# Patient Record
Sex: Female | Born: 1940 | Race: White | Hispanic: No | Marital: Married | Smoking: Never smoker
Health system: Southern US, Community
[De-identification: ages and names within clinical notes are randomized; demographics above are authoritative.]

## PROBLEM LIST (undated history)

## (undated) ENCOUNTER — Ambulatory Visit: Discharge status: Absent without leave | Payer: PPO

## (undated) DIAGNOSIS — E7800 Pure hypercholesterolemia, unspecified: Secondary | ICD-10-CM

## (undated) DIAGNOSIS — I739 Peripheral vascular disease, unspecified: Secondary | ICD-10-CM

## (undated) DIAGNOSIS — I779 Disorder of arteries and arterioles, unspecified: Secondary | ICD-10-CM

## (undated) DIAGNOSIS — E041 Nontoxic single thyroid nodule: Secondary | ICD-10-CM

## (undated) DIAGNOSIS — K219 Gastro-esophageal reflux disease without esophagitis: Secondary | ICD-10-CM

## (undated) DIAGNOSIS — I2510 Atherosclerotic heart disease of native coronary artery without angina pectoris: Secondary | ICD-10-CM

## (undated) DIAGNOSIS — C439 Malignant melanoma of skin, unspecified: Secondary | ICD-10-CM

## (undated) DIAGNOSIS — N3010 Interstitial cystitis (chronic) without hematuria: Secondary | ICD-10-CM

## (undated) DIAGNOSIS — Z87442 Personal history of urinary calculi: Secondary | ICD-10-CM

## (undated) DIAGNOSIS — R9431 Abnormal electrocardiogram [ECG] [EKG]: Secondary | ICD-10-CM

## (undated) DIAGNOSIS — R9430 Abnormal result of cardiovascular function study, unspecified: Secondary | ICD-10-CM

## (undated) DIAGNOSIS — K5730 Diverticulosis of large intestine without perforation or abscess without bleeding: Secondary | ICD-10-CM

## (undated) DIAGNOSIS — R51 Headache: Secondary | ICD-10-CM

## (undated) DIAGNOSIS — R011 Cardiac murmur, unspecified: Secondary | ICD-10-CM

## (undated) DIAGNOSIS — M1990 Unspecified osteoarthritis, unspecified site: Secondary | ICD-10-CM

## (undated) DIAGNOSIS — G4733 Obstructive sleep apnea (adult) (pediatric): Secondary | ICD-10-CM

## (undated) DIAGNOSIS — I059 Rheumatic mitral valve disease, unspecified: Secondary | ICD-10-CM

## (undated) DIAGNOSIS — N879 Dysplasia of cervix uteri, unspecified: Secondary | ICD-10-CM

## (undated) DIAGNOSIS — I350 Nonrheumatic aortic (valve) stenosis: Secondary | ICD-10-CM

## (undated) DIAGNOSIS — L900 Lichen sclerosus et atrophicus: Secondary | ICD-10-CM

## (undated) DIAGNOSIS — G43909 Migraine, unspecified, not intractable, without status migrainosus: Secondary | ICD-10-CM

## (undated) DIAGNOSIS — J411 Mucopurulent chronic bronchitis: Secondary | ICD-10-CM

## (undated) DIAGNOSIS — N200 Calculus of kidney: Secondary | ICD-10-CM

## (undated) DIAGNOSIS — J189 Pneumonia, unspecified organism: Secondary | ICD-10-CM

## (undated) DIAGNOSIS — R079 Chest pain, unspecified: Secondary | ICD-10-CM

## (undated) DIAGNOSIS — I872 Venous insufficiency (chronic) (peripheral): Secondary | ICD-10-CM

## (undated) DIAGNOSIS — I10 Essential (primary) hypertension: Secondary | ICD-10-CM

## (undated) DIAGNOSIS — I4891 Unspecified atrial fibrillation: Secondary | ICD-10-CM

## (undated) HISTORY — DX: Pure hypercholesterolemia, unspecified: E78.00

## (undated) HISTORY — PX: CARDIAC CATHETERIZATION: SHX172

## (undated) HISTORY — PX: NASAL SEPTUM SURGERY: SHX37

## (undated) HISTORY — DX: Chest pain, unspecified: R07.9

## (undated) HISTORY — DX: Dysplasia of cervix uteri, unspecified: N87.9

## (undated) HISTORY — DX: Rheumatic mitral valve disease, unspecified: I05.9

## (undated) HISTORY — DX: Mucopurulent chronic bronchitis: J41.1

## (undated) HISTORY — DX: Cardiac murmur, unspecified: R01.1

## (undated) HISTORY — DX: Essential (primary) hypertension: I10

## (undated) HISTORY — DX: Interstitial cystitis (chronic) without hematuria: N30.10

## (undated) HISTORY — DX: Disorder of arteries and arterioles, unspecified: I77.9

## (undated) HISTORY — DX: Calculus of kidney: N20.0

## (undated) HISTORY — DX: Migraine, unspecified, not intractable, without status migrainosus: G43.909

## (undated) HISTORY — DX: Nonrheumatic aortic (valve) stenosis: I35.0

## (undated) HISTORY — DX: Abnormal electrocardiogram (ECG) (EKG): R94.31

## (undated) HISTORY — DX: Malignant melanoma of skin, unspecified: C43.9

## (undated) HISTORY — DX: Venous insufficiency (chronic) (peripheral): I87.2

## (undated) HISTORY — PX: BREAST CYST ASPIRATION: SHX578

## (undated) HISTORY — DX: Unspecified osteoarthritis, unspecified site: M19.90

## (undated) HISTORY — DX: Lichen sclerosus et atrophicus: L90.0

## (undated) HISTORY — DX: Diverticulosis of large intestine without perforation or abscess without bleeding: K57.30

## (undated) HISTORY — DX: Unspecified atrial fibrillation: I48.91

## (undated) HISTORY — DX: Nontoxic single thyroid nodule: E04.1

## (undated) HISTORY — DX: Obstructive sleep apnea (adult) (pediatric): G47.33

## (undated) HISTORY — DX: Abnormal result of cardiovascular function study, unspecified: R94.30

## (undated) HISTORY — PX: CATARACT EXTRACTION, BILATERAL: SHX1313

## (undated) HISTORY — DX: Gastro-esophageal reflux disease without esophagitis: K21.9

## (undated) HISTORY — DX: Peripheral vascular disease, unspecified: I73.9

## (undated) HISTORY — DX: Headache: R51

---

## 1969-11-04 DIAGNOSIS — N879 Dysplasia of cervix uteri, unspecified: Secondary | ICD-10-CM

## 1969-11-04 HISTORY — PX: GYNECOLOGIC CRYOSURGERY: SHX857

## 1969-11-04 HISTORY — DX: Dysplasia of cervix uteri, unspecified: N87.9

## 1999-04-10 ENCOUNTER — Other Ambulatory Visit: Admission: RE | Admit: 1999-04-10 | Discharge: 1999-04-10 | Payer: Self-pay | Admitting: Gynecology

## 2000-07-08 ENCOUNTER — Other Ambulatory Visit: Admission: RE | Admit: 2000-07-08 | Discharge: 2000-07-08 | Payer: Self-pay | Admitting: Gynecology

## 2001-07-27 ENCOUNTER — Other Ambulatory Visit: Admission: RE | Admit: 2001-07-27 | Discharge: 2001-07-27 | Payer: Self-pay | Admitting: Gynecology

## 2002-06-03 ENCOUNTER — Encounter: Payer: Self-pay | Admitting: Pulmonary Disease

## 2002-06-03 ENCOUNTER — Ambulatory Visit (HOSPITAL_COMMUNITY): Admission: RE | Admit: 2002-06-03 | Discharge: 2002-06-03 | Payer: Self-pay | Admitting: Pulmonary Disease

## 2002-11-04 HISTORY — PX: OTHER SURGICAL HISTORY: SHX169

## 2002-11-08 ENCOUNTER — Other Ambulatory Visit: Admission: RE | Admit: 2002-11-08 | Discharge: 2002-11-08 | Payer: Self-pay | Admitting: Gynecology

## 2002-12-09 ENCOUNTER — Encounter: Payer: Self-pay | Admitting: Orthopedic Surgery

## 2002-12-16 ENCOUNTER — Inpatient Hospital Stay (HOSPITAL_COMMUNITY): Admission: RE | Admit: 2002-12-16 | Discharge: 2002-12-20 | Payer: Self-pay | Admitting: Orthopedic Surgery

## 2002-12-16 ENCOUNTER — Encounter: Payer: Self-pay | Admitting: Orthopedic Surgery

## 2004-03-05 ENCOUNTER — Other Ambulatory Visit: Admission: RE | Admit: 2004-03-05 | Discharge: 2004-03-05 | Payer: Self-pay | Admitting: Gynecology

## 2004-07-08 ENCOUNTER — Encounter: Payer: Self-pay | Admitting: Cardiology

## 2004-10-02 ENCOUNTER — Ambulatory Visit: Payer: Self-pay | Admitting: Pulmonary Disease

## 2004-10-05 ENCOUNTER — Ambulatory Visit: Payer: Self-pay | Admitting: Pulmonary Disease

## 2004-10-10 ENCOUNTER — Ambulatory Visit: Payer: Self-pay | Admitting: Pulmonary Disease

## 2004-10-19 ENCOUNTER — Ambulatory Visit: Payer: Self-pay | Admitting: Pulmonary Disease

## 2004-11-04 DIAGNOSIS — C439 Malignant melanoma of skin, unspecified: Secondary | ICD-10-CM

## 2004-11-04 HISTORY — DX: Malignant melanoma of skin, unspecified: C43.9

## 2004-11-04 HISTORY — PX: OTHER SURGICAL HISTORY: SHX169

## 2004-11-06 ENCOUNTER — Ambulatory Visit: Payer: Self-pay | Admitting: Adult Health

## 2004-11-14 ENCOUNTER — Ambulatory Visit: Payer: Self-pay | Admitting: Pulmonary Disease

## 2005-01-07 ENCOUNTER — Ambulatory Visit: Payer: Self-pay | Admitting: Pulmonary Disease

## 2005-03-12 ENCOUNTER — Other Ambulatory Visit: Admission: RE | Admit: 2005-03-12 | Discharge: 2005-03-12 | Payer: Self-pay | Admitting: Gynecology

## 2005-05-27 ENCOUNTER — Ambulatory Visit: Payer: Self-pay | Admitting: Pulmonary Disease

## 2005-08-28 ENCOUNTER — Ambulatory Visit: Payer: Self-pay | Admitting: Pulmonary Disease

## 2005-11-11 ENCOUNTER — Ambulatory Visit: Payer: Self-pay | Admitting: Pulmonary Disease

## 2005-12-27 ENCOUNTER — Ambulatory Visit: Payer: Self-pay | Admitting: Pulmonary Disease

## 2006-01-03 ENCOUNTER — Ambulatory Visit: Payer: Self-pay | Admitting: Pulmonary Disease

## 2006-02-02 HISTORY — PX: OTHER SURGICAL HISTORY: SHX169

## 2006-02-21 ENCOUNTER — Ambulatory Visit (HOSPITAL_BASED_OUTPATIENT_CLINIC_OR_DEPARTMENT_OTHER): Admission: RE | Admit: 2006-02-21 | Discharge: 2006-02-21 | Payer: Self-pay | Admitting: Urology

## 2006-04-14 ENCOUNTER — Ambulatory Visit: Payer: Self-pay | Admitting: Gastroenterology

## 2006-04-29 ENCOUNTER — Ambulatory Visit: Payer: Self-pay | Admitting: Gastroenterology

## 2006-04-29 ENCOUNTER — Encounter (INDEPENDENT_AMBULATORY_CARE_PROVIDER_SITE_OTHER): Payer: Self-pay | Admitting: Specialist

## 2006-06-24 ENCOUNTER — Other Ambulatory Visit: Admission: RE | Admit: 2006-06-24 | Discharge: 2006-06-24 | Payer: Self-pay | Admitting: Gynecology

## 2006-06-25 ENCOUNTER — Ambulatory Visit: Payer: Self-pay | Admitting: Pulmonary Disease

## 2006-07-28 ENCOUNTER — Ambulatory Visit: Payer: Self-pay | Admitting: Gastroenterology

## 2006-08-19 ENCOUNTER — Ambulatory Visit: Payer: Self-pay | Admitting: Pulmonary Disease

## 2006-12-04 ENCOUNTER — Ambulatory Visit: Payer: Self-pay | Admitting: Pulmonary Disease

## 2007-01-05 ENCOUNTER — Ambulatory Visit: Payer: Self-pay | Admitting: Pulmonary Disease

## 2007-01-20 ENCOUNTER — Encounter: Admission: RE | Admit: 2007-01-20 | Discharge: 2007-01-20 | Payer: Self-pay | Admitting: Orthopedic Surgery

## 2007-01-29 ENCOUNTER — Ambulatory Visit: Payer: Self-pay | Admitting: Pulmonary Disease

## 2007-01-29 LAB — CONVERTED CEMR LAB
AST: 20 units/L (ref 0–37)
Albumin: 3.7 g/dL (ref 3.5–5.2)
BUN: 18 mg/dL (ref 6–23)
Basophils Absolute: 0.1 10*3/uL (ref 0.0–0.1)
Chloride: 104 meq/L (ref 96–112)
Cholesterol: 268 mg/dL (ref 0–200)
Direct LDL: 167.5 mg/dL
Eosinophils Absolute: 0.2 10*3/uL (ref 0.0–0.6)
GFR calc non Af Amer: 89 mL/min
HCT: 40.8 % (ref 36.0–46.0)
HDL: 95.5 mg/dL (ref >39.0)
MCHC: 34.3 g/dL (ref 30.0–36.0)
MCV: 87.3 fL (ref 78.0–100.0)
Monocytes Relative: 6.9 % (ref 3.0–11.0)
Neutrophils Relative %: 63.5 % (ref 43.0–77.0)
Platelets: 298 10*3/uL (ref 150–400)
Potassium: 4.6 meq/L (ref 3.5–5.1)
RBC: 4.67 M/uL (ref 3.87–5.11)
Sodium: 141 meq/L (ref 135–145)
TSH: 2.07 microintl units/mL (ref 0.35–5.50)
Triglycerides: 35 mg/dL (ref 0–149)

## 2007-03-16 ENCOUNTER — Ambulatory Visit: Payer: Self-pay | Admitting: Gastroenterology

## 2007-03-18 ENCOUNTER — Ambulatory Visit (HOSPITAL_COMMUNITY): Admission: RE | Admit: 2007-03-18 | Discharge: 2007-03-18 | Payer: Self-pay | Admitting: Gastroenterology

## 2007-04-07 ENCOUNTER — Ambulatory Visit: Payer: Self-pay | Admitting: Gastroenterology

## 2007-07-07 LAB — CONVERTED CEMR LAB
Bilirubin, Direct: 0.1 mg/dL (ref 0.0–0.3)
CO2: 29 meq/L (ref 19–32)
GFR calc Af Amer: 107 mL/min
Glucose, Bld: 109 mg/dL — ABNORMAL HIGH (ref 70–99)
HDL: 73.3 mg/dL (ref >39.0)
Potassium: 4.5 meq/L (ref 3.5–5.1)
Total Protein: 6.7 g/dL (ref 6.0–8.3)
Triglycerides: 37 mg/dL (ref 0–149)

## 2007-07-20 ENCOUNTER — Ambulatory Visit: Payer: Self-pay | Admitting: Pulmonary Disease

## 2007-07-20 LAB — CONVERTED CEMR LAB
ALT: 30 units/L (ref 0–35)
Alkaline Phosphatase: 68 units/L (ref 39–117)
CO2: 29 meq/L (ref 19–32)
Cholesterol: 212 mg/dL (ref 0–200)
GFR calc Af Amer: 107 mL/min
GFR calc non Af Amer: 89 mL/min
HDL: 73.3 mg/dL (ref >39.0)
Potassium: 4.5 meq/L (ref 3.5–5.1)
Total Protein: 6.7 g/dL (ref 6.0–8.3)

## 2007-07-23 ENCOUNTER — Ambulatory Visit: Payer: Self-pay | Admitting: Pulmonary Disease

## 2007-07-29 ENCOUNTER — Encounter: Payer: Self-pay | Admitting: Pulmonary Disease

## 2007-07-29 ENCOUNTER — Ambulatory Visit: Payer: Self-pay | Admitting: Internal Medicine

## 2007-09-09 ENCOUNTER — Ambulatory Visit: Payer: Self-pay | Admitting: Pulmonary Disease

## 2007-09-10 ENCOUNTER — Other Ambulatory Visit: Admission: RE | Admit: 2007-09-10 | Discharge: 2007-09-10 | Payer: Self-pay | Admitting: Gynecology

## 2008-02-09 DIAGNOSIS — E7800 Pure hypercholesterolemia, unspecified: Secondary | ICD-10-CM | POA: Insufficient documentation

## 2008-02-09 DIAGNOSIS — K219 Gastro-esophageal reflux disease without esophagitis: Secondary | ICD-10-CM | POA: Insufficient documentation

## 2008-02-09 DIAGNOSIS — M810 Age-related osteoporosis without current pathological fracture: Secondary | ICD-10-CM | POA: Insufficient documentation

## 2008-02-09 DIAGNOSIS — I10 Essential (primary) hypertension: Secondary | ICD-10-CM | POA: Insufficient documentation

## 2008-02-10 ENCOUNTER — Ambulatory Visit: Payer: Self-pay | Admitting: Pulmonary Disease

## 2008-02-10 DIAGNOSIS — J309 Allergic rhinitis, unspecified: Secondary | ICD-10-CM | POA: Insufficient documentation

## 2008-02-10 DIAGNOSIS — C439 Malignant melanoma of skin, unspecified: Secondary | ICD-10-CM | POA: Insufficient documentation

## 2008-02-10 DIAGNOSIS — N3010 Interstitial cystitis (chronic) without hematuria: Secondary | ICD-10-CM | POA: Insufficient documentation

## 2008-02-23 ENCOUNTER — Ambulatory Visit: Payer: Self-pay

## 2008-04-27 ENCOUNTER — Ambulatory Visit: Payer: Self-pay | Admitting: Pulmonary Disease

## 2008-04-27 ENCOUNTER — Ambulatory Visit: Payer: Self-pay | Admitting: Internal Medicine

## 2008-04-27 DIAGNOSIS — J45909 Unspecified asthma, uncomplicated: Secondary | ICD-10-CM | POA: Insufficient documentation

## 2008-04-28 LAB — CONVERTED CEMR LAB
BUN: 12 mg/dL (ref 6–23)
Basophils Relative: 0.9 % (ref 0.0–1.0)
CO2: 30 meq/L (ref 19–32)
Calcium: 9.6 mg/dL (ref 8.4–10.5)
Chloride: 102 meq/L (ref 96–112)
Creatinine, Ser: 0.7 mg/dL (ref 0.4–1.2)
Eosinophils Absolute: 0.1 10*3/uL (ref 0.0–0.7)
Eosinophils Relative: 1.9 % (ref 0.0–5.0)
Hemoglobin: 14 g/dL (ref 12.0–15.0)
Lymphocytes Relative: 34.8 % (ref 12.0–46.0)
MCV: 88.1 fL (ref 78.0–100.0)
Neutro Abs: 3.9 10*3/uL (ref 1.4–7.7)
Neutrophils Relative %: 54.6 % (ref 43.0–77.0)
RBC: 4.53 M/uL (ref 3.87–5.11)
WBC: 7 10*3/uL (ref 4.5–10.5)

## 2008-05-11 ENCOUNTER — Telehealth (INDEPENDENT_AMBULATORY_CARE_PROVIDER_SITE_OTHER): Payer: Self-pay | Admitting: *Deleted

## 2008-06-10 ENCOUNTER — Ambulatory Visit: Payer: Self-pay | Admitting: Cardiology

## 2008-06-17 ENCOUNTER — Ambulatory Visit: Payer: Self-pay

## 2008-06-17 ENCOUNTER — Encounter: Payer: Self-pay | Admitting: Cardiology

## 2008-07-05 ENCOUNTER — Ambulatory Visit: Payer: Self-pay | Admitting: Pulmonary Disease

## 2008-07-06 ENCOUNTER — Ambulatory Visit: Payer: Self-pay | Admitting: Cardiology

## 2008-07-08 ENCOUNTER — Ambulatory Visit: Payer: Self-pay | Admitting: Internal Medicine

## 2008-07-21 ENCOUNTER — Ambulatory Visit: Payer: Self-pay | Admitting: Pulmonary Disease

## 2008-09-08 ENCOUNTER — Telehealth: Payer: Self-pay | Admitting: Pulmonary Disease

## 2008-10-03 ENCOUNTER — Ambulatory Visit: Payer: Self-pay | Admitting: Pulmonary Disease

## 2008-10-06 ENCOUNTER — Ambulatory Visit: Payer: Self-pay | Admitting: Pulmonary Disease

## 2008-10-09 LAB — CONVERTED CEMR LAB
ALT: 20 units/L (ref 0–35)
AST: 19 units/L (ref 0–37)
Albumin: 3.6 g/dL (ref 3.5–5.2)
Alkaline Phosphatase: 55 units/L (ref 39–117)
BUN: 14 mg/dL (ref 6–23)
CO2: 31 meq/L (ref 19–32)
Chloride: 107 meq/L (ref 96–112)
Eosinophils Absolute: 0.1 10*3/uL (ref 0.0–0.7)
Eosinophils Relative: 2.2 % (ref 0.0–5.0)
GFR calc non Af Amer: 88 mL/min
Glucose, Bld: 100 mg/dL — ABNORMAL HIGH (ref 70–99)
HDL: 78.2 mg/dL (ref >39.0)
LDL Cholesterol: 93 mg/dL (ref 0–99)
Lymphocytes Relative: 29.6 % (ref 12.0–46.0)
Monocytes Relative: 7.4 % (ref 3.0–12.0)
Neutrophils Relative %: 59.4 % (ref 43.0–77.0)
Platelets: 230 10*3/uL (ref 150–400)
Potassium: 3.7 meq/L (ref 3.5–5.1)
Total CHOL/HDL Ratio: 2.3
VLDL: 8 mg/dL (ref 0–40)
WBC: 5.2 10*3/uL (ref 4.5–10.5)

## 2008-10-13 ENCOUNTER — Other Ambulatory Visit: Admission: RE | Admit: 2008-10-13 | Discharge: 2008-10-13 | Payer: Self-pay | Admitting: Gynecology

## 2008-10-13 ENCOUNTER — Ambulatory Visit: Payer: Self-pay | Admitting: Gynecology

## 2008-10-13 ENCOUNTER — Encounter: Payer: Self-pay | Admitting: Gynecology

## 2008-12-21 ENCOUNTER — Telehealth (INDEPENDENT_AMBULATORY_CARE_PROVIDER_SITE_OTHER): Payer: Self-pay | Admitting: *Deleted

## 2008-12-27 ENCOUNTER — Ambulatory Visit: Payer: Self-pay | Admitting: Internal Medicine

## 2008-12-27 ENCOUNTER — Inpatient Hospital Stay (HOSPITAL_COMMUNITY): Admission: AD | Admit: 2008-12-27 | Discharge: 2008-12-31 | Payer: Self-pay | Admitting: Pulmonary Disease

## 2008-12-27 ENCOUNTER — Ambulatory Visit: Payer: Self-pay | Admitting: Pulmonary Disease

## 2009-01-05 ENCOUNTER — Ambulatory Visit: Payer: Self-pay | Admitting: Pulmonary Disease

## 2009-01-11 ENCOUNTER — Encounter (INDEPENDENT_AMBULATORY_CARE_PROVIDER_SITE_OTHER): Payer: Self-pay | Admitting: *Deleted

## 2009-01-24 ENCOUNTER — Ambulatory Visit: Payer: Self-pay | Admitting: Pulmonary Disease

## 2009-01-31 ENCOUNTER — Encounter: Payer: Self-pay | Admitting: Pulmonary Disease

## 2009-02-14 ENCOUNTER — Ambulatory Visit: Payer: Self-pay | Admitting: Pulmonary Disease

## 2009-02-21 ENCOUNTER — Encounter: Payer: Self-pay | Admitting: Pulmonary Disease

## 2009-04-21 ENCOUNTER — Ambulatory Visit: Payer: Self-pay | Admitting: Cardiology

## 2009-04-28 ENCOUNTER — Encounter: Payer: Self-pay | Admitting: Pulmonary Disease

## 2009-06-02 ENCOUNTER — Ambulatory Visit: Payer: Self-pay | Admitting: Pulmonary Disease

## 2009-06-02 DIAGNOSIS — I872 Venous insufficiency (chronic) (peripheral): Secondary | ICD-10-CM | POA: Insufficient documentation

## 2009-06-21 ENCOUNTER — Ambulatory Visit: Payer: Self-pay | Admitting: Internal Medicine

## 2009-06-23 ENCOUNTER — Telehealth: Payer: Self-pay | Admitting: Pulmonary Disease

## 2009-06-23 ENCOUNTER — Ambulatory Visit: Payer: Self-pay | Admitting: Gastroenterology

## 2009-06-23 ENCOUNTER — Ambulatory Visit: Payer: Self-pay | Admitting: Internal Medicine

## 2009-06-29 ENCOUNTER — Telehealth: Payer: Self-pay | Admitting: Gastroenterology

## 2009-07-07 ENCOUNTER — Ambulatory Visit: Payer: Self-pay | Admitting: Gastroenterology

## 2009-07-29 ENCOUNTER — Encounter: Payer: Self-pay | Admitting: Cardiology

## 2009-08-02 ENCOUNTER — Ambulatory Visit: Payer: Self-pay | Admitting: Cardiovascular Disease

## 2009-08-02 ENCOUNTER — Encounter: Payer: Self-pay | Admitting: Cardiology

## 2009-08-02 ENCOUNTER — Ambulatory Visit: Payer: Self-pay

## 2009-08-07 ENCOUNTER — Encounter (INDEPENDENT_AMBULATORY_CARE_PROVIDER_SITE_OTHER): Payer: Self-pay | Admitting: *Deleted

## 2009-08-28 ENCOUNTER — Encounter: Admission: RE | Admit: 2009-08-28 | Discharge: 2009-08-28 | Payer: Self-pay | Admitting: Orthopedic Surgery

## 2009-09-14 ENCOUNTER — Ambulatory Visit: Payer: Self-pay | Admitting: Cardiology

## 2009-11-01 ENCOUNTER — Telehealth (INDEPENDENT_AMBULATORY_CARE_PROVIDER_SITE_OTHER): Payer: Self-pay | Admitting: *Deleted

## 2009-11-10 ENCOUNTER — Telehealth: Payer: Self-pay | Admitting: Pulmonary Disease

## 2009-11-10 ENCOUNTER — Ambulatory Visit: Payer: Self-pay | Admitting: Internal Medicine

## 2009-11-21 ENCOUNTER — Ambulatory Visit: Payer: Self-pay | Admitting: Gynecology

## 2009-11-21 ENCOUNTER — Other Ambulatory Visit: Admission: RE | Admit: 2009-11-21 | Discharge: 2009-11-21 | Payer: Self-pay | Admitting: Gynecology

## 2009-12-12 ENCOUNTER — Ambulatory Visit: Payer: Self-pay | Admitting: Pulmonary Disease

## 2009-12-29 ENCOUNTER — Ambulatory Visit: Payer: Self-pay | Admitting: Pulmonary Disease

## 2009-12-31 LAB — CONVERTED CEMR LAB
ALT: 21 units/L (ref 0–35)
BUN: 15 mg/dL (ref 6–23)
Basophils Relative: 1 % (ref 0.0–3.0)
Bilirubin, Direct: 0.1 mg/dL (ref 0.0–0.3)
Eosinophils Absolute: 0.1 10*3/uL (ref 0.0–0.7)
GFR calc non Af Amer: 105.01 mL/min (ref >60)
Glucose, Bld: 93 mg/dL (ref 70–99)
Hemoglobin: 13 g/dL (ref 12.0–15.0)
LDL Cholesterol: 83 mg/dL (ref 0–99)
MCHC: 33.3 g/dL (ref 30.0–36.0)
MCV: 91 fL (ref 78.0–100.0)
Monocytes Absolute: 0.5 10*3/uL (ref 0.1–1.0)
Neutro Abs: 2.7 10*3/uL (ref 1.4–7.7)
Potassium: 4.4 meq/L (ref 3.5–5.1)
RBC: 4.28 M/uL (ref 3.87–5.11)
Total Bilirubin: 0.5 mg/dL (ref 0.3–1.2)
VLDL: 8.4 mg/dL (ref 0.0–40.0)

## 2010-01-31 ENCOUNTER — Ambulatory Visit: Payer: Self-pay | Admitting: Pulmonary Disease

## 2010-02-13 ENCOUNTER — Encounter: Payer: Self-pay | Admitting: Pulmonary Disease

## 2010-03-08 ENCOUNTER — Encounter: Payer: Self-pay | Admitting: Adult Health

## 2010-03-08 ENCOUNTER — Ambulatory Visit: Payer: Self-pay | Admitting: Internal Medicine

## 2010-03-22 ENCOUNTER — Ambulatory Visit: Payer: Self-pay | Admitting: Pulmonary Disease

## 2010-04-06 ENCOUNTER — Ambulatory Visit: Payer: Self-pay | Admitting: Internal Medicine

## 2010-05-18 ENCOUNTER — Ambulatory Visit: Payer: Self-pay | Admitting: Internal Medicine

## 2010-05-18 ENCOUNTER — Ambulatory Visit: Payer: Self-pay | Admitting: Pulmonary Disease

## 2010-06-05 ENCOUNTER — Ambulatory Visit: Payer: Self-pay | Admitting: Cardiology

## 2010-06-11 ENCOUNTER — Ambulatory Visit: Payer: Self-pay | Admitting: Pulmonary Disease

## 2010-07-18 ENCOUNTER — Ambulatory Visit: Payer: Self-pay | Admitting: Pulmonary Disease

## 2010-07-30 LAB — CONVERTED CEMR LAB
ALT: 19 units/L (ref 0–35)
AST: 18 units/L (ref 0–37)
Bilirubin, Direct: 0.1 mg/dL (ref 0.0–0.3)
Chloride: 107 meq/L (ref 96–112)
Creatinine, Ser: 0.7 mg/dL (ref 0.4–1.2)
Potassium: 4.2 meq/L (ref 3.5–5.1)
Total Bilirubin: 0.5 mg/dL (ref 0.3–1.2)
Total CHOL/HDL Ratio: 3
VLDL: 8.4 mg/dL (ref 0.0–40.0)

## 2010-08-01 ENCOUNTER — Ambulatory Visit: Payer: Self-pay | Admitting: Pulmonary Disease

## 2010-09-10 ENCOUNTER — Ambulatory Visit: Payer: Self-pay | Admitting: Pulmonary Disease

## 2010-11-13 ENCOUNTER — Ambulatory Visit
Admission: RE | Admit: 2010-11-13 | Discharge: 2010-11-13 | Payer: Self-pay | Source: Home / Self Care | Attending: Pulmonary Disease | Admitting: Pulmonary Disease

## 2010-11-13 ENCOUNTER — Encounter: Payer: Self-pay | Admitting: Pulmonary Disease

## 2010-11-20 ENCOUNTER — Encounter: Payer: Self-pay | Admitting: Cardiology

## 2010-11-20 ENCOUNTER — Ambulatory Visit
Admission: RE | Admit: 2010-11-20 | Discharge: 2010-11-20 | Payer: Self-pay | Source: Home / Self Care | Attending: Cardiology | Admitting: Cardiology

## 2010-11-22 ENCOUNTER — Ambulatory Visit
Admission: RE | Admit: 2010-11-22 | Discharge: 2010-11-22 | Payer: Self-pay | Source: Home / Self Care | Attending: Gynecology | Admitting: Gynecology

## 2010-11-22 ENCOUNTER — Other Ambulatory Visit
Admission: RE | Admit: 2010-11-22 | Discharge: 2010-11-22 | Payer: Self-pay | Source: Home / Self Care | Admitting: Gynecology

## 2010-11-22 ENCOUNTER — Other Ambulatory Visit: Payer: Self-pay | Admitting: Gynecology

## 2010-12-04 ENCOUNTER — Encounter: Payer: Self-pay | Admitting: Cardiovascular Disease

## 2010-12-04 ENCOUNTER — Ambulatory Visit: Admission: RE | Admit: 2010-12-04 | Discharge: 2010-12-04 | Payer: Self-pay | Source: Home / Self Care

## 2010-12-04 NOTE — Assessment & Plan Note (Signed)
Summary: Acute NP office visit - asthma   Primary Provider/Referring Provider:  Kriste Basque  CC:  wheezing, increased SOB, fatigue/weakness, and prod cough with clear mucus .  History of Present Illness: 70 y/o WF with known history  of hyperlipidemia, GERD, DJD, HTN and Asthma. Has chronic dyspnea. Baseline activity - walks on treadmill 3x / week .        June 23, 2009 O/V c/o wheezing and shortness of breath last pm.  Reports she saw Rikki Spearing, NP on 8/18 for acute asthma exacerbation/cough.  Rx for prednisone taper and tessalon pearls for cough.  Reports feeling better during the day 8/19 but symptoms flared again last pm requiring an albuterol neb at 2100 and 0300.  She now reports symptom improvement--decreased wheezing and cough.  See ROS.   November 10, 2009 --Presents for an acute office visit. Complains of asthma flare with increased SOB, wheezing, prod cough that cleared since finishing zpak this past Sunday, sinus congestion with clear drainage, ears/eyes itching x2weeks. Wants to change brand name meds d/t cost.    January 31, 2010--Presents for an acute office visit. Complains of headache.   pt states she was shopping at a local store and a display item fell from a shelf onto her head 01-15-10, has been having HA's, tingling sensation and occasionally staggers when walking. When she bent down to look at piece of furniture, piece of decoration fell from above her on the back of head. She filed a report at the store. Since then she has soreness along where it hit her. She did not lose consciousness, no visual speech changes. HA started within 15 min after leaving store. Has had headache since then that comes and goes. Feeels lightheaded when she moves very slightly lasts for few seconds and resolbve. Their was no knot, break in skin .    Mar 08, 2010 Presents for an acute office visit. Complains increaed SOB, ,worse x 10 days,w/  wheezing,dry cough, drainage, tickle in throat, She is  leaving to go oot for sick relative. Worried this is going to get worse.   Mar 22, 2010--Returns for follow up. Last visit w/ asthma flare, tx w/ steroids  and rhinitis tx. She returns today feels better, not quite back to baseline. Has occasional wheeze. and dry cough. Does have some nocturnal reflux. Last visit we looked at IGE/rash which was neg for allergy profile but IGE elevated 130. We discussed Xolair. She has had 2 flare requiring steroids over last year. We decided for now to to stay on current regimen , and conisder xolair evaluation if she has additional flare ups. Denies chest pain, dyspnea, orthopnea, hemoptysis, fever, n/v/d, edema, headache.   April 06, 2010-Presents for persistent symptoms Complains of wheezing, increased SOB, fatigue/weakness, prod cough with clear mucus. She was seen couple of weeks ago w/ slow to resolve asthma flare. She did improve however over last couple of days cough is coming back w/ wheezing. Cough is mainly dry w/ no discolred mucus. SHe is worried b/c she is going to [place] in couple of weeks and does not want to be sick. Denies chest pain, orthopnea, hemoptysis, fever, n/v/d, edema, headache. recent xray review was w/ no sign of infection. Allergy profile did show elevated IGE. When she return may need to consider Xolair.   Preventive Screening-Counseling & Management  Alcohol-Tobacco     Smoking Status: never  Medications Prior to Update: 1)  Allergy Shot .... Weekly 2)  Allegra 180 Mg  Tabs (Fexofenadine Hcl) .... Take 1 Tab By Mouth Daily As Needed For Allergies.Marland KitchenMarland Kitchen 3)  Symbicort 160-4.5 Mcg/act Aero (Budesonide-Formoterol Fumarate) .... Inhale 2 Sprays Two Times A Day.Marland Kitchen 4)  Adult Aspirin Low Strength 81 Mg  Tbdp (Aspirin) .... Take 1 Tablet By Mouth Once A Day 5)  Norvasc 10 Mg  Tabs (Amlodipine Besylate) .... Take 1 Tablet By Mouth Once A Day 6)  Losartan Potassium 100 Mg Tabs (Losartan Potassium) .Marland Kitchen.. 1 By Mouth Once Daily 7)  Minipress 5 Mg  Caps  (Prazosin Hcl) .... Take One Tablet By Mouth Two Times A Day 8)  Lasix 40 Mg  Tabs (Furosemide) .... Take 1 Tablet By Mouth Once A Day 9)  Simvastatin 20 Mg Tabs (Simvastatin) .Marland Kitchen.. 1 By Mouth At Bedtime 10)  Co Q-10 100 Mg Caps (Coenzyme Q10) .... Take 1 Cap Daily... 11)  Prevacid 30 Mg  Cpdr (Lansoprazole) .... Take 1 Capsule By Mouth Twice Times A Day 12)  Elmiron 100 Mg  Caps (Pentosan Polysulfate Sodium) .... Take One Tablet By Mouth Three Times A Day 13)  Ddavp 0.2 Mg  Tabs (Desmopressin Acetate) .... Take 3 Tablets By Mouth At Bedtime 14)  Evista 60 Mg  Tabs (Raloxifene Hcl) .... Take 1 Tablet By Mouth Once A Day 15)  Calcium 1500 Mg Tabs (Calcium Carbonate) .... Take One Tablet By Mouth Once Daily. 16)  Multivitamins   Tabs (Multiple Vitamin) .... Take One Tablet By Mouth Once Daily. 17)  Vitamin C 500 Mg  Tabs (Ascorbic Acid) .... Take One Tablet By Mouth Twice Daily. 18)  Cvs Vit D 5000 High-Potency 5000 Unit Caps (Cholecalciferol) .... Take 1 Cap By Mouth Once Daily... 19)  Mag-Oxide 400 Mg Tabs (Magnesium Oxide) .... Take 1 Tab By Mouth Once Daily... 20)  Astepro 0.15 % Soln (Azelastine Hcl) .... 2 Puffs Two Times A Day 21)  Albuterol Sulfate (2.5 Mg/24ml) 0.083%  Nebu (Albuterol Sulfate) .... As Needed 22)  Proair Hfa 108 (90 Base) Mcg/act  Aers (Albuterol Sulfate) .... Inhale 2 Puffs Q 6h As Needed For Wheezing...  Current Medications (verified): 1)  Allergy Shot .... Weekly 2)  Allegra 180 Mg  Tabs (Fexofenadine Hcl) .... Take 1 Tab By Mouth Daily As Needed For Allergies.Marland KitchenMarland Kitchen 3)  Albuterol Sulfate (2.5 Mg/16ml) 0.083%  Nebu (Albuterol Sulfate) .... As Needed 4)  Symbicort 160-4.5 Mcg/act Aero (Budesonide-Formoterol Fumarate) .... Inhale 2 Sprays Two Times A Day.Marland Kitchen 5)  Proair Hfa 108 (90 Base) Mcg/act  Aers (Albuterol Sulfate) .... Inhale 2 Puffs Q 6h As Needed For Wheezing... 6)  Adult Aspirin Low Strength 81 Mg  Tbdp (Aspirin) .... Take 1 Tablet By Mouth Once A Day 7)  Norvasc 10 Mg   Tabs (Amlodipine Besylate) .... Take 1 Tablet By Mouth Once A Day 8)  Losartan Potassium 100 Mg Tabs (Losartan Potassium) .Marland Kitchen.. 1 By Mouth Once Daily 9)  Minipress 5 Mg  Caps (Prazosin Hcl) .... Take One Tablet By Mouth Two Times A Day 10)  Lasix 40 Mg  Tabs (Furosemide) .... Take 1 Tablet By Mouth Once A Day 11)  Simvastatin 20 Mg Tabs (Simvastatin) .Marland Kitchen.. 1 By Mouth At Bedtime 12)  Co Q-10 100 Mg Caps (Coenzyme Q10) .... Take 1 Cap Daily... 13)  Prevacid 30 Mg  Cpdr (Lansoprazole) .... Take 1 Capsule By Mouth Twice Times A Day 14)  Elmiron 100 Mg  Caps (Pentosan Polysulfate Sodium) .... Take One Tablet By Mouth Three Times A Day 15)  Ddavp 0.2 Mg  Tabs (  Desmopressin Acetate) .... Take 3 Tablets By Mouth At Bedtime 16)  Evista 60 Mg  Tabs (Raloxifene Hcl) .... Take 1 Tablet By Mouth Once A Day 17)  Calcium 1500 Mg Tabs (Calcium Carbonate) .... Take One Tablet By Mouth Once Daily. 18)  Multivitamins   Tabs (Multiple Vitamin) .... Take One Tablet By Mouth Once Daily. 19)  Vitamin C 500 Mg  Tabs (Ascorbic Acid) .... Take One Tablet By Mouth Twice Daily. 20)  Cvs Vit D 5000 High-Potency 5000 Unit Caps (Cholecalciferol) .... Take 1 Cap By Mouth Once Daily... 21)  Mag-Oxide 400 Mg Tabs (Magnesium Oxide) .... Take 1 Tab By Mouth Once Daily... 22)  Astepro 0.15 % Soln (Azelastine Hcl) .... 2 Puffs Two Times A Day  Allergies (verified): 1)  ! Phenobarbital 2)  ! Keflex 3)  ! Penicillin  Past History:  Past Medical History: Last updated: 12/12/2009  ALLERGIC RHINITIS (ICD-477.9) ASTHMA (ICD-493.90) BRONCHITIS, RECURRENT (ICD-491.9) HYPERTENSION (ICD-401.9) CHEST PAIN-UNSPECIFIED (ICD-786.50) ? 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) VENOUS INSUFFICIENCY (ICD-459.81) HYPERCHOLESTEROLEMIA (ICD-272.0) GERD (ICD-530.81) DIVERTICULOSIS OF COLON (ICD-562.10) INTERSTITIAL CYSTITIS (ICD-595.1) RENAL CALCULUS, HX OF (ICD-V13.01) DEGENERATIVE JOINT DISEASE (ICD-715.90) OSTEOPOROSIS  (ICD-733.00) HEADACHE (ICD-784.0) Hx of MALIGNANT MELANOMA (ICD-172.9)  Past Surgical History: Last updated: 12/12/2009 S/P left THR 2/04 by DrGioffre S/P melanoma removed from medial left knee area by [facility] in 2006 S/P cystoscopy & basket stone removal from right ureter 4/07 by DrPeterson  Family History: Last updated: 08-13-2009 Father died age 30 from pancreatic cancer Mother died age 59 w/ a heart attack 4 Siblings> 2 Brothers- one died age 77 w/ bile duct cancer 2 Sisters- one died age 87 w/ a heart attack 1Brother Atrial fibrillation  Social History: Last updated: 06/02/2009 Married 2 Children Never smoked Social alcohol Retired  Risk Factors: Smoking Status: never (04/06/2010)  Review of Systems      See HPI  Vital Signs:  Patient profile:   70 year old female Height:      62 inches Weight:      198.31 pounds BMI:     36.40 O2 Sat:      95 % on Room air Temp:     97.9 degrees F oral Pulse rate:   80 / minute BP sitting:   114 / 72  (left arm) Cuff size:   regular  Vitals Entered By: Boone Master CNA/MA (April 06, 2010 3:18 PM)  O2 Flow:  Room air CC: wheezing, increased SOB, fatigue/weakness, prod cough with clear mucus  Is Patient Diabetic? No Comments Medications reviewed with patient Daytime contact number verified with patient. Boone Master CNA/MA  April 06, 2010 3:18 PM    Physical Exam  Additional Exam:  WD, Overweight, 70 y/o WF in NAD GENERAL:  Alert & oriented; pleasant & cooperative HEENT:  [place]/AT, EOM-wnl, PERRLA, EACs-clear,  NOSE-clear, THROAT-no erythema or exudate NECK:  Supple w/ fairROM; no JVD; normal carotid impulses w/o bruits; no lymphadenopathy. CHEST:  Coarse BS w/ faint exp wheeze on forced exp  HEART:  Regular Rhythm; without murmurs/ rubs/ or gallop ABDOMEN:  Obese, soft & nontender; normal bowel sounds EXT: without deformities or edema     Impression & Recommendations:  Problem # 1:  BRONCHITIS, RECURRENT  (ICD-491.9)  Recurrent asthmatic flare. Does not appear to have active infection at this time.  REc:  Prednisone taper over next week.  Continue on Astepro 2 puffs two times a day  Continue on saline nasal rinses as needed   Mucinex DM two times a  day as needed cough Hydromet as needed  I have given you a prescription to have on hold for zpack and steroid taper while you are on vacation if you start to have discolored mucus, cough and/or wheezing.  follow up Dr. Kriste Basque as scheduled.  Please contact office for sooner follow up if symptoms do not improve or worsen   Orders: Est. Patient Level IV (16109)  Medications Added to Medication List This Visit: 1)  Zithromax Z-pak 250 Mg Tabs (Azithromycin) .... Take as directed. 2)  Prednisone 10 Mg Tabs (Prednisone) .... 4 tabs for 2 days, then 3 tabs for 2 days, 2 tabs for 2 days, then 1 tab for 2 days, then stop 3)  Prednisone 10 Mg Tabs (Prednisone) .... 4 tabs for 3 days, then 3 tabs for 3 days, 2 tabs for 3 days, then 1 tab for 3 days, then stop  Complete Medication List: 1)  Allergy Shot  .... Weekly 2)  Allegra 180 Mg Tabs (Fexofenadine hcl) .... Take 1 tab by mouth daily as needed for allergies.Marland KitchenMarland Kitchen 3)  Symbicort 160-4.5 Mcg/act Aero (Budesonide-formoterol fumarate) .... Inhale 2 sprays two times a day.Marland Kitchen 4)  Adult Aspirin Low Strength 81 Mg Tbdp (Aspirin) .... Take 1 tablet by mouth once a day 5)  Norvasc 10 Mg Tabs (Amlodipine besylate) .... Take 1 tablet by mouth once a day 6)  Losartan Potassium 100 Mg Tabs (Losartan potassium) .Marland Kitchen.. 1 by mouth once daily 7)  Minipress 5 Mg Caps (Prazosin hcl) .... Take one tablet by mouth two times a day 8)  Lasix 40 Mg Tabs (Furosemide) .... Take 1 tablet by mouth once a day 9)  Simvastatin 20 Mg Tabs (Simvastatin) .Marland Kitchen.. 1 by mouth at bedtime 10)  Co Q-10 100 Mg Caps (Coenzyme q10) .... Take 1 cap daily... 11)  Prevacid 30 Mg Cpdr (Lansoprazole) .... Take 1 capsule by mouth twice times a day 12)   Elmiron 100 Mg Caps (Pentosan polysulfate sodium) .... Take one tablet by mouth three times a day 13)  Ddavp 0.2 Mg Tabs (Desmopressin acetate) .... Take 3 tablets by mouth at bedtime 14)  Evista 60 Mg Tabs (Raloxifene hcl) .... Take 1 tablet by mouth once a day 15)  Calcium 1500 Mg Tabs (Calcium carbonate) .... Take one tablet by mouth once daily. 16)  Multivitamins Tabs (Multiple vitamin) .... Take one tablet by mouth once daily. 17)  Vitamin C 500 Mg Tabs (Ascorbic acid) .... Take one tablet by mouth twice daily. 18)  Cvs Vit D 5000 High-potency 5000 Unit Caps (Cholecalciferol) .... Take 1 cap by mouth once daily... 19)  Mag-oxide 400 Mg Tabs (Magnesium oxide) .... Take 1 tab by mouth once daily... 20)  Astepro 0.15 % Soln (Azelastine hcl) .... 2 puffs two times a day 21)  Albuterol Sulfate (2.5 Mg/59ml) 0.083% Nebu (Albuterol sulfate) .... As needed 22)  Proair Hfa 108 (90 Base) Mcg/act Aers (Albuterol sulfate) .... Inhale 2 puffs q 6h as needed for wheezing... 23)  Zithromax Z-pak 250 Mg Tabs (Azithromycin) .... Take as directed. 24)  Prednisone 10 Mg Tabs (Prednisone) .... 4 tabs for 2 days, then 3 tabs for 2 days, 2 tabs for 2 days, then 1 tab for 2 days, then stop 25)  Prednisone 10 Mg Tabs (Prednisone) .... 4 tabs for 3 days, then 3 tabs for 3 days, 2 tabs for 3 days, then 1 tab for 3 days, then stop  Patient Instructions: 1)  Prednisone taper over next week.  2)  Continue on Astepro 2 puffs two times a day  3)  Continue on saline nasal rinses as needed  4)   Mucinex DM two times a day as needed cough 5)  Hydromet as needed  6)  I have given you a prescription to have on hold for zpack and steroid taper while you are on vacation if you start to have discolored mucus, cough and/or wheezing.  7)  follow up Dr. Kriste Basque as scheduled.  8)  Please contact office for sooner follow up if symptoms do not improve or worsen  9)    Prescriptions: PREDNISONE 10 MG TABS (PREDNISONE) 4 tabs for 3  days, then 3 tabs for 3 days, 2 tabs for 3 days, then 1 tab for 3 days, then stop  #30 x 0   Entered and Authorized by:   Rubye Oaks NP   Signed by:   Autum Benfer NP on 04/06/2010   Method used:   Electronically to        [facility]  [address] [id number] (retail)       [address]       [place], [place]  [address]       Ph: [phone] or [phone]       Fax: [phone]   RxID:   [id number] ZITHROMAX Z-PAK 250 MG TABS (AZITHROMYCIN) take as directed.  #1 x 0   Entered and Authorized by:   Rubye Oaks NP   Signed by:   Zhanae Proffit NP on 04/06/2010   Method used:   Print then Give to Patient   RxID:   [id number] PREDNISONE 10 MG TABS (PREDNISONE) 4 tabs for 2 days, then 3 tabs for 2 days, 2 tabs for 2 days, then 1 tab for 2 days, then stop  #20 x 0   Entered and Authorized by:   Rubye Oaks NP   Signed by:   Flavius Repsher NP on 04/06/2010   Method used:   Print then Give to Patient   RxID:   [id number] ZITHROMAX Z-PAK 250 MG TABS (AZITHROMYCIN) take as directed.  #1 x 0   Entered and Authorized by:   Rubye Oaks NP   Signed by:   Vaneza Pickart NP on 04/06/2010   Method used:   [facility]ically to        [facility]  [address] [id number] ([facility]ail[facility]       [address]       [place], [place]  [address]       Ph: [phone] or [phone]       Fax: [phone]   RxID:   [id number]

## 2010-12-04 NOTE — Assessment & Plan Note (Signed)
Summary: f/u 3 months///kp   Primary Care Provider:  Kriste Basque  CC:  3 month ROV & review....  History of Present Illness: 70 y/o WF here for a follow up visit... she has hx refractory asthmatic bronchitis requiring hosp & IV Solumedrol & max bronchodilator Rx etc... in the outpt setting she gets frequent courses of Prednisone & we have endeavored to maximize her inhaled regimen so as to keep her out of the hosp & off the oral steroids as much as poss... she also sees DrESL for allergy shots, and she has seen DrKozlow who felt that she had a major component of reflux causing her refractory airways dis...   ~  [facility] w/ refractory AB requiring IV Solumed/ Avelox, inhaled nebs, etc... disch on Pred, Nebs, Symbicort, Singulair, Mucinex, etc... slowly improved w/ max antireflux regimen added...    ~  [id number]:  she's had a busy 9mo- several minor exac treated by NPs & improved... had f/u colonoscopy 9/10 by DrStark- +divertics, otherw neg, he plans f/u 45yrs...  had right sided atyp CP & went to [facility]- then f/u [facility] 9/10 w/ norm EKG, & 2DEcho showed norm LV wall motion & thickness, EF= 60-65%, gr I DD noted, sl dil LA, right side OK... no change in meds, consider stress test, f/u 72yr...  she requests new nebulizer machine...   ~  June 11, 2010:  she continues w/ freq exac- "required Pred 4 times since 1/11"...using NEB w/ Brovana & Budes Bid, + Symbicort Bid as before (see below)... 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 PFT's today are essent WNL & she is reassured... incr anxiety due to husb's TKR & she's caring for him at home...   ~  September 10, 2010:  our plan was to maximize her inhaled regimen w/ Symbicort + Nebs using Budesonide & Brovana in an attempt to keep her off of Pred esp since her spirometry was essent normal & symptoms out of proportion... she hadn't required Pred since last OV w/ this regimen, but  recently was seen by DrESL to renew her allergy shots- and he stopped her nebs & put her on Pred again... I indicated that she had too many chefs in the kitchen & it was OK w/ me for DrLeBauer to take over her asthma management & she will check w/ me Q7mo for medical follow up... she is planning 50th anniv trip to [place] & requests ZPak for Prn use... OK Flu shot today.     Current Problems:   ALLERGIC RHINITIS (ICD-477.9) & ASTHMA (ICD-493.90) - she takes ALLEGRA 180mg /d, SYMBICORT 80- 2spBid, PROAIR & NEBS w/ BROVANA 15Bid & BUDESONIDE 0.25Bid (these were stopped by DrESL 10/11)... despite this she has freq exac requiring antibiotics and Pred... she still gets weekly shots from DrESL allergy clinic...  ~  CXR 6/09 was clear...   ~  CXR 2/10 hosp showed mild peribronch thickening, NAD...  ~  PFT 3/10 showed FVC=2.36 (90%), FEV1=1.78 (85%), %1sec=75, mid-flows= 57%pred.  ~  4/10: she saw DrKozlow for second opinion & Rx of her AB & GERD...  ~  CXR 2/11 showed clear lungs, NAD... f/u 7/11= same (clear, NAD).Marland Kitchen.  ~  RAST testing 5/11 showed IgE= 130, & neg rast tests x cockroach +low titer.  ~  CXR 7/11 showed mild cardiomeg, clear lungs, NAD.  ~  Sinus CT 8/11 showed no signif inflamm dis, mild membrane thickening, min ethmoid  changes on right.  ~  PFT 8/11 showed FVC=2.04 (71%), FEV1=1.58 (73%), %1sec=78, mid-flows= 70%pred.  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  ~  CTChest 2008 w/ 3mm nodule in RUL- unchanged over 63mo interval, small HH, noncalcif pleural plaques bilat... she is reassured w/ the recent CXR & serial films not showing nodule.  ~  no recent resp infections but she likes to keep ZPak handy just in case.  HYPERTENSION (ICD-401.9) & CHEST PAIN-UNSPECIFIED (ICD-786.50) - controlled on 4 meds:  NORVASC 10mg /d, LOSARTAN 100mg /d, MINIPRESS 5mg Bid, & LASIX 40mg /d... BP today = 130/84, and BP's at home are even better- 120's/ 60's per pt... denies HA, visual  changes, CP, palipit, dizziness, syncope, edema, etc...  ~  2DEcho 8/09 showed norm LV wall motion & EF= 60-65%, mild incr AoV thickness, mild LA dil & atrial septal aneurysm noted.  ~  EKG 9/10 by DrKatz- NSR, WNL...  ~  2DEcho 9/10 showed norm LV wall motion & thickness, EF= 60-65%, gr I DD noted, sl dil LA, right side OK...   ? 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 she had an abn lifeline screen 3/08 w/ left Carotid in the mild/mod range...  ~  CDopplers 4/09 showed mild smooth heterogen plaque in bulbs, 0-39% ICA stenoses...  VENOUS INSUFFICIENCY (ICD-459.81) - she knows to avoid sodium, elevate legs, wear support hose, continue the Lasix...  HYPERCHOLESTEROLEMIA (ICD-272.0) - on SIMVASTATIN 20mg /d + CoQ10 daily... prev intol to statins w/ severe leg cramps but the addition of the CoQ10 has made a world of difference to her tolerability...  ~  FLP 3/08 on diet alone showed TChol 268, TG 35, HDL 96, LDL 166...  ~  FLP 9/08 on Crestor 5mg /d showed TChol 212, TG 37, HDL 73, LDL 127... continue Rx.  ~  FLP 12/09 showed TChol 179, TG 38, HDL 78, LDL 93... continue same Rx.  ~  FLP 2/11 on Cres5 showed TChol 161, TG 42, HDL 70, LDL 83... pt switched to Simva20 per request.  GERD (ICD-530.81) - on PREVACID 30mg Bid + max antireflux regimen Qhs.  ~  last EGD 6/08 by DrSam showed 4cmHH, gastitis, and stricture- dilated... prev HPylori neg in 2007.  DIVERTICULOSIS OF COLON (ICD-562.10)  ~  colonoscopy 3/05 by DrSam showed divertics only... f/u planned 21yrs (+fam hx in uncle).  ~  colonoscopy 9/10 by DrStark showed divertics only... he plans f/u 23yrs.  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 she has had DMSO in bladder + taking- ELMIRON 100mg Tid, & DDAVP 0.2mg -3tabsHS (off prev Vesicare)... last DMSO treatment  ~5/11 per pt.  RENAL CALCULUS, HX OF (ICD-V13.01) - required cysto & basket stone removal from right ureter in 2007...  DEGENERATIVE JOINT  DISEASE (ICD-715.90) - she is s/p left THR by DrGioffre in 2004...  OSTEOPOROSIS (ICD-733.00) - on EVISTA 60mg /d... + calcium & MVI...  ~  labs 12/09 showed Vit D level = 34... advised to start Vit D OTC 1-2,000 u daily...  HEADACHE (ICD-784.0) - prev eval at the Headache clinic- 1994 by DrSpillman, and 2003 by DrFreeman...  Hx of MALIGNANT MELANOMA (ICD-172.9) - removed by [facility] in 2006 from medial left knee area...  Health Maintenance - she gets the seasonal Flu vaccine yearly... given f/u PNEUMOVAX at age 41 in 2009...   Preventive Screening-Counseling & Management  Alcohol-Tobacco     Smoking Status: never  Allergies: 1)  ! Phenobarbital 2)  ! Keflex 3)  ! Penicillin  Comments:  Nurse/Medical Assistant: The  patient's medications and allergies were reviewed with the patient and were updated in the Medication and Allergy Lists.  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 in 2006 S/P cystoscopy & basket stone removal from right ureter 4/07 by DrPeterson  Family History: Reviewed history from 06/11/2010 and no changes required. Father died age 48 from pancreatic cancer Mother died age 60 w/ a heart attack 4 Siblings> 2 Brothers- one died age 66 w/ bile duct cancer 2 Sisters- one died age 26 w/ a heart attack 1Brother Atrial fibrillation  Social  History: Reviewed history from 06/11/2010 and no changes required. Married 2 Children Never smoked Social alcohol Retired  Review of Systems      See HPI       The patient complains of dyspnea on exertion.  The patient denies anorexia, fever, weight loss, weight gain, vision loss, decreased hearing, hoarseness, chest pain, syncope, peripheral edema, prolonged cough, headaches, hemoptysis, abdominal pain, melena, hematochezia, severe indigestion/heartburn, hematuria, incontinence, muscle weakness, suspicious skin lesions, transient blindness, difficulty walking, depression, unusual weight change, abnormal bleeding, enlarged lymph nodes, and angioedema.    Vital Signs:  Patient profile:   70 year old female Height:      62 inches Weight:      196 pounds O2 Sat:      98 % on Room air Temp:     97.7 degrees F oral Pulse rate:   70 / minute BP sitting:   130 / 84  (left arm) Cuff size:   large  Vitals Entered By: Randell Loop CMA (September 10, 2010 2:39 PM)  O2 Sat at Rest %:  98 O2 Flow:  Room air CC: 3 month ROV & review... Is Patient Diabetic? No Pain Assessment Patient in pain? yes      Onset of pain  mid right lobe pain in her back that comes and goes---had some cp this am Comments meds updated today with pt   Physical Exam  Additional Exam:  WD, Overweight, 70 y/o WF in NAD... GENERAL:  Alert & oriented; pleasant & cooperative... HEENT:  [place]/AT, EOM-wnl, PERRLA, EACs-clear, TMs- some wax, NOSE-clear, THROAT- resolved, clear now. NECK:  Supple w/ fairROM; no JVD; normal carotid impulses w/o bruits; no thyromegaly or nodules palpated; no lymphadenopathy. CHEST:  essentially clear now- without wheezing, rales, or signs of consolidation... min forced end-exp wheeze. HEART:  Regular Rhythm; without murmurs/ rubs/ or gallops heard... ABDOMEN:  Obese, soft & nontender; normal bowel sounds; no organomegaly or masses palpated... EXT: without deformities, mild arthritic changes;  no varicose veins/ +venous insuffic/ tr edema. NEURO:  CN's intact;  no focal neuro deficits... DERM:  No lesions noted; no rash etc...    Impression & Recommendations:  Problem # 1:  ASTHMA (ICD-493.90) She informed me of the recent changes in Rx from DrESL- stopped nebs, placed back on Pred taper, etc... her chest is clear & I have asked her to f/u w/ him for asthma management & see me for routine ROV in 107mo w/ fasting labs etc... Her updated medication list for this problem includes:    [place]  15 Mcg/71ml Nebu (Arformoterol tartrate) .Marland Kitchen... 1 vial via hhn two times a day    Budesonide 0.25 Mg/65ml Susp (Budesonide) .Marland Kitchen... 1 vial via hhn two times a day    Symbicort 80-4.5 Mcg/act Aero (Budesonide-formoterol fumarate) .Marland Kitchen... 2 puffs two times a day    Proair Hfa 108 (90 Base) Mcg/act Aers (Albuterol sulfate) ..... Inhale 2 puffs q 6h as needed for wheezing...    Albuterol Sulfate (2.5 Mg/6ml) 0.083% Nebu (Albuterol sulfate) ..... Use one vial in neb three times a day as needed  Problem # 2:  CHEST PAIN, ATYPICAL, HX OF (ICD-V15.89) She has seen DrKatz for Cards & will f/u w/ him as needed...  Problem # 3:  HYPERTENSION (ICD-401.9) BP controlled>  continue same meds. Her updated medication list for this problem includes:    Norvasc 10 Mg Tabs (Amlodipine besylate) .Marland Kitchen... Take 1 tablet by mouth once a day    Losartan Potassium 100 Mg Tabs (Losartan potassium) .Marland Kitchen... 1 by mouth once daily    Minipress 5 Mg Caps (Prazosin hcl) .Marland Kitchen... Take one tablet by mouth two times a day    Lasix 40 Mg Tabs (Furosemide) .Marland Kitchen... Take 1 tablet by mouth once a day  Problem # 4:  HYPERCHOLESTEROLEMIA (ICD-272.0) Continue Simva20>  we will recheck FLP w/ 1st visit in 2012. Her updated medication list for this problem includes:    Simvastatin 20 Mg Tabs (Simvastatin) .Marland Kitchen... 1 by mouth at bedtime  Problem # 5:  GERD (ICD-530.81) She will maintain max antireflux regimen so as to minimize it's effect on her  RADS. Her updated medication list for this problem includes:    Prevacid 30 Mg Cpdr (Lansoprazole) .Marland Kitchen... Take 1 capsule by mouth twice times a day    Pepcid Ac Maximum Strength 20 Mg Tabs (Famotidine) .Marland Kitchen... Take 1 tablet by mouth once a day as needed    Mag-oxide 400 Mg Tabs (Magnesium oxide) .Marland Kitchen... Take 1 tab by mouth once daily...  Problem # 6:  OSTEOPOROSIS (ICD-733.00) She will need a f/u BMD study in light of all the Pred she takes... Her updated medication list for this problem includes:    Evista 60 Mg Tabs (Raloxifene hcl) .Marland Kitchen... Take 1 tablet by mouth once a day  Problem # 7:  OTHER MEDICAL PROBLEMS AS NOTED>>> OK Flu vaccine...  Complete Medication List: 1)  Allergy Shot  .... Weekly 2)  Allegra 180 Mg Tabs (Fexofenadine hcl) .... Take 1 tab by mouth daily as needed for allergies.Marland KitchenMarland Kitchen 3)  Astepro 0.15 % Soln (Azelastine hcl) .... 2 puffs two times a day 4)  Brovana 15 Mcg/11ml Nebu (Arformoterol tartrate) .Marland Kitchen.. 1 vial via hhn two times a day 5)  Budesonide 0.25 Mg/54ml Susp (Budesonide) .Marland Kitchen.. 1 vial via hhn two times a day 6)  Symbicort 80-4.5 Mcg/act Aero (Budesonide-formoterol fumarate) .... 2 puffs two times a day 7)  Proair Hfa 108 (90 Base) Mcg/act Aers (Albuterol sulfate) .... Inhale 2 puffs q 6h as needed for wheezing... 8)  Adult Aspirin Low Strength 81 Mg Tbdp (Aspirin) .... Take 1 tablet by mouth once a day 9)  Norvasc 10 Mg Tabs (Amlodipine besylate) .... Take 1 tablet by mouth once a day 10)  Losartan Potassium 100 Mg Tabs (Losartan potassium) .Marland Kitchen.. 1 by mouth once daily 11)  Minipress 5 Mg Caps (Prazosin hcl) .... Take one tablet by mouth two times a day 12)  Lasix 40 Mg Tabs (Furosemide) .... Take 1 tablet by mouth once a day 13)  Simvastatin 20  Mg Tabs (Simvastatin) .Marland Kitchen.. 1 by mouth at bedtime 14)  Co Q-10 100 Mg Caps (Coenzyme q10) .... Take 1 cap daily... 15)  Prevacid 30 Mg Cpdr (Lansoprazole) .... Take 1 capsule by mouth twice times a day 16)  Pepcid Ac Maximum  Strength 20 Mg Tabs (Famotidine) .... Take 1 tablet by mouth once a day as needed 17)  Elmiron 100 Mg Caps (Pentosan polysulfate sodium) .... Take one tablet by mouth three times a day 18)  Ddavp 0.2 Mg Tabs (Desmopressin acetate) .... Take 3 tablets by mouth at bedtime 19)  Evista 60 Mg Tabs (Raloxifene hcl) .... Take 1 tablet by mouth once a day 20)  Calcium 1500 Mg Tabs (Calcium carbonate) .... Take one tablet by mouth once daily. 21)  Mag-oxide 400 Mg Tabs (Magnesium oxide) .... Take 1 tab by mouth once daily... 22)  Multivitamins Tabs (Multiple vitamin) .... Take one tablet by mouth once daily. 23)  Vitamin C 500 Mg Tabs (Ascorbic acid) .... Take one tablet by mouth twice daily. 24)  Cvs Vit D 5000 High-potency 5000 Unit Caps (Cholecalciferol) .... Take 1 cap by mouth once daily... 25)  Hydromet 5-1.5 Mg/70ml Syrp (Hydrocodone-homatropine) .... Take one teaspoon by mouth every 6 hours as needed for cough 26)  Albuterol Sulfate (2.5 Mg/74ml) 0.083% Nebu (Albuterol sulfate) .... Use one vial in neb three times a day as needed 27)  Zithromax Z-pak 250 Mg Tabs (Azithromycin) .... Take as directed...  Patient Instructions: 1)  Today we updated your med list- see below.... 2)  We wrote a new perscription for the ZPak to use as needed for infection.Marland KitchenMarland Kitchen 3)  Continue on your present asthma program as outlined for you by DrLeBauer, and continue to follow up w/ him... 4)  Call if I can be of assistance in any way, & have a great time in [place]!!! 5)  Please schedule a follow-up appointment in 6 months. Prescriptions: PREVACID 30 MG  CPDR (LANSOPRAZOLE) Take 1 capsule by mouth twice times a day  #180 x 3   Entered by:   Randell Loop CMA   Authorized by:   Michele Mcalpine MD   Signed by:   Randell Loop CMA on 09/10/2010   Method used:   Faxed to ...       [facility]* (retail)             ,          Ph: [phone]       Fax: [phone]   RxID:   [id number] ZITHROMAX Z-PAK 250 MG TABS  (AZITHROMYCIN) take as directed...  #1 pack x 3   Entered and Authorized by:   Michele Mcalpine MD   Signed by:   Michele Mcalpine MD on 09/10/2010   Method used:   Print then Give to Patient   RxID:   [id number]

## 2010-12-04 NOTE — Assessment & Plan Note (Signed)
Summary: Acute NP office visit - asthma flare   Primary Provider/Referring Provider:  Kriste Basque  CC:  asthma flare with increased SOB, wheezing, prod cough that cleared since finishing zpak this past Sunday, sinus congestion with clear drainage, and ears/eyes itching x2weeks - denies f/c/s.  History of Present Illness: 70 y/o WF with known history  of hyperlipidemia, GERD, DJD, HTN and Asthma. Has chronic dyspnea. Baseline activity - walks on treadmill 3x / week .       December 27, 2008--Pt presents to office for an acute visit today.  Pt began having SOB, sore throat, wheezing, productive cough with yellow colored sputum and nasal congestion last Wednesday.  Dr. Kriste Basque called in  a Zithromax pak which pt took for one day.  Pt went to Urgent Care on Thursday with no improvement.  Urgent care prescribed Avelox, Prednisone taper, Astepro Nasal Spray, Mucinex two times a day and Tussionex two times a day.  Pt states she has had no fever since Saturday, denies any chills or aches.  No N/V/D.  Denies blood in sputum.  Pt went back to Urgent Care on Sunday afternoon where they administered Depo injection and CXR was done.  Pt is on her fifth day of Avelox today, last day of Pred taper and states that she feels worse today than last week.  Continuing to have wheezing dyspneic, and cough. Denies chest pain,  orthopnea, hemoptysis, , n/v/d, edema, headache, calf pain,. Have some sinus congestion/pressure/drainage.   June 23, 2009 O/V c/o wheezing and shortness of breath last pm.  Reports she saw Rikki Spearing, NP on 8/18 for acute asthma exacerbation/cough.  Rx for prednisone taper and tessalon pearls for cough.  Reports feeling better during the day 8/19 but symptoms flared again last pm requiring an albuterol neb at 2100 and 0300.  She now reports symptom improvement--decreased wheezing and cough.  See ROS.   November 10, 2009 --Presents for an acute office visit. Complains of asthma flare with increased  SOB, wheezing, prod cough that cleared since finishing zpak this past Sunday, sinus congestion with clear drainage, ears/eyes itching x2weeks. Wants to change brand name meds d/t cost. Denies chest pain,  orthopnea, hemoptysis, fever, n/v/d, edema, headache.   Medications Prior to Update: 1)  Allegra 180 Mg  Tabs (Fexofenadine Hcl) .... Take 1 Tab By Mouth Daily As Needed For Allergies.Marland KitchenMarland Kitchen 2)  Albuterol Sulfate (2.5 Mg/90ml) 0.083%  Nebu (Albuterol Sulfate) .... As Needed 3)  Symbicort 160-4.5 Mcg/act Aero (Budesonide-Formoterol Fumarate) .... Inhale 2 Sprays Two Times A Day.Marland Kitchen 4)  Qvar 80 Mcg/act Aers (Beclomethasone Dipropionate) .... Take As Directed... 5)  Proair Hfa 108 (90 Base) Mcg/act  Aers (Albuterol Sulfate) .... Inhale 2 Puffs Q 6h As Needed For Wheezing... 6)  Adult Aspirin Low Strength 81 Mg  Tbdp (Aspirin) .... Take 1 Tablet By Mouth Once A Day 7)  Norvasc 10 Mg  Tabs (Amlodipine Besylate) .... Take 1 Tablet By Mouth Once A Day 8)  Diovan 320 Mg  Tabs (Valsartan) .... Take 1 Tablet By Mouth Once A Day 9)  Minipress 5 Mg  Caps (Prazosin Hcl) .... Take One Tablet By Mouth Two Times A Day 10)  Lasix 40 Mg  Tabs (Furosemide) .... Take 1 Tablet By Mouth Once A Day 11)  Crestor 5 Mg  Tabs (Rosuvastatin Calcium) .... Take 1 Tablet By Mouth Once A Day 12)  Co Q-10 100 Mg Caps (Coenzyme Q10) .... Take 1 Cap Daily... 13)  Prevacid 30 Mg  Cpdr (  Lansoprazole) .... Take 1 Capsule By Mouth Twice Times A Day 14)  Elmiron 100 Mg  Caps (Pentosan Polysulfate Sodium) .... Take One Tablet By Mouth Three Times A Day 15)  Ddavp 0.2 Mg  Tabs (Desmopressin Acetate) .... Take 3 Tablets By Mouth At Bedtime 16)  Evista 60 Mg  Tabs (Raloxifene Hcl) .... Take 1 Tablet By Mouth Once A Day 17)  Ranitidine Hcl 300 Mg Caps (Ranitidine Hcl) .... As Needed 18)  Vitamin C 500 Mg  Tabs (Ascorbic Acid) .... Take One Tablet By Mouth Twice Daily. 19)  Vitamin D 5000 Unit  Tabs (Cholecalciferol) .... Take One Tablet By Mouth  Once Daily. 20)  Calcium 1500 Mg Tabs (Calcium Carbonate) .... Take One Tablet By Mouth Once Daily. 21)  Magnesium  Tabs (Magnesium) .... Take One Tablet By Mouth Once Daily. 22)  Multivitamins   Tabs (Multiple Vitamin) .... Take One Tablet By Mouth Once Daily. 23)  Allergy Shot .... Weekly 24)  Zithromax Z-Pak 250 Mg Tabs (Azithromycin) .... Take As Directed  Current Medications (verified): 1)  Allegra 180 Mg  Tabs (Fexofenadine Hcl) .... Take 1 Tab By Mouth Daily As Needed For Allergies.Marland KitchenMarland Kitchen 2)  Albuterol Sulfate (2.5 Mg/82ml) 0.083%  Nebu (Albuterol Sulfate) .... As Needed 3)  Symbicort 160-4.5 Mcg/act Aero (Budesonide-Formoterol Fumarate) .... Inhale 2 Sprays Two Times A Day.Marland Kitchen 4)  Qvar 80 Mcg/act Aers (Beclomethasone Dipropionate) .... Take As Directed... 5)  Proair Hfa 108 (90 Base) Mcg/act  Aers (Albuterol Sulfate) .... Inhale 2 Puffs Q 6h As Needed For Wheezing... 6)  Adult Aspirin Low Strength 81 Mg  Tbdp (Aspirin) .... Take 1 Tablet By Mouth Once A Day 7)  Norvasc 10 Mg  Tabs (Amlodipine Besylate) .... Take 1 Tablet By Mouth Once A Day 8)  Diovan 320 Mg  Tabs (Valsartan) .... Take 1 Tablet By Mouth Once A Day 9)  Minipress 5 Mg  Caps (Prazosin Hcl) .... Take One Tablet By Mouth Two Times A Day 10)  Lasix 40 Mg  Tabs (Furosemide) .... Take 1 Tablet By Mouth Once A Day 11)  Crestor 5 Mg  Tabs (Rosuvastatin Calcium) .... Take 1 Tablet By Mouth Once A Day 12)  Co Q-10 100 Mg Caps (Coenzyme Q10) .... Take 1 Cap Daily... 13)  Prevacid 30 Mg  Cpdr (Lansoprazole) .... Take 1 Capsule By Mouth Twice Times A Day 14)  Elmiron 100 Mg  Caps (Pentosan Polysulfate Sodium) .... Take One Tablet By Mouth Three Times A Day 15)  Ddavp 0.2 Mg  Tabs (Desmopressin Acetate) .... Take 3 Tablets By Mouth At Bedtime 16)  Evista 60 Mg  Tabs (Raloxifene Hcl) .... Take 1 Tablet By Mouth Once A Day 17)  Ranitidine Hcl 300 Mg Caps (Ranitidine Hcl) .... As Needed 18)  Vitamin C 500 Mg  Tabs (Ascorbic Acid) .... Take  One Tablet By Mouth Twice Daily. 19)  Vitamin D 5000 Unit  Tabs (Cholecalciferol) .... Take One Tablet By Mouth Once Daily. 20)  Calcium 1500 Mg Tabs (Calcium Carbonate) .... Take One Tablet By Mouth Once Daily. 21)  Magnesium  Tabs (Magnesium) .... Take One Tablet By Mouth Once Daily. 22)  Multivitamins   Tabs (Multiple Vitamin) .... Take One Tablet By Mouth Once Daily. 23)  Allergy Shot .... Weekly  Allergies (verified): 1)  ! Phenobarbital 2)  ! Keflex 3)  ! Penicillin  Past History:  Past Medical History: Last updated: 09/14/2009 ALLERGIC RHINITIS (ICD-477.9) & ASTHMA (ICD-493.90) - she takes ALLEGRA 180mg /d, SYMBICORT 80-  2spBid, PROAIR & ALBUTEROL 2.5mg  for nebulizer 2-3 times daily... DrKozlow has stopped the Pred, Qvar, Singulair, Ranitadine...  ~  CXR 6/09 was clear...   ~  CXR 2/10 hosp showed mild peribronch thickening, NAD...  ~  PFT 3/10 showed FVC=2.36 (90%), FEV1=1.78 (85%), %1sec=75, mid-flows=57%pred...  ~  4/10: she saw DrKozlow for second opinion & Rx of her AB...  BRONCHITIS, RECURRENT (ICD-491.9) - hx freq infectious exas...  ~  CTChest 2008 w/ 3mm nodule in RUL- unchanged over 3mo interval, small HH, noncalcif pleural plaques bilat... she is reassured w/ the recent CXR...  HYPERTENSION (ICD-401.9) - controlled on 4 meds:  NORVASC 10mg /d, DIOVAN 320mg /d, MINIPRESS 5mg Bid, & LASIX 40mg /d...    ~  2DEcho 8/09 showed norm LV wall motion & EF= 60-65%, mild incr AoV thickness, mild LA dil & atrial septal aneurysm noted. EF  60%   echo   ... 2009 Abnormal EKG...... no wall motion abnormality by echo... Mitral valve prolapse... question in the past.... no prolapse by ech0 Carotid artery disease... Doppler April, 2009 0-39% bilateral Shortness of breath  ? 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) - she had an abn lif[facility] screen 3/08 w/ left Carotid in the mild/mod range...  ~  CDopplers 4/09 showed mild smooth heterogen plaque in bulbs, 0-39% ICA stenoses...  VENOUS  INSUFFICIENCY (ICD-459.81) - she knows to avoid sodium, elevate legs, wear support hose, continue the Lasix...  HYPERCHOLESTEROLEMIA (ICD-272.0) - on CRESTOR 5mg /d + CoQ10 daily... prev intol to statins w/ severe leg cramps but the addition of the CoQ10 has made a world of difference to her tolerability...  ~  FLP 3/08 on diet alone showed TChol 268, TG 35, HDL 96, LDL 166...  ~  FLP 9/08 on Crestor 5mg /d showed TChol 212, TG 37, HDL 73, LDL 127... continue Rx.  ~  FLP 12/09 showed TChol 179, TG 38, HDL 78, LDL 93... 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 by [facility] in 2006 S/P cystoscopy & basket stone removal from right ureter 4/07 by DrPeterson  Family History: Last updated: 19-Aug-2009 Father died age 75 from pancreatic cancer Mother died age 40 w/ a heart attack 4 Siblings> 2 Brothers- one died age 18 w/ bile duct cancer 2 Sisters- one died age 58 w/ a heart attack 1Brother Atrial fibrillation  Social History: Last updated: 06/02/2009 Married 2 Children Never smoked Social alcohol Retired  Risk Factors: Smoking Status: never (01/05/2009)  Review of Systems      See HPI  Vital Signs:  Patient profile:   70 year old female Height:      62 inches Weight:      197.50 pounds BMI:     36.25 O2 Sat:      94 % on Room air Temp:     97.4 degrees F Pulse rate:   82 / minute BP sitting:   128 / 80  (right arm) Cuff size:   large  Vitals Entered By: Boone Master CNA (November 10, 2009 11:53 AM)  O2 Flow:  Room air CC: asthma flare with increased SOB, wheezing, prod cough that cleared since finishing zpak this past Sunday, sinus congestion with clear drainage, ears/eyes itching x2weeks - denies f/c/s Is Patient Diabetic? No Comments Medications reviewed with patient Daytime contact number verified with patient. Boone Master CNA  November 10, 2009 11:53 AM    Physical  Exam  Additional Exam:  WD, Overweight, 70  y/o WF in NAD GENERAL:  Alert & oriented; pleasant & cooperative HEENT:  [place]/AT, EOM-wnl, PERRLA, EACs-clear,  NOSE-clear, THROAT-no erythema or exudate NECK:  Supple w/ fairROM; no JVD; normal carotid impulses w/o bruits; no lymphadenopathy. CHEST:  Coarse BS w/ few wheezing.    HEART:  Regular Rhythm; without murmurs/ rubs/ or gallop ABDOMEN:  Obese, soft & nontender; normal bowel sounds EXT: without deformities or edema NEURO:  CN's grossly intact DERM:  No rashes or lesions noted.    Impression & Recommendations:  Problem # 1:  BRONCHITIS, RECURRENT (ICD-491.9)  Exacerbation REC:  Prednsione taper over next week.  Mucienx DM two times a day as needed cough /congsestion Hydromet 1-2 tsp every 4-6 hr as needed cough Please contact office for sooner follow up if symptoms do not improve or worsen  follow up Dr. Kriste Basque in 4 weeks   Orders: Est. Patient Level IV (16109)  Problem # 2:  HYPERTENSION (ICD-401.9)  Change Diovan to Cozaar 100mg  once daily per pt request.  Her updated medication list for this problem includes:    Norvasc 10 Mg Tabs (Amlodipine besylate) .Marland Kitchen... Take 1 tablet by mouth once a day    Losartan Potassium 100 Mg Tabs (Losartan potassium) .Marland Kitchen... 1 by mouth once daily    Minipress 5 Mg Caps (Prazosin hcl) .Marland Kitchen... Take one tablet by mouth two times a day    Lasix 40 Mg Tabs (Furosemide) .Marland Kitchen... Take 1 tablet by mouth once a day  BP today: 128/80 Prior BP: 136/82 (09/14/2009)  Labs Reviewed: K+: 3.7 (10/06/2008) Creat: : 0.7 (10/06/2008)   Chol: 179 (10/06/2008)   HDL: 78.2 (10/06/2008)   LDL: 93 (10/06/2008)   TG: 38 (10/06/2008)  Orders: Est. Patient Level IV (60454)  Problem # 3:  HYPERCHOLESTEROLEMIA (ICD-272.0)  Change Crestor to Simvastatin 20mg  at bedtime per pt request diet discussed.     Orders: Est. Patient Level IV (09811)  Medications Added to Medication List This Visit: 1)  Losartan Potassium 100  Mg Tabs (Losartan potassium) .Marland Kitchen.. 1 by mouth once daily 2)  Simvastatin 20 Mg Tabs (Simvastatin) .Marland Kitchen.. 1 by mouth at bedtime 3)  Prednisone 10 Mg Tabs (Prednisone) .... 4 tabs for 2 days, then 3 tabs for 2 days, 2 tabs for 2 days, then 1 tab for 2 days, then stop 4)  Hydromet 5-1.5 Mg/38ml Syrp (Hydrocodone-homatropine) .Marland Kitchen.. 1-2 tsp every 4-6 hr as needed cough  Complete Medication List: 1)  Allegra 180 Mg Tabs (Fexofenadine hcl) .... Take 1 tab by mouth daily as needed for allergies.Marland KitchenMarland Kitchen 2)  Albuterol Sulfate (2.5 Mg/18ml) 0.083% Nebu (Albuterol sulfate) .... As needed 3)  Symbicort 160-4.5 Mcg/act Aero (Budesonide-formoterol fumarate) .... Inhale 2 sprays two times a day.Marland Kitchen 4)  Qvar 80 Mcg/act Aers (Beclomethasone dipropionate) .... Take as directed... 5)  Proair Hfa 108 (90 Base) Mcg/act Aers (Albuterol sulfate) .... Inhale 2 puffs q 6h as needed for wheezing... 6)  Adult Aspirin Low Strength 81 Mg Tbdp (Aspirin) .... Take 1 tablet by mouth once a day 7)  Norvasc 10 Mg Tabs (Amlodipine besylate) .... Take 1 tablet by mouth once a day 8)  Losartan Potassium 100 Mg Tabs (Losartan potassium) .Marland Kitchen.. 1 by mouth once daily 9)  Minipress 5 Mg Caps (Prazosin hcl) .... Take one tablet by mouth two times a day 10)  Lasix 40 Mg Tabs (Furosemide) .... Take 1 tablet by mouth once a day 11)  Simvastatin 20 Mg Tabs (Simvastatin) .Marland Kitchen.. 1 by mouth at bedtime 12)  Co Q-10  100 Mg Caps (Coenzyme q10) .... Take 1 cap daily... 13)  Prevacid 30 Mg Cpdr (Lansoprazole) .... Take 1 capsule by mouth twice times a day 14)  Elmiron 100 Mg Caps (Pentosan polysulfate sodium) .... Take one tablet by mouth three times a day 15)  Ddavp 0.2 Mg Tabs (Desmopressin acetate) .... Take 3 tablets by mouth at bedtime 16)  Evista 60 Mg Tabs (Raloxifene hcl) .... Take 1 tablet by mouth once a day 17)  Ranitidine Hcl 300 Mg Caps (Ranitidine hcl) .... As needed 18)  Vitamin C 500 Mg Tabs (Ascorbic acid) .... Take one tablet by mouth twice  daily. 19)  Vitamin D 5000 Unit Tabs (cholecalciferol)  .... Take one tablet by mouth once daily. 20)  Calcium 1500 Mg Tabs (Calcium carbonate) .... Take one tablet by mouth once daily. 21)  Magnesium Tabs (magnesium)  .... Take one tablet by mouth once daily. 22)  Multivitamins Tabs (Multiple vitamin) .... Take one tablet by mouth once daily. 23)  Allergy Shot  .... Weekly 24)  Prednisone 10 Mg Tabs (Prednisone) .... 4 tabs for 2 days, then 3 tabs for 2 days, 2 tabs for 2 days, then 1 tab for 2 days, then stop 25)  Hydromet 5-1.5 Mg/70ml Syrp (Hydrocodone-homatropine) .Marland Kitchen.. 1-2 tsp every 4-6 hr as needed cough  Patient Instructions: 1)  Change Crestor to Simvastatin 20mg  at bedtime  2)  Change Diovan to Cozaar 100mg  once daily  3)  Prednsione taper over next week.  4)  Mucienx DM two times a day as needed cough /congsestion 5)  Hydromet 1-2 tsp every 4-6 hr as needed cough 6)  Please contact office for sooner follow up if symptoms do not improve or worsen  7)  follow up Dr. Kriste Basque in 4 weeks  Prescriptions: PROAIR HFA 108 (90 BASE) MCG/ACT  AERS (ALBUTEROL SULFATE) inhale 2 puffs Q 6H as needed for wheezing...  #3 x 4   Entered by:   Boone Master CNA   Authorized by:   Rubye Oaks NP   Signed by:   Boone Master CNA on 11/10/2009   Method used:   Faxed to ...       [facility]* (mail-order)             ,          Ph: [phone]       Fax: [phone]   RxID:   [id number] HYDROMET 5-1.5 MG/5ML SYRP (HYDROCODONE-HOMATROPINE) 1-2 tsp every 4-6 hr as needed cough  #8 oz x 0   Entered and Authorized by:   Rubye Oaks NP   Signed by:   Tammy Parrett NP on 11/10/2009   Method used:   Print then Give to Patient   RxID:   [id number] PREDNISONE 10 MG TABS (PREDNISONE) 4 tabs for 2 days, then 3 tabs for 2 days, 2 tabs for 2 days, then 1 tab for 2 days, then stop  #20 x 0   Entered and Authorized by:   Rubye Oaks NP   Signed by:   Tammy Parrett NP on 11/10/2009   Method  used:   Electronically to        [facility]  [id number] (retail)       [address]       [place], [place]  [address]       Ph: [phone] or [phone]       Fax: [phone]   RxID:  [id number] LOSARTAN POTASSIUM 100 MG TABS (LOSARTAN POTASSIUM) 1 by mouth once daily  #90 x 3   Entered and Authorized by:   Rubye Oaks NP   Signed by:   Tammy Parrett NP on 11/10/2009   Method used:   Faxed to ...       [facility]* (mail-order)             ,          Ph: [phone]       Fax: [phone]   RxID:   [id number] SIMVASTATIN 20 MG TABS (SIMVASTATIN) 1 by mouth at bedtime  #90 x 3   Entered and Authorized by:   Rubye Oaks NP   Signed by:   Tammy Parrett NP on 11/10/2009   Method used:   Faxed to ...       [facility]* (mail-order)             ,          Ph: [phone]       Fax: [phone]   RxID:   [id number]    Immunization History:  Influenza Immunization History:    Influenza:  historical (08/04/2009)

## 2010-12-04 NOTE — Assessment & Plan Note (Signed)
Summary: Acute NP office visit - head injury   Primary Provider/Referring Provider:  Kriste Basque  CC:  pt states she was shopping at a local store and a display item fell from a shelf onto her head 01-15-10, has been having HA's, and tingling sensation and occasionally staggers when walking.Brianna Mccarty  History of Present Illness: 70 y/o WF with known history  of hyperlipidemia, GERD, DJD, HTN and Asthma. Has chronic dyspnea. Baseline activity - walks on treadmill 3x / week .        June 23, 2009 O/V c/o wheezing and shortness of breath last pm.  Reports she saw Rikki Spearing, NP on 8/18 for acute asthma exacerbation/cough.  Rx for prednisone taper and tessalon pearls for cough.  Reports feeling better during the day 8/19 but symptoms flared again last pm requiring an albuterol neb at 2100 and 0300.  She now reports symptom improvement--decreased wheezing and cough.  See ROS.   November 10, 2009 --Presents for an acute office visit. Complains of asthma flare with increased SOB, wheezing, prod cough that cleared since finishing zpak this past Sunday, sinus congestion with clear drainage, ears/eyes itching x2weeks. Wants to change brand name meds d/t cost.    January 31, 2010--Presents for an acute office visit. Complains of headache.   pt states she was shopping at a local store and a display item fell from a shelf onto her head 01-15-10, has been having HA's, tingling sensation and occasionally staggers when walking. When she bent down to look at piece of furniture, piece of decoration fell from above her on the back of head. She filed a report at the store. Since then she has soreness along where it hit her. She did not lose consciousness, no visual speech changes. HA started within 15 min after leaving store. Has had headache since then that comes and goes. Feeels lightheaded when she moves very slightly lasts for few seconds and resolbve. Their was no knot, break in skin . Denies chest pain, dyspnea, orthopnea,  hemoptysis, fever, n/v/d, edema, headache,visual/speech changes.   Medications Prior to Update: 1)  Allergy Shot .... Weekly 2)  Allegra 180 Mg  Tabs (Fexofenadine Hcl) .... Take 1 Tab By Mouth Daily As Needed For Allergies.Brianna KitchenMarland Mccarty 3)  Albuterol Sulfate (2.5 Mg/52ml) 0.083%  Nebu (Albuterol Sulfate) .... As Needed 4)  Symbicort 160-4.5 Mcg/act Aero (Budesonide-Formoterol Fumarate) .... Inhale 2 Sprays Two Times A Day.Brianna Mccarty 5)  Proair Hfa 108 (90 Base) Mcg/act  Aers (Albuterol Sulfate) .... Inhale 2 Puffs Q 6h As Needed For Wheezing... 6)  Adult Aspirin Low Strength 81 Mg  Tbdp (Aspirin) .... Take 1 Tablet By Mouth Once A Day 7)  Norvasc 10 Mg  Tabs (Amlodipine Besylate) .... Take 1 Tablet By Mouth Once A Day 8)  Losartan Potassium 100 Mg Tabs (Losartan Potassium) .Brianna Mccarty.. 1 By Mouth Once Daily 9)  Minipress 5 Mg  Caps (Prazosin Hcl) .... Take One Tablet By Mouth Two Times A Day 10)  Lasix 40 Mg  Tabs (Furosemide) .... Take 1 Tablet By Mouth Once A Day 11)  Simvastatin 20 Mg Tabs (Simvastatin) .Brianna Mccarty.. 1 By Mouth At Bedtime 12)  Co Q-10 100 Mg Caps (Coenzyme Q10) .... Take 1 Cap Daily... 13)  Prevacid 30 Mg  Cpdr (Lansoprazole) .... Take 1 Capsule By Mouth Twice Times A Day 14)  Elmiron 100 Mg  Caps (Pentosan Polysulfate Sodium) .... Take One Tablet By Mouth Three Times A Day 15)  Ddavp 0.2 Mg  Tabs (Desmopressin Acetate) .... Take  3 Tablets By Mouth At Bedtime 16)  Evista 60 Mg  Tabs (Raloxifene Hcl) .... Take 1 Tablet By Mouth Once A Day 17)  Calcium 1500 Mg Tabs (Calcium Carbonate) .... Take One Tablet By Mouth Once Daily. 18)  Multivitamins   Tabs (Multiple Vitamin) .... Take One Tablet By Mouth Once Daily. 19)  Vitamin C 500 Mg  Tabs (Ascorbic Acid) .... Take One Tablet By Mouth Twice Daily. 20)  Cvs Vit D 5000 High-Potency 5000 Unit Caps (Cholecalciferol) .... Take 1 Cap By Mouth Once Daily... 21)  Mag-Oxide 400 Mg Tabs (Magnesium Oxide) .... Take 1 Tab By Mouth Once Daily...  Current Medications  (verified): 1)  Allergy Shot .... Weekly 2)  Allegra 180 Mg  Tabs (Fexofenadine Hcl) .... Take 1 Tab By Mouth Daily As Needed For Allergies.Brianna KitchenMarland Mccarty 3)  Albuterol Sulfate (2.5 Mg/34ml) 0.083%  Nebu (Albuterol Sulfate) .... As Needed 4)  Symbicort 160-4.5 Mcg/act Aero (Budesonide-Formoterol Fumarate) .... Inhale 2 Sprays Two Times A Day.Brianna Mccarty 5)  Proair Hfa 108 (90 Base) Mcg/act  Aers (Albuterol Sulfate) .... Inhale 2 Puffs Q 6h As Needed For Wheezing... 6)  Adult Aspirin Low Strength 81 Mg  Tbdp (Aspirin) .... Take 1 Tablet By Mouth Once A Day 7)  Norvasc 10 Mg  Tabs (Amlodipine Besylate) .... Take 1 Tablet By Mouth Once A Day 8)  Losartan Potassium 100 Mg Tabs (Losartan Potassium) .Brianna Mccarty.. 1 By Mouth Once Daily 9)  Minipress 5 Mg  Caps (Prazosin Hcl) .... Take One Tablet By Mouth Two Times A Day 10)  Lasix 40 Mg  Tabs (Furosemide) .... Take 1 Tablet By Mouth Once A Day 11)  Simvastatin 20 Mg Tabs (Simvastatin) .Brianna Mccarty.. 1 By Mouth At Bedtime 12)  Co Q-10 100 Mg Caps (Coenzyme Q10) .... Take 1 Cap Daily... 13)  Prevacid 30 Mg  Cpdr (Lansoprazole) .... Take 1 Capsule By Mouth Twice Times A Day 14)  Elmiron 100 Mg  Caps (Pentosan Polysulfate Sodium) .... Take One Tablet By Mouth Three Times A Day 15)  Ddavp 0.2 Mg  Tabs (Desmopressin Acetate) .... Take 3 Tablets By Mouth At Bedtime 16)  Evista 60 Mg  Tabs (Raloxifene Hcl) .... Take 1 Tablet By Mouth Once A Day 17)  Calcium 1500 Mg Tabs (Calcium Carbonate) .... Take One Tablet By Mouth Once Daily. 18)  Multivitamins   Tabs (Multiple Vitamin) .... Take One Tablet By Mouth Once Daily. 19)  Vitamin C 500 Mg  Tabs (Ascorbic Acid) .... Take One Tablet By Mouth Twice Daily. 20)  Cvs Vit D 5000 High-Potency 5000 Unit Caps (Cholecalciferol) .... Take 1 Cap By Mouth Once Daily... 21)  Mag-Oxide 400 Mg Tabs (Magnesium Oxide) .... Take 1 Tab By Mouth Once Daily...  Allergies (verified): 1)  ! Phenobarbital 2)  ! Keflex 3)  ! Penicillin  Past History:  Past Medical  History: Last updated: 12/12/2009  ALLERGIC RHINITIS (ICD-477.9) ASTHMA (ICD-493.90) BRONCHITIS, RECURRENT (ICD-491.9) HYPERTENSION (ICD-401.9) CHEST PAIN-UNSPECIFIED (ICD-786.50) ? 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) VENOUS INSUFFICIENCY (ICD-459.81) HYPERCHOLESTEROLEMIA (ICD-272.0) GERD (ICD-530.81) DIVERTICULOSIS OF COLON (ICD-562.10) INTERSTITIAL CYSTITIS (ICD-595.1) RENAL CALCULUS, HX OF (ICD-V13.01) DEGENERATIVE JOINT DISEASE (ICD-715.90) OSTEOPOROSIS (ICD-733.00) HEADACHE (ICD-784.0) Hx of MALIGNANT MELANOMA (ICD-172.9)  Past Surgical History: Last updated: 12/12/2009 S/P left THR 2/04 by DrGioffre S/P melanoma removed from medial left knee area by [facility] in 2006 S/P cystoscopy & basket stone removal from right ureter 4/07 by DrPeterson  Family History: Last updated: 08-08-2009 Father died age 78 from pancreatic cancer Mother died age 22 w/ a heart  attack 4 Siblings> 2 Brothers- one died age 25 w/ bile duct cancer 2 Sisters- one died age 41 w/ a heart attack 1Brother Atrial fibrillation  Social History: Last updated: 06/02/2009 Married 2 Children Never smoked Social alcohol Retired  Risk Factors: Smoking Status: never (01/05/2009)  Review of Systems      See HPI  Vital Signs:  Patient profile:   70 year old female Height:      62 inches Weight:      192.25 pounds BMI:     35.29 O2 Sat:      96 % on Room air Temp:     97.6 degrees F oral Pulse rate:   76 / minute BP sitting:   104 / 82  (left arm) Cuff size:   regular  Vitals Entered By: Boone Master CNA (January 31, 2010 11:32 AM)  O2 Flow:  Room air CC: pt states she was shopping at a local store and a display item fell from a shelf onto her head 01-15-10, has been having HA's, tingling sensation and occasionally staggers when walking. Is Patient Diabetic? No Comments Medications reviewed with patient Daytime contact number verified with patient. Boone Master CNA  January 31, 2010 11:32 AM      Physical Exam  Additional Exam:  WD, Overweight, 70 y/o WF in NAD GENERAL:  Alert & oriented; pleasant & cooperative HEENT:  [place]/AT, EOM-wnl, PERRLA, EACs-clear,  NOSE-clear, THROAT-no erythema or exudate NECK:  Supple w/ fairROM; no JVD; normal carotid impulses w/o bruits; no lymphadenopathy. CHEST:  CTA w/ no wheeizng .    HEART:  Regular Rhythm; without murmurs/ rubs/ or gallop ABDOMEN:  Obese, soft & nontender; normal bowel sounds EXT: without deformities or edema NEURO:  CN's grossly intact, no focal deficits noted. MAEWx4, nml grips, nml gait.  DERM:  No rashes or lesions noted.    Impression & Recommendations:  Problem # 1:  HEADACHE (ICD-784.0)  s/p contusion on 01/15/10, exam is unrevealing. Would use tylenol as needed pain.  increase fluids. if symptoms persist will need further evaluation.  Please contact office for sooner follow up if symptoms do not improve or worsen  Her updated medication list for this problem includes:    Adult Aspirin Low Strength 81 Mg Tbdp (Aspirin) .Brianna Mccarty... Take 1 tablet by mouth once a day  Orders: Est. Patient Level III (78295)  Complete Medication List: 1)  Allergy Shot  .... Weekly 2)  Allegra 180 Mg Tabs (Fexofenadine hcl) .... Take 1 tab by mouth daily as needed for allergies.Brianna KitchenMarland Mccarty 3)  Albuterol Sulfate (2.5 Mg/11ml) 0.083% Nebu (Albuterol sulfate) .... As needed 4)  Symbicort 160-4.5 Mcg/act Aero (Budesonide-formoterol fumarate) .... Inhale 2 sprays two times a day.Brianna Mccarty 5)  Proair Hfa 108 (90 Base) Mcg/act Aers (Albuterol sulfate) .... Inhale 2 puffs q 6h as needed for wheezing... 6)  Adult Aspirin Low Strength 81 Mg Tbdp (Aspirin) .... Take 1 tablet by mouth once a day 7)  Norvasc 10 Mg Tabs (Amlodipine besylate) .... Take 1 tablet by mouth once a day 8)  Losartan Potassium 100 Mg Tabs (Losartan potassium) .Brianna Mccarty.. 1 by mouth once daily 9)  Minipress 5 Mg Caps (Prazosin hcl) .... Take one tablet by mouth two times a day 10)  Lasix 40 Mg Tabs  (Furosemide) .... Take 1 tablet by mouth once a day 11)  Simvastatin 20 Mg Tabs (Simvastatin) .Brianna Mccarty.. 1 by mouth at bedtime 12)  Co Q-10 100 Mg Caps (Coenzyme q10) .... Take 1 cap daily... 13)  Prevacid 30 Mg Cpdr (Lansoprazole) .... Take 1 capsule by mouth twice times a day 14)  Elmiron 100 Mg Caps (Pentosan polysulfate sodium) .... Take one tablet by mouth three times a day 15)  Ddavp 0.2 Mg Tabs (Desmopressin acetate) .... Take 3 tablets by mouth at bedtime 16)  Evista 60 Mg Tabs (Raloxifene hcl) .... Take 1 tablet by mouth once a day 17)  Calcium 1500 Mg Tabs (Calcium carbonate) .... Take one tablet by mouth once daily. 18)  Multivitamins Tabs (Multiple vitamin) .... Take one tablet by mouth once daily. 19)  Vitamin C 500 Mg Tabs (Ascorbic acid) .... Take one tablet by mouth twice daily. 20)  Cvs Vit D 5000 High-potency 5000 Unit Caps (Cholecalciferol) .... Take 1 cap by mouth once daily... 21)  Mag-oxide 400 Mg Tabs (Magnesium oxide) .... Take 1 tab by mouth once daily...  Patient Instructions: 1)  Increase fluids.  2)  May take tylenol or motrin as needed headache.  3)  Saline nasal rinses as needed nasal congestion . 4)  Please contact office for sooner follow up if symptoms do not improve or worsen

## 2010-12-04 NOTE — Progress Notes (Signed)
Summary: needs f/u w/ Sareena Odeh  Phone Note Call from Patient   Caller: Patient Call For: Gwendolin Briel Summary of Call: pt just left our office (saw tp). needs f/u in 4 wks w/ sn. call pt at [phone] (pt would like a pm appt).  Initial call taken by: Tivis Ringer,  November 10, 2009 12:25 PM  Follow-up for Phone Call        we can add pt on for 2-8 at 3pm.  thanks Randell Loop CMA  November 10, 2009 3:03 PM  pt scheduled. Carron Curie CMA  November 10, 2009 3:29 PM

## 2010-12-04 NOTE — Assessment & Plan Note (Signed)
Summary: rov 6 months///kp   Primary Care Provider:  Kriste Basque  CC:  6 month ROV & review of mult medical issues....  History of Present Illness: 70 y/o WF here for a follow up visit... she has mult med problems as noted below...    ~  [facility] 2/23-27/10 w/ refractory AB requiring IV Solumedrol/ Avelox, inhaled nebs, etc... slow to respond, on max meds + allergy & anti-reflux regimen... DC'd on Pred20Bid, NebsQid, Symbicort160, Mucinex, Singulair, etc (see below)...   ~  Jan 05, 2009:  she reports improved but slow to respond... she's hoarse and has aphthous ulcer in mouth and post pharynx (viral lesion)... also notes weak--- we discussed Rx w/ Acyclovir/ MMW, weaning Pred to 20mg /d, & short term follow up til resolved...  ~  Jan 24, 2009:  she's sl better but still not feeling well and c/o wheezing all the time w/ mild cough, no sputum, no f/c/s, +DOE, weak, etc... chest sounds clear x for forced end-exp wheezing, and PFT is basically normal x small airways dis... she wonders about second opinion--- we discussed weaning Pred to 10mg /d and consult from DrKozlow...  ~  Apr10:  she saw DrKozlow & he felt reflux related w/ additional Rx- added QVAR80 to the Symbicort, added Nasonex, added Ranitadine, & weaned the Pred to 5mg  Qod... she states good days & bad- she will f/u w/ DrKozlow...  ~  June 02, 2009:  Dr Lucie Leather has stopped the Pred, Qvar, Singulair, & Ranitadine; but she is starting to have problems again... saw DrKatz in June- BP good, weight down several lbs... she requests refills and ZPak for Prn use.   ~  December 12, 2009:  she's had a busy 16mo- several minor exac treated by NPs & improved... had f/u colonoscopy 9/10 by DrStark- +divertics, otherw neg, he plans f/u 1yrs...  had right sided atyp CP & went to [facility]- then f/u [facility] 9/10 w/ norm EKG, & 2DEcho showed norm LV wall motion & thickness, EF= 60-65%, gr I DD noted, sl dil LA, right side OK... no change in meds, consider stress test, f/u 36yr...   she requests new nebulizer machine...   ~  June 11, 2010:  she continues w/ freq exac- "required Pred 4 times since 1/11"...using NEB w/ Brovana & Budes Bid, + Symbicort Bid as before (see below)... 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 PFT's today are essent WNL & she is reassured... incr anxiety due to husb's TKR & she's casring for him at home...    Current Problems:   ALLERGIC RHINITIS (ICD-477.9) & ASTHMA (ICD-493.90) - she takes ALLEGRA 180mg /d, SYMBICORT 80- 2spBid, PROAIR & NEBS w/ BROVANA 15Bid & BUDESONIDE 0.25Bid... despite this she has freq exac requiring antibiotics and Pred... she still gets weekly shots from DrESL allergy clinic...  ~  CXR 6/09 was clear...   ~  CXR 2/10 hosp showed mild peribronch thickening, NAD...  ~  PFT 3/10 showed FVC=2.36 (90%), FEV1=1.78 (85%), %1sec=75, mid-flows=57%pred...  ~  4/10: she saw DrKozlow for second opinion & Rx of her AB & GERD...  ~  CXR 2/11 showed clear lungs, NAD... f/u 7/11= same (clear, NAD).Marland Kitchen.  ~  RAST testing 5/11 showed IgE= 130, & neg rast tests x cockroach +low titer.  ~  Sinus CT 8/11 showed no signif inflamm dis, mild membrane thickening, min ethmoid changes on right.  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 she is reassured w/ the recent CXR & serial films not showing nodule.  ~  no recent resp infections...  HYPERTENSION (ICD-401.9) & CHEST PAIN-UNSPECIFIED (ICD-786.50) - controlled on 4 meds:  NORVASC 10mg /d, LOSARTAN 100mg /d, MINIPRESS 5mg Bid, & LASIX 40mg /d... BP today = 128/78, and BP's at home are even better- 120's/ 60's per pt... denies HA, visual changes, CP, palipit, dizziness, syncope, edema, etc...  ~  2DEcho 8/09 showed norm LV wall motion & EF= 60-65%, mild incr AoV thickness, mild LA dil &  atrial septal aneurysm noted.  ~  EKG 9/10 by DrKatz- NSR, WNL...  ~  2DEcho 9/10 showed norm LV wall motion & thickness, EF= 60-65%, gr I DD noted, sl dil LA, right side OK...   ? 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 she had an abn lif[facility] screen 3/08 w/ left Carotid in the mild/mod range...  ~  CDopplers 4/09 showed mild smooth heterogen plaque in bulbs, 0-39% ICA stenoses...  VENOUS INSUFFICIENCY (ICD-459.81) - she knows to avoid sodium, elevate legs, wear support hose, continue the Lasix...  HYPERCHOLESTEROLEMIA (ICD-272.0) - on SIMVASTATIN 20mg /d + CoQ10 daily... prev intol to statins w/ severe leg cramps but the addition of the CoQ10 has made a world of difference to her tolerability...  ~  FLP 3/08 on diet alone showed TChol 268, TG 35, HDL 96, LDL 166...  ~  FLP 9/08 on Crestor 5mg /d showed TChol 212, TG 37, HDL 73, LDL 127... continue Rx.  ~  FLP 12/09 showed TChol 179, TG 38, HDL 78, LDL 93... continue same Rx.  ~  FLP 2/11 on Cres5 showed TChol 161, TG 42, HDL 70, LDL 83... pt switched to Simva20 per request.  GERD (ICD-530.81) - on PREVACID 30mg Bid...   ~  last EGD 6/08 by DrSam showed 4cmHH, gastitis, and stricture- dilated... prev HPylori neg in 2007.  DIVERTICULOSIS OF COLON (ICD-562.10)  ~  colonoscopy 3/05 by DrSam showed divertics only... f/u planned 53yrs (+fam hx in uncle).  ~  colonoscopy 9/10 by DrStark showed divertics only... he plans f/u 1yrs.  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 she has had DMSO in bladder + taking- ELMIRON 100mg Tid, & DDAVP 0.2mg -3tabsHS (off prev Vesicare)... last DMSO treatment  ~5/11 per pt.  RENAL CALCULUS, HX OF (ICD-V13.01) - required cysto & basket stone removal from right ureter in 2007...  DEGENERATIVE JOINT DISEASE (ICD-715.90) - she is s/p left THR by DrGioffre in 2004...  OSTEOPOROSIS (ICD-733.00) - on EVISTA 60mg /d... + calcium & MVI...  ~  labs 12/09 showed Vit D level = 34...  advised to start Vit D OTC 1-2,000 u daily...  HEADACHE (ICD-784.0) - prev eval at the [facility]- 1994 by DrSpillman, and 2003 by DrFreeman...  Hx of MALIGNANT MELANOMA (ICD-172.9) - removed by [facility] in 2006 from medial left knee area...  Health Maintenance - given f/u PNEUMOVAX at age 23 in 2009...    Preventive Screening-Counseling & Management  Alcohol-Tobacco     Smoking Status: never  Allergies: 1)  ! Phenobarbital 2)  ! Keflex 3)  ! Penicillin  Comments:  Nurse/Medical Assistant: The patient's medications and allergies were reviewed with the patient and were updated in the Medication and Allergy Lists.  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 Father died age 45 from pancreatic cancer Mother died age 79 w/ a heart attack 4 Siblings> 2 Brothers- one died age 21 w/ bile duct cancer 2 Sisters- one died age 53 w/ a heart attack 1Brother Atrial fibrillation  Social History: Reviewed history from 06/02/2009 and no changes required. Married 2 Children Never smoked Social alcohol Retired  Review of Systems      See HPI       The patient complains of dyspnea on exertion.  The patient denies  anorexia, fever, weight loss, weight gain, vision loss, decreased hearing, hoarseness, chest pain, syncope, peripheral edema, prolonged cough, headaches, hemoptysis, abdominal pain, melena, hematochezia, severe indigestion/heartburn, hematuria, incontinence, muscle weakness, suspicious skin lesions, transient blindness, difficulty walking, depression, unusual weight change, abnormal bleeding, enlarged lymph nodes, and angioedema.    Vital Signs:  Patient profile:   69 year old female Height:      62 inches Weight:      193.13 pounds BMI:     35.45 O2 Sat:      93 % on Room air Temp:     97.1 degrees F oral Pulse rate:   75 / minute BP sitting:   128 / 78  (left arm) Cuff size:   regular  Vitals Entered By: Randell Loop CMA (June 11, 2010 2:35 PM)  O2 Sat at Rest %:  93 O2 Flow:  Room air CC: 6 month ROV & review of mult medical issues... Is Patient Diabetic? No Pain Assessment Patient in pain? no      Comments no changes in meds today   Physical Exam  Additional Exam:  WD, Overweight, 70 y/o WF in NAD... GENERAL:  Alert & oriented; pleasant & cooperative... HEENT:  [place]/AT, EOM-wnl, PERRLA, EACs-clear, TMs- some wax, NOSE-clear, THROAT- resolved, clear now. NECK:  Supple w/ fairROM; no JVD; normal carotid impulses w/o bruits; no thyromegaly or nodules palpated; no lymphadenopathy. CHEST:  essentially clear now- without wheezing, rales, or signs of consolidation... mild forced end-exp wheeze. HEART:  Regular Rhythm; without murmurs/ rubs/ or gallops heard... ABDOMEN:  Obese, soft & nontender; normal bowel sounds; no organomegaly or masses palpated... EXT: without deformities, mild arthritic changes; no varicose veins/ +venous insuffic/ tr edema. NEURO:  CN's intact;  no focal neuro deficits... DERM:  No lesions noted; no rash etc...    MISC. Report  Procedure date:  06/11/2010  Findings:      DATA REVIEWED:   ~  Several EMR notes from nurse practitioner...  ~  Labs  from 5/11- RAST testing results reviewed w/ pt...  ~  CXR 7/11...  ~  CT Sinus 8/11...  ~  PFT's done today....   Pulmonary Function Test Date: 06/11/2010 3:20 PM Height (in.): 63 Gender: Female  Pre-Spirometry FVC    Value: 2.04 L/min   Pred: 2.87 L/min     % Pred: 71 % FEV1    Value: 1.58 L     Pred: 2.17 L     % Pred: 73 % FEV1/FVC  Value: 78 %     Pred: 76 %     %  Pred: 102 %  Comments: Compatible w/ mild restriction, good airflow... SN  Impression & Recommendations:  Problem # 1:  ASTHMA (ICD-493.90) She has AR treated by DrESL w/ shots weekly... Asthma is improved but she has sym[ptoms oput of proportion... continue current regimen & avoid systemic steroids as much as poss... The following medications were removed from the medication list:    Albuterol Sulfate (2.5 Mg/73ml) 0.083% Nebu (Albuterol sulfate) .Marland Kitchen... As needed Her updated medication list for this problem includes:    Brovana 15 Mcg/34ml Nebu (Arformoterol tartrate) .Marland Kitchen... 1 vial via hhn two times a day    Budesonide 0.25 Mg/22ml Susp (Budesonide) .Marland Kitchen... 1 vial via hhn two times a day    Symbicort 80-4.5 Mcg/act Aero (Budesonide-formoterol fumarate) .Marland Kitchen... 2 puffs two times a day    Proair Hfa 108 (90 Base) Mcg/act Aers (Albuterol sulfate) ..... Inhale 2 puffs q 6h as needed for wheezing...  Orders: Spirometry w/Graph (94010)  Problem # 2:  HYPERTENSION (ICD-401.9) Controlled>  same meds. Her updated medication list for this problem includes:    Norvasc 10 Mg Tabs (Amlodipine besylate) .Marland Kitchen... Take 1 tablet by mouth once a day    Losartan Potassium 100 Mg Tabs (Losartan potassium) .Marland Kitchen... 1 by mouth once daily    Minipress 5 Mg Caps (Prazosin hcl) .Marland Kitchen... Take one tablet by mouth two times a day    Lasix 40 Mg Tabs (Furosemide) .Marland Kitchen... Take 1 tablet by mouth once a day  Problem # 3:  CHEST PAIN-UNSPECIFIED (ICD-786.50) Followed yearly by drKatz for Cards...  Problem # 4:  HYPERCHOLESTEROLEMIA (ICD-272.0) Needs FLP on  the Simva20...  Her updated medication list for this problem includes:    Simvastatin 20 Mg Tabs (Simvastatin) .Marland Kitchen... 1 by mouth at bedtime  Problem # 5:  GERD (ICD-530.81) GI controlled and reflux may play a roll in the airway dis... continue rx. Her updated medication list for this problem includes:    Prevacid 30 Mg Cpdr (Lansoprazole) .Marland Kitchen... Take 1 capsule by mouth twice times a day    Pepcid Ac Maximum Strength 20 Mg Tabs (Famotidine) .Marland Kitchen... Take 1 tablet by mouth once a day as needed    Mag-oxide 400 Mg Tabs (Magnesium oxide) .Marland Kitchen... Take 1 tab by mouth once daily...  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 we don't have any recent notes from him...  Problem # 7:  DEGENERATIVE JOINT DISEASE (ICD-715.90) Followed by DrGioffre... doing better she says... Her updated medication list for this problem includes:    Adult Aspirin Low Strength 81 Mg Tbdp (Aspirin) .Marland Kitchen... Take 1 tablet by mouth once a day  Problem # 8:  OTHER MEDICAL PROBLEMS AS NOTED>>>  Complete Medication List: 1)  Allergy Shot  .... Weekly 2)  Allegra 180 Mg Tabs (Fexofenadine hcl) .... Take 1 tab by mouth daily as needed for allergies.Marland KitchenMarland Kitchen 3)  Astepro 0.15 % Soln (Azelastine hcl) .... 2 puffs two times a day 4)  Brovana 15 Mcg/30ml Nebu (Arformoterol tartrate) .Marland Kitchen.. 1 vial via hhn two times a day 5)  Budesonide 0.25 Mg/65ml Susp (Budesonide) .Marland Kitchen.. 1 vial via hhn two times a day 6)  Symbicort 80-4.5 Mcg/act Aero (Budesonide-formoterol fumarate) .... 2 puffs two times a day 7)  Proair Hfa 108 (90 Base) Mcg/act Aers (Albuterol sulfate) .... Inhale 2 puffs q 6h as needed for wheezing... 8)  Adult Aspirin Low Strength 81 Mg Tbdp (Aspirin) .... Take 1 tablet by mouth once a day 9)  Norvasc 10 Mg Tabs (Amlodipine besylate) .... Take 1 tablet by mouth once a day 10)  Losartan Potassium 100 Mg Tabs (Losartan potassium) .Marland Kitchen.. 1 by mouth once daily 11)  Minipress 5 Mg Caps  (Prazosin hcl) .... Take one tablet by mouth two times a day 12)  Lasix 40 Mg Tabs (Furosemide) .... Take 1 tablet by mouth once a day 13)  Simvastatin 20 Mg Tabs (Simvastatin) .Marland Kitchen.. 1 by mouth at bedtime 14)  Co Q-10 100 Mg Caps (Coenzyme q10) .... Take 1 cap daily... 15)  Prevacid 30 Mg Cpdr (Lansoprazole) .... Take 1 capsule by mouth twice times a day 16)  Pepcid Ac Maximum Strength 20 Mg Tabs (Famotidine) .... Take 1 tablet by mouth once a day as needed 17)  Elmiron 100 Mg Caps (Pentosan polysulfate sodium) .... Take one tablet by mouth three times a day 18)  Ddavp 0.2 Mg Tabs (Desmopressin acetate) .... Take 3 tablets by mouth at bedtime 19)  Evista 60 Mg Tabs (Raloxifene hcl) .... Take 1 tablet by mouth once a day 20)  Calcium 1500 Mg Tabs (Calcium carbonate) .... Take one tablet by mouth once daily. 21)  Mag-oxide 400 Mg Tabs (Magnesium oxide) .... Take 1 tab by mouth once daily... 22)  Multivitamins Tabs (Multiple vitamin) .... Take one tablet by mouth once daily. 23)  Vitamin C 500 Mg Tabs (Ascorbic acid) .... Take one tablet by mouth twice daily. 24)  Cvs Vit D 5000 High-potency 5000 Unit Caps (Cholecalciferol) .... Take 1 cap by mouth once daily...  CHF Assessment/Plan:      The patient's current weight is 193.13 pounds.  Her previous weight was 192.19 pounds.    Patient Instructions: 1)  Today we updated your med list- see below.... 2)  We decided to maximize your Asthma regimen & try to keep you off Prednisone by adding back the Symbicort inhaler- 2 sprays two times a day...  3)  Please return to our lab one morning this week for your FASTING blood work... then please call the "phone tree" in a few days for your lab results.Marland KitchenMarland Kitchen 4)  Continue to take your meds regularly to PREVENT exacerbations.Marland KitchenMarland Kitchen 5)  Please schedule a follow-up appointment in 3-4 months. 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

## 2010-12-04 NOTE — Assessment & Plan Note (Signed)
Summary: rov//jrc   Primary Care Provider:  Kriste Basque  CC:  6 month ROV & review of mult medical problems....  History of Present Illness: 70 y/o WF here for a follow up visit... she has mult med problems as noted below...    ~  [facility] 2/23-27/10 w/ refractory AB requiring IV Solumedrol/ Avelox, inhaled nebs, etc... slow to respond, on max meds + allergy & anti-reflux regimen... DC'd on Pred20Bid, NebsQid, Symbicort160, Mucinex, Singulair, etc (see below)...   ~  Jan 05, 2009:  she reports improved but slow to respond... she's hoarse and has aphthous ulcer in mouth and post pharynx (viral lesion)... also notes weak--- we discussed Rx w/ Acyclovir/ MMW, weaning Pred to 20mg /d, & short term follow up til resolved...  ~  Jan 24, 2009:  she's sl better but still not feeling well and c/o wheezing all the time w/ mild cough, no sputum, no f/c/s, +DOE, weak, etc... chest sounds clear x for forced end-exp wheezing, and PFT is basically normal x small airways dis... she wonders about second opinion--- we discussed weaning Pred to 10mg /d and consult from DrKozlow...  ~  Apr10:  she saw DrKozlow & he felt reflux related w/ additional Rx- added QVAR80 to the Symbicort, added Nasonex, added Ranitadine, & weaned the Pred to 5mg  Qod... she states good days & bad- she will f/u w/ DrKozlow...   ~  June 02, 2009:  Dr Lucie Leather has stopped the Pred, Qvar, Singulair, & Ranitadine; but she is starting to have problems again... saw DrKatz in June- BP good, weight down several lbs... she requests refills and ZPak for Prn use.   ~  December 12, 2009:  she's had a busy 73mo- several minor exac treated by NPs & improved... had f/u colonoscopy 9/10 by DrStark- +divertics, otherw neg, he plans f/u 43yrs...  had right sided atyp CP & went to [facility]- then f/u [facility] 9/10 w/ norm EKG, & 2DEcho showed norm LV wall motion & thickness, EF= 60-65%, gr I DD noted, sl dil LA, right side OK... no change in meds, consider stress test, f/u 95yr...  she  requests new nebulizer machine...    Current Problems:   ALLERGIC RHINITIS (ICD-477.9) & ASTHMA (ICD-493.90) - she takes ALLEGRA 180mg /d, SYMBICORT 80- 2spBid, PROAIR & ALBUTEROL 2.5mg  for nebulizer 2-3 times daily... despite this she has freq exac requiring antibiotics and Pred...  ~  CXR 6/09 was clear...   ~  CXR 2/10 hosp showed mild peribronch thickening, NAD...  ~  PFT 3/10 showed FVC=2.36 (90%), FEV1=1.78 (85%), %1sec=75, mid-flows=57%pred...  ~  4/10: she saw DrKozlow for second opinion & Rx of her AB & GERD...  ~  CXR 2/11 showed clear lungs, NAD...  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  ~  CTChest 2008 w/ 3mm nodule in RUL- unchanged over 73mo interval, small HH, noncalcif pleural plaques bilat... she is reassured w/ the recent CXR & serial films not showing nodule.  HYPERTENSION (ICD-401.9) & CHEST PAIN-UNSPECIFIED (ICD-786.50) - controlled on 4 meds:  NORVASC 10mg /d, DIOVAN 320mg /d, MINIPRESS 5mg Bid, & LASIX 40mg /d... BP today = 128/74, and BP's at home are even better- 120's/ 60's per pt... denies HA, visual changes, CP, palipit, dizziness, syncope, edema, etc...  ~  2DEcho 8/09 showed norm LV wall motion & EF= 60-65%, mild incr AoV thickness, mild LA dil & atrial septal aneurysm noted.  ~  EKG 9/10 by DrKatz- NSR, WNL...  ~  2DEcho 9/10 showed norm LV wall motion & thickness,  EF= 60-65%, gr I DD noted, sl dil LA, right side OK...   ? 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 she had an abn lifeline screen 3/08 w/ left Carotid in the mild/mod range...  ~  CDopplers 4/09 showed mild smooth heterogen plaque in bulbs, 0-39% ICA stenoses...  VENOUS INSUFFICIENCY (ICD-459.81) - she knows to avoid sodium, elevate legs, wear support hose, continue the Lasix...  HYPERCHOLESTEROLEMIA (ICD-272.0) - on CRESTOR 5mg /d + CoQ10 daily... prev intol to statins w/ severe leg cramps but the addition of the CoQ10 has made a world of difference to her  tolerability... asking to change to SIMVASTATIN 20mg /d for $$.  ~  FLP 3/08 on diet alone showed TChol 268, TG 35, HDL 96, LDL 166...  ~  FLP 9/08 on Crestor 5mg /d showed TChol 212, TG 37, HDL 73, LDL 127... continue Rx.  ~  FLP 12/09 showed TChol 179, TG 38, HDL 78, LDL 93... continue same Rx.  ~  FLP 2/11 on Cres5 showed =           > pt switched to Simva20 per request.  GERD (ICD-530.81) - on PREVACID 30mg Bid...   ~  last EGD 6/08 by DrSam showed 4cmHH, gastitis, and stricture- dilated... prev HPylori neg in 2007.  DIVERTICULOSIS OF COLON (ICD-562.10)  ~  colonoscopy 3/05 by DrSam showed divertics only... f/u planned 9yrs (+fam hx in uncle).  ~  colonoscopy 9/10 by DrStark showed divertics only... he plans f/u 37yrs.  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 she has had DMSO in bladder + taking- ELMIRON 100mg Tid, VESICARE 5mg /d, & DDAVP 0.2mg -3tabsHS...  RENAL CALCULUS, HX OF (ICD-V13.01) - required cysto & basket stone removal from right ureter in 2007...  DEGENERATIVE JOINT DISEASE (ICD-715.90) - she is s/p left THR by DrGioffre in 2004...  OSTEOPOROSIS (ICD-733.00) - on EVISTA 60mg /d... + calcium & MVI...  ~  labs 12/09 showed Vit D level = 34... advised to start Vit D OTC 1-2,000 u daily...  HEADACHE (ICD-784.0) - prev eval at the [facility]- 1994 by DrSpillman, and 2003 by DrFreeman...  Hx of MALIGNANT MELANOMA (ICD-172.9) - removed by [facility] in 2006 from medial left knee area...  Health Maintenance - given f/u PNEUMOVAX at age 30 in 2009...    Allergies: 1)  ! Phenobarbital 2)  ! Keflex 3)  ! Penicillin  Comments:  Nurse/Medical Assistant: The patient's medications and allergies were reviewed with the patient and were updated in the Medication and Allergy Lists.  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 Father died age 75 from pancreatic cancer Mother died age 8 w/ a heart attack 4 Siblings> 2 Brothers- one died age 56 w/ bile duct cancer 2 Sisters- one died age 8 w/ a heart attack 1Brother Atrial fibrillation  Social History: Reviewed history from 06/02/2009 and no changes required. Married 2 Children Never smoked Social alcohol Retired  Review of Systems  See HPI       The patient complains of dyspnea on exertion.  The patient denies anorexia, fever, weight loss, weight gain, vision loss, decreased hearing, hoarseness, chest pain, syncope, peripheral edema, prolonged cough, headaches, hemoptysis, abdominal pain, melena, hematochezia, severe indigestion/heartburn, hematuria, incontinence, muscle weakness, suspicious skin lesions, transient blindness, difficulty walking, depression, unusual weight change, abnormal bleeding, enlarged lymph nodes, and angioedema.    Vital Signs:  Patient profile:   70 year old female Height:      62 inches Weight:      195.13 pounds BMI:     35.82 O2 Sat:      95 % on Room air Temp:     97.1 degrees F oral Pulse rate:   81 / minute BP sitting:   128 / 74  (right arm) Cuff size:   regular  Vitals Entered By: Randell Loop CMA (December 12, 2009 3:00 PM)  O2 Sat at Rest %:  95 O2 Flow:  Room  air CC: 6 month ROV & review of mult medical problems... Is Patient Diabetic? No Pain Assessment Patient in pain? no      Comments meds updated today   Physical Exam  Additional Exam:  WD, Overweight, 70 y/o WF in NAD... GENERAL:  Alert & oriented; pleasant & cooperative... HEENT:  [place]/AT, EOM-wnl, PERRLA, EACs-clear, TMs- some wax, NOSE-clear, THROAT- resolved, clear now. NECK:  Supple w/ fairROM; no JVD; normal carotid impulses w/o bruits; no thyromegaly or nodules palpated; no lymphadenopathy. CHEST:  essentially clear now- without wheezing, rales, or signs of consolidation... mild forced end-exp wheeze. HEART:  Regular Rhythm; without murmurs/ rubs/ or gallops heard... ABDOMEN:  Obese, soft & nontender; normal bowel sounds; no organomegaly or masses palpated... EXT: without deformities, mild arthritic changes; no varicose veins/ +venous insuffic/ tr edema. NEURO:  CN's intact;  no focal neuro deficits... DERM:  No lesions noted; no rash etc...     CXR  Procedure date:  12/12/2009  Findings:      CHEST - 2 VIEW Comparison: 12/27/2008   Findings: The cardiac silhouette, mediastinal and hilar contours are stable.  The lungs are clear.  The bony thorax is intact.   IMPRESSION: No acute cardiopulmonary findings.  No change since prior chest x- ray 12/27/2008.   Read By:  Cyndie Chime,  M.D.       MISC. Report  Procedure date:  12/12/2009  Findings:      Pt to return to our lab next week for FASTING blood work... We will review the data and notify her of the results...  SN   Impression & Recommendations:  Problem # 1:  ASTHMA (ICD-493.90) She will continue the current meds regularly... we discussed antireflux regimen as well... OK new portable nebulizer machine... Her updated medication list for this problem includes:    Albuterol Sulfate (2.5 Mg/63ml) 0.083% Nebu (Albuterol sulfate) .Marland Kitchen... As needed    Symbicort 160-4.5 Mcg/act Aero (Budesonide-formoterol  fumarate) ..... Inhale 2 sprays two times a day.Rennis Golden Hfa 108 (90 Base) Mcg/act Aers (Albuterol sulfate) ..... Inhale 2 puffs q 6h as needed for wheezing...  Orders: T-2 View CXR (71020TC) >> clear & WNL.Marland KitchenMarland Kitchen  Problem # 2:  HYPERTENSION (ICD-401.9) Controlled-  same meds. Her updated medication list for this problem includes:    Norvasc 10 Mg Tabs (Amlodipine besylate) .Marland Kitchen... Take 1 tablet by mouth once a day    Losartan Potassium 100 Mg Tabs (Losartan potassium) .Marland Kitchen... 1 by mouth once daily  Minipress 5 Mg Caps (Prazosin hcl) .Marland Kitchen... Take one tablet by mouth two times a day    Lasix 40 Mg Tabs (Furosemide) .Marland Kitchen... Take 1 tablet by mouth once a day  Problem # 3:  CHEST PAIN-UNSPECIFIED (ICD-786.50) Followed by Delton See- his notes are reviewed...  Problem # 4:  ? 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) Continue the ASA 81mg /d...  Problem # 5:  HYPERCHOLESTEROLEMIA (ICD-272.0) She will ret for FLP next week (currently using up her supply of Crestor5 before changing to Simva20)... Her updated medication list for this problem includes:    Simvastatin 20 Mg Tabs (Simvastatin) .Marland Kitchen... 1 by mouth at bedtime  Problem # 6:  GERD (ICD-530.81) She will continue on the Prevacid30 Bid... +antireflux regimen etc... Her updated medication list for this problem includes:    Prevacid 30 Mg Cpdr (Lansoprazole) .Marland Kitchen... Take 1 capsule by mouth twice times a day    Mag-oxide 400 Mg Tabs (Magnesium oxide) .Marland Kitchen... Take 1 tab by mouth once daily...  Problem # 7:  DIVERTICULOSIS OF COLON (ICD-562.10) S/p colonoscopy 9/10 w/ divertics only...  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  Problem # 9:  DEGENERATIVE JOINT DISEASE (ICD-715.90) S/p left THR by DrGioffre in 2004... Her updated medication list for this problem includes:    Adult Aspirin Low Strength 81 Mg Tbdp (Aspirin) .Marland Kitchen... Take 1 tablet by mouth once a day  Problem # 10:  OSTEOPOROSIS  (ICD-733.00) Last BMD was-  Her updated medication list for this problem includes:    Evista 60 Mg Tabs (Raloxifene hcl) .Marland Kitchen... Take 1 tablet by mouth once a day  Problem # 11:  OTHER MEDICAL PROBLEMS AS NOTED>>>  Complete Medication List: 1)  Allergy Shot  .... Weekly 2)  Allegra 180 Mg Tabs (Fexofenadine hcl) .... Take 1 tab by mouth daily as needed for allergies.Marland KitchenMarland Kitchen 3)  Albuterol Sulfate (2.5 Mg/18ml) 0.083% Nebu (Albuterol sulfate) .... As needed 4)  Symbicort 160-4.5 Mcg/act Aero (Budesonide-formoterol fumarate) .... Inhale 2 sprays two times a day.Marland Kitchen 5)  Proair Hfa 108 (90 Base) Mcg/act Aers (Albuterol sulfate) .... Inhale 2 puffs q 6h as needed for wheezing... 6)  Adult Aspirin Low Strength 81 Mg Tbdp (Aspirin) .... Take 1 tablet by mouth once a day 7)  Norvasc 10 Mg Tabs (Amlodipine besylate) .... Take 1 tablet by mouth once a day 8)  Losartan Potassium 100 Mg Tabs (Losartan potassium) .Marland Kitchen.. 1 by mouth once daily 9)  Minipress 5 Mg Caps (Prazosin hcl) .... Take one tablet by mouth two times a day 10)  Lasix 40 Mg Tabs (Furosemide) .... Take 1 tablet by mouth once a day 11)  Simvastatin 20 Mg Tabs (Simvastatin) .Marland Kitchen.. 1 by mouth at bedtime 12)  Co Q-10 100 Mg Caps (Coenzyme q10) .... Take 1 cap daily... 13)  Prevacid 30 Mg Cpdr (Lansoprazole) .... Take 1 capsule by mouth twice times a day 14)  Elmiron 100 Mg Caps (Pentosan polysulfate sodium) .... Take one tablet by mouth three times a day 15)  Ddavp 0.2 Mg Tabs (Desmopressin acetate) .... Take 3 tablets by mouth at bedtime 16)  Evista 60 Mg Tabs (Raloxifene hcl) .... Take 1 tablet by mouth once a day 17)  Calcium 1500 Mg Tabs (Calcium carbonate) .... Take one tablet by mouth once daily. 18)  Multivitamins Tabs (Multiple vitamin) .... Take one tablet by mouth once daily. 19)  Vitamin C 500 Mg Tabs (Ascorbic acid) .... Take one tablet by mouth twice daily. 20)  Cvs Vit D 5000 High-potency 5000 Unit Caps (Cholecalciferol) .... Take 1 cap by  mouth once daily... 21)  Mag-oxide 400 Mg Tabs (Magnesium oxide) .... Take 1 tab by mouth once daily...  Patient Instructions: 1)  Today we updated your med list- see below.... 2)  Continue your current meds the same for now... 3)  Today we did your follow up CXR.Marland KitchenMarland Kitchen 4)  Please return to our lab one morning next week for your FASTING blood work... then call the "phone tree" in a few days for your lab results.Marland KitchenMarland Kitchen 5)  Stay as active as poss, and work on weight reduction.Marland KitchenMarland Kitchen 6)  Call for any problems.Marland KitchenMarland Kitchen 7)  Please schedule a follow-up appointment in 6 months.

## 2010-12-04 NOTE — Assessment & Plan Note (Signed)
Summary: flu shot//lmr   Nurse Visit   Allergies: 1)  ! Phenobarbital 2)  ! Keflex 3)  ! Penicillin  Orders Added: 1)  Admin 1st Vaccine [90471] 2)  Flu Vaccine 32yrs + [16109] Flu Vaccine Consent Questions     Do you have a history of severe allergic reactions to this vaccine? no    Any prior history of allergic reactions to egg and/or gelatin? no    Do you have a sensitivity to the preservative Thimersol? no    Do you have a past history of Guillan-Barre Syndrome? no    Do you currently have an acute febrile illness? no    Have you ever had a severe reaction to latex? no    Vaccine information given and explained to patient? yes    Are you currently pregnant? no    Lot Number:[id number]   Exp Date:05/04/2011   Site Given  Left Deltoid IM  Tammy Scott  August 01, 2010 3:53 PM

## 2010-12-04 NOTE — Assessment & Plan Note (Signed)
Summary: ASTHMA/ MBW   Primary Provider/Referring Provider:  Kriste Basque  CC:  0 days  wheezing, dry cough, and no fcs.  History of Present Illness: 70 y/o WF with known history  of hyperlipidemia, GERD, DJD, HTN and Asthma. Has chronic dyspnea. Baseline activity - walks on treadmill 3x / week .        June 23, 2009 O/V c/o wheezing and shortness of breath last pm.  Reports she saw Rikki Spearing, NP on 8/18 for acute asthma exacerbation/cough.  Rx for prednisone taper and tessalon pearls for cough.  Reports feeling better during the day 8/19 but symptoms flared again last pm requiring an albuterol neb at 2100 and 0300.  She now reports symptom improvement--decreased wheezing and cough.  See ROS.   November 10, 2009 --Presents for an acute office visit. Complains of asthma flare with increased SOB, wheezing, prod cough that cleared since finishing zpak this past Sunday, sinus congestion with clear drainage, ears/eyes itching x2weeks. Wants to change brand name meds d/t cost.    January 31, 2010--Presents for an acute office visit. Complains of headache.   pt states she was shopping at a local store and a display item fell from a shelf onto her head 01-15-10, has been having HA's, tingling sensation and occasionally staggers when walking. When she bent down to look at piece of furniture, piece of decoration fell from above her on the back of head. She filed a report at the store. Since then she has soreness along where it hit her. She did not lose consciousness, no visual speech changes. HA started within 15 min after leaving store. Has had headache since then that comes and goes. Feeels lightheaded when she moves very slightly lasts for few seconds and resolbve. Their was no knot, break in skin .    Mar 08, 2010 Presents for an acute office visit. Complains increaed SOB, ,worse x 10 days,w/  wheezing,dry cough, drainage, tickle in throat, She is leaving to go oot for sick relative. Worried this is going  to get worse. Denies chest pain,  orthopnea, hemoptysis, fever, n/v/d, edema, headache.   Current Medications (verified): 1)  Allergy Shot .... Weekly 2)  Allegra 180 Mg  Tabs (Fexofenadine Hcl) .... Take 1 Tab By Mouth Daily As Needed For Allergies.Marland KitchenMarland Kitchen 3)  Albuterol Sulfate (2.5 Mg/68ml) 0.083%  Nebu (Albuterol Sulfate) .... As Needed 4)  Symbicort 160-4.5 Mcg/act Aero (Budesonide-Formoterol Fumarate) .... Inhale 2 Sprays Two Times A Day.Marland Kitchen 5)  Proair Hfa 108 (90 Base) Mcg/act  Aers (Albuterol Sulfate) .... Inhale 2 Puffs Q 6h As Needed For Wheezing... 6)  Adult Aspirin Low Strength 81 Mg  Tbdp (Aspirin) .... Take 1 Tablet By Mouth Once A Day 7)  Norvasc 10 Mg  Tabs (Amlodipine Besylate) .... Take 1 Tablet By Mouth Once A Day 8)  Losartan Potassium 100 Mg Tabs (Losartan Potassium) .Marland Kitchen.. 1 By Mouth Once Daily 9)  Minipress 5 Mg  Caps (Prazosin Hcl) .... Take One Tablet By Mouth Two Times A Day 10)  Lasix 40 Mg  Tabs (Furosemide) .... Take 1 Tablet By Mouth Once A Day 11)  Simvastatin 20 Mg Tabs (Simvastatin) .Marland Kitchen.. 1 By Mouth At Bedtime 12)  Co Q-10 100 Mg Caps (Coenzyme Q10) .... Take 1 Cap Daily... 13)  Prevacid 30 Mg  Cpdr (Lansoprazole) .... Take 1 Capsule By Mouth Twice Times A Day 14)  Elmiron 100 Mg  Caps (Pentosan Polysulfate Sodium) .... Take One Tablet By Mouth Three Times A  Day 15)  Ddavp 0.2 Mg  Tabs (Desmopressin Acetate) .... Take 3 Tablets By Mouth At Bedtime 16)  Evista 60 Mg  Tabs (Raloxifene Hcl) .... Take 1 Tablet By Mouth Once A Day 17)  Calcium 1500 Mg Tabs (Calcium Carbonate) .... Take One Tablet By Mouth Once Daily. 18)  Multivitamins   Tabs (Multiple Vitamin) .... Take One Tablet By Mouth Once Daily. 19)  Vitamin C 500 Mg  Tabs (Ascorbic Acid) .... Take One Tablet By Mouth Twice Daily. 20)  Cvs Vit D 5000 High-Potency 5000 Unit Caps (Cholecalciferol) .... Take 1 Cap By Mouth Once Daily... 21)  Mag-Oxide 400 Mg Tabs (Magnesium Oxide) .... Take 1 Tab By Mouth Once  Daily...  Allergies (verified): 1)  ! Phenobarbital 2)  ! Keflex 3)  ! Penicillin  Past History:  Past Medical History: Last updated: 12/12/2009  ALLERGIC RHINITIS (ICD-477.9) ASTHMA (ICD-493.90) BRONCHITIS, RECURRENT (ICD-491.9) HYPERTENSION (ICD-401.9) CHEST PAIN-UNSPECIFIED (ICD-786.50) ? 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) VENOUS INSUFFICIENCY (ICD-459.81) HYPERCHOLESTEROLEMIA (ICD-272.0) GERD (ICD-530.81) DIVERTICULOSIS OF COLON (ICD-562.10) INTERSTITIAL CYSTITIS (ICD-595.1) RENAL CALCULUS, HX OF (ICD-V13.01) DEGENERATIVE JOINT DISEASE (ICD-715.90) OSTEOPOROSIS (ICD-733.00) HEADACHE (ICD-784.0) Hx of MALIGNANT MELANOMA (ICD-172.9)  Past Surgical History: Last updated: 12/12/2009 S/P left THR 2/04 by DrGioffre S/P melanoma removed from medial left knee area by [facility] in 2006 S/P cystoscopy & basket stone removal from right ureter 4/07 by DrPeterson  Family History: Last updated: August 30, 2009 Father died age 14 from pancreatic cancer Mother died age 62 w/ a heart attack 4 Siblings> 2 Brothers- one died age 20 w/ bile duct cancer 2 Sisters- one died age 36 w/ a heart attack 1Brother Atrial fibrillation  Social History: Last updated: 06/02/2009 Married 2 Children Never smoked Social alcohol Retired  Risk Factors: Smoking Status: never (01/05/2009)  Review of Systems      See HPI  Vital Signs:  Patient profile:   70 year old female Height:      62 inches Weight:      192 pounds O2 Sat:      95 % on Room air Temp:     96.9 degrees F oral Pulse rate:   72 / minute BP sitting:   142 / 84  (left arm) Cuff size:   regular  Vitals Entered By: Elray Buba RN (Mar 08, 2010 11:57 AM)  O2 Flow:  Room air CC: 0 days  wheezing,dry cough, no fcs Is Patient Diabetic? No Comments Medications reviewed with patient  Elray Buba RN  Mar 08, 2010 11:58 AM    Physical Exam  Additional Exam:  WD, Overweight, 70 y/o WF in NAD GENERAL:  Alert & oriented;  pleasant & cooperative HEENT:  [place]/AT, EOM-wnl, PERRLA, EACs-clear,  NOSE-clear, THROAT-no erythema or exudate NECK:  Supple w/ fairROM; no JVD; normal carotid impulses w/o bruits; no lymphadenopathy. CHEST:  Coarse BS w/ exp wheezing  HEART:  Regular Rhythm; without murmurs/ rubs/ or gallop ABDOMEN:  Obese, soft & nontender; normal bowel sounds EXT: without deformities or edema     Impression & Recommendations:  Problem # 1:  ASTHMA (ICD-493.90)  Flare , recurrent . Will check Rast/IGE REC :  Astepro 2 puffs at bedtime until sample is gone.  Continue on saline nasal rinses as needed  Prednisone taper over next weeks.  Mucinex DM two times a day as needed cough Hydromet as needed  Please contact office for sooner follow up if symptoms do not improve or worsen  follow up in 2-3 weeks will discuss allergy test at  follow up   Orders: T-"RAST" (Allergy Full Profile) IGE ([id number]) Est. Patient Level IV (29562) Prescription Created Electronically [id number])  Medications Added to Medication List This Visit: 1)  Prednisone 10 Mg Tabs (Prednisone) .... 4 tabs for 2 days, then 3 tabs for 2 days, 2 tabs for 2 days, then 1 tab for 2 days, then stop  Complete Medication List: 1)  Allergy Shot  .... Weekly 2)  Allegra 180 Mg Tabs (Fexofenadine hcl) .... Take 1 tab by mouth daily as needed for allergies.Marland KitchenMarland Kitchen 3)  Albuterol Sulfate (2.5 Mg/7ml) 0.083% Nebu (Albuterol sulfate) .... As needed 4)  Symbicort 160-4.5 Mcg/act Aero (Budesonide-formoterol fumarate) .... Inhale 2 sprays two times a day.Marland Kitchen 5)  Proair Hfa 108 (90 Base) Mcg/act Aers (Albuterol sulfate) .... Inhale 2 puffs q 6h as needed for wheezing... 6)  Adult Aspirin Low Strength 81 Mg Tbdp (Aspirin) .... Take 1 tablet by mouth once a day 7)  Norvasc 10 Mg Tabs (Amlodipine besylate) .... Take 1 tablet by mouth once a day 8)  Losartan Potassium 100 Mg Tabs (Losartan potassium) .Marland Kitchen.. 1 by mouth once daily 9)  Minipress 5 Mg Caps (Prazosin  hcl) .... Take one tablet by mouth two times a day 10)  Lasix 40 Mg Tabs (Furosemide) .... Take 1 tablet by mouth once a day 11)  Simvastatin 20 Mg Tabs (Simvastatin) .Marland Kitchen.. 1 by mouth at bedtime 12)  Co Q-10 100 Mg Caps (Coenzyme q10) .... Take 1 cap daily... 13)  Prevacid 30 Mg Cpdr (Lansoprazole) .... Take 1 capsule by mouth twice times a day 14)  Elmiron 100 Mg Caps (Pentosan polysulfate sodium) .... Take one tablet by mouth three times a day 15)  Ddavp 0.2 Mg Tabs (Desmopressin acetate) .... Take 3 tablets by mouth at bedtime 16)  Evista 60 Mg Tabs (Raloxifene hcl) .... Take 1 tablet by mouth once a day 17)  Calcium 1500 Mg Tabs (Calcium carbonate) .... Take one tablet by mouth once daily. 18)  Multivitamins Tabs (Multiple vitamin) .... Take one tablet by mouth once daily. 19)  Vitamin C 500 Mg Tabs (Ascorbic acid) .... Take one tablet by mouth twice daily. 20)  Cvs Vit D 5000 High-potency 5000 Unit Caps (Cholecalciferol) .... Take 1 cap by mouth once daily... 21)  Mag-oxide 400 Mg Tabs (Magnesium oxide) .... Take 1 tab by mouth once daily... 22)  Prednisone 10 Mg Tabs (Prednisone) .... 4 tabs for 2 days, then 3 tabs for 2 days, 2 tabs for 2 days, then 1 tab for 2 days, then stop  Patient Instructions: 1)  Astepro 2 puffs at bedtime until sample is gone.  2)  Continue on saline nasal rinses as needed  3)  Prednisone taper over next weeks.  4)  Mucinex DM two times a day as needed cough 5)  Hydromet as needed  6)  Please contact office for sooner follow up if symptoms do not improve or worsen  7)  follow up in 2-3 weeks will discuss allergy test at follow up  Prescriptions: PREDNISONE 10 MG TABS (PREDNISONE) 4 tabs for 2 days, then 3 tabs for 2 days, 2 tabs for 2 days, then 1 tab for 2 days, then stop  #20 x 0   Entered and Authorized by:   Rubye Oaks NP   Signed by:   Kierre Hintz NP on 03/08/2010   Method used:   Electronically to        [facility]  [address] [phone]* (retail)  [address]       [place], [place]  [address]       Ph: [phone] or [phone]       Fax: [phone]   RxID:   [id number]

## 2010-12-04 NOTE — Assessment & Plan Note (Signed)
Summary: Acute NP office visit - asthma   Primary Provider/Referring Provider:  Kriste Basque  CC:  increased SOB, wheezing, prod cough with clear mucus, and tightness in chest - states is no better since last OV.  finished pred taper and zpak.  History of Present Illness: 70 y/o WF with known history  of hyperlipidemia, GERD, DJD, HTN and Asthma. Has chronic dyspnea. Baseline activity - walks on treadmill 3x / week .        June 23, 2009 O/V c/o wheezing and shortness of breath last pm.  Reports she saw Rikki Spearing, NP on 8/18 for acute asthma exacerbation/cough.  Rx for prednisone taper and tessalon pearls for cough.  Reports feeling better during the day 8/19 but symptoms flared again last pm requiring an albuterol neb at 2100 and 0300.  She now reports symptom improvement--decreased wheezing and cough.  See ROS.   November 10, 2009 --Presents for an acute office visit. Complains of asthma flare with increased SOB, wheezing, prod cough that cleared since finishing zpak this past Sunday, sinus congestion with clear drainage, ears/eyes itching x2weeks. Wants to change brand name meds d/t cost.    January 31, 2010--Presents for an acute office visit. Complains of headache.   pt states she was shopping at a local store and a display item fell from a shelf onto her head 01-15-10, has been having HA's, tingling sensation and occasionally staggers when walking. When she bent down to look at piece of furniture, piece of decoration fell from above her on the back of head. She filed a report at the store. Since then she has soreness along where it hit her. She did not lose consciousness, no visual speech changes. HA started within 15 min after leaving store. Has had headache since then that comes and goes. Feeels lightheaded when she moves very slightly lasts for few seconds and resolbve. Their was no knot, break in skin .    Mar 08, 2010 Presents for an acute office visit. Complains increaed SOB, ,worse x 10  days,w/  wheezing,dry cough, drainage, tickle in throat, She is leaving to go oot for sick relative. Worried this is going to get worse.   Mar 22, 2010--Returns for follow up. Last visit w/ asthma flare, tx w/ steroids  and rhinitis tx. She returns today feels better, not quite back to baseline. Has occasional wheeze. and dry cough. Does have some nocturnal reflux. Last visit we looked at IGE/rash which was neg for allergy profile but IGE elevated 130. We discussed Xolair. She has had 2 flare requiring steroids over last year. We decided for now to to stay on current regimen , and conisder xolair evaluation if she has additional flare ups. Denies chest pain, dyspnea, orthopnea, hemoptysis, fever, n/v/d, edema, headache.   April 06, 2010-Presents for persistent symptoms Complains of wheezing, increased SOB, fatigue/weakness, prod cough with clear mucus. She was seen couple of weeks ago w/ slow to resolve asthma flare. She did improve however over last couple of days cough is coming back w/ wheezing. Cough is mainly dry w/ no discolred mucus. SHe is worried b/c she is going to [place] in couple of weeks and does not want to be sick. Denies chest pain, orthopnea, hemoptysis, fever, n/v/d, edema, headache. recent xray review was w/ no sign of infection. Allergy profile did show elevated IGE. When she return may need to consider Xolair.   05/18/10--Presents for recurrent symptoms. Complains of increased SOB, wheezing, prod cough with clear mucus,  tightness in chest - states is no better since last OV.  finished pred taper and zpak. Brother in Social worker passed away, trip to [place] got canceled. cough is keeping her up night. Wheezing is getting worse.Denies chest pain, , orthopnea, hemoptysis, fever, n/v/d, edema, headache.   Medications Prior to Update: 1)  Allergy Shot .... Weekly 2)  Allegra 180 Mg  Tabs (Fexofenadine Hcl) .... Take 1 Tab By Mouth Daily As Needed For Allergies.Marland KitchenMarland Kitchen 3)  Symbicort 160-4.5 Mcg/act Aero  (Budesonide-Formoterol Fumarate) .... Inhale 2 Sprays Two Times A Day.Marland Kitchen 4)  Adult Aspirin Low Strength 81 Mg  Tbdp (Aspirin) .... Take 1 Tablet By Mouth Once A Day 5)  Norvasc 10 Mg  Tabs (Amlodipine Besylate) .... Take 1 Tablet By Mouth Once A Day 6)  Losartan Potassium 100 Mg Tabs (Losartan Potassium) .Marland Kitchen.. 1 By Mouth Once Daily 7)  Minipress 5 Mg  Caps (Prazosin Hcl) .... Take One Tablet By Mouth Two Times A Day 8)  Lasix 40 Mg  Tabs (Furosemide) .... Take 1 Tablet By Mouth Once A Day 9)  Simvastatin 20 Mg Tabs (Simvastatin) .Marland Kitchen.. 1 By Mouth At Bedtime 10)  Co Q-10 100 Mg Caps (Coenzyme Q10) .... Take 1 Cap Daily... 11)  Prevacid 30 Mg  Cpdr (Lansoprazole) .... Take 1 Capsule By Mouth Twice Times A Day 12)  Elmiron 100 Mg  Caps (Pentosan Polysulfate Sodium) .... Take One Tablet By Mouth Three Times A Day 13)  Ddavp 0.2 Mg  Tabs (Desmopressin Acetate) .... Take 3 Tablets By Mouth At Bedtime 14)  Evista 60 Mg  Tabs (Raloxifene Hcl) .... Take 1 Tablet By Mouth Once A Day 15)  Calcium 1500 Mg Tabs (Calcium Carbonate) .... Take One Tablet By Mouth Once Daily. 16)  Multivitamins   Tabs (Multiple Vitamin) .... Take One Tablet By Mouth Once Daily. 17)  Vitamin C 500 Mg  Tabs (Ascorbic Acid) .... Take One Tablet By Mouth Twice Daily. 18)  Cvs Vit D 5000 High-Potency 5000 Unit Caps (Cholecalciferol) .... Take 1 Cap By Mouth Once Daily... 19)  Mag-Oxide 400 Mg Tabs (Magnesium Oxide) .... Take 1 Tab By Mouth Once Daily... 20)  Astepro 0.15 % Soln (Azelastine Hcl) .... 2 Puffs Two Times A Day 21)  Albuterol Sulfate (2.5 Mg/80ml) 0.083%  Nebu (Albuterol Sulfate) .... As Needed 22)  Proair Hfa 108 (90 Base) Mcg/act  Aers (Albuterol Sulfate) .... Inhale 2 Puffs Q 6h As Needed For Wheezing... 23)  Zithromax Z-Pak 250 Mg Tabs (Azithromycin) .... Take As Directed. 24)  Prednisone 10 Mg Tabs (Prednisone) .... 4 Tabs For 2 Days, Then 3 Tabs For 2 Days, 2 Tabs For 2 Days, Then 1 Tab For 2 Days, Then Stop 25)   Prednisone 10 Mg Tabs (Prednisone) .... 4 Tabs For 3 Days, Then 3 Tabs For 3 Days, 2 Tabs For 3 Days, Then 1 Tab For 3 Days, Then Stop  Current Medications (verified): 1)  Allergy Shot .... Weekly 2)  Allegra 180 Mg  Tabs (Fexofenadine Hcl) .... Take 1 Tab By Mouth Daily As Needed For Allergies.Marland KitchenMarland Kitchen 3)  Symbicort 160-4.5 Mcg/act Aero (Budesonide-Formoterol Fumarate) .... Inhale 2 Sprays Two Times A Day.Marland Kitchen 4)  Adult Aspirin Low Strength 81 Mg  Tbdp (Aspirin) .... Take 1 Tablet By Mouth Once A Day 5)  Norvasc 10 Mg  Tabs (Amlodipine Besylate) .... Take 1 Tablet By Mouth Once A Day 6)  Losartan Potassium 100 Mg Tabs (Losartan Potassium) .Marland Kitchen.. 1 By Mouth Once Daily 7)  Minipress 5  Mg  Caps (Prazosin Hcl) .... Take One Tablet By Mouth Two Times A Day 8)  Lasix 40 Mg  Tabs (Furosemide) .... Take 1 Tablet By Mouth Once A Day 9)  Simvastatin 20 Mg Tabs (Simvastatin) .Marland Kitchen.. 1 By Mouth At Bedtime 10)  Co Q-10 100 Mg Caps (Coenzyme Q10) .... Take 1 Cap Daily... 11)  Prevacid 30 Mg  Cpdr (Lansoprazole) .... Take 1 Capsule By Mouth Twice Times A Day 12)  Elmiron 100 Mg  Caps (Pentosan Polysulfate Sodium) .... Take One Tablet By Mouth Three Times A Day 13)  Ddavp 0.2 Mg  Tabs (Desmopressin Acetate) .... Take 3 Tablets By Mouth At Bedtime 14)  Evista 60 Mg  Tabs (Raloxifene Hcl) .... Take 1 Tablet By Mouth Once A Day 15)  Calcium 1500 Mg Tabs (Calcium Carbonate) .... Take One Tablet By Mouth Once Daily. 16)  Multivitamins   Tabs (Multiple Vitamin) .... Take One Tablet By Mouth Once Daily. 17)  Vitamin C 500 Mg  Tabs (Ascorbic Acid) .... Take One Tablet By Mouth Twice Daily. 18)  Cvs Vit D 5000 High-Potency 5000 Unit Caps (Cholecalciferol) .... Take 1 Cap By Mouth Once Daily... 19)  Mag-Oxide 400 Mg Tabs (Magnesium Oxide) .... Take 1 Tab By Mouth Once Daily... 20)  Astepro 0.15 % Soln (Azelastine Hcl) .... 2 Puffs Two Times A Day 21)  Albuterol Sulfate (2.5 Mg/59ml) 0.083%  Nebu (Albuterol Sulfate) .... As  Needed 22)  Proair Hfa 108 (90 Base) Mcg/act  Aers (Albuterol Sulfate) .... Inhale 2 Puffs Q 6h As Needed For Wheezing... 23)  Pepcid Ac Maximum Strength 20 Mg Tabs (Famotidine) .... Take 1 Tablet By Mouth Once A Day As Needed  Allergies (verified): 1)  ! Phenobarbital 2)  ! Keflex 3)  ! Penicillin  Past History:  Past Medical History: Last updated: 12/12/2009  ALLERGIC RHINITIS (ICD-477.9) ASTHMA (ICD-493.90) BRONCHITIS, RECURRENT (ICD-491.9) HYPERTENSION (ICD-401.9) CHEST PAIN-UNSPECIFIED (ICD-786.50) ? 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) VENOUS INSUFFICIENCY (ICD-459.81) HYPERCHOLESTEROLEMIA (ICD-272.0) GERD (ICD-530.81) DIVERTICULOSIS OF COLON (ICD-562.10) INTERSTITIAL CYSTITIS (ICD-595.1) RENAL CALCULUS, HX OF (ICD-V13.01) DEGENERATIVE JOINT DISEASE (ICD-715.90) OSTEOPOROSIS (ICD-733.00) HEADACHE (ICD-784.0) Hx of MALIGNANT MELANOMA (ICD-172.9)  Past Surgical History: Last updated: 12/12/2009 S/P left THR 2/04 by DrGioffre S/P melanoma removed from medial left knee area by [facility] in 2006 S/P cystoscopy & basket stone removal from right ureter 4/07 by DrPeterson  Family History: Last updated: Aug 13, 2009 Father died age 83 from pancreatic cancer Mother died age 29 w/ a heart attack 4 Siblings> 2 Brothers- one died age 78 w/ bile duct cancer 2 Sisters- one died age 42 w/ a heart attack 1Brother Atrial fibrillation  Social History: Last updated: 06/02/2009 Married 2 Children Never smoked Social alcohol Retired  Risk Factors: Smoking Status: never (04/06/2010)  Review of Systems      See HPI  Vital Signs:  Patient profile:   70 year old female Height:      62 inches Weight:      192.19 pounds BMI:     35.28 O2 Sat:      98 % on Room air Temp:     97.6 degrees F oral Pulse rate:   78 / minute BP sitting:   124 / 76  (left arm) Cuff size:   regular  Vitals Entered By: Boone Master CNA/MA (May 18, 2010 10:43 AM)  O2 Flow:  Room air CC:  increased SOB, wheezing, prod cough with clear mucus, tightness in chest - states is no better since last OV.  finished pred  taper and zpak Is Patient Diabetic? No Comments Medications reviewed with patient Daytime contact number verified with patient. Boone Master CNA/MA  May 18, 2010 10:45 AM    Physical Exam  Additional Exam:  WD, Overweight, 70 y/o WF in NAD GENERAL:  Alert & oriented; pleasant & cooperative HEENT:  [place]/AT, EOM-wnl, PERRLA, EACs-clear,  NOSE-clear, THROAT-no erythema or exudate NECK:  Supple w/ fairROM; no JVD; normal carotid impulses w/o bruits; no lymphadenopathy. CHEST:  Coarse BS w/ faint exp wheeze on forced exp  HEART:  Regular Rhythm; without murmurs/ rubs/ or gallop ABDOMEN:  Obese, soft & nontender; normal bowel sounds EXT: without deformities or edema     Impression & Recommendations:  Problem # 1:  ASTHMA (ICD-493.90)  Recurrent exacerbation  xray w/ no acute findings.  will check CT cinuse to r/o undelrying sinus dz.  rEC:  Stop Symbicort begin Brovana and Budesonide in neb two times a day  Brush , rinse and gargle after use.  Take Allegra in am ,  Begin Chlor tabs-chlorphenarime 4mg  at bedtime  Prednisone taper over next week.  We are checking CT sinus.  Mucinex DM two times a day  Hydromet 1-2 tsp every 4-6 hr as needed cough.  follow up Dr. Kriste Basque in 4 weeks  Please contact office for sooner follow up if symptoms do not improve or worsen   Orders: Radiology Referral (Radiology) T-2 View CXR (71020TC) Est. Patient Level IV (16109)  Medications Added to Medication List This Visit: 1)  Budesonide 0.25 Mg/64ml Susp (Budesonide) .Marland Kitchen.. 1 vial via hhn two times a day 2)  Pepcid Ac Maximum Strength 20 Mg Tabs (Famotidine) .... Take 1 tablet by mouth once a day as needed 3)  Brovana 15 Mcg/48ml Nebu (Arformoterol tartrate) .Marland Kitchen.. 1 vial via hhn two times a day  Complete Medication List: 1)  Allergy Shot  .... Weekly 2)  Allegra 180 Mg Tabs  (Fexofenadine hcl) .... Take 1 tab by mouth daily as needed for allergies.Marland KitchenMarland Kitchen 3)  Budesonide 0.25 Mg/37ml Susp (Budesonide) .Marland Kitchen.. 1 vial via hhn two times a day 4)  Adult Aspirin Low Strength 81 Mg Tbdp (Aspirin) .... Take 1 tablet by mouth once a day 5)  Norvasc 10 Mg Tabs (Amlodipine besylate) .... Take 1 tablet by mouth once a day 6)  Losartan Potassium 100 Mg Tabs (Losartan potassium) .Marland Kitchen.. 1 by mouth once daily 7)  Minipress 5 Mg Caps (Prazosin hcl) .... Take one tablet by mouth two times a day 8)  Lasix 40 Mg Tabs (Furosemide) .... Take 1 tablet by mouth once a day 9)  Simvastatin 20 Mg Tabs (Simvastatin) .Marland Kitchen.. 1 by mouth at bedtime 10)  Co Q-10 100 Mg Caps (Coenzyme q10) .... Take 1 cap daily... 11)  Prevacid 30 Mg Cpdr (Lansoprazole) .... Take 1 capsule by mouth twice times a day 12)  Elmiron 100 Mg Caps (Pentosan polysulfate sodium) .... Take one tablet by mouth three times a day 13)  Ddavp 0.2 Mg Tabs (Desmopressin acetate) .... Take 3 tablets by mouth at bedtime 14)  Evista 60 Mg Tabs (Raloxifene hcl) .... Take 1 tablet by mouth once a day 15)  Calcium 1500 Mg Tabs (Calcium carbonate) .... Take one tablet by mouth once daily. 16)  Multivitamins Tabs (Multiple vitamin) .... Take one tablet by mouth once daily. 17)  Vitamin C 500 Mg Tabs (Ascorbic acid) .... Take one tablet by mouth twice daily. 18)  Cvs Vit D 5000 High-potency 5000 Unit Caps (Cholecalciferol) .... Take 1 cap  by mouth once daily... 19)  Mag-oxide 400 Mg Tabs (Magnesium oxide) .... Take 1 tab by mouth once daily... 20)  Astepro 0.15 % Soln (Azelastine hcl) .... 2 puffs two times a day 21)  Albuterol Sulfate (2.5 Mg/42ml) 0.083% Nebu (Albuterol sulfate) .... As needed 22)  Proair Hfa 108 (90 Base) Mcg/act Aers (Albuterol sulfate) .... Inhale 2 puffs q 6h as needed for wheezing... 23)  Pepcid Ac Maximum Strength 20 Mg Tabs (Famotidine) .... Take 1 tablet by mouth once a day as needed 24)  Brovana 15 Mcg/8ml Nebu (Arformoterol  tartrate) .Marland Kitchen.. 1 vial via hhn two times a day  Patient Instructions: 1)  Stop Symbicort begin Brovana and Budesonide in neb two times a day  2)  Brush , rinse and gargle after use.  3)  Take Allegra in am ,  4)  Begin Chlor tabs-chlorphenarime 4mg  at bedtime  5)  Prednisone taper over next week.  6)  We are checking CT sinus.  7)  Mucinex DM two times a day  8)  Hydromet 1-2 tsp every 4-6 hr as needed cough.  9)  follow up Dr. Kriste Basque in 4 weeks  10)  Please contact office for sooner follow up if symptoms do not improve or worsen  Prescriptions: BROVANA 15 MCG/2ML NEBU (ARFORMOTEROL TARTRATE) 1 vial via [facility] two times a day  #180 x 3   Entered and Authorized by:   Rubye Oaks NP   Signed by:   Tex Conroy NP on 05/18/2010   Method used:   Faxed to ...       [facility]* (retail)             ,          Ph: [phone]       Fax: [phone]   RxID:   [id number] BUDESONIDE 0.25 MG/2ML SUSP (BUDESONIDE) 1 vial via [facility] two times a day  #180 x 3   Entered and Authorized by:   Rubye Oaks NP   Signed by:   Carmesha Morocco NP on 05/18/2010   Method used:   Faxed to ...       [facility]* (retail)             ,          Ph: [phone]       Fax: [phone]   RxID:   [id number]

## 2010-12-04 NOTE — Letter (Signed)
Summary: CMN for Nebulizer/[facility]  CMN for Nebulizer/[facility]   Imported By: Sherian Rein 02/20/2010 13:21:41  _____________________________________________________________________  External Attachment:    Type:   Image     Comment:   External Document

## 2010-12-04 NOTE — Assessment & Plan Note (Signed)
Summary: NP follow up - asthma   Primary Provider/Referring Provider:  Kriste Basque  CC:  follow up .  History of Present Illness: 70 y/o WF with known history  of hyperlipidemia, GERD, DJD, HTN and Asthma. Has chronic dyspnea. Baseline activity - walks on treadmill 3x / week .        June 23, 2009 O/V c/o wheezing and shortness of breath last pm.  Reports she saw Rikki Spearing, NP on 8/18 for acute asthma exacerbation/cough.  Rx for prednisone taper and tessalon pearls for cough.  Reports feeling better during the day 8/19 but symptoms flared again last pm requiring an albuterol neb at 2100 and 0300.  She now reports symptom improvement--decreased wheezing and cough.  See ROS.   November 10, 2009 --Presents for an acute office visit. Complains of asthma flare with increased SOB, wheezing, prod cough that cleared since finishing zpak this past Sunday, sinus congestion with clear drainage, ears/eyes itching x2weeks. Wants to change brand name meds d/t cost.    January 31, 2010--Presents for an acute office visit. Complains of headache.   pt states she was shopping at a local store and a display item fell from a shelf onto her head 01-15-10, has been having HA's, tingling sensation and occasionally staggers when walking. When she bent down to look at piece of furniture, piece of decoration fell from above her on the back of head. She filed a report at the store. Since then she has soreness along where it hit her. She did not lose consciousness, no visual speech changes. HA started within 15 min after leaving store. Has had headache since then that comes and goes. Feeels lightheaded when she moves very slightly lasts for few seconds and resolbve. Their was no knot, break in skin .    Mar 08, 2010 Presents for an acute office visit. Complains increaed SOB, ,worse x 10 days,w/  wheezing,dry cough, drainage, tickle in throat, She is leaving to go oot for sick relative. Worried this is going to get worse.  Mar 22, 2010--Returns for follow up. Last visit w/ asthma flare, tx w/ steroids  and rhinitis tx. She returns today feels better, not quite back to baseline. Has occasional wheeze. and dry cough. Does have some nocturnal reflux. Last visit we looked at IGE/rash which was neg for allergy profile but IGE elevated 130. We discussed Xolair. She has had 2 flare requiring steroids over last year. We decided for now to to stay on current regimen , and conisder xolair evaluation if she has additional flare ups. Denies chest pain, dyspnea, orthopnea, hemoptysis, fever, n/v/d, edema, headache.   Medications Prior to Update: 1)  Allergy Shot .... Weekly 2)  Allegra 180 Mg  Tabs (Fexofenadine Hcl) .... Take 1 Tab By Mouth Daily As Needed For Allergies.Marland KitchenMarland Kitchen 3)  Albuterol Sulfate (2.5 Mg/23ml) 0.083%  Nebu (Albuterol Sulfate) .... As Needed 4)  Symbicort 160-4.5 Mcg/act Aero (Budesonide-Formoterol Fumarate) .... Inhale 2 Sprays Two Times A Day.Marland Kitchen 5)  Proair Hfa 108 (90 Base) Mcg/act  Aers (Albuterol Sulfate) .... Inhale 2 Puffs Q 6h As Needed For Wheezing... 6)  Adult Aspirin Low Strength 81 Mg  Tbdp (Aspirin) .... Take 1 Tablet By Mouth Once A Day 7)  Norvasc 10 Mg  Tabs (Amlodipine Besylate) .... Take 1 Tablet By Mouth Once A Day 8)  Losartan Potassium 100 Mg Tabs (Losartan Potassium) .Marland Kitchen.. 1 By Mouth Once Daily 9)  Minipress 5 Mg  Caps (Prazosin Hcl) .... Take One  Tablet By Mouth Two Times A Day 10)  Lasix 40 Mg  Tabs (Furosemide) .... Take 1 Tablet By Mouth Once A Day 11)  Simvastatin 20 Mg Tabs (Simvastatin) .Marland Kitchen.. 1 By Mouth At Bedtime 12)  Co Q-10 100 Mg Caps (Coenzyme Q10) .... Take 1 Cap Daily... 13)  Prevacid 30 Mg  Cpdr (Lansoprazole) .... Take 1 Capsule By Mouth Twice Times A Day 14)  Elmiron 100 Mg  Caps (Pentosan Polysulfate Sodium) .... Take One Tablet By Mouth Three Times A Day 15)  Ddavp 0.2 Mg  Tabs (Desmopressin Acetate) .... Take 3 Tablets By Mouth At Bedtime 16)  Evista 60 Mg  Tabs (Raloxifene Hcl)  .... Take 1 Tablet By Mouth Once A Day 17)  Calcium 1500 Mg Tabs (Calcium Carbonate) .... Take One Tablet By Mouth Once Daily. 18)  Multivitamins   Tabs (Multiple Vitamin) .... Take One Tablet By Mouth Once Daily. 19)  Vitamin C 500 Mg  Tabs (Ascorbic Acid) .... Take One Tablet By Mouth Twice Daily. 20)  Cvs Vit D 5000 High-Potency 5000 Unit Caps (Cholecalciferol) .... Take 1 Cap By Mouth Once Daily... 21)  Mag-Oxide 400 Mg Tabs (Magnesium Oxide) .... Take 1 Tab By Mouth Once Daily... 22)  Prednisone 10 Mg Tabs (Prednisone) .... 4 Tabs For 2 Days, Then 3 Tabs For 2 Days, 2 Tabs For 2 Days, Then 1 Tab For 2 Days, Then Stop  Current Medications (verified): 1)  Allergy Shot .... Weekly 2)  Allegra 180 Mg  Tabs (Fexofenadine Hcl) .... Take 1 Tab By Mouth Daily As Needed For Allergies.Marland KitchenMarland Kitchen 3)  Albuterol Sulfate (2.5 Mg/12ml) 0.083%  Nebu (Albuterol Sulfate) .... As Needed 4)  Symbicort 160-4.5 Mcg/act Aero (Budesonide-Formoterol Fumarate) .... Inhale 2 Sprays Two Times A Day.Marland Kitchen 5)  Proair Hfa 108 (90 Base) Mcg/act  Aers (Albuterol Sulfate) .... Inhale 2 Puffs Q 6h As Needed For Wheezing... 6)  Adult Aspirin Low Strength 81 Mg  Tbdp (Aspirin) .... Take 1 Tablet By Mouth Once A Day 7)  Norvasc 10 Mg  Tabs (Amlodipine Besylate) .... Take 1 Tablet By Mouth Once A Day 8)  Losartan Potassium 100 Mg Tabs (Losartan Potassium) .Marland Kitchen.. 1 By Mouth Once Daily 9)  Minipress 5 Mg  Caps (Prazosin Hcl) .... Take One Tablet By Mouth Two Times A Day 10)  Lasix 40 Mg  Tabs (Furosemide) .... Take 1 Tablet By Mouth Once A Day 11)  Simvastatin 20 Mg Tabs (Simvastatin) .Marland Kitchen.. 1 By Mouth At Bedtime 12)  Co Q-10 100 Mg Caps (Coenzyme Q10) .... Take 1 Cap Daily... 13)  Prevacid 30 Mg  Cpdr (Lansoprazole) .... Take 1 Capsule By Mouth Twice Times A Day 14)  Elmiron 100 Mg  Caps (Pentosan Polysulfate Sodium) .... Take One Tablet By Mouth Three Times A Day 15)  Ddavp 0.2 Mg  Tabs (Desmopressin Acetate) .... Take 3 Tablets By Mouth At  Bedtime 16)  Evista 60 Mg  Tabs (Raloxifene Hcl) .... Take 1 Tablet By Mouth Once A Day 17)  Calcium 1500 Mg Tabs (Calcium Carbonate) .... Take One Tablet By Mouth Once Daily. 18)  Multivitamins   Tabs (Multiple Vitamin) .... Take One Tablet By Mouth Once Daily. 19)  Vitamin C 500 Mg  Tabs (Ascorbic Acid) .... Take One Tablet By Mouth Twice Daily. 20)  Cvs Vit D 5000 High-Potency 5000 Unit Caps (Cholecalciferol) .... Take 1 Cap By Mouth Once Daily... 21)  Mag-Oxide 400 Mg Tabs (Magnesium Oxide) .... Take 1 Tab By Mouth Once Daily.Marland KitchenMarland Kitchen  Allergies (verified): 1)  ! Phenobarbital 2)  ! Keflex 3)  ! Penicillin  Past History:  Past Medical History: Last updated: 12/12/2009  ALLERGIC RHINITIS (ICD-477.9) ASTHMA (ICD-493.90) BRONCHITIS, RECURRENT (ICD-491.9) HYPERTENSION (ICD-401.9) CHEST PAIN-UNSPECIFIED (ICD-786.50) ? 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) VENOUS INSUFFICIENCY (ICD-459.81) HYPERCHOLESTEROLEMIA (ICD-272.0) GERD (ICD-530.81) DIVERTICULOSIS OF COLON (ICD-562.10) INTERSTITIAL CYSTITIS (ICD-595.1) RENAL CALCULUS, HX OF (ICD-V13.01) DEGENERATIVE JOINT DISEASE (ICD-715.90) OSTEOPOROSIS (ICD-733.00) HEADACHE (ICD-784.0) Hx of MALIGNANT MELANOMA (ICD-172.9)  Past Surgical History: Last updated: 12/12/2009 S/P left THR 2/04 by DrGioffre S/P melanoma removed from medial left knee area by [facility] in 2006 S/P cystoscopy & basket stone removal from right ureter 4/07 by DrPeterson  Family History: Last updated: Aug 27, 2009 Father died age 47 from pancreatic cancer Mother died age 12 w/ a heart attack 4 Siblings> 2 Brothers- one died age 1 w/ bile duct cancer 2 Sisters- one died age 46 w/ a heart attack 1Brother Atrial fibrillation  Social History: Last updated: 06/02/2009 Married 2 Children Never smoked Social alcohol Retired  Risk Factors: Smoking Status: never (01/05/2009)  Review of Systems      See HPI  Vital Signs:  Patient profile:   70 year old  female Height:      62 inches Weight:      191.31 pounds BMI:     35.12 O2 Sat:      95 % on Room air Temp:     97.1 degrees F oral Pulse rate:   73 / minute BP sitting:   130 / 86  (left arm) Cuff size:   regular  Vitals Entered By: Boone Master CNA/MA (Mar 22, 2010 11:59 AM)  O2 Flow:  Room air CC: follow up  Is Patient Diabetic? No Comments Medications reviewed with patient Daytime contact number verified with patient. Boone Master CNA/MA  Mar 22, 2010 11:59 AM    Physical Exam  Additional Exam:  WD, Overweight, 70 y/o WF in NAD GENERAL:  Alert & oriented; pleasant & cooperative HEENT:  /AT, EOM-wnl, PERRLA, EACs-clear,  NOSE-clear, THROAT-no erythema or exudate NECK:  Supple w/ fairROM; no JVD; normal carotid impulses w/o bruits; no lymphadenopathy. CHEST:  Coarse BS w/ faint exp wheeze on forced exp  HEART:  Regular Rhythm; without murmurs/ rubs/ or gallop ABDOMEN:  Obese, soft & nontender; normal bowel sounds EXT: without deformities or edema     Impression & Recommendations:  Problem # 1:  ASTHMA (ICD-493.90)  Recent flare now improving.  consider repeat spirometry on return ov  if continues w/ flare may consider xolair evaluation-she does have elevated IGE at 130.   Orders: Est. Patient Level III (29528) Prescription Created Electronically [phone])  Medications Added to Medication List This Visit: 1)  Astepro 0.15 % Soln (Azelastine hcl) .... 2 puffs two times a day  Complete Medication List: 1)  Allergy Shot  .... Weekly 2)  Allegra 180 Mg Tabs (Fexofenadine hcl) .... Take 1 tab by mouth daily as needed for allergies.Marland KitchenMarland Kitchen 3)  Albuterol Sulfate (2.5 Mg/70ml) 0.083% Nebu (Albuterol sulfate) .... As needed 4)  Symbicort 160-4.5 Mcg/act Aero (Budesonide-formoterol fumarate) .... Inhale 2 sprays two times a day.Marland Kitchen 5)  Proair Hfa 108 (90 Base) Mcg/act Aers (Albuterol sulfate) .... Inhale 2 puffs q 6h as needed for wheezing... 6)  Adult Aspirin Low Strength 81  Mg Tbdp (Aspirin) .... Take 1 tablet by mouth once a day 7)  Norvasc 10 Mg Tabs (Amlodipine besylate) .... Take 1 tablet by mouth once a day 8)  Losartan Potassium 100 Mg Tabs (Losartan  potassium) .Marland Kitchen.. 1 by mouth once daily 9)  Minipress 5 Mg Caps (Prazosin hcl) .... Take one tablet by mouth two times a day 10)  Lasix 40 Mg Tabs (Furosemide) .... Take 1 tablet by mouth once a day 11)  Simvastatin 20 Mg Tabs (Simvastatin) .Marland Kitchen.. 1 by mouth at bedtime 12)  Co Q-10 100 Mg Caps (Coenzyme q10) .... Take 1 cap daily... 13)  Prevacid 30 Mg Cpdr (Lansoprazole) .... Take 1 capsule by mouth twice times a day 14)  Elmiron 100 Mg Caps (Pentosan polysulfate sodium) .... Take one tablet by mouth three times a day 15)  Ddavp 0.2 Mg Tabs (Desmopressin acetate) .... Take 3 tablets by mouth at bedtime 16)  Evista 60 Mg Tabs (Raloxifene hcl) .... Take 1 tablet by mouth once a day 17)  Calcium 1500 Mg Tabs (Calcium carbonate) .... Take one tablet by mouth once daily. 18)  Multivitamins Tabs (Multiple vitamin) .... Take one tablet by mouth once daily. 19)  Vitamin C 500 Mg Tabs (Ascorbic acid) .... Take one tablet by mouth twice daily. 20)  Cvs Vit D 5000 High-potency 5000 Unit Caps (Cholecalciferol) .... Take 1 cap by mouth once daily... 21)  Mag-oxide 400 Mg Tabs (Magnesium oxide) .... Take 1 tab by mouth once daily... 22)  Astepro 0.15 % Soln (Azelastine hcl) .... 2 puffs two times a day  Patient Instructions: 1)  Continue on Astepro 2 puffs two times a day  2)  Continue on saline nasal rinses as needed  3)  Add Pepcid 20mg  at bedtime.   4)  Mucinex DM two times a day as needed cough 5)  Hydromet as needed  6)  follow up Dr. Kriste Basque as scheduled.  7)    Prescriptions: ASTEPRO 0.15 % SOLN (AZELASTINE HCL) 2 puffs two times a day  #1 x 5   Entered and Authorized by:   Rubye Oaks NP   Signed by:   Chantae Soo NP on 03/22/2010   Method used:   Electronically to        [facility]  [address] [id number] (retail)        [address]       [place], [place]  [address]       Ph: [phone] or [phone]       Fax: [phone]   RxID:   [id number]

## 2010-12-06 NOTE — Assessment & Plan Note (Signed)
Summary: Z6X      Allergies Added:   Visit Type:  Follow-up Primary Provider:  Kriste Basque  CC:  hypertension  carotid disease.  History of Present Illness: The patient is seen for followup of hypertension and carotid artery disease.  She's also had some chest pain and shortness of breath in the past.  There has been no proven coronary disease.  The patient is doing well.  She's not having any significant chest pain or shortness of breath.  Current Medications (verified): 1)  Allergy Shot .... Weekly 2)  Allegra 180 Mg  Tabs (Fexofenadine Hcl) .... Take 1 Tab By Mouth Daily As Needed For Allergies.Marland KitchenMarland Kitchen 3)  Astepro 0.15 % Soln (Azelastine Hcl) .... 2 Puffs Two Times A Day 4)  Symbicort 80-4.5 Mcg/act Aero (Budesonide-Formoterol Fumarate) .... 2 Puffs Two Times A Day 5)  Proair Hfa 108 (90 Base) Mcg/act  Aers (Albuterol Sulfate) .... Inhale 2 Puffs Q 6h As Needed For Wheezing... 6)  Adult Aspirin Low Strength 81 Mg  Tbdp (Aspirin) .... Take 1 Tablet By Mouth Once A Day 7)  Norvasc 10 Mg  Tabs (Amlodipine Besylate) .... Take 1 Tablet By Mouth Once A Day 8)  Losartan Potassium 100 Mg Tabs (Losartan Potassium) .Marland Kitchen.. 1 By Mouth Once Daily 9)  Minipress 5 Mg  Caps (Prazosin Hcl) .... Take One Tablet By Mouth Two Times A Day 10)  Lasix 40 Mg  Tabs (Furosemide) .... Take 1 Tablet By Mouth Once A Day 11)  Simvastatin 20 Mg Tabs (Simvastatin) .Marland Kitchen.. 1 By Mouth At Bedtime 12)  Co Q-10 100 Mg Caps (Coenzyme Q10) .... Take 1 Cap Daily... 13)  Prevacid 30 Mg  Cpdr (Lansoprazole) .... Take 1 Capsule By Mouth Twice Times A Day 14)  Pepcid Ac Maximum Strength 20 Mg Tabs (Famotidine) .... Take 1 Tablet By Mouth Once A Day As Needed 15)  Elmiron 100 Mg  Caps (Pentosan Polysulfate Sodium) .... Take One Tablet By Mouth Three Times A Day 16)  Ddavp 0.2 Mg  Tabs (Desmopressin Acetate) .... Take 3 Tablets By Mouth At Bedtime 17)  Evista 60 Mg  Tabs (Raloxifene Hcl) .... Take 1 Tablet By Mouth Once A Day 18)  Calcium  1500 Mg Tabs (Calcium Carbonate) .... Take One Tablet By Mouth Once Daily. 19)  Mag-Oxide 400 Mg Tabs (Magnesium Oxide) .... Take 1 Tab By Mouth Once Daily... 20)  Multivitamins   Tabs (Multiple Vitamin) .... Take One Tablet By Mouth Once Daily. 21)  Vitamin C 500 Mg  Tabs (Ascorbic Acid) .... Take One Tablet By Mouth Twice Daily. 22)  Cvs Vit D 5000 High-Potency 5000 Unit Caps (Cholecalciferol) .... Take 1 Cap By Mouth Once Daily... 23)  Albuterol Sulfate (2.5 Mg/9ml) 0.083% Nebu (Albuterol Sulfate) .... Use One Vial in [place] Three Times A Day As Needed  Allergies (verified): 1)  ! Phenobarbital 2)  ! Keflex 3)  ! Penicillin  Past History:  Past Medical History: ALLERGIC RHINITIS (ICD-477.9) ASTHMA (ICD-493.90) BRONCHITIS, RECURRENT (ICD-491.9) HYPERTENSION (ICD-401.9) CHEST PAIN-UNSPECIFIED (ICD-786.50) ? 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) VENOUS INSUFFICIENCY (ICD-459.81) HYPERCHOLESTEROLEMIA (ICD-272.0) GERD (ICD-530.81) DIVERTICULOSIS OF COLON (ICD-562.10) INTERSTITIAL CYSTITIS (ICD-595.1) RENAL CALCULUS, HX OF (ICD-V13.01) DEGENERATIVE JOINT DISEASE (ICD-715.90) OSTEOPOROSIS (ICD-733.00) HEADACHE (ICD-784.0) Hx of MALIGNANT MELANOMA (ICD-172 Abnormal EKG   normal LV function in the past EF 60%.... echo 2009 Carotid artery disease... Doppler April, 2009.... 0-39% bilateral Mitral valve   question prolapse in the past.... no prolapse by echo 2009  Review of Systems  Patient denies fever, chills, headache, sweats, rash, change in vision, change in hearing, chest pain, cough, nausea vomiting, urinary symptoms.  She does have mild nighttime cramps in her right leg.  All other systems are reviewed and are negative.  Vital Signs:  Patient profile:   70 year old female Height:      62 inches Weight:      199 pounds BMI:     36.53 Pulse rate:   66 / minute BP sitting:   134 / 82  (left arm) Cuff size:   large  Vitals Entered By: Hardin Negus, RMA (November 20, 2010 11:48 AM)  Physical Exam  General:  Patient is stable but overweight. Head:  head is atraumatic. Eyes:  no xanthelasma. Neck:  no jugular venous distention. Chest Wall:  no chest wall tenderness. Lungs:  lungs are clear.  Respiratory effort is nonlabored Heart:  cardiac exam reveals S1-S2.  No clicks or significant murmurs. Abdomen:  abdomen is soft. Msk:  no musculoskeletal deformities. Extremities:  no peripheral edema. Skin:  no skin rashes. Psych:  patient is oriented to person time and place.  Affect is normal.   Impression & Recommendations:  Problem # 1:  * ABNORMAL EKG EKG is done today and reviewed by me.  I compared it to her old tracing.  There is no change.  There is normal sinus rhythm.  There is decreased anterior R wave progression.  No further workup.  Problem # 2:  CAROTID ARTERY DISEASE (ICD-433.10)  Her updated medication list for this problem includes:    Adult Aspirin Low Strength 81 Mg Tbdp (Aspirin) .Marland Kitchen... Take 1 tablet by mouth once a day The patient had mild carotid disease by Doppler in 2009.  She needs a followup study now.  This will be scheduled.  Orders: Carotid Duplex (Carotid Duplex)  Problem # 3:  SHORTNESS OF BREATH (ICD-786.05)  Her updated medication list for this problem includes:    Adult Aspirin Low Strength 81 Mg Tbdp (Aspirin) .Marland Kitchen... Take 1 tablet by mouth once a day    Norvasc 10 Mg Tabs (Amlodipine besylate) .Marland Kitchen... Take 1 tablet by mouth once a day    Losartan Potassium 100 Mg Tabs (Losartan potassium) .Marland Kitchen... 1 by mouth once daily    Lasix 40 Mg Tabs (Furosemide) .Marland Kitchen... Take 1 tablet by mouth once a day She is not having any significant shortness of breath.  No further workup.  Problem # 4:  CHEST PAIN, ATYPICAL, HX OF (ICD-V15.89)  Orders: EKG w/ Interpretation (93000) The patient is not having any significant chest pain.  No further workup is needed.  Problem # 5:  HYPERTENSION (ICD-401.9)  Her updated medication list for  this problem includes:    Adult Aspirin Low Strength 81 Mg Tbdp (Aspirin) .Marland Kitchen... Take 1 tablet by mouth once a day    Norvasc 10 Mg Tabs (Amlodipine besylate) .Marland Kitchen... Take 1 tablet by mouth once a day    Losartan Potassium 100 Mg Tabs (Losartan potassium) .Marland Kitchen... 1 by mouth once daily    Minipress 5 Mg Caps (Prazosin hcl) .Marland Kitchen... Take one tablet by mouth two times a day    Lasix 40 Mg Tabs (Furosemide) .Marland Kitchen... Take 1 tablet by mouth once a day Blood pressure is under good control.  No change in therapy.  Problem # 6:  HYPERCHOLESTEROLEMIA (ICD-272.0)  Her updated medication list for this problem includes:    Simvastatin 20 Mg Tabs (Simvastatin) .Marland Kitchen... 1 by mouth at bedtime Lipids are  being treated.  She may switch to Lipitor now that it is generic.  Patient Instructions: 1)  Your physician has requested that you have a carotid duplex. This test is an ultrasound of the carotid arteries in your neck. It looks at blood flow through these arteries that supply the brain with blood. Allow one hour for this exam. There are no restrictions or special instructions. 2)  Your physician wants you to follow-up in:  1 year.  You will receive a reminder letter in the mail two months in advance. If you don't receive a letter, please call our office to schedule the follow-up appointment.

## 2010-12-06 NOTE — Miscellaneous (Signed)
Summary: BONE DENSITY  Clinical Lists Changes  Orders: Added new Test order of T-Bone Densitometry (77080) - Signed Added new Test order of T-Lumbar Vertebral Assessment (77082) - Signed 

## 2010-12-12 ENCOUNTER — Encounter: Payer: Self-pay | Admitting: Pulmonary Disease

## 2010-12-20 NOTE — Miscellaneous (Signed)
Summary: alendronate faxed to the pharmacy  Medications Added ALENDRONATE SODIUM 70 MG TABS (ALENDRONATE SODIUM) take one tablet by mouth once weekly--first thing in the am on an empty stomach--wait 1 hour to eat       Clinical Lists Changes  Medications: Added new medication of ALENDRONATE SODIUM 70 MG TABS (ALENDRONATE SODIUM) take one tablet by mouth once weekly--first thing in the am on an empty stomach--wait 1 hour to eat - Signed Rx of ALENDRONATE SODIUM 70 MG TABS (ALENDRONATE SODIUM) take one tablet by mouth once weekly--first thing in the am on an empty stomach--wait 1 hour to eat;  #12 x 3;  Signed;  Entered by: Randell Loop CMA;  Authorized by: Michele Mcalpine MD;  Method used: Faxed to [facility] , ,   , Ph: [phone], Fax: [phone]    Prescriptions: ALENDRONATE SODIUM 70 MG TABS (ALENDRONATE SODIUM) take one tablet by mouth once weekly--first thing in the am on an empty stomach--wait 1 hour to eat  #12 x 3   Entered by:   Randell Loop CMA   Authorized by:   Michele Mcalpine MD   Signed by:   Randell Loop CMA on 12/12/2010   Method used:   Faxed to ...       [facility]* (retail)             ,          Ph: [phone]       Fax: [phone]   RxID:   [id number]

## 2011-01-07 ENCOUNTER — Telehealth (INDEPENDENT_AMBULATORY_CARE_PROVIDER_SITE_OTHER): Payer: Self-pay | Admitting: *Deleted

## 2011-01-15 NOTE — Progress Notes (Signed)
Summary: med side effects/ concerns  Phone Note Call from Patient Call back at Home Phone [phone]   Caller: Patient Call For: nadel Summary of Call: concerns re: fosamex. pt has had some difficulty swallowing (has had her esophagus streched twice). she says she read about side effects re: this med.  Initial call taken by: Tivis Ringer, CNA,  January 07, 2011 10:57 AM  Follow-up for Phone Call        Pt states she has not started taking Fosamax yet because of the insert stating "Do not take if you have problems with movement with muscles in esophagus". Please advised. Zackery Barefoot CMA  January 07, 2011 4:23 PM   Per SN--Take on an empty stomach, stay upright, do not lie back down. If pt is not willing to continue with this she can try Reclast which is an IV infusion done once a year and pt has to have an appointment with TP to start. LMOMTCB x 1. Zackery Barefoot CMA  January 07, 2011 5:56 PM   Additional Follow-up for Phone Call Additional follow up Details #1::        Pt informed of Dr Jodelle Green recommendations.  Pt to try this and let us know if this helps. Abigail Miyamoto RN  January 08, 2011 10:42 AM

## 2011-02-19 LAB — DIFFERENTIAL
Basophils Absolute: 0 10*3/uL (ref 0.0–0.1)
Basophils Relative: 0 % (ref 0–1)
Lymphocytes Relative: 26 % (ref 12–46)
Monocytes Absolute: 0.7 10*3/uL (ref 0.1–1.0)
Neutro Abs: 5.5 10*3/uL (ref 1.7–7.7)
Neutrophils Relative %: 66 % (ref 43–77)

## 2011-02-19 LAB — CBC
HCT: 40 % (ref 36.0–46.0)
Hemoglobin: 14 g/dL (ref 12.0–15.0)
MCV: 88.6 fL (ref 78.0–100.0)
Platelets: 237 10*3/uL (ref 150–400)
WBC: 8.4 10*3/uL (ref 4.0–10.5)

## 2011-02-19 LAB — COMPREHENSIVE METABOLIC PANEL
Albumin: 3.3 g/dL — ABNORMAL LOW (ref 3.5–5.2)
BUN: 16 mg/dL (ref 6–23)
Chloride: 105 mEq/L (ref 96–112)
Creatinine, Ser: 0.77 mg/dL (ref 0.4–1.2)
GFR calc non Af Amer: >60 mL/min (ref >60)
Glucose, Bld: 91 mg/dL (ref 70–99)
Total Bilirubin: 0.5 mg/dL (ref 0.3–1.2)

## 2011-02-19 LAB — CULTURE, RESPIRATORY W GRAM STAIN: Culture: NORMAL

## 2011-02-19 LAB — URINE CULTURE
Colony Count: 8000
Special Requests: NEGATIVE

## 2011-02-19 LAB — URINALYSIS, ROUTINE W REFLEX MICROSCOPIC
Bilirubin Urine: NEGATIVE
Glucose, UA: NEGATIVE mg/dL
Hgb urine dipstick: NEGATIVE
Ketones, ur: NEGATIVE mg/dL
Protein, ur: NEGATIVE mg/dL
pH: 6.5 (ref 5.0–8.0)

## 2011-02-19 LAB — EXPECTORATED SPUTUM ASSESSMENT W GRAM STAIN, RFLX TO RESP C

## 2011-02-19 LAB — BRAIN NATRIURETIC PEPTIDE: Pro B Natriuretic peptide (BNP): 39 pg/mL (ref 0.0–100.0)

## 2011-02-21 ENCOUNTER — Other Ambulatory Visit: Payer: Self-pay | Admitting: Adult Health

## 2011-02-22 NOTE — Telephone Encounter (Signed)
Pt last seen in office by SN 11.7.11, told to follow up in 6 months > upcoming appt w/ SN 5.7.12.

## 2011-03-11 ENCOUNTER — Ambulatory Visit (INDEPENDENT_AMBULATORY_CARE_PROVIDER_SITE_OTHER): Payer: Medicare Other | Admitting: Pulmonary Disease

## 2011-03-11 ENCOUNTER — Encounter: Payer: Self-pay | Admitting: Pulmonary Disease

## 2011-03-11 DIAGNOSIS — N3010 Interstitial cystitis (chronic) without hematuria: Secondary | ICD-10-CM

## 2011-03-11 DIAGNOSIS — E7800 Pure hypercholesterolemia, unspecified: Secondary | ICD-10-CM

## 2011-03-11 DIAGNOSIS — I6529 Occlusion and stenosis of unspecified carotid artery: Secondary | ICD-10-CM

## 2011-03-11 DIAGNOSIS — M1990 Unspecified osteoarthritis, unspecified site: Secondary | ICD-10-CM

## 2011-03-11 DIAGNOSIS — I10 Essential (primary) hypertension: Secondary | ICD-10-CM

## 2011-03-11 DIAGNOSIS — I872 Venous insufficiency (chronic) (peripheral): Secondary | ICD-10-CM

## 2011-03-11 DIAGNOSIS — J45909 Unspecified asthma, uncomplicated: Secondary | ICD-10-CM

## 2011-03-11 DIAGNOSIS — M810 Age-related osteoporosis without current pathological fracture: Secondary | ICD-10-CM

## 2011-03-11 DIAGNOSIS — K219 Gastro-esophageal reflux disease without esophagitis: Secondary | ICD-10-CM

## 2011-03-11 DIAGNOSIS — J309 Allergic rhinitis, unspecified: Secondary | ICD-10-CM

## 2011-03-11 NOTE — Patient Instructions (Signed)
Today we updated your med list on our EPIC system...    Continue your current meds the same, and your asthma regimen per DrLeBauer...  We reviewed your Labs from [phone] & we will plan recheck fasting blood work on return...  Let's plan a follow up visit in 6 months, sooner should the need arise.Marland KitchenMarland Kitchen

## 2011-03-11 NOTE — Progress Notes (Signed)
Subjective:    Patient ID: Brianna Mccarty, female    DOB: 1941/04/26, 70 y.o.   MRN: [id number]  HPI 70 y/o WF here for a follow up visit... she has hx refractory asthmatic bronchitis requiring hosp & IV Solumedrol & max bronchodilator Rx etc... in the outpt setting she gets frequent courses of Prednisone & we have endeavored to maximize her inhaled regimen so as to keep her out of the hosp & off the oral steroids as much as poss... she also sees DrESL for allergy shots, and she has seen DrKozlow who felt that she had a major component of reflux causing her refractory airways dis...  ~  June 11, 2010:  she continues w/ freq exac- "required Pred 4 times since 1/11"...using NEB w/ Brovana & Budes Bid, + Symbicort Bid as before (see below- compliance is suspect)... 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 PFT's today are essent WNL & she is reassured... incr anxiety due to husb's TKR & she's caring for him at home...  ~  September 10, 2010:  Our plan was to maximize her inhaled regimen w/ Symbicort + Nebs using Budesonide & Brovana in an attempt to keep her off of Pred esp since her spirometry was essent normal & symptoms out of proportion... she hadn't required Pred since last OV w/ this regimen, but recently was seen by DrESL to renew her allergy shots and he stopped her nebs & put her on Pred again... I indicated that she had too many chefs in the kitchen & it was OK w/ me for DrLeBauer to take over her asthma management & she will check w/ me Q53mo for medical follow up... she is planning 50th anniv trip to [place] & requests ZPak for Prn use (also got prn Pred from DrLeB)... OK Flu shot today.  ~  Mar 11, 2011:  286mo ROV & states she'd been doing well on her regimen of Symbicort 1spBid, & prn Albuterol Neb; but recently had an exac & DrLeBauer started Pred & she is tapering the 10mg  tabs now...  She notes BP controlled, no  chest pain etc & denies cerebral ischemic symptoms; we reviewed last labs from 9/11; see prob list below>        Problem List:  ALLERGIC RHINITIS (ICD-477.9) ASTHMA (ICD-493.90) - she takes ALLEGRA 180mg /d, SYMBICORT 80- 1spBid, PROAIR & NEBS w/ Albuterol per DrESL; in addition she still gets weekly shots from [facility] allergy clinic, & he treats her w/ freq courses of Pred for exacerbations... ~  CXR 6/09 was clear...  ~  CXR 2/10 hosp showed mild peribronch thickening, NAD... ~  PFT 3/10 showed FVC=2.36 (90%), FEV1=1.78 (85%), %1sec=75, mid-flows= 57%pred. ~  4/10: she saw DrKozlow for second opinion & Rx of her AB & GERD... ~  CXR 2/11 showed clear lungs, NAD... f/u 7/11= same (clear, NAD).Marland Kitchen. ~  RAST testing 5/11 showed IgE= 130, & neg rast tests x cockroach +low titer. ~  CXR 7/11 showed mild cardiomeg, clear lungs, NAD. ~  Sinus CT 8/11 showed no signif inflamm dis, mild membrane thickening, min ethmoid changes on right. ~  PFT 8/11 showed FVC=2.04 (71%), FEV1=1.58 (73%), %1sec=78, mid-flows= 70%pred.  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 ~  CTChest 2008 w/ 3mm nodule in RUL- unchanged over 286mo interval, small HH, noncalcif pleural plaques bilat... she is reassured w/ the recent CXR & serial films not showing nodule. ~  no recent resp infections but she likes to keep ZPak handy just in case.  HYPERTENSION (ICD-401.9) &  CHEST PAIN-UNSPECIFIED (ICD-786.50) - controlled on 4 meds:  NORVASC 10mg /d, LOSARTAN 100mg /d, MINIPRESS 5mg Bid, & LASIX 40mg /d... ~  2DEcho 8/09 showed norm LV wall motion & EF= 60-65%, mild incr AoV thickness, mild LA dil & atrial septal aneurysm noted. ~  EKG 9/10 by DrKatz- NSR, WNL... ~  2DEcho 9/10 showed norm LV wall motion & thickness, EF= 60-65%, gr I DD noted, sl dil LA, right side OK...  ~  9/11:  BP= 130/84, and BP's at home are even better- 120's/ 60's per pt... denies HA, visual changes, CP, palipit, dizziness, syncope, edema,  etc... ~  1/12:  She had f/u appt DrKatz- stable, no changes made... ~  5/12:  BP= 122/74 and stable on meds...  ? 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 she had an abn lifeline screen 3/08 w/ left Carotid in the mild/mod range... ~  CDopplers 4/09 showed mild smooth heterogen plaque in bulbs, 0-39% ICA stenoses... ~  f/u CDoppler 1/12 by Delton See showed mild heterogen plaque in bulbs, 0-39% bilat ICAstenoses... 1cm thyr cyst noted- not palpated.  VENOUS INSUFFICIENCY (ICD-459.81) - she knows to avoid sodium, elevate legs, wear support hose, continue the Lasix...  HYPERCHOLESTEROLEMIA (ICD-272.0) - on SIMVASTATIN 20mg /d + CoQ10 daily... prev intol to statins w/ severe leg cramps but the addition of the CoQ10 has made a world of difference to her tolerability... ~  FLP 3/08 on diet alone showed TChol 268, TG 35, HDL 96, LDL 166... ~  FLP 9/08 on Crestor 5mg /d showed TChol 212, TG 37, HDL 73, LDL 127... continue Rx. ~  FLP 12/09 showed TChol 179, TG 38, HDL 78, LDL 93... continue same Rx. ~  FLP 2/11 on Cres5 showed TChol 161, TG 42, HDL 70, LDL 83... pt switched to Simva20 per request. ~  FLP 9/11 on Simva20 showed TChol 209, TG 42, HDL 72, LDL 127... Same med, better diet, get wt down.  GERD (ICD-530.81) - on PREVACID 30mg Bid + max antireflux regimen Qhs. ~  last EGD 6/08 by DrSam showed 4cmHH, gastitis, and stricture- dilated... prev HPylori neg in 2007.  DIVERTICULOSIS OF COLON (ICD-562.10) ~  colonoscopy 3/05 by DrSam showed divertics only... f/u planned 12yrs (+fam hx in uncle). ~  colonoscopy 9/10 by DrStark showed divertics only... he plans f/u 38yrs.  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 she has had DMSO in bladder + taking> ELMIRON 100mg Tid, & DDAVP 0.2mg -3tabsHS (off prev Vesicare)... last DMSO treatment ~5/11 per pt.  RENAL CALCULUS, HX OF (ICD-V13.01) - required cysto & basket stone removal from right ureter in 2007...  DEGENERATIVE  JOINT DISEASE (ICD-715.90) - she is s/p left THR by DrGioffre in 2004...  OSTEOPOROSIS (ICD-733.00) - on EVISTA 60mg /d... + calcium & MVI... ALENDRONATE 70mg /wk started 1/12. ~  labs 12/09 showed Vit D level = 34... advised to start Vit D OTC 1-2,000 u daily... ~  BMD 1/12 showed TScores -0.1 in Spine, and -2.3 in [facility]; we recommended starting FOSAMAX 70mg /wk along w/ her Calcium, MVI, VitD (esp in light of her freq Pred use).  HEADACHE (ICD-784.0) - prev eval at the Headache clinic- 1994 by DrSpillman, and 2003 by DrFreeman...  Hx of MALIGNANT MELANOMA (ICD-172.9) - removed by [facility] in 2006 from medial left knee area...  Health Maintenance - she gets the seasonal Flu vaccine yearly... given f/u PNEUMOVAX at age 41 in 2009...   Past  Surgical History  Procedure Date  . Left total hip replacement 2004    Dr. Darrelyn Hillock  . Melanoma removed from medial rleft knee area 2006    Dr. Margo Aye  . Cystoscopy and basket stone removal right ureter 02/2006    Dr. Vonita Moss    Outpatient Encounter Prescriptions as of 03/11/2011  Medication Sig Dispense Refill  . albuterol (PROVENTIL) (2.5 MG/3ML) 0.083% nebulizer solution Take 2.5 mg by nebulization every 6 (six) hours as needed.        Marland Kitchen alendronate (FOSAMAX) 70 MG tablet Take 70 mg by mouth every 7 (seven) days. Take with a full glass of water on an empty stomach.       Marland Kitchen amLODipine (NORVASC) 10 MG tablet Take 10 mg by mouth daily.        . Ascorbic Acid (VITAMIN C) 500 MG tablet Take 500 mg by mouth daily.        Marland Kitchen aspirin 81 MG tablet Take 81 mg by mouth daily.        . budesonide-formoterol (SYMBICORT) 80-4.5 MCG/ACT inhaler Inhale 1 puff into the lungs 2 (two) times daily as directed by Dr. Corinda Gubler      . Calcium Carbonate (CALCIUM 600) 1500 MG TABS Take 1 tablet by mouth daily.        . Cholecalciferol (VITAMIN D3) 5000 UNITS CAPS Take 1 capsule by mouth daily.        . Coenzyme Q10 (COQ10) 100 MG CAPS Take 1 capsule by mouth daily.        Marland Kitchen  desmopressin (DDAVP) 0.2 MG tablet Take 3 tablets by mouth daily at bedtime       . fexofenadine (ALLEGRA) 180 MG tablet Take 180 mg by mouth daily.        . furosemide (LASIX) 40 MG tablet Take 40 mg by mouth daily.        . lansoprazole (PREVACID) 30 MG capsule Take 30 mg by mouth 2 (two) times daily.        Marland Kitchen losartan (COZAAR) 100 MG tablet Take 100 mg by mouth daily.        . magnesium oxide (MAG-OX) 400 MG tablet Take 400 mg by mouth daily.        . Multiple Vitamin (MULTIVITAMIN) capsule Take 1 capsule by mouth daily.        . pentosan polysulfate (ELMIRON) 100 MG capsule Take 100 mg by mouth 3 (three) times daily before meals.        . prazosin (MINIPRESS) 5 MG capsule Take 5 mg by mouth 2 (two) times daily.        . predniSONE (DELTASONE) 10 MG tablet Take as directed by Dr. Corinda Gubler      . PROAIR HFA 108 (90 BASE) MCG/ACT inhaler INHALE 2 PUFFS EVERY 6 HOURS AS NEEDED FOR WHEEZING  3 Inhaler  3  . raloxifene (EVISTA) 60 MG tablet Take 60 mg by mouth daily.        . simvastatin (ZOCOR) 20 MG tablet Take 20 mg by mouth at bedtime.        Marland Kitchen DISCONTD: azelastine (ASTELIN) 137 MCG/SPRAY nasal spray 2 sprays by Nasal route 2 (two) times daily. Use in each nostril as directed       . DISCONTD: famotidine (PEPCID) 20 MG tablet Take 20 mg by mouth daily.          Allergies  Allergen Reactions  . Cephalexin     REACTION: high fever  . Penicillins  REACTION: severe swelling and hives  . Phenobarbital     REACTION: hives    Review of Systems         See HPI - all other systems neg except as noted... The patient complains of dyspnea on exertion.  The patient denies anorexia, fever, weight loss, weight gain, vision loss, decreased hearing, hoarseness, chest pain, syncope, peripheral edema, prolonged cough, headaches, hemoptysis, abdominal pain, melena, hematochezia, severe indigestion/heartburn, hematuria, incontinence, muscle weakness, suspicious skin lesions, transient blindness,  difficulty walking, depression, unusual weight change, abnormal bleeding, enlarged lymph nodes, and angioedema.     Objective:   Physical Exam      WD, Overweight, 70 y/o WF in NAD... Vital Signs:  Reviewed... GENERAL:  Alert & oriented; pleasant & cooperative... HEENT:  [place]/AT, EOM-wnl, PERRLA, EACs-clear, TMs- some wax, NOSE-clear, THROAT- resolved, clear now. NECK:  Supple w/ fairROM; no JVD; normal carotid impulses w/o bruits; no thyromegaly or nodules palpated; no lymphadenopathy. CHEST:  essentially clear now- without wheezing, rales, or signs of consolidation... min forced end-exp wheeze. HEART:  Regular Rhythm; without murmurs/ rubs/ or gallops heard... ABDOMEN:  Obese, soft & nontender; normal bowel sounds; no organomegaly or masses palpated... EXT: without deformities, mild arthritic changes; no varicose veins/ +venous insuffic/ tr edema. NEURO:  CN's intact;  no focal neuro deficits... DERM:  No lesions noted; no rash etc...   Assessment & Plan:   ASTHMA>  On regimen as directed by drESL, she indicates doing well & only recently had to start Pred for an exac...  HBP>  Controlled on meds, continue same, get wt down...  ?of Cerebrovasc dis> on ASA w/o cerebral ischemic symptoms & CDoppler 1/12 w/o acute change or progressive plaque etc...  CHOL> on Simva20, has added RYR she says, needs better diet & wt reduction...  GI>  Hx GERD stable on PPI Bid...  IC>  Followed by DrPeterson & stable by her report...  DJD/ OSteopenia>  Now on Fosamax along w/ her Calcium, MVI, Vit D...  Other medical problems as noted.Marland KitchenMarland Kitchen

## 2011-03-19 NOTE — Assessment & Plan Note (Signed)
[facility]                         GASTROENTEROLOGY OFFICE NOTE   NAME:[name], Brianna Mccarty                      MRN:          [id number]  DATE:03/16/2007                            DOB:          1940/12/28    A very nice patient who comes in asking about a prescription for  Prevacid.  She said she had been having some increasing dysphagia.  Food  slows down and after she eats it, sharp pains when bending over.  I went  over in detail, [name] previous procedure and she remembers much of  this and the ulcer that she had in her esophagus.  We did not dilate her  at the time because she was quite significantly inflamed and ulcerated  here, so I thought we would wait and she was doing better when I saw her  back, so this never occurred.  She is now having increase in symptoms.  I explained in detail how she gets this narrowing and I think she has  better insight into her situation now.  I told her I would like to put  her on some Prevacid b.i.d., Carafate suspension three or four times  daily, get a barium swallow with a tablet and then do an endoscopy with  dilatation.  This will be set up fracture several weeks from now after  the above procedures are obtained.     Ulyess Mort, MD  Electronically Signed    SML/MedQ  DD: 03/16/2007  DT: 03/17/2007  Job #: [id number]

## 2011-03-19 NOTE — Assessment & Plan Note (Signed)
[facility]                            CARDIOLOGY OFFICE NOTE   NAME:Tarazon, JAKYIA GACCIONE                      MRN:          [id number]  DATE:06/10/2008                            DOB:          May 16, 1941    Ms. Wetherell is here for cardiac evaluation.  She is the wife of another  patient of mine.  She is stable at this time.  The patient has  significant asthma.  She has no known coronary artery disease.  There is  a question of mitral valve prolapse in the past.  Over many months, she  has felt that she has increased shortness of breath.  She thinks it may  well be related to her asthma.  However, there is concern that it may be  related to cardiac disease and she is referred for further evaluation.  Her shortness of breath is primarily with exercise.  She does have  significant hypertension.  She does not have history of diabetes.  There  has not been a prior cardiac event.  She had been cathed at a very young  age and told that there was no obstructive coronary artery disease.   PAST MEDICAL HISTORY:   ALLERGIES:  PHENOBARBITAL, PENICILLIN, and KEFLEX.   MEDICATIONS:  1. Minipress 5 b.i.d.  2. Lasix 40.  3. Elmiron 100 t.i.d.  4. Prevacid.  5. Singulair.  6. Evista.  7. Allegra.  8. DDAVP.  9. Norvasc 10.  10.Diovan 320.  11.Baby aspirin.  12.Symbicort.   OTHER MEDICAL PROBLEMS:  See the list below.   SOCIAL HISTORY:  The patient is married.  She has 2 children.  She does  not smoke.   FAMILY HISTORY:  The patient's mother had a heart attack at age 90.  There is no strong family history of coronary artery disease at a young  age.   REVIEW OF SYSTEMS:  The patient has some fatigue.  She has some reflux,  urinary problems, and arthritis.  She also has migraine headaches and a  history of melanoma that was treated.  Otherwise, her review of systems  is negative.   PHYSICAL EXAMINATION:  VITAL SIGNS:  Weight is 189 pounds.  Blood  pressure is 132/86 with a pulse of 81.  GENERAL:  The patient is oriented to person, time, and place.  Affect is  normal.  HEENT:  No xanthelasma.  She has normal extraocular motion.  NECK:  There are no carotid bruits.  There is no jugular venous  distention.  LUNGS:  Clear.  Respiratory effort is not labored.  CARDIAC:  Reveals an S1 with an S2.  There are no clicks or significant  murmurs.  ABDOMEN:  Soft.  EXTREMITIES:  She has no significant peripheral edema.  She has 2+  distal pulses.   EKG reveals sinus rhythm.  There is decreased anterior R-wave  progression.  She has few scattered PVCs.  She has overall mildly  reduced voltage.   PROBLEMS:  1. History of significant asthma by history.  2. Question of mitral valve prolapse in the past.  3. Abnormal EKG.  4. Significant hypertension.  5. Allergies to PENICILLIN, KEFLEX, and PHENOBARBITAL.  6. Gastroesophageal reflux disease.  7. Diverticulosis.  8. Shortness of breath.   At this point, it is not clear if she has any cardiac disease or not.  We will proceed with a 2-D echo.  I will then see her back for further  evaluation.  We may want to proceed with exercise testing, but I have  not arranged that yet.     Luis Abed, MD, [facility]  Electronically Signed    JDK/MedQ  DD: 06/10/2008  DT: 06/11/2008  Job #: [id number]   cc:   Kerry Kass, M.D. [facility] M. Kriste Basque, MD

## 2011-03-19 NOTE — Assessment & Plan Note (Signed)
[facility]                            CARDIOLOGY OFFICE NOTE   NAME:Brianna Mccarty, Brianna Mccarty                      MRN:          [id number]  DATE:07/06/2008                            DOB:          April 19, 1941    Brianna Mccarty is here for continued cardiology evaluation.  I saw her on  June 10, 2008.  She has significant asthma.  There had been question of  mitral valve prolapse in the past.  She was having increased shortness  of breath.  We decided to assess her further.  There was a mild  abnormality also on her EKG.  When I saw her, we arranged for a 2D echo  and followup.  She is here today.  She had a recent flare of her asthma  and has been treated.  She has a mild cough today.  Overall, she is  stable.  Her 2D echo revealed excellent LV function.  Ejection fraction  was 60-65%.  There was no mitral valve abnormality.  There was no  pulmonary hypertension.  There was no right ventricular dysfunction.   PAST MEDICAL HISTORY:   ALLERGIES:  PHENOBARB, PENICILLIN, and KEFLEX.   MEDICATIONS:  Symbicort, __________,  Veiscare prednisone taper, Z-Pak,  Minipress, Lasix, Prevacid, Singulair, Evista, Allegra, DDAVP at night,  Crestor, Norvasc, and Diovan.   OTHER MEDICAL PROBLEMS:  See the list below.   REVIEW OF SYSTEMS:  Her review of systems today is negative as listed in  the HPI.   PHYSICAL EXAMINATION:  Blood pressure is 125/72 with a pulse of 87.  The  patient is oriented to person, time and place.  Affect is normal.  HEENT  reveals no xanthelasma.  She has normal extraocular motion.  There are  no carotid bruits.  There is no jugular venous distention.  Lungs are  clear.  Respiratory effort is not labored.  Cardiac exam reveals an S1  and S2.  There are no clicks or significant murmurs.  The abdomen is  soft.  She has no peripheral edema.   PROBLEMS:  1. History of significant asthma.  2. Question of mitral valve prolapse, but no mitral valve  prolapse by      her current echocardiogram.  3. Abnormal electrocardiogram.  There is no wall motion abnormality by      echocardiogram.  4. Significant hypertension, treated.  5. Allergies to PENICILLIN, KEFLEX, and PHENOBARB.  6. Gastroesophageal reflux disease.  7. Diverticulosis.  8. Shortness of breath.  I believe that this is pulmonary.  She has no      echo abnormality.  We      will not do any other testing at this time.  I will follow her and      decide if further testing is needed in the future.  I will see her      back in 6 months for follow-up.     Luis Abed, MD, [facility]  Electronically Signed    JDK/MedQ  DD: 07/06/2008  DT: 07/07/2008  Job #: [id number]   cc:   Lonzo Cloud. Kriste Basque, MD  Dennard Nip  Blossom Hoops, M.D. [facility]

## 2011-03-19 NOTE — Discharge Summary (Signed)
NAMEAVIKA, CARBINE               ACCOUNT NO.:  [id number]   MEDICAL RECORD NO.:  [id number]          PATIENT TYPE:  INP   LOCATION:  5530                         FACILITY:  [facility]   PHYSICIAN:  Lonzo Cloud. Kriste Basque, MD     DATE OF BIRTH:  07/10/1941   DATE OF ADMISSION:  12/27/2008  DATE OF DISCHARGE:  12/31/2008                               DISCHARGE SUMMARY   FINAL DIAGNOSES:  1. Admitted December 27, 2008, with refractory asthmatic bronchitis.      The patient was treated with IV Solu-Medrol, IV Avelox, and inhaled      bronchodilators.  She gradually improved and discharged on a      maximum home regimen with office followup.  2. Hypertension, controlled on medications.  3. History of cerebrovascular disease with abnormal lifeline screen      and followup Dopplers at our vascular lab.  4. Hypercholesterolemia on Crestor and CoQ10.  5. Gastroesophageal reflux disease on Prevacid.  6. History of diverticulosis.  7. History of severe interstitial cystitis followed Dr. Vonita Moss on      free medications.  8. Remote history of kidney stones.  9. Degenerative arthritis.  10.Osteoporosis on Evista.  11.History of headaches with previous evaluation in the [facility].  12.History of melanoma without known recurrence.   BRIEF HISTORY AND PHYSICAL:  The patient is a 68-year white female known  to me with multiple problems as noted above.  She has a history of  recurrent asthmatic bronchitis and frequent upper respiratory tract  infections.  She had been seen last in November 2009.  She presented  with a 1-week history of upper respiratory infection/bronchitis with  cough, yellow sputum production, chest congestion, wheezing, and  progressive dyspnea.  She had been seen [facility] on 3  occasions over the week prior to admission and was not getting any  better with therapy.  She was felt to be refractory to outpatient  management and was referred for  hospitalization.   PAST MEDICAL HISTORY:  Please see above problem list.   PHYSICAL EXAMINATION:  GENERAL:  Physical admission at the time of  admission revealed a 70 year old white female in mild distress with  shortness of breath and wheezing.  VITAL SIGNS:  Blood pressure 140/80, pulse 74 per minute and regular  respirations 20 per minute slightly labored, O2 sat 96% on 2 L oxygen by  nasal cannula, temperature 98.2.  HEENT:  Unremarkable.  NECK:  No jugular venous distention, no carotid bruits, no thyromegaly  or lymphadenopathy.  CHEST:  Moderate expiratory wheezing and rhonchi bilaterally with  moderate congestion, but no signs of consolidation.  CARDIAC:  Regular rhythm, normal S1 and S2, grade 1/6 systolic ejection  murmur at the left sternal border without rubs or gallops heard.  ABDOMEN:  Soft and nontender without evidence of organomegaly or masses.  EXTREMITIES:  No cyanosis, clubbing, or edema.  NEUROLOGIC:  Intact without focal abnormalities detected.  SKIN:  Negative.   LABORATORY DATA:  EKG showed normal sinus rhythm and no acute  abnormalities.  Chest x-ray  revealed clear lungs with mild peribronchial  thickening, no acute abnormalities.  CBC showed a hemoglobin of 14,  hematocrit 40, white count 8400 with 66% segs.  Chemistry studies showed  sodium 139, potassium 3.9, chloride 105, CO2 of 27, BUN 16, creatinine  0.7, blood sugar 91, bilirubin 0.5, alk phos 57, SGOT 15, SGPT 16, total  protein 6.8, albumin 3.3, calcium 9.4.  BNP 39.  Urinalysis clear.  Sputum culture, no growth and normal oropharyngeal flora only.   HOSPITAL COURSE:  The patient was admitted with refractory asthmatic  bronchitis.  She was placed on IV Solu-Medrol and given IV Avelox  empirically.  She was given handheld nebulizer treatments with albuterol  4 times a day and extra as needed.  She was given mucolytics in the form  of Mucinex 1200 mg twice a day with plenty of fluids, both orally  and  intravenously.  She was continued on her home blood pressure meds and  the meds for interstitial cystitis.  She was placed on a carbohydrate-  modified diet and allowed to ambulate as desired.  She responded to the  intravenous therapy and we were able to slowly wean her IV Solu-Medrol.  This was discontinued in favor of oral prednisone.  Similarly, the IV  Avelox was changed to the oral form.  Her Symbicort was added back into  the regimen via AeroChamber.  She ambulated on the ward and was felt to  be made-to-be-ready for discharge on December 31, 2008.   MEDICATIONS AT DISCHARGE:  1. Prednisone 20 mg p.o. b.i.d. until return office visit.  2. Nebulizer with albuterol 2.5 mg 4 times a day regularly.  3. Symbicort 160/4.5 two sprays b.i.d. via AeroChamber.  4. Proventil HFA 2 puffs q.4 h. as needed when out and about.  5. Mucinex 600 mg tablets 2 tablets p.o. b.i.d. with plenty of fluids.  6. Singulair 10 mg p.o. daily.  7. Allegra 180 mg p.o. daily.  8. Enteric coated aspirin 81 mg p.o. daily.  9. Norvasc 10 mg p.o. daily.  10.Diovan 320 mg p.o. daily.  11.Minipress 5 mg p.o. b.i.d.  12.Lasix 40 mg p.o. q.a.m.  13.Crestor 5 mg p.o. daily.  14.CoQ10 at 100 mg p.o. daily.  15.Prevacid 30 mg p.o. t.i.d.  16.Vesicare 5 mg p.o. daily.  17.Elmiron 100 mg p.o. t.i.d.  18.DDAVP 0.2 mg take 3 tablets at bedtime.   She is also instructed to take Tylenol 2 tablets every 4 hours as  needed.   CONDITION ON DISCHARGE:  Improved.   DISPOSITION:  The patient will be discharged home where she will rest,  take her medications regularly, and follow up in the office on Thursday,  January 05, 2009, at 2 p.m.      Lonzo Cloud. Kriste Basque, MD  Electronically Signed     Lonzo Cloud. Kriste Basque, MD  Electronically Signed    SMN/MEDQ  D:  12/31/2008  T:  12/31/2008  Job:  [id number]

## 2011-03-22 NOTE — Op Note (Signed)
NAME:  Brianna Mccarty, Brianna Mccarty                         ACCOUNT NO.:  [id number]   MEDICAL RECORD NO.:  [id number]                   PATIENT TYPE:  INP   LOCATION:  0001                                 FACILITY:  [facility]   PHYSICIAN:  Georges Lynch. Gioffre, M.D.             DATE OF BIRTH:  01/23/1941   DATE OF PROCEDURE:  12/16/2002  DATE OF DISCHARGE:                                 OPERATIVE REPORT   PREOPERATIVE DIAGNOSIS:  Degenerative arthritis, left hip.   POSTOPERATIVE DIAGNOSIS:  Degenerative arthritis, left hip.   OPERATION PERFORMED:  Left total hip arthroplasty utilizing the [employer]  system.  I used a Trident cup size 52 mm.  The insert was a 10 degree  polyethylene liner.  The head was a +0 ceramic head.  I utilized three  cancellous bone screws for fixation purposes.  I used a size 9 Secure-Fit  plus stem.   SURGEON:  Georges Lynch. Gioffre, M.D.   ASSISTANT:  1. Philips Montez Morita, M.D.  2. Ebbie Ridge. Paitsel, P.A.   ANESTHESIA:  Spinal.   DESCRIPTION OF PROCEDURE:  Under spinal anesthesia, routine orthopedic  prepping and draping of the left hip was carried out.  Posterolateral  approach to the left hip was carried out.  Bleeders identified and  cauterized.  She had 1 g of IV Ancef preop.  Following this, the  posterolateral incision was carried out.  Bleeders identified and cauterized  and self-retaining retractors were inserted.  The incision was carried out  to the iliotibial band.  I dissected the gluteal muscles by blunt  dissection.  Bleeders were identified and cauterized.  At this time I  inserted the self-retaining retractors deeper in the wound and then great  care was taken to protect the sciatic nerve.  I then detached the external  rotators and the Zickel band and following this, I protected the gluteus  medius and then went down and identified the capsule and did a complete  capsulectomy dislocated the femoral head, amputated, amputated the femoral  head with an  oscillating saw and then inserted my guide pin and reamed and  rasped the femoral canal up to a size 9 Secure-Fit plus stem.  After reaming  and rasping the area out, we then utilized our distal tip.  We reamed up to  a 13.5 distal tip.  We then identified the acetabulum, completed the  capsulectomy, reamed the acetabulum up to a size 50 mm diameter.  We  curetted out a small cyst, packed some bone from the bone shavings into the  cyst.  Following this, we went through out trials and selected a size 52 mm  cup, a Trident type cup after the trials were carried out and we secured the  permanent cup with three cancellous screws.  Following this, we inserted out  permanent 10 degree polyethylene liner.  We then went through trials once  again with our 32 mm  head. We were going to use the +5 head but it was too  long so we went to the +0.  At this time we then inserted our permanent  Secure-Fit size 9 femoral component.  After this, we inserted our permanent  ceramic head +0, reduced the hip, had excellent function, excellent  stability.  We thoroughly irrigated out the area.  We reapproximated the  soft tissue  structures in the usual fashion.  The skin was closed with metal staples.  Sterile Neosporin dressing was applied and she was placed in the knee  immobilizer.                                                Brianna Mccarty, M.D.    RAG/MEDQ  D:  12/16/2002  T:  12/16/2002  Job:  [id number]

## 2011-03-22 NOTE — Assessment & Plan Note (Signed)
[facility]                             PULMONARY OFFICE NOTE   NAME:Brianna Mccarty, Brianna Mccarty                      MRN:          [id number]  DATE:12/04/2006                            DOB:          02/14/1941    DATE OF VISIT:  December 04, 2006.   HISTORY OF PRESENT ILLNESS:  The patient is a very pleasant 70 year old  white female patient of Dr. Jodelle Green who has a known history of  hypertension, hyperlipidemia and asthmatic bronchitis who presents today  related to persistently elevated blood pressures.  The patient has  noticed over the last month her blood pressures have been persistently  elevated, systolic blood pressure's averaging around 130 to 155.  The  patient is maintained currently on Minipress 5 mg twice daily, Norvasc  10 mg daily, Diovan 160 daily.  The patient denies any headaches, chest  pain, palpitations, orthopnea, PND or leg swelling.  The patient reports  she does not feel well when her blood pressure is elevated; she feels  quite weak and run down.   PAST MEDICAL HISTORY:  Is reviewed.   CURRENT MEDICATIONS:  Current medications are reviewed.   PHYSICAL EXAMINATION:  GENERAL APPEARANCE:  The patient is a pleasant  female in no acute distress.  VITAL SIGNS:  She is afebrile with stable vital signs.  Her oxygen  saturation is 99% on room air.  HEENT:  Unremarkable.  NECK:  Supple without adenopathy.  No jugular venous distention.  LUNG:  Sounds are clear to auscultation.  CARDIAC:  Regular rate.  ABDOMEN:  Soft, benign.  EXTREMITIES:  Warm without any edema.   IMPRESSION AND PLANS:  1. Hypertension, currently not optimally controlled.  The patient is      to increase Diovan up to 320 mg daily.  Advised on a low sodium      diet.  Patient will return back here in four weeks or sooner if      needed with Dr. Kriste Basque for followup.  2. Patient has also been advised to bring her formulary.  Patient is      requesting that we try to  provide generics for her medications if      that is possible.      Rubye Oaks, NP  Electronically Signed      Lonzo Cloud. Kriste Basque, MD  Electronically Signed   TP/MedQ  DD: 12/04/2006  DT: 12/04/2006  Job #: [id number]

## 2011-03-22 NOTE — H&P (Signed)
NAME:  Brianna Mccarty, Brianna Mccarty                         ACCOUNT NO.:  [id number]   MEDICAL RECORD NO.:  [id number]                   PATIENT TYPE:  INP   LOCATION:  NA                                   FACILITY:  [facility]   PHYSICIAN:  Georges Lynch. Gioffre, M.D.             DATE OF BIRTH:  January 27, 1941   DATE OF ADMISSION:  DATE OF DISCHARGE:                                HISTORY & PHYSICAL   HISTORY OF PRESENT ILLNESS:  The patient has had left hip pain for  approximately three years.  Initially her pain responded very well to Vioxx,  but as of late the pain has increased and is no longer relieved with anti-  inflammatory medication.  The patient anticipated that she would need a left  hip replacement and has elected to proceed with the surgery at this time.   ALLERGIES:  PENICILLIN causes swelling and hives.  PHENOBARBITAL causes  hives.   CURRENT MEDICATIONS:  1. Prazosin 2 mg twice daily.  2. Clarinex 5 mg once daily.  3. Advair 100/0.50 twice daily.  4. Albuterol inhaler as needed.  5. Norvasc 5 mg daily.  6. Prevacid 30 mg daily.  7. Imitrex p.r.n.  8. Evista 60 mg daily.  9. Lasix 40 mg daily.  10.      Vioxx 50 mg daily.  11.      Darvocet-N 100 as needed for pain.  12.      DDAVP 2 mg, 3 tablets daily.  13.      Singulair 10 mg daily.  14.      Vivactil 10 mg daily.  15.      Elmiron 100 mg three times daily.   PRIMARY CARE Tahisha Hakim:  Dr. Alroy Dust.   PAST MEDICAL HISTORY:  1. Asthma.  2. Gastroesophageal reflux disease.  3. Hiatal hernia.  4. Bronchitis.  5. Gastritis.  6. Hemorrhoids.  7. Hypertension.  8. Kidney stones.  9. Urinary incontinence.  10.      Osteoarthritis.   FAMILY HISTORY:  The patient's brother had pancreatic cancer.  Mother, heart  disease.  Mother, arthritis.  Mother, hypertension.  Mother, diabetes.   SOCIAL HISTORY:  The patient is married.  She does not smoke.  Drinks an  occasional glass of wine.  Has two children.  Her husband and sister  will be  primary caregivers after surgery.  She lives in a single-floor dwelling, one  stair coming in from the garage.   REVIEW OF SYSTEMS:  GENERAL:  Denies weight change, fever, chills, fatigue.  HEENT:  Denies headache, visual changes, tinnitus, hearing loss, sore  throat.  CARDIOVASCULAR:  Denies chest pain, palpitations, orthopnea,  hypertension.  PULMONARY:  Denies dyspnea, wheezing, cough, sputum  production, or hemoptysis.  GASTROINTESTINAL:  Denies dysphagia, nausea,  vomiting, hematemesis, or abdominal pain.  GENITOURINARY:  Denies dysuria,  frequency, urgency, nocturia, hematuria.  ENDOCRINE:  Denies polyuria,  polydipsia, appetite  change, heat or cold intolerance.  MUSCULOSKELETAL:  Pain in left hip, otherwise negative.  NEUROLOGIC:  Denies dizziness,  vertigo, syncope, seizure, or weakness.  SKIN:  Denies itching, rashes,  masses, or moles.   PHYSICAL EXAMINATION:  VITAL SIGNS:  Temperature 97.4, pulse 76,  respirations 18, blood pressure 148/98 right arm sitting.  GENERAL:  Well-developed, well-nourished 70 year old female in no acute  distress.  HEENT:  PERRL.  EOMs intact.  Pharynx clear.  TMs are clear.  NECK:  Supple.  Without masses.  No carotid bruits detected.  CHEST:  Clear to auscultation bilaterally.  No wheezing, rales, or rhonchi  noted.  HEART:  Regular rate and rhythm.  Without murmur, gallop, or rub.  ABDOMEN:  Positive bowel sounds.  Soft and nontender.  No organomegaly or  abnormal masses.  EXTREMITIES:  Exam of the left hip shows painful range of motion.  She is  about 1/2 inch shorter on the left lower extremity as compared to the right.  SKIN:  Warm and dry.   LABORATORY DATA:  X-ray of left hip shows complete collapse of the joint  space significant for aseptic necrosis.   IMPRESSION:  Aseptic necrosis left hip.   PLAN:  The patient will be admitted to [facility] to undergo a  left total hip arthroplasty __________ 12, 2004.      Ebbie Ridge. Paitsel, P.A.                     Ronald A. Darrelyn Hillock, M.D.    Tilden Dome  D:  12/11/2002  T:  12/12/2002  Job:  [id number]

## 2011-03-22 NOTE — Op Note (Signed)
Brianna Mccarty, Brianna Mccarty               ACCOUNT NO.:  [id number]   MEDICAL RECORD NO.:  [id number]          PATIENT TYPE:  AMB   LOCATION:  [facility]                         FACILITY:  [facility]   PHYSICIAN:  Maretta Bees. Vonita Moss, M.D.DATE OF BIRTH:  09-17-1941   DATE OF PROCEDURE:  02/21/2006  DATE OF DISCHARGE:                                 OPERATIVE REPORT   PREOPERATIVE DIAGNOSIS:  Distal right ureteral stone.   POSTOPERATIVE DIAGNOSIS:  Distal right ureteral stone.   PROCEDURE:  Cystoscopy, right ureteroscopy and right ureteroscopic stone  basketing.   SURGEON:  Maretta Bees. Vonita Moss, M.D.   ANESTHESIA:  General.   INDICATIONS:  This lady has had some severe pain, nausea and vomiting from a  3-mm stone in the distal right ureter.  She has been unable to pass this  despite forcing fluids and using Flomax.  She requested removal of the  stone.   DESCRIPTION OF PROCEDURE:  The patient was brought to the operating room and  placed in the lithotomy position and external genitalia were prepped and  draped in usual fashion.  She was cystoscoped and bladder was unremarkable.  I passed a guidewire up the distal right ureter and felt just a slight hang-  up where the stone obviously was located and the sensor wire went up easily.  I then inserted a 6-French rigid ureteroscope and visualized the stone about  2 cm from the UO.  I used a nitinol stone basket and removed the stone in 1  larger fragment and 2 smaller fragments.  I then reinserted the ureteroscope  and there were no residual stone fragments.  There was no injury to the  ureter requiring a double J catheter insertion, so the ureteroscope was  removed, then the bladder was drained with a cystoscope as she was sent to  the recovery room in good condition.  B&O suppository was inserted and she  was given 30 mg of Toradol IV for postop pain relief.      Maretta Bees. Vonita Moss, M.D.  Electronically Signed     LJP/MEDQ  D:  02/21/2006  T:   02/24/2006  Job:  [id number]

## 2011-03-22 NOTE — Assessment & Plan Note (Signed)
[facility]                           GASTROENTEROLOGY OFFICE NOTE   NAME:Brianna Mccarty                      MRN:          [id number]  DATE:07/28/2006                            DOB:          1941-08-16    PROBLEM:  1. Gastroesophageal reflux disease with ulcerative esophagitis.  2. Irritable bowel syndrome.   HISTORY:  Brianna Mccarty comes in today for followup.  She is a 70 year old white  female who had undergone upper endoscopy April 29, 2006, at that time with  complaints of heartburn, reflux symptoms, and dysphagia. She was found to  have a 4 cm hiatal hernia and secondary stricture as well as ulcerative  esophagitis.  She had some mild gastritis as well.  RUT was negative.  Biopsies from the esophagus showed ulcerated and hyperplastic squamous  mucosa, no dysplasia or carcinoma identified.  She has been on Carafate 1 g  4 times a day as well as Prevacid b.i.d. since that time and says that she  is feeling much better and has absolutely no symptoms at this point.  She  specifically denies any dysphagia or odynphagia.  She had been having a full  feeling in her esophagus which has completely resolved as well.   The patient's last colonoscopy was done in 2005 and showed universal  diverticulosis but no polyps.   CURRENT MEDICATIONS:  1. Minipress 5 mg b.i.d.  2. Lasix 40 daily.  3. Elmiron 100 mg 3 times a day  4. Prevacid 30 two p.o. daily.  5. Albuterol metered dose inhaler p.r.n.  6. Singulair 10 mg daily.  7. Advair 100/50 b.i.d.  8. Evista 60 daily.  9. Vivactil 10 mg daily.  10.Allegra daily.  11.DDAVP 2 mg 3 p.o. nightly.  12.Crestor 5 mg daily.  13.Norvasc 10 mg daily.  14.Diovan daily, do not have a dosage.  15.Baby aspirin 81 mg daily.   ALLERGIES AND INTOLERANCES:  1. PHENOBARBITAL.  2. PENICILLIN.  3. KEFLEX.   PHYSICAL EXAMINATION:  Discussion only today.  Weight is 179.4, blood  pressure 90/62, pulse 86.    IMPRESSION:  40. A 70 year old white female with recent ulcerative esophagitis,      symptomatically resolved.  2. Gastroesophageal reflux disease, chronic.  3. Diverticulosis.  4. Family history of colon cancer.   PLAN:  1. As patient is completely asymptomatic at this time, do not feel that      she needs a repeat endoscopy.  Should she develop recurrent dysphagia      symptoms, would plan dilation at that time.  2. Discontinue Carafate.  3. Continue Prevacid 30 mg b.i.d.  Will switch her to tablets at her      request instead of the solutabs as patient does not like the taste.      She was given a prescription today and samples.  4. Followup colonoscopy at 5-year intervals.  5. Followup office visit in 1 year or sooner p.r.n.  Mike Gip, PA-C                                Ulyess Mort, MD   AE/MedQ  DD:  07/28/2006  DT:  07/30/2006  Job #:  [id number]

## 2011-03-22 NOTE — Discharge Summary (Signed)
Brianna Mccarty, Brianna Mccarty                         ACCOUNT NO.:  [id number]   MEDICAL RECORD NO.:  [id number]                   PATIENT TYPE:  INP   LOCATION:  0473                                 FACILITY:  [facility]   PHYSICIAN:  Georges Lynch. Darrelyn Hillock, M.D.             DATE OF BIRTH:  12-May-1941   DATE OF ADMISSION:  12/16/2002  DATE OF DISCHARGE:  12/20/2002                                 DISCHARGE SUMMARY   ADMISSION DIAGNOSES:  1. Osteoarthritis left hip.  2. Aseptic necrosis left hip.  3. Asthma.  4. Gastroesophageal reflux disease.  5. Hiatal hernia.  6. Bronchitis.  7. Gastritis.  8. Hemorrhoids.  9. Hypertension.  10.      Kidney stones.  11.      Urinary incontinence.   DISCHARGE DIAGNOSES:  1. Aseptic necrosis of left hip, status post left total hip arthroplasty.  2. Asthma.  3. Gastroesophageal reflux disease.  4. Hiatal hernia.  5. Bronchitis.  6. Gastritis.  7. Hemorrhoids.  8. Hypertension.  9. Kidney stones.  10.      Urinary incontinence.   PROCEDURE:  The patient was taken to the operating room on December 16, 2002, to undergo a left total hip arthroplasty.  Surgeon:  Dr. Worthy Rancher.  Assistants:  Dr. Ronnell Guadalajara and Ebbie Ridge. Paitsel, P.A.C.  Surgery was  done under spinal anesthesia.   CONSULTATIONS:  None.   BRIEF HISTORY:  The patient is a 70 year old female who has had left hip  pain for approximately three years.  Initially her pain responded very well  to Vioxx but as of late the pain has increased, and she is no longer getting  relief with her anti-inflammatory pain medication.  The patient anticipated  she would need a left hip replacement and has elected to proceed with  surgery at this time.   LABORATORY DATA:  Preadmission CBC:  WBC 6.4, hemoglobin 12.9, hematocrit  37.3, platelet count 273.  Preadmission chemistry:  Normal except slightly  elevated glucose at 116.  Preadmission urinalysis is normal.  The patient's  blood type is B  positive.  Negative antibody screen.  Hemoglobin and  hematocrit were followed throughout hospitalization.  The patient had a  decrease in her hemoglobin to 9.4 before stabilizing prior to discharge.  The patient did not require blood transfusion.   Preadmission EKG:  Normal sinus rhythm, rate of 88.   Preadmission chest x-ray:  No active pulmonary disease.  Preadmission x-ray  of left hip:  Marked osteoarthritis left hip with underlying avascular  necrosis.  Postoperative x-ray of left hip revealed good position and  alignment of the total hip prosthesis.   HOSPITAL COURSE:  The patient was admitted to [facility] on  December 16, 2002, to undergo a left total hip replacement.  The patient  tolerated the procedure well and was allowed to return to the recovery room  and then to  the orthopedic floor to continue postoperative care.  The  patient was placed on PC analgesia for pain control following surgery.  The  PCA was kept until postoperative day #3 when she was weaned over to p.o.  analgesics.  Her hemoglobin and hematocrit were followed throughout her  hospitalization.  She had a decrease in her hemoglobin to 9.2 before  stabilizing at 9.4 prior to discharge.  The patient did not require blood  transfusions.  The patient progressed well and was able to walk two to three  times daily.  She was discharged home on postoperative day #4.   DISPOSITION:  The patient was discharged home on December 20, 2002.   DISCHARGE MEDICATIONS:  1. Percocet 10/650 1 or 2 every six hours as needed for pain.  2. Robaxin 500 mg every six hours as needed for muscle spasm.  3. Trinsicon 1 tablet twice daily for two weeks.  4. The patient can resume her home medications except Vioxx.  5. Coumadin 5 mg 1-1/2 tablets daily at 6 p.m.  6. Keflex 500 mg four times daily for 10 days.   DIET:  As tolerated.   ACTIVITY:  Hip precautions.  [facility] for home PT and care.  Touchdown  weightbearing left lower extremity with walker.   FOLLOW-UP:  The patient will follow up with Dr. Darrelyn Hillock two weeks from the  date of surgery.  The patient should call the office to schedule an  appointment.   CONDITION ON DISCHARGE:  Improved.     Ebbie Ridge. Paitsel, P.A.                     Ronald A. Darrelyn Hillock, M.D.    ZOX/WRUE  D:  01/06/2003  T:  01/06/2003  Job:  [id number]

## 2011-04-19 ENCOUNTER — Other Ambulatory Visit: Payer: Self-pay | Admitting: Pulmonary Disease

## 2011-05-20 ENCOUNTER — Other Ambulatory Visit: Payer: Self-pay | Admitting: Pulmonary Disease

## 2011-07-30 ENCOUNTER — Other Ambulatory Visit: Payer: Self-pay | Admitting: *Deleted

## 2011-07-30 MED ORDER — PRAZOSIN HCL 5 MG PO CAPS: 5.0000 mg | ORAL_CAPSULE | Freq: Two times a day (BID) | ORAL | Status: DC

## 2011-07-30 MED ORDER — SIMVASTATIN 20 MG PO TABS: 20.0000 mg | ORAL_TABLET | Freq: Every day | ORAL | Status: DC

## 2011-07-30 MED ORDER — RALOXIFENE HCL 60 MG PO TABS: 60.0000 mg | ORAL_TABLET | Freq: Every day | ORAL | Status: DC

## 2011-07-30 MED ORDER — FUROSEMIDE 40 MG PO TABS: 40.0000 mg | ORAL_TABLET | Freq: Every day | ORAL | Status: DC

## 2011-07-30 MED ORDER — AMLODIPINE BESYLATE 10 MG PO TABS: 10.0000 mg | ORAL_TABLET | Freq: Every day | ORAL | Status: DC

## 2011-08-27 ENCOUNTER — Telehealth: Payer: Self-pay | Admitting: Pulmonary Disease

## 2011-08-27 MED ORDER — ALBUTEROL SULFATE HFA 108 (90 BASE) MCG/ACT IN AERS: 2.0000 | INHALATION_SPRAY | Freq: Four times a day (QID) | RESPIRATORY_TRACT | Status: DC | PRN

## 2011-08-27 MED ORDER — ALENDRONATE SODIUM 70 MG PO TABS: 70.0000 mg | ORAL_TABLET | ORAL | Status: DC

## 2011-08-27 MED ORDER — LOSARTAN POTASSIUM 100 MG PO TABS: 100.0000 mg | ORAL_TABLET | Freq: Every day | ORAL | Status: DC

## 2011-08-27 NOTE — Telephone Encounter (Signed)
rx that pt has requested have been sent to the local pharmacy.  Called and spoke with pt and she is aware.

## 2011-09-18 ENCOUNTER — Ambulatory Visit (INDEPENDENT_AMBULATORY_CARE_PROVIDER_SITE_OTHER): Payer: Medicare Other | Admitting: Pulmonary Disease

## 2011-09-18 ENCOUNTER — Telehealth: Payer: Self-pay | Admitting: Pulmonary Disease

## 2011-09-18 ENCOUNTER — Encounter: Payer: Self-pay | Admitting: Pulmonary Disease

## 2011-09-18 DIAGNOSIS — K219 Gastro-esophageal reflux disease without esophagitis: Secondary | ICD-10-CM

## 2011-09-18 DIAGNOSIS — Z23 Encounter for immunization: Secondary | ICD-10-CM

## 2011-09-18 DIAGNOSIS — E7800 Pure hypercholesterolemia, unspecified: Secondary | ICD-10-CM

## 2011-09-18 DIAGNOSIS — F419 Anxiety disorder, unspecified: Secondary | ICD-10-CM

## 2011-09-18 DIAGNOSIS — J309 Allergic rhinitis, unspecified: Secondary | ICD-10-CM

## 2011-09-18 DIAGNOSIS — K5730 Diverticulosis of large intestine without perforation or abscess without bleeding: Secondary | ICD-10-CM

## 2011-09-18 DIAGNOSIS — I872 Venous insufficiency (chronic) (peripheral): Secondary | ICD-10-CM

## 2011-09-18 DIAGNOSIS — M810 Age-related osteoporosis without current pathological fracture: Secondary | ICD-10-CM

## 2011-09-18 DIAGNOSIS — M1990 Unspecified osteoarthritis, unspecified site: Secondary | ICD-10-CM

## 2011-09-18 DIAGNOSIS — I6529 Occlusion and stenosis of unspecified carotid artery: Secondary | ICD-10-CM

## 2011-09-18 DIAGNOSIS — N3010 Interstitial cystitis (chronic) without hematuria: Secondary | ICD-10-CM

## 2011-09-18 DIAGNOSIS — J45909 Unspecified asthma, uncomplicated: Secondary | ICD-10-CM

## 2011-09-18 DIAGNOSIS — I10 Essential (primary) hypertension: Secondary | ICD-10-CM

## 2011-09-18 DIAGNOSIS — C439 Malignant melanoma of skin, unspecified: Secondary | ICD-10-CM

## 2011-09-18 MED ORDER — ALENDRONATE SODIUM 70 MG PO TABS: 70.0000 mg | ORAL_TABLET | ORAL | Status: DC

## 2011-09-18 MED ORDER — RALOXIFENE HCL 60 MG PO TABS: 60.0000 mg | ORAL_TABLET | Freq: Every day | ORAL | Status: DC

## 2011-09-18 MED ORDER — LOSARTAN POTASSIUM 100 MG PO TABS: 100.0000 mg | ORAL_TABLET | Freq: Every day | ORAL | Status: DC

## 2011-09-18 MED ORDER — SIMVASTATIN 20 MG PO TABS: 20.0000 mg | ORAL_TABLET | Freq: Every day | ORAL | Status: DC

## 2011-09-18 MED ORDER — ALBUTEROL SULFATE HFA 108 (90 BASE) MCG/ACT IN AERS: 2.0000 | INHALATION_SPRAY | Freq: Four times a day (QID) | RESPIRATORY_TRACT | Status: DC | PRN

## 2011-09-18 MED ORDER — AMLODIPINE BESYLATE 10 MG PO TABS: 10.0000 mg | ORAL_TABLET | Freq: Every day | ORAL | Status: DC

## 2011-09-18 MED ORDER — PRAZOSIN HCL 5 MG PO CAPS: 5.0000 mg | ORAL_CAPSULE | Freq: Two times a day (BID) | ORAL | Status: DC

## 2011-09-18 MED ORDER — LANSOPRAZOLE 30 MG PO CPDR: 30.0000 mg | DELAYED_RELEASE_CAPSULE | Freq: Two times a day (BID) | ORAL | Status: DC

## 2011-09-18 MED ORDER — FUROSEMIDE 40 MG PO TABS: 40.0000 mg | ORAL_TABLET | Freq: Every day | ORAL | Status: DC

## 2011-09-18 NOTE — Telephone Encounter (Signed)
Called spoke with patient, who is requesting printed-off rx's for her simvastin and losartan that were not sent at today's visit.  Pt will be coming by tomorrow for labs and would like to pick these up then.  Rx's printed off for SN to sign and placed up front.  Pt is okay with this and verbalized her understanding.

## 2011-09-18 NOTE — Patient Instructions (Signed)
Today we updated your med list in our EPIC system...    Continue your current medications the same...  Congrats on doing so well recently & keep up the good work...  We gave you the 2012 flu vaccine today...  Please return to our lab one morning soon for your follow up fasting blood work...    Then please call the PHONE TREE in a few days for your results...    Dial [id number] & when prompted enter your patient number followed by the # symbol...    Your patient number is:  [id number]  Wishing you and yours a Happy Thanksgiving & a Altamese Cabal Christmas...  Let's plan a follow up visit in about 6 months.Marland KitchenMarland Kitchen

## 2011-09-19 ENCOUNTER — Other Ambulatory Visit (INDEPENDENT_AMBULATORY_CARE_PROVIDER_SITE_OTHER): Payer: Medicare Other

## 2011-09-19 DIAGNOSIS — E7800 Pure hypercholesterolemia, unspecified: Secondary | ICD-10-CM

## 2011-09-19 DIAGNOSIS — F419 Anxiety disorder, unspecified: Secondary | ICD-10-CM

## 2011-09-19 DIAGNOSIS — M810 Age-related osteoporosis without current pathological fracture: Secondary | ICD-10-CM

## 2011-09-19 DIAGNOSIS — F411 Generalized anxiety disorder: Secondary | ICD-10-CM

## 2011-09-19 DIAGNOSIS — I10 Essential (primary) hypertension: Secondary | ICD-10-CM

## 2011-09-19 DIAGNOSIS — K5730 Diverticulosis of large intestine without perforation or abscess without bleeding: Secondary | ICD-10-CM

## 2011-09-19 LAB — CBC WITH DIFFERENTIAL/PLATELET
Basophils Relative: 0.7 % (ref 0.0–3.0)
Eosinophils Relative: 3.6 % (ref 0.0–5.0)
HCT: 38.7 % (ref 36.0–46.0)
Lymphs Abs: 1.8 10*3/uL (ref 0.7–4.0)
MCHC: 34 g/dL (ref 30.0–36.0)
MCV: 88.5 fl (ref 78.0–100.0)
Monocytes Absolute: 0.5 10*3/uL (ref 0.1–1.0)
Neutro Abs: 3.1 10*3/uL (ref 1.4–7.7)
RBC: 4.38 Mil/uL (ref 3.87–5.11)
WBC: 5.6 10*3/uL (ref 4.5–10.5)

## 2011-09-20 LAB — HEPATIC FUNCTION PANEL
Bilirubin, Direct: 0.1 mg/dL (ref 0.0–0.3)
Total Bilirubin: 0.5 mg/dL (ref 0.3–1.2)

## 2011-09-20 LAB — BASIC METABOLIC PANEL
BUN: 20 mg/dL (ref 6–23)
Creatinine, Ser: 0.7 mg/dL (ref 0.4–1.2)
GFR: 88.93 mL/min (ref >60.00)
Potassium: 4.2 mEq/L (ref 3.5–5.1)

## 2011-09-20 LAB — LIPID PANEL
Cholesterol: 168 mg/dL (ref 0–200)
LDL Cholesterol: 94 mg/dL (ref 0–99)
Total CHOL/HDL Ratio: 2
Triglycerides: 35 mg/dL (ref 0.0–149.0)
VLDL: 7 mg/dL (ref 0.0–40.0)

## 2011-10-02 ENCOUNTER — Telehealth: Payer: Self-pay | Admitting: Pulmonary Disease

## 2011-10-02 NOTE — Telephone Encounter (Signed)
Pt calling for lab results.  SN please advise. thanks

## 2011-10-03 ENCOUNTER — Encounter: Payer: Self-pay | Admitting: Pulmonary Disease

## 2011-10-03 NOTE — Progress Notes (Signed)
Subjective:    Patient ID: Brianna Mccarty, female    DOB: 09/30/1941, 70 y.o.   MRN: [id number]  HPI 70 y/o WF here for a follow up visit... she has hx refractory asthmatic bronchitis requiring hosp & IV Solumedrol & max bronchodilator Rx etc... in the outpt setting she gets frequent courses of Prednisone & we have endeavored to maximize her inhaled regimen so as to keep her out of the hosp & off the oral steroids as much as poss... she also sees DrESL for allergy shots, and she has seen DrKozlow who felt that she had a major component of reflux causing her refractory airways dis...  ~  June 11, 2010:  she continues w/ freq exac- "required Pred 4 times since 1/11"...using NEB w/ Brovana & Budes Bid, + Symbicort Bid as before (see below- compliance is suspect)... 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 PFT's today are essent WNL & she is reassured... incr anxiety due to husb's TKR & she's caring for him at home...  ~  September 10, 2010:  Our plan was to maximize her inhaled regimen w/ Symbicort + Nebs using Budesonide & Brovana in an attempt to keep her off of Pred esp since her spirometry was essent normal & symptoms out of proportion... she hadn't required Pred since last OV w/ this regimen, but recently was seen by DrESL to renew her allergy shots and he stopped her nebs & put her on Pred again... I indicated that she had too many chefs in the kitchen & it was OK w/ me for DrLeBauer to take over her asthma management & she will check w/ me Q417mo for medical follow up... she is planning 50th anniv trip to [place] & requests ZPak for Prn use (also got prn Pred from DrLeB)... OK Flu shot today.  ~  Mar 11, 2011:  17mo ROV & states she'd been doing well on her regimen of Symbicort 1spBid, & prn Albuterol Neb; but recently had an exac & DrLeBauer started Pred & she is tapering the 10mg  tabs now...  She notes BP controlled, no  chest pain etc & denies cerebral ischemic symptoms; we reviewed last labs from 9/11; see prob list below>  ~  September 18, 2011:  17mo ROV & she reports "the best yr in recent memory" and she attributes some of the success to a new supplement "nopalea" that she states cuts down inflamm; her last Pred was 5/12 & doing satis on her other meds at present...  BP controlled on meds; denies CP, palpit, ch in SOB, edema, or cerebral ischemic symptoms; she remains on Simva20 + RYR and f/u FLP looks good; she remains on Prev30Bid & feels she still needs the Bid dosing; IC controlled on meds per Urology; See prob list below>> she requests refills today & 2012 Flu vaccine...         Problem List:  ALLERGIC RHINITIS (ICD-477.9) ASTHMA (ICD-493.90) - she takes ALLEGRA 180mg /d, SYMBICORT 80- 1spBid, PROAIR & NEBS w/ Albuterol per DrESL; in addition she still gets weekly shots from [facility], & he treats her w/ freq courses of Pred for exacerbations... ~  CXR 6/09 was clear...  ~  CXR 2/10 hosp showed mild peribronch thickening, NAD... ~  PFT 3/10 showed FVC=2.36 (90%), FEV1=1.78 (85%), %1sec=75, mid-flows= 57%pred. ~  4/10: she saw DrKozlow for second opinion & Rx of her AB & GERD... ~  CXR  2/11 showed clear lungs, NAD... f/u 7/11= same (clear, NAD).Marland Kitchen. ~  RAST testing 5/11 showed IgE= 130, & neg rast tests x cockroach +low titer. ~  CXR 7/11 showed mild cardiomeg, clear lungs, NAD. ~  Sinus CT 8/11 showed no signif inflamm dis, mild membrane thickening, min ethmoid changes on right. ~  PFT 8/11 showed FVC=2.04 (71%), FEV1=1.58 (73%), %1sec=78, mid-flows= 70%pred. ~  11/12:  She reports no recent infectious exac & doing better on a new supplement "nopalea" which she says reduces inflamm.  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 ~  CTChest 2008 w/ 3mm nodule in RUL- unchanged over 72mo interval, small HH, noncalcif pleural plaques bilat... she is reassured w/ the recent CXR  & serial films not showing nodule. ~  no recent resp infections but she likes to keep ZPak handy just in case.  HYPERTENSION (ICD-401.9) &  CHEST PAIN-UNSPECIFIED (ICD-786.50) - controlled on 4 meds:  NORVASC 10mg /d, LOSARTAN 100mg /d, MINIPRESS 5mg Bid, & LASIX 40mg /d... ~  2DEcho 8/09 showed norm LV wall motion & EF= 60-65%, mild incr AoV thickness, mild LA dil & atrial septal aneurysm noted. ~  EKG 9/10 by DrKatz- NSR, WNL... ~  2DEcho 9/10 showed norm LV wall motion & thickness, EF= 60-65%, gr I DD noted, sl dil LA, right side OK...  ~  9/11:  BP= 130/84, and BP's at home are even better- 120's/ 60's per pt... denies HA, visual changes, CP, palipit, dizziness, syncope, edema, etc... ~  1/12:  She had f/u appt DrKatz- stable, no changes made... ~  5/12:  BP= 122/74 and stable on meds... ~  11/12:  BP= 126/74 & similar at home she says...  ? 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 she had an abn [facility] screen 3/08 w/ left Carotid in the mild/mod range... ~  CDopplers 4/09 showed mild smooth heterogen plaque in bulbs, 0-39% ICA stenoses... ~  f/u CDoppler 1/12 by Delton See showed mild heterogen plaque in bulbs, 0-39% bilat ICAstenoses... 1cm thyr cyst noted- not palpated.  VENOUS INSUFFICIENCY (ICD-459.81) - she knows to avoid sodium, elevate legs, wear support hose, continue the Lasix...  HYPERCHOLESTEROLEMIA (ICD-272.0) - on SIMVASTATIN 20mg /d + CoQ10 daily... prev intol to statins w/ severe leg cramps but the addition of the CoQ10 has made a world of difference to her tolerability... ~  FLP 3/08 on diet alone showed TChol 268, TG 35, HDL 96, LDL 166... ~  FLP 9/08 on Crestor 5mg /d showed TChol 212, TG 37, HDL 73, LDL 127... continue Rx. ~  FLP 12/09 showed TChol 179, TG 38, HDL 78, LDL 93... continue same Rx. ~  FLP 2/11 on Cres5 showed TChol 161, TG 42, HDL 70, LDL 83... pt switched to Simva20 per request. ~  FLP 9/11 on Simva20 showed TChol 209, TG 42, HDL 72, LDL 127...  Same med, better diet, get wt down. ~  FLP 11/12 on Simva20 showed TChol 168, TG 35, HDL 67, LDL 94  GERD (ICD-530.81) - on PREVACID 30mg Bid + max antireflux regimen Qhs. ~  last EGD 6/08 by DrSam showed 4cmHH, gastitis, and stricture- dilated... prev HPylori neg in 2007.  DIVERTICULOSIS OF COLON (ICD-562.10) ~  colonoscopy 3/05 by DrSam showed divertics only... f/u planned 39yrs (+fam hx in uncle). ~  colonoscopy 9/10 by DrStark showed divertics only... he plans f/u 7yrs.  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 she has had DMSO in bladder + taking> ELMIRON 100mg Tid, & DDAVP 0.2mg -3tabsHS (off prev Vesicare)... last DMSO  treatment ~5/11 per pt.  RENAL CALCULUS, HX OF (ICD-V13.01) - required cysto & basket stone removal from right ureter in 2007...  DEGENERATIVE JOINT DISEASE (ICD-715.90) - she is s/p left THR by DrGioffre in 2004...  OSTEOPOROSIS (ICD-733.00) - on EVISTA 60mg /d... + calcium, MVI, & VitD 5000u daily... ALENDRONATE 70mg /wk started 1/12. ~  labs 12/09 showed Vit D level = 34... advised to start Vit D OTC 1-2,000 u daily... ~  BMD 1/12 showed TScores -0.1 in Spine, and -2.3 in [facility]; we recommended starting FOSAMAX 70mg /wk along w/ her Calcium, MVI, VitD 5000u (esp in light of her freq Pred use).  HEADACHE (ICD-784.0) - prev eval at the Headache clinic- 1994 by DrSpillman, and 2003 by DrFreeman...  Hx of MALIGNANT MELANOMA (ICD-172.9) - removed by [facility] in 2006 from medial left knee area...  Health Maintenance - she gets the seasonal Flu vaccine yearly... given f/u PNEUMOVAX at age 36 in 2009...   Past Surgical History  Procedure Date  . Left total hip replacement 2004    Dr. Darrelyn Hillock  . Melanoma removed from medial rleft knee area 2006    Dr. Margo Aye  . Cystoscopy and basket stone removal right ureter 02/2006    Dr. Vonita Moss    Outpatient Encounter Prescriptions as of 09/18/2011  Medication Sig Dispense Refill  . albuterol (PROAIR HFA) 108  (90 BASE) MCG/ACT inhaler Inhale 2 puffs into the lungs every 6 (six) hours as needed for wheezing.  3 Inhaler  3  . albuterol (PROVENTIL) (2.5 MG/3ML) 0.083% nebulizer solution Take 2.5 mg by nebulization every 6 (six) hours as needed.        Marland Kitchen alendronate (FOSAMAX) 70 MG tablet Take 1 tablet (70 mg total) by mouth every 7 (seven) days. Take with a full glass of water on an empty stomach.  12 tablet  3  . amLODipine (NORVASC) 10 MG tablet Take 1 tablet (10 mg total) by mouth daily.  90 tablet  3  . Ascorbic Acid (VITAMIN C) 500 MG tablet Take 500 mg by mouth daily.        Marland Kitchen aspirin 81 MG tablet Take 81 mg by mouth daily.        . budesonide-formoterol (SYMBICORT) 80-4.5 MCG/ACT inhaler Inhale 1 puff into the lungs 2 (two) times daily. Per Dr. Corinda Gubler      . Calcium Carbonate (CALCIUM 600) 1500 MG TABS Take 1 tablet by mouth daily.        . Cholecalciferol (VITAMIN D3) 5000 UNITS CAPS Take 1 capsule by mouth daily.        . Coenzyme Q10 (COQ10) 100 MG CAPS Take 1 capsule by mouth daily.        Marland Kitchen desmopressin (DDAVP) 0.2 MG tablet Take 3 tablets by mouth daily at bedtime       . fexofenadine (ALLEGRA) 180 MG tablet Take 180 mg by mouth daily.        . furosemide (LASIX) 40 MG tablet Take 1 tablet (40 mg total) by mouth daily.  90 tablet  3  . lansoprazole (PREVACID) 30 MG capsule Take 1 capsule (30 mg total) by mouth 2 (two) times daily.  180 capsule  3  . magnesium oxide (MAG-OX) 400 MG tablet Take 400 mg by mouth daily.        . Multiple Vitamin (MULTIVITAMIN) capsule Take 1 capsule by mouth daily.        . pentosan polysulfate (ELMIRON) 100 MG capsule Take 100 mg by mouth 3 (three) times daily before  meals.        . prazosin (MINIPRESS) 5 MG capsule Take 1 capsule (5 mg total) by mouth 2 (two) times daily.  180 capsule  3  . raloxifene (EVISTA) 60 MG tablet Take 1 tablet (60 mg total) by mouth daily.  90 tablet  3    Allergies  Allergen Reactions  . Cephalexin     REACTION: high fever  .  Penicillins     REACTION: severe swelling and hives  . Phenobarbital     REACTION: hives    Review of Systems         See HPI - all other systems neg except as noted... The patient complains of dyspnea on exertion.  The patient denies anorexia, fever, weight loss, weight gain, vision loss, decreased hearing, hoarseness, chest pain, syncope, peripheral edema, prolonged cough, headaches, hemoptysis, abdominal pain, melena, hematochezia, severe indigestion/heartburn, hematuria, incontinence, muscle weakness, suspicious skin lesions, transient blindness, difficulty walking, depression, unusual weight change, abnormal bleeding, enlarged lymph nodes, and angioedema.     Objective:   Physical Exam      WD, Overweight, 70 y/o WF in NAD... Vital Signs:  Reviewed... GENERAL:  Alert & oriented; pleasant & cooperative... HEENT:  [place]/AT, EOM-wnl, PERRLA, EACs-clear, TMs- some wax, NOSE-clear, THROAT- resolved, clear now. NECK:  Supple w/ fairROM; no JVD; normal carotid impulses w/o bruits; no thyromegaly or nodules palpated; no lymphadenopathy. CHEST:  essentially clear now- without wheezing, rales, or signs of consolidation... min forced end-exp wheeze. HEART:  Regular Rhythm; without murmurs/ rubs/ or gallops heard... ABDOMEN:  Obese, soft & nontender; normal bowel sounds; no organomegaly or masses palpated... EXT: without deformities, mild arthritic changes; no varicose veins/ +venous insuffic/ tr edema. NEURO:  CN's intact;  no focal neuro deficits... DERM:  No lesions noted; no rash etc...   Assessment & Plan:   ASTHMA>  On regimen as directed by DrESL, she indicates doing well & hasn't needed Pred since 5/12 due to "Nopalea" supplement.Marland Kitchen  HBP>  Controlled on meds, continue same, get wt down...  ?of Cerebrovasc dis> on ASA w/o cerebral ischemic symptoms & CDoppler 1/12 w/o acute change or progressive plaque etc...  CHOL> on Simva20, has added RYR she says, & FLP looks good...  GI>  Hx  GERD stable on PPI Bid...  IC>  Followed by DrPeterson & stable by her report...  DJD/ OSteopenia>  Now on Fosamax along w/ her Calcium, MVI, Vit D...  Other medical problems as noted.Marland KitchenMarland Kitchen

## 2011-10-03 NOTE — Telephone Encounter (Signed)
Spoke with pt yesterday about her lab results per SN.  See result note on pts labs.

## 2011-10-21 ENCOUNTER — Other Ambulatory Visit: Payer: Self-pay | Admitting: Pulmonary Disease

## 2011-10-29 ENCOUNTER — Other Ambulatory Visit: Payer: Self-pay | Admitting: Pulmonary Disease

## 2011-11-05 HISTORY — PX: BREAST SURGERY: SHX581

## 2011-11-15 ENCOUNTER — Telehealth: Payer: Self-pay | Admitting: Pulmonary Disease

## 2011-11-15 MED ORDER — ALBUTEROL SULFATE HFA 108 (90 BASE) MCG/ACT IN AERS: 2.0000 | INHALATION_SPRAY | Freq: Four times a day (QID) | RESPIRATORY_TRACT | Status: DC | PRN

## 2011-11-15 MED ORDER — ALBUTEROL SULFATE (2.5 MG/3ML) 0.083% IN NEBU: 2.5000 mg | INHALATION_SOLUTION | Freq: Four times a day (QID) | RESPIRATORY_TRACT | Status: DC | PRN

## 2011-11-15 NOTE — Telephone Encounter (Signed)
Fax # for [facility]: [phone]. Brianna Mccarty

## 2011-11-15 NOTE — Telephone Encounter (Signed)
I spoke with pt and she states she needed her albuterol nebulizer and albuterol inhaler sent to [facility] (mail order). I advised pt will send rx's. Nothing further was needed

## 2011-11-20 ENCOUNTER — Encounter: Payer: Self-pay | Admitting: Gynecology

## 2011-11-21 ENCOUNTER — Other Ambulatory Visit: Payer: Self-pay | Admitting: *Deleted

## 2011-11-21 DIAGNOSIS — N63 Unspecified lump in unspecified breast: Secondary | ICD-10-CM

## 2011-11-22 ENCOUNTER — Other Ambulatory Visit: Payer: Self-pay | Admitting: *Deleted

## 2011-11-22 ENCOUNTER — Other Ambulatory Visit: Payer: Self-pay | Admitting: Gynecology

## 2011-11-22 DIAGNOSIS — N63 Unspecified lump in unspecified breast: Secondary | ICD-10-CM

## 2011-11-22 DIAGNOSIS — R928 Other abnormal and inconclusive findings on diagnostic imaging of breast: Secondary | ICD-10-CM

## 2011-11-25 ENCOUNTER — Other Ambulatory Visit: Payer: Self-pay

## 2011-11-25 HISTORY — PX: BREAST BIOPSY: SHX20

## 2011-11-26 ENCOUNTER — Other Ambulatory Visit: Payer: Self-pay | Admitting: Gynecology

## 2011-11-26 ENCOUNTER — Other Ambulatory Visit: Payer: Self-pay | Admitting: *Deleted

## 2011-11-26 ENCOUNTER — Encounter: Payer: Self-pay | Admitting: Gynecology

## 2011-11-26 DIAGNOSIS — N63 Unspecified lump in unspecified breast: Secondary | ICD-10-CM

## 2011-12-04 ENCOUNTER — Encounter: Payer: Self-pay | Admitting: *Deleted

## 2011-12-11 ENCOUNTER — Ambulatory Visit (INDEPENDENT_AMBULATORY_CARE_PROVIDER_SITE_OTHER): Payer: BC Managed Care – PPO | Admitting: Gynecology

## 2011-12-11 ENCOUNTER — Encounter: Payer: Self-pay | Admitting: Gynecology

## 2011-12-11 VITALS — BP 124/72 | Ht 62.0 in | Wt 194.0 lb

## 2011-12-11 DIAGNOSIS — N952 Postmenopausal atrophic vaginitis: Secondary | ICD-10-CM

## 2011-12-11 DIAGNOSIS — M810 Age-related osteoporosis without current pathological fracture: Secondary | ICD-10-CM

## 2011-12-11 DIAGNOSIS — L900 Lichen sclerosus et atrophicus: Secondary | ICD-10-CM

## 2011-12-11 DIAGNOSIS — L821 Other seborrheic keratosis: Secondary | ICD-10-CM

## 2011-12-11 DIAGNOSIS — L940 Localized scleroderma [morphea]: Secondary | ICD-10-CM

## 2011-12-11 NOTE — Progress Notes (Signed)
Brianna Mccarty 20-Jul-1941 [id number]        71 y.o.  for follow up. History of lichen sclerosus.  Past medical history,surgical history, medications, allergies, family history and social history were all reviewed and documented in the EPIC chart. ROS:  Was performed and pertinent positives and negatives are included in the history.  Exam: Kim chaperone present Filed Vitals:   12/11/11 1359  BP: 124/72   General appearance  Normal Skin grossly normal Head/Neck normal with no cervical or supraclavicular adenopathy thyroid normal Lungs  clear Cardiac RR, without RMG Abdominal  soft, nontender, without masses, organomegaly or hernia Breasts  examined lying and sitting without masses, retractions, discharge or axillary adenopathy. Pelvic  Ext/BUS/vagina  normal atrophic genital changes whitish skin changes symmetrical lateral to the posterior fourchette consistent with her diagnoses  Cervix  normal    Uterus  axial, normal size, shape and contour, midline and mobile nontender   Adnexa  Without masses or tenderness    Anus and perineum  normal   Rectovaginal  normal sphincter tone without palpated masses or tenderness.   Buttocks with classic seborrheic keratoses right upper buttocks.   Assessment/Plan:  71 y.o. female for annual exam.    1. History of lichen sclerosis. She uses Temovate cream intermittently doing well with this and will continue uses as needed. She will call me if she needs a refill for this. 2. Buttocks seborrheic keratoses. Patient has a small classic seborrheic keratoses on her right upper buttocks that she asked me to look at. Options for excision versus observation were discussed. She'll follow it for now if it does change or enlarge to let me know was excised. 3. Osteoporosis. She's been treated for osteoporosis by Dr. Kriste Basque on both Evista and Fosamax. She asked if there was something cheaper than Evista and I asked her to follow up with Dr. Kriste Basque and discuss this  with him as he follows her for this and I have no copies of her bone densities. 4. Mammography. Patient just had mammography January and will continue with annual mammography. SBE monthly reviewed. 5. Colonoscopy. Patient due for colonoscopy this coming-year-old make arrangements to do so. 6. Pap smear. No Pap was done today.  Patient has no history of abnormal Pap smears before with numerous normal reports in her chart the last one in January 2012. I reviewed current screening guidelines and the option to stop altogether if she is over age 70 versus doing a little less frequent interval was reviewed and will further discuss on an annual basis. 7. Health maintenance. No blood work was done this is all done through Dr. Jodelle Green office.  She will see me in a year, sooner as needed.    Dara Lords MD, 2:34 PM 12/11/2011

## 2011-12-11 NOTE — Patient Instructions (Signed)
Return in one year for annual gynecologic follow up. Call if you need more Temovate cream. If buttocks mole enlarges or changes follow up to have excised.

## 2012-01-21 ENCOUNTER — Ambulatory Visit: Payer: Medicare Other | Admitting: Cardiology

## 2012-03-02 ENCOUNTER — Encounter: Payer: Self-pay | Admitting: Cardiology

## 2012-03-02 DIAGNOSIS — I779 Disorder of arteries and arterioles, unspecified: Secondary | ICD-10-CM | POA: Insufficient documentation

## 2012-03-02 DIAGNOSIS — I739 Peripheral vascular disease, unspecified: Secondary | ICD-10-CM

## 2012-03-02 DIAGNOSIS — E041 Nontoxic single thyroid nodule: Secondary | ICD-10-CM | POA: Insufficient documentation

## 2012-03-04 ENCOUNTER — Encounter: Payer: Self-pay | Admitting: Cardiology

## 2012-03-04 ENCOUNTER — Ambulatory Visit (INDEPENDENT_AMBULATORY_CARE_PROVIDER_SITE_OTHER): Payer: BC Managed Care – PPO | Admitting: Cardiology

## 2012-03-04 VITALS — BP 124/72 | HR 76 | Ht 62.0 in | Wt 197.0 lb

## 2012-03-04 DIAGNOSIS — R079 Chest pain, unspecified: Secondary | ICD-10-CM

## 2012-03-04 DIAGNOSIS — I059 Rheumatic mitral valve disease, unspecified: Secondary | ICD-10-CM

## 2012-03-04 DIAGNOSIS — I779 Disorder of arteries and arterioles, unspecified: Secondary | ICD-10-CM

## 2012-03-04 DIAGNOSIS — I10 Essential (primary) hypertension: Secondary | ICD-10-CM

## 2012-03-04 DIAGNOSIS — R0602 Shortness of breath: Secondary | ICD-10-CM

## 2012-03-04 NOTE — Assessment & Plan Note (Signed)
Blood pressure is controlled. No change in therapy. 

## 2012-03-04 NOTE — Assessment & Plan Note (Signed)
She's not having any significant shortness of breath. No change in therapy. 

## 2012-03-04 NOTE — Assessment & Plan Note (Signed)
The patient does not have any significant mitral prolapse. No further workup needed. I'll see her back one year for cardiology followup.

## 2012-03-04 NOTE — Patient Instructions (Signed)
Your physician wants you to follow-up in: 1 year. You will receive a reminder letter in the mail two months in advance. If you don't receive a letter, please call our office to schedule the follow-up appointment.  

## 2012-03-04 NOTE — Progress Notes (Signed)
HPI Patient is seen in followup some chest pain and shortness of breath that she's had in the past. 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 I saw her last January, 2012. As part of today's evaluation I have reviewed all of her cardiac history and updated D. July chronic medical record. She's not having any chest pain or shortness of breath. She had a carotid Doppler in January, 2012. She had mild disease. 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 She is aware of this. Her primary team is aware of this also.   Allergies  Allergen Reactions  . Cephalexin     REACTION: high fever  . Penicillins     REACTION: severe swelling and hives  . Phenobarbital     REACTION: hives    Current Outpatient Prescriptions  Medication Sig Dispense Refill  . albuterol (PROAIR HFA) 108 (90 BASE) MCG/ACT inhaler Inhale 2 puffs into the lungs every 6 (six) hours as needed for wheezing.  3 Inhaler  3  . albuterol (PROVENTIL) (2.5 MG/3ML) 0.083% nebulizer solution Take 3 mLs (2.5 mg total) by nebulization every 6 (six) hours as needed.  360 mL  3  . alendronate (FOSAMAX) 70 MG tablet Take 1 tablet (70 mg total) by mouth every 7 (seven) days. Take with a full glass of water on an empty stomach.  12 tablet  3  . amLODipine (NORVASC) 10 MG tablet Take 1 tablet (10 mg total) by mouth daily.  90 tablet  3  . Ascorbic Acid (VITAMIN C) 500 MG tablet Take 500 mg by mouth daily.        Marland Kitchen aspirin 81 MG tablet Take 81 mg by mouth daily.        . budesonide-formoterol (SYMBICORT) 80-4.5 MCG/ACT inhaler Inhale 1 puff into the lungs daily. Per Dr. Corinda Gubler      . Calcium Carbonate (CALCIUM 600) 1500 MG TABS Take 1 tablet by mouth daily.        . Cholecalciferol (VITAMIN D3) 5000 UNITS CAPS Take 1 capsule by mouth daily.        . Coenzyme Q10 (COQ10) 100 MG CAPS Take 1 capsule by mouth daily.        Marland Kitchen desmopressin (DDAVP) 0.2 MG tablet Take 3 tablets by mouth daily at bedtime       . EVISTA 60 MG tablet TAKE 1 TABLET (60 MG TOTAL)  BY MOUTH DAILY.  30 tablet  5  . fexofenadine (ALLEGRA) 180 MG tablet Take 180 mg by mouth daily.        . furosemide (LASIX) 40 MG tablet Take 1 tablet (40 mg total) by mouth daily.  90 tablet  3  . HYDROcodone-acetaminophen (NORCO) 5-325 MG per tablet Take 1 tablet by mouth every 6 (six) hours as needed.      . lansoprazole (PREVACID) 30 MG capsule Take 1 capsule (30 mg total) by mouth 2 (two) times daily.  180 capsule  3  . losartan (COZAAR) 100 MG tablet Take 1 tablet (100 mg total) by mouth daily.  90 tablet  3  . magnesium oxide (MAG-OX) 400 MG tablet Take 400 mg by mouth daily.        . Meth-Hyo-M Bl-Na Phos-Ph Sal (URIBEL) 118 MG CAPS Take 1 capsule by mouth as needed.      . Multiple Vitamin (MULTIVITAMIN) capsule Take 1 capsule by mouth daily.        . pentosan polysulfate (ELMIRON) 100 MG capsule Take 100 mg by mouth 3 (  three) times daily before meals.        . prazosin (MINIPRESS) 5 MG capsule Take 1 capsule (5 mg total) by mouth 2 (two) times daily.  180 capsule  3  . Probiotic Product (PROBIOTIC PO) Take by mouth.      . simvastatin (ZOCOR) 20 MG tablet Take 1 tablet (20 mg total) by mouth at bedtime.  90 tablet  3    History   Social History  . Marital Status: Married    Spouse Name: Gerilyn Pilgrim    Number of Children: 2  . Years of Education: N/A   Occupational History  . retired    Social History Main Topics  . Smoking status: Never Smoker   . Smokeless tobacco: Never Used  . Alcohol Use: 1.5 oz/week    3 drink(s) per week     social use  . Drug Use: No  . Sexually Active: No   Other Topics Concern  . Not on file   Social History Narrative  . No narrative on file    Family History  Problem Relation Age of Onset  . Cancer Father     Pancreatic  . Diabetes Mother   . Heart disease Mother   . Cancer Brother     Bile duct  . Diabetes Brother   . Diabetes Brother   . Heart disease Brother   . Hypertension Brother   . Hypertension Sister     Past Medical  History  Diagnosis Date  . Allergic rhinitis   . Asthma   . Bronchitis, mucopurulent recurrent   . Hypertension   . Chest pain, unspecified   . Carotid artery disease     Doppler, April, 2009, 0-39% bilateral  . Venous insufficiency   . Hypercholesterolemia   . GERD (gastroesophageal reflux disease)   . Diverticulosis of colon   . Interstitial cystitis   . Renal calculus   . DJD (degenerative joint disease)   . Osteoporosis   . Headache   . Malignant melanoma   . Lichen sclerosus   . Abnormal EKG     Normal LV function in the past  . Ejection fraction     EF 60%, echo, 2009  . Mitral valve disease     Question mitral valve prolapse in the past, no prolapse by echo 2009  . Thyroid cyst     1 x 1.1 thyroid cyst noted on carotid Doppler, January, 2012    Past Surgical History  Procedure Date  . Left total hip replacement 2004    Dr. Darrelyn Hillock  . Melanoma removed from medial rleft knee area 2006    Dr. Margo Aye  . Cystoscopy and basket stone removal right ureter 02/2006    Dr. Vonita Moss  . Cardiac catheterization   . Breast surgery 2013    Breast Bx-Benign  . Nasal septum surgery     ROS  Patient denies fever, chills, headache, sweats, rash, change in hearing, chest pain, cough, nausea vomiting, urinary symptoms. She did have an unusual symptom of seeing some black spots on one occasion. She saw her ophthalmologist. There was no acute abnormality.She  not had any recurrence of this.  PHYSICAL EXAM  Patient is oriented to person time and place. Affect is normal. There is no jugulovenous distention. There no carotid bruits. Lungs are clear. Respiratory effort is nonlabored. The patient is overweight. Cardiac exam reveals S1 and S2. There no clicks or significant murmurs. The abdomen is soft. There is no peripheral edema. There no  musculoskeletal deformities. There are no skin rashes.  Filed Vitals:   03/04/12 1138  BP: 124/72  Pulse: 76  Height: 5\' 2"  (1.575 m)  Weight: 197  lb (89.359 kg)   EKG is done today and reviewed by me. There is normal sinus rhythm. There is no significant abnormality. There is no change from the past.  ASSESSMENT & PLAN

## 2012-03-04 NOTE — Assessment & Plan Note (Signed)
Patient does have mild carotid disease. This is being followed.

## 2012-03-04 NOTE — Assessment & Plan Note (Signed)
She has not had any recurrent chest pain. No further workup. 

## 2012-03-17 ENCOUNTER — Ambulatory Visit: Payer: Medicare Other | Admitting: Pulmonary Disease

## 2012-04-22 ENCOUNTER — Ambulatory Visit (INDEPENDENT_AMBULATORY_CARE_PROVIDER_SITE_OTHER): Payer: BC Managed Care – PPO | Admitting: Pulmonary Disease

## 2012-04-22 ENCOUNTER — Encounter: Payer: Self-pay | Admitting: Pulmonary Disease

## 2012-04-22 VITALS — BP 138/78 | HR 88 | Temp 97.0°F | Ht 62.0 in | Wt 195.8 lb

## 2012-04-22 DIAGNOSIS — I10 Essential (primary) hypertension: Secondary | ICD-10-CM

## 2012-04-22 DIAGNOSIS — N3010 Interstitial cystitis (chronic) without hematuria: Secondary | ICD-10-CM

## 2012-04-22 DIAGNOSIS — M810 Age-related osteoporosis without current pathological fracture: Secondary | ICD-10-CM

## 2012-04-22 DIAGNOSIS — J309 Allergic rhinitis, unspecified: Secondary | ICD-10-CM

## 2012-04-22 DIAGNOSIS — K5730 Diverticulosis of large intestine without perforation or abscess without bleeding: Secondary | ICD-10-CM

## 2012-04-22 DIAGNOSIS — J45909 Unspecified asthma, uncomplicated: Secondary | ICD-10-CM

## 2012-04-22 DIAGNOSIS — M1990 Unspecified osteoarthritis, unspecified site: Secondary | ICD-10-CM

## 2012-04-22 DIAGNOSIS — I779 Disorder of arteries and arterioles, unspecified: Secondary | ICD-10-CM

## 2012-04-22 DIAGNOSIS — K219 Gastro-esophageal reflux disease without esophagitis: Secondary | ICD-10-CM

## 2012-04-22 DIAGNOSIS — I872 Venous insufficiency (chronic) (peripheral): Secondary | ICD-10-CM

## 2012-04-22 DIAGNOSIS — E7800 Pure hypercholesterolemia, unspecified: Secondary | ICD-10-CM

## 2012-04-22 DIAGNOSIS — C439 Malignant melanoma of skin, unspecified: Secondary | ICD-10-CM

## 2012-04-22 MED ORDER — HYDROCODONE-HOMATROPINE 5-1.5 MG/5ML PO SYRP: 5.0000 mL | ORAL_SOLUTION | Freq: Four times a day (QID) | ORAL | Status: AC | PRN

## 2012-04-22 NOTE — Patient Instructions (Addendum)
Today we updated your med list in our EPIC system...    Continue your current medications the same...  We refilled your Hycodan cough syrup (for as needed use) and we removed the Evista (ok to stop)...  Keep up the good work w/ diet & exercise, work on weight reduction...  Call for any problems...  Let's plan a follow up visit in 6 months w/ FASTING blood work at that time.Brianna KitchenMarland Mccarty

## 2012-04-25 ENCOUNTER — Encounter: Payer: Self-pay | Admitting: Pulmonary Disease

## 2012-04-25 NOTE — Progress Notes (Signed)
Subjective:    Patient ID: Brianna Mccarty, female    DOB: 1941-07-06, 71 y.o.   MRN: [id number]  HPI 71 y/o WF here for a follow up visit... she has hx refractory asthmatic bronchitis requiring hosp & IV Solumedrol & max bronchodilator Rx etc... in the outpt setting she gets frequent courses of Prednisone & we have endeavored to maximize her inhaled regimen so as to keep her out of the hosp & off the oral steroids as much as poss... she also sees DrESL for allergy shots, and she has seen DrKozlow who felt that she had a major component of reflux causing her refractory airways dis...  ~  June 11, 2010:  she continues w/ freq exac- "required Pred 4 times since 1/11"...using NEB w/ Brovana & Budes Bid, + Symbicort Bid as before (see below- compliance is suspect)... 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 PFT's today are essent WNL & she is reassured... incr anxiety due to husb's TKR & she's caring for him at home...  ~  September 10, 2010:  Our plan was to maximize her inhaled regimen w/ Symbicort + Nebs using Budesonide & Brovana in an attempt to keep her off of Pred esp since her spirometry was essent normal & symptoms out of proportion... she hadn't required Pred since last OV w/ this regimen, but recently was seen by DrESL to renew her allergy shots and he stopped her nebs & put her on Pred again... I indicated that she had too many chefs in the kitchen & it was OK w/ me for DrLeBauer to take over her asthma management & she will check w/ me Q93mo for medical follow up... she is planning 50th anniv trip to [place] & requests ZPak for Prn use (also got prn Pred from DrLeB)... OK Flu shot today.  ~  Mar 11, 2011:  129mo ROV & states she'd been doing well on her regimen of Symbicort 1spBid, & prn Albuterol Neb; but recently had an exac & DrLeBauer started Pred & she is tapering the 10mg  tabs now...  She notes BP controlled, no  chest pain etc & denies cerebral ischemic symptoms; we reviewed last labs from 9/11; see prob list below>  ~  September 18, 2011:  129mo ROV & she reports "the best yr in recent memory" and she attributes some of the success to a new supplement "nopalea" that she states cuts down inflamm; her last Pred was 5/12 & doing satis on her other meds at present...  BP controlled on meds; denies CP, palpit, ch in SOB, edema, or cerebral ischemic symptoms; she remains on Simva20 + RYR and f/u FLP looks good; she remains on Prev30Bid & feels she still needs the Bid dosing; IC controlled on meds per Urology; See prob list below>> she requests refills today & 2012 Flu vaccine...  ~  April 22, 2012:  29mo ROV & Brianna Mccarty continues to do satis from the pulm standpoint w/o exacerbation;  She had left breast biopsy 1/13 that turned out benign- prob fibroadenoma;  She continues on Nopalea supplement w/ it's anti-inflamm properties;  She saw DrFontaine 2/13> hx lichen sclerosis- uses Temovate cream & doing well, he deferred Osteoporosis testing & follow up to [place] & she is noted to be on both Evista & Alendronate; she wants to stop the Evista due to expense & this is certainly ok w/ me;  She also saw [facility], [facility], [facility] for Cards  f/u 5/13> doing well & no changes made...     We reviewed prob list, meds, xrays and labs> see below>>         Problem List:  ALLERGIC RHINITIS (ICD-477.9) ASTHMA (ICD-493.90) - she takes ALLEGRA 180mg /d, SYMBICORT 80- 1spBid, PROAIR & NEBS w/ Albuterol per DrESL; in addition she still gets weekly shots from [facility], & he treats her w/ freq courses of Pred for exacerbations... ~  CXR 6/09 was clear...  ~  CXR 2/10 hosp showed mild peribronch thickening, NAD... ~  PFT 3/10 showed FVC=2.36 (90%), FEV1=1.78 (85%), %1sec=75, mid-flows= 57%pred. ~  4/10: she saw DrKozlow for second opinion & Rx of her AB & GERD... ~  CXR 2/11 showed clear lungs, NAD... f/u 7/11= same (clear, NAD).Marland Kitchen. ~  RAST testing  5/11 showed IgE= 130, & neg rast tests x cockroach +low titer. ~  CXR 7/11 showed mild cardiomeg, clear lungs, NAD. ~  Sinus CT 8/11 showed no signif inflamm dis, mild membrane thickening, min ethmoid changes on right. ~  PFT 8/11 showed FVC=2.04 (71%), FEV1=1.58 (73%), %1sec=78, mid-flows= 70%pred. ~  11/12:  She reports no recent infectious exac & doing better on a new supplement "nopalea" which she says reduces inflamm.  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 ~  CTChest 2008 w/ 3mm nodule in RUL- unchanged over 75mo interval, small HH, noncalcif pleural plaques bilat... she is reassured w/ the recent CXR & serial films not showing nodule. ~  no recent resp infections but she likes to keep ZPak handy just in case.  HYPERTENSION (ICD-401.9) &  CHEST PAIN-UNSPECIFIED (ICD-786.50) - controlled on 4 meds:  NORVASC 10mg /d, LOSARTAN 100mg /d, MINIPRESS 5mg Bid, & LASIX 40mg /d... ~  2DEcho 8/09 showed norm LV wall motion & EF= 60-65%, mild incr AoV thickness, mild LA dil & atrial septal aneurysm noted. ~  EKG 9/10 by DrKatz- NSR, WNL... ~  2DEcho 9/10 showed norm LV wall motion & thickness, EF= 60-65%, gr I DD noted, sl dil LA, right side OK...  ~  9/11:  BP= 130/84, and BP's at home are even better- 120's/ 60's per pt... denies HA, visual changes, CP, palipit, dizziness, syncope, edema, etc... ~  1/12:  She had f/u appt DrKatz- stable, no changes made... ~  5/12:  BP= 122/74 and stable on meds... ~  11/12:  BP= 126/74 & similar at home she says... ~  5/13:  She saw DrKatz for Cards f/u> EKG showed NSR, rate76, poor R progression, NAD; stable & doing well- no changes made... ~  6/13:  BP= 138/78 & she is feeling well; denies CP, palpit, SOB, edema, etc...  ? 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 she had an abn lifeline screen 3/08 w/ left Carotid in the mild/mod range... ~  CDopplers 4/09 showed mild smooth heterogen plaque in bulbs, 0-39% ICA stenoses... ~   f/u CDoppler 1/12 by Delton See showed mild heterogen plaque in bulbs, 0-39% bilat ICAstenoses... 1cm thyr cyst noted- not palpated.  VENOUS INSUFFICIENCY (ICD-459.81) - she knows to avoid sodium, elevate legs, wear support hose, continue the Lasix...  HYPERCHOLESTEROLEMIA (ICD-272.0) - on SIMVASTATIN 20mg /d + CoQ10 daily... prev intol to statins w/ severe leg cramps but the addition of the CoQ10 has made a world of difference to her tolerability... ~  FLP 3/08 on diet alone showed TChol 268, TG 35, HDL 96, LDL 166... ~  FLP 9/08 on Crestor 5mg /d showed TChol 212, TG 37, HDL 73, LDL 127... continue Rx. ~  FLP 12/09 showed TChol 179, TG 38, HDL 78, LDL 93... continue same Rx. ~  FLP 2/11 on Cres5 showed TChol 161, TG 42, HDL 70, LDL 83... pt switched to Simva20 per request. ~  FLP 9/11 on Simva20 showed TChol 209, TG 42, HDL 72, LDL 127... Same med, better diet, get wt down. ~  FLP 11/12 on Simva20 showed TChol 168, TG 35, HDL 67, LDL 94  GERD (ICD-530.81) - on PREVACID 30mg Bid + max antireflux regimen Qhs. ~  last EGD 6/08 by DrSam showed 4cmHH, gastitis, and stricture- dilated... prev HPylori neg in 2007.  DIVERTICULOSIS OF COLON (ICD-562.10) ~  colonoscopy 3/05 by DrSam showed divertics only... f/u planned 19yrs (+fam hx in uncle). ~  colonoscopy 9/10 by DrStark showed divertics only... he plans f/u 45yrs.  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 she has had DMSO in bladder + taking> ELMIRON 100mg Tid, & DDAVP 0.2mg -3tabsHS (off prev Vesicare)... last DMSO treatment ~5/11 per pt. ~  Note:  She is followed by DrFontaine for GYN...  RENAL CALCULUS, HX OF (ICD-V13.01) - required cysto & basket stone removal from right ureter in 2007...  DEGENERATIVE JOINT DISEASE (ICD-715.90) - she is s/p left THR by DrGioffre in 2004...  OSTEOPOROSIS (ICD-733.00) - on ALENDRONATE 70mg /wk started 1/12; + Calcium, MVI, VitD; prev on Evista & this was stopped 6/13... ~  labs 12/09 showed  Vit D level = 34... advised to start Vit D OTC 1-2,000 u daily... ~  BMD 1/12 showed TScores -0.1 in Spine, and -2.3 in [facility]; we recommended starting FOSAMAX 70mg /wk along w/ her Calcium, MVI, VitD 5000u (esp in light of her freq Pred use). ~  6/13:  Pt wanted to know if it was ok to stop her Evista since it is so expensive; she saw DrFontaine & he deferred to [place]- ok to stop Evista.  HEADACHE (ICD-784.0) - prev eval at the Headache clinic- 1994 by DrSpillman, and 2003 by DrFreeman...  Hx of MALIGNANT MELANOMA (ICD-172.9) - removed by [facility] in 2006 from medial left knee area...  Health Maintenance - she gets the seasonal Flu vaccine yearly... given f/u PNEUMOVAX at age 69 in 2009...   Past Surgical History  Procedure Date  . Left total hip replacement 2004    Dr. Darrelyn Hillock  . Melanoma removed from medial rleft knee area 2006    Dr. Margo Aye  . Cystoscopy and basket stone removal right ureter 02/2006    Dr. Vonita Moss  . Cardiac catheterization   . Breast surgery 2013    Breast Bx-Benign  . Nasal septum surgery     Outpatient Encounter Prescriptions as of 04/22/2012  Medication Sig Dispense Refill  . albuterol (PROAIR HFA) 108 (90 BASE) MCG/ACT inhaler Inhale 2 puffs into the lungs every 6 (six) hours as needed for wheezing.  3 Inhaler  3  . albuterol (PROVENTIL) (2.5 MG/3ML) 0.083% nebulizer solution Take 3 mLs (2.5 mg total) by nebulization every 6 (six) hours as needed.  360 mL  3  . alendronate (FOSAMAX) 70 MG tablet Take 1 tablet (70 mg total) by mouth every 7 (seven) days. Take with a full glass of water on an empty stomach.  12 tablet  3  . amLODipine (NORVASC) 10 MG tablet Take 1 tablet (10 mg total) by mouth daily.  90 tablet  3  . Ascorbic Acid (VITAMIN C) 500 MG tablet Take 500 mg by mouth daily.        Marland Kitchen aspirin 81 MG tablet Take 81 mg  by mouth daily.        . budesonide-formoterol (SYMBICORT) 80-4.5 MCG/ACT inhaler Inhale 1 puff into the lungs daily. Per Dr. Corinda Gubler      .  Calcium Carbonate (CALCIUM 600) 1500 MG TABS Take 1 tablet by mouth daily.        . Cholecalciferol (VITAMIN D3) 5000 UNITS CAPS Take 1 capsule by mouth daily.        . Coenzyme Q10 (COQ10) 100 MG CAPS Take 1 capsule by mouth daily.        Marland Kitchen desmopressin (DDAVP) 0.2 MG tablet Take 3 tablets by mouth daily at bedtime       . fexofenadine (ALLEGRA) 180 MG tablet Take 180 mg by mouth daily.        . furosemide (LASIX) 40 MG tablet Take 1 tablet (40 mg total) by mouth daily.  90 tablet  3  . HYDROcodone-acetaminophen (NORCO) 5-325 MG per tablet Take 1 tablet by mouth every 6 (six) hours as needed.      . lansoprazole (PREVACID) 30 MG capsule Take 1 capsule (30 mg total) by mouth 2 (two) times daily.  180 capsule  3  . losartan (COZAAR) 100 MG tablet Take 1 tablet (100 mg total) by mouth daily.  90 tablet  3  . magnesium oxide (MAG-OX) 400 MG tablet Take 400 mg by mouth daily.        . Meth-Hyo-M Bl-Na Phos-Ph Sal (URIBEL) 118 MG CAPS Take 1 capsule by mouth as needed.      . Multiple Vitamin (MULTIVITAMIN) capsule Take 1 capsule by mouth daily.        . pentosan polysulfate (ELMIRON) 100 MG capsule Take 100 mg by mouth 3 (three) times daily before meals.        . prazosin (MINIPRESS) 5 MG capsule Take 1 capsule (5 mg total) by mouth 2 (two) times daily.  180 capsule  3  . Probiotic Product (PROBIOTIC PO) 1 daily      . simvastatin (ZOCOR) 20 MG tablet Take 1 tablet (20 mg total) by mouth at bedtime.  90 tablet  3  . HYDROcodone-homatropine (HYCODAN) 5-1.5 MG/5ML syrup Take 5 mLs by mouth every 6 (six) hours as needed for cough.  240 mL  1  . DISCONTD: EVISTA 60 MG tablet TAKE 1 TABLET (60 MG TOTAL) BY MOUTH DAILY.  30 tablet  5    Allergies  Allergen Reactions  . Cephalexin     REACTION: high fever  . Penicillins     REACTION: severe swelling and hives  . Phenobarbital     REACTION: hives    Review of Systems         See HPI - all other systems neg except as noted... The patient  complains of dyspnea on exertion.  The patient denies anorexia, fever, weight loss, weight gain, vision loss, decreased hearing, hoarseness, chest pain, syncope, peripheral edema, prolonged cough, headaches, hemoptysis, abdominal pain, melena, hematochezia, severe indigestion/heartburn, hematuria, incontinence, muscle weakness, suspicious skin lesions, transient blindness, difficulty walking, depression, unusual weight change, abnormal bleeding, enlarged lymph nodes, and angioedema.     Objective:   Physical Exam      WD, Overweight, 71 y/o WF in NAD... Vital Signs:  Reviewed... GENERAL:  Alert & oriented; pleasant & cooperative... HEENT:  Benedict/AT, EOM-wnl, PERRLA, EACs-clear, TMs- some wax, NOSE-clear, THROAT- resolved, clear now. NECK:  Supple w/ fairROM; no JVD; normal carotid impulses w/o bruits; no thyromegaly or nodules palpated; no lymphadenopathy. CHEST:  essentially clear  now- without wheezing, rales, or signs of consolidation... min forced end-exp wheeze. HEART:  Regular Rhythm; without murmurs/ rubs/ or gallops heard... ABDOMEN:  Obese, soft & nontender; normal bowel sounds; no organomegaly or masses palpated... EXT: without deformities, mild arthritic changes; no varicose veins/ +venous insuffic/ tr edema. NEURO:  CN's intact;  no focal neuro deficits... DERM:  No lesions noted; no rash etc...  RADIOLOGY DATA:  Reviewed in the EPIC EMR & discussed w/ the patient...  LABORATORY DATA:  Reviewed in the EPIC EMR & discussed w/ the patient...   Assessment & Plan:   ASTHMA>  On regimen as directed by DrESL, she indicates doing well & hasn't needed Pred since 5/12 due to "Nopalea" supplement.Marland Kitchen  HBP>  Controlled on meds, continue same, get wt down...  ?of Cerebrovasc dis> on ASA w/o cerebral ischemic symptoms & CDoppler 1/12 w/o acute change or progressive plaque etc...  CHOL> on Simva20, has added RYR she says, & FLP looks good...  GI>  Hx GERD stable on PPI Bid...  IC>   Followed by DrPeterson & stable by her report...  DJD/ OSteopenia>  Now on Fosamax along w/ her Calcium, MVI, Vit D...  Other medical problems as noted...   Patient's Medications  New Prescriptions   HYDROCODONE-HOMATROPINE (HYCODAN) 5-1.5 MG/5ML SYRUP    Take 5 mLs by mouth every 6 (six) hours as needed for cough.  Previous Medications   ALBUTEROL (PROAIR HFA) 108 (90 BASE) MCG/ACT INHALER    Inhale 2 puffs into the lungs every 6 (six) hours as needed for wheezing.   ALBUTEROL (PROVENTIL) (2.5 MG/3ML) 0.083% NEBULIZER SOLUTION    Take 3 mLs (2.5 mg total) by nebulization every 6 (six) hours as needed.   ALENDRONATE (FOSAMAX) 70 MG TABLET    Take 1 tablet (70 mg total) by mouth every 7 (seven) days. Take with a full glass of water on an empty stomach.   AMLODIPINE (NORVASC) 10 MG TABLET    Take 1 tablet (10 mg total) by mouth daily.   ASCORBIC ACID (VITAMIN C) 500 MG TABLET    Take 500 mg by mouth daily.     ASPIRIN 81 MG TABLET    Take 81 mg by mouth daily.     BUDESONIDE-FORMOTEROL (SYMBICORT) 80-4.5 MCG/ACT INHALER    Inhale 1 puff into the lungs daily. Per Dr. Corinda Gubler   CALCIUM CARBONATE (CALCIUM 600) 1500 MG TABS    Take 1 tablet by mouth daily.     CHOLECALCIFEROL (VITAMIN D3) 5000 UNITS CAPS    Take 1 capsule by mouth daily.     COENZYME Q10 (COQ10) 100 MG CAPS    Take 1 capsule by mouth daily.     DESMOPRESSIN (DDAVP) 0.2 MG TABLET    Take 3 tablets by mouth daily at bedtime    FEXOFENADINE (ALLEGRA) 180 MG TABLET    Take 180 mg by mouth daily.     FUROSEMIDE (LASIX) 40 MG TABLET    Take 1 tablet (40 mg total) by mouth daily.   HYDROCODONE-ACETAMINOPHEN (NORCO) 5-325 MG PER TABLET    Take 1 tablet by mouth every 6 (six) hours as needed.   LANSOPRAZOLE (PREVACID) 30 MG CAPSULE    Take 1 capsule (30 mg total) by mouth 2 (two) times daily.   LOSARTAN (COZAAR) 100 MG TABLET    Take 1 tablet (100 mg total) by mouth daily.   MAGNESIUM OXIDE (MAG-OX) 400 MG TABLET    Take 400 mg by mouth  daily.  METH-HYO-M BL-NA PHOS-PH SAL (URIBEL) 118 MG CAPS    Take 1 capsule by mouth as needed.   MULTIPLE VITAMIN (MULTIVITAMIN) CAPSULE    Take 1 capsule by mouth daily.     PENTOSAN POLYSULFATE (ELMIRON) 100 MG CAPSULE    Take 100 mg by mouth 3 (three) times daily before meals.     PRAZOSIN (MINIPRESS) 5 MG CAPSULE    Take 1 capsule (5 mg total) by mouth 2 (two) times daily.   PROBIOTIC PRODUCT (PROBIOTIC PO)    1 daily   SIMVASTATIN (ZOCOR) 20 MG TABLET    Take 1 tablet (20 mg total) by mouth at bedtime.  Modified Medications   No medications on file  Discontinued Medications   EVISTA 60 MG TABLET    TAKE 1 TABLET (60 MG TOTAL) BY MOUTH DAILY.

## 2012-05-08 ENCOUNTER — Other Ambulatory Visit: Payer: Self-pay | Admitting: *Deleted

## 2012-05-08 DIAGNOSIS — D249 Benign neoplasm of unspecified breast: Secondary | ICD-10-CM

## 2012-06-09 ENCOUNTER — Other Ambulatory Visit: Payer: Self-pay | Admitting: Gynecology

## 2012-06-09 DIAGNOSIS — D249 Benign neoplasm of unspecified breast: Secondary | ICD-10-CM

## 2012-08-11 ENCOUNTER — Ambulatory Visit (INDEPENDENT_AMBULATORY_CARE_PROVIDER_SITE_OTHER): Payer: BC Managed Care – PPO

## 2012-08-11 DIAGNOSIS — Z23 Encounter for immunization: Secondary | ICD-10-CM

## 2012-09-09 ENCOUNTER — Other Ambulatory Visit: Payer: Self-pay | Admitting: *Deleted

## 2012-09-09 MED ORDER — LANSOPRAZOLE 30 MG PO CPDR: 30.0000 mg | DELAYED_RELEASE_CAPSULE | Freq: Two times a day (BID) | ORAL | Status: DC

## 2012-09-23 ENCOUNTER — Other Ambulatory Visit: Payer: Self-pay | Admitting: *Deleted

## 2012-09-23 MED ORDER — FUROSEMIDE 40 MG PO TABS: 40.0000 mg | ORAL_TABLET | Freq: Every day | ORAL | Status: DC

## 2012-10-20 ENCOUNTER — Encounter: Payer: Self-pay | Admitting: *Deleted

## 2012-10-21 ENCOUNTER — Encounter: Payer: Self-pay | Admitting: Pulmonary Disease

## 2012-10-21 ENCOUNTER — Ambulatory Visit (INDEPENDENT_AMBULATORY_CARE_PROVIDER_SITE_OTHER): Payer: BC Managed Care – PPO | Admitting: Pulmonary Disease

## 2012-10-21 VITALS — BP 144/80 | HR 70 | Temp 97.3°F | Ht 62.0 in | Wt 197.8 lb

## 2012-10-21 DIAGNOSIS — J42 Unspecified chronic bronchitis: Secondary | ICD-10-CM

## 2012-10-21 DIAGNOSIS — M1990 Unspecified osteoarthritis, unspecified site: Secondary | ICD-10-CM

## 2012-10-21 DIAGNOSIS — N3010 Interstitial cystitis (chronic) without hematuria: Secondary | ICD-10-CM

## 2012-10-21 DIAGNOSIS — F419 Anxiety disorder, unspecified: Secondary | ICD-10-CM

## 2012-10-21 DIAGNOSIS — C439 Malignant melanoma of skin, unspecified: Secondary | ICD-10-CM

## 2012-10-21 DIAGNOSIS — K5730 Diverticulosis of large intestine without perforation or abscess without bleeding: Secondary | ICD-10-CM

## 2012-10-21 DIAGNOSIS — I10 Essential (primary) hypertension: Secondary | ICD-10-CM

## 2012-10-21 DIAGNOSIS — J45909 Unspecified asthma, uncomplicated: Secondary | ICD-10-CM

## 2012-10-21 DIAGNOSIS — R51 Headache: Secondary | ICD-10-CM

## 2012-10-21 DIAGNOSIS — M810 Age-related osteoporosis without current pathological fracture: Secondary | ICD-10-CM

## 2012-10-21 DIAGNOSIS — K219 Gastro-esophageal reflux disease without esophagitis: Secondary | ICD-10-CM

## 2012-10-21 DIAGNOSIS — I872 Venous insufficiency (chronic) (peripheral): Secondary | ICD-10-CM

## 2012-10-21 DIAGNOSIS — E7800 Pure hypercholesterolemia, unspecified: Secondary | ICD-10-CM

## 2012-10-21 DIAGNOSIS — I779 Disorder of arteries and arterioles, unspecified: Secondary | ICD-10-CM

## 2012-10-21 DIAGNOSIS — I059 Rheumatic mitral valve disease, unspecified: Secondary | ICD-10-CM

## 2012-10-21 MED ORDER — AMLODIPINE BESYLATE 10 MG PO TABS: 10.0000 mg | ORAL_TABLET | Freq: Every day | ORAL | Status: DC

## 2012-10-21 MED ORDER — LOSARTAN POTASSIUM 100 MG PO TABS: 100.0000 mg | ORAL_TABLET | Freq: Every day | ORAL | Status: DC

## 2012-10-21 MED ORDER — ALBUTEROL SULFATE HFA 108 (90 BASE) MCG/ACT IN AERS: 2.0000 | INHALATION_SPRAY | Freq: Four times a day (QID) | RESPIRATORY_TRACT | Status: DC | PRN

## 2012-10-21 MED ORDER — PRAZOSIN HCL 5 MG PO CAPS: 5.0000 mg | ORAL_CAPSULE | Freq: Two times a day (BID) | ORAL | Status: DC

## 2012-10-21 MED ORDER — LANSOPRAZOLE 30 MG PO CPDR: 30.0000 mg | DELAYED_RELEASE_CAPSULE | Freq: Two times a day (BID) | ORAL | Status: DC

## 2012-10-21 MED ORDER — ALENDRONATE SODIUM 70 MG PO TABS: 70.0000 mg | ORAL_TABLET | ORAL | Status: DC

## 2012-10-21 NOTE — Progress Notes (Signed)
Subjective:    Patient ID: Brianna Mccarty, female    DOB: 1941-09-27, 71 y.o.   MRN: [id number]  HPI 71 y/o WF here for a follow up visit... she has hx refractory asthmatic bronchitis requiring hosp & IV Solumedrol & max bronchodilator Rx etc... in the outpt setting she gets frequent courses of Prednisone & we have endeavored to maximize her inhaled regimen so as to keep her out of the hosp & off the oral steroids as much as poss... she also sees DrESL/VanWinkle for allergy shots, and she has seen DrKozlow in the past who felt that she had a major component of reflux causing her refractory airways dis...  SEE PREV EPIC NOTES FOR EARLIER DATA >>  ~  September 10, 2010:  Our plan was to maximize her inhaled regimen w/ Symbicort + Nebs using Budesonide & Brovana in an attempt to keep her off of Pred esp since her spirometry was essent normal & symptoms out of proportion... she hadn't required Pred since last OV w/ this regimen, but recently was seen by DrESL to renew her allergy shots and he stopped her nebs & put her on Pred again... I indicated that she had too many chefs in the kitchen & it was OK w/ me for DrLeBauer to take over her asthma management & she will check w/ me Q4mo for medical follow up... she is planning 50th anniv trip to [place] & requests ZPak for Prn use (also got prn Pred from DrLeB)... OK Flu shot today.  ~  Mar 11, 2011:  51mo ROV & states she'd been doing well on her regimen of Symbicort 1spBid, & prn Albuterol Neb; but recently had an exac & DrLeBauer started Pred & she is tapering the 10mg  tabs now...  She notes BP controlled, no chest pain etc & denies cerebral ischemic symptoms; we reviewed last labs from 9/11; see prob list below>  ~  September 18, 2011:  51mo ROV & she reports "the best yr in recent memory" and she attributes some of the success to a new supplement "nopalea" that she states cuts down inflamm; her last Pred was 5/12 & doing satis on her other meds at present...  BP  controlled on meds; denies CP, palpit, ch in SOB, edema, or cerebral ischemic symptoms; she remains on Simva20 + RYR and f/u FLP looks good; she remains on Prev30Bid & feels she still needs the Bid dosing; IC controlled on meds per Urology; See prob list below>> she requests refills today & 2012 Flu vaccine...  ~  April 22, 2012:  33mo ROV & Brianna Mccarty continues to do satis from the pulm standpoint w/o exacerbation;  She had left breast biopsy 1/13 that turned out benign- prob fibroadenoma;  She continues on Nopalea supplement w/ it's anti-inflamm properties;  She saw DrFontaine 2/13> hx lichen sclerosis- uses Temovate cream & doing well, he deferred Osteoporosis testing & follow up to [place] & she is noted to be on both Evista & Alendronate; she wants to stop the Evista due to expense & this is ok w/ me;  She also saw DrKatz for Cards f/u 5/13> doing well & no changes made...     We reviewed prob list, meds, xrays and labs> see below>>  ~  October 21, 2012:  51mo ROV & Brianna Mccarty notes doing well w/o recent issues, no new complaints or concerns... We reviewed the following medical problems during today's office visit >> reminded to bring all meds to every office visit.Marland KitchenMarland Kitchen  AR, Asthma, Bronchitis> on NEBS w/ Albut, Symbicort80, Allegra180; she has last Asthma exac 8/13- treated by DrSharma w/ Depo/ Pred/ ZPak/ & incr Symbicort to 160; she improved & ret to baseline but didn't bring meds to this OV... HBP, HxCP> on ASA81, Amlod10, Losar100, Minipress5, Lasix40;  BP=144/80 & she denies CP, palpit, SOB, edema; followed by [facility]- seen 5/13 for f/u HBP, CP, SOB; doing satis & no changes made... Cerebrovasc dis> on ASA81; she had f/u CDopplers 1/12 showing stable mild heterogeneous plaque in bulbs & 0-39% ICA stenoses (incidental 1cm right thyroid cyst). VenInsuffic> on low sodium, elevation, support hose... Chol> on Simva20; FLP shows TChol 152, TG 34, HDL 64, LDL 81 GI- HH, GERD, Divertics> on Prev30bid & probiotic  daily; she denies abd pain, dysphagia, n/v, c/d, blood seen; known 4cmHH, last colon 9/10 by DrStark w/ divertics only, but there is a FamHx colon ca in uncle, therefore f/u 31yrs... GU- IC, KidStones> on DDAVP & Elmiron100Tid; she is s/p DMSO treatments; states voiding is fine, denies recent leakage, incont, etc; last stone was 2007... DJD, Osteopenia> on Norco5, Alendronate70/wk, calcium, MVI, VitD; she is s/p leftTHR by DrGioffre2004; last BMD 1/12 showed TScore -2.3 in [facility] HAs> she is s/p eval in HA clinics 1994 & 2003... HxMelanoma> removed from medial left knee in 2006 by [facility]...  We reviewed prob list, meds, xrays and labs> see below for updates >> she had the 2013 Flu vaccine 10/13; she has requested refill prescriptions today... LABS 12/13:  FLP- at goals on Simva20;  Chems- wnl;  CBC- wnl;  TSH=2.43;  VitD=41          Problem List:  ALLERGIC RHINITIS (ICD-477.9) ASTHMA (ICD-493.90) - she takes ALLEGRA 180mg /d, SYMBICORT 80- 1spBid, PROAIR & NEBS w/ Albuterol per DrESL; in addition she still gets weekly shots from [facility] clinic, & he treats her w/ freq courses of Pred for exacerbations... ~  CXR 6/09 was clear...  ~  CXR 2/10 hosp showed mild peribronch thickening, NAD... ~  PFT 3/10 showed FVC=2.36 (90%), FEV1=1.78 (85%), %1sec=75, mid-flows= 57%pred. ~  4/10: she saw DrKozlow for second opinion & Rx of her AB & GERD... ~  CXR 2/11 showed clear lungs, NAD... f/u 7/11= same (clear, NAD).Marland Kitchen. ~  RAST testing 5/11 showed IgE= 130, & neg rast tests x cockroach +low titer. ~  CXR 7/11 showed mild cardiomeg, clear lungs, NAD. ~  Sinus CT 8/11 showed no signif inflamm dis, mild membrane thickening, min ethmoid changes on right. ~  PFT 8/11 showed FVC=2.04 (71%), FEV1=1.58 (73%), %1sec=78, mid-flows= 70%pred. ~  11/12:  She reports no recent infectious exac & doing better on a new supplement "nopalea" which she says reduces inflamm. ~  12/13: n NEBS w/ Albut,  Symbicort80, Allegra180; she has last Asthma exac 8/13- treated by DrSharma w/ Depo/ Pred/ ZPak/ & incr Symbicort to 160; she improved & ret to baseline but didn't bring meds to this OV...  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 ~  CTChest 2008 w/ 3mm nodule in RUL- unchanged over 19mo interval, small HH, noncalcif pleural plaques bilat... she is reassured w/ the recent CXR & serial films not showing nodule. ~  no recent resp infections but she likes to keep ZPak handy just in case.  HYPERTENSION (ICD-401.9) &  CHEST PAIN-UNSPECIFIED (ICD-786.50) - controlled on 4 meds:  NORVASC 10mg /d, LOSARTAN 100mg /d, MINIPRESS 5mg Bid, & LASIX 40mg /d... ~  2DEcho 8/09 showed norm LV wall motion & EF= 60-65%, mild incr AoV thickness,  mild LA dil & atrial septal aneurysm noted. ~  EKG 9/10 by DrKatz- NSR, WNL... ~  2DEcho 9/10 showed norm LV wall motion & thickness, EF= 60-65%, gr I DD noted, sl dil LA, right side OK...  ~  9/11:  BP= 130/84, and BP's at home are even better- 120's/ 60's per pt... denies HA, visual changes, CP, palipit, dizziness, syncope, edema, etc... ~  1/12:  She had f/u appt DrKatz- stable, no changes made... ~  5/12:  BP= 122/74 and stable on meds... ~  11/12:  BP= 126/74 & similar at home she says... ~  5/13:  She saw DrKatz for Cards f/u> EKG showed NSR, rate76, poor R progression, NAD; stable & doing well- no changes made... ~  6/13:  BP= 138/78 & she is feeling well; denies CP, palpit, SOB, edema, etc... ~  12/13:  on ASA81, Amlod10, Losar100, Minipress5, Lasix40;  BP=144/80 & she denies CP, palpit, SOB, edema; followed by [facility]- seen 5/13 for f/u HBP, CP, SOB; doing satis & no changes made...  CEREBROVASCULAR DISEASE (ICD-437.9) - on ASA 81mg /d... she had an abn lifeline screen 3/08 w/ left Carotid in the mild/mod range... ~  CDopplers 4/09 showed mild smooth heterogen plaque in bulbs, 0-39% ICA stenoses... ~  f/u CDoppler 1/12 by Delton See showed mild heterogen  plaque in bulbs, 0-39% bilat ICAstenoses... 1cm thyr cyst noted- not palpated.  VENOUS INSUFFICIENCY (ICD-459.81) - she knows to avoid sodium, elevate legs, wear support hose, continue the Lasix...  HYPERCHOLESTEROLEMIA (ICD-272.0) - on SIMVASTATIN 20mg /d + CoQ10 daily... prev intol to statins w/ severe leg cramps but the addition of the CoQ10 has made a world of difference to her tolerability... ~  FLP 3/08 on diet alone showed TChol 268, TG 35, HDL 96, LDL 166... ~  FLP 9/08 on Crestor 5mg /d showed TChol 212, TG 37, HDL 73, LDL 127... continue Rx. ~  FLP 12/09 showed TChol 179, TG 38, HDL 78, LDL 93... continue same Rx. ~  FLP 2/11 on Cres5 showed TChol 161, TG 42, HDL 70, LDL 83... pt switched to Simva20 per request. ~  FLP 9/11 on Simva20 showed TChol 209, TG 42, HDL 72, LDL 127... Same med, better diet, get wt down. ~  FLP 11/12 on Simva20 showed TChol 168, TG 35, HDL 67, LDL 94 ~  FLP 12/13 on simva20 showed TChol 152, TG 34, HDL 64, LDL 81   GERD (ICD-530.81) - on PREVACID 30mg Bid + max antireflux regimen Qhs. ~  last EGD 6/08 by DrSam showed 4cmHH, gastitis, and stricture- dilated... prev HPylori neg in 2007.  DIVERTICULOSIS OF COLON (ICD-562.10) ~  colonoscopy 3/05 by DrSam showed divertics only... f/u planned 48yrs (+fam hx in uncle). ~  colonoscopy 9/10 by DrStark showed divertics only... he plans f/u 5yrs.  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 she has had DMSO in bladder + taking> ELMIRON 100mg Tid, & DDAVP 0.2mg -3tabsHS (off prev Vesicare)... last DMSO treatment ~5/11 per pt. ~  Note:  She is followed by DrFontaine for GYN...  RENAL CALCULUS, HX OF (ICD-V13.01) - required cysto & basket stone removal from right ureter in 2007...  DEGENERATIVE JOINT DISEASE (ICD-715.90) - she is s/p left THR by DrGioffre in 2004...  OSTEOPOROSIS (ICD-733.00) - on ALENDRONATE 70mg /wk started 1/12; + Calcium, MVI, VitD; prev on Evista & this was stopped 6/13... ~  labs  12/09 showed Vit D level = 34... advised to start Vit D OTC 1-2,000 u daily... ~  BMD 1/12 showed TScores -  0.1 in [facility], and -2.3 in [facility]; we recommended starting FOSAMAX 70mg /wk along w/ her Calcium, MVI, VitD 5000u (esp in light of her freq Pred use). ~  6/13:  Pt wanted to know if it was ok to stop her Evista since it is so expensive; she saw DrFontaine & he deferred to [place]- ok to stop Evista.  HEADACHE (ICD-784.0) - prev eval at the Headache clinic- 1994 by DrSpillman, and 2003 by DrFreeman...  Hx of MALIGNANT MELANOMA (ICD-172.9) - removed by [facility] in 2006 from medial left knee area...  Health Maintenance - she gets the seasonal Flu vaccine yearly... given f/u PNEUMOVAX at age 70 in 2009...   Past Surgical History  Procedure Date  . Left total hip replacement 2004    Dr. Darrelyn Hillock  . Melanoma removed from medial rleft knee area 2006    Dr. Margo Aye  . Cystoscopy and basket stone removal right ureter 02/2006    Dr. Vonita Moss  . Cardiac catheterization   . Breast surgery 2013    Breast Bx-Benign  . Nasal septum surgery     Outpatient Encounter Prescriptions as of 10/21/2012  Medication Sig Dispense Refill  . albuterol (PROAIR HFA) 108 (90 BASE) MCG/ACT inhaler Inhale 2 puffs into the lungs every 6 (six) hours as needed for wheezing.  3 Inhaler  3  . albuterol (PROVENTIL) (2.5 MG/3ML) 0.083% nebulizer solution Take 3 mLs (2.5 mg total) by nebulization every 6 (six) hours as needed.  360 mL  3  . alendronate (FOSAMAX) 70 MG tablet Take 1 tablet (70 mg total) by mouth every 7 (seven) days. Take with a full glass of water on an empty stomach.  12 tablet  3  . amLODipine (NORVASC) 10 MG tablet Take 1 tablet (10 mg total) by mouth daily.  90 tablet  3  . Ascorbic Acid (VITAMIN C) 500 MG tablet Take 500 mg by mouth daily.        Marland Kitchen aspirin 81 MG tablet Take 81 mg by mouth daily.        . budesonide-formoterol (SYMBICORT) 80-4.5 MCG/ACT inhaler Inhale 1 puff into the lungs daily. Per Dr.  Corinda Gubler      . Calcium Carbonate (CALCIUM 600) 1500 MG TABS Take 1 tablet by mouth daily.        . Cholecalciferol (VITAMIN D3) 5000 UNITS CAPS Take 1 capsule by mouth daily.        . Coenzyme Q10 (COQ10) 100 MG CAPS Take 1 capsule by mouth daily.        Marland Kitchen desmopressin (DDAVP) 0.2 MG tablet Take 3 tablets by mouth daily at bedtime       . fexofenadine (ALLEGRA) 180 MG tablet Take 180 mg by mouth daily.        . furosemide (LASIX) 40 MG tablet Take 1 tablet (40 mg total) by mouth daily.  90 tablet  1  . HYDROcodone-acetaminophen (NORCO) 5-325 MG per tablet Take 1 tablet by mouth every 6 (six) hours as needed.      . lansoprazole (PREVACID) 30 MG capsule Take 1 capsule (30 mg total) by mouth 2 (two) times daily.  180 capsule  1  . losartan (COZAAR) 100 MG tablet Take 1 tablet (100 mg total) by mouth daily.  90 tablet  3  . magnesium oxide (MAG-OX) 400 MG tablet Take 400 mg by mouth daily.        . Multiple Vitamin (MULTIVITAMIN) capsule Take 1 capsule by mouth daily.        Marland Kitchen  pentosan polysulfate (ELMIRON) 100 MG capsule Take 100 mg by mouth 3 (three) times daily before meals.        . prazosin (MINIPRESS) 5 MG capsule Take 1 capsule (5 mg total) by mouth 2 (two) times daily.  180 capsule  3  . Probiotic Product (PROBIOTIC PO) 1 daily      . simvastatin (ZOCOR) 20 MG tablet Take 1 tablet (20 mg total) by mouth at bedtime.  90 tablet  3  . [DISCONTINUED] Meth-Hyo-M Bl-Na Phos-Ph Sal (URIBEL) 118 MG CAPS Take 1 capsule by mouth as needed.        Allergies  Allergen Reactions  . Cephalexin     REACTION: high fever  . Penicillins     REACTION: severe swelling and hives  . Phenobarbital     REACTION: hives    Review of Systems         See HPI - all other systems neg except as noted... The patient complains of dyspnea on exertion.  The patient denies anorexia, fever, weight loss, weight gain, vision loss, decreased hearing, hoarseness, chest pain, syncope, peripheral edema, prolonged cough,  headaches, hemoptysis, abdominal pain, melena, hematochezia, severe indigestion/heartburn, hematuria, incontinence, muscle weakness, suspicious skin lesions, transient blindness, difficulty walking, depression, unusual weight change, abnormal bleeding, enlarged lymph nodes, and angioedema.     Objective:   Physical Exam      WD, Overweight, 71 y/o WF in NAD... Vital Signs:  Reviewed... GENERAL:  Alert & oriented; pleasant & cooperative... HEENT:  [place]/AT, EOM-wnl, PERRLA, EACs-clear, TMs- some wax, NOSE-clear, THROAT- resolved, clear now. NECK:  Supple w/ fairROM; no JVD; normal carotid impulses w/o bruits; no thyromegaly or nodules palpated; no lymphadenopathy. CHEST:  essentially clear now- without wheezing, rales, or signs of consolidation... min forced end-exp wheeze. HEART:  Regular Rhythm; without murmurs/ rubs/ or gallops heard... ABDOMEN:  Obese, soft & nontender; normal bowel sounds; no organomegaly or masses palpated... EXT: without deformities, mild arthritic changes; no varicose veins/ +venous insuffic/ tr edema. NEURO:  CN's intact;  no focal neuro deficits... DERM:  No lesions noted; no rash etc...  RADIOLOGY DATA:  Reviewed in the EPIC EMR & discussed w/ the patient...  LABORATORY DATA:  Reviewed in the EPIC EMR & discussed w/ the patient...   Assessment & Plan:    ASTHMA>  On regimen as above, she indicates doing well & only needed Pred once since 5/12 due to "Nopalea" supplement.Marland Kitchen  HBP>  Controlled on meds, continue same, get wt down...  Cerebrovasc dis> on ASA w/o cerebral ischemic symptoms & CDoppler 1/12 w/o acute change or progressive plaque etc...  CHOL> on Simva20, has added RYR she says, & FLP looks good...  GI>  Hx GERD stable on PPI Bid...  IC>  Followed by DrPeterson & stable by her report...  DJD/ OSteopenia>  Now on Fosamax along w/ her Calcium, MVI, Vit D...  Other medical problems as noted...   Patient's Medications  New Prescriptions   No  medications on file  Previous Medications   ALBUTEROL (PROVENTIL) (2.5 MG/3ML) 0.083% NEBULIZER SOLUTION    Take 3 mLs (2.5 mg total) by nebulization every 6 (six) hours as needed.   ASCORBIC ACID (VITAMIN C) 500 MG TABLET    Take 500 mg by mouth daily.     ASPIRIN 81 MG TABLET    Take 81 mg by mouth daily.     BUDESONIDE-FORMOTEROL (SYMBICORT) 80-4.5 MCG/ACT INHALER    Inhale 1 puff into the lungs daily. Per Dr.  Castlewood   CALCIUM CARBONATE (CALCIUM 600) 1500 MG TABS    Take 1 tablet by mouth daily.     CHOLECALCIFEROL (VITAMIN D3) 5000 UNITS CAPS    Take 1 capsule by mouth daily.     COENZYME Q10 (COQ10) 100 MG CAPS    Take 1 capsule by mouth daily.     DESMOPRESSIN (DDAVP) 0.2 MG TABLET    Take 3 tablets by mouth daily at bedtime    FEXOFENADINE (ALLEGRA) 180 MG TABLET    Take 180 mg by mouth daily.     FUROSEMIDE (LASIX) 40 MG TABLET    Take 1 tablet (40 mg total) by mouth daily.   HYDROCODONE-ACETAMINOPHEN (NORCO) 5-325 MG PER TABLET    Take 1 tablet by mouth every 6 (six) hours as needed.   MAGNESIUM OXIDE (MAG-OX) 400 MG TABLET    Take 400 mg by mouth daily.     MULTIPLE VITAMIN (MULTIVITAMIN) CAPSULE    Take 1 capsule by mouth daily.     PENTOSAN POLYSULFATE (ELMIRON) 100 MG CAPSULE    Take 100 mg by mouth 3 (three) times daily before meals.     PROBIOTIC PRODUCT (PROBIOTIC PO)    1 daily   SIMVASTATIN (ZOCOR) 20 MG TABLET    Take 1 tablet (20 mg total) by mouth at bedtime.  Modified Medications   Modified Medication Previous Medication   ALBUTEROL (PROAIR HFA) 108 (90 BASE) MCG/ACT INHALER albuterol (PROAIR HFA) 108 (90 BASE) MCG/ACT inhaler      Inhale 2 puffs into the lungs every 6 (six) hours as needed for wheezing.    Inhale 2 puffs into the lungs every 6 (six) hours as needed for wheezing.   ALENDRONATE (FOSAMAX) 70 MG TABLET alendronate (FOSAMAX) 70 MG tablet      Take 1 tablet (70 mg total) by mouth every 7 (seven) days. Take with a full glass of water on an empty stomach.     Take 1 tablet (70 mg total) by mouth every 7 (seven) days. Take with a full glass of water on an empty stomach.   AMLODIPINE (NORVASC) 10 MG TABLET amLODipine (NORVASC) 10 MG tablet      Take 1 tablet (10 mg total) by mouth daily.    Take 1 tablet (10 mg total) by mouth daily.   LANSOPRAZOLE (PREVACID) 30 MG CAPSULE lansoprazole (PREVACID) 30 MG capsule      Take 1 capsule (30 mg total) by mouth 2 (two) times daily.    Take 1 capsule (30 mg total) by mouth 2 (two) times daily.   LOSARTAN (COZAAR) 100 MG TABLET losartan (COZAAR) 100 MG tablet      Take 1 tablet (100 mg total) by mouth daily.    Take 1 tablet (100 mg total) by mouth daily.   PRAZOSIN (MINIPRESS) 5 MG CAPSULE prazosin (MINIPRESS) 5 MG capsule      Take 1 capsule (5 mg total) by mouth 2 (two) times daily.    Take 1 capsule (5 mg total) by mouth 2 (two) times daily.  Discontinued Medications   METH-HYO-M BL-NA PHOS-PH SAL (URIBEL) 118 MG CAPS    Take 1 capsule by mouth as needed.

## 2012-10-21 NOTE — Patient Instructions (Addendum)
Today we updated your med list in our EPIC system...    Continue your current medications the same...    We refilled your meds per request...  Please return to our lab one morning soon for your FASTING blood work...    We will contact you w/ the results when avail...  Call for any problerms or if we can be of service in any way...  Let's plan a follow up visit recheck in 6 months.Marland KitchenMarland Kitchen

## 2012-10-23 ENCOUNTER — Other Ambulatory Visit (INDEPENDENT_AMBULATORY_CARE_PROVIDER_SITE_OTHER): Payer: BC Managed Care – PPO

## 2012-10-23 DIAGNOSIS — F419 Anxiety disorder, unspecified: Secondary | ICD-10-CM

## 2012-10-23 DIAGNOSIS — F411 Generalized anxiety disorder: Secondary | ICD-10-CM

## 2012-10-23 DIAGNOSIS — E7800 Pure hypercholesterolemia, unspecified: Secondary | ICD-10-CM

## 2012-10-23 DIAGNOSIS — M810 Age-related osteoporosis without current pathological fracture: Secondary | ICD-10-CM

## 2012-10-23 DIAGNOSIS — I10 Essential (primary) hypertension: Secondary | ICD-10-CM

## 2012-10-23 DIAGNOSIS — K5730 Diverticulosis of large intestine without perforation or abscess without bleeding: Secondary | ICD-10-CM

## 2012-10-23 LAB — HEPATIC FUNCTION PANEL
ALT: 28 U/L (ref 0–35)
Alkaline Phosphatase: 51 U/L (ref 39–117)
Bilirubin, Direct: 0.1 mg/dL (ref 0.0–0.3)
Total Bilirubin: 0.5 mg/dL (ref 0.3–1.2)
Total Protein: 6.7 g/dL (ref 6.0–8.3)

## 2012-10-23 LAB — CBC WITH DIFFERENTIAL/PLATELET
Basophils Absolute: 0.1 10*3/uL (ref 0.0–0.1)
Eosinophils Absolute: 0.1 10*3/uL (ref 0.0–0.7)
Hemoglobin: 12.6 g/dL (ref 12.0–15.0)
Lymphocytes Relative: 32.3 % (ref 12.0–46.0)
MCHC: 33.7 g/dL (ref 30.0–36.0)
MCV: 88.3 fl (ref 78.0–100.0)
Monocytes Absolute: 0.5 10*3/uL (ref 0.1–1.0)
Neutro Abs: 3.2 10*3/uL (ref 1.4–7.7)
RDW: 13.5 % (ref 11.5–14.6)

## 2012-10-23 LAB — LIPID PANEL: Total CHOL/HDL Ratio: 2

## 2012-10-23 LAB — BASIC METABOLIC PANEL
CO2: 30 mEq/L (ref 19–32)
Calcium: 9.3 mg/dL (ref 8.4–10.5)
Chloride: 107 mEq/L (ref 96–112)
Sodium: 142 mEq/L (ref 135–145)

## 2012-11-23 ENCOUNTER — Other Ambulatory Visit: Payer: Self-pay | Admitting: *Deleted

## 2012-11-23 DIAGNOSIS — IMO0001 Reserved for inherently not codable concepts without codable children: Secondary | ICD-10-CM

## 2012-11-26 ENCOUNTER — Other Ambulatory Visit: Payer: Self-pay

## 2012-11-26 DIAGNOSIS — IMO0001 Reserved for inherently not codable concepts without codable children: Secondary | ICD-10-CM

## 2012-11-28 ENCOUNTER — Other Ambulatory Visit: Payer: Self-pay | Admitting: Pulmonary Disease

## 2013-02-04 ENCOUNTER — Encounter (INDEPENDENT_AMBULATORY_CARE_PROVIDER_SITE_OTHER): Payer: BC Managed Care – PPO

## 2013-02-04 DIAGNOSIS — I6529 Occlusion and stenosis of unspecified carotid artery: Secondary | ICD-10-CM

## 2013-02-08 NOTE — Progress Notes (Signed)
Quick Note:  0-39% bilateral disease F/U duplex in 2 years ______ 

## 2013-02-16 ENCOUNTER — Encounter: Payer: Self-pay | Admitting: Gynecology

## 2013-03-01 ENCOUNTER — Other Ambulatory Visit: Payer: Self-pay | Admitting: Pulmonary Disease

## 2013-03-01 MED ORDER — SIMVASTATIN 20 MG PO TABS: 20.0000 mg | ORAL_TABLET | Freq: Every day | ORAL | Status: DC

## 2013-03-09 ENCOUNTER — Encounter: Payer: Self-pay | Admitting: Gynecology

## 2013-03-09 ENCOUNTER — Ambulatory Visit (INDEPENDENT_AMBULATORY_CARE_PROVIDER_SITE_OTHER): Payer: BC Managed Care – PPO | Admitting: Gynecology

## 2013-03-09 VITALS — BP 124/80 | Ht 62.0 in | Wt 196.0 lb

## 2013-03-09 DIAGNOSIS — M810 Age-related osteoporosis without current pathological fracture: Secondary | ICD-10-CM

## 2013-03-09 DIAGNOSIS — L940 Localized scleroderma [morphea]: Secondary | ICD-10-CM

## 2013-03-09 DIAGNOSIS — L900 Lichen sclerosus et atrophicus: Secondary | ICD-10-CM

## 2013-03-09 DIAGNOSIS — N952 Postmenopausal atrophic vaginitis: Secondary | ICD-10-CM

## 2013-03-09 NOTE — Addendum Note (Signed)
Addended by: Dara Lords on: 03/09/2013 04:13 PM   Modules accepted: Level of Service

## 2013-03-09 NOTE — Patient Instructions (Signed)
Follow up in one year, sooner as needed. 

## 2013-03-09 NOTE — Progress Notes (Signed)
Brianna Mccarty 1941-03-29 [id number]        72 y.o.  G2P2002 for followup exam.  Several issues noted below.  Past medical history,surgical history, medications, allergies, family history and social history were all reviewed and documented in the EPIC chart. ROS:  Was performed and pertinent positives and negatives are included in the history.  Exam: Kim assistant Filed Vitals:   03/09/13 1405  BP: 124/80  Height: 5\' 2"  (1.575 m)  Weight: 196 lb (88.905 kg)   General appearance  Normal Skin grossly normal Head/Neck normal with no cervical or supraclavicular adenopathy thyroid normal Lungs  clear Cardiac RR, without RMG Abdominal  soft, nontender, without masses, organomegaly or hernia Breasts  examined lying and sitting without masses, retractions, discharge or axillary adenopathy. Pelvic  Ext/BUS/vagina  With atrophic genital changes. Mild symmetrical whitish change lateral to the posterior fourchette consistent with lichen sclerosus, no specific lesions.  Cervix  normal with atrophic changes  Uterus  axial, normal size, shape and contour, midline and mobile nontender   Adnexa  Without masses or tenderness    Anus and perineum  normal   Rectovaginal  normal sphincter tone without palpated masses or tenderness.    Assessment/Plan:  72 y.o. [name] female for followup exam.   1. Lichen sclerosus. Occasionally uses Temovate doing well and will continue as needed. 2. Postmenopausal/atrophic genital changes. Patient's asymptomatic without significant hot flushes night sweats or vaginal irritation. We'll continue to monitor. 3. Osteoporosis. Currently on Fosamax monitored by Dr. Kriste Basque. We'll continue to follow up with him in reference to this. 4. Pap smear 2012. No Pap smear done today. Had cryosurgery 1971 with normal Pap smears since then. Options to stop screening altogether she is over the age of 31 versus less frequent screening intervals reviewed. We both agree on stop screening  at this time. 5. Mammography 11/2012. Continued annual mammography. SBE monthly reviewed. 6. Colonoscopy 2010. Repeat at their recommended interval. 7. Health maintenance. No lab work done as it is all done through Dr. Jodelle Green office . Followup in one year, sooner as needed.   Dara Lords MD, 3:30 PM 03/09/2013

## 2013-03-15 ENCOUNTER — Telehealth: Payer: Self-pay | Admitting: Pulmonary Disease

## 2013-03-15 MED ORDER — ALBUTEROL SULFATE HFA 108 (90 BASE) MCG/ACT IN AERS: 2.0000 | INHALATION_SPRAY | Freq: Four times a day (QID) | RESPIRATORY_TRACT | Status: DC | PRN

## 2013-03-15 NOTE — Telephone Encounter (Signed)
Rx has been sent in to [facility] for 30 days and [facility] for a 90 day supply. Pt is aware.

## 2013-03-16 ENCOUNTER — Telehealth: Payer: Self-pay | Admitting: Pulmonary Disease

## 2013-03-16 MED ORDER — ALBUTEROL SULFATE (2.5 MG/3ML) 0.083% IN NEBU: 2.5000 mg | INHALATION_SOLUTION | Freq: Four times a day (QID) | RESPIRATORY_TRACT | Status: DC | PRN

## 2013-03-16 NOTE — Telephone Encounter (Signed)
Spoke with patient, Patient out of nebulizer Requesting 30 day sent to [facility] and 90 day sent to [facility] Rx have been sent and nothing further needed at this time

## 2013-03-19 ENCOUNTER — Encounter: Payer: Self-pay | Admitting: Gynecology

## 2013-03-31 ENCOUNTER — Ambulatory Visit: Payer: Self-pay | Admitting: Gynecology

## 2013-04-21 ENCOUNTER — Ambulatory Visit: Payer: BC Managed Care – PPO | Admitting: Pulmonary Disease

## 2013-05-03 ENCOUNTER — Ambulatory Visit: Payer: Self-pay | Admitting: Gynecology

## 2013-05-18 ENCOUNTER — Encounter: Payer: Self-pay | Admitting: Pulmonary Disease

## 2013-05-18 ENCOUNTER — Ambulatory Visit (INDEPENDENT_AMBULATORY_CARE_PROVIDER_SITE_OTHER): Payer: BC Managed Care – PPO | Admitting: Pulmonary Disease

## 2013-05-18 VITALS — BP 128/72 | HR 67 | Temp 97.6°F | Ht 62.0 in | Wt 198.6 lb

## 2013-05-18 DIAGNOSIS — M810 Age-related osteoporosis without current pathological fracture: Secondary | ICD-10-CM

## 2013-05-18 DIAGNOSIS — N3010 Interstitial cystitis (chronic) without hematuria: Secondary | ICD-10-CM

## 2013-05-18 DIAGNOSIS — C439 Malignant melanoma of skin, unspecified: Secondary | ICD-10-CM

## 2013-05-18 DIAGNOSIS — M1990 Unspecified osteoarthritis, unspecified site: Secondary | ICD-10-CM

## 2013-05-18 DIAGNOSIS — K5730 Diverticulosis of large intestine without perforation or abscess without bleeding: Secondary | ICD-10-CM

## 2013-05-18 DIAGNOSIS — I779 Disorder of arteries and arterioles, unspecified: Secondary | ICD-10-CM

## 2013-05-18 DIAGNOSIS — J45909 Unspecified asthma, uncomplicated: Secondary | ICD-10-CM

## 2013-05-18 DIAGNOSIS — I872 Venous insufficiency (chronic) (peripheral): Secondary | ICD-10-CM

## 2013-05-18 DIAGNOSIS — K219 Gastro-esophageal reflux disease without esophagitis: Secondary | ICD-10-CM

## 2013-05-18 DIAGNOSIS — I059 Rheumatic mitral valve disease, unspecified: Secondary | ICD-10-CM

## 2013-05-18 DIAGNOSIS — J309 Allergic rhinitis, unspecified: Secondary | ICD-10-CM

## 2013-05-18 DIAGNOSIS — I10 Essential (primary) hypertension: Secondary | ICD-10-CM

## 2013-05-18 MED ORDER — BUDESONIDE-FORMOTEROL FUMARATE 80-4.5 MCG/ACT IN AERO: 1.0000 | INHALATION_SPRAY | Freq: Every day | RESPIRATORY_TRACT | Status: DC

## 2013-05-18 MED ORDER — FUROSEMIDE 40 MG PO TABS: 40.0000 mg | ORAL_TABLET | Freq: Every day | ORAL | Status: DC

## 2013-05-18 NOTE — Patient Instructions (Addendum)
Today we updated your med list in our EPIC system...    Continue your current medications the same...  We refilled the meds you requested...  Call for any questions...  Let's plan a follow up visit in 6-77mo w/ FASTING blood work, sooner if needed for problems.Marland KitchenMarland Kitchen

## 2013-05-18 NOTE — Progress Notes (Signed)
Subjective:    Patient ID: Brianna Mccarty, female    DOB: Aug 13, 1941, 72 y.o.   MRN: [id number]  HPI 72 y/o WF here for a follow up visit... she has hx refractory asthmatic bronchitis requiring hosp & IV Solumedrol & max bronchodilator Rx etc... in the outpt setting she gets frequent courses of Prednisone & we have endeavored to maximize her inhaled regimen so as to keep her out of the hosp & off the oral steroids as much as poss... she also sees DrESL/VanWinkle for allergy shots, and she has seen DrKozlow in the past who felt that she had a major component of reflux causing her refractory airways dis...  SEE PREV EPIC NOTES FOR EARLIER DATA >>  ~  September 18, 2011:  88mo ROV & she reports "the best yr in recent memory" and she attributes some of the success to a new supplement "nopalea" that she states cuts down inflamm; her last Pred was 5/12 & doing satis on her other meds at present...  BP controlled on meds; denies CP, palpit, ch in SOB, edema, or cerebral ischemic symptoms; she remains on Simva20 + RYR and f/u FLP looks good; she remains on Prev30Bid & feels she still needs the Bid dosing; IC controlled on meds per Urology; See prob list below>> she requests refills today & 2012 Flu vaccine...  ~  April 22, 2012:  85mo ROV & [name] continues to do satis from the pulm standpoint w/o exacerbation;  She had left breast biopsy 1/13 that turned out benign- prob fibroadenoma;  She continues on Nopalea supplement w/ it's anti-inflamm properties;  She saw DrFontaine 2/13> hx lichen sclerosis- uses Temovate cream & doing well, he deferred Osteoporosis testing & follow up to [place] & she is noted to be on both Evista & Alendronate; she wants to stop the Evista due to expense & this is ok w/ me;  She also saw DrKatz for Cards f/u 5/13> doing well & no changes made...     We reviewed prob list, meds, xrays and labs> see below>>  ~  October 21, 2012:  88mo ROV & Brianna Mccarty notes doing well w/o recent issues, no new  complaints or concerns... We reviewed the following medical problems during today's office visit >> reminded to bring all meds to every office visit...    AR, Asthma, Bronchitis> on NEBS w/ Albut, Symbicort80, Allegra180; she has last Asthma exac 8/13- treated by DrSharma w/ Depo/ Pred/ ZPak/ & incr Symbicort to 160; she improved & ret to baseline but didn't bring meds to this OV...    HBP, HxCP> on ASA81, Amlod10, Losar100, Minipress5, Lasix40;  BP=144/80 & she denies CP, palpit, SOB, edema; followed by [facility]- seen 5/13 for f/u HBP, CP, SOB; doing satis & no changes made...    Cerebrovasc dis> on ASA81; she had f/u CDopplers 1/12 showing stable mild heterogeneous plaque in bulbs & 0-39% ICA stenoses (incidental 1cm right thyroid cyst).    VenInsuffic> on low sodium, elevation, support hose...    Chol> on Simva20; FLP shows TChol 152, TG 34, HDL 64, LDL 81    GI- HH, GERD, Divertics> on Prev30bid & probiotic daily; she denies abd pain, dysphagia, n/v, c/d, blood seen; known 4cmHH, last colon 9/10 by DrStark w/ divertics only, but there is a FamHx colon ca in uncle, therefore f/u 76yrs...    GU- IC, KidStones> on DDAVP & Elmiron100Tid; she is s/p DMSO treatments; states voiding is fine, denies recent leakage, incont, etc; last stone was 2007.Marland KitchenMarland Kitchen  DJD, Osteopenia> on Norco5, Alendronate70/wk, calcium, MVI, VitD; she is s/p leftTHR by DrGioffre2004; last BMD 1/12 showed TScore -2.3 in [facility]...    HAs> she is s/p eval in HA clinics 1994 & 2003...    HxMelanoma> removed from medial left knee in 2006 by [facility]... We reviewed prob list, meds, xrays and labs> see below for updates >> she had the 2013 Flu vaccine 10/13; she has requested refill prescriptions today... LABS 12/13:  FLP- at goals on Simva20;  Chems- wnl;  CBC- wnl;  TSH=2.43;  VitD=41   ~  May 18, 2013:  39mo ROV & Brianna Mccarty has had a good interval- mild allergy symptoms in the spring, doing well overall & they cut back her allergy shots to every  other wk; she still credits much of her improvement to Nopolea juice that she drinks daily for the last 79yrs- "it's good for inflammation & helps my asthma"...     Asthma is stable on Symbicort80- taking one puff Bid & ProventilHFA vs NEB prn...     BP looks good on Amlod10, Minipress5Bid, Cozaar100, Lasix40; BP= 128/72 & she denies CP, palpit, dizzy, SOB, edema...    Chol has been well regulated on Simva20, RYR, CoQ10 w/ FLP 12/13 within parameters...    GI is stable on Prev30Bid & probiotic daily, followed by DrStark...    GU remains stable on Elmiron & DDAVP; followed by DrPeterson & Fontaine... We reviewed prob list, meds, xrays and labs> see below for updates >>           Problem List:  ALLERGIC RHINITIS (ICD-477.9) ASTHMA (ICD-493.90) - she takes ALLEGRA 180mg /d, SYMBICORT 80- 1spBid, PROAIR & NEBS w/ Albuterol per DrESL; in addition she still gets weekly shots from [facility] allergy clinic, & he treats her w/ freq courses of Pred for exacerbations... ~  CXR 6/09 was clear...  ~  CXR 2/10 hosp showed mild peribronch thickening, NAD... ~  PFT 3/10 showed FVC=2.36 (90%), FEV1=1.78 (85%), %1sec=75, mid-flows= 57%pred. ~  4/10: she saw DrKozlow for second opinion & Rx of her AB & GERD... ~  CXR 2/11 showed clear lungs, NAD... f/u 7/11= same (clear, NAD).Marland Kitchen. ~  RAST testing 5/11 showed IgE= 130, & neg rast tests x cockroach +low titer. ~  CXR 7/11 showed mild cardiomeg, clear lungs, NAD. ~  Sinus CT 8/11 showed no signif inflamm dis, mild membrane thickening, min ethmoid changes on right. ~  PFT 8/11 showed FVC=2.04 (71%), FEV1=1.58 (73%), %1sec=78, mid-flows= 70%pred. ~  11/12:  She reports no recent infectious exac & doing better on a new supplement "nopalea" which she says reduces inflamm. ~  12/13: on NEBS w/ Albut, Symbicort80, Allegra180; she has last Asthma exac 8/13- treated by DrSharma w/ Depo/ Pred/ ZPak/ & incr Symbicort to 160; she improved & ret to baseline but didn't bring  meds to this OV... ~  7/14: Asthma is stable on Symbicort80- taking one puff Bid & ProventilHFA vs NEB prn  BRONCHITIS, RECURRENT (ICD-491.9) - hx freq infectious exac... ~  CTChest 2008 w/ 3mm nodule in RUL- unchanged over 85mo interval, small HH, noncalcif pleural plaques bilat... she is reassured w/ the recent CXR & serial films not showing nodule. ~  no recent resp infections but she likes to keep ZPak handy just in case.  HYPERTENSION (ICD-401.9) &  CHEST PAIN-UNSPECIFIED (ICD-786.50) - controlled on 4 meds:  NORVASC 10mg /d, LOSARTAN 100mg /d, MINIPRESS 5mg Bid, & LASIX 40mg /d... ~  2DEcho 8/09 showed norm LV wall motion & EF= 60-65%, mild incr  AoV thickness, mild LA dil & atrial septal aneurysm noted. ~  EKG 9/10 by DrKatz- NSR, WNL... ~  2DEcho 9/10 showed norm LV wall motion & thickness, EF= 60-65%, gr I DD noted, sl dil LA, right side OK...  ~  9/11:  BP= 130/84, and BP's at home are even better- 120's/ 60's per pt... denies HA, visual changes, CP, palipit, dizziness, syncope, edema, etc... ~  1/12:  She had f/u appt DrKatz- stable, no changes made... ~  5/12:  BP= 122/74 and stable on meds... ~  11/12:  BP= 126/74 & similar at home she says... ~  5/13:  She saw DrKatz for Cards f/u> EKG showed NSR, rate76, poor R progression, NAD; stable & doing well- no changes made... ~  6/13:  BP= 138/78 & she is feeling well; denies CP, palpit, SOB, edema, etc... ~  12/13:  on ASA81, Amlod10, Losar100, Minipress5, Lasix40;  BP=144/80 & she denies CP, palpit, SOB, edema; followed by [facility]- seen 5/13 for f/u HBP, CP, SOB; doing satis & no changes made... ~  7/14: BP looks good on Amlod10, Minipress5Bid, Cozaar100, Lasix40; BP= 128/72 & she denies CP, palpit, dizzy, SOB, edema.  CEREBROVASCULAR DISEASE (ICD-437.9) - on ASA 81mg /d... she had an abn lifeline screen 3/08 w/ left Carotid in the mild/mod range... ~  CDopplers 4/09 showed mild smooth heterogen plaque in bulbs, 0-39% ICA stenoses... ~  f/u  CDoppler 1/12 by Delton See showed mild heterogen plaque in bulbs, 0-39% bilat ICAstenoses... 1cm thyr cyst noted- not palpated.  VENOUS INSUFFICIENCY (ICD-459.81) - she knows to avoid sodium, elevate legs, wear support hose, continue the Lasix...  HYPERCHOLESTEROLEMIA (ICD-272.0) - on SIMVASTATIN 20mg /d + CoQ10 daily... prev intol to statins w/ severe leg cramps but the addition of the CoQ10 has made a world of difference to her tolerability... ~  FLP 3/08 on diet alone showed TChol 268, TG 35, HDL 96, LDL 166... ~  FLP 9/08 on Crestor 5mg /d showed TChol 212, TG 37, HDL 73, LDL 127... continue Rx. ~  FLP 12/09 showed TChol 179, TG 38, HDL 78, LDL 93... continue same Rx. ~  FLP 2/11 on Cres5 showed TChol 161, TG 42, HDL 70, LDL 83... pt switched to Simva20 per request. ~  FLP 9/11 on Simva20 showed TChol 209, TG 42, HDL 72, LDL 127... Same med, better diet, get wt down. ~  FLP 11/12 on Simva20 showed TChol 168, TG 35, HDL 67, LDL 94 ~  FLP 12/13 on simva20 showed TChol 152, TG 34, HDL 64, LDL 81   GERD (ICD-530.81) - on PREVACID 30mg Bid + max antireflux regimen Qhs. ~  last EGD 6/08 by DrSam showed 4cmHH, gastitis, and stricture- dilated... prev HPylori neg in 2007.  DIVERTICULOSIS OF COLON (ICD-562.10) ~  colonoscopy 3/05 by DrSam showed divertics only... f/u planned 25yrs (+fam hx in uncle). ~  colonoscopy 9/10 by DrStark showed divertics only... he plans f/u 36yrs.  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 she has had DMSO in bladder + taking> ELMIRON 100mg Tid, & DDAVP 0.2mg -3tabsHS (off prev Vesicare)... last DMSO treatment ~5/11 per pt. ~  Note:  She is followed by DrFontaine for GYN...  RENAL CALCULUS, HX OF (ICD-V13.01) - required cysto & basket stone removal from right ureter in 2007...  DEGENERATIVE JOINT DISEASE (ICD-715.90) - she is s/p left THR by DrGioffre in 2004...  OSTEOPOROSIS (ICD-733.00) - on ALENDRONATE 70mg /wk started 1/12; + Calcium, MVI, VitD; prev  on Evista & this was stopped 6/13... ~  labs 12/09 showed Vit D level = 34... advised to start Vit D OTC 1-2,000 u daily... ~  BMD 1/12 showed TScores -0.1 in [facility], and -2.3 in [facility]; we recommended starting FOSAMAX 70mg /wk along w/ her Calcium, MVI, VitD 5000u (esp in light of her freq Pred use). ~  6/13:  Pt wanted to know if it was ok to stop her Evista since it is so expensive; she saw DrFontaine & he deferred to [place]- ok to stop Evista.  HEADACHE (ICD-784.0) - prev eval at the Headache clinic- 1994 by DrSpillman, and 2003 by DrFreeman...  Hx of MALIGNANT MELANOMA (ICD-172.9) - [facility] from medial left knee area...  Health Maintenance - she gets the seasonal Flu vaccine yearly... given f/u PNEUMOVAX at age 69 in 2009...   Past Surgical History  Procedure Laterality Date  . Left total hip replacement  2004    Dr. Darrelyn Hillock  . Melanoma removed from medial rleft knee area  2006    Dr. Margo Aye  . Cystoscopy and basket stone removal right ureter  02/2006    Dr. Vonita Moss  . Cardiac catheterization    . Breast surgery  2013    Breast Bx-Benign  . Nasal septum surgery    . Colposcopy    . Gynecologic cryosurgery  1971    Outpatient Encounter Prescriptions as of 05/18/2013  Medication Sig Dispense Refill  . albuterol (PROAIR HFA) 108 (90 BASE) MCG/ACT inhaler Inhale 2 puffs into the lungs every 6 (six) hours as needed for wheezing.  3 Inhaler  3  . albuterol (PROVENTIL) (2.5 MG/3ML) 0.083% nebulizer solution Take 3 mLs (2.5 mg total) by nebulization every 6 (six) hours as needed.  360 mL  0  . alendronate (FOSAMAX) 70 MG tablet Take 1 tablet (70 mg total) by mouth every 7 (seven) days. Take with a full glass of water on an empty stomach.  12 tablet  3  . amLODipine (NORVASC) 10 MG tablet Take 1 tablet (10 mg total) by mouth daily.  90 tablet  3  . Ascorbic Acid (VITAMIN C) 500 MG tablet Take 500 mg by mouth daily.        Marland Kitchen aspirin 81 MG tablet Take 81 mg by mouth daily.         . budesonide-formoterol (SYMBICORT) 80-4.5 MCG/ACT inhaler Inhale 1 puff into the lungs daily. Per Dr. Corinda Gubler      . Calcium Carbonate (CALCIUM 600) 1500 MG TABS Take 1 tablet by mouth daily.        . Cholecalciferol (VITAMIN D3) 5000 UNITS CAPS Take 1 capsule by mouth daily.        . Coenzyme Q10 (COQ10) 100 MG CAPS Take 1 capsule by mouth daily.        Marland Kitchen desmopressin (DDAVP) 0.2 MG tablet Take 3 tablets by mouth daily at bedtime       . fexofenadine (ALLEGRA) 180 MG tablet Take 180 mg by mouth daily.        . furosemide (LASIX) 40 MG tablet Take 1 tablet (40 mg total) by mouth daily.  90 tablet  1  . lansoprazole (PREVACID) 30 MG capsule Take 1 capsule (30 mg total) by mouth 2 (two) times daily.  180 capsule  3  . losartan (COZAAR) 100 MG tablet Take 1 tablet (100 mg total) by mouth daily.  90 tablet  3  . magnesium oxide (MAG-OX) 400 MG tablet Take 400 mg by mouth daily.        Marland Kitchen  Multiple Vitamin (MULTIVITAMIN) capsule Take 1 capsule by mouth daily.        . pentosan polysulfate (ELMIRON) 100 MG capsule Take 100 mg by mouth 3 (three) times daily before meals.        . prazosin (MINIPRESS) 5 MG capsule TAKE 1 CAPSULE (5 MG TOTAL) BY MOUTH 2 (TWO) TIMES DAILY.  30 capsule  5  . PRESCRIPTION MEDICATION Allergy shots every other week      . Probiotic Product (PROBIOTIC PO) 1 daily      . simvastatin (ZOCOR) 20 MG tablet Take 1 tablet (20 mg total) by mouth at bedtime.  90 tablet  3  . [DISCONTINUED] HYDROcodone-acetaminophen (NORCO) 5-325 MG per tablet Take 1 tablet by mouth every 6 (six) hours as needed.       No facility-administered encounter medications on file as of 05/18/2013.    Allergies  Allergen Reactions  . Cephalexin     REACTION: high fever  . Penicillins     REACTION: severe swelling and hives  . Phenobarbital     REACTION: hives    Review of Systems         See HPI - all other systems neg except as noted... The patient complains of dyspnea on exertion.  The patient  denies anorexia, fever, weight loss, weight gain, vision loss, decreased hearing, hoarseness, chest pain, syncope, peripheral edema, prolonged cough, headaches, hemoptysis, abdominal pain, melena, hematochezia, severe indigestion/heartburn, hematuria, incontinence, muscle weakness, suspicious skin lesions, transient blindness, difficulty walking, depression, unusual weight change, abnormal bleeding, enlarged lymph nodes, and angioedema.     Objective:   Physical Exam      WD, Overweight, 72 y/o WF in NAD... Vital Signs:  Reviewed... GENERAL:  Alert & oriented; pleasant & cooperative... HEENT:  [place]/AT, EOM-wnl, PERRLA, EACs-clear, TMs- some wax, NOSE-clear, THROAT- resolved, clear now. NECK:  Supple w/ fairROM; no JVD; normal carotid impulses w/o bruits; no thyromegaly or nodules palpated; no lymphadenopathy. CHEST:  essentially clear now- without wheezing, rales, or signs of consolidation... min forced end-exp wheeze. HEART:  Regular Rhythm; without murmurs/ rubs/ or gallops heard... ABDOMEN:  Obese, soft & nontender; normal bowel sounds; no organomegaly or masses palpated... EXT: without deformities, mild arthritic changes; no varicose veins/ +venous insuffic/ tr edema. NEURO:  CN's intact;  no focal neuro deficits... DERM:  No lesions noted; no rash etc...  RADIOLOGY DATA:  Reviewed in the EPIC EMR & discussed w/ the patient...  LABORATORY DATA:  Reviewed in the EPIC EMR & discussed w/ the patient...   Assessment & Plan:    ASTHMA>  On regimen as above, she indicates doing well & only needed Pred once since 5/12 due to "Nopalea" supplement.Marland Kitchen  HBP>  Controlled on meds, continue same, get wt down...  Cerebrovasc dis> on ASA w/o cerebral ischemic symptoms & CDoppler 1/12 w/o acute change or progressive plaque etc...  CHOL> on Simva20, has added RYR she says, & FLP looks good...  GI>  Hx GERD stable on PPI Bid...  IC>  Followed by DrPeterson & stable by her report...  DJD/  OSteopenia>  Now on Fosamax along w/ her Calcium, MVI, Vit D...  Other medical problems as noted...   Patient's Medications  New Prescriptions   No medications on file  Previous Medications   ALBUTEROL (PROAIR HFA) 108 (90 BASE) MCG/ACT INHALER    Inhale 2 puffs into the lungs every 6 (six) hours as needed for wheezing.   ALBUTEROL (PROVENTIL) (2.5 MG/3ML) 0.083% NEBULIZER SOLUTION  Take 3 mLs (2.5 mg total) by nebulization every 6 (six) hours as needed.   ALENDRONATE (FOSAMAX) 70 MG TABLET    Take 1 tablet (70 mg total) by mouth every 7 (seven) days. Take with a full glass of water on an empty stomach.   AMLODIPINE (NORVASC) 10 MG TABLET    Take 1 tablet (10 mg total) by mouth daily.   ASCORBIC ACID (VITAMIN C) 500 MG TABLET    Take 500 mg by mouth daily.     ASPIRIN 81 MG TABLET    Take 81 mg by mouth daily.     CALCIUM CARBONATE (CALCIUM 600) 1500 MG TABS    Take 1 tablet by mouth daily.     CHOLECALCIFEROL (VITAMIN D3) 5000 UNITS CAPS    Take 1 capsule by mouth daily.     COENZYME Q10 (COQ10) 100 MG CAPS    Take 1 capsule by mouth daily.     DESMOPRESSIN (DDAVP) 0.2 MG TABLET    Take 3 tablets by mouth daily at bedtime    FEXOFENADINE (ALLEGRA) 180 MG TABLET    Take 180 mg by mouth daily.     LANSOPRAZOLE (PREVACID) 30 MG CAPSULE    Take 1 capsule (30 mg total) by mouth 2 (two) times daily.   LOSARTAN (COZAAR) 100 MG TABLET    Take 1 tablet (100 mg total) by mouth daily.   MAGNESIUM OXIDE (MAG-OX) 400 MG TABLET    Take 400 mg by mouth daily.     MULTIPLE VITAMIN (MULTIVITAMIN) CAPSULE    Take 1 capsule by mouth daily.     PENTOSAN POLYSULFATE (ELMIRON) 100 MG CAPSULE    Take 100 mg by mouth 3 (three) times daily before meals.     PRAZOSIN (MINIPRESS) 5 MG CAPSULE    TAKE 1 CAPSULE (5 MG TOTAL) BY MOUTH 2 (TWO) TIMES DAILY.   PRESCRIPTION MEDICATION    Allergy shots every other week   PROBIOTIC PRODUCT (PROBIOTIC PO)    1 daily   SIMVASTATIN (ZOCOR) 20 MG TABLET    Take 1 tablet (20  mg total) by mouth at bedtime.  Modified Medications   Modified Medication Previous Medication   BUDESONIDE-FORMOTEROL (SYMBICORT) 80-4.5 MCG/ACT INHALER budesonide-formoterol (SYMBICORT) 80-4.5 MCG/ACT inhaler      Inhale 1 puff into the lungs daily. Per Dr. Corinda Gubler    Inhale 1 puff into the lungs daily. Per Dr. Corinda Gubler   FUROSEMIDE (LASIX) 40 MG TABLET furosemide (LASIX) 40 MG tablet      Take 1 tablet (40 mg total) by mouth daily.    Take 1 tablet (40 mg total) by mouth daily.  Discontinued Medications   HYDROCODONE-ACETAMINOPHEN (NORCO) 5-325 MG PER TABLET    Take 1 tablet by mouth every 6 (six) hours as needed.

## 2013-07-14 ENCOUNTER — Telehealth: Payer: Self-pay | Admitting: Pulmonary Disease

## 2013-07-14 MED ORDER — PREDNISONE (PAK) 5 MG PO TABS: ORAL_TABLET | ORAL | Status: DC

## 2013-07-14 NOTE — Telephone Encounter (Signed)
Per SN--  Ok to call in pred dosepak #1  Take as directed with no refills.

## 2013-07-14 NOTE — Telephone Encounter (Signed)
Asthma flare with dry/tickle cough, wheezing, chest tightness, eyes itching x3 days.  Has been doing nebs, saline nasal and otc eye drops.  [place]14.  Pt is requesting prednisone rx.  Last ov 7.15.14 w/ SN [facility] [address] Allergies  Allergen Reactions  . Cephalexin     REACTION: high fever  . Penicillins     REACTION: severe swelling and hives  . Phenobarbital     REACTION: hives   Dr Kriste Basque please advise, thank you.

## 2013-07-14 NOTE — Telephone Encounter (Signed)
Rx was sent to [facility], In with the pt and notified that this was done

## 2013-07-27 ENCOUNTER — Telehealth: Payer: Self-pay | Admitting: Pulmonary Disease

## 2013-07-27 NOTE — Telephone Encounter (Signed)
Spoke with patient -- Patient would like to know if we are getting the "high dose" Flu vaccines I advised patient that we were said to be receiving them but at this time they are not available from our office Patient requested we call her if we do receive these and if we do not so she can get from somewhere else Will forward to Dr. Kirstie Mirza nurse

## 2013-07-27 NOTE — Telephone Encounter (Signed)
Spoke with patient made her aware Patient verbalized understanding and nothing further needed at this time.

## 2013-07-27 NOTE — Telephone Encounter (Signed)
These have been ordered but not sure when these will come in.  She can get the high dose from the pharmacy and they will inform us of when she gets this done so we can update her immunization list.

## 2013-08-09 ENCOUNTER — Telehealth: Payer: Self-pay | Admitting: Pulmonary Disease

## 2013-08-09 MED ORDER — PREDNISONE (PAK) 5 MG PO TABS: ORAL_TABLET | ORAL | Status: DC

## 2013-08-09 MED ORDER — AZITHROMYCIN 250 MG PO TABS: ORAL_TABLET | ORAL | Status: DC

## 2013-08-09 NOTE — Telephone Encounter (Signed)
Per SN---  zpak #1  Take as directed pred dosepak  5 mg  6 day pack to take as directed

## 2013-08-09 NOTE — Telephone Encounter (Signed)
I spoke with pt. She c/o cough w/ clear phlem, wheezing, chest tx, increase SOB w/ activity, runny nose, PND, watery/itchy eyes x Friday. She is requesting zpak and prednisone with refill bc pt is going to [facility] in case she needs it. Please advise SN thanks Last OV 05/18/13 Pending 12/25/13 [facility] [address] Allergies  Allergen Reactions  . Cephalexin     REACTION: high fever  . Penicillins     REACTION: severe swelling and hives  . Phenobarbital     REACTION: hives

## 2013-08-09 NOTE — Telephone Encounter (Signed)
I spoke with pt and is aware of recs. rx has been sent 

## 2013-10-05 ENCOUNTER — Encounter (INDEPENDENT_AMBULATORY_CARE_PROVIDER_SITE_OTHER): Payer: Self-pay

## 2013-10-05 ENCOUNTER — Encounter: Payer: Self-pay | Admitting: Adult Health

## 2013-10-05 ENCOUNTER — Ambulatory Visit (INDEPENDENT_AMBULATORY_CARE_PROVIDER_SITE_OTHER): Payer: BC Managed Care – PPO | Admitting: Adult Health

## 2013-10-05 VITALS — BP 120/78 | HR 84 | Temp 97.8°F | Ht 62.5 in | Wt 201.0 lb

## 2013-10-05 DIAGNOSIS — J45909 Unspecified asthma, uncomplicated: Secondary | ICD-10-CM

## 2013-10-05 MED ORDER — PREDNISONE 10 MG PO TABS: ORAL_TABLET | ORAL | Status: DC

## 2013-10-05 MED ORDER — AZITHROMYCIN 250 MG PO TABS: ORAL_TABLET | ORAL | Status: DC

## 2013-10-05 NOTE — Progress Notes (Signed)
Subjective:    Patient ID: Brianna Mccarty, female    DOB: [birth year], 72 y.o.   MRN: [id number]  HPI 72 y/o WF here for a follow up visit... she has hx refractory asthmatic bronchitis requiring hosp & IV Solumedrol & max bronchodilator Rx etc... in the outpt setting she gets frequent courses of Prednisone & we have endeavored to maximize her inhaled regimen so as to keep her out of the hosp & off the oral steroids as much as poss... she also sees DrESL/VanWinkle for allergy shots, and she has seen DrKozlow in the past who felt that she had a major component of reflux causing her refractory airways dis...  SEE PREV EPIC NOTES FOR EARLIER DATA >>  ~  September 18, 2011:  65mo ROV & she reports "the best yr in recent memory" and she attributes some of the success to a new supplement "nopalea" that she states cuts down inflamm; her last Pred was 5/12 & doing satis on her other meds at present...  BP controlled on meds; denies CP, palpit, ch in SOB, edema, or cerebral ischemic symptoms; she remains on Simva20 + RYR and f/u FLP looks good; she remains on Prev30Bid & feels she still needs the Bid dosing; IC controlled on meds per Urology; See prob list below>> she requests refills today & 2012 Flu vaccine...  ~  April 22, 2012:  21mo ROV & Brianna Mccarty continues to do satis from the pulm standpoint w/o exacerbation;  She had left breast biopsy 1/13 that turned out benign- prob fibroadenoma;  She continues on Nopalea supplement w/ it's anti-inflamm properties;  She saw DrFontaine 2/13> hx lichen sclerosis- uses Temovate cream & doing well, he deferred Osteoporosis testing & follow up to [place] & she is noted to be on both Evista & Alendronate; she wants to stop the Evista due to expense & this is ok w/ me;  She also saw DrKatz for Cards f/u 5/13> doing well & no changes made...     We reviewed prob list, meds, xrays and labs> see below>>  ~  October 21, 2012:  65mo ROV & Brianna Mccarty notes doing well w/o recent issues, no new  complaints or concerns... We reviewed the following medical problems during today's office visit >> reminded to bring all meds to every office visit...    AR, Asthma, Bronchitis> on NEBS w/ Albut, Symbicort80, Allegra180; she has last Asthma exac 8/13- treated by DrSharma w/ Depo/ Pred/ ZPak/ & incr Symbicort to 160; she improved & ret to baseline but didn't bring meds to this OV...    HBP, HxCP> on ASA81, Amlod10, Losar100, Minipress5, Lasix40;  BP=144/80 & she denies CP, palpit, SOB, edema; followed by [facility]- seen 5/13 for f/u HBP, CP, SOB; doing satis & no changes made...    Cerebrovasc dis> on ASA81; she had f/u CDopplers 1/12 showing stable mild heterogeneous plaque in bulbs & 0-39% ICA stenoses (incidental 1cm right thyroid cyst).    VenInsuffic> on low sodium, elevation, support hose...    Chol> on Simva20; FLP shows TChol 152, TG 34, HDL 64, LDL 81    GI- HH, GERD, Divertics> on Prev30bid & probiotic daily; she denies abd pain, dysphagia, n/v, c/d, blood seen; known 4cmHH, last colon 9/10 by DrStark w/ divertics only, but there is a FamHx colon ca in uncle, therefore f/u 26yrs...    GU- IC, KidStones> on DDAVP & Elmiron100Tid; she is s/p DMSO treatments; states voiding is fine, denies recent leakage, incont, etc; last stone was 2007.Marland KitchenMarland Kitchen  DJD, Osteopenia> on Norco5, Alendronate70/wk, calcium, MVI, VitD; she is s/p leftTHR by DrGioffre2004; last BMD 1/12 showed TScore -2.3 in [facility]...    HAs> she is s/p eval in HA clinics 1994 & 2003...    HxMelanoma> removed from medial left knee in 2006 by [facility]... We reviewed prob list, meds, xrays and labs> see below for updates >> she had the 2013 Flu vaccine 10/13; she has requested refill prescriptions today... LABS 12/13:  FLP- at goals on Simva20;  Chems- wnl;  CBC- wnl;  TSH=2.43;  VitD=41   ~  May 18, 2013:  33mo ROV & Brianna Mccarty has had a good interval- mild allergy symptoms in the spring, doing well overall & they cut back her allergy shots to every  other wk; she still credits much of her improvement to Nopolea juice that she drinks daily for the last 92yrs- "it's good for inflammation & helps my asthma"...     Asthma is stable on Symbicort80- taking one puff Bid & ProventilHFA vs NEB prn...     BP looks good on Amlod10, Minipress5Bid, Cozaar100, Lasix40; BP= 128/72 & she denies CP, palpit, dizzy, SOB, edema...    Chol has been well regulated on Simva20, RYR, CoQ10 w/ FLP 12/13 within parameters...    GI is stable on Prev30Bid & probiotic daily, followed by DrStark...    GU remains stable on Elmiron & DDAVP; followed by DrPeterson & Fontaine... We reviewed prob list, meds, xrays and labs> see below for updates >>    10/05/2013 Acute OV  Complains of increased SOB, chest tightness, occ wheezing, dry cough that occasionally produces small amounts of yellow mucus x5days - denies f/c/s, hemoptysis, nausea, vomiting, edema. Patient has been using Mucinex and Hydromet, with some relief. Cough is keeping her up at night. Patient denies any hemoptysis, orthopnea, PND, or leg swelling No recent travel or antibiotic use. Patient has significant postnasal drip and drainage. Despite Allegra daily. Patient is taking Symbicort 1 puff twice daily.           Problem List:  ALLERGIC RHINITIS (ICD-477.9) ASTHMA (ICD-493.90) - she takes ALLEGRA 180mg /d, SYMBICORT 80- 1spBid, PROAIR & NEBS w/ Albuterol per DrESL; in addition she still gets weekly shots from [facility] allergy clinic, & he treats her w/ freq courses of Pred for exacerbations... ~  CXR 6/09 was clear...  ~  CXR 2/10 hosp showed mild peribronch thickening, NAD... ~  PFT 3/10 showed FVC=2.36 (90%), FEV1=1.78 (85%), %1sec=75, mid-flows= 57%pred. ~  4/10: she saw DrKozlow for second opinion & Rx of her AB & GERD... ~  CXR 2/11 showed clear lungs, NAD... f/u 7/11= same (clear, NAD).Marland Kitchen. ~  RAST testing 5/11 showed IgE= 130, & neg rast tests x cockroach +low titer. ~  CXR 7/11 showed mild  cardiomeg, clear lungs, NAD. ~  Sinus CT 8/11 showed no signif inflamm dis, mild membrane thickening, min ethmoid changes on right. ~  PFT 8/11 showed FVC=2.04 (71%), FEV1=1.58 (73%), %1sec=78, mid-flows= 70%pred. ~  11/12:  She reports no recent infectious exac & doing better on a new supplement "nopalea" which she says reduces inflamm. ~  12/13: on NEBS w/ Albut, Symbicort80, Allegra180; she has last Asthma exac 8/13- treated by DrSharma w/ Depo/ Pred/ ZPak/ & incr Symbicort to 160; she improved & ret to baseline but didn't bring meds to this OV... ~  7/14: Asthma is stable on Symbicort80- taking one puff Bid & ProventilHFA vs NEB prn  BRONCHITIS, RECURRENT (ICD-491.9) - hx freq infectious exac.Marland KitchenMarland Kitchen ~  CTChest 2008 w/ 3mm nodule in RUL- unchanged over 76mo interval, small HH, noncalcif pleural plaques bilat... she is reassured w/ the recent CXR & serial films not showing nodule. ~  no recent resp infections but she likes to keep ZPak handy just in case.  HYPERTENSION (ICD-401.9) &  CHEST PAIN-UNSPECIFIED (ICD-786.50) - controlled on 4 meds:  NORVASC 10mg /d, LOSARTAN 100mg /d, MINIPRESS 5mg Bid, & LASIX 40mg /d... ~  2DEcho 8/09 showed norm LV wall motion & EF= 60-65%, mild incr AoV thickness, mild LA dil & atrial septal aneurysm noted. ~  EKG 9/10 by DrKatz- NSR, WNL... ~  2DEcho 9/10 showed norm LV wall motion & thickness, EF= 60-65%, gr I DD noted, sl dil LA, right side OK...  ~  9/11:  BP= 130/84, and BP's at home are even better- 120's/ 60's per pt... denies HA, visual changes, CP, palipit, dizziness, syncope, edema, etc... ~  1/12:  She had f/u appt DrKatz- stable, no changes made... ~  5/12:  BP= 122/74 and stable on meds... ~  11/12:  BP= 126/74 & similar at home she says... ~  5/13:  She saw DrKatz for Cards f/u> EKG showed NSR, rate76, poor R progression, NAD; stable & doing well- no changes made... ~  6/13:  BP= 138/78 & she is feeling well; denies CP, palpit, SOB, edema, etc... ~  12/13:   on ASA81, Amlod10, Losar100, Minipress5, Lasix40;  BP=144/80 & she denies CP, palpit, SOB, edema; followed by [facility]- seen 5/13 for f/u HBP, CP, SOB; doing satis & no changes made... ~  7/14: BP looks good on Amlod10, Minipress5Bid, Cozaar100, Lasix40; BP= 128/72 & she denies CP, palpit, dizzy, SOB, edema.  CEREBROVASCULAR DISEASE (ICD-437.9) - on ASA 81mg /d... she had an abn lifeline screen 3/08 w/ left Carotid in the mild/mod range... ~  CDopplers 4/09 showed mild smooth heterogen plaque in bulbs, 0-39% ICA stenoses... ~  f/u CDoppler 1/12 by Delton See showed mild heterogen plaque in bulbs, 0-39% bilat ICAstenoses... 1cm thyr cyst noted- not palpated.  VENOUS INSUFFICIENCY (ICD-459.81) - she knows to avoid sodium, elevate legs, wear support hose, continue the Lasix...  HYPERCHOLESTEROLEMIA (ICD-272.0) - on SIMVASTATIN 20mg /d + CoQ10 daily... prev intol to statins w/ severe leg cramps but the addition of the CoQ10 has made a world of difference to her tolerability... ~  FLP 3/08 on diet alone showed TChol 268, TG 35, HDL 96, LDL 166... ~  FLP 9/08 on Crestor 5mg /d showed TChol 212, TG 37, HDL 73, LDL 127... continue Rx. ~  FLP 12/09 showed TChol 179, TG 38, HDL 78, LDL 93... continue same Rx. ~  FLP 2/11 on Cres5 showed TChol 161, TG 42, HDL 70, LDL 83... pt switched to Simva20 per request. ~  FLP 9/11 on Simva20 showed TChol 209, TG 42, HDL 72, LDL 127... Same med, better diet, get wt down. ~  FLP 11/12 on Simva20 showed TChol 168, TG 35, HDL 67, LDL 94 ~  FLP 12/13 on simva20 showed TChol 152, TG 34, HDL 64, LDL 81   GERD (ICD-530.81) - on PREVACID 30mg Bid + max antireflux regimen Qhs. ~  last EGD 6/08 by DrSam showed 4cmHH, gastitis, and stricture- dilated... prev HPylori neg in 2007.  DIVERTICULOSIS OF COLON (ICD-562.10) ~  colonoscopy 3/05 by DrSam showed divertics only... f/u planned 22yrs (+fam hx in uncle). ~  colonoscopy 9/10 by DrStark showed divertics only... he plans f/u  50yrs.  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 she has had DMSO in bladder +  taking> ELMIRON 100mg Tid, & DDAVP 0.2mg -3tabsHS (off prev Vesicare)... last DMSO treatment ~5/11 per pt. ~  Note:  She is followed by DrFontaine for GYN...  RENAL CALCULUS, HX OF (ICD-V13.01) - required cysto & basket stone removal from right ureter in 2007...  DEGENERATIVE JOINT DISEASE (ICD-715.90) - she is s/p left THR by DrGioffre in 2004...  OSTEOPOROSIS (ICD-733.00) - on ALENDRONATE 70mg /wk started 1/12; + Calcium, MVI, VitD; prev on Evista & this was stopped 6/13... ~  labs 12/09 showed Vit D level = 34... advised to start Vit D OTC 1-2,000 u daily... ~  BMD 1/12 showed TScores -0.1 in [facility], and -2.3 in [facility]; we recommended starting FOSAMAX 70mg /wk along w/ her Calcium, MVI, VitD 5000u (esp in light of her freq Pred use). ~  6/13:  Pt wanted to know if it was ok to stop her Evista since it is so expensive; she saw DrFontaine & he deferred to [place]- ok to stop Evista.  HEADACHE (ICD-784.0) - prev eval at the Headache clinic- 1994 by DrSpillman, and 2003 by DrFreeman...  Hx of MALIGNANT MELANOMA (ICD-172.9) - removed by [facility] in 2006 from medial left knee area...  Health Maintenance - she gets the seasonal Flu vaccine yearly... given f/u PNEUMOVAX at age 31 in 2009...   Past Surgical History  Procedure Laterality Date  . Left total hip replacement  2004    Dr. Darrelyn Hillock  . Melanoma removed from medial rleft knee area  2006    Dr. Margo Aye  . Cystoscopy and basket stone removal right ureter  02/2006    Dr. Vonita Moss  . Cardiac catheterization    . Breast surgery  2013    Breast Bx-Benign  . Nasal septum surgery    . Colposcopy    . Gynecologic cryosurgery  1971    Outpatient Encounter Prescriptions as of 10/05/2013  Medication Sig  . albuterol (PROAIR HFA) 108 (90 BASE) MCG/ACT inhaler Inhale 2 puffs into the lungs every 6 (six) hours as needed for wheezing.  Marland Kitchen albuterol  (PROVENTIL) (2.5 MG/3ML) 0.083% nebulizer solution Take 3 mLs (2.5 mg total) by nebulization every 6 (six) hours as needed.  Marland Kitchen alendronate (FOSAMAX) 70 MG tablet Take 1 tablet (70 mg total) by mouth every 7 (seven) days. Take with a full glass of water on an empty stomach.  Marland Kitchen amLODipine (NORVASC) 10 MG tablet Take 1 tablet (10 mg total) by mouth daily.  . Ascorbic Acid (VITAMIN C) 500 MG tablet Take 500 mg by mouth daily.    Marland Kitchen aspirin 81 MG tablet Take 81 mg by mouth daily.    . budesonide-formoterol (SYMBICORT) 80-4.5 MCG/ACT inhaler Inhale 1 puff into the lungs daily. Per Dr. Corinda Gubler  . Calcium Carbonate (CALCIUM 600) 1500 MG TABS Take 1 tablet by mouth daily.    . Cholecalciferol (VITAMIN D3) 5000 UNITS CAPS Take 1 capsule by mouth daily.    . Coenzyme Q10 (COQ10) 100 MG CAPS Take 1 capsule by mouth daily.    Marland Kitchen desmopressin (DDAVP) 0.2 MG tablet Take 3 tablets by mouth daily at bedtime   . fexofenadine (ALLEGRA) 180 MG tablet Take 180 mg by mouth daily.    . furosemide (LASIX) 40 MG tablet Take 1 tablet (40 mg total) by mouth daily.  . lansoprazole (PREVACID) 30 MG capsule Take 1 capsule (30 mg total) by mouth 2 (two) times daily.  Marland Kitchen losartan (COZAAR) 100 MG tablet Take 1 tablet (100 mg total) by mouth daily.  . magnesium oxide (MAG-OX) 400 MG tablet  Take 400 mg by mouth daily.    . Multiple Vitamin (MULTIVITAMIN) capsule Take 1 capsule by mouth daily.    . pentosan polysulfate (ELMIRON) 100 MG capsule Take 100 mg by mouth 3 (three) times daily before meals.    . prazosin (MINIPRESS) 5 MG capsule TAKE 1 CAPSULE (5 MG TOTAL) BY MOUTH 2 (TWO) TIMES DAILY.  Marland Kitchen PRESCRIPTION MEDICATION Allergy shots every other week  . Probiotic Product (PROBIOTIC PO) 1 daily  . simvastatin (ZOCOR) 20 MG tablet Take 1 tablet (20 mg total) by mouth at bedtime.  . [DISCONTINUED] azithromycin (ZITHROMAX) 250 MG tablet Take as directed  . [DISCONTINUED] predniSONE (STERAPRED UNI-PAK) 5 MG TABS tablet Take as directed     Allergies  Allergen Reactions  . Cephalexin     REACTION: high fever  . Penicillins     REACTION: severe swelling and hives  . Phenobarbital     REACTION: hives    Review of Systems         See HPI - all other systems neg except as noted... The patient complains of dyspnea on exertion.  The patient denies anorexia, fever, weight loss, weight gain, vision loss, decreased hearing, hoarseness, chest pain, syncope, peripheral edema,headaches, hemoptysis, abdominal pain, melena, hematochezia, severe indigestion/heartburn, hematuria, incontinence, muscle weakness, suspicious skin lesions, transient blindness, difficulty walking, depression, unusual weight change, abnormal bleeding, enlarged lymph nodes, and angioedema.     Objective:   Physical Exam      WD, Overweight, 72 y/o WF in NAD... Vital Signs:  Reviewed... GENERAL:  Alert & oriented; pleasant & cooperative... HEENT:  [place]/AT, EOM-wnl, PERRLA, EACs-clear, TMs- some wax, NOSE-clear drainage  THROAT- resolved, clear now. NECK:  Supple w/ fairROM; no JVD; normal carotid impulses w/o bruits; no thyromegaly or nodules palpated; no lymphadenopathy. CHEST:  essentially clear - without wheezing, rales, or signs of consolidation... min forced end-exp wheeze. Dry cough.  HEART:  Regular Rhythm; without murmurs/ rubs/ or gallops heard... ABDOMEN:  Obese, soft & nontender; normal bowel sounds; no organomegaly or masses palpated... EXT: without deformities, mild arthritic changes; no varicose veins/ +venous insuffic/ tr edema. NEURO:  CN's intact;  no focal neuro deficits... DERM:  No lesions noted; no rash etc...    Assessment & Plan:

## 2013-10-05 NOTE — Assessment & Plan Note (Signed)
Exacerbation with AR flare   Plan  Zpack take as directed.  Mucinex DM Twice daily  As needed  Cough/congestion  Prednisone taper over next week  Fluids and rest  Allegra 180mg  daily in am  Add Chlortrimeton 4mg  2 at bedtime for drainage.  Increase Symbicort 80/4.42mcg 2 puffs Twice daily  , rinse after use.  Please contact office for sooner follow up if symptoms do not improve or worsen or seek emergency care  Follow up Dr. Kriste Basque  As planned

## 2013-10-05 NOTE — Patient Instructions (Addendum)
Zpack take as directed.  Mucinex DM Twice daily  As needed  Cough/congestion  Prednisone taper over next week  Fluids and rest  Allegra 180mg  daily in am  Add Chlortrimeton 4mg  2 at bedtime for drainage.  Increase Symbicort 80/4.81mcg 2 puffs Twice daily  , rinse after use.  Please contact office for sooner follow up if symptoms do not improve or worsen or seek emergency care  Follow up Dr. Kriste Basque  As planned

## 2013-10-12 ENCOUNTER — Other Ambulatory Visit: Payer: Self-pay | Admitting: Adult Health

## 2013-10-15 NOTE — Telephone Encounter (Signed)
Electronic refill request received for pred taper and zpak that were rx'd at the 12.9.14 ov w/ TP LMOM TCB x1 for pt to get symptoms/see if she is still sick  Per 12.9.14 ov w/ TP: Patient Instructions     Zpack take as directed.  Mucinex DM Twice daily As needed Cough/congestion  Prednisone taper over next week  Fluids and rest  Allegra 180mg  daily in am  Add Chlortrimeton 4mg  2 at bedtime for drainage.  Increase Symbicort 80/4.12mcg 2 puffs Twice daily , rinse after use.  Please contact office for sooner follow up if symptoms do not improve or worsen or seek emergency care  Follow up Dr. Kriste Basque As planned

## 2013-10-20 ENCOUNTER — Other Ambulatory Visit: Payer: Self-pay | Admitting: Pulmonary Disease

## 2013-10-20 MED ORDER — PRAZOSIN HCL 5 MG PO CAPS: ORAL_CAPSULE | ORAL | Status: DC

## 2013-10-20 MED ORDER — LANSOPRAZOLE 30 MG PO CPDR: 30.0000 mg | DELAYED_RELEASE_CAPSULE | Freq: Two times a day (BID) | ORAL | Status: DC

## 2013-10-20 MED ORDER — LOSARTAN POTASSIUM 100 MG PO TABS: 100.0000 mg | ORAL_TABLET | Freq: Every day | ORAL | Status: DC

## 2013-10-21 NOTE — Telephone Encounter (Signed)
Called spoke with patient who stated that she went to [place] and [place] on 1 week ago and had requested refills on the 12.9.14 zpak and pred taper to have on hold if needed.  She ended up doing well on the trip with no issues. Now reports using SABA this morning and will be going to out of town again on 12.23.14 to [place] x5 days Would like to know if can have rx for zpak and pred taper to have on hand while out of town [facility] [address] to [employer] please advise, thank you.

## 2013-10-21 NOTE — Telephone Encounter (Signed)
Pt is returning [name]'s call & can be reached at [phone].  Brianna Mccarty

## 2013-10-21 NOTE — Telephone Encounter (Signed)
LMOM TCB x2

## 2013-10-22 NOTE — Telephone Encounter (Signed)
Per TP: okay for zpak and pred taper to have on hold if needed while out of town.  Rx's sent to pharmacy Detailed message left on pt's voice mail informing her of the above Advised pt to please call with any questions/concerns Will sign off

## 2013-11-24 ENCOUNTER — Telehealth: Payer: Self-pay | Admitting: Pulmonary Disease

## 2013-11-24 MED ORDER — AMLODIPINE BESYLATE 10 MG PO TABS: 10.0000 mg | ORAL_TABLET | Freq: Every day | ORAL | Status: DC

## 2013-11-24 NOTE — Telephone Encounter (Signed)
Pt is requesting a 90 day supply for amlodipine be sent to [facility] [address]. Rx sent. Pt is aware. [place] Bing, CMA

## 2013-12-02 ENCOUNTER — Encounter: Payer: Self-pay | Admitting: Gynecology

## 2013-12-06 ENCOUNTER — Ambulatory Visit (INDEPENDENT_AMBULATORY_CARE_PROVIDER_SITE_OTHER)
Admission: RE | Admit: 2013-12-06 | Discharge: 2013-12-06 | Disposition: A | Discharge status: Home / Self Care | Payer: 59 | Source: Ambulatory Visit | Attending: Pulmonary Disease | Admitting: Pulmonary Disease

## 2013-12-06 ENCOUNTER — Ambulatory Visit (INDEPENDENT_AMBULATORY_CARE_PROVIDER_SITE_OTHER): Payer: 59 | Admitting: Pulmonary Disease

## 2013-12-06 ENCOUNTER — Encounter (INDEPENDENT_AMBULATORY_CARE_PROVIDER_SITE_OTHER): Payer: Self-pay

## 2013-12-06 ENCOUNTER — Encounter: Payer: Self-pay | Admitting: Pulmonary Disease

## 2013-12-06 VITALS — BP 138/72 | HR 63 | Temp 97.7°F | Ht 62.0 in | Wt 199.2 lb

## 2013-12-06 DIAGNOSIS — I059 Rheumatic mitral valve disease, unspecified: Secondary | ICD-10-CM

## 2013-12-06 DIAGNOSIS — M810 Age-related osteoporosis without current pathological fracture: Secondary | ICD-10-CM

## 2013-12-06 DIAGNOSIS — Z23 Encounter for immunization: Secondary | ICD-10-CM

## 2013-12-06 DIAGNOSIS — I872 Venous insufficiency (chronic) (peripheral): Secondary | ICD-10-CM

## 2013-12-06 DIAGNOSIS — F419 Anxiety disorder, unspecified: Secondary | ICD-10-CM

## 2013-12-06 DIAGNOSIS — E7800 Pure hypercholesterolemia, unspecified: Secondary | ICD-10-CM

## 2013-12-06 DIAGNOSIS — I779 Disorder of arteries and arterioles, unspecified: Secondary | ICD-10-CM

## 2013-12-06 DIAGNOSIS — J45909 Unspecified asthma, uncomplicated: Secondary | ICD-10-CM

## 2013-12-06 DIAGNOSIS — N3010 Interstitial cystitis (chronic) without hematuria: Secondary | ICD-10-CM

## 2013-12-06 DIAGNOSIS — K5730 Diverticulosis of large intestine without perforation or abscess without bleeding: Secondary | ICD-10-CM

## 2013-12-06 DIAGNOSIS — J309 Allergic rhinitis, unspecified: Secondary | ICD-10-CM

## 2013-12-06 DIAGNOSIS — M1990 Unspecified osteoarthritis, unspecified site: Secondary | ICD-10-CM

## 2013-12-06 DIAGNOSIS — I10 Essential (primary) hypertension: Secondary | ICD-10-CM

## 2013-12-06 DIAGNOSIS — I739 Peripheral vascular disease, unspecified: Secondary | ICD-10-CM

## 2013-12-06 DIAGNOSIS — K219 Gastro-esophageal reflux disease without esophagitis: Secondary | ICD-10-CM

## 2013-12-06 MED ORDER — ALBUTEROL SULFATE HFA 108 (90 BASE) MCG/ACT IN AERS: 2.0000 | INHALATION_SPRAY | Freq: Four times a day (QID) | RESPIRATORY_TRACT | Status: DC | PRN

## 2013-12-06 MED ORDER — SIMVASTATIN 20 MG PO TABS: 20.0000 mg | ORAL_TABLET | Freq: Every day | ORAL | Status: DC

## 2013-12-06 MED ORDER — ACYCLOVIR 400 MG PO TABS: 400.0000 mg | ORAL_TABLET | Freq: Two times a day (BID) | ORAL | Status: DC

## 2013-12-06 MED ORDER — LOSARTAN POTASSIUM 100 MG PO TABS: 100.0000 mg | ORAL_TABLET | Freq: Every day | ORAL | Status: DC

## 2013-12-06 MED ORDER — FUROSEMIDE 40 MG PO TABS: 40.0000 mg | ORAL_TABLET | Freq: Every day | ORAL | Status: DC

## 2013-12-06 MED ORDER — PANTOPRAZOLE SODIUM 40 MG PO TBEC: 40.0000 mg | DELAYED_RELEASE_TABLET | Freq: Two times a day (BID) | ORAL | Status: DC

## 2013-12-06 MED ORDER — PRAZOSIN HCL 5 MG PO CAPS: ORAL_CAPSULE | ORAL | Status: DC

## 2013-12-06 MED ORDER — BUDESONIDE-FORMOTEROL FUMARATE 160-4.5 MCG/ACT IN AERO: 2.0000 | INHALATION_SPRAY | Freq: Two times a day (BID) | RESPIRATORY_TRACT | Status: DC

## 2013-12-06 NOTE — Patient Instructions (Signed)
Today we updated your med list in our EPIC system...    Continue your current medications the same...    We refilled your meds per request...  Today we did a follow up Spirometry test & as a result we are increasing your SYMBICORT to the 160 inhaler (still 2 puffs twice daily)...   We also wrote a new prescription for ACYCLOVIR tabs 400mg - one tab twice daily for the recurrent fever blister virus...  We also changed your Prevacid to PROTONIX 40mg  take one tab twice daily (30 min before breakfast & dinner is best)...    Remember to elev the head of your bed on 6" blocks....    And don't eat or drink much after dinner in the eve...  We gave you the new PREVNAR-13 for maximal pneumonia protection,...  Today we did your follow up CXR & we will sched a repeat bone density test at your convenience... Please return to our lab one morning this week for your FASTING blood work...    We will contact you w/ the results when available...   Call for any questions...  Let's plan a follow up visit in 69mo, sooner if needed for problems.Marland KitchenMarland Kitchen

## 2013-12-06 NOTE — Progress Notes (Signed)
Subjective:    Patient ID: Brianna Mccarty, female    DOB: 08-Jan-1941, 73 y.o.   MRN: [id number]  HPI 73 y/o WF here for a follow up visit... she has hx refractory asthmatic bronchitis requiring hosp & IV Solumedrol & max bronchodilator Rx etc... in the outpt setting she gets frequent courses of Prednisone & we have endeavored to maximize her inhaled regimen so as to keep her out of the hosp & off the oral steroids as much as poss... she also sees DrESL/VanWinkle for allergy shots, and she has seen DrKozlow in the past who felt that she had a major component of reflux causing her refractory airways dis...  SEE PREV EPIC NOTES FOR EARLIER DATA >>  ~  April 22, 2012:  11moROV & FAlizcontinues to do satis from the pulm standpoint w/o exacerbation;  She had left breast biopsy 1/13 that turned out benign- prob fibroadenoma;  She continues on Nopalea supplement w/ it's anti-inflamm properties;  She saw DrFontaine 25/27>hx lichen sclerosis- uses Temovate cream & doing well, he deferred Osteoporosis testing & follow up to [place]& she is noted to be on both Evista & Alendronate; she wants to stop the Evista due to expense & this is ok w/ me;  She also saw DrKatz for Cards f/u 5/13> doing well & no changes made...     We reviewed prob list, meds, xrays and labs> see below>>  ~  October 21, 2012:  632moOV & Brianna Mccarty notes doing well w/o recent issues, no new complaints or concerns... We reviewed the following medical problems during today's office visit >> reminded to bring all meds to every office visit...    AR, Asthma, Bronchitis> on NEBS w/ Albut, Symbicort80, Allegra180; she has last Asthma exac 8/13- treated by DrSharma w/ Depo/ Pred/ ZPak/ & incr Symbicort to 160; she improved & ret to baseline but didn't bring meds to this OV...    HBP, HxCP> on ASA81, Amlod10, Losar100, Minipress5, Lasix40;  BP=144/80 & she denies CP, palpit, SOB, edema; [facility] HBP, CP, SOB; doing satis & no  changes made...    Cerebrovasc dis> on ASA81; she had f/u CDopplers 1/12 showing stable mild heterogeneous plaque in bulbs & 0-39% ICA stenoses (incidental 1cm right thyroid cyst).    VenInsuffic> on low sodium, elevation, support hose...    Chol> on Simva20; FLP shows TChol 152, TG 34, HDL 64, LDL 81    GI- HH, GERD, Divertics> on Prev30bid & probiotic daily; she denies abd pain, dysphagia, n/v, c/d, blood seen; known 4cHill[place]last colon 9/10 by DrStark w/ divertics only, but there is a FamHx colon ca in uncle, therefore f/u 5y52yr.    GU- IC, KidStones> on DDAVP & Elmiron100Tid; she is s/p DMSO treatments; states voiding is fine, denies recent leakage, incont, etc; last stone was 2007...    DJD, Osteopenia> on Norco5, Alendronate70/wk, calcium, MVI, VitD; she is s/p leftTHR by DrGioffre2004; last BMD 1/12 [facility]    HAs> she is s/p eval in HA clinics 1994 & 2003...    HxMelanoma> [facility] We reviewed prob list, meds, xrays and labs> see below for updates >> she had the 2013 Flu vaccine 10/13; she has requested refill prescriptions today... LABS 12/13:  FLP- at goals on Simva20;  Chems- wnl;  CBC- wnl;  TSH=2.43;  VitD=41   ~  May 18, 2013:  36mo68mo &  [name] has had a good interval- mild allergy symptoms in the spring, doing well overall & they cut back her allergy shots to every other wk; she still credits much of her improvement to Nopolea juice that she drinks daily for the last 84yr- "it's good for inflammation & helps my asthma"...     Asthma is stable on Symbicort80- taking one puff Bid & ProventilHFA vs NEB prn...     BP looks good on Amlod10, Minipress5Bid, Cozaar100, Lasix40; BP= 128/72 & she denies CP, palpit, dizzy, SOB, edema...    Chol has been well regulated on Simva20, RYR, CoQ10 w/ FLP 12/13 within parameters...    GI is stable on Prev30Bid & probiotic daily, followed by DrStark...    GU remains stable on Elmiron &  DDAVP; followed by DrPeterson & Fontaine... We reviewed prob list, meds, xrays and labs> see below for updates >>   ~  December 06, 2013:  6-779moOV & FrQuetzalas a number of questions today> notes freq fever blisters & wants rx (Acyclovir prn); wants Prevnar-13 (OK); wants CXR & BMD (ordered); has mole on her back & we will refer to Derm for excision; wonders about a CRP test (we discussed this); wants to change her PPI to Protonix (OK)... We reviewed the following medical problems during today's office visit >>     AR, Asthma, Bronchitis> on NEBS w/ Albut, Symbicort80, Allegra180; she has last Asthma exac 12/14- treated by TP w/ Depo/ Pred/ ZPak/ & incr Symbicort etc; she improved & ret to baseline but didn't bring meds to this OV...    HBP, HxCP> on ASA81, Amlod10, Losar100, Minipress5, Lasix40;  BP=138/72 & she denies CP, palpit, SOB, edema; [facility], CP, SOB; doing satis & no changes made...    Cerebrovasc dis> on ASA81; she had f/u CDopplers 4/14 showing stable mild heterogeneous plaque in bulbs & 0-39% ICA stenoses (incidental 1cm right thyroid cyst).    VenInsuffic> on low sodium, elevation, support hose...    Chol> on Simva20; FLP 2/15 shows TChol 164, TG 43, HDL 62, LDL 94    GI- HH, GERD, Divertics> on Prev30bid & probiotic daily; she denies abd pain, dysphagia, n/v, c/d, blood seen; known 4cFallstonlast colon 9/10 by DrStark w/ divertics only, but there is a FamHx colon ca in uncle, therefore f/u 5y66yr.    GU- IC, KidStones> on DDAVP & Elmiron100Tid; she is s/p DMSO treatments; states voiding is fine, denies recent leakage, incont, etc; last stone was 2007...    DJD, Osteopenia> on Norco5, Alendronate70/wk, calcium, MVI, VitD; she is s/p leftTHR by DrGioffre2004; last BMD 1/12 showed TScore -2.3 in [facility]    HAs> she is s/p eval in HA clinics 1994 & 2003...    HxMelanoma> [facility] We reviewed prob list, meds, xrays and labs> see  below for updates >>  CXR 2/15 showed norm heart size, chronic changes w/ incr markings and blunt right angle, NAD PFTs 2/15 showed FVC=2.38 (91%), FEV1=1.56 (79%), %1sec=65%, mid-flows=38% predicted... c/w GOLD Stage1 COPD BMD 2/15 showed TScores +0.3 in Spine and -1.8 in femNeck (improved on the Alendronate)... LABS 2/15:  FLP at goals on Simva20;  Chems- wnl;  CBC- wnl;  TSH= 2.55;  VitD= 48          Problem List:  ALLERGIC RHINITIS (ICD-477.9) ASTHMA (ICD-493.90) - she takes ALLEGRA 180m50m SYMBICORT 80- 1spBid, PROAIR & NEBS w/ Albuterol per DrESL; in addition she still gets weekly shots  from [facility], & he treats her w/ freq courses of Pred for exacerbations... ~  CXR 6/09 was clear...  ~  CXR 2/10 hosp showed mild peribronch thickening, NAD... ~  PFT 3/10 showed FVC=2.36 (90%), FEV1=1.78 (85%), %1sec=75, mid-flows= 57%pred. ~  4/10: she saw DrKozlow for second opinion & Rx of her AB & GERD... ~  CXR 2/11 showed clear lungs, NAD... f/u 7/11= same (clear, NAD).Marland Kitchen. ~  RAST testing 5/11 showed IgE= 130, & neg rast tests x cockroach +low titer. ~  CXR 7/11 showed mild cardiomeg, clear lungs, NAD. ~  Sinus CT 8/11 showed no signif inflamm dis, mild membrane thickening, min ethmoid changes on right. ~  PFT 8/11 showed FVC=2.04 (71%), FEV1=1.58 (73%), %1sec=78, mid-flows= 70%pred. ~  11/12:  She reports no recent infectious exac & doing better on a new supplement "nopalea" which she says reduces inflamm. ~  12/13: on NEBS w/ Albut, Symbicort80, Allegra180; she has last Asthma exac 8/13- treated by DrSharma w/ Depo/ Pred/ ZPak/ & incr Symbicort to 160; she improved & ret to baseline but didn't bring meds to this OV... ~  7/14: Asthma is stable on Symbicort80- taking one puff Bid & ProventilHFA vs NEB prn  BRONCHITIS, RECURRENT (ICD-491.9) - hx freq infectious exac... ~  CTChest 2008 w/ 58m nodule in RUL- unchanged over 674monterval, small HH, noncalcif pleural plaques  bilat... she is reassured w/ the recent CXR & serial films not showing nodule. ~  no recent resp infections but she likes to keep ZPak handy just in case.  HYPERTENSION (ICD-401.9) &  CHEST PAIN-UNSPECIFIED (ICD-786.50) - controlled on 4 meds:  NORVASC 1089m, LOSARTAN 100m10m MINIPRESS 5mgB64m & LASIX 40mg/71m ~  2DEcho 8/09 showed norm LV wall motion & EF= 60-65%, mild incr AoV thickness, mild LA dil & atrial septal aneurysm noted. ~  EKG 9/10 by DrKatz- NSR, WNL... ~  2DEcho 9/10 showed norm LV wall motion & thickness, EF= 60-65%, gr I DD noted, sl dil LA, right side OK...  ~  9/11:  BP= 130/84, and BP's at home are even better- 120's/ 60's per pt... denies HA, visual changes, CP, palipit, dizziness, syncope, edema, etc... ~  1/12:  She had f/u appt DrKatz- stable, no changes made... ~  5/12:  BP= 122/74 and stable on meds... ~  11/12:  BP= 126/74 & similar at home she says... ~  5/13:  She saw DrKatz for Cards f/u> EKG showed NSR, rate76, poor R progression, NAD; stable & doing well- no changes made... ~  6/13:  BP= 138/78 & she is feeling well; denies CP, palpit, SOB, edema, etc... ~  12/13:  on ASA81, Amlod10, Losar100, Minipress5, Lasix40;  BP=144/80 & she denies CP, palpit, SOB, edema; [facility] f/u HBP, CP, SOB; doing satis & no changes made... ~  7/14: BP looks good on Amlod10, Minipress5Bid, Cozaar100, Lasix40; BP= 128/72 & she denies CP, palpit, dizzy, SOB, edema.  CEREBROVASCULAR DISEASE (ICD-437.9) - on ASA 81mg/d49mshe had an abn lifeline screen 3/08 w/ left Carotid in the mild/mod range... ~  CDopplers 4/09 showed mild smooth heterogen plaque in bulbs, 0-39% ICA stenoses... ~  f/u CDoppler 1/12 by DrKatz Benay Spice mild heterogen plaque in bulbs, 0-39% bilat ICAstenoses... 1cm thyr cyst noted- not palpated.  VENOUS INSUFFICIENCY (ICD-459.81) - she knows to avoid sodium, elevate legs, wear support hose, continue the Lasix...  HYPERCHOLESTEROLEMIA  (ICD-272.0) - on SIMVASTATIN 20mg/d 54mQ10 daily... prev intol to statins w/ severe leg  cramps but the addition of the CoQ10 has made a world of difference to her tolerability... ~  [place] 3/08 on diet alone showed TChol 268, TG 35, HDL 96, LDL 166... ~  [place] 9/08 on Crestor 16m/d showed TChol 212, TG 37, HDL 73, LDL 127... continue Rx. ~  [place] showed TChol 179, TG 38, HDL 78, LDL 93... continue same Rx. ~  FLP 2/11 on Cres5 showed TChol 161, TG 42, HDL 70, LDL 83... pt switched to Simva20 per request. ~  FLucas9/11 on Simva20 showed TChol 209, TG 42, HDL 72, LDL 127... Same med, better diet, get wt down. ~  [place]11/12 on Simva20 showed TChol 168, TG 35, HDL 67, LDL 94 ~  FLP 12/13 on simva20 showed TChol 152, TG 34, HDL 64, LDL 81   GERD (ICD-530.81) - on PREVACID 393mid + max antireflux regimen Qhs. ~  last EGD 6/08 by DrSam showed 4cBruingastitis, and stricture- dilated... prev HPylori neg in 2007.  DIVERTICULOSIS OF COLON (ICD-562.10) ~  colonoscopy 3/05 by DrSam showed divertics only... f/u planned 5y62yr+fam hx in uncle). ~  colonoscopy 9/10 by DrStark showed divertics only... he plans f/u 28yr18yrINTERSTITIAL CYSTITIS (ICD-595.1) - eval and rx by DrPeterson... she has had DMSO in bladder + taking> ELMIRON 100mg27m & DDAVP 0.2mg-332msHS (off prev Vesicare)... last DMSO treatment ~5/11 per pt. ~  Note:  She is followed by DrFontaine for GYN...  RENAL CALCULUS, HX OF (ICD-V13.01) - required cysto & basket stone removal from right ureter in 2007...  DEGENERATIVE JOINT DISEASE (ICD-715.90) - she is s/p left THR by DrGioffre in 2004...  OSTEOPOROSIS (ICD-733.00) - on ALENDRONATE 70mg/w17marted 1/12; + Calcium, MVI, VitD; prev on Evista & this was stopped 6/13... ~  labs 12/09 showed Vit D level = 34... advised to start Vit D OTC 1-2,000 u daily... ~  BMD 1/12 showed TScores -0.1 in Spine, and -2.3 in [facility] FOSAMAX 70mg/wk75mng w/ her Calcium, MVI, VitD 5000u  (esp in light of her freq Pred use). ~  6/13:  Pt wanted to know if it was ok to stop her Evista since it is so expensive; she saw DrFontaine & he deferred to us- ok tKoreastop Evista. ~  BMD 2/15 showed TScores +0.3 in Spine and -1.8 in [facility] and rec to continue the Alendronate, calcium, MVI & VitD the same for now...  HEADACHE (ICD-784.0) - prev eval at the Headache clinic- 1994 by DrSpillman, and 2003 by DrFreeman...  Hx of MALIGNANT MELANOMA (ICD-172.9) - [facility]Hall [facility] from medial left knee area...  Health Maintenance - she gets the seasonal Flu vaccine yearly... given f/u PNEUMOVAX at age 28 in 2025...   Past Surgical History  Procedure Laterality Date  . Left total hip replacement  2004    Dr. 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 Hall  . [place]oscopy and basket stone removal right ureter  02/2006    Dr. PetersonTerance Hartiac catheterization    . Breast surgery  2013    Breast Bx-Benign  . Nasal septum surgery    . Colposcopy    . Gynecologic cryosurgery  1971    Outpatient Encounter Prescriptions as of 12/06/2013  Medication Sig  . albuterol (PROAIR HFA) 108 (90 BASE) MCG/ACT inhaler Inhale 2 puffs into the lungs every 6 (six) hours as needed for wheezing.  . albuteMarland Kitchenol (PROVENTIL) (2.5 MG/3ML) 0.083%  nebulizer solution Take 3 mLs (2.5 mg total) by nebulization every 6 (six) hours as needed.  Marland Kitchen alendronate (FOSAMAX) 70 MG tablet Take 1 tablet (70 mg total) by mouth every 7 (seven) days. Take with a full glass of water on an empty stomach.  Marland Kitchen amLODipine (NORVASC) 10 MG tablet Take 1 tablet (10 mg total) by mouth daily.  . Ascorbic Acid (VITAMIN C) 500 MG tablet Take 500 mg by mouth daily.    Marland Kitchen aspirin 81 MG tablet Take 81 mg by mouth daily.    . budesonide-formoterol (SYMBICORT) 80-4.5 MCG/ACT inhaler Inhale 1 puff into the lungs daily. Per Dr. Velora Heckler  . Calcium Carbonate (CALCIUM 600) 1500 MG TABS Take 1 tablet by mouth daily.     . Cholecalciferol (VITAMIN D3) 5000 UNITS CAPS Take 1 capsule by mouth daily.    . Coenzyme Q10 (COQ10) 100 MG CAPS Take 1 capsule by mouth daily.    Marland Kitchen desmopressin (DDAVP) 0.2 MG tablet Take 3 tablets by mouth daily at bedtime   . fexofenadine (ALLEGRA) 180 MG tablet Take 180 mg by mouth daily.    . furosemide (LASIX) 40 MG tablet Take 1 tablet (40 mg total) by mouth daily.  . lansoprazole (PREVACID) 30 MG capsule Take 1 capsule (30 mg total) by mouth 2 (two) times daily.  Marland Kitchen losartan (COZAAR) 100 MG tablet Take 1 tablet (100 mg total) by mouth daily.  . magnesium oxide (MAG-OX) 400 MG tablet Take 400 mg by mouth daily.    . Multiple Vitamin (MULTIVITAMIN) capsule Take 1 capsule by mouth daily.    . pentosan polysulfate (ELMIRON) 100 MG capsule Take 100 mg by mouth 3 (three) times daily before meals.    . prazosin (MINIPRESS) 5 MG capsule TAKE 1 CAPSULE (5 MG TOTAL) BY MOUTH 2 (TWO) TIMES DAILY.  Marland Kitchen PRESCRIPTION MEDICATION Allergy shots every other week  . Probiotic Product (PROBIOTIC PO) 1 daily  . simvastatin (ZOCOR) 20 MG tablet Take 1 tablet (20 mg total) by mouth at bedtime.  . [DISCONTINUED] azithromycin (ZITHROMAX) 250 MG tablet TAKE 2 TABLETS (500 MG) ON DAY 1, FOLLOWED BY 1 TABLET (250 MG) ONCE DAILY ON DAYS 2 THROUGH 5.  . [DISCONTINUED] predniSONE (DELTASONE) 10 MG tablet TAKE 4 TABS FOR 2 DAYS, THEN 3 TABS FOR 2 DAYS, 2 TABS FOR 2 DAYS, THEN 1 TAB FOR 2 DAYS, THEN STOP    Allergies  Allergen Reactions  . Cephalexin     REACTION: high fever  . Penicillins     REACTION: severe swelling and hives  . Phenobarbital     REACTION: hives    Review of Systems         See HPI - all other systems neg except as noted... The patient complains of dyspnea on exertion.  The patient denies anorexia, fever, weight loss, weight gain, vision loss, decreased hearing, hoarseness, chest pain, syncope, peripheral edema, prolonged cough, headaches, hemoptysis, abdominal pain, melena, hematochezia,  severe indigestion/heartburn, hematuria, incontinence, muscle weakness, suspicious skin lesions, transient blindness, difficulty walking, depression, unusual weight change, abnormal bleeding, enlarged lymph nodes, and angioedema.     Objective:   Physical Exam      WD, Overweight, 73 y/o WF in NAD... Vital Signs:  Reviewed... GENERAL:  Alert & oriented; pleasant & cooperative... HEENT:  [place]/AT, EOM-wnl, PERRLA, EACs-clear, TMs- some wax, NOSE-clear, THROAT- resolved, clear now. NECK:  Supple w/ fairROM; no JVD; normal carotid impulses w/o bruits; no thyromegaly or nodules palpated; no lymphadenopathy. CHEST:  essentially clear  now- without wheezing, rales, or signs of consolidation... min forced end-exp wheeze. HEART:  Regular Rhythm; without murmurs/ rubs/ or gallops heard... ABDOMEN:  Obese, soft & nontender; normal bowel sounds; no organomegaly or masses palpated... EXT: without deformities, mild arthritic changes; no varicose veins/ +venous insuffic/ tr edema. NEURO:  CN's intact;  no focal neuro deficits... DERM:  No lesions noted; no rash etc...  RADIOLOGY DATA:  Reviewed in the EPIC EMR & discussed w/ the patient...  LABORATORY DATA:  Reviewed in the EPIC EMR & discussed w/ the patient...   Assessment & Plan:    ASTHMA>  On regimen as above, she indicates doing well & only needed Pred once since 5/12 due to "Nopalea" supplement.Marland Kitchen  HBP>  Controlled on meds, continue same, get wt down...  Cerebrovasc dis> on ASA w/o cerebral ischemic symptoms & CDoppler 1/12 w/o acute change or progressive plaque etc...  CHOL> on Simva20, has added RYR she says, & FLP looks good...  GI>  Hx GERD stable on PPI Bid...  IC>  Followed by DrPeterson & stable by her report...  DJD/ OSteopenia>  Now on Fosamax along w/ her Calcium, MVI, Vit D...  Other medical problems as noted.Marland KitchenMarland Kitchen

## 2013-12-09 ENCOUNTER — Other Ambulatory Visit (INDEPENDENT_AMBULATORY_CARE_PROVIDER_SITE_OTHER): Payer: 59

## 2013-12-09 DIAGNOSIS — F419 Anxiety disorder, unspecified: Secondary | ICD-10-CM

## 2013-12-09 DIAGNOSIS — F411 Generalized anxiety disorder: Secondary | ICD-10-CM

## 2013-12-09 DIAGNOSIS — E7800 Pure hypercholesterolemia, unspecified: Secondary | ICD-10-CM

## 2013-12-09 DIAGNOSIS — K219 Gastro-esophageal reflux disease without esophagitis: Secondary | ICD-10-CM

## 2013-12-09 DIAGNOSIS — K5730 Diverticulosis of large intestine without perforation or abscess without bleeding: Secondary | ICD-10-CM

## 2013-12-09 DIAGNOSIS — I10 Essential (primary) hypertension: Secondary | ICD-10-CM

## 2013-12-09 DIAGNOSIS — M810 Age-related osteoporosis without current pathological fracture: Secondary | ICD-10-CM

## 2013-12-09 LAB — BASIC METABOLIC PANEL
BUN: 14 mg/dL (ref 6–23)
CHLORIDE: 108 meq/L (ref 96–112)
CO2: 28 mEq/L (ref 19–32)
Calcium: 9.4 mg/dL (ref 8.4–10.5)
Creatinine, Ser: 0.6 mg/dL (ref 0.4–1.2)
GFR: 96.4 mL/min (ref >60.00)
Glucose, Bld: 98 mg/dL (ref 70–99)
POTASSIUM: 4.3 meq/L (ref 3.5–5.1)
SODIUM: 140 meq/L (ref 135–145)

## 2013-12-09 LAB — TSH: TSH: 2.55 u[IU]/mL (ref 0.35–5.50)

## 2013-12-09 LAB — CBC WITH DIFFERENTIAL/PLATELET
BASOS ABS: 0 10*3/uL (ref 0.0–0.1)
Basophils Relative: 0.7 % (ref 0.0–3.0)
Eosinophils Absolute: 0.2 10*3/uL (ref 0.0–0.7)
Eosinophils Relative: 3.1 % (ref 0.0–5.0)
HEMATOCRIT: 38.5 % (ref 36.0–46.0)
Hemoglobin: 12.8 g/dL (ref 12.0–15.0)
LYMPHS ABS: 1.7 10*3/uL (ref 0.7–4.0)
Lymphocytes Relative: 31 % (ref 12.0–46.0)
MCHC: 33.3 g/dL (ref 30.0–36.0)
MCV: 87.4 fl (ref 78.0–100.0)
MONO ABS: 0.5 10*3/uL (ref 0.1–1.0)
Monocytes Relative: 8.3 % (ref 3.0–12.0)
NEUTROS PCT: 56.9 % (ref 43.0–77.0)
Neutro Abs: 3.2 10*3/uL (ref 1.4–7.7)
PLATELETS: 252 10*3/uL (ref 150.0–400.0)
RBC: 4.41 Mil/uL (ref 3.87–5.11)
RDW: 15 % — AB (ref 11.5–14.6)
WBC: 5.6 10*3/uL (ref 4.5–10.5)

## 2013-12-09 LAB — LIPID PANEL
CHOLESTEROL: 164 mg/dL (ref 0–200)
HDL: 61.9 mg/dL (ref >39.00)
LDL Cholesterol: 94 mg/dL (ref 0–99)
Total CHOL/HDL Ratio: 3
Triglycerides: 43 mg/dL (ref 0.0–149.0)
VLDL: 8.6 mg/dL (ref 0.0–40.0)

## 2013-12-09 LAB — HEPATIC FUNCTION PANEL
ALK PHOS: 45 U/L (ref 39–117)
ALT: 22 U/L (ref 0–35)
AST: 19 U/L (ref 0–37)
Albumin: 3.9 g/dL (ref 3.5–5.2)
Bilirubin, Direct: 0 mg/dL (ref 0.0–0.3)
Total Bilirubin: 0.6 mg/dL (ref 0.3–1.2)
Total Protein: 6.7 g/dL (ref 6.0–8.3)

## 2013-12-10 LAB — VITAMIN D 25 HYDROXY (VIT D DEFICIENCY, FRACTURES): VIT D 25 HYDROXY: 48 ng/mL (ref 30–89)

## 2013-12-14 ENCOUNTER — Ambulatory Visit (INDEPENDENT_AMBULATORY_CARE_PROVIDER_SITE_OTHER)
Admission: RE | Admit: 2013-12-14 | Discharge: 2013-12-14 | Disposition: A | Discharge status: Home / Self Care | Payer: 59 | Source: Ambulatory Visit | Attending: Pulmonary Disease | Admitting: Pulmonary Disease

## 2013-12-14 DIAGNOSIS — M810 Age-related osteoporosis without current pathological fracture: Secondary | ICD-10-CM

## 2013-12-15 ENCOUNTER — Telehealth: Payer: Self-pay | Admitting: Pulmonary Disease

## 2013-12-15 NOTE — Telephone Encounter (Signed)
Called and spoke with pt and she is aware of lab results per SN. Pt voiced her understanding and nothing further is needed 

## 2013-12-29 ENCOUNTER — Telehealth: Payer: Self-pay | Admitting: Pulmonary Disease

## 2013-12-29 MED ORDER — LOSARTAN POTASSIUM 100 MG PO TABS: 100.0000 mg | ORAL_TABLET | Freq: Every day | ORAL | Status: DC

## 2013-12-29 NOTE — Telephone Encounter (Signed)
Called and spoke w/ pt. She is aware will send in rx for losartan to [facility].  Also her symbicort needs a PA [phone]. I called # and was on hold x 12 min. wcb d/t # in triage

## 2013-12-31 MED ORDER — CICLESONIDE 160 MCG/ACT IN AERS: 2.0000 | INHALATION_SPRAY | Freq: Two times a day (BID) | RESPIRATORY_TRACT | Status: DC

## 2013-12-31 NOTE — Telephone Encounter (Signed)
Spoke with the pt and notified of recs per SN  She verbalized understanding  Nothing further needed Rx was sent to to [facility]

## 2013-12-31 NOTE — Telephone Encounter (Signed)
Spoke with the pt She states that other alternatives to symbicort are: flovent, asmanex, advair and alvesco  She has tried advair in the past and did not do well  Are any of these other meds appropriate? Please advise, thanks! Allergies  Allergen Reactions  . Cephalexin     REACTION: high fever  . Penicillins     REACTION: severe swelling and hives  . Phenobarbital     REACTION: hives

## 2013-12-31 NOTE — Telephone Encounter (Signed)
Called # provided. Was advised they were not able to pull pt up. Tried giving member ID # from last insurance card and was not able to pull pt either. Called pt and LMTCB x1

## 2013-12-31 NOTE — Telephone Encounter (Signed)
Per SN---  Ok to change to alvesco 160  2 puffs bid

## 2013-12-31 NOTE — Telephone Encounter (Signed)
Pt returning call.Stanley A Dalton ° °

## 2013-12-31 NOTE — Telephone Encounter (Signed)
LMTCB

## 2014-02-28 ENCOUNTER — Telehealth: Payer: Self-pay | Admitting: Pulmonary Disease

## 2014-02-28 MED ORDER — AMLODIPINE BESYLATE 10 MG PO TABS: 10.0000 mg | ORAL_TABLET | Freq: Every day | ORAL | Status: DC

## 2014-02-28 MED ORDER — PANTOPRAZOLE SODIUM 40 MG PO TBEC: 40.0000 mg | DELAYED_RELEASE_TABLET | Freq: Two times a day (BID) | ORAL | Status: DC

## 2014-02-28 NOTE — Telephone Encounter (Signed)
Spoke with the pt I notified her of SN's retirement from primary care and provided her with [facility]'s number  She verbalized understanding  Will let us know once she establishes Meds refilled  Nothing further needed per pt

## 2014-03-07 ENCOUNTER — Ambulatory Visit (INDEPENDENT_AMBULATORY_CARE_PROVIDER_SITE_OTHER): Payer: Commercial Managed Care - HMO | Admitting: Family

## 2014-03-07 ENCOUNTER — Encounter: Payer: Self-pay | Admitting: Family

## 2014-03-07 VITALS — BP 120/86 | HR 84 | Temp 98.7°F | Ht 62.0 in | Wt 198.0 lb

## 2014-03-07 DIAGNOSIS — J309 Allergic rhinitis, unspecified: Secondary | ICD-10-CM

## 2014-03-07 DIAGNOSIS — I10 Essential (primary) hypertension: Secondary | ICD-10-CM

## 2014-03-07 DIAGNOSIS — J45909 Unspecified asthma, uncomplicated: Secondary | ICD-10-CM

## 2014-03-07 DIAGNOSIS — K219 Gastro-esophageal reflux disease without esophagitis: Secondary | ICD-10-CM

## 2014-03-07 DIAGNOSIS — E7800 Pure hypercholesterolemia, unspecified: Secondary | ICD-10-CM

## 2014-03-07 MED ORDER — ESOMEPRAZOLE MAGNESIUM 40 MG PO CPDR: 40.0000 mg | DELAYED_RELEASE_CAPSULE | Freq: Every day | ORAL | Status: DC

## 2014-03-07 MED ORDER — AMLODIPINE BESYLATE 10 MG PO TABS: 10.0000 mg | ORAL_TABLET | Freq: Every day | ORAL | Status: DC

## 2014-03-07 NOTE — Progress Notes (Signed)
Pre visit review using our clinic review tool, if applicable. No additional management support is needed unless otherwise documented below in the visit note. 

## 2014-03-07 NOTE — Patient Instructions (Signed)

## 2014-03-08 NOTE — Progress Notes (Signed)
Subjective:    Patient ID: Brianna Mccarty, female    DOB: Aug 27, 1941, 73 y.o.   MRN: [id number]  HPI 73 year old white female, nonsmoker, new patient transferring from Dr. Lenna Gilford. She has a history of hypercholesterolemia, hypertension, allergic rhinitis, GERD, and asthma. GERD has been unstable on Protonix and would like to try a different medication. She is up to date on preventative care.    Review of Systems  Constitutional: Negative.   HENT: Negative.   Respiratory: Negative.   Cardiovascular: Negative.   Gastrointestinal: Negative.   Endocrine: Negative.   Genitourinary: Negative.   Musculoskeletal: Negative.   Skin: Negative.   Allergic/Immunologic: Negative.   Neurological: Negative.   Psychiatric/Behavioral: Negative.    Past Medical History  Diagnosis Date  . Allergic rhinitis   . Asthma   . Bronchitis, mucopurulent recurrent   . Hypertension   . Chest pain, unspecified   . Carotid artery disease     Doppler, April, 2009, 0-39% bilateral  . Venous insufficiency   . Hypercholesterolemia   . GERD (gastroesophageal reflux disease)   . Diverticulosis of colon   . Interstitial cystitis   . Renal calculus   . DJD (degenerative joint disease)   . Osteoporosis   . Headache(784.0)   . Malignant melanoma   . Lichen sclerosus   . Abnormal EKG     Normal LV function in the past  . Ejection fraction     EF 60%, echo, 2009  . Mitral valve disease     Question mitral valve prolapse in the past, no prolapse by echo 2009  . Thyroid cyst     1 x 1.1 thyroid cyst noted on carotid Doppler, January, 2012  . Cervical dysplasia 1971    History   Social History  . Marital Status: Married    Spouse Name: Edison Nasuti    Number of Children: 2  . Years of Education: N/A   Occupational History  . retired    Social History Main Topics  . Smoking status: Never Smoker   . Smokeless tobacco: Never Used  . Alcohol Use: 1.5 oz/week    3 drink(s) per week     Comment: social  use  . Drug Use: No  . Sexual Activity: No   Other Topics Concern  . Not on file   Social History Narrative  . No narrative on file    Past Surgical History  Procedure Laterality Date  . Left total hip replacement  2004    Dr. Gladstone Lighter  . Melanoma removed from medial rleft knee area  2006    Dr. [place] Crane  . Cystoscopy and basket stone removal right ureter  02/2006    Dr. Terance Hart  . Cardiac catheterization    . Breast surgery  2013    Breast Bx-Benign  . Nasal septum surgery    . Colposcopy    . Gynecologic cryosurgery  1971    Family History  Problem Relation Age of Onset  . Cancer Father     Pancreatic  . Diabetes Mother   . Heart disease Mother   . Cancer Brother     Bile duct  . Diabetes Brother   . Diabetes Brother   . Heart disease Brother   . Hypertension Brother   . Hypertension Sister   . Hypertension Sister     Allergies  Allergen Reactions  . Cephalexin     REACTION: high fever  . Penicillins     REACTION: severe swelling and  hives  . Phenobarbital     REACTION: hives    Current Outpatient Prescriptions on File Prior to Visit  Medication Sig Dispense Refill  . acyclovir (ZOVIRAX) 400 MG tablet Take 1 tablet (400 mg total) by mouth 2 (two) times daily.  60 tablet  6  . albuterol (PROAIR HFA) 108 (90 BASE) MCG/ACT inhaler Inhale 2 puffs into the lungs every 6 (six) hours as needed for wheezing.  3 Inhaler  3  . albuterol (PROVENTIL) (2.5 MG/3ML) 0.083% nebulizer solution Take 3 mLs (2.5 mg total) by nebulization every 6 (six) hours as needed.  360 mL  0  . alendronate (FOSAMAX) 70 MG tablet Take 1 tablet (70 mg total) by mouth every 7 (seven) days. Take with a full glass of water on an empty stomach.  12 tablet  3  . Ascorbic Acid (VITAMIN C) 500 MG tablet Take 500 mg by mouth daily.        Marland Kitchen aspirin 81 MG tablet Take 81 mg by mouth daily.        . budesonide-formoterol (SYMBICORT) 160-4.5 MCG/ACT inhaler Inhale 2 puffs into the lungs 2 (two) times  daily.  3 Inhaler  3  . Calcium Carbonate (CALCIUM 600) 1500 MG TABS Take 1 tablet by mouth daily.        . Cholecalciferol (VITAMIN D3) 5000 UNITS CAPS Take 1 capsule by mouth daily.        . ciclesonide (ALVESCO) 160 MCG/ACT inhaler Inhale 2 puffs into the lungs 2 (two) times daily.  1 Inhaler  11  . Coenzyme Q10 (COQ10) 100 MG CAPS Take 1 capsule by mouth daily.        Marland Kitchen desmopressin (DDAVP) 0.2 MG tablet Take 3 tablets by mouth daily at bedtime       . fexofenadine (ALLEGRA) 180 MG tablet Take 180 mg by mouth daily.        . furosemide (LASIX) 40 MG tablet Take 1 tablet (40 mg total) by mouth daily.  90 tablet  3  . losartan (COZAAR) 100 MG tablet Take 1 tablet (100 mg total) by mouth daily.  30 tablet  0  . magnesium oxide (MAG-OX) 400 MG tablet Take 400 mg by mouth daily.        . Multiple Vitamin (MULTIVITAMIN) capsule Take 1 capsule by mouth daily.        . pentosan polysulfate (ELMIRON) 100 MG capsule Take 100 mg by mouth 3 (three) times daily before meals.        . prazosin (MINIPRESS) 5 MG capsule TAKE 1 CAPSULE (5 MG TOTAL) BY MOUTH 2 (TWO) TIMES DAILY.  180 capsule  3  . PRESCRIPTION MEDICATION Allergy shots every other week      . Probiotic Product (PROBIOTIC PO) 1 daily      . simvastatin (ZOCOR) 20 MG tablet Take 1 tablet (20 mg total) by mouth at bedtime.  90 tablet  3   No current facility-administered medications on file prior to visit.    BP 120/86  Pulse 84  Temp(Src) 98.7 F (37.1 C) (Oral)  Ht 5\' 2"  (1.575 m)  Wt 198 lb (89.812 kg)  BMI 36.21 kg/m2chart    Objective:   Physical Exam  Constitutional: She is oriented to person, place, and time. She appears well-developed and well-nourished.  HENT:  Right Ear: External ear normal.  Left Ear: External ear normal.  Nose: Nose normal.  Mouth/Throat: Oropharynx is clear and moist.  Neck: Normal range of motion. Neck supple.  Cardiovascular: Normal rate, regular rhythm and normal heart sounds.   Pulmonary/Chest:  Effort normal and breath sounds normal.  Abdominal: Soft. Bowel sounds are normal.  Musculoskeletal: Normal range of motion.  Neurological: She is alert and oriented to person, place, and time.  Skin: Skin is warm.  Psychiatric: She has a normal mood and affect.          Assessment & Plan:  Tea was seen today for establish care.  Diagnoses and associated orders for this visit:  Unspecified essential hypertension  Allergic rhinitis  Pure hypercholesterolemia  Unspecified asthma(493.90)  GERD (gastroesophageal reflux disease)  Other Orders - amLODipine (NORVASC) 10 MG tablet; Take 1 tablet (10 mg total) by mouth daily. - esomeprazole (NEXIUM) 40 MG capsule; Take 1 capsule (40 mg total) by mouth daily.   Call the office with any question or concerns. Recheck in 6 months and sooner as needed.

## 2014-03-11 ENCOUNTER — Encounter: Payer: 59 | Admitting: Family

## 2014-03-11 ENCOUNTER — Telehealth: Payer: Self-pay | Admitting: Family

## 2014-03-11 DIAGNOSIS — I10 Essential (primary) hypertension: Secondary | ICD-10-CM

## 2014-03-11 NOTE — Telephone Encounter (Signed)
Pt needs referral to dr Herbert Deaner [phone] fax [phone] for vision field test on Monday 03-14-14. Pt has [employer]

## 2014-03-11 NOTE — Telephone Encounter (Signed)
Referral placed.

## 2014-04-08 ENCOUNTER — Telehealth: Payer: Self-pay | Admitting: Family

## 2014-04-08 MED ORDER — ESOMEPRAZOLE MAGNESIUM 40 MG PO CPDR: 40.0000 mg | DELAYED_RELEASE_CAPSULE | Freq: Every day | ORAL | Status: DC

## 2014-04-08 NOTE — Telephone Encounter (Signed)
We no longer have samples of Nexium

## 2014-04-08 NOTE — Telephone Encounter (Signed)
Pt is aware.  

## 2014-04-08 NOTE — Telephone Encounter (Signed)
Pt is needing new rx for esomeprazole (NEXIUM) 40 MG capsule, 90 day supply sent [facility].

## 2014-04-08 NOTE — Telephone Encounter (Signed)
Pt would like more samples of nexium 40 mg

## 2014-04-11 ENCOUNTER — Telehealth: Payer: Self-pay | Admitting: Pulmonary Disease

## 2014-04-11 ENCOUNTER — Telehealth: Payer: Self-pay | Admitting: Family

## 2014-04-11 MED ORDER — BUDESONIDE-FORMOTEROL FUMARATE 160-4.5 MCG/ACT IN AERO: 2.0000 | INHALATION_SPRAY | Freq: Two times a day (BID) | RESPIRATORY_TRACT | Status: DC

## 2014-04-11 NOTE — Telephone Encounter (Signed)
Called spoke with pt. Aware RX has been sent. Nothing further needed 

## 2014-04-11 NOTE — Telephone Encounter (Signed)
[facility] - [place], [place] - [address] is requesting 90 day re-fills on the following:  losartan (COZAAR) 100 MG tablet prazosin (MINIPRESS) 5 MG capsule esomeprazole (NEXIUM) 40 MG capsule

## 2014-04-12 MED ORDER — PRAZOSIN HCL 5 MG PO CAPS: ORAL_CAPSULE | ORAL | Status: DC

## 2014-04-12 MED ORDER — ESOMEPRAZOLE MAGNESIUM 40 MG PO CPDR: 40.0000 mg | DELAYED_RELEASE_CAPSULE | Freq: Every day | ORAL | Status: DC

## 2014-04-12 MED ORDER — LOSARTAN POTASSIUM 100 MG PO TABS: 100.0000 mg | ORAL_TABLET | Freq: Every day | ORAL | Status: DC

## 2014-04-12 NOTE — Telephone Encounter (Signed)
Done

## 2014-05-16 ENCOUNTER — Encounter: Payer: Self-pay | Admitting: Physician Assistant

## 2014-05-16 ENCOUNTER — Ambulatory Visit (INDEPENDENT_AMBULATORY_CARE_PROVIDER_SITE_OTHER): Payer: Commercial Managed Care - HMO | Admitting: Physician Assistant

## 2014-05-16 VITALS — BP 110/82 | HR 76 | Temp 98.8°F | Resp 18 | Wt 199.0 lb

## 2014-05-16 DIAGNOSIS — L739 Follicular disorder, unspecified: Secondary | ICD-10-CM

## 2014-05-16 DIAGNOSIS — L738 Other specified follicular disorders: Secondary | ICD-10-CM

## 2014-05-16 DIAGNOSIS — L678 Other hair color and hair shaft abnormalities: Secondary | ICD-10-CM

## 2014-05-16 NOTE — Progress Notes (Signed)
Subjective:    Patient ID: Brianna Mccarty, female    DOB: [birth year], 73 y.o.   MRN: [id number]  HPI Patient is a 73 y.o. female presenting for a head lump. Pt noticed lump on head about a week ago. Noticed after rubbing head. Was larger, and sore. Pt states that now it is much less sore, 1/10 pain. She has not noticed any drainage from the area. She states that she tried to mash a little on the area, but wasn't able to see any drainage. Pt has not tried anything to help this. She states it started to resolve on its own. She denies F/C/N/V/D/SOB/CP.    Review of Systems As per HPI and are otherwise negative.   Past Medical History  Diagnosis Date  . Allergic rhinitis   . Asthma   . Bronchitis, mucopurulent recurrent   . Hypertension   . Chest pain, unspecified   . Carotid artery disease     Doppler, April, 2009, 0-39% bilateral  . Venous insufficiency   . Hypercholesterolemia   . GERD (gastroesophageal reflux disease)   . Diverticulosis of colon   . Interstitial cystitis   . Renal calculus   . DJD (degenerative joint disease)   . Osteoporosis   . Headache(784.0)   . Malignant melanoma   . Lichen sclerosus   . Abnormal EKG     Normal LV function in the past  . Ejection fraction     EF 60%, echo, 2009  . Mitral valve disease     Question mitral valve prolapse in the past, no prolapse by echo 2009  . Thyroid cyst     1 x 1.1 thyroid cyst noted on carotid Doppler, January, 2012  . Cervical dysplasia 1971    History   Social History  . Marital Status: Married    Spouse Name: Edison Nasuti    Number of Children: 2  . Years of Education: N/A   Occupational History  . retired    Social History Main Topics  . Smoking status: Never Smoker   . Smokeless tobacco: Never Used  . Alcohol Use: 1.5 oz/week    3 drink(s) per week     Comment: social use  . Drug Use: No  . Sexual Activity: No   Other Topics Concern  . Not on file   Social History Narrative  . No narrative  on file    Past Surgical History  Procedure Laterality Date  . Left total hip replacement  2004    Dr. Gladstone Lighter  . Melanoma removed from medial rleft knee area  2006    Dr. [place] Crane  . Cystoscopy and basket stone removal right ureter  02/2006    Dr. Terance Hart  . Cardiac catheterization    . Breast surgery  2013    Breast Bx-Benign  . Nasal septum surgery    . Colposcopy    . Gynecologic cryosurgery  1971    Family History  Problem Relation Age of Onset  . Cancer Father     Pancreatic  . Diabetes Mother   . Heart disease Mother   . Cancer Brother     Bile duct  . Diabetes Brother   . Diabetes Brother   . Heart disease Brother   . Hypertension Brother   . Hypertension Sister   . Hypertension Sister     Allergies  Allergen Reactions  . Cephalexin     REACTION: high fever  . Penicillins     REACTION: severe swelling and  hives  . Phenobarbital     REACTION: hives    Current Outpatient Prescriptions on File Prior to Visit  Medication Sig Dispense Refill  . acyclovir (ZOVIRAX) 400 MG tablet Take 1 tablet (400 mg total) by mouth 2 (two) times daily.  60 tablet  6  . albuterol (PROAIR HFA) 108 (90 BASE) MCG/ACT inhaler Inhale 2 puffs into the lungs every 6 (six) hours as needed for wheezing.  3 Inhaler  3  . alendronate (FOSAMAX) 70 MG tablet Take 1 tablet (70 mg total) by mouth every 7 (seven) days. Take with a full glass of water on an empty stomach.  12 tablet  3  . amLODipine (NORVASC) 10 MG tablet Take 1 tablet (10 mg total) by mouth daily.  90 tablet  0  . Ascorbic Acid (VITAMIN C) 500 MG tablet Take 500 mg by mouth daily.        Marland Kitchen aspirin 81 MG tablet Take 81 mg by mouth daily.        . budesonide-formoterol (SYMBICORT) 160-4.5 MCG/ACT inhaler Inhale 2 puffs into the lungs 2 (two) times daily.  1 Inhaler  3  . Calcium Carbonate (CALCIUM 600) 1500 MG TABS Take 1 tablet by mouth daily.        . Cholecalciferol (VITAMIN D3) 5000 UNITS CAPS Take 1 capsule by mouth daily.         . ciclesonide (ALVESCO) 160 MCG/ACT inhaler Inhale 2 puffs into the lungs 2 (two) times daily.  1 Inhaler  11  . Coenzyme Q10 (COQ10) 100 MG CAPS Take 1 capsule by mouth daily.        Marland Kitchen desmopressin (DDAVP) 0.2 MG tablet Take 3 tablets by mouth daily at bedtime       . esomeprazole (NEXIUM) 40 MG capsule Take 1 capsule (40 mg total) by mouth daily.  90 capsule  0  . fexofenadine (ALLEGRA) 180 MG tablet Take 180 mg by mouth daily.        . furosemide (LASIX) 40 MG tablet Take 1 tablet (40 mg total) by mouth daily.  90 tablet  3  . losartan (COZAAR) 100 MG tablet Take 1 tablet (100 mg total) by mouth daily.  90 tablet  0  . magnesium oxide (MAG-OX) 400 MG tablet Take 400 mg by mouth daily.        . Multiple Vitamin (MULTIVITAMIN) capsule Take 1 capsule by mouth daily.        . pentosan polysulfate (ELMIRON) 100 MG capsule Take 100 mg by mouth 3 (three) times daily before meals.        . prazosin (MINIPRESS) 5 MG capsule TAKE 1 CAPSULE (5 MG TOTAL) BY MOUTH 2 (TWO) TIMES DAILY.  180 capsule  0  . PRESCRIPTION MEDICATION Allergy shots every other week      . Probiotic Product (PROBIOTIC PO) 1 daily      . simvastatin (ZOCOR) 20 MG tablet Take 1 tablet (20 mg total) by mouth at bedtime.  90 tablet  3  . albuterol (PROVENTIL) (2.5 MG/3ML) 0.083% nebulizer solution Take 3 mLs (2.5 mg total) by nebulization every 6 (six) hours as needed.  360 mL  0   No current facility-administered medications on file prior to visit.    EXAM: BP 110/82  Pulse 76  Temp(Src) 98.8 F (37.1 C) (Oral)  Resp 18  Wt 199 lb (90.266 kg)  SpO2 96%     Objective:   Physical Exam  Nursing note and vitals reviewed. Constitutional: She  is oriented to person, place, and time. She appears well-developed and well-nourished. No distress.  HENT:  Head: Normocephalic and atraumatic.  Eyes: Conjunctivae and EOM are normal. Pupils are equal, round, and reactive to light.  Neck: Normal range of motion.  Cardiovascular:  Normal rate, regular rhythm and intact distal pulses.   Pulmonary/Chest: Effort normal and breath sounds normal. No respiratory distress. She exhibits no tenderness.  Musculoskeletal: Normal range of motion.  Neurological: She is alert and oriented to person, place, and time.  Skin: Skin is warm and dry. No rash noted. She is not diaphoretic. There is erythema. No pallor.  Scalp, right side: There is a small, subcentimeter area of erythema. No surrounding erythema or swelling, this is non-ttp, no fluctuance or excessive warmth. There is a centralized hair follicle with some old inflammatory changes, non-expressible. No sign of injury.  Psychiatric: She has a normal mood and affect. Her behavior is normal. Judgment and thought content normal.    Lab Results  Component Value Date   WBC 5.6 12/09/2013   HGB 12.8 12/09/2013   HCT 38.5 12/09/2013   PLT 252.0 12/09/2013   GLUCOSE 98 12/09/2013   CHOL 164 12/09/2013   TRIG 43.0 12/09/2013   HDL 61.90 12/09/2013   LDLDIRECT 126.6 07/18/2010   LDLCALC 94 12/09/2013   ALT 22 12/09/2013   AST 19 12/09/2013   NA 140 12/09/2013   K 4.3 12/09/2013   CL 108 12/09/2013   CREATININE 0.6 12/09/2013   BUN 14 12/09/2013   CO2 28 12/09/2013   TSH 2.55 12/09/2013         Assessment & Plan:  Gracielynn was seen today for lump on head.  Diagnoses and associated orders for this visit:  Folliculitis Comments: Resolving on it own. No treatment needed, will have pt continue to monitor and return for worsening.    Pt will continue to watch the area for improvement.  Return precautions provided, and patient handout on folliculitis.  Plan to follow up as needed, or for worsening or persistent symptoms despite treatment.  Patient Instructions  Continue to monitor the area for improvement.  If emergency symptoms discussed during visit developed, seek medical attention immediately.  Followup as needed, or for worsening or persistent symptoms despite treatment.

## 2014-05-16 NOTE — Patient Instructions (Signed)
Continue to monitor the area for improvement.  If emergency symptoms discussed during visit developed, seek medical attention immediately.  Followup as needed, or for worsening or persistent symptoms despite treatment.    Folliculitis  Folliculitis is redness, soreness, and swelling (inflammation) of the hair follicles. This condition can occur anywhere on the body. People with weakened immune systems, diabetes, or obesity have a greater risk of getting folliculitis. CAUSES  Bacterial infection. This is the most common cause.  Fungal infection.  Viral infection.  Contact with certain chemicals, especially oils and tars. Long-term folliculitis can result from bacteria that live in the nostrils. The bacteria may trigger multiple outbreaks of folliculitis over time. SYMPTOMS Folliculitis most commonly occurs on the scalp, thighs, legs, back, buttocks, and areas where hair is shaved frequently. An early sign of folliculitis is a small, white or yellow, pus-filled, itchy lesion (pustule). These lesions appear on a red, inflamed follicle. They are usually less than 0.2 inches (5 mm) wide. When there is an infection of the follicle that goes deeper, it becomes a boil or furuncle. A group of closely packed boils creates a larger lesion (carbuncle). Carbuncles tend to occur in hairy, sweaty areas of the body. DIAGNOSIS  Your caregiver can usually tell what is wrong by doing a physical exam. A sample may be taken from one of the lesions and tested in a lab. This can help determine what is causing your folliculitis. TREATMENT  Treatment may include:  Applying warm compresses to the affected areas.  Taking antibiotic medicines orally or applying them to the skin.  Draining the lesions if they contain a large amount of pus or fluid.  Laser hair removal for cases of long-lasting folliculitis. This helps to prevent regrowth of the hair. HOME CARE INSTRUCTIONS  Apply warm compresses to the  affected areas as directed by your caregiver.  If antibiotics are prescribed, take them as directed. Finish them even if you start to feel better.  You may take over-the-counter medicines to relieve itching.  Do not shave irritated skin.  Follow up with your caregiver as directed. SEEK IMMEDIATE MEDICAL CARE IF:   You have increasing redness, swelling, or pain in the affected area.  You have a fever. MAKE SURE YOU:  Understand these instructions.  Will watch your condition.  Will get help right away if you are not doing well or get worse. Document Released: 12/30/2001 Document Revised: 04/21/2012 Document Reviewed: 01/21/2012 [facility] Patient Information 2015 [place], [place]. This information is not intended to replace advice given to you by your health care provider. Make sure you discuss any questions you have with your health care provider.

## 2014-05-20 ENCOUNTER — Encounter: Payer: Self-pay | Admitting: Gastroenterology

## 2014-06-01 ENCOUNTER — Other Ambulatory Visit: Payer: Self-pay | Admitting: Family

## 2014-06-01 ENCOUNTER — Ambulatory Visit (INDEPENDENT_AMBULATORY_CARE_PROVIDER_SITE_OTHER): Payer: Commercial Managed Care - HMO | Admitting: Gynecology

## 2014-06-01 ENCOUNTER — Encounter: Payer: Self-pay | Admitting: Gynecology

## 2014-06-01 VITALS — BP 124/76 | Ht 62.0 in | Wt 197.0 lb

## 2014-06-01 DIAGNOSIS — L900 Lichen sclerosus et atrophicus: Secondary | ICD-10-CM

## 2014-06-01 DIAGNOSIS — N952 Postmenopausal atrophic vaginitis: Secondary | ICD-10-CM

## 2014-06-01 DIAGNOSIS — L940 Localized scleroderma [morphea]: Secondary | ICD-10-CM

## 2014-06-01 DIAGNOSIS — M810 Age-related osteoporosis without current pathological fracture: Secondary | ICD-10-CM

## 2014-06-01 DIAGNOSIS — N393 Stress incontinence (female) (male): Secondary | ICD-10-CM

## 2014-06-01 NOTE — Progress Notes (Signed)
Brianna Mccarty 74/01/1941 [id number]        73 y.o.  [id number] for followup exam. Several issues noted below.  Past medical history,surgical history, problem list, medications, allergies, family history and social history were all reviewed and documented as reviewed in the EPIC chart.  ROS:  12 system ROS performed with pertinent positives and negatives included in the history, assessment and plan.   Additional significant findings :  None   Exam: [name] Vitals:   06/01/14 1527  BP: 124/76  Height: 5\' 2"  (1.575 m)  Weight: 197 lb (89.359 kg)   General appearance:  Normal affect, orientation and appearance. Skin: Grossly normal HEENT: Without gross lesions.  No cervical or supraclavicular adenopathy. Thyroid normal.  Lungs:  Clear without wheezing, rales or rhonchi Cardiac: RR, without RMG Abdominal:  Soft, nontender, without masses, guarding, rebound, organomegaly or hernia Breasts:  Examined lying and sitting without masses, retractions, discharge or axillary adenopathy. Pelvic:  Ext/BUS/vagina with generalized atrophic changes. Mild whitish skin changes lateral to posterior fourchette consistent with her past history of lichen sclerosis.  Cervix atrophic  Uterus anteverted axial, normal size, shape and contour, midline and mobile nontender   Adnexa  Without masses or tenderness    Anus and perineum  Normal   Rectovaginal  Normal sphincter tone without palpated masses or tenderness.    Assessment/Plan:  73 y.o. [id number] female for followup exam.   1. Lichen sclerosis. Patient with minimal symptoms. Occasionally uses Temovate and has a supply at home. Will call if she needs more. 2. Postmenopausal/atrophic genital changes. Patient without significant symptoms of hot flushes, night sweats, vaginal dryness. No vaginal bleeding. Will continue to monitor. Call if any vaginal bleeding. 3. Osteoporosis. Currently followed at [facility] on Fosamax. Had recent DEXA with T score  -1.8. I asked her the next time she sees her physician to discuss whether they would consider a drug-free holiday. The risk of long-term use to include atypical fractures was discussed with her. 4. Urinary incontinence. Patient does have issues with loss of urine with a SUI pattern. Options reviewed. Patient prefers to monitor at present 5. Pap smear 2012. No Pap smear done today. History of cryosurgery 1971 with normal Pap smears afterwards. We previously discussed current screening guidelines and both agree to stop screening issues over the age of 74 and she is comfortable with this. 6. Mammography 11/2013. Continue annual mammography. SBE monthly reviewed. 7. Colonoscopy 2010. Repeat at their recommended interval. 8. Health maintenance. No routine blood work done and she has this done at her primary physician's office. Followup one year, sooner as needed.   Note: This document was prepared with digital dictation and possible smart phrase technology. Any transcriptional errors that result from this process are unintentional.   Anastasio Auerbach MD, 4:11 PM 06/01/2014

## 2014-06-01 NOTE — Patient Instructions (Signed)
Followup in one year, sooner as needed. Discuss possibly stopping her Fosamax with your doctor at [facility].  You may obtain a copy of any labs that were done today by logging onto MyChart as outlined in the instructions provided with your AVS (after visit summary). The office will not call with normal lab results but certainly if there are any significant abnormalities then we will contact you.   Health Maintenance, Female A healthy lifestyle and preventative care can promote health and wellness.  Maintain regular health, dental, and eye exams.  Eat a healthy diet. Foods like vegetables, fruits, whole grains, low-fat dairy products, and lean protein foods contain the nutrients you need without too many calories. Decrease your intake of foods high in solid fats, added sugars, and salt. Get information about a proper diet from your caregiver, if necessary.  Regular physical exercise is one of the most important things you can do for your health. Most adults should get at least 150 minutes of moderate-intensity exercise (any activity that increases your heart rate and causes you to sweat) each week. In addition, most adults need muscle-strengthening exercises on 2 or more days a week.   Maintain a healthy weight. The body mass index (BMI) is a screening tool to identify possible weight problems. It provides an estimate of body fat based on height and weight. Your caregiver can help determine your BMI, and can help you achieve or maintain a healthy weight. For adults 20 years and older:  A BMI below 18.5 is considered underweight.  A BMI of 18.5 to 24.9 is normal.  A BMI of 25 to 29.9 is considered overweight.  A BMI of 30 and above is considered obese.  Maintain normal blood lipids and cholesterol by exercising and minimizing your intake of saturated fat. Eat a balanced diet with plenty of fruits and vegetables. Blood tests for lipids and cholesterol should begin at age 7 and be  repeated every 5 years. If your lipid or cholesterol levels are high, you are over 50, or you are a high risk for heart disease, you may need your cholesterol levels checked more frequently.Ongoing high lipid and cholesterol levels should be treated with medicines if diet and exercise are not effective.  If you smoke, find out from your caregiver how to quit. If you do not use tobacco, do not start.  Lung cancer screening is recommended for adults aged 45 80 years who are at high risk for developing lung cancer because of a history of smoking. Yearly low-dose computed tomography (CT) is recommended for people who have at least a 30-pack-year history of smoking and are a current smoker or have quit within the past 15 years. A pack year of smoking is smoking an average of 1 pack of cigarettes a day for 1 year (for example: 1 pack a day for 30 years or 2 packs a day for 15 years). Yearly screening should continue until the smoker has stopped smoking for at least 15 years. Yearly screening should also be stopped for people who develop a health problem that would prevent them from having lung cancer treatment.  If you are pregnant, do not drink alcohol. If you are breastfeeding, be very cautious about drinking alcohol. If you are not pregnant and choose to drink alcohol, do not exceed 1 drink per day. One drink is considered to be 12 ounces (355 mL) of beer, 5 ounces (148 mL) of wine, or 1.5 ounces (44 mL) of liquor.  Avoid use of  street drugs. Do not share needles with anyone. Ask for help if you need support or instructions about stopping the use of drugs.  High blood pressure causes heart disease and increases the risk of stroke. Blood pressure should be checked at least every 1 to 2 years. Ongoing high blood pressure should be treated with medicines, if weight loss and exercise are not effective.  If you are 14 to 74 years old, ask your caregiver if you should take aspirin to prevent strokes.  Diabetes  screening involves taking a blood sample to check your fasting blood sugar level. This should be done once every 3 years, after age 55, if you are within normal weight and without risk factors for diabetes. Testing should be considered at a younger age or be carried out more frequently if you are overweight and have at least 1 risk factor for diabetes.  Breast cancer screening is essential preventative care for women. You should practice "breast self-awareness." This means understanding the normal appearance and feel of your breasts and may include breast self-examination. Any changes detected, no matter how small, should be reported to a caregiver. Women in their 56s and 30s should have a clinical breast exam (CBE) by a caregiver as part of a regular health exam every 1 to 3 years. After age 76, women should have a CBE every year. Starting at age 73, women should consider having a mammogram (breast X-ray) every year. Women who have a family history of breast cancer should talk to their caregiver about genetic screening. Women at a high risk of breast cancer should talk to their caregiver about having an MRI and a mammogram every year.  Breast cancer gene (BRCA)-related cancer risk assessment is recommended for women who have family members with BRCA-related cancers. BRCA-related cancers include breast, ovarian, tubal, and peritoneal cancers. Having family members with these cancers may be associated with an increased risk for harmful changes (mutations) in the breast cancer genes BRCA1 and BRCA2. Results of the assessment will determine the need for genetic counseling and BRCA1 and BRCA2 testing.  The Pap test is a screening test for cervical cancer. Women should have a Pap test starting at age 56. Between ages 77 and 38, Pap tests should be repeated every 2 years. Beginning at age 45, you should have a Pap test every 3 years as long as the past 3 Pap tests have been normal. If you had a hysterectomy for a  problem that was not cancer or a condition that could lead to cancer, then you no longer need Pap tests. If you are between ages 52 and 17, and you have had normal Pap tests going back 10 years, you no longer need Pap tests. If you have had past treatment for cervical cancer or a condition that could lead to cancer, you need Pap tests and screening for cancer for at least 20 years after your treatment. If Pap tests have been discontinued, risk factors (such as a new sexual partner) need to be reassessed to determine if screening should be resumed. Some women have medical problems that increase the chance of getting cervical cancer. In these cases, your caregiver may recommend more frequent screening and Pap tests.  The human papillomavirus (HPV) test is an additional test that may be used for cervical cancer screening. The HPV test looks for the virus that can cause the cell changes on the cervix. The cells collected during the Pap test can be tested for HPV. The HPV test could  be used to screen women aged 69 years and older, and should be used in women of any age who have unclear Pap test results. After the age of 27, women should have HPV testing at the same frequency as a Pap test.  Colorectal cancer can be detected and often prevented. Most routine colorectal cancer screening begins at the age of 1 and continues through age 69. However, your caregiver may recommend screening at an earlier age if you have risk factors for colon cancer. On a yearly basis, your caregiver may provide home test kits to check for hidden blood in the stool. Use of a small camera at the end of a tube, to directly examine the colon (sigmoidoscopy or colonoscopy), can detect the earliest forms of colorectal cancer. Talk to your caregiver about this at age 80, when routine screening begins. Direct examination of the colon should be repeated every 5 to 10 years through age 27, unless early forms of pre-cancerous polyps or small growths  are found.  Hepatitis C blood testing is recommended for all people born from [birth year] through 1966 and any individual with known risks for hepatitis C.  Practice safe sex. Use condoms and avoid high-risk sexual practices to reduce the spread of sexually transmitted infections (STIs). Sexually active women aged 24 and younger should be checked for Chlamydia, which is a common sexually transmitted infection. Older women with new or multiple partners should also be tested for Chlamydia. Testing for other STIs is recommended if you are sexually active and at increased risk.  Osteoporosis is a disease in which the bones lose minerals and strength with aging. This can result in serious bone fractures. The risk of osteoporosis can be identified using a bone density scan. Women ages 73 and over and women at risk for fractures or osteoporosis should discuss screening with their caregivers. Ask your caregiver whether you should be taking a calcium supplement or vitamin D to reduce the rate of osteoporosis.  Menopause can be associated with physical symptoms and risks. Hormone replacement therapy is available to decrease symptoms and risks. You should talk to your caregiver about whether hormone replacement therapy is right for you.  Use sunscreen. Apply sunscreen liberally and repeatedly throughout the day. You should seek shade when your shadow is shorter than you. Protect yourself by wearing long sleeves, pants, a wide-brimmed hat, and sunglasses year round, whenever you are outdoors.  Notify your caregiver of new moles or changes in moles, especially if there is a change in shape or color. Also notify your caregiver if a mole is larger than the size of a pencil eraser.  Stay current with your immunizations. Document Released: 05/06/2011 Document Revised: 02/15/2013 Document Reviewed: 05/06/2011 [facility] Patient Information 2014 [place].

## 2014-06-02 LAB — URINALYSIS W MICROSCOPIC + REFLEX CULTURE
BACTERIA UA: NONE SEEN
Bilirubin Urine: NEGATIVE
Casts: NONE SEEN
Crystals: NONE SEEN
Glucose, UA: NEGATIVE mg/dL
Hgb urine dipstick: NEGATIVE
KETONES UR: NEGATIVE mg/dL
Leukocytes, UA: NEGATIVE
NITRITE: NEGATIVE
Protein, ur: NEGATIVE mg/dL
Specific Gravity, Urine: 1.009 (ref 1.005–1.030)
Squamous Epithelial / LPF: NONE SEEN
UROBILINOGEN UA: 0.2 mg/dL (ref 0.0–1.0)
pH: 6 (ref 5.0–8.0)

## 2014-06-06 ENCOUNTER — Ambulatory Visit: Payer: 59 | Admitting: Pulmonary Disease

## 2014-06-15 ENCOUNTER — Ambulatory Visit (INDEPENDENT_AMBULATORY_CARE_PROVIDER_SITE_OTHER): Payer: Commercial Managed Care - HMO | Admitting: Family

## 2014-06-15 ENCOUNTER — Telehealth: Payer: Self-pay | Admitting: Family

## 2014-06-15 ENCOUNTER — Encounter: Payer: Self-pay | Admitting: Family

## 2014-06-15 VITALS — BP 120/70 | HR 103 | Ht 62.0 in | Wt 200.0 lb

## 2014-06-15 DIAGNOSIS — E7800 Pure hypercholesterolemia, unspecified: Secondary | ICD-10-CM

## 2014-06-15 DIAGNOSIS — L723 Sebaceous cyst: Secondary | ICD-10-CM

## 2014-06-15 DIAGNOSIS — I10 Essential (primary) hypertension: Secondary | ICD-10-CM

## 2014-06-15 NOTE — Progress Notes (Signed)
Pre visit review using our clinic review tool, if applicable. No additional management support is needed unless otherwise documented below in the visit note. 

## 2014-06-15 NOTE — Progress Notes (Signed)
Subjective:    Patient ID: Brianna Mccarty, female    DOB: [birth year], 73 y.o.   MRN: [id number]  HPI 73 year old white female, nonsmoker is in today with a persistent lump to the right scalp. Was seen by Kela Millin who diagnosed folliculitis and suggested warm compresses. Symptoms have persisted. Describes it as red, tender and sore. No drainage or discharge.  Patient also has a history of stable hypertension and hypercholesterolemia. Take medications as prescribed and tolerates them well.   Review of Systems  Constitutional: Negative.   Respiratory: Negative.   Cardiovascular: Negative.   Skin:       Tender, red mass to right scalp  Psychiatric/Behavioral: Negative.   All other systems reviewed and are negative.  Past Medical History  Diagnosis Date  . Allergic rhinitis   . Asthma   . Bronchitis, mucopurulent recurrent   . Hypertension   . Chest pain, unspecified   . Carotid artery disease     Doppler, April, 2009, 0-39% bilateral  . Venous insufficiency   . Hypercholesterolemia   . GERD (gastroesophageal reflux disease)   . Diverticulosis of colon   . Interstitial cystitis   . Renal calculus   . DJD (degenerative joint disease)   . Osteoporosis   . Headache(784.0)   . Malignant melanoma   . Lichen sclerosus   . Abnormal EKG     Normal LV function in the past  . Ejection fraction     EF 60%, echo, 2009  . Mitral valve disease     Question mitral valve prolapse in the past, no prolapse by echo 2009  . Thyroid cyst     1 x 1.1 thyroid cyst noted on carotid Doppler, January, 2012  . Cervical dysplasia 1971    History   Social History  . Marital Status: Married    Spouse Name: Edison Nasuti    Number of Children: 2  . Years of Education: N/A   Occupational History  . retired    Social History Main Topics  . Smoking status: Never Smoker   . Smokeless tobacco: Never Used  . Alcohol Use: 1.5 oz/week    3 drink(s) per week     Comment: social use  . Drug  Use: No  . Sexual Activity: No   Other Topics Concern  . Not on file   Social History Narrative  . No narrative on file    Past Surgical History  Procedure Laterality Date  . Left total hip replacement  2004    Dr. Gladstone Lighter  . Melanoma removed from medial rleft knee area  2006    Dr. [place] Crane  . Cystoscopy and basket stone removal right ureter  02/2006    Dr. Terance Hart  . Cardiac catheterization    . Breast surgery  2013    Breast Bx-Benign  . Nasal septum surgery    . Gynecologic cryosurgery  1971    Family History  Problem Relation Age of Onset  . Cancer Father     Pancreatic  . Diabetes Mother   . Heart disease Mother   . Cancer Brother     Bile duct  . Diabetes Brother   . Hypertension Brother   . Diabetes Brother   . Heart disease Brother   . Hypertension Brother   . Hypertension Sister   . Hypertension Sister     Allergies  Allergen Reactions  . Cephalexin     REACTION: high fever  . Penicillins     REACTION: severe  swelling and hives  . Phenobarbital     REACTION: hives    Current Outpatient Prescriptions on File Prior to Visit  Medication Sig Dispense Refill  . acyclovir (ZOVIRAX) 400 MG tablet Take 1 tablet (400 mg total) by mouth 2 (two) times daily.  60 tablet  6  . albuterol (PROAIR HFA) 108 (90 BASE) MCG/ACT inhaler Inhale 2 puffs into the lungs every 6 (six) hours as needed for wheezing.  3 Inhaler  3  . alendronate (FOSAMAX) 70 MG tablet Take 1 tablet (70 mg total) by mouth every 7 (seven) days. Take with a full glass of water on an empty stomach.  12 tablet  3  . amLODipine (NORVASC) 10 MG tablet TAKE 1 TABLET (10 MG TOTAL) BY MOUTH DAILY.  90 tablet  0  . Ascorbic Acid (VITAMIN C) 500 MG tablet Take 500 mg by mouth daily.        Marland Kitchen aspirin 81 MG tablet Take 81 mg by mouth daily.        . budesonide-formoterol (SYMBICORT) 160-4.5 MCG/ACT inhaler Inhale 2 puffs into the lungs 2 (two) times daily.  1 Inhaler  3  . Calcium Carbonate (CALCIUM 600) 1500  MG TABS Take 1 tablet by mouth daily.        . Cholecalciferol (VITAMIN D3) 5000 UNITS CAPS Take 1 capsule by mouth daily.        . Coenzyme Q10 (COQ10) 100 MG CAPS Take 1 capsule by mouth daily.        Marland Kitchen desmopressin (DDAVP) 0.2 MG tablet Take 3 tablets by mouth daily at bedtime       . esomeprazole (NEXIUM) 40 MG capsule Take 1 capsule (40 mg total) by mouth daily.  90 capsule  0  . fexofenadine (ALLEGRA) 180 MG tablet Take 180 mg by mouth daily.        . furosemide (LASIX) 40 MG tablet Take 1 tablet (40 mg total) by mouth daily.  90 tablet  3  . losartan (COZAAR) 100 MG tablet Take 1 tablet (100 mg total) by mouth daily.  90 tablet  0  . magnesium oxide (MAG-OX) 400 MG tablet Take 400 mg by mouth daily.        . Multiple Vitamin (MULTIVITAMIN) capsule Take 1 capsule by mouth daily.        . pentosan polysulfate (ELMIRON) 100 MG capsule Take 100 mg by mouth 3 (three) times daily before meals.        . prazosin (MINIPRESS) 5 MG capsule TAKE 1 CAPSULE (5 MG TOTAL) BY MOUTH 2 (TWO) TIMES DAILY.  180 capsule  0  . PRESCRIPTION MEDICATION Allergy shots every other week      . Probiotic Product (PROBIOTIC PO) 1 daily      . simvastatin (ZOCOR) 20 MG tablet Take 1 tablet (20 mg total) by mouth at bedtime.  90 tablet  3  . albuterol (PROVENTIL) (2.5 MG/3ML) 0.083% nebulizer solution Take 3 mLs (2.5 mg total) by nebulization every 6 (six) hours as needed.  360 mL  0   No current facility-administered medications on file prior to visit.    BP 120/70  Pulse 103  Ht 5\' 2"  (1.575 m)  Wt 200 lb (90.719 kg)  BMI 36.57 kg/m2chart    Objective:   Physical Exam  Constitutional: She is oriented to person, place, and time. She appears well-developed and well-nourished.  HENT:  Head:    Neck: Normal range of motion. Neck supple.  Cardiovascular: Normal rate,  regular rhythm and normal heart sounds.   Pulmonary/Chest: Breath sounds normal.  Abdominal: Soft. Bowel sounds are normal.  Musculoskeletal:  Normal range of motion.  Neurological: She is alert and oriented to person, place, and time.  Skin: Skin is warm and dry.  Psychiatric: She has a normal mood and affect.      Informed consent obtained. The site was prepped with Betadine and using a 11 blade a .25 cm incision was made to the right scalp.  Hemostasis was achieved with a compression. Wound care was discussed with the patien   Assessment & Plan:  Brianna Mccarty was seen today for follow-up.  Diagnoses and associated orders for this visit:  Sebaceous cyst  Unspecified essential hypertension  Pure hypercholesterolemia   Call the office with any questions or concerns. Recheck as scheduled and as needed.

## 2014-06-15 NOTE — Telephone Encounter (Signed)
Relevant patient education assigned to patient using Emmi. ° °

## 2014-06-15 NOTE — Patient Instructions (Signed)
Epidermal Cyst An epidermal cyst is sometimes called a sebaceous cyst, epidermal inclusion cyst, or infundibular cyst. These cysts usually contain a substance that looks "pasty" or "cheesy" and may have a bad smell. This substance is a protein called keratin. Epidermal cysts are usually found on the face, neck, or trunk. They may also occur in the vaginal area or other parts of the genitalia of both men and women. Epidermal cysts are usually small, painless, slow-growing bumps or lumps that move freely under the skin. It is important not to try to pop them. This may cause an infection and lead to tenderness and swelling. CAUSES  Epidermal cysts may be caused by a deep penetrating injury to the skin or a plugged hair follicle, often associated with acne. SYMPTOMS  Epidermal cysts can become inflamed and cause:  Redness.  Tenderness.  Increased temperature of the skin over the bumps or lumps.  Grayish-white, bad smelling material that drains from the bump or lump. DIAGNOSIS  Epidermal cysts are easily diagnosed by your caregiver during an exam. Rarely, a tissue sample (biopsy) may be taken to rule out other conditions that may resemble epidermal cysts. TREATMENT   Epidermal cysts often get better and disappear on their own. They are rarely ever cancerous.  If a cyst becomes infected, it may become inflamed and tender. This may require opening and draining the cyst. Treatment with antibiotics may be necessary. When the infection is gone, the cyst may be removed with minor surgery.  Small, inflamed cysts can often be treated with antibiotics or by injecting steroid medicines.  Sometimes, epidermal cysts become large and bothersome. If this happens, surgical removal in your caregiver's office may be necessary. HOME CARE INSTRUCTIONS  Only take over-the-counter or prescription medicines as directed by your caregiver.  Take your antibiotics as directed. Finish them even if you start to feel  better. SEEK MEDICAL CARE IF:   Your cyst becomes tender, red, or swollen.  Your condition is not improving or is getting worse.  You have any other questions or concerns. MAKE SURE YOU:  Understand these instructions.  Will watch your condition.  Will get help right away if you are not doing well or get worse. Document Released: 09/21/2004 Document Revised: 01/13/2012 Document Reviewed: 04/29/2011 [employer] Patient Information 2015 [employer]. This information is not intended to replace advice given to you by your health care provider. Make sure you discuss any questions you have with your health care provider.  

## 2014-07-11 ENCOUNTER — Other Ambulatory Visit: Payer: Self-pay | Admitting: Family

## 2014-08-22 ENCOUNTER — Other Ambulatory Visit: Payer: Self-pay | Admitting: Family

## 2014-08-28 ENCOUNTER — Other Ambulatory Visit: Payer: Self-pay | Admitting: Family

## 2014-08-31 ENCOUNTER — Encounter: Payer: Self-pay | Admitting: Family

## 2014-08-31 ENCOUNTER — Ambulatory Visit (INDEPENDENT_AMBULATORY_CARE_PROVIDER_SITE_OTHER): Payer: Commercial Managed Care - HMO | Admitting: Family

## 2014-08-31 ENCOUNTER — Other Ambulatory Visit: Payer: Self-pay | Admitting: Family

## 2014-08-31 VITALS — BP 128/80 | HR 60 | Ht 62.0 in | Wt 202.7 lb

## 2014-08-31 DIAGNOSIS — M79604 Pain in right leg: Secondary | ICD-10-CM

## 2014-08-31 DIAGNOSIS — I10 Essential (primary) hypertension: Secondary | ICD-10-CM

## 2014-08-31 DIAGNOSIS — E785 Hyperlipidemia, unspecified: Secondary | ICD-10-CM

## 2014-08-31 LAB — HEPATIC FUNCTION PANEL
ALK PHOS: 59 U/L (ref 39–117)
ALT: 18 U/L (ref 0–35)
AST: 19 U/L (ref 0–37)
Albumin: 3.4 g/dL — ABNORMAL LOW (ref 3.5–5.2)
BILIRUBIN DIRECT: 0 mg/dL (ref 0.0–0.3)
BILIRUBIN TOTAL: 0.4 mg/dL (ref 0.2–1.2)
Total Protein: 7 g/dL (ref 6.0–8.3)

## 2014-08-31 LAB — BASIC METABOLIC PANEL
BUN: 18 mg/dL (ref 6–23)
CHLORIDE: 104 meq/L (ref 96–112)
CO2: 25 mEq/L (ref 19–32)
CREATININE: 0.8 mg/dL (ref 0.4–1.2)
Calcium: 9.8 mg/dL (ref 8.4–10.5)
GFR: 80.11 mL/min (ref >60.00)
Glucose, Bld: 81 mg/dL (ref 70–99)
POTASSIUM: 4.3 meq/L (ref 3.5–5.1)
Sodium: 140 mEq/L (ref 135–145)

## 2014-08-31 LAB — MAGNESIUM: Magnesium: 1.8 mg/dL (ref 1.5–2.5)

## 2014-08-31 MED ORDER — FUROSEMIDE 40 MG PO TABS: 40.0000 mg | ORAL_TABLET | Freq: Every day | ORAL | Status: DC

## 2014-08-31 MED ORDER — SIMVASTATIN 20 MG PO TABS: 20.0000 mg | ORAL_TABLET | Freq: Every day | ORAL | Status: DC

## 2014-08-31 NOTE — Progress Notes (Signed)
Pre visit review using our clinic review tool, if applicable. No additional management support is needed unless otherwise documented below in the visit note. 

## 2014-08-31 NOTE — Progress Notes (Signed)
Subjective:    Patient ID: Brianna Mccarty, female    DOB: 08/29/1941, 73 y.o.   MRN: [id number]  Leg Pain    73 year old white female, with a history of coronary artery disease exam today with complaints of right leg pain times several months but since the getting worse. Pain is worse when she is on her feet for an extended period of time. He rates the pain a 3 out of 10. Describes it as a dull ache. Denies swelling. The pain comes and goes them resolved without intervention. No history of any DVTs. No swelling. Has a history of low back pain with spinal stenosis. However, no active or back pain.   Review of Systems  Constitutional: Negative.   HENT: Negative.   Respiratory: Negative.   Cardiovascular: Negative.   Gastrointestinal: Negative.   Endocrine: Negative.   Genitourinary: Negative.   Musculoskeletal: Negative for back pain.       Right leg pain  Skin: Negative.   Neurological: Negative.   Psychiatric/Behavioral: Negative.    Past Medical History  Diagnosis Date  . Allergic rhinitis   . Asthma   . Bronchitis, mucopurulent recurrent   . Hypertension   . Chest pain, unspecified   . Carotid artery disease     Doppler, April, 2009, 0-39% bilateral  . Venous insufficiency   . Hypercholesterolemia   . GERD (gastroesophageal reflux disease)   . Diverticulosis of colon   . Interstitial cystitis   . Renal calculus   . DJD (degenerative joint disease)   . Osteoporosis   . Headache(784.0)   . Malignant melanoma   . Lichen sclerosus   . Abnormal EKG     Normal LV function in the past  . Ejection fraction     EF 60%, echo, 2009  . Mitral valve disease     Question mitral valve prolapse in the past, no prolapse by echo 2009  . Thyroid cyst     1 x 1.1 thyroid cyst noted on carotid Doppler, January, 2012  . Cervical dysplasia 1971    History   Social History  . Marital Status: Married    Spouse Name: Edison Nasuti    Number of Children: 2  . Years of Education: N/A    Occupational History  . retired    Social History Main Topics  . Smoking status: Never Smoker   . Smokeless tobacco: Never Used  . Alcohol Use: 1.5 oz/week    3 drink(s) per week     Comment: social use  . Drug Use: No  . Sexual Activity: No   Other Topics Concern  . Not on file   Social History Narrative  . No narrative on file    Past Surgical History  Procedure Laterality Date  . Left total hip replacement  2004    Dr. Gladstone Lighter  . Melanoma removed from medial rleft knee area  2006    Dr. [place] Crane  . Cystoscopy and basket stone removal right ureter  02/2006    Dr. Terance Hart  . Cardiac catheterization    . Breast surgery  2013    Breast Bx-Benign  . Nasal septum surgery    . Gynecologic cryosurgery  1971    Family History  Problem Relation Age of Onset  . Cancer Father     Pancreatic  . Diabetes Mother   . Heart disease Mother   . Cancer Brother     Bile duct  . Diabetes Brother   . Hypertension Brother   .  Diabetes Brother   . Heart disease Brother   . Hypertension Brother   . Hypertension Sister   . Hypertension Sister     Allergies  Allergen Reactions  . Cephalexin     REACTION: high fever  . Penicillins     REACTION: severe swelling and hives  . Phenobarbital     REACTION: hives    Current Outpatient Prescriptions on File Prior to Visit  Medication Sig Dispense Refill  . acyclovir (ZOVIRAX) 400 MG tablet Take 1 tablet (400 mg total) by mouth 2 (two) times daily.  60 tablet  6  . albuterol (PROAIR HFA) 108 (90 BASE) MCG/ACT inhaler Inhale 2 puffs into the lungs every 6 (six) hours as needed for wheezing.  3 Inhaler  3  . alendronate (FOSAMAX) 70 MG tablet Take 1 tablet (70 mg total) by mouth every 7 (seven) days. Take with a full glass of water on an empty stomach.  12 tablet  3  . amLODipine (NORVASC) 10 MG tablet TAKE 1 TABLET (10 MG TOTAL) BY MOUTH DAILY.  90 tablet  1  . Ascorbic Acid (VITAMIN C) 500 MG tablet Take 500 mg by mouth daily.         Marland Kitchen aspirin 81 MG tablet Take 81 mg by mouth daily.        . budesonide-formoterol (SYMBICORT) 160-4.5 MCG/ACT inhaler Inhale 2 puffs into the lungs 2 (two) times daily.  1 Inhaler  3  . Calcium Carbonate (CALCIUM 600) 1500 MG TABS Take 1 tablet by mouth daily.        . Cholecalciferol (VITAMIN D3) 5000 UNITS CAPS Take 1 capsule by mouth daily.        . Coenzyme Q10 (COQ10) 100 MG CAPS Take 1 capsule by mouth daily.        Marland Kitchen desmopressin (DDAVP) 0.2 MG tablet Take 3 tablets by mouth daily at bedtime       . fexofenadine (ALLEGRA) 180 MG tablet Take 180 mg by mouth daily.        . furosemide (LASIX) 40 MG tablet Take 1 tablet (40 mg total) by mouth daily.  90 tablet  3  . losartan (COZAAR) 100 MG tablet TAKE 1 TABLET (100 MG TOTAL) BY MOUTH DAILY.  90 tablet  0  . magnesium oxide (MAG-OX) 400 MG tablet Take 400 mg by mouth daily.        . Multiple Vitamin (MULTIVITAMIN) capsule Take 1 capsule by mouth daily.        Marland Kitchen NEXIUM 40 MG capsule TAKE 1 CAPSULE EVERY DAY  90 capsule  3  . pentosan polysulfate (ELMIRON) 100 MG capsule Take 100 mg by mouth 3 (three) times daily before meals.        . prazosin (MINIPRESS) 5 MG capsule TAKE 1 CAPSULE (5 MG TOTAL) BY MOUTH 2 (TWO) TIMES DAILY.  180 capsule  0  . PRESCRIPTION MEDICATION Allergy shots every other week      . Probiotic Product (PROBIOTIC PO) 1 daily      . simvastatin (ZOCOR) 20 MG tablet Take 1 tablet (20 mg total) by mouth at bedtime.  90 tablet  3  . albuterol (PROVENTIL) (2.5 MG/3ML) 0.083% nebulizer solution Take 3 mLs (2.5 mg total) by nebulization every 6 (six) hours as needed.  360 mL  0   No current facility-administered medications on file prior to visit.    BP 128/80  Pulse 60  Ht 5\' 2"  (1.575 m)  Wt 202 lb 11.2 oz (  91.944 kg)  BMI 37.06 kg/m2chart    Objective:   Physical Exam  Constitutional: She is oriented to person, place, and time. She appears well-developed and well-nourished.  HENT:  Right Ear: External ear normal.   Left Ear: External ear normal.  Nose: Nose normal.  Mouth/Throat: Oropharynx is clear and moist.  Neck: Normal range of motion. Neck supple. No thyromegaly present.  Cardiovascular: Normal rate, regular rhythm and normal heart sounds.   Pulmonary/Chest: Effort normal and breath sounds normal.  Abdominal: Soft. Bowel sounds are normal.  Musculoskeletal: Normal range of motion.  Neurological: She is alert and oriented to person, place, and time.  Skin: Skin is warm and dry.  Psychiatric: She has a normal mood and affect.          Assessment & Plan:  Brianna Mccarty was seen today for leg pain.  Diagnoses and associated orders for this visit:  Right leg pain - Basic Metabolic Panel - Hepatic Function Panel - Magnesium  Essential hypertension - Magnesium  Hyperlipidemia    Call the office with any questions or concerns. Consider lower extremity Doppler to rule out any vascular concerns. Also consider scan of the lumbar spine to see if this is causing pain in her right lower extremity. Review labs upon return.

## 2014-08-31 NOTE — Patient Instructions (Signed)
1. We will consider a lower extremity doppler and/or xray of the lumbar spine if labs are normal and symptoms persist.

## 2014-09-05 ENCOUNTER — Telehealth: Payer: Self-pay | Admitting: Family

## 2014-09-05 ENCOUNTER — Encounter: Payer: Self-pay | Admitting: Family

## 2014-09-05 DIAGNOSIS — M79604 Pain in right leg: Secondary | ICD-10-CM

## 2014-09-05 NOTE — Telephone Encounter (Signed)
Spoke with to advise she does not need to come in on 09/07/14 and labs are normal and were mailed.   Pt would like to know if PC will be ordering the doppler of leg or scan of back. Please advise

## 2014-09-05 NOTE — Telephone Encounter (Signed)
Doppler ordered

## 2014-09-05 NOTE — Telephone Encounter (Signed)
Pt has appt on 09-07-14 and was seen oct 28-15. Pt would like to know should she kept her appt. Pt also would like blood work results

## 2014-09-07 ENCOUNTER — Ambulatory Visit: Payer: Commercial Managed Care - HMO | Admitting: Family

## 2014-09-09 ENCOUNTER — Other Ambulatory Visit: Payer: Self-pay | Admitting: Family

## 2014-10-04 ENCOUNTER — Ambulatory Visit (HOSPITAL_COMMUNITY): Payer: Commercial Managed Care - HMO | Attending: Family | Admitting: *Deleted

## 2014-10-04 DIAGNOSIS — M79604 Pain in right leg: Secondary | ICD-10-CM | POA: Diagnosis present

## 2014-10-04 NOTE — Progress Notes (Signed)
Venous Duplex Lower Right Performed 

## 2014-10-11 ENCOUNTER — Telehealth: Payer: Self-pay | Admitting: Family

## 2014-10-11 DIAGNOSIS — Z85820 Personal history of malignant melanoma of skin: Secondary | ICD-10-CM

## 2014-10-11 NOTE — Telephone Encounter (Signed)
Pt need a referral to see her dermatologist for her yearly Melanoma check.  Doctor ;  [name]

## 2014-10-11 NOTE — Telephone Encounter (Signed)
Please advise 

## 2014-10-11 NOTE — Telephone Encounter (Signed)
Order placed

## 2014-10-25 ENCOUNTER — Encounter: Payer: Self-pay | Admitting: Family

## 2014-10-25 ENCOUNTER — Ambulatory Visit (INDEPENDENT_AMBULATORY_CARE_PROVIDER_SITE_OTHER): Payer: Commercial Managed Care - HMO | Admitting: Family

## 2014-10-25 VITALS — BP 132/82 | HR 98 | Temp 97.8°F | Ht 61.5 in | Wt 198.7 lb

## 2014-10-25 DIAGNOSIS — I10 Essential (primary) hypertension: Secondary | ICD-10-CM

## 2014-10-25 DIAGNOSIS — Z0000 Encounter for general adult medical examination without abnormal findings: Secondary | ICD-10-CM

## 2014-10-25 DIAGNOSIS — E7800 Pure hypercholesterolemia, unspecified: Secondary | ICD-10-CM

## 2014-10-25 DIAGNOSIS — E669 Obesity, unspecified: Secondary | ICD-10-CM

## 2014-10-25 DIAGNOSIS — M810 Age-related osteoporosis without current pathological fracture: Secondary | ICD-10-CM

## 2014-10-25 DIAGNOSIS — K219 Gastro-esophageal reflux disease without esophagitis: Secondary | ICD-10-CM

## 2014-10-25 LAB — CBC WITH DIFFERENTIAL/PLATELET
BASOS ABS: 0 10*3/uL (ref 0.0–0.1)
Basophils Relative: 0.7 % (ref 0.0–3.0)
Eosinophils Absolute: 0.2 10*3/uL (ref 0.0–0.7)
Eosinophils Relative: 2.9 % (ref 0.0–5.0)
HCT: 40.2 % (ref 36.0–46.0)
Hemoglobin: 13.1 g/dL (ref 12.0–15.0)
Lymphocytes Relative: 32.7 % (ref 12.0–46.0)
Lymphs Abs: 2.1 10*3/uL (ref 0.7–4.0)
MCHC: 32.6 g/dL (ref 30.0–36.0)
MCV: 88.4 fl (ref 78.0–100.0)
MONOS PCT: 9.1 % (ref 3.0–12.0)
Monocytes Absolute: 0.6 10*3/uL (ref 0.1–1.0)
Neutro Abs: 3.5 10*3/uL (ref 1.4–7.7)
Neutrophils Relative %: 54.6 % (ref 43.0–77.0)
PLATELETS: 246 10*3/uL (ref 150.0–400.0)
RBC: 4.55 Mil/uL (ref 3.87–5.11)
RDW: 15.5 % (ref 11.5–15.5)
WBC: 6.4 10*3/uL (ref 4.0–10.5)

## 2014-10-25 LAB — BASIC METABOLIC PANEL
BUN: 21 mg/dL (ref 6–23)
CO2: 29 meq/L (ref 19–32)
Calcium: 9.4 mg/dL (ref 8.4–10.5)
Chloride: 103 mEq/L (ref 96–112)
Creatinine, Ser: 0.7 mg/dL (ref 0.4–1.2)
GFR: 82.62 mL/min (ref >60.00)
Glucose, Bld: 108 mg/dL — ABNORMAL HIGH (ref 70–99)
Potassium: 4.3 mEq/L (ref 3.5–5.1)
SODIUM: 141 meq/L (ref 135–145)

## 2014-10-25 LAB — POCT URINALYSIS DIPSTICK
Bilirubin, UA: NEGATIVE
Blood, UA: NEGATIVE
GLUCOSE UA: NEGATIVE
Ketones, UA: NEGATIVE
Leukocytes, UA: NEGATIVE
NITRITE UA: NEGATIVE
Spec Grav, UA: 1.025
UROBILINOGEN UA: 0.2
pH, UA: 5.5

## 2014-10-25 LAB — LIPID PANEL
CHOLESTEROL: 185 mg/dL (ref 0–200)
HDL: 71 mg/dL (ref >39.00)
LDL Cholesterol: 107 mg/dL — ABNORMAL HIGH (ref 0–99)
NonHDL: 114
TRIGLYCERIDES: 37 mg/dL (ref 0.0–149.0)
Total CHOL/HDL Ratio: 3
VLDL: 7.4 mg/dL (ref 0.0–40.0)

## 2014-10-25 LAB — HEPATIC FUNCTION PANEL
ALBUMIN: 4.2 g/dL (ref 3.5–5.2)
ALK PHOS: 63 U/L (ref 39–117)
ALT: 23 U/L (ref 0–35)
AST: 22 U/L (ref 0–37)
Bilirubin, Direct: 0.1 mg/dL (ref 0.0–0.3)
Total Bilirubin: 0.5 mg/dL (ref 0.2–1.2)
Total Protein: 7.3 g/dL (ref 6.0–8.3)

## 2014-10-25 LAB — TSH: TSH: 2.63 u[IU]/mL (ref 0.35–4.50)

## 2014-10-25 MED ORDER — OMEPRAZOLE 40 MG PO CPDR: 40.0000 mg | DELAYED_RELEASE_CAPSULE | Freq: Every day | ORAL | Status: DC

## 2014-10-25 MED ORDER — ALENDRONATE SODIUM 70 MG PO TABS: 70.0000 mg | ORAL_TABLET | ORAL | Status: DC

## 2014-10-25 MED ORDER — ALBUTEROL SULFATE (2.5 MG/3ML) 0.083% IN NEBU: 2.5000 mg | INHALATION_SOLUTION | Freq: Four times a day (QID) | RESPIRATORY_TRACT | Status: DC | PRN

## 2014-10-25 NOTE — Progress Notes (Signed)
Subjective:    Patient ID: Brianna Mccarty, female    DOB: 1941-01-19, 73 y.o.   MRN: [id number]  HPI 73 year old white female, nonsmoker with a history of hypercholesterolemia, osteoporosis, hypertension, GERD, and obesity is in today for well care exam. Reports doing well. Does have concerns of acid reflux disease occurring a little more often. This child over-the-counter Nexium and Prevacid that have not helped. She has prescription Nexium but is gotten really expensive on her new insurance plan. Is requesting an alternative.  This is a routine wellness  examination for this patient . I reviewed all health maintenance protocols including mammography, colonoscopy, bone density Needed referrals were placed. Age and diagnosis  appropriate screening labs were ordered. Her immunization history was reviewed and appropriate vaccinations were ordered. Her current medications and allergies were reviewed and needed refills of her chronic medications were ordered. The plan for yearly health maintenance was discussed all orders and referrals were made as appropriate.   Review of Systems  Constitutional: Negative.   HENT: Negative.   Eyes: Negative.   Respiratory: Negative.   Cardiovascular: Negative.   Gastrointestinal: Negative for diarrhea and constipation.       Heartburn and indigestion  Endocrine: Negative.   Genitourinary: Negative.   Musculoskeletal: Negative.   Skin: Negative.   Allergic/Immunologic: Negative.   Neurological: Negative.   Hematological: Negative.   Psychiatric/Behavioral: Negative.   All other systems reviewed and are negative.  Past Medical History  Diagnosis Date  . Allergic rhinitis   . Asthma   . Bronchitis, mucopurulent recurrent   . Hypertension   . Chest pain, unspecified   . Carotid artery disease     Doppler, April, 2009, 0-39% bilateral  . Venous insufficiency   . Hypercholesterolemia   . GERD (gastroesophageal reflux disease)   . Diverticulosis of  colon   . Interstitial cystitis   . Renal calculus   . DJD (degenerative joint disease)   . Osteoporosis   . Headache(784.0)   . Malignant melanoma   . Lichen sclerosus   . Abnormal EKG     Normal LV function in the past  . Ejection fraction     EF 60%, echo, 2009  . Mitral valve disease     Question mitral valve prolapse in the past, no prolapse by echo 2009  . Thyroid cyst     1 x 1.1 thyroid cyst noted on carotid Doppler, January, 2012  . Cervical dysplasia 1971    History   Social History  . Marital Status: Married    Spouse Name: Edison Nasuti    Number of Children: 2  . Years of Education: N/A   Occupational History  . retired    Social History Main Topics  . Smoking status: Never Smoker   . Smokeless tobacco: Never Used  . Alcohol Use: 1.5 oz/week    3 drink(s) per week     Comment: social use  . Drug Use: No  . Sexual Activity: No   Other Topics Concern  . Not on file   Social History Narrative    Past Surgical History  Procedure Laterality Date  . Left total hip replacement  2004    Dr. Gladstone Lighter  . Melanoma removed from medial rleft knee area  2006    Dr. [place] Crane  . Cystoscopy and basket stone removal right ureter  02/2006    Dr. Terance Hart  . Cardiac catheterization    . Breast surgery  2013    Breast Bx-Benign  .  Nasal septum surgery    . Gynecologic cryosurgery  1971    Family History  Problem Relation Age of Onset  . Cancer Father     Pancreatic  . Diabetes Mother   . Heart disease Mother   . Cancer Brother     Bile duct  . Diabetes Brother   . Hypertension Brother   . Diabetes Brother   . Heart disease Brother   . Hypertension Brother   . Hypertension Sister   . Hypertension Sister     Allergies  Allergen Reactions  . Cephalexin     REACTION: high fever  . Penicillins     REACTION: severe swelling and hives  . Phenobarbital     REACTION: hives    Current Outpatient Prescriptions on File Prior to Visit  Medication Sig Dispense  Refill  . acyclovir (ZOVIRAX) 400 MG tablet Take 1 tablet (400 mg total) by mouth 2 (two) times daily. 60 tablet 6  . albuterol (PROAIR HFA) 108 (90 BASE) MCG/ACT inhaler Inhale 2 puffs into the lungs every 6 (six) hours as needed for wheezing. 3 Inhaler 3  . amLODipine (NORVASC) 10 MG tablet TAKE 1 TABLET (10 MG TOTAL) BY MOUTH DAILY. 90 tablet 1  . Ascorbic Acid (VITAMIN C) 500 MG tablet Take 500 mg by mouth daily.      Marland Kitchen aspirin 81 MG tablet Take 81 mg by mouth daily.      . budesonide-formoterol (SYMBICORT) 160-4.5 MCG/ACT inhaler Inhale 2 puffs into the lungs 2 (two) times daily. 1 Inhaler 3  . Calcium Carbonate (CALCIUM 600) 1500 MG TABS Take 1 tablet by mouth daily.      . Cholecalciferol (VITAMIN D3) 5000 UNITS CAPS Take 1 capsule by mouth daily.      . Coenzyme Q10 (COQ10) 100 MG CAPS Take 1 capsule by mouth daily.      Marland Kitchen desmopressin (DDAVP) 0.2 MG tablet Take 3 tablets by mouth daily at bedtime     . fexofenadine (ALLEGRA) 180 MG tablet Take 180 mg by mouth daily.      . furosemide (LASIX) 40 MG tablet Take 1 tablet (40 mg total) by mouth daily. 90 tablet 3  . losartan (COZAAR) 100 MG tablet TAKE 1 TABLET EVERY DAY 90 tablet 0  . magnesium oxide (MAG-OX) 400 MG tablet Take 400 mg by mouth daily.      . Multiple Vitamin (MULTIVITAMIN) capsule Take 1 capsule by mouth daily.      Marland Kitchen NEXIUM 40 MG capsule TAKE 1 CAPSULE EVERY DAY 90 capsule 3  . pentosan polysulfate (ELMIRON) 100 MG capsule Take 100 mg by mouth 3 (three) times daily before meals.      . prazosin (MINIPRESS) 5 MG capsule TAKE 1 CAPSULE TWICE DAILY 180 capsule 0  . PRESCRIPTION MEDICATION Allergy shots every other week    . Probiotic Product (PROBIOTIC PO) 1 daily    . simvastatin (ZOCOR) 20 MG tablet Take 1 tablet (20 mg total) by mouth at bedtime. 90 tablet 3   No current facility-administered medications on file prior to visit.    BP 132/82 mmHg  Pulse 98  Temp(Src) 97.8 F (36.6 C) (Oral)  Ht 5' 1.5" (1.562 m)   Wt 198 lb 11.2 oz (90.13 kg)  BMI 36.94 kg/m2chart    Objective:   Physical Exam  Constitutional: She is oriented to person, place, and time. She appears well-developed.  HENT:  Head: Normocephalic and atraumatic.  Right Ear: External ear normal.  Left Ear:  External ear normal.  Nose: Nose normal.  Mouth/Throat: Oropharynx is clear and moist.  Eyes: Conjunctivae and EOM are normal. Pupils are equal, round, and reactive to light.  Neck: Normal range of motion. Neck supple. No thyromegaly present.  Cardiovascular: Normal rate, regular rhythm and normal heart sounds.   Pulmonary/Chest: Effort normal and breath sounds normal.  Abdominal: Soft. Bowel sounds are normal.  Musculoskeletal: Normal range of motion.  Neurological: She is alert and oriented to person, place, and time. She has normal reflexes.  Skin: Skin is warm and dry.  Psychiatric: She has a normal mood and affect.          Assessment & Plan:  Demira was seen today for annual exam.  Diagnoses and associated orders for this visit:  Medicare annual wellness visit, subsequent  Essential hypertension - Basic Metabolic Panel - Hepatic Function Panel - TSH - POC Urinalysis Dipstick  Osteoporosis - Hepatic Function Panel - CBC with Differential  Gastroesophageal reflux disease, esophagitis presence not specified - Hepatic Function Panel  Pure hypercholesterolemia - Basic Metabolic Panel - Hepatic Function Panel - Lipid Panel  Obesity - CBC with Differential - TSH  Other Orders - Discontinue: alendronate (FOSAMAX) 70 MG tablet; Take 1 tablet (70 mg total) by mouth every 7 (seven) days. With a full glass of water on an empty stomach. - albuterol (PROVENTIL) (2.5 MG/3ML) 0.083% nebulizer solution; Take 3 mLs (2.5 mg total) by nebulization every 6 (six) hours as needed. - omeprazole (PRILOSEC) 40 MG capsule; Take 1 capsule (40 mg total) by mouth daily. - alendronate (FOSAMAX) 70 MG tablet; Take 1 tablet (70  mg total) by mouth every 7 (seven) days. With a full glass of water on an empty stomach.    Encouraged healthy diet, exercise. Discontinue Nexium and start omeprazole 40 mg once daily. Monthly self breast exams. Follow-up in 6 months fasting and sooner as needed.

## 2014-10-25 NOTE — Progress Notes (Signed)
Pre visit review using our clinic review tool, if applicable. No additional management support is needed unless otherwise documented below in the visit note. 

## 2014-10-25 NOTE — Patient Instructions (Signed)

## 2014-11-21 ENCOUNTER — Encounter: Payer: Self-pay | Admitting: Gastroenterology

## 2014-12-07 ENCOUNTER — Encounter: Payer: Self-pay | Admitting: Gynecology

## 2014-12-23 ENCOUNTER — Other Ambulatory Visit: Payer: Self-pay

## 2014-12-23 MED ORDER — OMEPRAZOLE 40 MG PO CPDR: 40.0000 mg | DELAYED_RELEASE_CAPSULE | Freq: Every day | ORAL | Status: DC

## 2014-12-23 NOTE — Telephone Encounter (Signed)
Rx request for Omeprazole DR 40 mg capsule- Take 1 capsule by mouth daily #90  Pharm:  [facility] [address]   Pls advise. Pt will establish with Dr. Maudie Mercury Jan 2017.

## 2015-01-05 ENCOUNTER — Telehealth: Payer: Self-pay | Admitting: Family

## 2015-01-05 MED ORDER — PRAZOSIN HCL 5 MG PO CAPS: 5.0000 mg | ORAL_CAPSULE | Freq: Two times a day (BID) | ORAL | Status: DC

## 2015-01-05 MED ORDER — FUROSEMIDE 40 MG PO TABS: 40.0000 mg | ORAL_TABLET | Freq: Every day | ORAL | Status: DC

## 2015-01-05 MED ORDER — LOSARTAN POTASSIUM 100 MG PO TABS: 100.0000 mg | ORAL_TABLET | Freq: Every day | ORAL | Status: DC

## 2015-01-05 MED ORDER — SIMVASTATIN 20 MG PO TABS: 20.0000 mg | ORAL_TABLET | Freq: Every day | ORAL | Status: DC

## 2015-01-05 NOTE — Telephone Encounter (Signed)
Done

## 2015-01-05 NOTE — Telephone Encounter (Signed)
Patient need 90 day scripts on the following: losartan (COZAAR) 100 MG tablet, prazosin (MINIPRESS) 5 MG capsule, simvastatin (ZOCOR) 20 MG tablet, furosemide (LASIX) 40 MG tablet sent to [id number] - [place], [place].  She is scheduled to see Dr. Maudie Mercury in January.

## 2015-01-10 ENCOUNTER — Ambulatory Visit (AMBULATORY_SURGERY_CENTER): Payer: Self-pay

## 2015-01-10 VITALS — Ht 61.5 in | Wt 194.2 lb

## 2015-01-10 DIAGNOSIS — Z800 Family history of malignant neoplasm of digestive organs: Secondary | ICD-10-CM

## 2015-01-10 MED ORDER — MOVIPREP 100 G PO SOLR: 1.0000 | Freq: Once | ORAL | Status: DC

## 2015-01-10 NOTE — Progress Notes (Signed)
No allergies to eggs or soy No home oxygen No diet/weight loss meds No past problems with anesthesia  Has email  Emmi instructions given for colonoscopy 

## 2015-01-24 ENCOUNTER — Encounter: Payer: Commercial Managed Care - HMO | Admitting: Gastroenterology

## 2015-02-07 ENCOUNTER — Encounter: Payer: Self-pay | Admitting: Gastroenterology

## 2015-02-07 ENCOUNTER — Ambulatory Visit (AMBULATORY_SURGERY_CENTER): Payer: PPO | Admitting: Gastroenterology

## 2015-02-07 VITALS — BP 143/68 | HR 60 | Temp 96.9°F | Resp 21 | Ht 61.0 in | Wt 194.0 lb

## 2015-02-07 DIAGNOSIS — Z800 Family history of malignant neoplasm of digestive organs: Secondary | ICD-10-CM

## 2015-02-07 DIAGNOSIS — Z8371 Family history of colonic polyps: Secondary | ICD-10-CM

## 2015-02-07 DIAGNOSIS — Z1211 Encounter for screening for malignant neoplasm of colon: Secondary | ICD-10-CM | POA: Diagnosis not present

## 2015-02-07 MED ORDER — SODIUM CHLORIDE 0.9 % IV SOLN
500.0000 mL | INTRAVENOUS | Status: DC
Start: 2015-02-07 — End: 2015-02-07

## 2015-02-07 NOTE — Progress Notes (Signed)
Stable to RR 

## 2015-02-07 NOTE — Op Note (Signed)
[facility]  [address] [place], [address]   COLONOSCOPY PROCEDURE REPORT  PATIENT: Brianna Mccarty, Brianna Mccarty  MR#: [id number] BIRTHDATE: 1941-02-27 , 51  yrs. old GENDER: female ENDOSCOPIST: Ladene Artist, MD, [facility] PROCEDURE DATE:  02/07/2015 PROCEDURE:   Colonoscopy, screening First Screening Colonoscopy - Avg.  risk and is 50 yrs.  old or older - No.  Prior Negative Screening - Now for repeat screening. Less than 10 yrs Prior Negative Screening - Now for repeat screening.  Above average risk  History of Adenoma - Now for follow-up colonoscopy & has been > or = to 3 yrs.  N/A ASA CLASS:   Class II INDICATIONS:Screening for colonic neoplasia, FH Colon or Rectal Adenocarcinoma, and FH Colon Adenoma. MEDICATIONS: Monitored anesthesia care, Propofol 200 mg IV, and lidocaine 40 mg IV DESCRIPTION OF PROCEDURE:   After the risks benefits and alternatives of the procedure were thoroughly explained, informed consent was obtained.  The digital rectal exam revealed no abnormalities of the rectum.   The LB [id number]  endoscope was introduced through the anus and advanced to the cecum, which was identified by both the appendix and ileocecal valve. No adverse events experienced.   The quality of the prep was excellent. (MoviPrep was used)  The instrument was then slowly withdrawn as the colon was fully examined.    COLON FINDINGS: There was mild diverticulosis noted in the sigmoid colon.   The examination was otherwise normal.  Retroflexed views revealed internal Grade I hemorrhoids. The time to cecum = 3.1 Withdrawal time = 11.8   The scope was withdrawn and the procedure completed. COMPLICATIONS: There were no immediate complications.  ENDOSCOPIC IMPRESSION: 1.   Mild diverticulosis was noted in the sigmoid colon 2.   The examination was otherwise normal  RECOMMENDATIONS: 1.  High fiber diet with liberal fluid intake. 2.  Given your age, you will not need  another colonoscopy for colon cancer screening or polyp surveillance.  These types of tests usually stop around the age 24.  eSigned:  Ladene Artist, MD, [facility] 02/07/2015 9:30 AM   cc: Colin Benton, DO

## 2015-02-07 NOTE — Patient Instructions (Signed)
YOU HAD AN ENDOSCOPIC PROCEDURE TODAY AT [facility]:   Refer to the procedure report that was given to you for any specific questions about what was found during the examination.  If the procedure report does not answer your questions, please call your gastroenterologist to clarify.  If you requested that your care partner not be given the details of your procedure findings, then the procedure report has been included in a sealed envelope for you to review at your convenience later.  YOU SHOULD EXPECT: Some feelings of bloating in the abdomen. Passage of more gas than usual.  Walking can help get rid of the air that was put into your GI tract during the procedure and reduce the bloating. If you had a lower endoscopy (such as a colonoscopy or flexible sigmoidoscopy) you may notice spotting of blood in your stool or on the toilet paper. If you underwent a bowel prep for your procedure, you may not have a normal bowel movement for a few days.  Please Note:  You might notice some irritation and congestion in your nose or some drainage.  This is from the oxygen used during your procedure.  There is no need for concern and it should clear up in a day or so.  SYMPTOMS TO REPORT IMMEDIATELY:   Following lower endoscopy (colonoscopy or flexible sigmoidoscopy):  Excessive amounts of blood in the stool  Significant tenderness or worsening of abdominal pains  Swelling of the abdomen that is new, acute  Fever of 100F or higher   For urgent or emergent issues, a gastroenterologist can be reached at any hour by calling [phone].   DIET: Your first meal following the procedure should be a small meal and then it is ok to progress to your normal diet. Heavy or fried foods are harder to digest and may make you feel nauseous or bloated.  Likewise, meals heavy in dairy and vegetables can increase bloating.  Drink plenty of fluids but you should avoid alcoholic beverages for 24 hours. Increase  the fiber in your diet.  ACTIVITY:  You should plan to take it easy for the rest of today and you should NOT DRIVE or use heavy machinery until tomorrow (because of the sedation medicines used during the test).    FOLLOW UP: Our staff will call the number listed on your records the next business day following your procedure to check on you and address any questions or concerns that you may have regarding the information given to you following your procedure. If we do not reach you, we will leave a message.  However, if you are feeling well and you are not experiencing any problems, there is no need to return our call.  We will assume that you have returned to your regular daily activities without incident.  If any biopsies were taken you will be contacted by phone or by letter within the next 1-3 weeks.  Please call us at [phone] if you have not heard about the biopsies in 3 weeks.    SIGNATURES/CONFIDENTIALITY: You and/or your care partner have signed paperwork which will be entered into your electronic medical record.  These signatures attest to the fact that that the information above on your After Visit Summary has been reviewed and is understood.  Full responsibility of the confidentiality of this discharge information lies with you and/or your care-partner.  Read the handouts given to you by your recovery room nurse.

## 2015-02-08 ENCOUNTER — Telehealth: Payer: Self-pay | Admitting: *Deleted

## 2015-02-08 NOTE — Telephone Encounter (Signed)
No identifier, left message, follow-up  

## 2015-02-24 ENCOUNTER — Other Ambulatory Visit: Payer: Self-pay | Admitting: Family

## 2015-02-24 NOTE — Telephone Encounter (Signed)
Have never seen this patient. Is scheduled in 2017. Needs appt for refill. Can refill to appt up to 1 month. Thanks.

## 2015-02-24 NOTE — Telephone Encounter (Signed)
Left a message for the pt to return my call.  

## 2015-02-28 NOTE — Telephone Encounter (Signed)
Patient is scheduled   

## 2015-03-06 ENCOUNTER — Encounter (INDEPENDENT_AMBULATORY_CARE_PROVIDER_SITE_OTHER): Payer: Commercial Managed Care - HMO | Admitting: Ophthalmology

## 2015-03-14 ENCOUNTER — Encounter: Payer: Self-pay | Admitting: Family Medicine

## 2015-03-14 ENCOUNTER — Ambulatory Visit (INDEPENDENT_AMBULATORY_CARE_PROVIDER_SITE_OTHER): Payer: PPO | Admitting: Family Medicine

## 2015-03-14 VITALS — BP 130/64 | HR 73 | Temp 98.0°F | Ht 61.0 in | Wt 186.7 lb

## 2015-03-14 DIAGNOSIS — Z6835 Body mass index (BMI) 35.0-35.9, adult: Secondary | ICD-10-CM

## 2015-03-14 DIAGNOSIS — N3010 Interstitial cystitis (chronic) without hematuria: Secondary | ICD-10-CM | POA: Diagnosis not present

## 2015-03-14 DIAGNOSIS — J4540 Moderate persistent asthma, uncomplicated: Secondary | ICD-10-CM | POA: Diagnosis not present

## 2015-03-14 DIAGNOSIS — E7800 Pure hypercholesterolemia, unspecified: Secondary | ICD-10-CM

## 2015-03-14 DIAGNOSIS — I10 Essential (primary) hypertension: Secondary | ICD-10-CM | POA: Diagnosis not present

## 2015-03-14 DIAGNOSIS — I779 Disorder of arteries and arterioles, unspecified: Secondary | ICD-10-CM

## 2015-03-14 DIAGNOSIS — I739 Peripheral vascular disease, unspecified: Secondary | ICD-10-CM

## 2015-03-14 DIAGNOSIS — I059 Rheumatic mitral valve disease, unspecified: Secondary | ICD-10-CM

## 2015-03-14 MED ORDER — AMLODIPINE BESYLATE 10 MG PO TABS: 10.0000 mg | ORAL_TABLET | Freq: Every day | ORAL | Status: DC

## 2015-03-14 NOTE — Progress Notes (Signed)
HPI:  Brianna Mccarty is a 74 yo F prior patient of Roxy Cedar, currently with no PCP here for acute visit for:  Med check:  HTN: -meds: norvasc 10 daily, losartan 100mg , lasix 40mg , prozosin 5 mg (reports she has been on this since she was 74 yo -denies: swelling, CP, SOB, DOE  HLD: -she is exercising and is trying to lose weight -she is on a statin and is tolerating this well without cog changes or cramps  Asthma: -sees pulmonologist for this -she feels like juice plus has really helped her  IC: -sees Dr. Junious Silk in urology -meds: hydroxyzine, desmopressin, elmiron -reports doing well  ROS: See pertinent positives and negatives per HPI.  Past Medical History  Diagnosis Date  . Allergic rhinitis   . Asthma   . Bronchitis, mucopurulent recurrent   . Hypertension   . Chest pain, unspecified   . Carotid artery disease     Doppler, April, 2009, 0-39% bilateral  . Venous insufficiency   . Hypercholesterolemia   . GERD (gastroesophageal reflux disease)   . Diverticulosis of colon   . Interstitial cystitis   . Renal calculus   . DJD (degenerative joint disease)   . Osteoporosis   . Headache(784.0)   . Malignant melanoma   . Lichen sclerosus   . Abnormal EKG     Normal LV function in the past  . Ejection fraction     EF 60%, echo, 2009  . Mitral valve disease     Question mitral valve prolapse in the past, no prolapse by echo 2009  . Thyroid cyst     1 x 1.1 thyroid cyst noted on carotid Doppler, January, 2012  . Cervical dysplasia 1971    Past Surgical History  Procedure Laterality Date  . Left total hip replacement  2004    Dr. Gladstone Lighter  . Melanoma removed from medial rleft knee area  2006    Dr. [place] Crane  . Cystoscopy and basket stone removal right ureter  02/2006    Dr. Terance Hart  . Cardiac catheterization    . Breast surgery  2013    Breast Bx-Benign  . Nasal septum surgery    . Gynecologic cryosurgery  1971  . Cataract extraction, bilateral       Family History  Problem Relation Age of Onset  . Cancer Father     Pancreatic  . Diabetes Mother   . Heart disease Mother   . Cancer Brother     Bile duct  . Diabetes Brother   . Hypertension Brother   . Diabetes Brother   . Heart disease Brother   . Hypertension Brother   . Hypertension Sister   . Hypertension Sister   . Colon cancer Paternal Uncle     History   Social History  . Marital Status: Married    Spouse Name: Edison Nasuti  . Number of Children: 2  . Years of Education: N/A   Occupational History  . retired    Social History Main Topics  . Smoking status: Never Smoker   . Smokeless tobacco: Never Used  . Alcohol Use: 1.5 oz/week    3 Standard drinks or equivalent per week     Comment: social use  . Drug Use: No  . Sexual Activity: No   Other Topics Concern  . None   Social History Narrative     Current outpatient prescriptions:  .  acyclovir (ZOVIRAX) 400 MG tablet, Take 400 mg by mouth 5 (five) times daily., Disp: ,  Rfl:  .  albuterol (PROAIR HFA) 108 (90 BASE) MCG/ACT inhaler, Inhale 2 puffs into the lungs every 6 (six) hours as needed for wheezing., Disp: 3 Inhaler, Rfl: 3 .  albuterol (PROVENTIL) (2.5 MG/3ML) 0.083% nebulizer solution, Take 3 mLs (2.5 mg total) by nebulization every 6 (six) hours as needed., Disp: 360 mL, Rfl: 0 .  alendronate (FOSAMAX) 70 MG tablet, Take 1 tablet (70 mg total) by mouth every 7 (seven) days. With a full glass of water on an empty stomach., Disp: 12 tablet, Rfl: 3 .  amLODipine (NORVASC) 10 MG tablet, Take 1 tablet (10 mg total) by mouth daily., Disp: 90 tablet, Rfl: 3 .  Ascorbic Acid (VITAMIN C) 500 MG tablet, Take 500 mg by mouth daily.  , Disp: , Rfl:  .  aspirin 81 MG tablet, Take 81 mg by mouth daily.  , Disp: , Rfl:  .  budesonide-formoterol (SYMBICORT) 160-4.5 MCG/ACT inhaler, Inhale 2 puffs into the lungs 2 (two) times daily., Disp: 1 Inhaler, Rfl: 3 .  Calcium Carbonate (CALCIUM 600) 1500 MG TABS, Take 1  tablet by mouth daily.  , Disp: , Rfl:  .  Cholecalciferol (VITAMIN D3) 5000 UNITS CAPS, Take 1 capsule by mouth daily.  , Disp: , Rfl:  .  Cinnamon 500 MG TABS, Take by mouth. With Chromium, Disp: , Rfl:  .  Coenzyme Q10 (COQ10) 100 MG CAPS, Take 1 capsule by mouth daily.  , Disp: , Rfl:  .  desmopressin (DDAVP) 0.2 MG tablet, Take 3 tablets by mouth daily at bedtime , Disp: , Rfl:  .  furosemide (LASIX) 40 MG tablet, Take 1 tablet (40 mg total) by mouth daily., Disp: 90 tablet, Rfl: 3 .  hydrOXYzine (ATARAX/VISTARIL) 10 MG tablet, Take 10 mg by mouth daily., Disp: , Rfl:  .  losartan (COZAAR) 100 MG tablet, Take 1 tablet (100 mg total) by mouth daily., Disp: 90 tablet, Rfl: 3 .  magnesium oxide (MAG-OX) 400 MG tablet, Take 400 mg by mouth daily.  , Disp: , Rfl:  .  Multiple Vitamin (MULTIVITAMIN) capsule, Take 1 capsule by mouth daily.  , Disp: , Rfl:  .  omeprazole (PRILOSEC) 40 MG capsule, Take 1 capsule (40 mg total) by mouth daily., Disp: 90 capsule, Rfl: 0 .  OVER THE COUNTER MEDICATION, Bone growth factor, Disp: , Rfl:  .  OVER THE COUNTER MEDICATION, 3 oz daily. [place], Disp: , Rfl:  .  OVER THE COUNTER MEDICATION, Ocuvite 50 +, Disp: , Rfl:  .  OVER THE COUNTER MEDICATION, Juice plus, Disp: , Rfl:  .  OVER THE COUNTER MEDICATION, muscadine grape seed for arthritis, Disp: , Rfl:  .  pentosan polysulfate (ELMIRON) 100 MG capsule, Take 100 mg by mouth 3 (three) times daily before meals.  , Disp: , Rfl:  .  prazosin (MINIPRESS) 5 MG capsule, Take 1 capsule (5 mg total) by mouth 2 (two) times daily., Disp: 180 capsule, Rfl: 3 .  PRESCRIPTION MEDICATION, Allergy shots every week, Disp: , Rfl:  .  Probiotic Product (PROBIOTIC PO), 1 daily, Disp: , Rfl:  .  Red Yeast Rice Extract (RED YEAST RICE PO), Take by mouth 2 (two) times daily., Disp: , Rfl:  .  simvastatin (ZOCOR) 20 MG tablet, Take 1 tablet (20 mg total) by mouth at bedtime., Disp: 90 tablet, Rfl: 3  EXAM:  Filed Vitals:    03/14/15 1508  BP: 130/64  Pulse: 73  Temp: 98 F (36.7 C)    Body mass  index is 35.3 kg/(m^2).  GENERAL: vitals reviewed and listed above, alert, oriented, appears well hydrated and in no acute distress  HEENT: atraumatic, conjunttiva clear, no obvious abnormalities on inspection of external nose and ears  NECK: no obvious masses on inspection  LUNGS: clear to auscultation bilaterally, no wheezes, rales or rhonchi, good air movement  CV: HRRR, SEM, no peripheral edema  MS: moves all extremities without noticeable abnormality  PSYCH: pleasant and cooperative, no obvious depression or anxiety  ASSESSMENT AND PLAN:  Discussed the following assessment and plan:  Essential hypertension  HYPERCHOLESTEROLEMIA  Asthma, moderate persistent, uncomplicated  INTERSTITIAL CYSTITIS  -lifestyle recs -cont current medication, refills per request -follow up as scheduled and as needed -Patient advised to return or notify a doctor immediately if symptoms worsen or persist or new concerns arise.  There are no Patient Instructions on file for this visit.   Colin Benton R.

## 2015-03-14 NOTE — Progress Notes (Signed)
Pre visit review using our clinic review tool, if applicable. No additional management support is needed unless otherwise documented below in the visit note. 

## 2015-03-17 ENCOUNTER — Other Ambulatory Visit: Payer: Self-pay | Admitting: Family Medicine

## 2015-03-25 ENCOUNTER — Other Ambulatory Visit: Payer: Self-pay | Admitting: Family Medicine

## 2015-05-01 ENCOUNTER — Encounter (INDEPENDENT_AMBULATORY_CARE_PROVIDER_SITE_OTHER): Payer: PPO | Admitting: Ophthalmology

## 2015-05-01 DIAGNOSIS — H35033 Hypertensive retinopathy, bilateral: Secondary | ICD-10-CM

## 2015-05-01 DIAGNOSIS — H35341 Macular cyst, hole, or pseudohole, right eye: Secondary | ICD-10-CM | POA: Diagnosis not present

## 2015-05-01 DIAGNOSIS — D3132 Benign neoplasm of left choroid: Secondary | ICD-10-CM

## 2015-05-01 DIAGNOSIS — H35372 Puckering of macula, left eye: Secondary | ICD-10-CM | POA: Diagnosis not present

## 2015-05-01 DIAGNOSIS — H33303 Unspecified retinal break, bilateral: Secondary | ICD-10-CM | POA: Diagnosis not present

## 2015-05-01 DIAGNOSIS — I10 Essential (primary) hypertension: Secondary | ICD-10-CM

## 2015-05-01 DIAGNOSIS — D3131 Benign neoplasm of right choroid: Secondary | ICD-10-CM

## 2015-05-15 ENCOUNTER — Other Ambulatory Visit: Payer: Self-pay | Admitting: Cardiovascular Disease

## 2015-05-15 DIAGNOSIS — I6523 Occlusion and stenosis of bilateral carotid arteries: Secondary | ICD-10-CM

## 2015-05-17 ENCOUNTER — Other Ambulatory Visit: Payer: Self-pay | Admitting: Family

## 2015-05-24 ENCOUNTER — Encounter (HOSPITAL_COMMUNITY): Payer: PPO

## 2015-05-31 ENCOUNTER — Ambulatory Visit (HOSPITAL_COMMUNITY): Payer: PPO | Attending: Cardiology

## 2015-05-31 DIAGNOSIS — I6523 Occlusion and stenosis of bilateral carotid arteries: Secondary | ICD-10-CM | POA: Insufficient documentation

## 2015-06-23 ENCOUNTER — Telehealth: Payer: Self-pay | Admitting: Pulmonary Disease

## 2015-06-23 MED ORDER — ALBUTEROL SULFATE HFA 108 (90 BASE) MCG/ACT IN AERS: 2.0000 | INHALATION_SPRAY | Freq: Four times a day (QID) | RESPIRATORY_TRACT | Status: DC | PRN

## 2015-06-23 MED ORDER — ALBUTEROL SULFATE (2.5 MG/3ML) 0.083% IN NEBU: 2.5000 mg | INHALATION_SOLUTION | Freq: Four times a day (QID) | RESPIRATORY_TRACT | Status: DC | PRN

## 2015-06-23 NOTE — Telephone Encounter (Signed)
Called spoke with pt. She needs refill on her proair and albuterol neb. I advised will send this in. Nothing further needed

## 2015-06-27 ENCOUNTER — Encounter: Payer: Self-pay | Admitting: Pulmonary Disease

## 2015-06-27 ENCOUNTER — Encounter (INDEPENDENT_AMBULATORY_CARE_PROVIDER_SITE_OTHER): Payer: Self-pay

## 2015-06-27 ENCOUNTER — Ambulatory Visit (INDEPENDENT_AMBULATORY_CARE_PROVIDER_SITE_OTHER): Payer: PPO | Admitting: Pulmonary Disease

## 2015-06-27 VITALS — BP 130/74 | HR 68 | Temp 97.1°F | Wt 189.8 lb

## 2015-06-27 DIAGNOSIS — K219 Gastro-esophageal reflux disease without esophagitis: Secondary | ICD-10-CM

## 2015-06-27 DIAGNOSIS — J301 Allergic rhinitis due to pollen: Secondary | ICD-10-CM | POA: Diagnosis not present

## 2015-06-27 DIAGNOSIS — I059 Rheumatic mitral valve disease, unspecified: Secondary | ICD-10-CM

## 2015-06-27 DIAGNOSIS — I779 Disorder of arteries and arterioles, unspecified: Secondary | ICD-10-CM

## 2015-06-27 DIAGNOSIS — I10 Essential (primary) hypertension: Secondary | ICD-10-CM | POA: Diagnosis not present

## 2015-06-27 DIAGNOSIS — I872 Venous insufficiency (chronic) (peripheral): Secondary | ICD-10-CM

## 2015-06-27 DIAGNOSIS — I739 Peripheral vascular disease, unspecified: Secondary | ICD-10-CM

## 2015-06-27 DIAGNOSIS — J4530 Mild persistent asthma, uncomplicated: Secondary | ICD-10-CM

## 2015-06-27 NOTE — Patient Instructions (Signed)
Today we updated your med list in our EPIC system...    Continue your current medications the same...    We refilled the meds you requested...  We will sched a follow up 2DEchocardiogram...    We will contact you w/ the results when available...   Call for any questions...  Let's plan a follow up visit in 50yr, sooner if needed for problems.Marland KitchenMarland Kitchen

## 2015-06-27 NOTE — Progress Notes (Signed)
Subjective:    Patient ID: Brianna Mccarty, female    DOB: 11/29/1940, 74 y.o.   MRN: [id number]  HPI 74 y/o WF here for a follow up visit... she has hx refractory asthmatic bronchitis requiring hosp & IV Solumedrol & max bronchodilator Rx etc... in the outpt setting she gets frequent courses of Prednisone & we have endeavored to maximize her inhaled regimen so as to keep her out of the hosp & off the oral steroids as much as poss... she also sees DrESL/VanWinkle for allergy shots, and she has seen DrKozlow in the past who felt that she had a major component of reflux causing her refractory airways dis...  SEE PREV EPIC NOTES FOR EARLIER DATA >>  ~  April 22, 2012:  8mo ROV & Brianna Mccarty continues to do satis from the pulm standpoint w/o exacerbation;  She had left breast biopsy 1/13 that turned out benign- prob fibroadenoma;  She continues on Nopalea supplement w/ it's anti-inflamm properties;  She saw DrFontaine 1/44> hx lichen sclerosis- uses Temovate cream & doing well, he deferred Osteoporosis testing & follow up to [place] & she is noted to be on both Evista & Alendronate; she wants to stop the Evista due to expense & this is ok w/ me;  She also saw DrKatz for Cards f/u 5/13> doing well & no changes made...     We reviewed prob list, meds, xrays and labs> see below>>  ~  October 21, 2012:  53mo ROV & Brianna Mccarty notes doing well w/o recent issues, no new complaints or concerns... We reviewed the following medical problems during today's office visit >> reminded to bring all meds to every office visit...    AR, Asthma, Bronchitis> on NEBS w/ Albut, Symbicort80, Allegra180; she has last Asthma exac 8/13- treated by DrSharma w/ Depo/ Pred/ ZPak/ & incr Symbicort to 160; she improved & ret to baseline but didn't bring meds to this OV...    HBP, HxCP> on ASA81, Amlod10, Losar100, Minipress5, Lasix40;  BP=144/80 & she denies CP, palpit, SOB, edema; followed by [facility]- seen 5/13 for f/u HBP, CP, SOB; doing satis & no  changes made...    Cerebrovasc dis> on ASA81; she had f/u CDopplers 1/12 showing stable mild heterogeneous plaque in bulbs & 0-39% ICA stenoses (incidental 1cm right thyroid cyst).    VenInsuffic> on low sodium, elevation, support hose...    Chol> on Simva20; FLP shows TChol 152, TG 34, HDL 64, LDL 81    GI- HH, GERD, Divertics> on Prev30bid & probiotic daily; she denies abd pain, dysphagia, n/v, c/d, blood seen; known [place], last colon 9/10 by DrStark w/ divertics only, but there is a FamHx colon ca in uncle, therefore f/u 36yrs...    GU- IC, KidStones> on DDAVP & Elmiron100Tid; she is s/p DMSO treatments; states voiding is fine, denies recent leakage, incont, etc; last stone was 2007...    DJD, Osteopenia> on Norco5, Alendronate70/wk, calcium, MVI, VitD; she is s/p leftTHR by DrGioffre2004; last BMD 1/12 showed TScore -2.3 in [facility]...    HAs> she is s/p eval in HA clinics 1994 & 2003...    HxMelanoma> removed from medial left knee in 2006 by [facility]... We reviewed prob list, meds, xrays and labs> see below for updates >> she had the 2013 Flu vaccine 10/13; she has requested refill prescriptions today...  LABS 12/13:  FLP- at goals on Simva20;  Chems- wnl;  CBC- wnl;  TSH=2.43;  VitD=41   ~  May 18, 2013:  34mo ROV &  Brianna Mccarty has had a good interval- mild allergy symptoms in the spring, doing well overall & they cut back her allergy shots to every other wk; she still credits much of her improvement to Nopolea juice that she drinks daily for the last 19yr- "it's good for inflammation & helps my asthma"...     Asthma is stable on Symbicort80- taking one puff Bid & ProventilHFA vs NEB prn...     BP looks good on Amlod10, Minipress5Bid, Cozaar100, Lasix40; BP= 128/72 & she denies CP, palpit, dizzy, SOB, edema...    Chol has been well regulated on Simva20, RYR, CoQ10 w/ FLP 12/13 within parameters...    GI is stable on Prev30Bid & probiotic daily, followed by DrStark...    GU remains stable on Elmiron &  DDAVP; followed by DrPeterson & Fontaine... We reviewed prob list, meds, xrays and labs> see below for updates >>   ~  December 06, 2013:  6-771moOV & Brianna Mccarty a number of questions today> notes freq fever blisters & wants rx (Acyclovir prn); wants Prevnar-13 (OK); wants CXR & BMD (ordered); has mole on her back & we will refer to Derm for excision; wonders about a CRP test (we discussed this); wants to change her PPI to Protonix (OK)... We reviewed the following medical problems during today's office visit >>     AR, Asthma, Bronchitis> on NEBS w/ Albut, Symbicort80, Allegra180; she has last Asthma exac 12/14- treated by TP w/ Depo/ Pred/ ZPak/ & incr Symbicort etc; she improved & ret to baseline but didn't bring meds to this OV...    HBP, HxCP> on ASA81, Amlod10, Losar100, Minipress5, Lasix40;  BP=138/72 & she denies CP, palpit, SOB, edema; [facility], SO[facility]; doing satis & no changes made...    Cerebrovasc dis> on ASA81; she had f/u CDopplers 4/14 showing stable mild heterogeneous plaque in bulbs & 0-39% ICA stenoses (incidental 1cm right thyroid cyst).    VenInsuffic> on low sodium, elevation, support hose...    Chol> on Simva20; FLP 2/15 shows TChol 164, TG 43, HDL 62, LDL 94    GI- HH, GERD, Divertics> on Prev30bid & probiotic daily; she denies abd pain, dysphagia, n/v, c/d, blood seen; [place]ark w/ divertics only, but there is a FamHx colon ca in uncle, therefore f/u 5y19yr.    GU- IC, KidStones> on DDAVP & Elmiron100Tid; she is s/p DMSO treatments; states voiding is fine, denies recent leakage, incont, etc; last stone was 2007...    DJD, Osteopenia> on Norco5, Alendronate70/wk, calcium, MVI, VitD; she is s/p leftTHR by DrGioffre2004; last BMD [facility]12 [facility]    HAs> she is s/p eval in HA clinics 1994 & 2003...    HxMelanoma> [facility] We reviewed prob list, meds, xrays and labs> see  below for updates >>   CXR 2/15 showed norm heart size, chronic changes w/ incr markings and blunt right angle, NAD  PFTs 2/15 showed FVC=2.38 (91%), FEV1=1.56 (79%), %1sec=65%, mid-flows=38% predicted... c/w GOLD Stage1 COPD  BMD 2/15 showed TScores +0.3 in Spine and -1.8 in femNeck (improved on the Alendronate)...  LABS 2/15:  FLP at goals on Simva20;  Chems- wnl;  CBC- wnl;  TSH= 2.55;  VitD= 48   ~  June 27, 2015:  73m28mo & Brianna Mccarty me that she's had a good yr, feeling well, no new complaints or concerns; she proudly reports that her grandson graduated from  [employer] & is now in helicopter school;  We reviewed the following medical problems during today's office visit >>     AR, Asthma, Bronchitis> on NEBS w/ Albut (ave <1/mo), Symbicort160Bid, OTC antihist prn; she denies recent asthma exac, hasn't required antibiotics, pred, etc; stable DOE, denies cough/ sput/ hemoptysis/ CP/ etc; states juice plus helps    HBP, HxCP> on ASA81, Amlod10, Losar100, Minipress5, Lasix40;  BP=130/74 & she denies CP, palpit, SOB, edema; followed by DrKatz (last 5/13) for HBP, CP, SOB; doing satis & no changes made...    Cerebrovasc dis> on ASA81; she had f/u CDopplers 7/16 showing stable mild heterogeneous plaque in bulbs & 1-39% Bilat ICA stenoses (incidental 1cm right thyroid cyst).    VenInsuffic> on low sodium, elevation, support hose...    Chol> on Simva20; [place] 12/15 shows TChol 185, TG 37, HDL 71, LDL 107    GI- HH, GERD, Divertics> on Prilosec40 & probiotic daily; she denies abd pain, dysphagia, n/v, c/d, blood seen; known [place], f/u colon 4/16 by DrStark w/ divertics & Gr1 hems...     GU- IC, KidStones> on DDAVP & Elmiron100Tid; she is s/p DMSO treatments; states voiding is fine, denies recent leakage, incont, etc; last stone was 2007...    DJD, Osteopenia> on Norco5, Alendronate70/wk, calcium, MVI, VitD; she is s/p leftTHR by DrGioffre2004; last BMD 1/12 showed TScore -2.3 in [facility]...    HAs>  she is s/p eval in HA clinics 1994 & 2003...    HxMelanoma> removed from medial left knee in 2006 by DrHall... EXAM shows Afeb, VSS, O2sat=97% on RA;  HEENT- neg;  Chest- clear w/o w/r/r;  Heart- RR gr1-2/6 SEM w/o r/g;  Abd- soft, neg;  Ext- w/o c/c/e;  Neuro- intact... We reviewed prob list, meds, xrays and labs> see below for updates >> Her new PCP is DrHKim...  CDoppler 7/16 showed bilat heterogeneous plaque, stable 1-39% bilat ICAstenoses, norm subclav & patent vertebrals w/ antegrade flow (f/u 66yrs)...  2DEcho 8/16 showed norm LVF w/ EF=60-65%, no regional wall motion abn, AV mildly thickened & calcif (sclerosis w/o stenosis), mitral annular calcif w/o MR/MS, LA dil at 6mm, no ASD or PFO, norm RV & PA... ?why the LA dil?          Problem List:  ALLERGIC RHINITIS (ICD-477.9) ASTHMA (ICD-493.90) - she takes ALLEGRA $RemoveBefor'180mg'KLKYPUtoOQqm$ /d, SYMBICORT 80- 1spBid, PROAIR & NEBS w/ Albuterol per DrESL; in addition she still gets weekly shots from [facility] clinic, & he treats her w/ freq courses of Pred for exacerbations... ~  CXR 6/09 was clear...  ~  CXR 2/10 hosp showed mild peribronch thickening, NAD... ~  PFT 3/10 showed FVC=2.36 (90%), FEV1=1.78 (85%), %1sec=75, mid-flows= 57%pred. ~  4/10: she saw DrKozlow for second opinion & Rx of her AB & GERD... ~  CXR 2/11 showed clear lungs, NAD... f/u 7/11= same (clear, NAD).Marland Kitchen. ~  RAST testing 5/11 showed IgE= 130, & neg rast tests x cockroach +low titer. ~  CXR 7/11 showed mild cardiomeg, clear lungs, NAD. ~  Sinus CT 8/11 showed no signif inflamm dis, mild membrane thickening, min ethmoid changes on right. ~  PFT 8/11 showed FVC=2.04 (71%), FEV1=1.58 (73%), %1sec=78, mid-flows= 70%pred. ~  11/12:  She reports no recent infectious exac & doing better on a new supplement "nopalea" which she says reduces inflamm. ~  12/13: on NEBS w/ Albut, Symbicort80, Allegra180; she has last Asthma exac 8/13- treated by DrSharma w/ Depo/ Pred/ ZPak/ & incr  Symbicort to 160; she improved &  ret to baseline but didn't bring meds to this OV... ~  7/14: Asthma is stable on Symbicort80- taking one puff Bid & ProventilHFA vs NEB prn  BRONCHITIS, RECURRENT (ICD-491.9) - hx freq infectious exac... ~  CTChest 2008 w/ 53mm nodule in RUL- unchanged over 73mo interval, small HH, noncalcif pleural plaques bilat... she is reassured w/ the recent CXR & serial films not showing nodule. ~  no recent resp infections but she likes to keep ZPak handy just in case.  HYPERTENSION (ICD-401.9) &  CHEST PAIN-UNSPECIFIED (ICD-786.50) - controlled on 4 meds:  NORVASC $Remove'10mg'PmqjQpN$ /d, LOSARTAN $RemoveBefo'100mg'JpHBHLWzcBf$ /d, MINIPRESS $RemoveBefor'5mg'XZvcORXOXYsn$ Bid, & LASIX $Remov'40mg'huPEGj$ /d... ~  2DEcho 8/09 showed norm LV wall motion & EF= 60-65%, mild incr AoV thickness, mild LA dil & atrial septal aneurysm noted. ~  EKG 9/10 by DrKatz- NSR, WNL... ~  2DEcho 9/10 showed norm LV wall motion & thickness, EF= 60-65%, gr I DD noted, sl dil LA, right side OK...  ~  9/11:  BP= 130/84, and BP's at home are even better- 120's/ 60's per pt... denies HA, visual changes, CP, palipit, dizziness, syncope, edema, etc... ~  1/12:  She had f/u appt DrKatz- stable, no changes made... ~  5/12:  BP= 122/74 and stable on meds... ~  11/12:  BP= 126/74 & similar at home she says... ~  5/13:  She saw DrKatz for Cards f/u> EKG showed NSR, rate76, poor R progression, NAD; stable & doing well- no changes made... ~  6/13:  BP= 138/78 & she is feeling well; denies CP, palpit, SOB, edema, etc... ~  12/13:  on ASA81, Amlod10, Losar100, Minipress5, Lasix40;  BP=144/80 & she denies CP, palpit, SOB, edema; followed [facility]- seen 5/13 for f/u HBP, CP, SOB; doing satis & no changes made... ~  7/14: BP looks good on Amlod10, Minipress5Bid, Cozaar100, Lasix40; BP= 128/72 & she denies CP, palpit, dizzy, SOB, edema.  CEREBROVASCULAR DISEASE (ICD-437.9) - on ASA $Remo'81mg'uNmDc$ /d... she had an abn lifeline screen 3/08 w/ left Carotid in the mild/mod range... ~  CDopplers 4/09 showed mild  smooth heterogen plaque in bulbs, 0-39% ICA stenoses... ~  f/u CDoppler 1/12 by Benay Spice showed mild heterogen plaque in bulbs, 0-39% bilat ICAstenoses... 1cm thyr cyst noted- not palpated.  VENOUS INSUFFICIENCY (ICD-459.81) - she knows to avoid sodium, elevate legs, wear support hose, continue the Lasix...  HYPERCHOLESTEROLEMIA (ICD-272.0) - on SIMVASTATIN $RemoveBefore'20mg'DiXgufFJtWlcM$ /d + CoQ10 daily... prev intol to statins w/ severe leg cramps but the addition of the CoQ10 has made a world of difference to her tolerability... ~  [place] 3/08 on diet alone showed TChol 268, TG 35, HDL 96, LDL 166... ~  [place] 9/08 on Crestor $RemoveBe'5mg'VezVGgONA$ /d showed TChol 212, TG 37, HDL 73, LDL 127... continue Rx. ~  [place] 12/09 showed TChol 179, TG 38, HDL 78, LDL 93... continue same Rx. ~  FLP 2/11 on Cres5 showed TChol 161, TG 42, HDL 70, LDL 83... pt switched to Simva20 per request. ~  [place] 9/11 on Simva20 showed TChol 209, TG 42, HDL 72, LDL 127... Same med, better diet, get wt down. ~  [place] 11/12 on Simva20 showed TChol 168, TG 35, HDL 67, LDL 94 ~  FLP 12/13 on simva20 showed TChol 152, TG 34, HDL 64, LDL 81   GERD (ICD-530.81) - on PREVACID $RemoveBef'30mg'EybqKxhtwI$ Bid + max antireflux regimen Qhs. ~  last EGD 6/08 by DrSam showed [place], gastitis, and stricture- dilated... prev HPylori neg in 2007.  DIVERTICULOSIS OF COLON (ICD-562.10) ~  colonoscopy 3/05 by DrSam showed divertics  only... f/u planned 56yrs (+fam hx in uncle). ~  colonoscopy 9/10 by DrStark showed divertics only... he plans f/u 9yrs.  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 she has had DMSO in bladder + taking> ELMIRON $RemoveBeforeD'100mg'XqYqSanbpnPHtQ$ Tid, & DDAVP 0.$RemoveB'2mg'XjTRoGlv$ -3tabsHS (off prev Vesicare)... last DMSO treatment ~5/11 per pt. ~  Note:  She is followed by DrFontaine for GYN...  RENAL CALCULUS, HX OF (ICD-V13.01) - required cysto & basket stone removal from right ureter in 2007...  DEGENERATIVE JOINT DISEASE (ICD-715.90) - she is s/p left THR by DrGioffre in 2004...  OSTEOPOROSIS (ICD-733.00) -  on ALENDRONATE $RemoveBefore'70mg'UxQBDwupNEuLs$ /wk started 1/12; + Calcium, MVI, VitD; prev on Evista & this was stopped 6/13... ~  labs 12/09 showed Vit D level = 34... advised to start Vit D OTC 1-2,000 u daily... ~  BMD 1/12 showed TScores -0.1 in Spine, and -2.3 in [facility]; we recommended starting FOSAMAX $RemoveBeforeDE'70mg'RiCLugDLHXAqpOg$ /wk along w/ her Calcium, MVI, VitD 5000u (esp in light of her freq Pred use). ~  6/13:  Pt wanted to know if it was ok to stop her Evista since it is so expensive; she saw DrFontaine & he deferred to [place]- ok to stop Evista. ~  BMD 2/15 showed TScores +0.3 in Spine and -1.8 in [facility]- improved and rec to continue the Alendronate, calcium, MVI & VitD the same for now...  HEADACHE (ICD-784.0) - prev eval at the Headache clinic- 1994 by DrSpillman, and 2003 by DrFreeman...  Hx of MALIGNANT MELANOMA (ICD-172.9) - removed by [facility] in [facility] from medial left knee area...  Health Maintenance - she gets the seasonal Flu vaccine yearly... given f/u PNEUMOVAX at age 78 in 2009...   Past Surgical History  Procedure Laterality Date  . Left total hip replacement  2004    Dr. Gladstone Lighter  . Melanoma removed from medial rleft knee area  2006    Dr. [place] Crane  . Cystoscopy and basket stone removal right ureter  02/2006    Dr. Terance Hart  . Cardiac catheterization    . Breast surgery  2013    Breast Bx-Benign  . Nasal septum surgery    . Gynecologic cryosurgery  1971  . Cataract extraction, bilateral      Outpatient Encounter Prescriptions as of 06/27/2015  Medication Sig  . albuterol (PROAIR HFA) 108 (90 BASE) MCG/ACT inhaler Inhale 2 puffs into the lungs every 6 (six) hours as needed for wheezing.  Marland Kitchen albuterol (PROVENTIL) (2.5 MG/3ML) 0.083% nebulizer solution Take 3 mLs (2.5 mg total) by nebulization every 6 (six) hours as needed.  Marland Kitchen alendronate (FOSAMAX) 70 MG tablet Take 1 tablet (70 mg total) by mouth every 7 (seven) days. With a full glass of water on an empty stomach.  Marland Kitchen amLODipine (NORVASC) 10 MG tablet Take 1 tablet (10  mg total) by mouth daily.  . Ascorbic Acid (VITAMIN C) 500 MG tablet Take 500 mg by mouth daily.    Marland Kitchen aspirin 81 MG tablet Take 81 mg by mouth daily.    . budesonide-formoterol (SYMBICORT) 160-4.5 MCG/ACT inhaler Inhale 2 puffs into the lungs 2 (two) times daily.  . Calcium Carbonate (CALCIUM 600) 1500 MG TABS Take 1 tablet by mouth daily.    . Cholecalciferol (VITAMIN D3) 5000 UNITS CAPS Take 1 capsule by mouth daily.    . Cinnamon 500 MG TABS Take by mouth. With Chromium  . Coenzyme Q10 (COQ10) 100 MG CAPS Take 1 capsule by mouth daily.    Marland Kitchen desmopressin (DDAVP) 0.2 MG tablet Take 3 tablets by  mouth daily at bedtime   . furosemide (LASIX) 40 MG tablet Take 1 tablet (40 mg total) by mouth daily.  . hydrOXYzine (ATARAX/VISTARIL) 10 MG tablet Take 10 mg by mouth daily.  Marland Kitchen losartan (COZAAR) 100 MG tablet Take 1 tablet (100 mg total) by mouth daily.  . magnesium oxide (MAG-OX) 400 MG tablet Take 400 mg by mouth daily.    . Multiple Vitamin (MULTIVITAMIN) capsule Take 1 capsule by mouth daily.    Marland Kitchen omeprazole (PRILOSEC) 40 MG capsule TAKE 1 CAPSULE (40 MG TOTAL) BY MOUTH DAILY.  Marland Kitchen omeprazole (PRILOSEC) 40 MG capsule TAKE 1 CAPSULE (40 MG TOTAL) BY MOUTH DAILY.  Marland Kitchen OVER THE COUNTER MEDICATION Bone growth factor  . OVER THE COUNTER MEDICATION 3 oz daily. Nopolea  . OVER THE COUNTER MEDICATION Ocuvite 50 +  . OVER THE COUNTER MEDICATION Juice plus  . OVER THE COUNTER MEDICATION muscadine grape seed for arthritis  . pentosan polysulfate (ELMIRON) 100 MG capsule Take 100 mg by mouth 3 (three) times daily before meals.    . prazosin (MINIPRESS) 5 MG capsule Take 1 capsule (5 mg total) by mouth 2 (two) times daily.  Marland Kitchen PRESCRIPTION MEDICATION Allergy shots every week  . Probiotic Product (PROBIOTIC PO) 1 daily  . Red Yeast Rice Extract (RED YEAST RICE PO) Take by mouth 2 (two) times daily.  . simvastatin (ZOCOR) 20 MG tablet Take 1 tablet (20 mg total) by mouth at bedtime.  Marland Kitchen acyclovir (ZOVIRAX) 400 MG  tablet Take 400 mg by mouth 5 (five) times daily.   No facility-administered encounter medications on file as of 06/27/2015.    Allergies  Allergen Reactions  . Cephalexin     REACTION: high fever  . Penicillins     REACTION: severe swelling and hives  . Phenobarbital     REACTION: hives  . Tape     rash    Review of Systems         See HPI - all other systems neg except as noted... The patient complains of dyspnea on exertion.  The patient denies anorexia, fever, weight loss, weight gain, vision loss, decreased hearing, hoarseness, chest pain, syncope, peripheral edema, prolonged cough, headaches, hemoptysis, abdominal pain, melena, hematochezia, severe indigestion/heartburn, hematuria, incontinence, muscle weakness, suspicious skin lesions, transient blindness, difficulty walking, depression, unusual weight change, abnormal bleeding, enlarged lymph nodes, and angioedema.     Objective:   Physical Exam      WD, Overweight, 74 y/o WF in NAD... Vital Signs:  Reviewed... GENERAL:  Alert & oriented; pleasant & cooperative... HEENT:  [place]/AT, EOM-wnl, PERRLA, EACs-clear, TMs- some wax, NOSE-clear, THROAT- resolved, clear now. NECK:  Supple w/ fairROM; no JVD; normal carotid impulses w/o bruits; no thyromegaly or nodules palpated; no lymphadenopathy. CHEST:  essentially clear now- without wheezing, rales, or signs of consolidation. HEART:  Regular Rhythm; Gr1-2/6 SEM w/o rubs or gallops detected... ABDOMEN:  Obese, soft & nontender; normal bowel sounds; no organomegaly or masses palpated... EXT: without deformities, mild arthritic changes; no varicose veins/ +venous insuffic/ tr edema. NEURO:  CN's intact;  no focal neuro deficits... DERM:  No lesions noted; no rash etc...  RADIOLOGY DATA:  Reviewed in the EPIC EMR & discussed w/ the patient...  LABORATORY DATA:  Reviewed in the EPIC EMR & discussed w/ the patient...   Assessment & Plan:    ASTHMA>  On regimen as above, she  indicates doing well & hasn't needed Pred in yrs due to her supplements- "noplea" "juice plus" etc..Marland Kitchen  HBP>  Controlled on meds, continue same, get wt down...  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  CHOL> on Simva20, has added RYR she says, & FLP looks good...  GI>  Hx GERD stable on PPI Rx.  IC>  Followed by Urology & stable by her report...  DJD/ OSteopenia>  Now on Fosamax along w/ her Calcium, MVI, Vit D...  Other medical problems as noted...   Patient's Medications  New Prescriptions   CLOBETASOL CREAM (TEMOVATE) 0.05 %    Apply 1 application topically at bedtime as needed.  Previous Medications   ACYCLOVIR (ZOVIRAX) 400 MG TABLET    Take 400 mg by mouth 5 (five) times daily.   ALBUTEROL (PROAIR HFA) 108 (90 BASE) MCG/ACT INHALER    Inhale 2 puffs into the lungs every 6 (six) hours as needed for wheezing.   ALBUTEROL (PROVENTIL) (2.5 MG/3ML) 0.083% NEBULIZER SOLUTION    Take 3 mLs (2.5 mg total) by nebulization every 6 (six) hours as needed.   ALENDRONATE (FOSAMAX) 70 MG TABLET    Take 1 tablet (70 mg total) by mouth every 7 (seven) days. With a full glass of water on an empty stomach.   AMLODIPINE (NORVASC) 10 MG TABLET    Take 1 tablet (10 mg total) by mouth daily.   ASCORBIC ACID (VITAMIN C) 500 MG TABLET    Take 500 mg by mouth daily.     ASPIRIN 81 MG TABLET    Take 81 mg by mouth daily.     BUDESONIDE-FORMOTEROL (SYMBICORT) 160-4.5 MCG/ACT INHALER    Inhale 2 puffs into the lungs 2 (two) times daily.   CALCIUM CARBONATE (CALCIUM 600) 1500 MG TABS    Take 1 tablet by mouth daily.     CHOLECALCIFEROL (VITAMIN D3) 5000 UNITS CAPS    Take 1 capsule by mouth daily.     CINNAMON 500 MG TABS    Take by mouth. With Chromium   COENZYME Q10 (COQ10) 100 MG CAPS    Take 1 capsule by mouth daily.     DESMOPRESSIN (DDAVP) 0.2 MG TABLET    Take 3 tablets by mouth daily at bedtime    FUROSEMIDE (LASIX) 40 MG  TABLET    Take 1 tablet (40 mg total) by mouth daily.   HYDROXYZINE (ATARAX/VISTARIL) 10 MG TABLET    Take 10 mg by mouth daily.   LOSARTAN (COZAAR) 100 MG TABLET    Take 1 tablet (100 mg total) by mouth daily.   MAGNESIUM OXIDE (MAG-OX) 400 MG TABLET    Take 400 mg by mouth daily.     MULTIPLE VITAMIN (MULTIVITAMIN) CAPSULE    Take 1 capsule by mouth daily.     OMEPRAZOLE (PRILOSEC) 40 MG CAPSULE    TAKE 1 CAPSULE (40 MG TOTAL) BY MOUTH DAILY.   OVER THE COUNTER MEDICATION    Bone growth factor   OVER THE COUNTER MEDICATION    3 oz daily. Nopolea   OVER THE COUNTER MEDICATION    Ocuvite 50 +   OVER THE COUNTER MEDICATION    Juice plus   OVER THE COUNTER MEDICATION    muscadine grape seed for arthritis   PENTOSAN POLYSULFATE (ELMIRON) 100 MG CAPSULE    Take 100 mg by mouth 3 (three) times daily before meals.     PRAZOSIN (MINIPRESS) 5 MG CAPSULE    Take 1 capsule (5 mg total) by mouth 2 (two) times daily.  PRESCRIPTION MEDICATION    Allergy shots every week   PROBIOTIC PRODUCT (PROBIOTIC PO)    1 daily   RED YEAST RICE EXTRACT (RED YEAST RICE PO)    Take by mouth 2 (two) times daily.   SIMVASTATIN (ZOCOR) 20 MG TABLET    Take 1 tablet (20 mg total) by mouth at bedtime.  Modified Medications   No medications on file  Discontinued Medications   OMEPRAZOLE (PRILOSEC) 40 MG CAPSULE    TAKE 1 CAPSULE (40 MG TOTAL) BY MOUTH DAILY.

## 2015-07-03 ENCOUNTER — Other Ambulatory Visit (HOSPITAL_COMMUNITY)
Admission: RE | Admit: 2015-07-03 | Discharge: 2015-07-03 | Disposition: A | Discharge status: Home / Self Care | Payer: PPO | Source: Ambulatory Visit | Attending: Gynecology | Admitting: Gynecology

## 2015-07-03 ENCOUNTER — Encounter: Payer: Self-pay | Admitting: Gynecology

## 2015-07-03 ENCOUNTER — Ambulatory Visit (INDEPENDENT_AMBULATORY_CARE_PROVIDER_SITE_OTHER): Payer: PPO | Admitting: Gynecology

## 2015-07-03 VITALS — BP 124/80 | Ht 62.5 in | Wt 191.0 lb

## 2015-07-03 DIAGNOSIS — N952 Postmenopausal atrophic vaginitis: Secondary | ICD-10-CM

## 2015-07-03 DIAGNOSIS — L900 Lichen sclerosus et atrophicus: Secondary | ICD-10-CM

## 2015-07-03 DIAGNOSIS — M810 Age-related osteoporosis without current pathological fracture: Secondary | ICD-10-CM | POA: Diagnosis not present

## 2015-07-03 DIAGNOSIS — Z124 Encounter for screening for malignant neoplasm of cervix: Secondary | ICD-10-CM | POA: Diagnosis not present

## 2015-07-03 DIAGNOSIS — Z01419 Encounter for gynecological examination (general) (routine) without abnormal findings: Secondary | ICD-10-CM

## 2015-07-03 MED ORDER — CLOBETASOL PROPIONATE 0.05 % EX CREA: 1.0000 "application " | TOPICAL_CREAM | Freq: Every evening | CUTANEOUS | Status: DC | PRN

## 2015-07-03 NOTE — Patient Instructions (Signed)

## 2015-07-03 NOTE — Addendum Note (Signed)
Addended by: Nelva Nay on: 07/03/2015 02:58 PM   Modules accepted: Orders

## 2015-07-03 NOTE — Progress Notes (Signed)
Brianna Mccarty [birth year] [id number]        74 y.o.  [id number] for breast and pelvic exam.  Past medical history,surgical history, problem list, medications, allergies, family history and social history were all reviewed and documented as reviewed in the EPIC chart.  ROS:  Performed with pertinent positives and negatives included in the history, assessment and plan.   Additional significant findings :  none   Exam: [name] Vitals:   07/03/15 1426  BP: 124/80  Height: 5' 2.5" (1.588 m)  Weight: 191 lb (86.637 kg)   General appearance:  Normal affect, orientation and appearance. Skin: Grossly normal HEENT: Without gross lesions.  No cervical or supraclavicular adenopathy. Thyroid normal.  Lungs:  Clear without wheezing, rales or rhonchi Cardiac: RR, without RMG Abdominal:  Soft, nontender, without masses, guarding, rebound, organomegaly or hernia Breasts:  Examined lying and sitting without masses, retractions, discharge or axillary adenopathy. Pelvic:  Ext/BUS/vagina with atrophic changes. Some blanching of skin lateral to the posterior fourchette bilaterally consistent with lichen sclerosis.  Cervix with atrophic changes  Uterus axial to anteverted, normal size, shape and contour, midline and mobile nontender   Adnexa  Without masses or tenderness    Anus and perineum  Normal   Rectovaginal  Normal sphincter tone without palpated masses or tenderness.    Assessment/Plan:  74 y.o. [id number] female for breast and pelvic exam.   1. Postmenopausal/atrophic genital changes. Without significant hot flushes, night sweats, vaginal dryness. No vaginal bleeding. Continue to monitor and report any vaginal bleeding. 2. Lichen sclerosis.  Requests refill on her Temovate 0.05% cream. Uses occasionally for irritation. Refill 30 g provided. 3. Osteoporosis. Currently on Fosamax being followed by Dr. Maudie Mercury. Has appointment to see them in January and will follow up with them.  Will be  coming due for her DEXA beginning of next year. I again reminded patient to discuss with them about a drug-free holiday whenever they feel appropriate. 4. Pap smear 2012. Pap smear done today.  Does have history of cryosurgery 40 years ago. Normal Pap smears since then. 5. Mammography 12/2014. Continue with annual mammography. SBE monthly reviewed. 6. Colonoscopy 2016. Repeat at their recommended interval. 7. Health maintenance. No routine blood work done as this is done at Dr. Julianne Rice office. Follow up in one year, sooner as needed.   Anastasio Auerbach MD, 2:51 PM 07/03/2015

## 2015-07-04 ENCOUNTER — Ambulatory Visit (HOSPITAL_COMMUNITY): Payer: PPO | Attending: Pulmonary Disease

## 2015-07-04 ENCOUNTER — Other Ambulatory Visit: Payer: Self-pay

## 2015-07-04 DIAGNOSIS — I517 Cardiomegaly: Secondary | ICD-10-CM | POA: Insufficient documentation

## 2015-07-04 DIAGNOSIS — I059 Rheumatic mitral valve disease, unspecified: Secondary | ICD-10-CM | POA: Diagnosis not present

## 2015-07-05 LAB — CYTOLOGY - PAP

## 2015-08-02 ENCOUNTER — Encounter: Payer: Self-pay | Admitting: Family Medicine

## 2015-08-06 ENCOUNTER — Ambulatory Visit (INDEPENDENT_AMBULATORY_CARE_PROVIDER_SITE_OTHER): Payer: PPO | Admitting: Physician Assistant

## 2015-08-06 ENCOUNTER — Ambulatory Visit (INDEPENDENT_AMBULATORY_CARE_PROVIDER_SITE_OTHER): Payer: PPO

## 2015-08-06 VITALS — BP 136/72 | HR 75 | Temp 98.3°F | Resp 18 | Ht 63.0 in | Wt 187.0 lb

## 2015-08-06 DIAGNOSIS — M25629 Stiffness of unspecified elbow, not elsewhere classified: Secondary | ICD-10-CM | POA: Diagnosis not present

## 2015-08-06 DIAGNOSIS — M25531 Pain in right wrist: Secondary | ICD-10-CM

## 2015-08-06 DIAGNOSIS — M25521 Pain in right elbow: Secondary | ICD-10-CM

## 2015-08-06 DIAGNOSIS — S52121A Displaced fracture of head of right radius, initial encounter for closed fracture: Secondary | ICD-10-CM

## 2015-08-06 DIAGNOSIS — W19XXXA Unspecified fall, initial encounter: Secondary | ICD-10-CM | POA: Diagnosis not present

## 2015-08-06 NOTE — Progress Notes (Signed)
08/06/2015 at 2:53 PM  Brianna Mccarty / DOB: 1941-03-31 / MRN: [id number]  The patient has MALIGNANT MELANOMA; HYPERCHOLESTEROLEMIA; Essential hypertension; Venous (peripheral) insufficiency; Allergic rhinitis; BRONCHITIS, RECURRENT; Asthma; GERD; DIVERTICULOSIS OF COLON; INTERSTITIAL CYSTITIS; DEGENERATIVE JOINT DISEASE; OSTEOPOROSIS; HEADACHE; CHEST PAIN-UNSPECIFIED; RENAL CALCULUS, HX OF; Carotid artery disease ([place]); Abnormal EKG; Mitral valve disease; and Thyroid cyst on her problem list.  SUBJECTIVE  Brianna Mccarty is a 74 y.o. well appearing female presenting for the chief complaint of a fall on outstretched right hand last night sustained while gardening. Complains of mild pain and swelling in the right wrist and elbow. She is most concerned because she cannot fully extend her left elbow.  She has a history of osteopenia and has been taking fosamax, calcium and vitamin D with some improvement.    She  has a past medical history of Allergic rhinitis; Asthma; Bronchitis, mucopurulent recurrent ([place]); Hypertension; Chest pain, unspecified; Carotid artery disease ([place]); Venous insufficiency; Hypercholesterolemia; GERD (gastroesophageal reflux disease); Diverticulosis of colon; Interstitial cystitis; Renal calculus; DJD (degenerative joint disease); Osteoporosis; Headache(784.0); Malignant melanoma ([place]); Lichen sclerosus; Abnormal EKG; Ejection fraction; Mitral valve disease; Thyroid cyst; and Cervical dysplasia (1971).    Medications reviewed and updated by myself where necessary, and exist elsewhere in the encounter.   Ms. Lanting is allergic to cephalexin; penicillins; phenobarbital; and tape. She  reports that she has never smoked. She has never used smokeless tobacco. She reports that she drinks about 1.8 oz of alcohol per week. She reports that she does not use illicit drugs. She  reports that she does not engage in sexual activity. The patient  has past surgical history that includes left  total hip replacement (2004); melanoma removed from medial rleft knee area (2006); cystoscopy and basket stone removal right ureter (02/2006); Cardiac catheterization; Breast surgery (2013); Nasal septum surgery; Gynecologic cryosurgery (1971); and Cataract extraction, bilateral.  Her family history includes Cancer in her brother and father; Colon cancer in her paternal uncle; Diabetes in her brother, brother, and mother; Heart disease in her brother and mother; Hypertension in her brother, brother, sister, and sister.  Review of Systems  Constitutional: Negative for fever and chills.  Respiratory: Negative for cough.   Cardiovascular: Negative for chest pain.  Musculoskeletal: Positive for joint pain and falls. Negative for myalgias, back pain and neck pain.  Skin: Negative for rash.  Neurological: Negative for headaches.    OBJECTIVE  Her  height is 5\' 3"  (1.6 m) and weight is 187 lb (84.823 kg). Her oral temperature is 98.3 F (36.8 C). Her blood pressure is 136/72 and her pulse is 75. Her respiration is 18 and oxygen saturation is 99%.  The patient's body mass index is 33.13 kg/(m^2).  Physical Exam  Constitutional: She is oriented to person, place, and time. She appears well-developed and well-nourished. No distress.  Eyes: Pupils are equal, round, and reactive to light.  Cardiovascular: Normal rate.   Respiratory: Effort normal. No respiratory distress.  GI: She exhibits no distension.  Musculoskeletal:       Right elbow: She exhibits decreased range of motion, swelling and deformity. She exhibits no effusion and no laceration. No tenderness found. No radial head, no medial epicondyle, no lateral epicondyle and no olecranon process tenderness noted.       Right wrist: She exhibits swelling. She exhibits normal range of motion, no tenderness, no bony tenderness, no effusion and no deformity.       Arms: Neurological: She is alert and oriented to  person, place, and time. She has normal  strength. She displays no atrophy and no tremor. No cranial nerve deficit or sensory deficit. She exhibits normal muscle tone.  Skin: Skin is warm and dry. She is not diaphoretic.  Psychiatric: She has a normal mood and affect. Her behavior is normal. Judgment and thought content normal.    No results found for this or any previous visit (from the past 24 hour(s)).  [facility] reading (PRIMARY) by PA Clark: STAT read right elbow and right wrist please comment.   CLINICAL DATA: Initial encounter for pain after a fall last night.  EXAM: RIGHT WRIST - COMPLETE 3+ VIEW  COMPARISON: None.  FINDINGS: Degenerative changes about the radial aspect of the carpus and less so the radiocarpal articulation. No acute fracture or dislocation. Scaphoid intact.  IMPRESSION: Degenerative change, without acute osseous finding.   Electronically Signed  By: Brianna Mccarty M.D.  On: 08/06/2015 13:52  CLINICAL DATA: Pain following fall   ________________________________________________________________   EXAM: RIGHT ELBOW - COMPLETE 3+ VIEW  COMPARISON: None.  FINDINGS: Frontal, oblique, and lateral views obtained. There is a fracture of the radial head extending to the dorsal aspect of the proximal radial metaphysis with alignment near anatomic. There is no other appreciable fracture. No dislocation. There is a joint effusion. There is no appreciable joint space narrowing. There is soft tissue swelling.  IMPRESSION: Fracture radial head extending to proximal radial metaphysis with alignment near anatomic. Joint effusion present. There is soft tissue swelling. No dislocation.   Electronically Signed  By: Brianna Mccarty M.D.  On: 08/06/2015 13:54   ASSESSMENT & PLAN  Brianna Mccarty was seen today for fall, arm injury, hand injury and edema.  Diagnoses and all orders for this visit:  Fall, initial encounter: Sugar tong splint applied in the office along with arm  sling. Amb referral to orthopedics to be seen early next week.  Her pain is well managed with Aleve bid.  Advised the addition of tylenol.  Advised the splint remain in place at all times aside from bathing, during which she is to leave the arm in place.    Right wrist pain -     DG Wrist Complete Right; Future  Right elbow pain -     Cancel: DG Elbow Complete Right; Future -     DG Elbow Complete Right; Future  Decreased ROM of elbow -     DG Wrist Complete Right; Future  Radial head fracture, closed, right, initial encounter -     Ambulatory referral to Orthopedic Surgery    The patient was advised to call or come back to clinic if she does not see an improvement in symptoms, or worsens with the above plan.   Philis Fendt, MHS, PA-C [facility] 08/06/2015 2:53 PM

## 2015-08-09 ENCOUNTER — Encounter: Payer: PPO | Admitting: Cardiology

## 2015-10-20 ENCOUNTER — Ambulatory Visit (INDEPENDENT_AMBULATORY_CARE_PROVIDER_SITE_OTHER): Payer: PPO | Admitting: Cardiology

## 2015-10-20 ENCOUNTER — Encounter: Payer: Self-pay | Admitting: Cardiology

## 2015-10-20 VITALS — BP 130/64 | HR 55 | Ht 63.0 in | Wt 187.0 lb

## 2015-10-20 DIAGNOSIS — E7800 Pure hypercholesterolemia, unspecified: Secondary | ICD-10-CM | POA: Diagnosis not present

## 2015-10-20 DIAGNOSIS — R9431 Abnormal electrocardiogram [ECG] [EKG]: Secondary | ICD-10-CM | POA: Diagnosis not present

## 2015-10-20 DIAGNOSIS — R011 Cardiac murmur, unspecified: Secondary | ICD-10-CM | POA: Insufficient documentation

## 2015-10-20 DIAGNOSIS — I739 Peripheral vascular disease, unspecified: Secondary | ICD-10-CM

## 2015-10-20 DIAGNOSIS — I779 Disorder of arteries and arterioles, unspecified: Secondary | ICD-10-CM

## 2015-10-20 DIAGNOSIS — I10 Essential (primary) hypertension: Secondary | ICD-10-CM

## 2015-10-20 HISTORY — DX: Cardiac murmur, unspecified: R01.1

## 2015-10-20 LAB — LIPID PANEL
Cholesterol: 221 mg/dL — ABNORMAL HIGH (ref 125–200)
HDL: 119 mg/dL (ref >=46)
LDL Cholesterol: 93 mg/dL (ref <130)
Total CHOL/HDL Ratio: 1.9 Ratio (ref <=5.0)
Triglycerides: 43 mg/dL (ref <150)
VLDL: 9 mg/dL (ref <30)

## 2015-10-20 LAB — COMPREHENSIVE METABOLIC PANEL
ALT: 13 U/L (ref 6–29)
AST: 16 U/L (ref 10–35)
Albumin: 3.8 g/dL (ref 3.6–5.1)
Alkaline Phosphatase: 56 U/L (ref 33–130)
BUN: 21 mg/dL (ref 7–25)
CO2: 29 mmol/L (ref 20–31)
Calcium: 9.2 mg/dL (ref 8.6–10.4)
Chloride: 101 mmol/L (ref 98–110)
Creat: 0.78 mg/dL (ref 0.60–0.93)
Glucose, Bld: 87 mg/dL (ref 65–99)
Potassium: 4.3 mmol/L (ref 3.5–5.3)
Sodium: 138 mmol/L (ref 135–146)
Total Bilirubin: 0.4 mg/dL (ref 0.2–1.2)
Total Protein: 6.5 g/dL (ref 6.1–8.1)

## 2015-10-20 LAB — CK: Total CK: 175 U/L (ref 7–177)

## 2015-10-20 LAB — MAGNESIUM: Magnesium: 1.8 mg/dL (ref 1.5–2.5)

## 2015-10-20 LAB — TSH: TSH: 2.301 u[IU]/mL (ref 0.350–4.500)

## 2015-10-20 MED ORDER — PRAVASTATIN SODIUM 20 MG PO TABS: 20.0000 mg | ORAL_TABLET | Freq: Every evening | ORAL | Status: DC

## 2015-10-20 NOTE — Patient Instructions (Signed)
Medication Instructions:   STOP SIMVASTATIN NOW  STOP RED YEAST RICE NOW  START PRAVASTATIN 20 MG ONCE DAILY   Labwork:  TODAY---CMET, MAGNESIUM, TSH, LIPIDS, AND CPK  LAB IN 2 MONTHS AS WELL ---TO CHECK A CMET    Follow-Up:  Your physician wants you to follow-up in: [place] will receive a reminder letter in the mail two months in advance. If you don't receive a letter, please call our office to schedule the follow-up appointment.     If you need a refill on your cardiac medications before your next appointment, please call your pharmacy.

## 2015-10-20 NOTE — Progress Notes (Signed)
Patient Brianna: Brianna Mccarty, female   DOB: [birth year], 74 y.o.   MRN: [id number]      Cardiology Office Note   Date:  10/20/2015   Brianna:  JIWOO Mccarty, DOB January 26, 1941, MRN [id number]  PCP:  Lucretia Kern., DO  Cardiologist:  Dorothy Spark, MD   Chief complain: Follow up for HTN, HLP   History of Present Illness: [name] is a 74 y.o. female who presents for follow up after 4 years. Former patient of Dr Ron Parker. She has been doing well and remains active, she has been using a lot of supplements and asks advice about them. No chest pain, DOE, no palpitations, dizziness or syncope, no LE edema, orthopnea, PND. She stopped using simvastatin 2 months ago as she developed muscle cramps, those haven't resolved since then. Otherwise complaint with her meds.  Past Medical History  Diagnosis Date  . Allergic rhinitis   . Asthma   . Bronchitis, mucopurulent recurrent ([place])   . Hypertension   . Chest pain, unspecified   . Carotid artery disease ([place])     Doppler, April, 2009, 0-39% bilateral  . Venous insufficiency   . Hypercholesterolemia   . GERD (gastroesophageal reflux disease)   . Diverticulosis of colon   . Interstitial cystitis   . Renal calculus   . DJD (degenerative joint disease)   . Osteoporosis   . Headache(784.0)   . Malignant melanoma ([place])   . Lichen sclerosus   . Abnormal EKG     Normal LV function in the past  . Ejection fraction     EF 60%, echo, 2009  . Mitral valve disease     Question mitral valve prolapse in the past, no prolapse by echo 2009  . Thyroid cyst     1 x 1.1 thyroid cyst noted on carotid Doppler, January, 2012  . Cervical dysplasia 1971    Past Surgical History  Procedure Laterality Date  . Left total hip replacement  2004    Dr. Gladstone Lighter  . Melanoma removed from medial rleft knee area  2006    Dr. [place] Crane  . Cystoscopy and basket stone removal right ureter  02/2006    Dr. Terance Hart  . Cardiac catheterization    . Breast surgery  2013      Breast Bx-Benign  . Nasal septum surgery    . Gynecologic cryosurgery  1971  . Cataract extraction, bilateral       Current Outpatient Prescriptions  Medication Sig Dispense Refill  . albuterol (PROAIR HFA) 108 (90 BASE) MCG/ACT inhaler Inhale 2 puffs into the lungs every 6 (six) hours as needed for wheezing. 3 Inhaler 0  . albuterol (PROVENTIL) (2.5 MG/3ML) 0.083% nebulizer solution Take 3 mLs (2.5 mg total) by nebulization every 6 (six) hours as needed. 360 mL 0  . alendronate (FOSAMAX) 70 MG tablet Take 1 tablet (70 mg total) by mouth every 7 (seven) days. With a full glass of water on an empty stomach. 12 tablet 3  . amLODipine (NORVASC) 10 MG tablet Take 1 tablet (10 mg total) by mouth daily. 90 tablet 3  . Ascorbic Acid (VITAMIN C) 500 MG tablet Take 500 mg by mouth daily.      Marland Kitchen aspirin 81 MG tablet Take 81 mg by mouth daily.      . budesonide-formoterol (SYMBICORT) 160-4.5 MCG/ACT inhaler Inhale 2 puffs into the lungs 2 (two) times daily. 1 Inhaler 3  . Calcium Carbonate (CALCIUM 600) 1500 MG TABS Take 1  tablet by mouth daily.      . Cholecalciferol (VITAMIN D3) 5000 UNITS CAPS Take 1 capsule by mouth daily.      . Cinnamon 500 MG TABS Take by mouth. With Chromium    . clobetasol cream (TEMOVATE) [id number] % Apply 1 application topically at bedtime as needed. 30 g 1  . Coenzyme Q10 (COQ10) 100 MG CAPS Take 1 capsule by mouth daily.      Marland Kitchen desmopressin (DDAVP) 0.2 MG tablet Take 3 tablets by mouth daily at bedtime     . furosemide (LASIX) 40 MG tablet Take 1 tablet (40 mg total) by mouth daily. 90 tablet 3  . hydrOXYzine (ATARAX/VISTARIL) 10 MG tablet Take 10 mg by mouth daily.    Marland Kitchen losartan (COZAAR) 100 MG tablet Take 1 tablet (100 mg total) by mouth daily. 90 tablet 3  . magnesium oxide (MAG-OX) 400 MG tablet Take 400 mg by mouth daily.      . Multiple Vitamin (MULTIVITAMIN) capsule Take 1 capsule by mouth daily.      Marland Kitchen omeprazole (PRILOSEC) 40 MG capsule TAKE 1 CAPSULE (40 MG  TOTAL) BY MOUTH DAILY. 30 capsule 3  . OVER THE COUNTER MEDICATION Bone growth factor    . OVER THE COUNTER MEDICATION 3 oz daily. [place]    . OVER THE COUNTER MEDICATION Ocuvite 50 +    . OVER THE COUNTER MEDICATION Juice plus    . pentosan polysulfate (ELMIRON) 100 MG capsule Take 100 mg by mouth 3 (three) times daily before meals.      . prazosin (MINIPRESS) 5 MG capsule Take 1 capsule (5 mg total) by mouth 2 (two) times daily. 180 capsule 3  . PRESCRIPTION MEDICATION Allergy shots every week    . Probiotic Product (PROBIOTIC PO) 1 daily    . Red Yeast Rice Extract (RED YEAST RICE PO) Take by mouth 2 (two) times daily.    . simvastatin (ZOCOR) 20 MG tablet Take 1 tablet (20 mg total) by mouth at bedtime. 90 tablet 3  . acyclovir (ZOVIRAX) 400 MG tablet Take 400 mg by mouth daily as needed. Reported on 10/20/2015     No current facility-administered medications for this visit.    Allergies:   Cephalexin; Penicillins; Phenobarbital; and Tape    Social History:  The patient  reports that she has never smoked. She has never used smokeless tobacco. She reports that she drinks about 1.8 oz of alcohol per week. She reports that she does not use illicit drugs.   Family History:  The patient's family history includes Cancer in her brother and father; Colon cancer in her paternal uncle; Diabetes in her brother, brother, and mother; Heart disease in her brother and mother; Hypertension in her brother, brother, sister, and sister.    ROS:  Please see the history of present illness.  All other systems are reviewed and negative.    PHYSICAL EXAM: VS:  BP 130/64 mmHg  Pulse 55  Ht 5\' 3"  (1.6 m)  Wt 187 lb (84.823 kg)  BMI 33.13 kg/m2 , BMI Body mass index is 33.13 kg/(m^2). GEN: Well nourished, well developed, in no acute distress HEENT: normal Neck: no JVD, carotid bruits, or masses Cardiac: RRR; no murmurs, rubs, or gallops,no edema  Respiratory:  clear to auscultation bilaterally, normal  work of breathing GI: soft, nontender, nondistended, + BS MS: no deformity or atrophy Skin: warm and dry, no rash Neuro:  Strength and sensation are intact Psych: euthymic mood, full affect  EKG:  SRlow voltage ECG, LAFB, unchanged from prior ECG.  Recent Labs: 10/25/2014: ALT 23; BUN 21; Creatinine, Ser 0.7; Hemoglobin 13.1; Platelets 246.0; Potassium 4.3; Sodium 141; TSH 2.63    Lipid Panel    Component Value Date/Time   CHOL 185 10/25/2014 0929   TRIG 37.0 10/25/2014 0929   HDL 71.00 10/25/2014 0929   CHOLHDL 3 10/25/2014 0929   VLDL 7.4 10/25/2014 0929   LDLCALC 107* 10/25/2014 0929   LDLDIRECT 126.6 07/18/2010 1045   Wt Readings from Last 3 Encounters:  10/20/15 187 lb (84.823 kg)  08/06/15 187 lb (84.823 kg)  07/03/15 191 lb (86.637 kg)    TTE: 07/04/2015 Left ventricle: The cavity size was normal. Systolic function was normal. The estimated ejection fraction was in the range of 60% to 65%. Wall motion was normal; there were no regional wall motion abnormalities. - Mitral valve: Moderately calcified annulus. Valve area by continuity equation (using LVOT flow): 1.63 cm^2. - Left atrium: The atrium was severely dilated. - Atrial septum: No defect or patent foramen ovale was identified.   ASSESSMENT AND PLAN:  1. Hypertension - well controlled, continue the same regimen, check CMP  2. Hyperlipidemia - d/c red yeast rice, simvastatin, start pravastatin, repeat CMP and lipids in 2 months, advised not to take red yeast rice with statins.  3. Carotid disease - minimal plaque on carotid US in 05/2015, follow up in 2 years.   Signed, Dorothy Spark, MD  10/20/2015 10:39 AM    [facility] [address], [place], [place]  [address] Phone: [phone]; Fax: [phone]   STOP SIMVASTATIN NOW  STOP RED YEAST RICE NOW  START PRAVASTATIN 20 MG ONCE DAILY   Labwork:  TODAY---CMET, MAGNESIUM, TSH, LIPIDS, AND CPK  LAB IN 2  MONTHS AS WELL ---TO CHECK A CMET    Follow-Up:  Your physician wants you to follow-up in: [place] will receive a reminder letter in the mail two months in advance. If you don't receive a letter, please call our office to schedule the follow-up appointment.

## 2015-10-23 ENCOUNTER — Telehealth: Payer: Self-pay | Admitting: Family Medicine

## 2015-10-23 NOTE — Telephone Encounter (Signed)
She had a booster in 2010. Should be good unless cut on something dirty or soiled - then should get booster of the tetanus only.

## 2015-10-23 NOTE — Telephone Encounter (Signed)
Ms. Koetz called saying she cut herself with a box cutter that had some rust on it. She's wondering if she needs to make an appt to have a tetanus shot. She said it's probably been about ten years since her last one. She'd like a phone call regarding this.  Pt's ph# [phone] or [phone]) Thank you.

## 2015-10-23 NOTE — Telephone Encounter (Signed)
I called the pt and she stated the box cutter was dirty and I scheduled her for the injection tomorrow at 2pm.

## 2015-10-24 ENCOUNTER — Ambulatory Visit (INDEPENDENT_AMBULATORY_CARE_PROVIDER_SITE_OTHER): Payer: PPO | Admitting: *Deleted

## 2015-10-24 DIAGNOSIS — S6990XA Unspecified injury of unspecified wrist, hand and finger(s), initial encounter: Secondary | ICD-10-CM | POA: Diagnosis not present

## 2015-10-24 DIAGNOSIS — Z23 Encounter for immunization: Secondary | ICD-10-CM | POA: Diagnosis not present

## 2015-11-08 DIAGNOSIS — Z08 Encounter for follow-up examination after completed treatment for malignant neoplasm: Secondary | ICD-10-CM | POA: Diagnosis not present

## 2015-11-08 DIAGNOSIS — Z1283 Encounter for screening for malignant neoplasm of skin: Secondary | ICD-10-CM | POA: Diagnosis not present

## 2015-11-08 DIAGNOSIS — L905 Scar conditions and fibrosis of skin: Secondary | ICD-10-CM | POA: Diagnosis not present

## 2015-11-08 DIAGNOSIS — Z85820 Personal history of malignant melanoma of skin: Secondary | ICD-10-CM | POA: Diagnosis not present

## 2015-11-09 DIAGNOSIS — J301 Allergic rhinitis due to pollen: Secondary | ICD-10-CM | POA: Diagnosis not present

## 2015-11-09 DIAGNOSIS — J3089 Other allergic rhinitis: Secondary | ICD-10-CM | POA: Diagnosis not present

## 2015-11-16 DIAGNOSIS — J3089 Other allergic rhinitis: Secondary | ICD-10-CM | POA: Diagnosis not present

## 2015-11-16 DIAGNOSIS — J301 Allergic rhinitis due to pollen: Secondary | ICD-10-CM | POA: Diagnosis not present

## 2015-11-21 DIAGNOSIS — N3010 Interstitial cystitis (chronic) without hematuria: Secondary | ICD-10-CM | POA: Diagnosis not present

## 2015-11-21 DIAGNOSIS — Z0000 Encounter for general adult medical examination without abnormal findings: Secondary | ICD-10-CM | POA: Diagnosis not present

## 2015-11-22 DIAGNOSIS — J3089 Other allergic rhinitis: Secondary | ICD-10-CM | POA: Diagnosis not present

## 2015-11-22 DIAGNOSIS — J301 Allergic rhinitis due to pollen: Secondary | ICD-10-CM | POA: Diagnosis not present

## 2015-11-27 ENCOUNTER — Ambulatory Visit (INDEPENDENT_AMBULATORY_CARE_PROVIDER_SITE_OTHER): Payer: PPO | Admitting: Family Medicine

## 2015-11-27 ENCOUNTER — Encounter: Payer: Self-pay | Admitting: Family Medicine

## 2015-11-27 VITALS — BP 132/80 | HR 72 | Temp 97.9°F | Ht 63.0 in | Wt 187.2 lb

## 2015-11-27 DIAGNOSIS — J4530 Mild persistent asthma, uncomplicated: Secondary | ICD-10-CM

## 2015-11-27 DIAGNOSIS — M810 Age-related osteoporosis without current pathological fracture: Secondary | ICD-10-CM

## 2015-11-27 DIAGNOSIS — I779 Disorder of arteries and arterioles, unspecified: Secondary | ICD-10-CM | POA: Diagnosis not present

## 2015-11-27 DIAGNOSIS — I872 Venous insufficiency (chronic) (peripheral): Secondary | ICD-10-CM | POA: Diagnosis not present

## 2015-11-27 DIAGNOSIS — Z87442 Personal history of urinary calculi: Secondary | ICD-10-CM | POA: Diagnosis not present

## 2015-11-27 DIAGNOSIS — J3089 Other allergic rhinitis: Secondary | ICD-10-CM | POA: Diagnosis not present

## 2015-11-27 DIAGNOSIS — E7800 Pure hypercholesterolemia, unspecified: Secondary | ICD-10-CM

## 2015-11-27 DIAGNOSIS — K219 Gastro-esophageal reflux disease without esophagitis: Secondary | ICD-10-CM

## 2015-11-27 DIAGNOSIS — Z0000 Encounter for general adult medical examination without abnormal findings: Secondary | ICD-10-CM | POA: Diagnosis not present

## 2015-11-27 DIAGNOSIS — I10 Essential (primary) hypertension: Secondary | ICD-10-CM | POA: Diagnosis not present

## 2015-11-27 DIAGNOSIS — N3010 Interstitial cystitis (chronic) without hematuria: Secondary | ICD-10-CM | POA: Diagnosis not present

## 2015-11-27 DIAGNOSIS — Z7689 Persons encountering health services in other specified circumstances: Secondary | ICD-10-CM

## 2015-11-27 DIAGNOSIS — I739 Peripheral vascular disease, unspecified: Secondary | ICD-10-CM

## 2015-11-27 DIAGNOSIS — J301 Allergic rhinitis due to pollen: Secondary | ICD-10-CM

## 2015-11-27 DIAGNOSIS — C439 Malignant melanoma of skin, unspecified: Secondary | ICD-10-CM

## 2015-11-27 DIAGNOSIS — Z8619 Personal history of other infectious and parasitic diseases: Secondary | ICD-10-CM

## 2015-11-27 DIAGNOSIS — Z7189 Other specified counseling: Secondary | ICD-10-CM

## 2015-11-27 MED ORDER — BUDESONIDE-FORMOTEROL FUMARATE 160-4.5 MCG/ACT IN AERO: 2.0000 | INHALATION_SPRAY | Freq: Two times a day (BID) | RESPIRATORY_TRACT | Status: DC

## 2015-11-27 MED ORDER — ACYCLOVIR 400 MG PO TABS: ORAL_TABLET | ORAL | Status: DC

## 2015-11-27 NOTE — Progress Notes (Signed)
HPI:  Brianna Mccarty is here to establish care. She had extensive labs done with Dr. Hassell Done about 1 month ago. Last PCP and physical: 06/2015 with Dr. Phineas Real  Has the following chronic problems that require follow up and concerns today:  HTN: -meds: losartan, prazosin (reports on this since age 75), lasix, norvasc, asa -just did labs last months  HLD/ carotid artery disease: -sees cardiologist - Dr. Meda Coffee -meds: asa, statin - advised by cardiologist to not take red yeast rice - she does not take this any longer and cramps are better  Asthma: -Sees Dr. Lenna Gilford in pulm -Meds: Symbicort, albuterol -thinks Juice Plus and Nopolea  IC/hx of nephrolithias: -chronic -sees urologist, Dr. Junious Silk -she has been having some symptoms since taking bactrim -meds: hydroxyzine, desmopressin, elmiron -no hematuria - had UA ast week with urologist  Allergies: -sees Dr. [name] -getting shots  Osteoporosis: -has been on fosomax for > 5 years and is interested in stopping this medication due to concern for side effects -one bone fx due to mechanical fall -no issues with falls or balance otherwise and does get regular wt bearing exercise and takes vit D  GERD: -chronic -takes prilosec 20mg  - is anxious about taking this, has bad ascid reflux if stops -no dysphagia  -hx of stricture and dilation  Hx Melanoma: -sees Dr. Nevada Crane yearly -hx melanoma on L inner knee  Cold sores: -uses acyclovir episodic tx - needs refill   ROS negative for unless reported above: fevers, unintentional weight loss, hearing or vision loss, chest pain, palpitations, struggling to breath, hemoptysis, melena, hematochezia, hematuria, falls, loc, si, thoughts of self harm  Past Medical History  Diagnosis Date  . Allergic rhinitis   . Asthma   . Bronchitis, mucopurulent recurrent ([place])   . Hypertension   . Chest pain, unspecified   . Carotid artery disease ([place])     Doppler, April, 2009, 0-39%  bilateral  . Venous insufficiency   . Hypercholesterolemia   . GERD (gastroesophageal reflux disease)   . Diverticulosis of colon   . Interstitial cystitis     sees urologist  . Renal calculus     sees urologist  . DJD (degenerative joint disease)   . Osteoporosis     on fosomax > 5 years, stopped 11/2015  . Headache(784.0)   . Malignant melanoma ([place])     sees Dr. Nevada Crane in dermatology  . Lichen sclerosus   . Abnormal EKG     Normal LV function in the past  . Ejection fraction     EF 60%, echo, 2009  . Mitral valve disease     Question mitral valve prolapse in the past, no prolapse by echo 2009  . Thyroid cyst     1 x 1.1 thyroid cyst noted on carotid Doppler, January, 2012  . Cervical dysplasia 1971  . Murmur 10/20/2015  . Migraines     Past Surgical History  Procedure Laterality Date  . Left total hip replacement  2004    Dr. Gladstone Lighter  . Melanoma removed from medial rleft knee area  2006    Dr. Nevada Crane  . Cystoscopy and basket stone removal right ureter  02/2006    Dr. Terance Hart  . Cardiac catheterization    . Breast surgery  2013    Breast Bx-Benign  . Nasal septum surgery    . Gynecologic cryosurgery  1971  . Cataract extraction, bilateral      Family History  Problem Relation Age of Onset  .  Cancer Father     Pancreatic  . Diabetes Mother   . Heart disease Mother   . Cancer Brother     Bile duct  . Diabetes Brother   . Hypertension Brother   . Diabetes Brother   . Heart disease Brother   . Hypertension Brother   . Hypertension Sister   . Hypertension Sister   . Colon cancer Paternal Uncle     Social History   Social History  . Marital Status: Married    Spouse Name: Edison Nasuti  . Number of Children: 2  . Years of Education: N/A   Occupational History  . retired    Social History Main Topics  . Smoking status: Never Smoker   . Smokeless tobacco: Never Used  . Alcohol Use: 1.8 oz/week    3 Standard drinks or equivalent per week     Comment: social  use  . Drug Use: No  . Sexual Activity: No     Comment: 1st intercourse 26 yo-1 partner   Other Topics Concern  . None   Social History Narrative   Updated 11/2015   Work or School: none      Home Situation: lives with husband      Spiritual Beliefs: christian      Lifestyle: regular exercise; healthy diet           Current outpatient prescriptions:  .  acyclovir (ZOVIRAX) 400 MG tablet, 400mg  tid x5 days for outbreak, Disp: 15 tablet, Rfl: 3 .  albuterol (PROAIR HFA) 108 (90 BASE) MCG/ACT inhaler, Inhale 2 puffs into the lungs every 6 (six) hours as needed for wheezing., Disp: 3 Inhaler, Rfl: 0 .  albuterol (PROVENTIL) (2.5 MG/3ML) 0.083% nebulizer solution, Take 3 mLs (2.5 mg total) by nebulization every 6 (six) hours as needed., Disp: 360 mL, Rfl: 0 .  amLODipine (NORVASC) 10 MG tablet, Take 1 tablet (10 mg total) by mouth daily., Disp: 90 tablet, Rfl: 3 .  Ascorbic Acid (VITAMIN C) 500 MG tablet, Take 500 mg by mouth daily.  , Disp: , Rfl:  .  aspirin 81 MG tablet, Take 81 mg by mouth daily.  , Disp: , Rfl:  .  budesonide-formoterol (SYMBICORT) 160-4.5 MCG/ACT inhaler, Inhale 2 puffs into the lungs 2 (two) times daily., Disp: 3 Inhaler, Rfl: 3 .  Calcium Carbonate (CALCIUM 600) 1500 MG TABS, Take 1 tablet by mouth daily.  , Disp: , Rfl:  .  Cholecalciferol (VITAMIN D3) 5000 UNITS CAPS, Take 1 capsule by mouth daily.  , Disp: , Rfl:  .  Cinnamon 500 MG TABS, Take by mouth. With Chromium, Disp: , Rfl:  .  clobetasol cream (TEMOVATE) [id number] %, Apply 1 application topically at bedtime as needed., Disp: 30 g, Rfl: 1 .  Coenzyme Q10 (COQ10) 100 MG CAPS, Take 1 capsule by mouth daily.  , Disp: , Rfl:  .  desmopressin (DDAVP) 0.2 MG tablet, Take 3 tablets by mouth daily at bedtime , Disp: , Rfl:  .  furosemide (LASIX) 40 MG tablet, Take 1 tablet (40 mg total) by mouth daily., Disp: 90 tablet, Rfl: 3 .  hydrOXYzine (ATARAX/VISTARIL) 10 MG tablet, Take 10 mg by mouth daily., Disp: , Rfl:   .  losartan (COZAAR) 100 MG tablet, Take 1 tablet (100 mg total) by mouth daily., Disp: 90 tablet, Rfl: 3 .  magnesium oxide (MAG-OX) 400 MG tablet, Take 400 mg by mouth daily.  , Disp: , Rfl:  .  Multiple Vitamin (MULTIVITAMIN) capsule, Take 1  capsule by mouth daily.  , Disp: , Rfl:  .  omeprazole (PRILOSEC) 40 MG capsule, TAKE 1 CAPSULE (40 MG TOTAL) BY MOUTH DAILY., Disp: 30 capsule, Rfl: 3 .  OVER THE COUNTER MEDICATION, Bone growth factor, Disp: , Rfl:  .  OVER THE COUNTER MEDICATION, 3 oz daily. Nopolea, Disp: , Rfl:  .  OVER THE COUNTER MEDICATION, Ocuvite 50 +, Disp: , Rfl:  .  OVER THE COUNTER MEDICATION, Juice plus, Disp: , Rfl:  .  pentosan polysulfate (ELMIRON) 100 MG capsule, Take 100 mg by mouth 3 (three) times daily before meals.  , Disp: , Rfl:  .  pravastatin (PRAVACHOL) 20 MG tablet, Take 1 tablet (20 mg total) by mouth every evening., Disp: 90 tablet, Rfl: 3 .  prazosin (MINIPRESS) 5 MG capsule, Take 1 capsule (5 mg total) by mouth 2 (two) times daily., Disp: 180 capsule, Rfl: 3 .  PRESCRIPTION MEDICATION, Allergy shots every week, Disp: , Rfl:  .  Probiotic Product (PROBIOTIC PO), 1 daily, Disp: , Rfl:   EXAM:  Filed Vitals:   11/27/15 1121  BP: 132/80  Pulse: 72  Temp: 97.9 F (36.6 C)    Body mass index is 33.17 kg/(m^2).  GENERAL: vitals reviewed and listed above, alert, oriented, appears well hydrated and in no acute distress  HEENT: atraumatic, conjunttiva clear, no obvious abnormalities on inspection of external nose and ears  NECK: no obvious masses on inspection  LUNGS: clear to auscultation bilaterally, no wheezes, rales or rhonchi, good air movement  CV: HRRR, SEM, no peripheral edema  MS: moves all extremities without noticeable abnormality  PSYCH: pleasant and cooperative, no obvious depression or anxiety  ASSESSMENT AND PLAN:  Spent > 40 minutes with this patient with > 50% face to face in consultation. Discussed the following assessment  and plan:  Essential hypertension HYPERCHOLESTEROLEMIA Bilateral carotid artery disease (HCC) Venous (peripheral) insufficiency -continue current treatments, discussed statins as she had concerns about this - her rare leg cramps that occur at night are not likely statin induced - but agree with no red yeast rice and change of statin -lifestyle recs  Allergic rhinitis due to pollen Asthma, mild persistent, uncomplicated -refilled medication -stable  Gastroesophageal reflux disease without esophagitis -discussed risks/benefits low dose PPI and lifestyle recs -opted opted to continue low dose PPI  INTERSTITIAL CYSTITIS And hx nephrolithiasis -she has difficulty distinguishing, saw urology recently, advised follow up w/ urologist if persistent symptoms, she agrees to call  H/O cold sores -refilled episodic tx  Melanoma of skin ([place]) -advised importance of regular skin check which she is doing  Osteoporosis: -on fosomax> 5 years, normal range on most recent DEXA in last 2 years -she is concerned about risks with medication and wants to stop - after discussion risks benefits she opted to stop and recheck bony DEXA in 2 years -in interim continue Vit D3, adequate dietary calcium and regular wt bearing exercise   Establishing care with new doctor, encounter for -We reviewed the PMH, PSH, FH, SH, Meds and Allergies. -We provided refills for any medications we will prescribe as needed. -We addressed current concerns per orders and patient instructions. -We have asked for records for pertinent exams, studies, vaccines and notes from previous providers. -We have advised patient to follow up per instructions below.   -Patient advised to return or notify a doctor immediately if symptoms worsen or persist or new concerns arise.  Patient Instructions  BEFORE YOU LEAVE: -schedule follow up in 6 months  We  recommend the following healthy lifestyle measures: - eat a healthy whole foods  diet consisting of regular small meals composed of vegetables, fruits, beans, nuts, seeds, healthy meats such as white chicken and fish and whole grains.  - avoid sweets, white starchy foods, fried foods, fast food, processed foods, sodas, red meet and other fattening foods.  - get a least 150-300 minutes of aerobic exercise per week.       Colin Benton R.

## 2015-11-27 NOTE — Progress Notes (Signed)
Pre visit review using our clinic review tool, if applicable. No additional management support is needed unless otherwise documented below in the visit note. 

## 2015-11-27 NOTE — Patient Instructions (Signed)
BEFORE YOU LEAVE: -schedule follow up in 6 months  We recommend the following healthy lifestyle measures: - eat a healthy whole foods diet consisting of regular small meals composed of vegetables, fruits, beans, nuts, seeds, healthy meats such as white chicken and fish and whole grains.  - avoid sweets, white starchy foods, fried foods, fast food, processed foods, sodas, red meet and other fattening foods.  - get a least 150-300 minutes of aerobic exercise per week.   

## 2015-11-28 DIAGNOSIS — H6123 Impacted cerumen, bilateral: Secondary | ICD-10-CM | POA: Diagnosis not present

## 2015-11-30 DIAGNOSIS — Z0000 Encounter for general adult medical examination without abnormal findings: Secondary | ICD-10-CM | POA: Diagnosis not present

## 2015-11-30 DIAGNOSIS — Z87442 Personal history of urinary calculi: Secondary | ICD-10-CM | POA: Diagnosis not present

## 2015-11-30 DIAGNOSIS — N3010 Interstitial cystitis (chronic) without hematuria: Secondary | ICD-10-CM | POA: Diagnosis not present

## 2015-11-30 DIAGNOSIS — N23 Unspecified renal colic: Secondary | ICD-10-CM | POA: Diagnosis not present

## 2015-12-04 ENCOUNTER — Other Ambulatory Visit: Payer: Self-pay | Admitting: Family Medicine

## 2015-12-04 DIAGNOSIS — J301 Allergic rhinitis due to pollen: Secondary | ICD-10-CM | POA: Diagnosis not present

## 2015-12-04 DIAGNOSIS — J3089 Other allergic rhinitis: Secondary | ICD-10-CM | POA: Diagnosis not present

## 2015-12-19 DIAGNOSIS — J3089 Other allergic rhinitis: Secondary | ICD-10-CM | POA: Diagnosis not present

## 2015-12-19 DIAGNOSIS — J301 Allergic rhinitis due to pollen: Secondary | ICD-10-CM | POA: Diagnosis not present

## 2015-12-19 DIAGNOSIS — Z1231 Encounter for screening mammogram for malignant neoplasm of breast: Secondary | ICD-10-CM | POA: Diagnosis not present

## 2015-12-21 ENCOUNTER — Other Ambulatory Visit (INDEPENDENT_AMBULATORY_CARE_PROVIDER_SITE_OTHER): Payer: PPO | Admitting: *Deleted

## 2015-12-21 ENCOUNTER — Encounter: Payer: Self-pay | Admitting: Gynecology

## 2015-12-21 DIAGNOSIS — E7800 Pure hypercholesterolemia, unspecified: Secondary | ICD-10-CM | POA: Diagnosis not present

## 2015-12-21 DIAGNOSIS — I10 Essential (primary) hypertension: Secondary | ICD-10-CM

## 2015-12-21 DIAGNOSIS — R011 Cardiac murmur, unspecified: Secondary | ICD-10-CM | POA: Diagnosis not present

## 2015-12-21 DIAGNOSIS — R9431 Abnormal electrocardiogram [ECG] [EKG]: Secondary | ICD-10-CM | POA: Diagnosis not present

## 2015-12-21 LAB — COMPREHENSIVE METABOLIC PANEL
ALT: 14 U/L (ref 6–29)
AST: 19 U/L (ref 10–35)
Albumin: 3.9 g/dL (ref 3.6–5.1)
Alkaline Phosphatase: 55 U/L (ref 33–130)
BUN: 15 mg/dL (ref 7–25)
CO2: 29 mmol/L (ref 20–31)
Calcium: 9.9 mg/dL (ref 8.6–10.4)
Chloride: 103 mmol/L (ref 98–110)
Creat: 1.14 mg/dL — ABNORMAL HIGH (ref 0.60–0.93)
Glucose, Bld: 127 mg/dL — ABNORMAL HIGH (ref 65–99)
Potassium: 3.8 mmol/L (ref 3.5–5.3)
Sodium: 140 mmol/L (ref 135–146)
Total Bilirubin: 0.3 mg/dL (ref 0.2–1.2)
Total Protein: 6.9 g/dL (ref 6.1–8.1)

## 2015-12-22 ENCOUNTER — Telehealth: Payer: Self-pay | Admitting: *Deleted

## 2015-12-22 MED ORDER — FUROSEMIDE 20 MG PO TABS: 20.0000 mg | ORAL_TABLET | Freq: Every day | ORAL | Status: DC

## 2015-12-22 NOTE — Telephone Encounter (Signed)
-----   Message from Dorothy Spark, MD sent at 12/22/2015 11:18 AM EST ----- I would cut lasix in half to 20 mg po daily as her Crea is slightly elevated, otherwise stable labs.

## 2015-12-22 NOTE — Telephone Encounter (Signed)
Notified the pt that per Dr Meda Coffee her labs showed that her creatinine is slightly elevated, but otherwise stable labs.  Informed the pt that per Dr Meda Coffee, she recommends that with the elevated creatinine, we should cut her lasix in 1/2 to 20 mg po daily.  Confirmed the pharmacy of choice with the pt.  Pt verbalized understanding and agrees with this plan.

## 2015-12-28 DIAGNOSIS — J3089 Other allergic rhinitis: Secondary | ICD-10-CM | POA: Diagnosis not present

## 2015-12-28 DIAGNOSIS — J301 Allergic rhinitis due to pollen: Secondary | ICD-10-CM | POA: Diagnosis not present

## 2016-01-04 DIAGNOSIS — J301 Allergic rhinitis due to pollen: Secondary | ICD-10-CM | POA: Diagnosis not present

## 2016-01-04 DIAGNOSIS — J3089 Other allergic rhinitis: Secondary | ICD-10-CM | POA: Diagnosis not present

## 2016-01-09 DIAGNOSIS — J3089 Other allergic rhinitis: Secondary | ICD-10-CM | POA: Diagnosis not present

## 2016-01-09 DIAGNOSIS — J301 Allergic rhinitis due to pollen: Secondary | ICD-10-CM | POA: Diagnosis not present

## 2016-01-14 DIAGNOSIS — R6889 Other general symptoms and signs: Secondary | ICD-10-CM | POA: Diagnosis not present

## 2016-01-14 DIAGNOSIS — Z20828 Contact with and (suspected) exposure to other viral communicable diseases: Secondary | ICD-10-CM | POA: Diagnosis not present

## 2016-01-18 DIAGNOSIS — J209 Acute bronchitis, unspecified: Secondary | ICD-10-CM | POA: Diagnosis not present

## 2016-01-20 ENCOUNTER — Other Ambulatory Visit: Payer: Self-pay | Admitting: Family

## 2016-01-22 ENCOUNTER — Other Ambulatory Visit: Payer: Self-pay | Admitting: Family

## 2016-01-29 ENCOUNTER — Ambulatory Visit (INDEPENDENT_AMBULATORY_CARE_PROVIDER_SITE_OTHER): Payer: PPO | Admitting: Ophthalmology

## 2016-01-30 ENCOUNTER — Ambulatory Visit: Payer: PPO | Admitting: Family Medicine

## 2016-02-05 DIAGNOSIS — J301 Allergic rhinitis due to pollen: Secondary | ICD-10-CM | POA: Diagnosis not present

## 2016-02-05 DIAGNOSIS — J3089 Other allergic rhinitis: Secondary | ICD-10-CM | POA: Diagnosis not present

## 2016-02-07 ENCOUNTER — Ambulatory Visit (INDEPENDENT_AMBULATORY_CARE_PROVIDER_SITE_OTHER): Payer: PPO | Admitting: Ophthalmology

## 2016-02-07 DIAGNOSIS — H33303 Unspecified retinal break, bilateral: Secondary | ICD-10-CM

## 2016-02-07 DIAGNOSIS — H35372 Puckering of macula, left eye: Secondary | ICD-10-CM | POA: Diagnosis not present

## 2016-02-07 DIAGNOSIS — D3131 Benign neoplasm of right choroid: Secondary | ICD-10-CM | POA: Diagnosis not present

## 2016-02-07 DIAGNOSIS — H35033 Hypertensive retinopathy, bilateral: Secondary | ICD-10-CM

## 2016-02-07 DIAGNOSIS — D3132 Benign neoplasm of left choroid: Secondary | ICD-10-CM | POA: Diagnosis not present

## 2016-02-07 DIAGNOSIS — H43813 Vitreous degeneration, bilateral: Secondary | ICD-10-CM | POA: Diagnosis not present

## 2016-02-07 DIAGNOSIS — I10 Essential (primary) hypertension: Secondary | ICD-10-CM | POA: Diagnosis not present

## 2016-02-07 DIAGNOSIS — H35341 Macular cyst, hole, or pseudohole, right eye: Secondary | ICD-10-CM | POA: Diagnosis not present

## 2016-02-09 DIAGNOSIS — J3089 Other allergic rhinitis: Secondary | ICD-10-CM | POA: Diagnosis not present

## 2016-02-09 DIAGNOSIS — J301 Allergic rhinitis due to pollen: Secondary | ICD-10-CM | POA: Diagnosis not present

## 2016-02-14 DIAGNOSIS — J3089 Other allergic rhinitis: Secondary | ICD-10-CM | POA: Diagnosis not present

## 2016-02-14 DIAGNOSIS — J301 Allergic rhinitis due to pollen: Secondary | ICD-10-CM | POA: Diagnosis not present

## 2016-02-20 DIAGNOSIS — J301 Allergic rhinitis due to pollen: Secondary | ICD-10-CM | POA: Diagnosis not present

## 2016-02-20 DIAGNOSIS — J3089 Other allergic rhinitis: Secondary | ICD-10-CM | POA: Diagnosis not present

## 2016-02-27 DIAGNOSIS — J301 Allergic rhinitis due to pollen: Secondary | ICD-10-CM | POA: Diagnosis not present

## 2016-02-27 DIAGNOSIS — J3089 Other allergic rhinitis: Secondary | ICD-10-CM | POA: Diagnosis not present

## 2016-03-04 DIAGNOSIS — J301 Allergic rhinitis due to pollen: Secondary | ICD-10-CM | POA: Diagnosis not present

## 2016-03-04 DIAGNOSIS — J3089 Other allergic rhinitis: Secondary | ICD-10-CM | POA: Diagnosis not present

## 2016-03-11 DIAGNOSIS — J3089 Other allergic rhinitis: Secondary | ICD-10-CM | POA: Diagnosis not present

## 2016-03-11 DIAGNOSIS — J301 Allergic rhinitis due to pollen: Secondary | ICD-10-CM | POA: Diagnosis not present

## 2016-03-20 DIAGNOSIS — J301 Allergic rhinitis due to pollen: Secondary | ICD-10-CM | POA: Diagnosis not present

## 2016-03-20 DIAGNOSIS — J3089 Other allergic rhinitis: Secondary | ICD-10-CM | POA: Diagnosis not present

## 2016-04-03 DIAGNOSIS — J3089 Other allergic rhinitis: Secondary | ICD-10-CM | POA: Diagnosis not present

## 2016-04-03 DIAGNOSIS — J301 Allergic rhinitis due to pollen: Secondary | ICD-10-CM | POA: Diagnosis not present

## 2016-04-04 DIAGNOSIS — J301 Allergic rhinitis due to pollen: Secondary | ICD-10-CM | POA: Diagnosis not present

## 2016-04-04 DIAGNOSIS — J3089 Other allergic rhinitis: Secondary | ICD-10-CM | POA: Diagnosis not present

## 2016-04-16 DIAGNOSIS — J3089 Other allergic rhinitis: Secondary | ICD-10-CM | POA: Diagnosis not present

## 2016-04-16 DIAGNOSIS — J301 Allergic rhinitis due to pollen: Secondary | ICD-10-CM | POA: Diagnosis not present

## 2016-04-19 ENCOUNTER — Other Ambulatory Visit: Payer: Self-pay | Admitting: Family Medicine

## 2016-04-19 DIAGNOSIS — J301 Allergic rhinitis due to pollen: Secondary | ICD-10-CM | POA: Diagnosis not present

## 2016-04-19 DIAGNOSIS — J4540 Moderate persistent asthma, uncomplicated: Secondary | ICD-10-CM | POA: Diagnosis not present

## 2016-04-19 DIAGNOSIS — H1045 Other chronic allergic conjunctivitis: Secondary | ICD-10-CM | POA: Diagnosis not present

## 2016-04-19 DIAGNOSIS — J3089 Other allergic rhinitis: Secondary | ICD-10-CM | POA: Diagnosis not present

## 2016-04-19 NOTE — Telephone Encounter (Signed)
Rx refill sent to pharmacy. 

## 2016-04-22 ENCOUNTER — Other Ambulatory Visit: Payer: Self-pay | Admitting: Family Medicine

## 2016-04-25 DIAGNOSIS — J3089 Other allergic rhinitis: Secondary | ICD-10-CM | POA: Diagnosis not present

## 2016-04-25 DIAGNOSIS — J301 Allergic rhinitis due to pollen: Secondary | ICD-10-CM | POA: Diagnosis not present

## 2016-04-29 DIAGNOSIS — J3089 Other allergic rhinitis: Secondary | ICD-10-CM | POA: Diagnosis not present

## 2016-04-29 DIAGNOSIS — J301 Allergic rhinitis due to pollen: Secondary | ICD-10-CM | POA: Diagnosis not present

## 2016-05-02 DIAGNOSIS — J3089 Other allergic rhinitis: Secondary | ICD-10-CM | POA: Diagnosis not present

## 2016-05-02 DIAGNOSIS — J301 Allergic rhinitis due to pollen: Secondary | ICD-10-CM | POA: Diagnosis not present

## 2016-05-06 DIAGNOSIS — M25511 Pain in right shoulder: Secondary | ICD-10-CM | POA: Diagnosis not present

## 2016-05-06 DIAGNOSIS — M75111 Incomplete rotator cuff tear or rupture of right shoulder, not specified as traumatic: Secondary | ICD-10-CM | POA: Diagnosis not present

## 2016-05-06 DIAGNOSIS — M533 Sacrococcygeal disorders, not elsewhere classified: Secondary | ICD-10-CM | POA: Diagnosis not present

## 2016-05-08 DIAGNOSIS — J301 Allergic rhinitis due to pollen: Secondary | ICD-10-CM | POA: Diagnosis not present

## 2016-05-08 DIAGNOSIS — J3089 Other allergic rhinitis: Secondary | ICD-10-CM | POA: Diagnosis not present

## 2016-05-24 DIAGNOSIS — J301 Allergic rhinitis due to pollen: Secondary | ICD-10-CM | POA: Diagnosis not present

## 2016-05-24 DIAGNOSIS — J3089 Other allergic rhinitis: Secondary | ICD-10-CM | POA: Diagnosis not present

## 2016-05-27 ENCOUNTER — Ambulatory Visit: Payer: PPO | Admitting: Family Medicine

## 2016-05-27 ENCOUNTER — Telehealth: Payer: Self-pay | Admitting: Family Medicine

## 2016-05-27 DIAGNOSIS — Z96642 Presence of left artificial hip joint: Secondary | ICD-10-CM | POA: Diagnosis not present

## 2016-05-27 DIAGNOSIS — E7800 Pure hypercholesterolemia, unspecified: Secondary | ICD-10-CM | POA: Diagnosis not present

## 2016-05-27 DIAGNOSIS — Z881 Allergy status to other antibiotic agents status: Secondary | ICD-10-CM | POA: Diagnosis not present

## 2016-05-27 DIAGNOSIS — Z79899 Other long term (current) drug therapy: Secondary | ICD-10-CM | POA: Diagnosis not present

## 2016-05-27 DIAGNOSIS — E785 Hyperlipidemia, unspecified: Secondary | ICD-10-CM | POA: Diagnosis not present

## 2016-05-27 DIAGNOSIS — Z888 Allergy status to other drugs, medicaments and biological substances status: Secondary | ICD-10-CM | POA: Diagnosis not present

## 2016-05-27 DIAGNOSIS — E669 Obesity, unspecified: Secondary | ICD-10-CM | POA: Diagnosis not present

## 2016-05-27 DIAGNOSIS — Z880 Allergy status to penicillin: Secondary | ICD-10-CM | POA: Diagnosis not present

## 2016-05-27 DIAGNOSIS — J45909 Unspecified asthma, uncomplicated: Secondary | ICD-10-CM | POA: Diagnosis not present

## 2016-05-27 DIAGNOSIS — I10 Essential (primary) hypertension: Secondary | ICD-10-CM | POA: Diagnosis not present

## 2016-05-27 NOTE — Telephone Encounter (Signed)
Pt states she is in [place] helping her daughter move.  Pt states her BP his running 155/80 and yesterday 169/80.  Pt would like to know if Dr Maudie Mercury would ok to temporarily increase her BP medicine?

## 2016-05-27 NOTE — Telephone Encounter (Signed)
I called the pt and informed her of the message below and she stated she will call back for an appt if needed. 

## 2016-05-27 NOTE — Telephone Encounter (Signed)
She is already on fairly high doses of her meds. She will need evaluation for adjustment in medications. If symptomatic, continues to run high or any concerns advise [facility] where she is visiting and follow up here when she returns.

## 2016-05-30 ENCOUNTER — Encounter: Payer: Self-pay | Admitting: Family Medicine

## 2016-05-30 ENCOUNTER — Ambulatory Visit (INDEPENDENT_AMBULATORY_CARE_PROVIDER_SITE_OTHER): Payer: PPO | Admitting: Family Medicine

## 2016-05-30 VITALS — BP 122/70 | HR 88 | Temp 97.5°F | Ht 63.0 in | Wt 186.7 lb

## 2016-05-30 DIAGNOSIS — R011 Cardiac murmur, unspecified: Secondary | ICD-10-CM

## 2016-05-30 DIAGNOSIS — I872 Venous insufficiency (chronic) (peripheral): Secondary | ICD-10-CM | POA: Diagnosis not present

## 2016-05-30 DIAGNOSIS — I779 Disorder of arteries and arterioles, unspecified: Secondary | ICD-10-CM | POA: Diagnosis not present

## 2016-05-30 DIAGNOSIS — Z6833 Body mass index (BMI) 33.0-33.9, adult: Secondary | ICD-10-CM

## 2016-05-30 DIAGNOSIS — I10 Essential (primary) hypertension: Secondary | ICD-10-CM

## 2016-05-30 DIAGNOSIS — J301 Allergic rhinitis due to pollen: Secondary | ICD-10-CM | POA: Diagnosis not present

## 2016-05-30 DIAGNOSIS — I739 Peripheral vascular disease, unspecified: Secondary | ICD-10-CM

## 2016-05-30 DIAGNOSIS — J3089 Other allergic rhinitis: Secondary | ICD-10-CM | POA: Diagnosis not present

## 2016-05-30 NOTE — Patient Instructions (Signed)
BEFORE YOU LEAVE: -follow up: in next 3 months for wellness visit with susan and follow up with Dr. Maudie Mercury - morning appt and come fasting if possible -labs for kidney check only - will plan to do other labs at wellness visit  Continue to monitor BP a few days per week.  Continue current medications.  We recommend the following healthy lifestyle: 1) Small portions - eat off of salad plate instead of dinner plate 2) Eat a healthy clean diet with avoidance of (less then 1 serving per week) processed foods, sweetened drinks, white starches, red meat, fast foods and sweets and consisting of: * 5-9 servings per day of fresh or frozen fruits and vegetables (not corn or potatoes, not dried or canned) *nuts and seeds, beans *olives and olive oil *small portions of lean meats such as fish and white chicken  *small portions of whole grains 3)Get at least 150 minutes of sweaty aerobic exercise per week 4)reduce stress - counseling, meditation, relaxation to balance other aspects of your life

## 2016-05-30 NOTE — Progress Notes (Signed)
HPI:  Brianna Mccarty is a pleasant 75 yo with a PMH of HTN, carotid art dz, HLD, dilated atrium (sees cardiologist), asthma (sees Dr. Lenna Gilford), GERD, allergies, IC/nephrolithiasis (sees Dr. Junious Silk), Osteoporosis and melanoma (sees Dr. Nevada Crane) here for an acute visit for elevated Bp. She was visiting daughter out of state and was very stressed and check BP and was running higher on the 150-170s/ over 80s so she went to an emergency room. She had no symptoms so they gave her clonidine to take prn. She has not needed it as home BPs in the 1teens-130s/60-70s on average. Denies CP, SOB, DOE, HA, dizziness, vision changes. Her cardiologist lowered her lasix to 1/2 does due to renal issues on 40 mg. She has mildly increased LE edema on lower dose.  ROS: See pertinent positives and negatives per HPI.  Past Medical History:  Diagnosis Date  . Abnormal EKG    Normal LV function in the past  . Allergic rhinitis   . Asthma   . Bronchitis, mucopurulent recurrent ([place])   . Carotid artery disease ([place])    Doppler, April, 2009, 0-39% bilateral  . Cervical dysplasia 1971  . Chest pain, unspecified   . Diverticulosis of colon   . DJD (degenerative joint disease)   . Ejection fraction    EF 60%, echo, 2009  . GERD (gastroesophageal reflux disease)   . Headache(784.0)   . Hypercholesterolemia   . Hypertension   . Interstitial cystitis    sees urologist  . Lichen sclerosus   . Malignant melanoma ([place])    sees Dr. Nevada Crane in dermatology  . Migraines   . Mitral valve disease    Question mitral valve prolapse in the past, no prolapse by echo 2009  . Murmur 10/20/2015  . Osteoporosis    on fosomax > 5 years, stopped 11/2015  . Renal calculus    sees urologist  . Thyroid cyst    1 x 1.1 thyroid cyst noted on carotid Doppler, January, 2012  . Venous insufficiency     Past Surgical History:  Procedure Laterality Date  . BREAST SURGERY  2013   Breast Bx-Benign  . CARDIAC CATHETERIZATION    . CATARACT  EXTRACTION, BILATERAL    . cystoscopy and basket stone removal right ureter  02/2006   Dr. Terance Hart  . GYNECOLOGIC CRYOSURGERY  1971  . left total hip replacement  2004   Dr. Gladstone Lighter  . melanoma removed from medial rleft knee area  2006   Dr. Nevada Crane  . NASAL SEPTUM SURGERY      Family History  Problem Relation Age of Onset  . Cancer Father     Pancreatic  . Diabetes Mother   . Heart disease Mother   . Cancer Brother     Bile duct  . Diabetes Brother   . Hypertension Brother   . Diabetes Brother   . Heart disease Brother   . Hypertension Brother   . Hypertension Sister   . Hypertension Sister   . Colon cancer Paternal Uncle     Social History   Social History  . Marital status: Married    Spouse name: Edison Nasuti  . Number of children: 2  . Years of education: N/A   Occupational History  . retired Retired   Social History Main Topics  . Smoking status: Never Smoker  . Smokeless tobacco: Never Used  . Alcohol use 1.8 oz/week    3 Standard drinks or equivalent per week     Comment: social  use  . Drug use: No  . Sexual activity: No     Comment: 1st intercourse 16 yo-1 partner   Other Topics Concern  . None   Social History Narrative   Updated 11/2015   Work or School: none      Home Situation: lives with husband      Spiritual Beliefs: christian      Lifestyle: regular exercise; healthy diet           Current Outpatient Prescriptions:  .  acyclovir (ZOVIRAX) 400 MG tablet, 400mg  tid x5 days for outbreak, Disp: 15 tablet, Rfl: 3 .  albuterol (PROAIR HFA) 108 (90 BASE) MCG/ACT inhaler, Inhale 2 puffs into the lungs every 6 (six) hours as needed for wheezing., Disp: 3 Inhaler, Rfl: 0 .  albuterol (PROVENTIL) (2.5 MG/3ML) 0.083% nebulizer solution, Take 3 mLs (2.5 mg total) by nebulization every 6 (six) hours as needed., Disp: 360 mL, Rfl: 0 .  amLODipine (NORVASC) 10 MG tablet, TAKE 1 TABLET BY MOUTH EVERY DAY, Disp: 90 tablet, Rfl: 0 .  Ascorbic Acid (VITAMIN  C) 500 MG tablet, Take 500 mg by mouth daily.  , Disp: , Rfl:  .  aspirin 81 MG tablet, Take 81 mg by mouth daily.  , Disp: , Rfl:  .  budesonide-formoterol (SYMBICORT) 160-4.5 MCG/ACT inhaler, Inhale 2 puffs into the lungs 2 (two) times daily., Disp: 3 Inhaler, Rfl: 3 .  Calcium Carbonate (CALCIUM 600) 1500 MG TABS, Take 1 tablet by mouth daily.  , Disp: , Rfl:  .  Cholecalciferol (VITAMIN D3) 5000 UNITS CAPS, Take 1 capsule by mouth daily.  , Disp: , Rfl:  .  Cinnamon 500 MG TABS, Take by mouth. With Chromium, Disp: , Rfl:  .  clobetasol cream (TEMOVATE) [id number] %, Apply 1 application topically at bedtime as needed., Disp: 30 g, Rfl: 1 .  Coenzyme Q10 (COQ10) 100 MG CAPS, Take 1 capsule by mouth daily.  , Disp: , Rfl:  .  desmopressin (DDAVP) 0.2 MG tablet, Take 3 tablets by mouth daily at bedtime , Disp: , Rfl:  .  furosemide (LASIX) 20 MG tablet, Take 1 tablet (20 mg total) by mouth daily., Disp: 90 tablet, Rfl: 3 .  hydrOXYzine (ATARAX/VISTARIL) 10 MG tablet, Take 10 mg by mouth daily., Disp: , Rfl:  .  losartan (COZAAR) 100 MG tablet, TAKE 1 TABLET (100 MG TOTAL) BY MOUTH DAILY., Disp: 90 tablet, Rfl: 1 .  magnesium oxide (MAG-OX) 400 MG tablet, Take 400 mg by mouth daily.  , Disp: , Rfl:  .  Multiple Vitamin (MULTIVITAMIN) capsule, Take 1 capsule by mouth daily.  , Disp: , Rfl:  .  omeprazole (PRILOSEC) 40 MG capsule, TAKE 1 CAPSULE (40 MG TOTAL) BY MOUTH DAILY., Disp: 30 capsule, Rfl: 3 .  OVER THE COUNTER MEDICATION, Bone growth factor, Disp: , Rfl:  .  OVER THE COUNTER MEDICATION, 3 oz daily. Nopolea, Disp: , Rfl:  .  OVER THE COUNTER MEDICATION, Ocuvite 50 +, Disp: , Rfl:  .  OVER THE COUNTER MEDICATION, Juice plus, Disp: , Rfl:  .  pentosan polysulfate (ELMIRON) 100 MG capsule, Take 100 mg by mouth 3 (three) times daily before meals.  , Disp: , Rfl:  .  pravastatin (PRAVACHOL) 20 MG tablet, Take 1 tablet (20 mg total) by mouth every evening., Disp: 90 tablet, Rfl: 3 .  prazosin  (MINIPRESS) 5 MG capsule, TAKE 1 CAPSULE (5 MG TOTAL) BY MOUTH 2 (TWO) TIMES DAILY., Disp: 180 capsule, Rfl: 1 .  PRESCRIPTION MEDICATION, Allergy shots every week, Disp: , Rfl:  .  Probiotic Product (PROBIOTIC PO), 1 daily, Disp: , Rfl:   EXAM:  Vitals:   05/30/16 1358  BP: 122/70  Pulse: 88  Temp: 97.5 F (36.4 C)    Body mass index is 33.07 kg/m.  GENERAL: vitals reviewed and listed above, alert, oriented, appears well hydrated and in no acute distress  HEENT: atraumatic, conjunttiva clear, no obvious abnormalities on inspection of external nose and ears  NECK: no obvious masses on inspection  LUNGS: clear to auscultation bilaterally, no wheezes, rales or rhonchi, good air movement  CV: HRRR, no peripheral edema  MS: moves all extremities without noticeable abnormality  PSYCH: pleasant and cooperative, no obvious depression or anxiety  ASSESSMENT AND PLAN:  Discussed the following assessment and plan:  Essential hypertension - Plan: Basic metabolic panel  Bilateral carotid artery disease (HCC)  Murmur  Venous (peripheral) insufficiency  BMI 33.0-33.9,adult  -BP on recheck today is excellent -will check BMP -continued monitoring at home and lifestyle recs -follow up with wellness visit and she plans to do other labs then -Patient advised to return or notify a doctor immediately if symptoms worsen or persist or new concerns arise.  Patient Instructions  BEFORE YOU LEAVE: -follow up: in next 3 months for wellness visit with susan and follow up with Dr. Maudie Mercury - morning appt and come fasting if possible -labs for kidney check only - will plan to do other labs at wellness visit  Continue to monitor BP a few days per week.  Continue current medications.  We recommend the following healthy lifestyle: 1) Small portions - eat off of salad plate instead of dinner plate 2) Eat a healthy clean diet with avoidance of (less then 1 serving per week) processed foods,  sweetened drinks, white starches, red meat, fast foods and sweets and consisting of: * 5-9 servings per day of fresh or frozen fruits and vegetables (not corn or potatoes, not dried or canned) *nuts and seeds, beans *olives and olive oil *small portions of lean meats such as fish and white chicken  *small portions of whole grains 3)Get at least 150 minutes of sweaty aerobic exercise per week 4)reduce stress - counseling, meditation, relaxation to balance other aspects of your life     Colin Benton R., DO

## 2016-05-30 NOTE — Progress Notes (Signed)
Pre visit review using our clinic review tool, if applicable. No additional management support is needed unless otherwise documented below in the visit note. 

## 2016-06-04 DIAGNOSIS — J301 Allergic rhinitis due to pollen: Secondary | ICD-10-CM | POA: Diagnosis not present

## 2016-06-04 DIAGNOSIS — J3089 Other allergic rhinitis: Secondary | ICD-10-CM | POA: Diagnosis not present

## 2016-06-18 DIAGNOSIS — J3089 Other allergic rhinitis: Secondary | ICD-10-CM | POA: Diagnosis not present

## 2016-06-18 DIAGNOSIS — J301 Allergic rhinitis due to pollen: Secondary | ICD-10-CM | POA: Diagnosis not present

## 2016-06-25 DIAGNOSIS — J301 Allergic rhinitis due to pollen: Secondary | ICD-10-CM | POA: Diagnosis not present

## 2016-06-25 DIAGNOSIS — J3089 Other allergic rhinitis: Secondary | ICD-10-CM | POA: Diagnosis not present

## 2016-07-03 ENCOUNTER — Encounter: Payer: Self-pay | Admitting: Pulmonary Disease

## 2016-07-03 ENCOUNTER — Ambulatory Visit (INDEPENDENT_AMBULATORY_CARE_PROVIDER_SITE_OTHER): Payer: PPO | Admitting: Pulmonary Disease

## 2016-07-03 VITALS — BP 140/72 | HR 60 | Temp 97.4°F | Ht 63.0 in | Wt 191.1 lb

## 2016-07-03 DIAGNOSIS — J301 Allergic rhinitis due to pollen: Secondary | ICD-10-CM

## 2016-07-03 DIAGNOSIS — J3089 Other allergic rhinitis: Secondary | ICD-10-CM | POA: Diagnosis not present

## 2016-07-03 DIAGNOSIS — J4520 Mild intermittent asthma, uncomplicated: Secondary | ICD-10-CM | POA: Diagnosis not present

## 2016-07-03 DIAGNOSIS — Z23 Encounter for immunization: Secondary | ICD-10-CM

## 2016-07-03 NOTE — Patient Instructions (Signed)
Today we updated your med list in our EPIC system...    Continue your current medications the same...  Stay as active as possible...  Call for any questions...  Let's plan a follow up visit in 52yr, sooner if needed for any breathing roblems.Marland KitchenMarland Kitchen

## 2016-07-03 NOTE — Progress Notes (Signed)
Subjective:    Patient ID: Brianna Mccarty, female    DOB: 1941/05/31, 75 y.o.   MRN: [id number]  HPI 75 y/o WF here for a follow up visit... she has hx refractory asthmatic bronchitis requiring hosp & IV Solumedrol & max bronchodilator Rx etc... in the outpt setting she gets frequent courses of Prednisone & we have endeavored to maximize her inhaled regimen so as to keep her out of the hosp & off the oral steroids as much as poss... she also sees DrESL/VanWinkle for allergy shots, and she has seen DrKozlow in the past who felt that she had a major component of reflux causing her refractory airways dis...  SEE PREV EPIC NOTES FOR EARLIER DATA >>  ~  April 22, 2012:  73moROV & Brianna Mccarty to do satis from the pulm standpoint w/o exacerbation;  She had left breast biopsy 1/13 that turned out benign- prob fibroadenoma;  She continues on Nopalea supplement w/ it's anti-inflamm properties;  She saw DrFontaine 22/83>hx lichen sclerosis- uses Temovate cream & doing well, he deferred Osteoporosis testing & follow up to [place]& she is noted to be on both Evista & Alendronate; she wants to stop the Evista due to expense & this is ok w/ me;  She also saw DrKatz for Cards f/u 5/13> doing well & no changes made...     We reviewed prob list, meds, xrays and labs> see below>>  ~  October 21, 2012:  668moOV & Ercell notes doing well w/o recent issues, no new complaints or concerns... We reviewed the following medical problems during today's office visit >> reminded to bring all meds to every office visit...    AR, Asthma, Bronchitis> on NEBS w/ Albut, Symbicort80, Allegra180; she has last Asthma exac 8/13- treated by DrSharma w/ Depo/ Pred/ ZPak/ & incr Symbicort to 160; she improved & ret to baseline but didn't bring meds to this OV...    HBP, HxCP> on ASA81, Amlod10, Losar100, Minipress5, Lasix40;  BP=144/80 & she denies CP, palpit, SOB, edema; [facility] HBP, CP, SOB; doing satis & no  changes made...    Cerebrovasc dis> on ASA81; she had f/u CDopplers 1/12 showing stable mild heterogeneous plaque in bulbs & 0-39% ICA stenoses (incidental 1cm right thyroid cyst).    VenInsuffic> on low sodium, elevation, support hose...    Chol> on Simva20; FLP shows TChol 152, TG 34, HDL 64, LDL 81    GI- HH, GERD, Divertics> on Prev30bid & probiotic daily; she denies abd pain, dysphagia, n/v, c/d, blood seen; known 4cEtnalast colon 9/10 by DrStark w/ divertics only, but there is a FamHx colon ca in uncle, therefore f/u 5y89yr.    GU- IC, KidStones> on DDAVP & Elmiron100Tid; she is s/p DMSO treatments; states voiding is fine, denies recent leakage, incont, etc; last stone was 2007...    DJD, Osteopenia> on Norco5, Alendronate70/wk, calcium, MVI, VitD; she is s/p leftTHR by DrGioffre2004; [facility]12 [facility]    HAs> she is s/p eval in HA clinics 1994 & 2003...    HxMelanoma> [facility] We reviewed prob list, meds, xrays and labs> see below for updates >> she had the 2013 Flu vaccine 10/13; she has requested refill prescriptions today...  LABS 12/13:  FLP- at goals on Simva20;  Chems- wnl;  CBC- wnl;  TSH=2.43;  VitD=41   ~  May 18, 2013:  18mo81mo &  Brianna Mccarty has had a good interval- mild allergy symptoms in the spring, doing well overall & they cut back her allergy shots to every other wk; she still credits much of her improvement to Nopolea juice that she drinks daily for the last 19yr- "it's good for inflammation & helps my asthma"...     Asthma is stable on Symbicort80- taking one puff Bid & ProventilHFA vs NEB prn...     BP looks good on Amlod10, Minipress5Bid, Cozaar100, Lasix40; BP= 128/72 & she denies CP, palpit, dizzy, SOB, edema...    Chol has been well regulated on Simva20, RYR, CoQ10 w/ FLP 12/13 within parameters...    GI is stable on Prev30Bid & probiotic daily, followed by DrStark...    GU remains stable on Elmiron &  DDAVP; followed by DrPeterson & Fontaine... We reviewed prob list, meds, xrays and labs> see below for updates >>   ~  December 06, 2013:  6-771moOV & [name] a number of questions today> notes freq fever blisters & wants rx (Acyclovir prn); wants Prevnar-13 (OK); wants CXR & BMD (ordered); has mole on her back & we will refer to Derm for excision; wonders about a CRP test (we discussed this); wants to change her PPI to Protonix (OK)... We reviewed the following medical problems during today's office visit >>     AR, Asthma, Bronchitis> on NEBS w/ Albut, Symbicort80, Allegra180; she has last Asthma exac 12/14- treated by TP w/ Depo/ Pred/ ZPak/ & incr Symbicort etc; she improved & ret to baseline but didn't bring meds to this OV...    HBP, HxCP> on ASA81, Amlod10, Losar100, Minipress5, Lasix40;  BP=138/72 & she denies CP, palpit, SOB, edema; [facility], CP, SOB; doing satis & no changes made...    Cerebrovasc dis> on ASA81; she had f/u CDopplers 4/14 showing stable mild heterogeneous plaque in bulbs & 0-39% ICA stenoses (incidental 1cm right thyroid cyst).    VenInsuffic> on low sodium, elevation, support hose...    Chol> on Simva20; FLP 2/15 shows TChol 164, TG 43, HDL 62, LDL 94    GI- HH, GERD, Divertics> on Prev30bid & probiotic daily; she denies abd pain, dysphagia, n/v, c/d, blood seen; [place] DrStark w/ divertics only, but there is a FamHx colon ca in uncle, therefore f/u 5y19yr.    GU- IC, KidStones> on DDAVP & Elmiron100Tid; she is s/p DMSO treatments; states voiding is fine, denies recent leakage, incont, etc; last stone was 2007...    DJD, Osteopenia> on Norco5, Alendronate70/wk, calcium, MVI, VitD; she is s/p leftTHR by DrGioffre2004; last BMD 1/12 [facility]    HAs> she is s/p eval in [facility] clinics 1994 & 2003...    HxMelanoma> [facility] medial left [facility] We reviewed prob list, meds, xrays and labs> see  below for updates >>   CXR 2/15 showed norm heart size, chronic changes w/ incr markings and blunt right angle, NAD  PFTs 2/15 showed FVC=2.38 (91%), FEV1=1.56 (79%), %1sec=65%, mid-flows=38% predicted... c/w GOLD Stage1 COPD  BMD 2/15 showed TScores +0.3 in Spine and -1.8 in femNeck (improved on the Alendronate)...  LABS 2/15:  FLP at goals on Simva20;  Chems- wnl;  CBC- wnl;  TSH= 2.55;  VitD= 48   ~  June 27, 2015:  73m28mo & FranDaphnels me that she's had a good yr, feeling well, no new complaints or concerns; she proudly reports that her grandson graduated from  [employer] & is now in helicopter school;  We reviewed the following medical problems during today's office visit >>     AR, Asthma, Bronchitis> on NEBS w/ Albut (ave <1/mo), Symbicort160Bid, OTC antihist prn; she denies recent asthma exac, hasn't required antibiotics, pred, etc; stable DOE, denies cough/ sput/ hemoptysis/ CP/ etc; states juice plus helps    HBP, HxCP> on ASA81, Amlod10, Losar100, Minipress5, Lasix40;  BP=130/74 & she denies CP, palpit, SOB, edema; followed by DrKatz (last 5/13) for HBP, CP, SOB; doing satis & no changes made...    Cerebrovasc dis> on ASA81; she had f/u CDopplers 7/16 showing stable mild heterogeneous plaque in bulbs & 1-39% Bilat ICA stenoses (incidental 1cm right thyroid cyst).    VenInsuffic> on low sodium, elevation, support hose...    Chol> on Simva20; [place] 12/15 shows TChol 185, TG 37, HDL 71, LDL 107    GI- HH, GERD, Divertics> on Prilosec40 & probiotic daily; she denies abd pain, dysphagia, n/v, c/d, blood seen; known [place], f/u colon 4/16 by DrStark w/ divertics & Gr1 hems...     GU- IC, KidStones> on DDAVP & Elmiron100Tid; she is s/p DMSO treatments; states voiding is fine, denies recent leakage, incont, etc; last stone was 2007...    DJD, Osteopenia> on Norco5, Alendronate70/wk, calcium, MVI, VitD; she is s/p leftTHR by DrGioffre2004; last BMD 1/12 showed TScore -2.3 in [facility]...    HAs>  she is s/p eval in HA clinics 1994 & 2003...    HxMelanoma> removed from medial left knee in 2006 by DrHall... EXAM shows Afeb, VSS, O2sat=97% on RA;  HEENT- neg;  Chest- clear w/o w/r/r;  Heart- RR gr1-2/6 SEM w/o r/g;  Abd- soft, neg;  Ext- w/o c/c/e;  Neuro- intact... We reviewed prob list, meds, xrays and labs> see below for updates >> Her new PCP is DrHKim...  CDoppler 7/16 showed bilat heterogeneous plaque, stable 1-39% bilat ICAstenoses, norm subclav & patent vertebrals w/ antegrade flow (f/u 32yr)...  2DEcho 8/16 showed norm LVF w/ EF=60-65%, no regional wall motion abn, AV mildly thickened & calcif (sclerosis w/o stenosis), mitral annular calcif w/o MR/MS, LA dil at 582m no ASD or PFO, norm RV & PA... ?why the LA dil?  ~  July 03, 2016:  Yearly ROV & pulmonary recheck>  She remains on SYMBICORT160-2spBid regularly, Albuterol via NEB or HFA as needed (she reports zero use over the last 6+months), and allergy shots per DrVanWinkle;  She reports that breathing is good- denies cough, sput, hemoptysis, SOB, CP, etc; she has not had an interval URI, bronchitic infection or worse;  We gave her the 2017 flu shot today & she is otherw up-to-date on immunizations; she has been under some stress w/ her daughters health issues and personal problems...    FrJoaquim LaiPCP is DrHKim, [place] office> seen 05/30/16 w/ hx of Asthma, allergies, HBP, carotid art dis, HL, GERD, IC & kidney stones, osteoporosis, hx melanoma- stable, no changes made to her meds...    She saw DrVanWinkle 04/19/16 for Allergy f/u visit> AR, asthma, acid reflux; stable on allergy shots x 30+yrs + Allegra, Flonase, Patanase; stable 7 continued on her same regimen...    She is followed by DrNelson for Cards- seen 10/20/15> HBP, ?MVP in past- no MVP on Echo but LAdil= 5325mabn EKG w/ LAD, carotid art dis, HL; she changed Simva20 to PRAV20 due to symptoms & she is overdue for f/u FLP on the new med=> she will contact DrKim for this; AND  they decreased  her Lasix40=>20/d...     She tells me that she has had incr LBP, saw DrGioffre & treated w/ Pred 04/2016=> improved...  EXAM shows Afeb, VSS, O2sat=96% on RA;  Wt=191#;  HEENT- neg, mallampati2;  Chest- clear w/o w/r/r;  Heart- RR gr1-2/6 SEM w/o r/g;  Abd- soft, neg;  Ext- w/o c/c/e;  Neuro- intact... We reviewed prob list, meds, xrays and labs>  IMP/PLAN>>  Dub Mikes is stable on her Symbicort160-2spBid + her rescue inhaler prn;  REC to continue same, call for any problems, & we plan routine ROV 65yr..          Problem List:  ALLERGIC RHINITIS (ICD-477.9) ASTHMA (ICD-493.90) - she takes ALLEGRA '180mg'$ /d, SYMBICORT 80- 1spBid, PROAIR & NEBS w/ Albuterol per DrESL; in addition [facility] & he treats her w/ freq courses of Pred for exacerbations... ~  CXR 6/09 was clear...  ~  CXR 2/10 hosp showed mild peribronch thickening, NAD... ~  PFT 3/10 showed FVC=2.36 (90%), FEV1=1.78 (85%), %1sec=75, mid-flows= 57%pred. ~  4/10: she saw DrKozlow for second opinion & Rx of her AB & GERD... ~  CXR 2/11 showed clear lungs, NAD... f/u 7/11= same (clear, NAD)..Marland Kitchen ~  RAST testing 5/11 showed IgE= 130, & neg rast tests x cockroach +low titer. ~  CXR 7/11 showed mild cardiomeg, clear lungs, NAD. ~  Sinus CT 8/11 showed no signif inflamm dis, mild membrane thickening, min ethmoid changes on right. ~  PFT 8/11 showed FVC=2.04 (71%), FEV1=1.58 (73%), %1sec=78, mid-flows= 70%pred. ~  11/12:  She reports no recent infectious exac & doing better on a new supplement "nopalea" which she says reduces inflamm. ~  12/13: on NEBS w/ Albut, Symbicort80, Allegra180; she has last Asthma exac 8/13- treated by DrSharma w/ Depo/ Pred/ ZPak/ & incr Symbicort to 160; she improved & ret to baseline but didn't bring meds to this OV... ~  7/14: Asthma is stable on Symbicort80- taking one puff Bid & ProventilHFA vs NEB prn  BRONCHITIS, RECURRENT (ICD-491.9) - hx freq  infectious exac... ~  CTChest 2008 w/ 3969mnodule in RUL- unchanged over 69m27moterval, small HH, noncalcif pleural plaques bilat... she is reassured w/ the recent CXR & serial films not showing nodule. ~  no recent resp infections but she likes to keep ZPak handy just in case.  HYPERTENSION (ICD-401.9) &  CHEST PAIN-UNSPECIFIED (ICD-786.50) - controlled on 4 meds:  NORVASC '10mg'$ /d, LOSARTAN '100mg'$ /d, MINIPRESS '5mg'$ Bid, & LASIX '40mg'$ /d... ~  2DEcho 8/09 showed norm LV wall motion & EF= 60-65%, mild incr AoV thickness, mild LA dil & atrial septal aneurysm noted. ~  EKG 9/10 by DrKatz- NSR, WNL... ~  2DEcho 9/10 showed norm LV wall motion & thickness, EF= 60-65%, gr I DD noted, sl dil LA, right side OK...  ~  9/11:  BP= 130/84, and BP's at home are even better- 120's/ 60's per pt... denies HA, visual changes, CP, palipit, dizziness, syncope, edema, etc... ~  1/12:  She had f/u appt DrKatz- stable, no changes made... ~  5/12:  BP= 122/74 and stable on meds... ~  11/12:  BP= 126/74 & similar at home she says... ~  5/13:  She saw DrKatz for Cards f/u> EKG showed NSR, rate76, poor R progression, NAD; stable & doing well- no changes made... ~  6/13:  BP= 138/78 & she is feeling well; denies CP, palpit, SOB, edema, etc... ~  12/13:  on ASA81, Amlod10, Losar100, Minipress5, Lasix40;  BP=144/80 & she  denies CP, palpit, SOB, edema; [facility]- seen 5/13 for f/u HBP, CP, SOB; doing satis & no changes made... ~  7/14: BP looks good on Amlod10, Minipress5Bid, Cozaar100, Lasix40; BP= 128/72 & she denies CP, palpit, dizzy, SOB, edema.  CEREBROVASCULAR DISEASE (ICD-437.9) - on ASA '81mg'$ /d... she had an abn lifeline screen 3/08 w/ left Carotid in the mild/mod range... ~  CDopplers 4/09 showed mild smooth heterogen plaque in bulbs, 0-39% ICA stenoses... ~  f/u CDoppler 1/12 by Benay Spice showed mild heterogen plaque in bulbs, 0-39% bilat ICAstenoses... 1cm thyr cyst noted- not palpated.  VENOUS INSUFFICIENCY  (ICD-459.81) - she knows to avoid sodium, elevate legs, wear support hose, continue the Lasix...  HYPERCHOLESTEROLEMIA (ICD-272.0) - on SIMVASTATIN '20mg'$ /d + CoQ10 daily... prev intol to statins w/ severe leg cramps but the addition of the CoQ10 has made a world of difference to her tolerability... ~  [place] 3/08 on diet alone showed TChol 268, TG 35, HDL 96, LDL 166... ~  [place] 9/08 on Crestor '5mg'$ /d showed TChol 212, TG 37, HDL 73, LDL 127... continue Rx. ~  [place] 12/09 showed TChol 179, TG 38, HDL 78, LDL 93... continue same Rx. ~  FLP 2/11 on Cres5 showed TChol 161, TG 42, HDL 70, LDL 83... pt switched to Simva20 per request. ~  [place] 9/11 on Simva20 showed TChol 209, TG 42, HDL 72, LDL 127... Same med, better diet, get wt down. ~  [place] 11/12 on Simva20 showed TChol 168, TG 35, HDL 67, LDL 94 ~  FLP 12/13 on simva20 showed TChol 152, TG 34, HDL 64, LDL 81   GERD (ICD-530.81) - on PREVACID '30mg'$ Bid + max antireflux regimen Qhs. ~  last EGD 6/08 by DrSam showed [place], gastitis, and stricture- dilated... prev HPylori neg in 2007.  DIVERTICULOSIS OF COLON (ICD-562.10) ~  colonoscopy 3/05 by DrSam showed divertics only... f/u planned 80yr (+fam hx in uncle). ~  colonoscopy 9/10 by DrStark showed divertics only... 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 she has had DMSO in bladder + taking> ELMIRON '100mg'$ Tid, & DDAVP 0.'2mg'$ -3tabsHS (off prev Vesicare)... last DMSO treatment ~5/11 per pt. ~  Note:  She is followed by DrFontaine for GYN...  RENAL CALCULUS, HX OF (ICD-V13.01) - required cysto & basket stone removal from right ureter in 2007...  DEGENERATIVE JOINT DISEASE (ICD-715.90) - she is s/p left THR by DrGioffre in 2004...  OSTEOPOROSIS (ICD-733.00) - on ALENDRONATE '70mg'$ /wk started 1/12; + Calcium, MVI, VitD; prev on Evista & this was stopped 6/13... ~  labs 12/09 showed Vit D level = 34... advised to start Vit D OTC 1-2,000 u daily... ~  BMD 1/12 showed  TScores -0.1 in Spine, and -2.3 [facility] FOSAMAX '70mg'$ /wk along w/ her Calcium, MVI, VitD 5000u (esp in light of her freq Pred use). ~  6/13:  Pt wanted to know if it was ok to stop her Evista since it is so expensive; she saw DrFontaine & he deferred to us[place] to stop Evista. ~  BMD 2/15 showed TScores +0.3 in Spine and -1.8 in [facility] and rec to continue the Alendronate, calcium, MVI & VitD the same for now...  HEADACHE (ICD-784.0) - prev eval at the Headache clinic- 1994 by DrSpillman, and 2003 by DrFreeman...  Hx of MALIGNANT MELANOMA (ICD-172.9) - [facility]...  Health Maintenance - she gets the seasonal Flu vaccine yearly... given f/u PNEUMOVAX at  age 45 in 2009...   Past Surgical History:  Procedure Laterality Date  . BREAST SURGERY  2013   Breast Bx-Benign  . CARDIAC CATHETERIZATION    . CATARACT EXTRACTION, BILATERAL    . cystoscopy and basket stone removal right ureter  02/2006   Dr. Terance Hart  . GYNECOLOGIC CRYOSURGERY  1971  . left total hip replacement  2004   Dr. Gladstone Lighter  . melanoma removed from medial rleft knee area  2006   Dr. [place] Crane  . NASAL SEPTUM SURGERY      Outpatient Encounter Prescriptions as of 07/03/2016  Medication Sig Dispense Refill  . acyclovir (ZOVIRAX) 400 MG tablet '400mg'$  tid x5 days for outbreak 15 tablet 3  . albuterol (PROAIR HFA) 108 (90 BASE) MCG/ACT inhaler Inhale 2 puffs into the lungs every 6 (six) hours as needed for wheezing. 3 Inhaler 0  . albuterol (PROVENTIL) (2.5 MG/3ML) 0.083% nebulizer solution Take 3 mLs (2.5 mg total) by nebulization every 6 (six) hours as needed. 360 mL 0  . amLODipine (NORVASC) 10 MG tablet TAKE 1 TABLET BY MOUTH EVERY DAY 90 tablet 0  . Ascorbic Acid (VITAMIN C) 500 MG tablet Take 500 mg by mouth daily.      Marland Kitchen aspirin 81 MG tablet Take 81 mg by mouth daily.      . budesonide-formoterol (SYMBICORT) 160-4.5 MCG/ACT inhaler Inhale 2 puffs into  the lungs 2 (two) times daily. 3 Inhaler 3  . Calcium Carbonate (CALCIUM 600) 1500 MG TABS Take 1 tablet by mouth daily.      . Cholecalciferol (VITAMIN D3) 5000 UNITS CAPS Take 1 capsule by mouth daily.      . Cinnamon 500 MG TABS Take by mouth. With Chromium    . clobetasol cream (TEMOVATE) 4.09 % Apply 1 application topically at bedtime as needed. 30 g 1  . Coenzyme Q10 (COQ10) 100 MG CAPS Take 1 capsule by mouth daily.      Marland Kitchen desmopressin (DDAVP) 0.2 MG tablet Take 3 tablets by mouth daily at bedtime     . furosemide (LASIX) 20 MG tablet Take 1 tablet (20 mg total) by mouth daily. 90 tablet 3  . hydrOXYzine (ATARAX/VISTARIL) 10 MG tablet Take 10 mg by mouth daily.    Marland Kitchen losartan (COZAAR) 100 MG tablet TAKE 1 TABLET (100 MG TOTAL) BY MOUTH DAILY. 90 tablet 1  . magnesium oxide (MAG-OX) 400 MG tablet Take 400 mg by mouth daily.      . Multiple Vitamin (MULTIVITAMIN) capsule Take 1 capsule by mouth daily.      Marland Kitchen omeprazole (PRILOSEC) 40 MG capsule TAKE 1 CAPSULE (40 MG TOTAL) BY MOUTH DAILY. 30 capsule 3  . OVER THE COUNTER MEDICATION Bone growth factor    . OVER THE COUNTER MEDICATION 3 oz daily. Nopolea    . OVER THE COUNTER MEDICATION Ocuvite 50 +    . OVER THE COUNTER MEDICATION Juice plus    . pentosan polysulfate (ELMIRON) 100 MG capsule Take 100 mg by mouth 3 (three) times daily before meals.      . pravastatin (PRAVACHOL) 20 MG tablet Take 1 tablet (20 mg total) by mouth every evening. 90 tablet 3  . prazosin (MINIPRESS) 5 MG capsule TAKE 1 CAPSULE (5 MG TOTAL) BY MOUTH 2 (TWO) TIMES DAILY. 180 capsule 1  . PRESCRIPTION MEDICATION Allergy shots every week    . Probiotic Product (PROBIOTIC PO) 1 daily     No facility-administered encounter medications on file as of 07/03/2016.  Allergies  Allergen Reactions  . Cephalexin     REACTION: high fever  . Penicillins     REACTION: severe swelling and hives  . Phenobarbital     REACTION: hives  . Tape     rash    Review of  Systems         See HPI - all other systems neg except as noted... The patient complains of dyspnea on exertion.  The patient denies anorexia, fever, weight loss, weight gain, vision loss, decreased hearing, hoarseness, chest pain, syncope, peripheral edema, prolonged cough, headaches, hemoptysis, abdominal pain, melena, hematochezia, severe indigestion/heartburn, hematuria, incontinence, muscle weakness, suspicious skin lesions, transient blindness, difficulty walking, depression, unusual weight change, abnormal bleeding, enlarged lymph nodes, and angioedema.     Objective:   Physical Exam      WD, Overweight, 75 y/o WF in NAD... Vital Signs:  Reviewed... GENERAL:  Alert & oriented; pleasant & cooperative... HEENT:  [place]/AT, EOM-wnl, PERRLA, EACs-clear, TMs- some wax, NOSE-clear, THROAT- resolved, clear now. NECK:  Supple w/ fairROM; no JVD; normal carotid impulses w/o bruits; no thyromegaly or nodules palpated; no lymphadenopathy. CHEST:  essentially clear- without wheezing, rales, or signs of consolidation. HEART:  Regular Rhythm; Gr1-2/6 SEM w/o rubs or gallops detected... ABDOMEN:  Obese, soft & nontender; normal bowel sounds; no organomegaly or masses palpated... EXT: without deformities, mild arthritic changes; no varicose veins/ +venous insuffic/ tr edema. NEURO:  CN's intact;  no focal neuro deficits... DERM:  No lesions noted; no rash etc...  RADIOLOGY DATA:  Reviewed in the EPIC EMR & discussed w/ the patient...  LABORATORY DATA:  Reviewed in the EPIC EMR & discussed w/ the patient...   Assessment & Plan:    ASTHMA>  On regimen as above, she indicates doing well & hasn't needed Pred in yrs due to her supplements- "noplea" "juice plus" etc...  HBP>  Controlled on meds, continue same, get wt down...  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  CHOL> on Simva20, has added RYR she says, & FLP looks  good...  GI>  Hx GERD stable on PPI Rx.  IC>  Followed by Urology & stable by her report...  DJD/ OSteopenia>  Now on Fosamax along w/ her Calcium, MVI, Vit D...  Other medical problems as noted...   Patient's Medications  New Prescriptions   No medications on file  Previous Medications   ACYCLOVIR (ZOVIRAX) 400 MG TABLET    '400mg'$  tid x5 days for outbreak   ALBUTEROL (PROAIR HFA) 108 (90 BASE) MCG/ACT INHALER    Inhale 2 puffs into the lungs every 6 (six) hours as needed for wheezing.   ALBUTEROL (PROVENTIL) (2.5 MG/3ML) 0.083% NEBULIZER SOLUTION    Take 3 mLs (2.5 mg total) by nebulization every 6 (six) hours as needed.   AMLODIPINE (NORVASC) 10 MG TABLET    TAKE 1 TABLET BY MOUTH EVERY DAY   ASCORBIC ACID (VITAMIN C) 500 MG TABLET    Take 500 mg by mouth daily.     ASPIRIN 81 MG TABLET    Take 81 mg by mouth daily.     BUDESONIDE-FORMOTEROL (SYMBICORT) 160-4.5 MCG/ACT INHALER    Inhale 2 puffs into the lungs 2 (two) times daily.   CALCIUM CARBONATE (CALCIUM 600) 1500 MG TABS    Take 1 tablet by mouth daily.     CHOLECALCIFEROL (VITAMIN D3) 5000 UNITS CAPS    Take 1 capsule by mouth daily.  CINNAMON 500 MG TABS    Take by mouth. With Chromium   CLOBETASOL CREAM (TEMOVATE) 0.05 %    Apply 1 application topically at bedtime as needed.   COENZYME Q10 (COQ10) 100 MG CAPS    Take 1 capsule by mouth daily.     DESMOPRESSIN (DDAVP) 0.2 MG TABLET    Take 3 tablets by mouth daily at bedtime    FUROSEMIDE (LASIX) 20 MG TABLET    Take 1 tablet (20 mg total) by mouth daily.   HYDROXYZINE (ATARAX/VISTARIL) 10 MG TABLET    Take 10 mg by mouth daily.   LOSARTAN (COZAAR) 100 MG TABLET    TAKE 1 TABLET (100 MG TOTAL) BY MOUTH DAILY.   MAGNESIUM OXIDE (MAG-OX) 400 MG TABLET    Take 400 mg by mouth daily.     MULTIPLE VITAMIN (MULTIVITAMIN) CAPSULE    Take 1 capsule by mouth daily.     OMEPRAZOLE (PRILOSEC) 40 MG CAPSULE    TAKE 1 CAPSULE (40 MG TOTAL) BY MOUTH DAILY.   OVER THE COUNTER MEDICATION     Bone growth factor   OVER THE COUNTER MEDICATION    3 oz daily. Nopolea   OVER THE COUNTER MEDICATION    Ocuvite 50 +   OVER THE COUNTER MEDICATION    Juice plus   PENTOSAN POLYSULFATE (ELMIRON) 100 MG CAPSULE    Take 100 mg by mouth 3 (three) times daily before meals.     PRAVASTATIN (PRAVACHOL) 20 MG TABLET    Take 1 tablet (20 mg total) by mouth every evening.   PRAZOSIN (MINIPRESS) 5 MG CAPSULE    TAKE 1 CAPSULE (5 MG TOTAL) BY MOUTH 2 (TWO) TIMES DAILY.   PRESCRIPTION MEDICATION    Allergy shots every week   PROBIOTIC PRODUCT (PROBIOTIC PO)    1 daily  Modified Medications   No medications on file  Discontinued Medications   No medications on file

## 2016-07-04 NOTE — Progress Notes (Addendum)
Subjective:   Brianna Mccarty is a 75 y.o. female who presents for Medicare Annual (Subsequent) preventive examination.  HRA assessment completed during this visit with Brianna Mccarty   The Patient was informed that the wellness visit is to identify future health risk and educate and initiate measures that can reduce risk for increased disease through the lifespan.    NO ROS; Medicare Wellness Visit Last OV:  05/2016 Labs completed: 10/2015   Lifestyle review and risk: (mother had DM; HD; father had cancer; sister HTN; brother had cancer; DM and hTN; uncle had colon cancer  PMH: HTN/ dtr is going through some stress but this is resolving Has been up to [id number] systolic  x 3 to 4 times / to monitor her BP  CAD;  Osteoporosis hx stated last Dexa was improved since 2015;  Melanoma; on  Back, now she is on annual checks  Interstitial cystitis takes DDAVP;  elmiron / both help Taking nopolea to help allergies    Psychosocial: Spouse; married x 11 years; just retired x 75 yo  Has dtr / and son Dtr with recent issues;   Tobacco: never smoked   How many drinks do you have per week? Social   Medications: no issues   BMI: 34   Diet;  Heart healthy? Thinks otc supplements are helping her chol  HDL 119; Triglycerides 43;    Exercise;  1.5 to 2 miles 4 days a week; Minutes; 50 minutes  Does house work;  On the go all the time     [place];  Fall hx; yes; last October fell and broke her right arm /  Working in Programmer, multimedia bed;  Fear of falling? no  Given education on "Fall Prevention in the Home" for more safety tips the patient can apply as appropriate.  Long term goal is to "age in place" or undecided   One level; aged in place  Safety features reviewed for safe community; has neighborhood watch; alarm system; firearms if in the home; smoke alarms; sun protection when outside; she does;  driving difficulties or accidents; no issues   Mental Health:  Any emotional problems?  Anxious, depressed, irritable, sad or blue? No  Denies feeling depressed or hopeless; voices pleasure in daily life How many social activities have you been engaged in within the last 2 weeks? no Who would help you with chores; illness; shopping other?  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 No   Sleep pattern changes; sleeps well  Hearing: ears have been checked Has to have wax cleaned out q 6 months; 2000 hz both ears  Ophthalmology exam/ Next Wed Regular eye exam Dr. Herbert Deaner;    Advanced Directive addressed; Completed or educated   Counseling Health Maintenance Gaps:  Had flu shot 07/03/2016  Colonoscopy; 02/2015; now aged out  EKG: 10/2015 Mammogram: 12/2015  Dexa/ 12/2013 Osteopenia with lowest T  score -1.8 at the femoral neck / hx alendronate; 10/2014;  Bone growth factor with multi vit she feels is helping  Recommendations for Dexa Scan repeat in the future and can discuss with Dr. Maudie Mercury. Or Dr. Nevada Crane if managing   Calcium 1200mg  with Vit D 800u per day; more as directed by physician Strength building exercises discussed; can include walking; housework; small weights or stretch bands; silver sneakers if access to the Y  Vision checks'; Every year now/ Dr. Zigmund Daniel  Does have some macular degeneration Both eyes have had cataracts    Immunizations Due: (Vaccines reviewed and  educated regarding any overdue)   Health Recommendations and Referrals Check BP  Continue stress reducing exercises and DB Very healthy; well informed  Expressed concern over pre diabetes due to the hx in her family. Education given on pre-diabetes and screening numbers. Last lab 12/2015 CMP was 127 but does not know if she was NPO  Prevention includes 30 minutes of brisk walking qd or  Losing approx 7-8lbs  Which will take 1" off her waist   Barriers to Success None noted  Current Care Team reviewed and updated Dr. Lenna Gilford for asthma Dr. Junious Silk  Dr. Nevada Crane osteo  Cardiac Risk Factors include: advanced age (>61men, >16 women);dyslipidemia;family history of premature cardiovascular disease;hypertension;obesity (BMI >30kg/m2)     Objective:     Vitals: BP 138/84   Pulse 72   Ht 5\' 2"  (1.575 m)   Wt 190 lb 9 oz (86.4 kg)   SpO2 97%   BMI 34.85 kg/m   Body mass index is 34.85 kg/m.   Tobacco History  Smoking Status  . Never Smoker  Smokeless Tobacco  . Never Used     Counseling given: Yes   Past Medical History:  Diagnosis Date  . Abnormal EKG    Normal LV function in the past  . Allergic rhinitis   . Asthma   . Bronchitis, mucopurulent recurrent ([place])   . Carotid artery disease ([place])    Doppler, April, 2009, 0-39% bilateral  . Cervical dysplasia 1971  . Chest pain, unspecified   . Diverticulosis of colon   . DJD (degenerative joint disease)   . Ejection fraction    EF 60%, echo, 2009  . GERD (gastroesophageal reflux disease)   . Headache(784.0)   . Hypercholesterolemia   . Hypertension   . Interstitial cystitis    sees urologist  . Lichen sclerosus   . Malignant melanoma ([place])    sees Dr. Nevada Crane in dermatology  . Migraines   . Mitral valve disease    Question mitral valve prolapse in the past, no prolapse by echo 2009  . Murmur 10/20/2015  . Osteoporosis    on fosomax > 5 years, stopped 11/2015  . Renal calculus    sees urologist  . Thyroid cyst      1 x 1.1 thyroid cyst noted on carotid Doppler, January, 2012  . Venous insufficiency    Past Surgical History:  Procedure Laterality Date  . BREAST SURGERY  2013   Breast Bx-Benign  . CARDIAC CATHETERIZATION    . CATARACT EXTRACTION, BILATERAL    . cystoscopy and basket stone removal right ureter  02/2006   Dr. Terance Hart  . GYNECOLOGIC CRYOSURGERY  1971  . left total hip replacement  2004   Dr. Gladstone Lighter  . melanoma removed from medial rleft knee area  2006   Dr. Nevada Crane  . NASAL SEPTUM SURGERY     Family History  Problem Relation Age  of Onset  . Cancer Father     Pancreatic  . Diabetes Mother   . Heart disease Mother   . Cancer Brother     Bile duct  . Diabetes Brother   . Hypertension Brother   . Diabetes Brother   . Heart disease Brother   . Hypertension Brother   . Hypertension Sister   . Hypertension Sister   . Colon cancer Paternal Uncle    History  Sexual Activity  . Sexual activity: No    Comment: 1st intercourse 39 yo-1 partner    Outpatient Encounter Prescriptions as of 07/05/2016  Medication Sig  . acyclovir (ZOVIRAX) 400 MG tablet 400mg  tid x5 days for outbreak  . albuterol (PROAIR HFA) 108 (90 BASE) MCG/ACT inhaler Inhale 2 puffs into the lungs every 6 (six) hours as needed for wheezing.  Marland Kitchen albuterol (PROVENTIL) (2.5 MG/3ML) 0.083% nebulizer solution Take 3 mLs (2.5 mg total) by nebulization every 6 (six) hours as needed.  Marland Kitchen amLODipine (NORVASC) 10 MG tablet TAKE 1 TABLET BY MOUTH EVERY DAY  . Ascorbic Acid (VITAMIN C) 500 MG tablet Take 500 mg by mouth daily.    Marland Kitchen aspirin 81 MG tablet Take 81 mg by mouth daily.    . budesonide-formoterol (SYMBICORT) 160-4.5 MCG/ACT inhaler Inhale 2 puffs into the lungs 2 (two) times daily.  . Calcium Carbonate (CALCIUM 600) 1500 MG TABS Take 1 tablet by mouth daily.    . Cholecalciferol (VITAMIN D3) 5000 UNITS CAPS Take 1 capsule by mouth daily.    . Cinnamon 500 MG TABS Take by mouth. With Chromium  . clobetasol cream  (TEMOVATE) [id number] % Apply 1 application topically at bedtime as needed.  . Coenzyme Q10 (COQ10) 100 MG CAPS Take 1 capsule by mouth daily.    Marland Kitchen desmopressin (DDAVP) 0.2 MG tablet Take 3 tablets by mouth daily at bedtime   . furosemide (LASIX) 20 MG tablet Take 1 tablet (20 mg total) by mouth daily.  . hydrOXYzine (ATARAX/VISTARIL) 10 MG tablet Take 10 mg by mouth daily.  Marland Kitchen losartan (COZAAR) 100 MG tablet TAKE 1 TABLET (100 MG TOTAL) BY MOUTH DAILY.  . magnesium oxide (MAG-OX) 400 MG tablet Take 400 mg by mouth daily.    . Multiple Vitamin (MULTIVITAMIN) capsule Take 1 capsule by mouth daily.    Marland Kitchen omeprazole (PRILOSEC) 40 MG capsule TAKE 1 CAPSULE (40 MG TOTAL) BY MOUTH DAILY.  Marland Kitchen OVER THE COUNTER MEDICATION Bone growth factor  . OVER THE COUNTER MEDICATION 3 oz daily. Nopolea  . OVER THE COUNTER MEDICATION Ocuvite 50 +  . OVER THE COUNTER MEDICATION Juice plus  . pentosan polysulfate (ELMIRON) 100 MG capsule Take 100 mg by mouth 3 (three) times daily before meals.    . pravastatin (PRAVACHOL) 20 MG tablet Take 1 tablet (20 mg total) by mouth every evening.  . prazosin (MINIPRESS) 5 MG capsule TAKE 1 CAPSULE (5 MG TOTAL) BY MOUTH 2 (TWO) TIMES DAILY.  Marland Kitchen PRESCRIPTION MEDICATION Allergy shots every week  . Probiotic Product (PROBIOTIC PO) 1 daily   No facility-administered encounter medications on file as of 07/05/2016.     Activities of Daily Living In your present state of health, do you have any difficulty performing the following activities: 07/05/2016  Hearing? N  Vision? N  Difficulty concentrating or making decisions? N  Walking or climbing stairs? N  Dressing or bathing? N  Doing errands, shopping? N  Preparing Food and eating ? N  Using the Toilet? N  In the  past six months, have you accidently leaked urine? Y  Do you have problems with loss of bowel control? N  Managing your Medications? N  Managing your Finances? N  Housekeeping or managing your Housekeeping? N  Some recent data  might be hidden    Patient Care Team: Lucretia Kern, DO as PCP - General (Family Medicine)    Assessment:    Education provided and lifestyle risk discussed   All Health Maintenance Gaps Reviewed for closure     Exercise Activities and Dietary recommendations Current Exercise Habits: Home exercise routine, Time (Minutes): 60, Frequency (Times/Week): 5, Weekly Exercise (Minutes/Week): 300, Intensity: Moderate  Goals    . Exercise 150 minutes per week (moderate activity)          Do more walking;  Will develop piliates routine   Stretching and deep breathing        Fall Risk Fall Risk  07/05/2016 07/03/2016 11/27/2015 10/25/2014 05/18/2013  Falls in the past year? Yes No Yes No No  Number falls in past yr: 1 - 1 - -  Injury with Fall? - - Yes - -  Follow up Education provided - - - -   Depression Screen PHQ 2/9 Scores 07/03/2016 11/27/2015 08/06/2015 10/25/2014  PHQ - 2 Score 0 0 0 0     Cognitive Testing MMSE - Mini Mental State Exam 07/05/2016  Not completed: (No Data)   Ad8 score 0    Immunization History  Administered Date(s) Administered  . Influenza Split 09/18/2011, 08/11/2012, 07/28/2014  . Influenza Whole 07/21/2008, 08/04/2009, 08/01/2010  . Influenza, High Dose Seasonal PF 07/19/2013, 07/28/2015  . Influenza,inj,Quad PF,36+ Mos 07/03/2016  . Pneumococcal Conjugate-13 12/06/2013  . Pneumococcal Polysaccharide-23 10/03/2008  . Tdap 11/04/2008, 10/24/2015  . Zoster 11/05/2003   Screening Tests Health Maintenance  Topic Date Due  . COLONOSCOPY  02/07/2020  . TETANUS/TDAP  10/23/2025  . INFLUENZA VACCINE  Completed  . DEXA SCAN  Completed  . ZOSTAVAX  Completed  . PNA vac Low Risk Adult  Completed      Plan:   Will discuss repeat dexa scan with Dr. Maudie Mercury. ( or Dr. Nevada Crane if other following)  2015; can repeat 25 months;  Encouraged to go to the  [employer] website for information   Patient expressed concern about Diabetes since this runs in  her family  Will give you some information on Pre diabetes and screening levels Educated regarding prediabetes and numbers;  A1c ranges from 5.8 to 6.5 or fasting Blood sugar > 115 -126; (126 is diabetic)   During the course of the visit the patient was educated and counseled about the following appropriate screening and preventive services:   Vaccines to include Pneumoccal, Influenza, Hepatitis B, Td, Zostavax, HCV  Electrocardiogram  Cardiovascular Disease  Colorectal cancer screening  Bone density screening  Diabetes screening  Glaucoma screening  Mammography/PAP  Nutrition counseling   Patient Instructions (the written plan) was given to the patient.   Wynetta Fines, RN  07/05/2016    Lucretia Kern., DO

## 2016-07-05 ENCOUNTER — Ambulatory Visit (INDEPENDENT_AMBULATORY_CARE_PROVIDER_SITE_OTHER): Payer: PPO

## 2016-07-05 VITALS — BP 138/84 | HR 72 | Ht 62.0 in | Wt 190.6 lb

## 2016-07-05 DIAGNOSIS — Z0000 Encounter for general adult medical examination without abnormal findings: Secondary | ICD-10-CM | POA: Diagnosis not present

## 2016-07-05 NOTE — Patient Instructions (Addendum)
Brianna Mccarty , Thank you for taking time to come for your Medicare Wellness Visit. I appreciate your ongoing commitment to your health goals. Please review the following plan we discussed and let me know if I can assist you in the future.   Will discuss repeat dexa scan with Dr. Maudie Mercury. 2015; can repeat 25 months;  Osteoporosis [employer];   Will give you some information on Pre diabetes and screening levels Educated regarding prediabetes and numbers;  A1c ranges from 5.8 to 6.5 or fasting Blood sugar > 115 -126; (126 is diabetic)   Risk: >45yo; family hx; overweight or obese; African American; Hispanic; Latino; American [place] American; [place]; history of diabetes when pregnant; or birth to a baby weighing over 9 lbs. Being less physically active than 30 minutes; 3 times a week;   Prevention; Losing a modest 7 to 8 lbs; If over 200 lbs; 10 to 14 lbs;  Choose healthier foods; colorful veggies; fish or lean meats; drinks water Reduce portion size Start exercising; 30 minutes of fast walking x 30 minutes per day/ 60 min for weight loss     These are the goals we discussed: Goals    . Exercise 150 minutes per week (moderate activity)          Do more walking;  Will develop piliates routine   Stretching and deep breathing         This is a list of the screening recommended for you and due dates:  Health Maintenance  Topic Date Due  . Colon Cancer Screening  02/07/2020  . Tetanus Vaccine  10/23/2025  . Flu Shot  Completed  . DEXA scan (bone density measurement)  Completed  . Shingles Vaccine  Completed  . Pneumonia vaccines  Completed       Fall Prevention in the Home  Falls can cause injuries. They can happen to people of all ages. There are many things you can do to make your home safe and to help prevent falls.  WHAT CAN I DO ON THE OUTSIDE OF MY HOME?  Regularly fix the edges of walkways and driveways and fix any cracks.  Remove anything that  might make you trip as you walk through a door, such as a raised step or threshold.  Trim any bushes or trees on the path to your home.  Use bright outdoor lighting.  Clear any walking paths of anything that might make someone trip, such as rocks or tools.  Regularly check to see if handrails are loose or broken. Make sure that both sides of any steps have handrails.  Any raised decks and porches should have guardrails on the edges.  Have any leaves, snow, or ice cleared regularly.  Use sand or salt on walking paths during winter.  Clean up any spills in your garage right away. This includes oil or grease spills. WHAT CAN I DO IN THE BATHROOM?   Use night lights.  Install grab bars by the toilet and in the tub and shower. Do not use towel bars as grab bars.  Use non-skid mats or decals in the tub or shower.  If you need to sit down in the shower, use a plastic, non-slip stool.  Keep the floor dry. Clean up any water that spills on the floor as soon as it happens.  Remove soap buildup in the tub or shower regularly.  Attach bath mats securely with double-sided non-slip rug tape.  Do not have throw rugs and other things  on the floor that can make you trip. WHAT CAN I DO IN THE BEDROOM?  Use night lights.  Make sure that you have a light by your bed that is easy to reach.  Do not use any sheets or blankets that are too big for your bed. They should not hang down onto the floor.  Have a firm chair that has side arms. You can use this for support while you get dressed.  Do not have throw rugs and other things on the floor that can make you trip. WHAT CAN I DO IN THE KITCHEN?  Clean up any spills right away.  Avoid walking on wet floors.  Keep items that you use a lot in easy-to-reach places.  If you need to reach something above you, use a strong step stool that has a grab bar.  Keep electrical cords out of the way.  Do not use floor polish or wax that makes floors  slippery. If you must use wax, use non-skid floor wax.  Do not have throw rugs and other things on the floor that can make you trip. WHAT CAN I DO WITH MY STAIRS?  Do not leave any items on the stairs.  Make sure that there are handrails on both sides of the stairs and use them. Fix handrails that are broken or loose. Make sure that handrails are as long as the stairways.  Check any carpeting to make sure that it is firmly attached to the stairs. Fix any carpet that is loose or worn.  Avoid having throw rugs at the top or bottom of the stairs. If you do have throw rugs, attach them to the floor with carpet tape.  Make sure that you have a light switch at the top of the stairs and the bottom of the stairs. If you do not have them, ask someone to add them for you. WHAT ELSE CAN I DO TO HELP PREVENT FALLS?  Wear shoes that:  Do not have high heels.  Have rubber bottoms.  Are comfortable and fit you well.  Are closed at the toe. Do not wear sandals.  If you use a stepladder:  Make sure that it is fully opened. Do not climb a closed stepladder.  Make sure that both sides of the stepladder are locked into place.  Ask someone to hold it for you, if possible.  Clearly mark and make sure that you can see:  Any grab bars or handrails.  First and last steps.  Where the edge of each step is.  Use tools that help you move around (mobility aids) if they are needed. These include:  Canes.  Walkers.  Scooters.  Crutches.  Turn on the lights when you go into a dark area. Replace any light bulbs as soon as they burn out.  Set up your furniture so you have a clear path. Avoid moving your furniture around.  If any of your floors are uneven, fix them.  If there are any pets around you, be aware of where they are.  Review your medicines with your doctor. Some medicines can make you feel dizzy. This can increase your chance of falling. Ask your doctor what other things that you  can do to help prevent falls.   This information is not intended to replace advice given to you by your health care provider. Make sure you discuss any questions you have with your health care provider.   Document Released: 08/17/2009 Document Revised: 03/07/2015 Document Reviewed: 11/25/2014  Chartered certified accountant Patient Education 2016 [place] Maintenance, Female Adopting a healthy lifestyle and getting preventive care can go a long way to promote health and wellness. Talk with your health care provider about what schedule of regular examinations is right for you. This is a good chance for you to check in with your provider about disease prevention and staying healthy. In between checkups, there are plenty of things you can do on your own. Experts have done a lot of research about which lifestyle changes and preventive measures are most likely to keep you healthy. Ask your health care provider for more information. WEIGHT AND DIET  Eat a healthy diet  Be sure to include plenty of vegetables, fruits, low-fat dairy products, and lean protein.  Do not eat a lot of foods high in solid fats, added sugars, or salt.  Get regular exercise. This is one of the most important things you can do for your health.  Most adults should exercise for at least 150 minutes each week. The exercise should increase your heart rate and make you sweat (moderate-intensity exercise).  Most adults should also do strengthening exercises at least twice a week. This is in addition to the moderate-intensity exercise.  Maintain a healthy weight  Body mass index (BMI) is a measurement that can be used to identify possible weight problems. It estimates body fat based on height and weight. Your health care provider can help determine your BMI and help you achieve or maintain a healthy weight.  For females 98 years of age and older:   A BMI below 18.5 is considered underweight.  A BMI of 18.5 to 24.9 is  normal.  A BMI of 25 to 29.9 is considered overweight.  A BMI of 30 and above is considered obese.  Watch levels of cholesterol and blood lipids  You should start having your blood tested for lipids and cholesterol at 76 years of age, then have this test every 5 years.  You may need to have your cholesterol levels checked more often if:  Your lipid or cholesterol levels are high.  You are older than 76 years of age.  You are at high risk for heart disease.  CANCER SCREENING   Lung Cancer  Lung cancer screening is recommended for adults 73-56 years old who are at high risk for lung cancer because of a history of smoking.  A yearly low-dose CT scan of the lungs is recommended for people who:  Currently smoke.  Have quit within the past 15 years.  Have at least a 30-pack-year history of smoking. A pack year is smoking an average of one pack of cigarettes a day for 1 year.  Yearly screening should continue until it has been 15 years since you quit.  Yearly screening should stop if you develop a health problem that would prevent you from having lung cancer treatment.  Breast Cancer  Practice breast self-awareness. This means understanding how your breasts normally appear and feel.  It also means doing regular breast self-exams. Let your health care provider know about any changes, no matter how small.  If you are in your 20s or 30s, you should have a clinical breast exam (CBE) by a health care provider every 1-3 years as part of a regular health exam.  If you are 57 or older, have a CBE every year. Also consider having a breast X-ray (mammogram) every year.  If you have a family history of breast cancer, talk to your health  care provider about genetic screening.  If you are at high risk for breast cancer, talk to your health care provider about having an MRI and a mammogram every year.  Breast cancer gene (BRCA) assessment is recommended for women who have family members  with BRCA-related cancers. BRCA-related cancers include:  Breast.  Ovarian.  Tubal.  Peritoneal cancers.  Results of the assessment will determine the need for genetic counseling and BRCA1 and BRCA2 testing. Cervical Cancer Your health care provider may recommend that you be screened regularly for cancer of the pelvic organs (ovaries, uterus, and vagina). This screening involves a pelvic examination, including checking for microscopic changes to the surface of your cervix (Pap test). You may be encouraged to have this screening done every 3 years, beginning at age 37.  For women ages 46-65, health care providers may recommend pelvic exams and Pap testing every 3 years, or they may recommend the Pap and pelvic exam, combined with testing for human papilloma virus (HPV), every 5 years. Some types of HPV increase your risk of cervical cancer. Testing for HPV may also be done on women of any age with unclear Pap test results.  Other health care providers may not recommend any screening for nonpregnant women who are considered low risk for pelvic cancer and who do not have symptoms. Ask your health care provider if a screening pelvic exam is right for you.  If you have had past treatment for cervical cancer or a condition that could lead to cancer, you need Pap tests and screening for cancer for at least 20 years after your treatment. If Pap tests have been discontinued, your risk factors (such as having a new sexual partner) need to be reassessed to determine if screening should resume. Some women have medical problems that increase the chance of getting cervical cancer. In these cases, your health care provider may recommend more frequent screening and Pap tests. Colorectal Cancer  This type of cancer can be detected and often prevented.  Routine colorectal cancer screening usually begins at 76 years of age and continues through 76 years of age.  Your health care provider may recommend  screening at an earlier age if you have risk factors for colon cancer.  Your health care provider may also recommend using home test kits to check for hidden blood in the stool.  A small camera at the end of a tube can be used to examine your colon directly (sigmoidoscopy or colonoscopy). This is done to check for the earliest forms of colorectal cancer.  Routine screening usually begins at age 40.  Direct examination of the colon should be repeated every 5-10 years through 76 years of age. However, you may need to be screened more often if early forms of precancerous polyps or small growths are found. Skin Cancer  Check your skin from head to toe regularly.  Tell your health care provider about any new moles or changes in moles, especially if there is a change in a mole's shape or color.  Also tell your health care provider if you have a mole that is larger than the size of a pencil eraser.  Always use sunscreen. Apply sunscreen liberally and repeatedly throughout the day.  Protect yourself by wearing long sleeves, pants, a wide-brimmed hat, and sunglasses whenever you are outside. HEART DISEASE, DIABETES, AND HIGH BLOOD PRESSURE   High blood pressure causes heart disease and increases the risk of stroke. High blood pressure is more likely to develop in:  People  who have blood pressure in the high end of the normal range (130-139/85-89 mm Hg).  People who are overweight or obese.  People who are African American.  If you are 85-80 years of age, have your blood pressure checked every 3-5 years. If you are 8 years of age or older, have your blood pressure checked every year. You should have your blood pressure measured twice--once when you are at a hospital or clinic, and once when you are not at a hospital or clinic. Record the average of the two measurements. To check your blood pressure when you are not at a hospital or clinic, you can use:  An automated blood pressure machine at a  pharmacy.  A home blood pressure monitor.  If you are between 72 years and 83 years old, ask your health care provider if you should take aspirin to prevent strokes.  Have regular diabetes screenings. This involves taking a blood sample to check your fasting blood sugar level.  If you are at a normal weight and have a low risk for diabetes, have this test once every three years after 76 years of age.  If you are overweight and have a high risk for diabetes, consider being tested at a younger age or more often. PREVENTING INFECTION  Hepatitis B  If you have a higher risk for hepatitis B, you should be screened for this virus. You are considered at high risk for hepatitis B if:  You were born in a country where hepatitis B is common. Ask your health care provider which countries are considered high risk.  Your parents were born in a high-risk country, and you have not been immunized against hepatitis B (hepatitis B vaccine).  You have HIV or AIDS.  You use needles to inject street drugs.  You live with someone who has hepatitis B.  You have had sex with someone who has hepatitis B.  You get hemodialysis treatment.  You take certain medicines for conditions, including cancer, organ transplantation, and autoimmune conditions. Hepatitis C  Blood testing is recommended for:  Everyone born from [birth year] through 1966.  Anyone with known risk factors for hepatitis C. Sexually transmitted infections (STIs)  You should be screened for sexually transmitted infections (STIs) including gonorrhea and chlamydia if:  You are sexually active and are younger than 76 years of age.  You are older than 76 years of age and your health care provider tells you that you are at risk for this type of infection.  Your sexual activity has changed since you were last screened and you are at an increased risk for chlamydia or gonorrhea. Ask your health care provider if you are at risk.  If you do not  have HIV, but are at risk, it may be recommended that you take a prescription medicine daily to prevent HIV infection. This is called pre-exposure prophylaxis (PrEP). You are considered at risk if:  You are sexually active and do not regularly use condoms or know the HIV status of your partner(s).  You take drugs by injection.  You are sexually active with a partner who has HIV. Talk with your health care provider about whether you are at high risk of being infected with HIV. If you choose to begin PrEP, you should first be tested for HIV. You should then be tested every 3 months for as long as you are taking PrEP.  PREGNANCY   If you are premenopausal and you may become pregnant, ask your health care  provider about preconception counseling.  If you may become pregnant, take 400 to 800 micrograms (mcg) of folic acid every day.  If you want to prevent pregnancy, talk to your health care provider about birth control (contraception). OSTEOPOROSIS AND MENOPAUSE   Osteoporosis is a disease in which the bones lose minerals and strength with aging. This can result in serious bone fractures. Your risk for osteoporosis can be identified using a bone density scan.  If you are 21 years of age or older, or if you are at risk for osteoporosis and fractures, ask your health care provider if you should be screened.  Ask your health care provider whether you should take a calcium or vitamin D supplement to lower your risk for osteoporosis.  Menopause may have certain physical symptoms and risks.  Hormone replacement therapy may reduce some of these symptoms and risks. Talk to your health care provider about whether hormone replacement therapy is right for you.  HOME CARE INSTRUCTIONS   Schedule regular health, dental, and eye exams.  Stay current with your immunizations.   Do not use any tobacco products including cigarettes, chewing tobacco, or electronic cigarettes.  If you are pregnant, do not  drink alcohol.  If you are breastfeeding, limit how much and how often you drink alcohol.  Limit alcohol intake to no more than 1 drink per day for nonpregnant women. One drink equals 12 ounces of beer, 5 ounces of wine, or 1 ounces of hard liquor.  Do not use street drugs.  Do not share needles.  Ask your health care provider for help if you need support or information about quitting drugs.  Tell your health care provider if you often feel depressed.  Tell your health care provider if you have ever been abused or do not feel safe at home.   This information is not intended to replace advice given to you by your health care provider. Make sure you discuss any questions you have with your health care provider.   Document Released: 05/06/2011 Document Revised: 11/11/2014 Document Reviewed: 09/22/2013 [employer]vier Interactive Patient Education [employer].

## 2016-07-10 ENCOUNTER — Other Ambulatory Visit: Payer: Self-pay | Admitting: Family Medicine

## 2016-07-10 DIAGNOSIS — Z961 Presence of intraocular lens: Secondary | ICD-10-CM | POA: Diagnosis not present

## 2016-07-10 DIAGNOSIS — H40013 Open angle with borderline findings, low risk, bilateral: Secondary | ICD-10-CM | POA: Diagnosis not present

## 2016-07-10 DIAGNOSIS — H35373 Puckering of macula, bilateral: Secondary | ICD-10-CM | POA: Diagnosis not present

## 2016-07-10 DIAGNOSIS — H35341 Macular cyst, hole, or pseudohole, right eye: Secondary | ICD-10-CM | POA: Diagnosis not present

## 2016-07-10 LAB — HM DIABETES EYE EXAM

## 2016-07-15 ENCOUNTER — Other Ambulatory Visit: Payer: Self-pay | Admitting: Family Medicine

## 2016-07-18 ENCOUNTER — Encounter: Payer: Self-pay | Admitting: Family Medicine

## 2016-07-18 DIAGNOSIS — J3089 Other allergic rhinitis: Secondary | ICD-10-CM | POA: Diagnosis not present

## 2016-07-18 DIAGNOSIS — J301 Allergic rhinitis due to pollen: Secondary | ICD-10-CM | POA: Diagnosis not present

## 2016-07-25 DIAGNOSIS — J301 Allergic rhinitis due to pollen: Secondary | ICD-10-CM | POA: Diagnosis not present

## 2016-07-25 DIAGNOSIS — J3089 Other allergic rhinitis: Secondary | ICD-10-CM | POA: Diagnosis not present

## 2016-07-30 ENCOUNTER — Ambulatory Visit: Payer: PPO | Admitting: Family Medicine

## 2016-08-01 DIAGNOSIS — J3089 Other allergic rhinitis: Secondary | ICD-10-CM | POA: Diagnosis not present

## 2016-08-01 DIAGNOSIS — J301 Allergic rhinitis due to pollen: Secondary | ICD-10-CM | POA: Diagnosis not present

## 2016-08-06 ENCOUNTER — Emergency Department (HOSPITAL_COMMUNITY)
Admission: EM | Admit: 2016-08-06 | Discharge: 2016-08-06 | Disposition: A | Discharge status: Home / Self Care | Payer: PPO | Attending: Emergency Medicine | Admitting: Emergency Medicine

## 2016-08-06 ENCOUNTER — Encounter (HOSPITAL_COMMUNITY): Payer: Self-pay | Admitting: Emergency Medicine

## 2016-08-06 DIAGNOSIS — Z7982 Long term (current) use of aspirin: Secondary | ICD-10-CM | POA: Diagnosis not present

## 2016-08-06 DIAGNOSIS — I10 Essential (primary) hypertension: Secondary | ICD-10-CM | POA: Diagnosis not present

## 2016-08-06 DIAGNOSIS — Z85820 Personal history of malignant melanoma of skin: Secondary | ICD-10-CM | POA: Insufficient documentation

## 2016-08-06 DIAGNOSIS — J45909 Unspecified asthma, uncomplicated: Secondary | ICD-10-CM | POA: Insufficient documentation

## 2016-08-06 NOTE — Discharge Instructions (Signed)
Please increase your Lasix dose to 40 mg daily and continue all your other medications as prescribed.

## 2016-08-06 NOTE — ED Notes (Signed)
Placed patient into a gown on the monitor

## 2016-08-06 NOTE — ED Provider Notes (Signed)
[facility] DEPT Provider Note   CSN: [id number] Arrival date & time: 08/06/16  0946     History   Chief Complaint Chief Complaint  Patient presents with  . Hypertension    HPI Brianna Mccarty is a 75 y.o. female.  75yo F w/ PMH including HTN, CAD, HLD who p/w hypertension. Pt has been having elevated BPs at home for the past 3 weeks despite being compliant with her medications. She saw her PCP a few weeks ago and was maintained on current medication regimen. This morning, her sBP was over 200 before she took her meds; she took her usual minipress and norvasc as well as a dose of clonidine which she has only for severe HTN. She denies any CP, SOB, headache, recent illness, or other symptoms. She has no complaints. Her BP has improved since taking the medications.   The history is provided by the patient.  Hypertension     Past Medical History:  Diagnosis Date  . Abnormal EKG    Normal LV function in the past  . Allergic rhinitis   . Asthma   . Bronchitis, mucopurulent recurrent ([place])   . Carotid artery disease ([place])    Doppler, April, 2009, 0-39% bilateral  . Cervical dysplasia 1971  . Chest pain, unspecified   . Diverticulosis of colon   . DJD (degenerative joint disease)   . Ejection fraction    EF 60%, echo, 2009  . GERD (gastroesophageal reflux disease)   . Headache(784.0)   . Hypercholesterolemia   . Hypertension   . Interstitial cystitis    sees urologist  . Lichen sclerosus   . Malignant melanoma ([place])    sees Dr. [place] Crane in dermatology  . Migraines   . Mitral valve disease    Question mitral valve prolapse in the past, no prolapse by echo 2009  . Murmur 10/20/2015  . Osteoporosis    on fosomax > 5 years, stopped 11/2015  . Renal calculus    sees urologist  . Thyroid cyst    1 x 1.1 thyroid cyst noted on carotid Doppler, January, 2012  . Venous insufficiency     Patient Active Problem List   Diagnosis Date Noted  . H/O cold sores 11/27/2015  .  Murmur 10/20/2015  . Carotid artery disease ([place])   . Thyroid cyst   . Venous (peripheral) insufficiency 06/02/2009  . Asthma 04/27/2008  . Melanoma of skin ([place]) 02/10/2008  . Allergic rhinitis 02/10/2008  . INTERSTITIAL CYSTITIS 02/10/2008  . HYPERCHOLESTEROLEMIA 02/09/2008  . Essential hypertension 02/09/2008  . GERD 02/09/2008  . OSTEOPOROSIS 02/09/2008    Past Surgical History:  Procedure Laterality Date  . BREAST SURGERY  2013   Breast Bx-Benign  . CARDIAC CATHETERIZATION    . CATARACT EXTRACTION, BILATERAL    . cystoscopy and basket stone removal right ureter  02/2006   Dr. Terance Hart  . GYNECOLOGIC CRYOSURGERY  1971  . left total hip replacement  2004   Dr. Gladstone Lighter  . melanoma removed from medial rleft knee area  2006   Dr. [place] Crane  . NASAL SEPTUM SURGERY      OB History    Gravida Para Term Preterm AB Living   2 2 2     2    SAB TAB Ectopic Multiple Live Births                   Home Medications    Prior to Admission medications   Medication Sig Start Date End Date Taking?  Authorizing Provider  acyclovir (ZOVIRAX) 400 MG tablet 400mg  tid x5 days for outbreak 11/27/15   Lucretia Kern, DO  albuterol (PROAIR HFA) 108 (90 BASE) MCG/ACT inhaler Inhale 2 puffs into the lungs every 6 (six) hours as needed for wheezing. 06/23/15   Noralee Space, MD  albuterol (PROVENTIL) (2.5 MG/3ML) 0.083% nebulizer solution Take 3 mLs (2.5 mg total) by nebulization every 6 (six) hours as needed. 06/23/15 01/31/17  Noralee Space, MD  amLODipine (NORVASC) 10 MG tablet TAKE 1 TABLET BY MOUTH EVERY DAY 04/19/16   Lucretia Kern, DO  Ascorbic Acid (VITAMIN C) 500 MG tablet Take 500 mg by mouth daily.      Historical Provider, MD  aspirin 81 MG tablet Take 81 mg by mouth daily.      Historical Provider, MD  budesonide-formoterol (SYMBICORT) 160-4.5 MCG/ACT inhaler Inhale 2 puffs into the lungs 2 (two) times daily. 11/27/15   Lucretia Kern, DO  Calcium Carbonate (CALCIUM 600) 1500 MG TABS Take 1 tablet  by mouth daily.      Historical Provider, MD  Cholecalciferol (VITAMIN D3) 5000 UNITS CAPS Take 1 capsule by mouth daily.      Historical Provider, MD  Cinnamon 500 MG TABS Take by mouth. With Chromium    Historical Provider, MD  clobetasol cream (TEMOVATE) [id number] % Apply 1 application topically at bedtime as needed. 07/03/15   Anastasio Auerbach, MD  Coenzyme Q10 (COQ10) 100 MG CAPS Take 1 capsule by mouth daily.      Historical Provider, MD  desmopressin (DDAVP) 0.2 MG tablet Take 3 tablets by mouth daily at bedtime     Historical Provider, MD  furosemide (LASIX) 20 MG tablet Take 1 tablet (20 mg total) by mouth daily. 12/22/15   Dorothy Spark, MD  hydrOXYzine (ATARAX/VISTARIL) 10 MG tablet Take 10 mg by mouth daily.    Historical Provider, MD  losartan (COZAAR) 100 MG tablet TAKE 1 TABLET (100 MG TOTAL) BY MOUTH DAILY. 07/10/16   Lucretia Kern, DO  magnesium oxide (MAG-OX) 400 MG tablet Take 400 mg by mouth daily.      Historical Provider, MD  Multiple Vitamin (MULTIVITAMIN) capsule Take 1 capsule by mouth daily.      Historical Provider, MD  omeprazole (PRILOSEC) 40 MG capsule TAKE 1 CAPSULE (40 MG TOTAL) BY MOUTH DAILY. 05/19/15   Lucretia Kern, DO  OVER THE COUNTER MEDICATION Bone growth factor    Historical Provider, MD  OVER THE COUNTER MEDICATION 3 oz daily. Nopolea    Historical Provider, MD  OVER THE COUNTER MEDICATION Ocuvite 50 +    Historical Provider, MD  OVER THE COUNTER MEDICATION Juice plus    Historical Provider, MD  pentosan polysulfate (ELMIRON) 100 MG capsule Take 100 mg by mouth 3 (three) times daily before meals.      Historical Provider, MD  pravastatin (PRAVACHOL) 20 MG tablet Take 1 tablet (20 mg total) by mouth every evening. 10/20/15   Dorothy Spark, MD  prazosin (MINIPRESS) 5 MG capsule TAKE 1 CAPSULE (5 MG TOTAL) BY MOUTH 2 (TWO) TIMES DAILY. 07/15/16   Lucretia Kern, DO  PRESCRIPTION MEDICATION Allergy shots every week    Historical Provider, MD  Probiotic Product  (PROBIOTIC PO) 1 daily    Historical Provider, MD    Family History Family History  Problem Relation Age of Onset  . Cancer Father     Pancreatic  . Diabetes Mother   . Heart disease Mother   .  Cancer Brother     Bile duct  . Diabetes Brother   . Hypertension Brother   . Diabetes Brother   . Heart disease Brother   . Hypertension Brother   . Hypertension Sister   . Hypertension Sister   . Colon cancer Paternal Uncle     Social History Social History  Substance Use Topics  . Smoking status: Never Smoker  . Smokeless tobacco: Never Used  . Alcohol use 1.8 oz/week    3 Standard drinks or equivalent per week     Comment: social use     Allergies   Cephalexin; Penicillins; Phenobarbital; and Tape   Review of Systems Review of Systems 10 Systems reviewed and are negative for acute change except as noted in the HPI.   Physical Exam Updated Vital Signs BP 152/76   Pulse (!) 55   Temp 97.7 F (36.5 C) (Oral)   Resp 13   Ht 5\' 2"  (1.575 m)   Wt 186 lb (84.4 kg)   SpO2 99%   BMI 34.02 kg/m   Physical Exam  Constitutional: She is oriented to person, place, and time. She appears well-developed and well-nourished. No distress.  HENT:  Head: Normocephalic and atraumatic.  Moist mucous membranes  Eyes: Conjunctivae are normal. Pupils are equal, round, and reactive to light.  Neck: Neck supple.  Cardiovascular: Normal rate and regular rhythm.   Murmur heard.  Systolic murmur is present with a grade of 2/6  Pulmonary/Chest: Effort normal and breath sounds normal.  Abdominal: Soft. Bowel sounds are normal. She exhibits no distension. There is no tenderness.  Musculoskeletal: She exhibits edema (1+ BLE).  Neurological: She is alert and oriented to person, place, and time.  Fluent speech  Skin: Skin is warm and dry.  Psychiatric: She has a normal mood and affect. Judgment normal.  Nursing note and vitals reviewed.    ED Treatments / Results  Labs (all labs  ordered are listed, but only abnormal results are displayed) Labs Reviewed - No data to display  EKG  EKG Interpretation  Date/Time:  Tuesday August 06 2016 10:13:28 EDT Ventricular Rate:  65 PR Interval:    QRS Duration: 98 QT Interval:  450 QTC Calculation: 468 R Axis:   -35 Text Interpretation:  Sinus rhythm Left axis deviation Low voltage, extremity and precordial leads Consider anterior infarct Nonspecific T abnormalities, lateral leads baseline artifact limits interpretation No significant change since last tracing Confirmed by Davari Lopes MD, Otila Starn [id number]) on 08/06/2016 10:44:34 AM Also confirmed by Lavonta Tillis MD, Paizlie Klaus [id number]), editor [place]  CCT, [place] ([id number])  on 08/06/2016 11:00:44 AM       Radiology No results found.  Procedures Procedures (including critical care time)  Medications Ordered in ED Medications - No data to display   Initial Impression / Assessment and Plan / ED Course  I have reviewed the triage vital signs and the nursing notes.  Clinical Course   Pt w/ elevated BP, long hx of HTN, complaint with medications and asymptomatic on exam. Well appearing. Initial BP 179/90, then 162/92. EKG without acute ischemic changes. I discussed the patient's presentation with her primary care provider, Dr. Maudie Mercury, appreciate her assistance with patient's care. We discussed several options and I relayed these w/ pt. to mentally, we decided to go back on low original Lasix dose of 40 mg daily and patient will follow-up at her previously scheduled appointment in 5 days for creatinine check and BP recheck. Pt voiced understanding of plan. Discussed return precautions and  patient discharged in satisfactory condition.  Final Clinical Impressions(s) / ED Diagnoses   Final diagnoses:  None    New Prescriptions New Prescriptions   No medications on file     Sharlett Iles, MD 08/06/16 1116

## 2016-08-06 NOTE — ED Triage Notes (Signed)
Patient states her blood pressure has been running high.   Patient denies other problems.   Patient states she took minipress, norvasc and clonidine this morning.  Patient states systolic over [id number] before the clonidine.

## 2016-08-06 NOTE — ED Notes (Signed)
Papers reviewed with patient and husband. Denies any questions and leaving with no pain.

## 2016-08-07 DIAGNOSIS — J301 Allergic rhinitis due to pollen: Secondary | ICD-10-CM | POA: Diagnosis not present

## 2016-08-07 DIAGNOSIS — J3089 Other allergic rhinitis: Secondary | ICD-10-CM | POA: Diagnosis not present

## 2016-08-08 ENCOUNTER — Encounter: Payer: Self-pay | Admitting: Family Medicine

## 2016-08-08 ENCOUNTER — Ambulatory Visit (INDEPENDENT_AMBULATORY_CARE_PROVIDER_SITE_OTHER): Payer: PPO | Admitting: Family Medicine

## 2016-08-08 VITALS — BP 132/78 | HR 71 | Temp 97.8°F | Ht 62.0 in | Wt 187.3 lb

## 2016-08-08 DIAGNOSIS — I10 Essential (primary) hypertension: Secondary | ICD-10-CM | POA: Diagnosis not present

## 2016-08-08 DIAGNOSIS — R899 Unspecified abnormal finding in specimens from other organs, systems and tissues: Secondary | ICD-10-CM | POA: Diagnosis not present

## 2016-08-08 NOTE — Progress Notes (Signed)
HPI:  Follow up::  HTN: -sees cardiologist for this, and hx carotid art dz, HLD, dilated at[facility] -meds: amlodipine 10, lasic 20mg , losartan 100mg , prazosin (started at age 75 per her report) -she went to the ER for elevated asymptomatic BP 10/3 (ER notes say I saw pt a few weeks ago - but I did not) -BP recorded in the ER is 152/76, a79/90 and 162/92 - per notes EKG ok - Per ER notes lasix increased and pt here for recheck and check of labs -reports: BP better since, check BP frequently as is very anxious about her BP as has FH heart dz, family member passed suddenly 2 weeks ago -denies:cp, sob, die, swelling, ha, vision changes -exercising and eating healthy -has follow up with her cardiologist in a few weeks   ROS: See pertinent positives and negatives per HPI.  Past Medical History:  Diagnosis Date  . Abnormal EKG    Normal LV function in the past  . Allergic rhinitis   . Asthma   . Bronchitis, mucopurulent recurrent ([place])   . Carotid artery disease ([place])    Doppler, April, 2009, 0-39% bilateral  . Cervical dysplasia 1971  . Chest pain, unspecified   . Diverticulosis of colon   . DJD (degenerative joint disease)   . Ejection fraction    EF 60%, echo, 2009  . GERD (gastroesophageal reflux disease)   . Headache(784.0)   . Hypercholesterolemia   . Hypertension   . Interstitial cystitis    sees urologist  . Lichen sclerosus   . Malignant melanoma ([place])    sees Dr. Nevada Crane in dermatology  . Migraines   . Mitral valve disease    Question mitral valve prolapse in the past, no prolapse by echo 2009  . Murmur 10/20/2015  . Osteoporosis    on fosomax > 5 years, stopped 11/2015  . Renal calculus    sees urologist  . Thyroid cyst    1 x 1.1 thyroid cyst noted on carotid Doppler, January, 2012  . Venous insufficiency     Past Surgical History:  Procedure Laterality Date  . BREAST SURGERY  2013   Breast Bx-Benign  . CARDIAC CATHETERIZATION    . CATARACT EXTRACTION,  BILATERAL    . cystoscopy and basket stone removal right ureter  02/2006   Dr. Terance Hart  . GYNECOLOGIC CRYOSURGERY  1971  . left total hip replacement  2004   Dr. Gladstone Lighter  . melanoma removed from medial rleft knee area  2006   Dr. [place] Crane  . NASAL SEPTUM SURGERY      Family History  Problem Relation Age of Onset  . Cancer Father     Pancreatic  . Diabetes Mother   . Heart disease Mother   . Cancer Brother     Bile duct  . Diabetes Brother   . Hypertension Brother   . Diabetes Brother   . Heart disease Brother   . Hypertension Brother   . Hypertension Sister   . Hypertension Sister   . Colon cancer Paternal Uncle     Social History   Social History  . Marital status: Married    Spouse name: Edison Nasuti  . Number of children: 2  . Years of education: N/A   Occupational History  . retired Retired   Social History Main Topics  . Smoking status: Never Smoker  . Smokeless tobacco: Never Used  . Alcohol use 1.8 oz/week    3 Standard drinks or equivalent per week  Comment: social use  . Drug use: No  . Sexual activity: No     Comment: 1st intercourse 14 yo-1 partner   Other Topics Concern  . None   Social History Narrative   Updated 11/2015   Work or School: none      Home Situation: lives with husband      Spiritual Beliefs: christian      Lifestyle: regular exercise; healthy diet           Current Outpatient Prescriptions:  .  acyclovir (ZOVIRAX) 400 MG tablet, 400mg  tid x5 days for outbreak, Disp: 15 tablet, Rfl: 3 .  albuterol (PROAIR HFA) 108 (90 BASE) MCG/ACT inhaler, Inhale 2 puffs into the lungs every 6 (six) hours as needed for wheezing., Disp: 3 Inhaler, Rfl: 0 .  albuterol (PROVENTIL) (2.5 MG/3ML) 0.083% nebulizer solution, Take 3 mLs (2.5 mg total) by nebulization every 6 (six) hours as needed., Disp: 360 mL, Rfl: 0 .  amLODipine (NORVASC) 10 MG tablet, TAKE 1 TABLET BY MOUTH EVERY DAY, Disp: 90 tablet, Rfl: 0 .  Ascorbic Acid (VITAMIN C) 500 MG  tablet, Take 500 mg by mouth daily.  , Disp: , Rfl:  .  aspirin 81 MG tablet, Take 81 mg by mouth daily.  , Disp: , Rfl:  .  budesonide-formoterol (SYMBICORT) 160-4.5 MCG/ACT inhaler, Inhale 2 puffs into the lungs 2 (two) times daily., Disp: 3 Inhaler, Rfl: 3 .  Calcium Carbonate (CALCIUM 600) 1500 MG TABS, Take 1 tablet by mouth daily.  , Disp: , Rfl:  .  Cholecalciferol (VITAMIN D3) 5000 UNITS CAPS, Take 1 capsule by mouth daily.  , Disp: , Rfl:  .  Cinnamon 500 MG TABS, Take by mouth. With Chromium, Disp: , Rfl:  .  clobetasol cream (TEMOVATE) [id number] %, Apply 1 application topically at bedtime as needed., Disp: 30 g, Rfl: 1 .  Coenzyme Q10 (COQ10) 100 MG CAPS, Take 1 capsule by mouth daily.  , Disp: , Rfl:  .  desmopressin (DDAVP) 0.2 MG tablet, Take 3 tablets by mouth daily at bedtime , Disp: , Rfl:  .  furosemide (LASIX) 40 MG tablet, Take 40 mg by mouth daily. , Disp: , Rfl:  .  hydrOXYzine (ATARAX/VISTARIL) 10 MG tablet, Take 10 mg by mouth daily., Disp: , Rfl:  .  losartan (COZAAR) 100 MG tablet, TAKE 1 TABLET (100 MG TOTAL) BY MOUTH DAILY., Disp: 90 tablet, Rfl: 1 .  magnesium oxide (MAG-OX) 400 MG tablet, Take 400 mg by mouth daily.  , Disp: , Rfl:  .  Multiple Vitamin (MULTIVITAMIN) capsule, Take 1 capsule by mouth daily.  , Disp: , Rfl:  .  omeprazole (PRILOSEC) 40 MG capsule, TAKE 1 CAPSULE (40 MG TOTAL) BY MOUTH DAILY., Disp: 30 capsule, Rfl: 3 .  OVER THE COUNTER MEDICATION, Bone growth factor, Disp: , Rfl:  .  OVER THE COUNTER MEDICATION, 3 oz daily. Nopolea, Disp: , Rfl:  .  OVER THE COUNTER MEDICATION, Ocuvite 50 +, Disp: , Rfl:  .  OVER THE COUNTER MEDICATION, Juice plus, Disp: , Rfl:  .  pentosan polysulfate (ELMIRON) 100 MG capsule, Take 100 mg by mouth 3 (three) times daily before meals.  , Disp: , Rfl:  .  pravastatin (PRAVACHOL) 20 MG tablet, Take 1 tablet (20 mg total) by mouth every evening., Disp: 90 tablet, Rfl: 3 .  prazosin (MINIPRESS) 5 MG capsule, TAKE 1 CAPSULE (5  MG TOTAL) BY MOUTH 2 (TWO) TIMES DAILY., Disp: 180 capsule, Rfl: 1 .  PRESCRIPTION MEDICATION, Allergy shots every week, Disp: , Rfl:  .  Probiotic Product (PROBIOTIC PO), 1 daily, Disp: , Rfl:   EXAM:  Vitals:   08/08/16 1415  BP: 132/78  Pulse: 71  Temp: 97.8 F (36.6 C)    Body mass index is 34.26 kg/m.  GENERAL: vitals reviewed and listed above, alert, oriented, appears well hydrated and in no acute distress  HEENT: atraumatic, conjunttiva clear, no obvious abnormalities on inspection of external nose and ears  NECK: no obvious masses on inspection  LUNGS: clear to auscultation bilaterally, no wheezes, rales or rhonchi, good air movement  CV: HRRR, no peripheral edema  MS: moves all extremities without noticeable abnormality  PSYCH: pleasant and cooperative, no obvious depression or anxiety  ASSESSMENT AND PLAN:  Discussed the following assessment and plan: More than 50% of over 25 minutes spent in total in caring for this patient was spent face-to-face with the patient, counseling and/or coordinating care.   Essential hypertension - Plan: Basic metabolic panel, CBC (no diff)  -cont current tx -bp looks ok today -ck labs, continue to monitor at home but advise 3 days per week, average of 3 seated BPs -follow up with cardiologist as planned and further adjustment in meds as needed -Patient advised to return or notify a doctor immediately if symptoms worsen or persist or new concerns arise.  Patient Instructions  BEFORE YOU LEAVE: -follow up: 3 months or sooner if concerns -labs  Continue the higher dose of lasix  On 3 days per week, check blood pressure 3 times, one minute a part and keep average. Take log to appointment with your cardiologist. Call if any concerns.  We recommend the following healthy lifestyle for LIFE: 1) Small portions.   Tip: eat off of a salad plate instead of a dinner plate.  Tip: It is ok to feel hungry after a meal - that likely  means you ate an appropriate portion.  Tip: if you need more or a snack choose fruits, veggies and/or a handful of nuts or seeds.  2) Eat a healthy clean diet.  * Tip: Avoid (less then 1 serving per week): processed foods, sweets, sweetened drinks, white starches (rice, flour, bread, potatoes, pasta, etc), red meat, fast foods, butter  *Tip: CHOOSE instead   * 5-9 servings per day of fresh or frozen fruits and vegetables (but not corn, potatoes, bananas, canned or dried fruit)   *nuts and seeds, beans   *olives and olive oil   *small portions of lean meats such as fish and white chicken    *small portions of whole grains  3)Get at least 150 minutes of sweaty aerobic exercise per week.  4)Reduce stress - consider counseling, meditation and relaxation to balance other aspects of your life.     Colin Benton R., DO

## 2016-08-08 NOTE — Patient Instructions (Addendum)
BEFORE YOU LEAVE: -follow up: 3 months or sooner if concerns -labs  Continue the higher dose of lasix  On 3 days per week, check blood pressure 3 times, one minute a part and keep average. Take log to appointment with your cardiologist. Call if any concerns.  We recommend the following healthy lifestyle for LIFE: 1) Small portions.   Tip: eat off of a salad plate instead of a dinner plate.  Tip: It is ok to feel hungry after a meal - that likely means you ate an appropriate portion.  Tip: if you need more or a snack choose fruits, veggies and/or a handful of nuts or seeds.  2) Eat a healthy clean diet.  * Tip: Avoid (less then 1 serving per week): processed foods, sweets, sweetened drinks, white starches (rice, flour, bread, potatoes, pasta, etc), red meat, fast foods, butter  *Tip: CHOOSE instead   * 5-9 servings per day of fresh or frozen fruits and vegetables (but not corn, potatoes, bananas, canned or dried fruit)   *nuts and seeds, beans   *olives and olive oil   *small portions of lean meats such as fish and white chicken    *small portions of whole grains  3)Get at least 150 minutes of sweaty aerobic exercise per week.  4)Reduce stress - consider counseling, meditation and relaxation to balance other aspects of your life.

## 2016-08-08 NOTE — Progress Notes (Signed)
Pre visit review using our clinic review tool, if applicable. No additional management support is needed unless otherwise documented below in the visit note. 

## 2016-08-09 ENCOUNTER — Telehealth: Payer: Self-pay | Admitting: Family Medicine

## 2016-08-09 ENCOUNTER — Ambulatory Visit (INDEPENDENT_AMBULATORY_CARE_PROVIDER_SITE_OTHER): Payer: PPO | Admitting: Gynecology

## 2016-08-09 ENCOUNTER — Encounter: Payer: Self-pay | Admitting: Gynecology

## 2016-08-09 VITALS — BP 130/84 | Ht 62.0 in | Wt 191.0 lb

## 2016-08-09 DIAGNOSIS — L900 Lichen sclerosus et atrophicus: Secondary | ICD-10-CM

## 2016-08-09 DIAGNOSIS — N952 Postmenopausal atrophic vaginitis: Secondary | ICD-10-CM

## 2016-08-09 DIAGNOSIS — Z01411 Encounter for gynecological examination (general) (routine) with abnormal findings: Secondary | ICD-10-CM

## 2016-08-09 DIAGNOSIS — M810 Age-related osteoporosis without current pathological fracture: Secondary | ICD-10-CM

## 2016-08-09 LAB — BASIC METABOLIC PANEL
BUN: 31 mg/dL — AB (ref 6–23)
CO2: 30 mEq/L (ref 19–32)
CREATININE: 0.89 mg/dL (ref 0.40–1.20)
Calcium: 10.6 mg/dL — ABNORMAL HIGH (ref 8.4–10.5)
Chloride: 99 mEq/L (ref 96–112)
GFR: 65.41 mL/min (ref >60.00)
Glucose, Bld: 71 mg/dL (ref 70–99)
Potassium: 4.2 mEq/L (ref 3.5–5.1)
Sodium: 137 mEq/L (ref 135–145)

## 2016-08-09 LAB — CBC
HCT: 37 % (ref 36.0–46.0)
Hemoglobin: 12.4 g/dL (ref 12.0–15.0)
MCHC: 33.5 g/dL (ref 30.0–36.0)
MCV: 86.1 fl (ref 78.0–100.0)
Platelets: 247 10*3/uL (ref 150.0–400.0)
RBC: 4.3 Mil/uL (ref 3.87–5.11)
RDW: 14.1 % (ref 11.5–15.5)
WBC: 6.3 10*3/uL (ref 4.0–10.5)

## 2016-08-09 NOTE — Telephone Encounter (Signed)
Brianna Mccarty pt returning your call and you can call back at your convenience.

## 2016-08-09 NOTE — Progress Notes (Signed)
    LAVENIA BUJOLD 08-09-1941 [id number]        75 y.o.  [id number]  for breast and pelvic exam  Past medical history,surgical history, problem list, medications, allergies, family history and social history were all reviewed and documented as reviewed in the EPIC chart.  ROS:  Performed with pertinent positives and negatives included in the history, assessment and plan.   Additional significant findings :  None   Exam: [name] assistant Vitals:   08/09/16 1403  BP: 130/84  Weight: 191 lb (86.6 kg)  Height: 5\' 2"  (1.575 m)   Body mass index is 34.93 kg/m.  General appearance:  Normal affect, orientation and appearance. Skin: Grossly normal HEENT: Without gross lesions.  No cervical or supraclavicular adenopathy. Thyroid normal.  Lungs:  Clear without wheezing, rales or rhonchi Cardiac: RR, without RMG Abdominal:  Soft, nontender, without masses, guarding, rebound, organomegaly or hernia Breasts:  Examined lying and sitting without masses, retractions, discharge or axillary adenopathy. Pelvic:  Ext, BUS, Vagina ith atrophic changes. Slight blanching of the skin lateral to the posterior fourchette bilaterally consistent with her history of lichen sclerosus.  Cervix with atrophic changes  Uterus anteverted, normal size, shape and contour, midline and mobile nontender   Adnexa without masses or tenderness    Anus and perineum normal   Rectovaginal normal sphincter tone without palpated masses or tenderness.    Assessment/Plan:  75 y.o. [id number] female for breast and pelvic exam.   1. Postmenopausal/atrophic genital changes. No significant hot flushes, night sweats, vaginal dryness or any vaginal bleeding. Continue to monitor report any issues or bleeding. 2. Lichen sclerosus. Uses Temovate cream with good results. Does not need prescription now but will call. 3. Osteoporosis. Being followed by Dr. Maudie Mercury. Had been on Fosamax but now on drug-free holiday. She will continue to  follow up with her in reference to this. 4. Mammography 12/2015. Continue with annual mammography when due. SBE monthly reviewed. 5. Colonoscopy 2016. Repeat at their recommended interval. 6. Pap smear 2016. No Pap smear done today. History of cryosurgery 40 years ago with normal Pap smears afterwards. Options to stop screening versus less frequent screening intervals per current screening guidelines reviewed. Will readdress on annual basis. 7. Health maintenance. No routine lab work done as this is done at Dr. Julianne Rice office. Follow up 1 year, sooner as needed.   Anastasio Auerbach MD, 2:23 PM 08/09/2016

## 2016-08-09 NOTE — Patient Instructions (Signed)

## 2016-08-12 ENCOUNTER — Ambulatory Visit: Payer: PPO | Admitting: Family Medicine

## 2016-08-12 NOTE — Addendum Note (Signed)
Addended by: Agnes Lawrence on: 08/12/2016 05:54 PM   Modules accepted: Orders

## 2016-08-13 DIAGNOSIS — J3089 Other allergic rhinitis: Secondary | ICD-10-CM | POA: Diagnosis not present

## 2016-08-13 DIAGNOSIS — J301 Allergic rhinitis due to pollen: Secondary | ICD-10-CM | POA: Diagnosis not present

## 2016-08-13 NOTE — Telephone Encounter (Signed)
See results note. 

## 2016-08-18 ENCOUNTER — Other Ambulatory Visit: Payer: Self-pay | Admitting: Family Medicine

## 2016-08-20 DIAGNOSIS — H6123 Impacted cerumen, bilateral: Secondary | ICD-10-CM | POA: Diagnosis not present

## 2016-08-20 DIAGNOSIS — J301 Allergic rhinitis due to pollen: Secondary | ICD-10-CM | POA: Diagnosis not present

## 2016-08-20 DIAGNOSIS — J3089 Other allergic rhinitis: Secondary | ICD-10-CM | POA: Diagnosis not present

## 2016-08-21 DIAGNOSIS — N3010 Interstitial cystitis (chronic) without hematuria: Secondary | ICD-10-CM | POA: Diagnosis not present

## 2016-08-27 ENCOUNTER — Encounter: Payer: Self-pay | Admitting: Family Medicine

## 2016-08-27 ENCOUNTER — Ambulatory Visit (INDEPENDENT_AMBULATORY_CARE_PROVIDER_SITE_OTHER): Payer: PPO | Admitting: Family Medicine

## 2016-08-27 VITALS — BP 118/70 | HR 90 | Temp 98.0°F | Ht 62.0 in | Wt 185.5 lb

## 2016-08-27 DIAGNOSIS — I739 Peripheral vascular disease, unspecified: Secondary | ICD-10-CM

## 2016-08-27 DIAGNOSIS — R195 Other fecal abnormalities: Secondary | ICD-10-CM | POA: Diagnosis not present

## 2016-08-27 DIAGNOSIS — I10 Essential (primary) hypertension: Secondary | ICD-10-CM

## 2016-08-27 DIAGNOSIS — I779 Disorder of arteries and arterioles, unspecified: Secondary | ICD-10-CM

## 2016-08-27 MED ORDER — CLONIDINE HCL 0.1 MG PO TABS: 0.1000 mg | ORAL_TABLET | Freq: Two times a day (BID) | ORAL | 3 refills | Status: DC

## 2016-08-27 NOTE — Progress Notes (Signed)
HPI:  Acute visit for several issues:  HTN: -longstanding, sees cardiologist for this, HLD, dilated atrium -meds: amlodipine 10, lasix 40, losartan 100, prazosin 5 -she has seen her BP creeping up over the last year and is very anxious about this - has been to the emergency room several times -has clonidine that she uses prn and feels this works beautifully to lower her BP -reports with home monitoring BP values in the 150-170s/90-100 consistently -reports BP 160/upper 90s today so she took clonidine a few hours ago -no CP, sob, swelling, HA  Loose bowels: -thinks has stomach but -started today -symptoms include intermittent abd pain, difuse, 4 episode loose bowels today -denies: hematochezia, watery stools, vomiting, inability to tolerate oral intake, fevers, rash, persistent or focal abd pain, recent travel or abx  Has lab follow up scheduled. Plans to follow up with cardiologist. ROS: See pertinent positives and negatives per HPI.  Past Medical History:  Diagnosis Date  . Abnormal EKG    Normal LV function in the past  . Allergic rhinitis   . Asthma   . Bronchitis, mucopurulent recurrent ([place])   . Carotid artery disease ([place])    Doppler, April, 2009, 0-39% bilateral  . Cervical dysplasia 1971  . Chest pain, unspecified   . Diverticulosis of colon   . DJD (degenerative joint disease)   . Ejection fraction    EF 60%, echo, 2009  . GERD (gastroesophageal reflux disease)   . Headache(784.0)   . Hypercholesterolemia   . Hypertension   . Interstitial cystitis    sees urologist  . Lichen sclerosus   . Malignant melanoma ([place])    sees Dr. Nevada Crane in dermatology  . Migraines   . Mitral valve disease    Question mitral valve prolapse in the past, no prolapse by echo 2009  . Murmur 10/20/2015  . Osteoporosis    on fosomax > 5 years, stopped 11/2015  . Renal calculus    sees urologist  . Thyroid cyst    1 x 1.1 thyroid cyst noted on carotid Doppler, January, 2012  .  Venous insufficiency     Past Surgical History:  Procedure Laterality Date  . BREAST SURGERY  2013   Breast Bx-Benign  . CARDIAC CATHETERIZATION    . CATARACT EXTRACTION, BILATERAL    . cystoscopy and basket stone removal right ureter  02/2006   Dr. Terance Hart  . GYNECOLOGIC CRYOSURGERY  1971  . left total hip replacement  2004   Dr. Gladstone Lighter  . melanoma removed from medial rleft knee area  2006   Dr. [place] Crane  . NASAL SEPTUM SURGERY      Family History  Problem Relation Age of Onset  . Cancer Father     Pancreatic  . Diabetes Mother   . Heart disease Mother   . Cancer Brother     Bile duct  . Diabetes Brother   . Hypertension Brother   . Diabetes Brother   . Heart disease Brother   . Hypertension Brother   . Hypertension Sister   . Hypertension Sister   . Colon cancer Paternal Uncle     Social History   Social History  . Marital status: Married    Spouse name: Edison Nasuti  . Number of children: 2  . Years of education: N/A   Occupational History  . retired Retired   Social History Main Topics  . Smoking status: Never Smoker  . Smokeless tobacco: Never Used  . Alcohol use 0.6 oz/week  1 Standard drinks or equivalent per week     Comment: social use  . Drug use: No  . Sexual activity: No     Comment: 1st intercourse 80 yo-1 partner   Other Topics Concern  . None   Social History Narrative   Updated 11/2015   Work or School: none      Home Situation: lives with husband      Spiritual Beliefs: christian      Lifestyle: regular exercise; healthy diet           Current Outpatient Prescriptions:  .  acyclovir (ZOVIRAX) 400 MG tablet, 400mg  tid x5 days for outbreak, Disp: 15 tablet, Rfl: 3 .  albuterol (PROAIR HFA) 108 (90 BASE) MCG/ACT inhaler, Inhale 2 puffs into the lungs every 6 (six) hours as needed for wheezing., Disp: 3 Inhaler, Rfl: 0 .  albuterol (PROVENTIL) (2.5 MG/3ML) 0.083% nebulizer solution, Take 3 mLs (2.5 mg total) by nebulization every 6  (six) hours as needed., Disp: 360 mL, Rfl: 0 .  amLODipine (NORVASC) 10 MG tablet, TAKE 1 TABLET BY MOUTH EVERY DAY, Disp: 90 tablet, Rfl: 0 .  Ascorbic Acid (VITAMIN C) 500 MG tablet, Take 500 mg by mouth daily.  , Disp: , Rfl:  .  aspirin 81 MG tablet, Take 81 mg by mouth daily.  , Disp: , Rfl:  .  budesonide-formoterol (SYMBICORT) 160-4.5 MCG/ACT inhaler, Inhale 2 puffs into the lungs 2 (two) times daily., Disp: 3 Inhaler, Rfl: 3 .  Calcium Carbonate (CALCIUM 600) 1500 MG TABS, Take 1 tablet by mouth daily.  , Disp: , Rfl:  .  Cholecalciferol (VITAMIN D3) 5000 UNITS CAPS, Take 1 capsule by mouth daily.  , Disp: , Rfl:  .  Cinnamon 500 MG TABS, Take by mouth. With Chromium, Disp: , Rfl:  .  clobetasol cream (TEMOVATE) [id number] %, Apply 1 application topically at bedtime as needed., Disp: 30 g, Rfl: 1 .  Coenzyme Q10 (COQ10) 100 MG CAPS, Take 1 capsule by mouth daily.  , Disp: , Rfl:  .  desmopressin (DDAVP) 0.2 MG tablet, Take 3 tablets by mouth daily at bedtime , Disp: , Rfl:  .  furosemide (LASIX) 40 MG tablet, Take 40 mg by mouth daily. , Disp: , Rfl:  .  hydrOXYzine (ATARAX/VISTARIL) 10 MG tablet, Take 10 mg by mouth daily., Disp: , Rfl:  .  losartan (COZAAR) 100 MG tablet, TAKE 1 TABLET (100 MG TOTAL) BY MOUTH DAILY., Disp: 90 tablet, Rfl: 1 .  magnesium oxide (MAG-OX) 400 MG tablet, Take 400 mg by mouth daily.  , Disp: , Rfl:  .  Multiple Vitamin (MULTIVITAMIN) capsule, Take 1 capsule by mouth daily.  , Disp: , Rfl:  .  omeprazole (PRILOSEC) 40 MG capsule, TAKE 1 CAPSULE (40 MG TOTAL) BY MOUTH DAILY., Disp: 30 capsule, Rfl: 3 .  omeprazole (PRILOSEC) 40 MG capsule, TAKE 1 CAPSULE (40 MG TOTAL) BY MOUTH DAILY., Disp: 90 capsule, Rfl: 1 .  OVER THE COUNTER MEDICATION, Bone growth factor, Disp: , Rfl:  .  OVER THE COUNTER MEDICATION, 3 oz daily. Nopolea, Disp: , Rfl:  .  OVER THE COUNTER MEDICATION, Ocuvite 50 +, Disp: , Rfl:  .  OVER THE COUNTER MEDICATION, Juice plus, Disp: , Rfl:  .   pentosan polysulfate (ELMIRON) 100 MG capsule, Take 100 mg by mouth 3 (three) times daily before meals.  , Disp: , Rfl:  .  pravastatin (PRAVACHOL) 20 MG tablet, Take 1 tablet (20 mg total) by mouth  every evening., Disp: 90 tablet, Rfl: 3 .  prazosin (MINIPRESS) 5 MG capsule, TAKE 1 CAPSULE (5 MG TOTAL) BY MOUTH 2 (TWO) TIMES DAILY., Disp: 180 capsule, Rfl: 1 .  PRESCRIPTION MEDICATION, Allergy shots every week, Disp: , Rfl:  .  Probiotic Product (PROBIOTIC PO), 1 daily, Disp: , Rfl:   EXAM:  Vitals:   08/27/16 1438  BP: 118/70  Pulse: 90  Temp: 98 F (36.7 C)    Body mass index is 33.93 kg/m.  GENERAL: vitals reviewed and listed above, alert, oriented, appears well hydrated and in no acute distress  HEENT: atraumatic, conjunttiva clear, no obvious abnormalities on inspection of external nose and ears  NECK: no obvious masses on inspection  LUNGS: clear to auscultation bilaterally, no wheezes, rales or rhonchi, good air movement  CV: HRRR, no peripheral edema  ABD: BS+, soft, NTTP, no rebound or guarding  MS: moves all extremities without noticeable abnormality  PSYCH: pleasant and cooperative, no obvious depression or anxiety  ASSESSMENT AND PLAN:  Discussed the following assessment and plan:  Essential hypertension Bilateral carotid artery disease (HCC) -discussed various other options for bp tx -she wants to try clonidine on a regular basis after discussion risks -follow up with cardiologist  Loose stools -we discussed possible serious and likely etiologies, workup and treatment, treatment risks and return precautions - benign exam -after this discussion, Mayfred opted for no dairy, imodium prn, oral intake fluids -of course, we advised Devena  to return or notify a doctor immediately if symptoms worsen or persist or new concerns arise.  Needs labs recheck and she agrees to keep appt for this.   -Patient advised to return or notify a doctor immediately if  symptoms worsen or persist or new concerns arise.  There are no Patient Instructions on file for this visit.  Colin Benton R., DO

## 2016-08-27 NOTE — Progress Notes (Signed)
Pre visit review using our clinic review tool, if applicable. No additional management support is needed unless otherwise documented below in the visit note. 

## 2016-08-27 NOTE — Patient Instructions (Addendum)
BEFORE YOU LEAVE: -follow up: as scheduled for labs -follow up with Dr. Maudie Mercury in 3 months (can cancel other follow up with Dr. Maudie Mercury)  Start the clonidine on a regular basis. Do not ever stop this medication suddenly. Follow up with your cardiologist about your blood pressure.  Drink plenty of fluids with electrolytes. No dairy for 1 week. Can use imdoium for any diarrhea. Seek care if worsening, not improving or other concerns.   We recommend the following healthy lifestyle for LIFE: 1) Small portions.   Tip: eat off of a salad plate instead of a dinner plate.  Tip: It is ok to feel hungry after a meal - that likely means you ate an appropriate portion.  Tip: if you need more or a snack choose fruits, veggies and/or a handful of nuts or seeds.  2) Eat a healthy clean diet.  * Tip: Avoid (less then 1 serving per week): processed foods, sweets, sweetened drinks, white starches (rice, flour, bread, potatoes, pasta, etc), red meat, fast foods, butter  *Tip: CHOOSE instead   * 5-9 servings per day of fresh or frozen fruits and vegetables (but not corn, potatoes, bananas, canned or dried fruit)   *nuts and seeds, beans   *olives and olive oil   *small portions of lean meats such as fish and white chicken    *small portions of whole grains  3)Get at least 150 minutes of sweaty aerobic exercise per week.  4)Reduce stress - consider counseling, meditation and relaxation to balance other aspects of your life.

## 2016-08-30 DIAGNOSIS — J301 Allergic rhinitis due to pollen: Secondary | ICD-10-CM | POA: Diagnosis not present

## 2016-08-30 DIAGNOSIS — J3089 Other allergic rhinitis: Secondary | ICD-10-CM | POA: Diagnosis not present

## 2016-09-02 ENCOUNTER — Encounter: Payer: Self-pay | Admitting: Cardiology

## 2016-09-02 ENCOUNTER — Ambulatory Visit (INDEPENDENT_AMBULATORY_CARE_PROVIDER_SITE_OTHER): Payer: PPO | Admitting: Cardiology

## 2016-09-02 ENCOUNTER — Encounter (INDEPENDENT_AMBULATORY_CARE_PROVIDER_SITE_OTHER): Payer: Self-pay

## 2016-09-02 VITALS — BP 154/80 | HR 64 | Ht 62.0 in | Wt 187.5 lb

## 2016-09-02 DIAGNOSIS — I10 Essential (primary) hypertension: Secondary | ICD-10-CM

## 2016-09-02 MED ORDER — HYDRALAZINE HCL 25 MG PO TABS
25.0000 mg | ORAL_TABLET | Freq: Three times a day (TID) | ORAL | 3 refills | Status: DC
Start: 2016-09-02 — End: 2016-09-19

## 2016-09-02 NOTE — Patient Instructions (Signed)
Medication Instructions: STOP Clonidine  START Hydralazine 25 mg three times daily.   Follow-Up: Your physician recommends that you schedule a follow-up appointment with pharmacy for BP check in 2-3 weeks ([place]).   If you need a refill on your cardiac medications before your next appointment, please call your pharmacy.

## 2016-09-02 NOTE — Progress Notes (Signed)
09/02/2016 Brianna Mccarty   December 01, 1940  [id number]  Primary Physician Lucretia Kern., DO Primary Cardiologist: Dr. Meda Coffee    Reason for Visit/CC: Hypertension    HPI:  The patient is a 75 year old female, who presents to clinic today for management of her hypertension. She is a former patient of Dr. Ron Parker. She is now being followed by Dr. Meda Coffee. In addition to her history of hypertension, she also has a history of hyperlipidemia and carotid artery disease. Bilateral Doppler studies in July 2017 showed minimal plaque with 0-39% ICA stenosis bilaterally.  She reports that for the last several months, she has had elevation of her blood pressure. She has had a moderate increase of stress in her life however she will reports that she has been adherent to low-sodium diet. No excessive caffeine. She does not smoke. She gets regular exercise. She walks on the treadmill 4 days a week.  She has been fully compliant with her medications. She is on multiple agents for blood pressure, which include 10 mg of amlodipine, 40 mg of Lasix, 100 mg of losartan. Most recently clonidine, 0.1 mg twice a day was started. This was initiated 3 weeks ago after she presented to the emergency department with the complaint of high blood pressure with systolic pressures in the 180s. Since starting the clonidine she has noticed some improvement in her blood pressure readings however her blood pressure remains moderately elevated. Blood pressure in clinic today is in the [id number] systolic. She has had issues with clonidine, particularly a very dry mouth. Her heart rate is in the 60s. She denies symptoms of chest pain, dyspnea. No orthopnea PND and lower extremity edema. She denies dizziness, headache, syncope/ near syncope. No palpitations.  BP in clinic today is 154/80. Heart rate 62 bpm.   Current Meds  Medication Sig  . acyclovir (ZOVIRAX) 400 MG tablet 400mg  tid x5 days for outbreak  . albuterol (PROAIR HFA) 108 (90 BASE)  MCG/ACT inhaler Inhale 2 puffs into the lungs every 6 (six) hours as needed for wheezing.  Marland Kitchen albuterol (PROVENTIL) (2.5 MG/3ML) 0.083% nebulizer solution Take 3 mLs (2.5 mg total) by nebulization every 6 (six) hours as needed.  Marland Kitchen amLODipine (NORVASC) 10 MG tablet TAKE 1 TABLET BY MOUTH EVERY DAY  . Ascorbic Acid (VITAMIN C) 500 MG tablet Take 500 mg by mouth daily.    Marland Kitchen aspirin 81 MG tablet Take 81 mg by mouth daily.    . budesonide-formoterol (SYMBICORT) 160-4.5 MCG/ACT inhaler Inhale 2 puffs into the lungs 2 (two) times daily.  . Calcium Carbonate (CALCIUM 600) 1500 MG TABS Take 1 tablet by mouth daily.    . Cholecalciferol (VITAMIN D3) 5000 UNITS CAPS Take 1 capsule by mouth daily.    . Cinnamon 500 MG TABS Take by mouth. With Chromium  . clobetasol cream (TEMOVATE) [id number] % Apply 1 application topically at bedtime as needed.  . Coenzyme Q10 (COQ10) 100 MG CAPS Take 1 capsule by mouth daily.    Marland Kitchen desmopressin (DDAVP) 0.2 MG tablet Take 3 tablets by mouth daily at bedtime   . furosemide (LASIX) 40 MG tablet Take 40 mg by mouth daily.   . hydrALAZINE (APRESOLINE) 25 MG tablet Take 1 tablet (25 mg total) by mouth 3 (three) times daily.  . hydrOXYzine (ATARAX/VISTARIL) 10 MG tablet Take 10 mg by mouth daily.  Marland Kitchen losartan (COZAAR) 100 MG tablet TAKE 1 TABLET (100 MG TOTAL) BY MOUTH DAILY.  . magnesium oxide (MAG-OX) 400 MG tablet Take 400  mg by mouth daily.    . Multiple Vitamin (MULTIVITAMIN) capsule Take 1 capsule by mouth daily.    Marland Kitchen omeprazole (PRILOSEC) 40 MG capsule TAKE 1 CAPSULE (40 MG TOTAL) BY MOUTH DAILY.  Marland Kitchen OVER THE COUNTER MEDICATION Bone growth factor  . OVER THE COUNTER MEDICATION 3 oz daily. Nopolea  . OVER THE COUNTER MEDICATION Ocuvite 50 +  . OVER THE COUNTER MEDICATION Juice plus  . pentosan polysulfate (ELMIRON) 100 MG capsule Take 100 mg by mouth 3 (three) times daily before meals.    . pravastatin (PRAVACHOL) 20 MG tablet Take 1 tablet (20 mg total) by mouth every evening.    . prazosin (MINIPRESS) 5 MG capsule TAKE 1 CAPSULE (5 MG TOTAL) BY MOUTH 2 (TWO) TIMES DAILY.  Marland Kitchen PRESCRIPTION MEDICATION Allergy shots every week  . Probiotic Product (PROBIOTIC PO) 1 daily   Allergies  Allergen Reactions  . Cephalexin     REACTION: high fever  . Penicillins     REACTION: severe swelling and hives  . Phenobarbital     REACTION: hives  . Tape     rash   Past Medical History:  Diagnosis Date  . Abnormal EKG    Normal LV function in the past  . Allergic rhinitis   . Asthma   . Bronchitis, mucopurulent recurrent ([place])   . Carotid artery disease ([place])    Doppler, April, 2009, 0-39% bilateral  . Cervical dysplasia 1971  . Chest pain, unspecified   . Diverticulosis of colon   . DJD (degenerative joint disease)   . Ejection fraction    EF 60%, echo, 2009  . GERD (gastroesophageal reflux disease)   . Headache(784.0)   . Hypercholesterolemia   . Hypertension   . Interstitial cystitis    sees urologist  . Lichen sclerosus   . Malignant melanoma ([place])    sees Dr. Nevada Crane in dermatology  . Migraines   . Mitral valve disease    Question mitral valve prolapse in the past, no prolapse by echo 2009  . Murmur 10/20/2015  . Osteoporosis    on fosomax > 5 years, stopped 11/2015  . Renal calculus    sees urologist  . Thyroid cyst    1 x 1.1 thyroid cyst noted on carotid Doppler, January, 2012  . Venous insufficiency    Family History  Problem Relation Age of Onset  . Cancer Father     Pancreatic  . Diabetes Mother   . Heart disease Mother   . Cancer Brother     Bile duct  . Diabetes Brother   . Hypertension Brother   . Diabetes Brother   . Heart disease Brother   . Hypertension Brother   . Hypertension Sister   . Hypertension Sister   . Colon cancer Paternal Uncle    Past Surgical History:  Procedure Laterality Date  . BREAST SURGERY  2013   Breast Bx-Benign  . CARDIAC CATHETERIZATION    . CATARACT EXTRACTION, BILATERAL    . cystoscopy and basket  stone removal right ureter  02/2006   Dr. Terance Hart  . GYNECOLOGIC CRYOSURGERY  1971  . left total hip replacement  2004   Dr. Gladstone Lighter  . melanoma removed from medial rleft knee area  2006   Dr. Nevada Crane  . NASAL SEPTUM SURGERY     Social History   Social History  . Marital status: Married    Spouse name: Edison Nasuti  . Number of children: 2  . Years of education: N/A  Occupational History  . retired Retired   Social History Main Topics  . Smoking status: Never Smoker  . Smokeless tobacco: Never Used  . Alcohol use 0.6 oz/week    1 Standard drinks or equivalent per week     Comment: social use  . Drug use: No  . Sexual activity: No     Comment: 1st intercourse 42 yo-1 partner   Other Topics Concern  . Not on file   Social History Narrative   Updated 11/2015   Work or School: none      Home Situation: lives with husband      Spiritual Beliefs: christian      Lifestyle: regular exercise; healthy diet           Review of Systems: General: negative for chills, fever, night sweats or weight changes.  Cardiovascular: negative for chest pain, dyspnea on exertion, edema, orthopnea, palpitations, paroxysmal nocturnal dyspnea or shortness of breath Dermatological: negative for rash Respiratory: negative for cough or wheezing Urologic: negative for hematuria Abdominal: negative for nausea, vomiting, diarrhea, bright red blood per rectum, melena, or hematemesis Neurologic: negative for visual changes, syncope, or dizziness All other systems reviewed and are otherwise negative except as noted above.   Physical Exam:  Blood pressure (!) 154/80, pulse 64, height 5\' 2"  (1.575 m), weight 187 lb 8 oz (85 kg).  General appearance: alert, cooperative and no distress Neck: no carotid bruit and no JVD Lungs: clear to auscultation bilaterally Heart: regular rate and rhythm and 1/6 SM at RUSB Extremities: extremities normal, atraumatic, no cyanosis or edema Pulses: 2+ and symmetric Skin:  Skin color, texture, turgor normal. No rashes or lesions Neurologic: Grossly normal  EKG not preformed   ASSESSMENT AND PLAN:   1. HTN: BP improved since starting clonidine, however she is having difficulties tolerating due to dry month. Her BP is also still moderatly elevated. She is on the max dose of amlodipine and losartan. Higher doses of lasix has been avoided to due mild renal insufficiency. Her HR limits use of BB. No dietary factors identified. I've discussed patient case and meds with pharmacist. We have elected to discontinue her clonidine due to intolerance. She is already on the lowest dose. We will replace with hydralazine, 25 mg TID. Continue all other meds as prescribed. F/u in 2-3 weeks for repeat BP check.  PLAN  F/u in HTN clinic with pharmacy in 2-3 weeks for repeat BP check.   Lyda Jester PA-C 09/02/2016 1:38 PM

## 2016-09-03 DIAGNOSIS — H26492 Other secondary cataract, left eye: Secondary | ICD-10-CM | POA: Diagnosis not present

## 2016-09-04 ENCOUNTER — Other Ambulatory Visit (INDEPENDENT_AMBULATORY_CARE_PROVIDER_SITE_OTHER): Payer: PPO

## 2016-09-04 DIAGNOSIS — J301 Allergic rhinitis due to pollen: Secondary | ICD-10-CM | POA: Diagnosis not present

## 2016-09-04 DIAGNOSIS — R899 Unspecified abnormal finding in specimens from other organs, systems and tissues: Secondary | ICD-10-CM

## 2016-09-04 DIAGNOSIS — J3089 Other allergic rhinitis: Secondary | ICD-10-CM | POA: Diagnosis not present

## 2016-09-04 LAB — BASIC METABOLIC PANEL
BUN: 19 mg/dL (ref 6–23)
CALCIUM: 9.4 mg/dL (ref 8.4–10.5)
CO2: 26 mEq/L (ref 19–32)
CREATININE: 0.98 mg/dL (ref 0.40–1.20)
Chloride: 107 mEq/L (ref 96–112)
GFR: 58.52 mL/min — AB (ref >60.00)
Glucose, Bld: 86 mg/dL (ref 70–99)
Potassium: 3.9 mEq/L (ref 3.5–5.1)
Sodium: 140 mEq/L (ref 135–145)

## 2016-09-05 DIAGNOSIS — J301 Allergic rhinitis due to pollen: Secondary | ICD-10-CM | POA: Diagnosis not present

## 2016-09-05 DIAGNOSIS — R3914 Feeling of incomplete bladder emptying: Secondary | ICD-10-CM | POA: Diagnosis not present

## 2016-09-05 DIAGNOSIS — J3089 Other allergic rhinitis: Secondary | ICD-10-CM | POA: Diagnosis not present

## 2016-09-11 DIAGNOSIS — J3089 Other allergic rhinitis: Secondary | ICD-10-CM | POA: Diagnosis not present

## 2016-09-11 DIAGNOSIS — J301 Allergic rhinitis due to pollen: Secondary | ICD-10-CM | POA: Diagnosis not present

## 2016-09-18 NOTE — Progress Notes (Signed)
Patient ID: Brianna Mccarty                 DOB: Oct 17, 1941                      MRN: [id number]     HPI: TERRENCE ABDULKARIM is a 75 y.o. female patient of Dr. Meda Coffee referred by Lyda Jester, PA to HTN clinic. PMH of HTN, CAD and HLD. Pt presented to the ER on 10/24 for hypertension and was started on clonidine. She followed up with Lyda Jester in clinic. At that time, clonidine was discontinued and patient was started on hydralazine 25mg  TID instead. Pt presents today for HTN follow up.  Pt reports tolerating hydralazine well. Denies CP, SOB, dizziness, faintness and blurred vision. She reports adherence and frequently checks her blood pressure multiple times throughout the day which causes anxiety. Discussed her use of prazosin since not first-line therapy for HTN management. She states that prazosin was the first medication she was prescribed for HTN at 75 yo and has been on it ever since. She reports stress/anxiety recently due to 4 deaths in the family and a situation with her daughter.   Current HTN meds: amlodipine 10 mg daily, Lasix 40 mg daily, losartan 100 mg, hydralazine 25 mg TID, prazosin 5 mg BID Previously tried: clonidine 0.1 mg BID (initated by ED for hypertensive urgency and d/ced in clinic the following week) BP goal: <140/90 mmHg  Family History: MI (mother, decreased 4 yo), MI (brother, decreased 21 yo), MI (sister, decreased 50 yo)  Social History: Never smoker, drinks 1 alcoholic beverage per week, denies illicit drug use  Diet: Reduced intake of breads and desserts, limits salt intake, drinks one cup of caffeinated coffee per day and switches to decaffeinated coffee/teas for the rest of the day. Drinks water throughout the day. Lost 20 lbs over the last 18 months.    Exercise: Walks 2 miles on the treadmill 3-4 days per week.  Home BP readings: fluctuates between 140-190s/80-90s mmHg. BP tends to trend up in the evening and at night  Wt Readings from Last 3  Encounters:  09/02/16 187 lb 8 oz (85 kg)  08/27/16 185 lb 8 oz (84.1 kg)  08/09/16 191 lb (86.6 kg)   BP Readings from Last 3 Encounters:  09/02/16 (!) 154/80  08/27/16 118/70  08/09/16 130/84   Pulse Readings from Last 3 Encounters:  09/02/16 64  08/27/16 90  08/08/16 71    Renal function: CrCl cannot be calculated (Unknown ideal weight.).  Past Medical History:  Diagnosis Date  . Abnormal EKG    Normal LV function in the past  . Allergic rhinitis   . Asthma   . Bronchitis, mucopurulent recurrent ([place])   . Carotid artery disease ([place])    Doppler, April, 2009, 0-39% bilateral  . Cervical dysplasia 1971  . Chest pain, unspecified   . Diverticulosis of colon   . DJD (degenerative joint disease)   . Ejection fraction    EF 60%, echo, 2009  . GERD (gastroesophageal reflux disease)   . Headache(784.0)   . Hypercholesterolemia   . Hypertension   . Interstitial cystitis    sees urologist  . Lichen sclerosus   . Malignant melanoma ([place])    sees Dr. [place] Crane in dermatology  . Migraines   . Mitral valve disease    Question mitral valve prolapse in the past, no prolapse by echo 2009  . Murmur 10/20/2015  . Osteoporosis  on fosomax > 5 years, stopped 11/2015  . Renal calculus    sees urologist  . Thyroid cyst    1 x 1.1 thyroid cyst noted on carotid Doppler, January, 2012  . Venous insufficiency     Current Outpatient Prescriptions on File Prior to Visit  Medication Sig Dispense Refill  . acyclovir (ZOVIRAX) 400 MG tablet 400mg  tid x5 days for outbreak 15 tablet 3  . albuterol (PROAIR HFA) 108 (90 BASE) MCG/ACT inhaler Inhale 2 puffs into the lungs every 6 (six) hours as needed for wheezing. 3 Inhaler 0  . albuterol (PROVENTIL) (2.5 MG/3ML) 0.083% nebulizer solution Take 3 mLs (2.5 mg total) by nebulization every 6 (six) hours as needed. 360 mL 0  . amLODipine (NORVASC) 10 MG tablet TAKE 1 TABLET BY MOUTH EVERY DAY 90 tablet 0  . Ascorbic Acid (VITAMIN C) 500 MG tablet  Take 500 mg by mouth daily.      Marland Kitchen aspirin 81 MG tablet Take 81 mg by mouth daily.      . budesonide-formoterol (SYMBICORT) 160-4.5 MCG/ACT inhaler Inhale 2 puffs into the lungs 2 (two) times daily. 3 Inhaler 3  . Calcium Carbonate (CALCIUM 600) 1500 MG TABS Take 1 tablet by mouth daily.      . Cholecalciferol (VITAMIN D3) 5000 UNITS CAPS Take 1 capsule by mouth daily.      . Cinnamon 500 MG TABS Take by mouth. With Chromium    . clobetasol cream (TEMOVATE) AB-123456789 % Apply 1 application topically at bedtime as needed. 30 g 1  . Coenzyme Q10 (COQ10) 100 MG CAPS Take 1 capsule by mouth daily.      Marland Kitchen desmopressin (DDAVP) 0.2 MG tablet Take 3 tablets by mouth daily at bedtime     . furosemide (LASIX) 40 MG tablet Take 40 mg by mouth daily.     . hydrALAZINE (APRESOLINE) 25 MG tablet Take 1 tablet (25 mg total) by mouth 3 (three) times daily. 270 tablet 3  . hydrOXYzine (ATARAX/VISTARIL) 10 MG tablet Take 10 mg by mouth daily.    Marland Kitchen losartan (COZAAR) 100 MG tablet TAKE 1 TABLET (100 MG TOTAL) BY MOUTH DAILY. 90 tablet 1  . magnesium oxide (MAG-OX) 400 MG tablet Take 400 mg by mouth daily.      . Multiple Vitamin (MULTIVITAMIN) capsule Take 1 capsule by mouth daily.      Marland Kitchen omeprazole (PRILOSEC) 40 MG capsule TAKE 1 CAPSULE (40 MG TOTAL) BY MOUTH DAILY. 90 capsule 1  . OVER THE COUNTER MEDICATION Bone growth factor    . OVER THE COUNTER MEDICATION 3 oz daily. Nopolea    . OVER THE COUNTER MEDICATION Ocuvite 50 +    . OVER THE COUNTER MEDICATION Juice plus    . pentosan polysulfate (ELMIRON) 100 MG capsule Take 100 mg by mouth 3 (three) times daily before meals.      . pravastatin (PRAVACHOL) 20 MG tablet Take 1 tablet (20 mg total) by mouth every evening. 90 tablet 3  . prazosin (MINIPRESS) 5 MG capsule TAKE 1 CAPSULE (5 MG TOTAL) BY MOUTH 2 (TWO) TIMES DAILY. 180 capsule 1  . PRESCRIPTION MEDICATION Allergy shots every week    . Probiotic Product (PROBIOTIC PO) 1 daily     No current  facility-administered medications on file prior to visit.     Allergies  Allergen Reactions  . Cephalexin     REACTION: high fever  . Penicillins     REACTION: severe swelling and hives  . Phenobarbital  REACTION: hives  . Tape     rash     Assessment/Plan:   1. Hypertension - Pt's blood pressure reading in clinic was 160/80 mmHg which is above the goal of <140/90 mmHg. Will increase hydralazine to 50 mg TID and continue other BP medications. Congratulated her on her lifestyle improvements and weight loss. Advised her that frequent blood pressure checks are not necessary and may causes undue anxiety impacting blood pressure reading. Advised consistent blood pressure checks once in the morning two hours after taking her medication. Patient to follow-up in clinic in 2 weeks for a blood pressure check.   Patient seen by Leroy Libman, P4 pharmacy student.   Verlie Liotta E. Lang Zingg, PharmD, [place] [id number] [address], [place], [place] [address] Phone: [phone]; Fax: [phone] 09/19/2016 1:08 PM

## 2016-09-19 ENCOUNTER — Ambulatory Visit (INDEPENDENT_AMBULATORY_CARE_PROVIDER_SITE_OTHER): Payer: PPO | Admitting: Pharmacist

## 2016-09-19 VITALS — BP 160/80 | HR 65

## 2016-09-19 DIAGNOSIS — J301 Allergic rhinitis due to pollen: Secondary | ICD-10-CM | POA: Diagnosis not present

## 2016-09-19 DIAGNOSIS — J3089 Other allergic rhinitis: Secondary | ICD-10-CM | POA: Diagnosis not present

## 2016-09-19 DIAGNOSIS — I10 Essential (primary) hypertension: Secondary | ICD-10-CM | POA: Diagnosis not present

## 2016-09-19 NOTE — Patient Instructions (Signed)
Start taking 50 mg of hydralazine three times daily. If you have 25 mg tablets remaining, take two tablets (50 mg total) until all tablets are finished.   Monitor blood pressure at home in the morning 2 hours after taking medication.  Follow-up in clinic in 2 weeks for a blood pressure check.

## 2016-10-02 NOTE — Progress Notes (Signed)
Patient ID: Brianna Mccarty                 DOB: [birth year]                      MRN: [id number]     HPI: Brianna Mccarty is a 75 y.o. female patient of Dr. Meda Coffee returning to HTN clinic for follow-up. PMH of HTN, CAD and HLD. At last pharmacy visit on 11/16, pt reported fluctuations in her BP, generally ranging in the 140-190s/80-90s mmHg with BP trending up in the evening. She endorses medication adherence and frequent home blood pressure checks throughout the day which causes her anxiety. Her blood pressure reading in clinic was above the goal <130/80 mmHg reading at 160/80 mmHg  Her hydralazine was increased to 50 mg TID and her other antihypertensive medications were continued. Pt has made lifestyle improvements and has lost 20 lbs recently.   Pt reports tolerating increase in hydralazine dose. She denies sx of CP, SOB, blurred vision, HA, dizziness, and faintness. She does endorses some fatigue. She brought her home blood pressure cuff to clinic for comparison and measureed her blood pressure at 126/68 mmHg, HR 63. Subsequently, her blood pressure was measured using the clinic BP cuff which read similarly at 122/66 mmHg.   Current HTN meds: amlodipine 10 mg daily, Lasix 40 mg daily, losartan 100 mg, hydralazine 50 mg TID, prazosin 5 mg BID Previously tried: clonidine 0.1 mg BID (initated by ED for hypertensive urgency and d/ced in clinic the following week) BP goal: <130/80 (new HTN guidelines)  Family History: MI (mother, decreased 89 yo), MI (brother, decreased 42 yo), MI (sister, decreased 28 yo)  Social History: Never smoker, drinks 1 alcoholic beverage per week, denies illicit drug use  Diet: Reduced intake of breads and desserts, limits salt intake, drinks one cup of caffeinated coffee per day and switches to decaffeinated coffee/teas for the rest of the day. Drinks water throughout the day. Lost 20 lbs over the last 18 months.    Exercise: Walks 2 miles on the treadmill 3-4 days per  week.  Home BP readings: 120-130/60-70s mmHg  Wt Readings from Last 3 Encounters:  09/02/16 187 lb 8 oz (85 kg)  08/27/16 185 lb 8 oz (84.1 kg)  08/09/16 191 lb (86.6 kg)   BP Readings from Last 3 Encounters:  09/19/16 (!) 160/80  09/02/16 (!) 154/80  08/27/16 118/70   Pulse Readings from Last 3 Encounters:  09/19/16 65  09/02/16 64  08/27/16 90    Renal function: CrCl cannot be calculated (Patient's most recent lab result is older than the maximum 21 days allowed.).  Past Medical History:  Diagnosis Date  . Abnormal EKG    Normal LV function in the past  . Allergic rhinitis   . Asthma   . Bronchitis, mucopurulent recurrent ([place])   . Carotid artery disease ([place])    Doppler, April, 2009, 0-39% bilateral  . Cervical dysplasia 1971  . Chest pain, unspecified   . Diverticulosis of colon   . DJD (degenerative joint disease)   . Ejection fraction    EF 60%, echo, 2009  . GERD (gastroesophageal reflux disease)   . Headache(784.0)   . Hypercholesterolemia   . Hypertension   . Interstitial cystitis    sees urologist  . Lichen sclerosus   . Malignant melanoma ([place])    sees Dr. Nevada Crane in dermatology  . Migraines   . Mitral valve disease  Question mitral valve prolapse in the past, no prolapse by echo 2009  . Murmur 10/20/2015  . Osteoporosis    on fosomax > 5 years, stopped 11/2015  . Renal calculus    sees urologist  . Thyroid cyst    1 x 1.1 thyroid cyst noted on carotid Doppler, January, 2012  . Venous insufficiency     Current Outpatient Prescriptions on File Prior to Visit  Medication Sig Dispense Refill  . acyclovir (ZOVIRAX) 400 MG tablet 400mg  tid x5 days for outbreak 15 tablet 3  . albuterol (PROAIR HFA) 108 (90 BASE) MCG/ACT inhaler Inhale 2 puffs into the lungs every 6 (six) hours as needed for wheezing. 3 Inhaler 0  . albuterol (PROVENTIL) (2.5 MG/3ML) 0.083% nebulizer solution Take 3 mLs (2.5 mg total) by nebulization every 6 (six) hours as needed. 360  mL 0  . amLODipine (NORVASC) 10 MG tablet TAKE 1 TABLET BY MOUTH EVERY DAY 90 tablet 0  . Ascorbic Acid (VITAMIN C) 500 MG tablet Take 500 mg by mouth daily.      Marland Kitchen aspirin 81 MG tablet Take 81 mg by mouth daily.      . budesonide-formoterol (SYMBICORT) 160-4.5 MCG/ACT inhaler Inhale 2 puffs into the lungs 2 (two) times daily. 3 Inhaler 3  . Calcium Carbonate (CALCIUM 600) 1500 MG TABS Take 1 tablet by mouth daily.      . Cholecalciferol (VITAMIN D3) 5000 UNITS CAPS Take 1 capsule by mouth daily.      . Cinnamon 500 MG TABS Take by mouth. With Chromium    . clobetasol cream (TEMOVATE) AB-123456789 % Apply 1 application topically at bedtime as needed. 30 g 1  . Coenzyme Q10 (COQ10) 100 MG CAPS Take 1 capsule by mouth daily.      Marland Kitchen desmopressin (DDAVP) 0.2 MG tablet Take 3 tablets by mouth daily at bedtime     . furosemide (LASIX) 40 MG tablet Take 40 mg by mouth daily.     . hydrALAZINE (APRESOLINE) 50 MG tablet Take 50 mg by mouth 3 (three) times daily.    . hydrOXYzine (ATARAX/VISTARIL) 10 MG tablet Take 10 mg by mouth daily.    Marland Kitchen losartan (COZAAR) 100 MG tablet TAKE 1 TABLET (100 MG TOTAL) BY MOUTH DAILY. 90 tablet 1  . magnesium oxide (MAG-OX) 400 MG tablet Take 400 mg by mouth daily.      . Multiple Vitamin (MULTIVITAMIN) capsule Take 1 capsule by mouth daily.      Marland Kitchen omeprazole (PRILOSEC) 40 MG capsule TAKE 1 CAPSULE (40 MG TOTAL) BY MOUTH DAILY. 90 capsule 1  . OVER THE COUNTER MEDICATION Bone growth factor    . OVER THE COUNTER MEDICATION 3 oz daily. Nopolea    . OVER THE COUNTER MEDICATION Ocuvite 50 +    . OVER THE COUNTER MEDICATION Juice plus    . pentosan polysulfate (ELMIRON) 100 MG capsule Take 100 mg by mouth 3 (three) times daily before meals.      . pravastatin (PRAVACHOL) 20 MG tablet Take 1 tablet (20 mg total) by mouth every evening. 90 tablet 3  . prazosin (MINIPRESS) 5 MG capsule TAKE 1 CAPSULE (5 MG TOTAL) BY MOUTH 2 (TWO) TIMES DAILY. 180 capsule 1  . PRESCRIPTION MEDICATION  Allergy shots every week    . Probiotic Product (PROBIOTIC PO) 1 daily     No current facility-administered medications on file prior to visit.     Allergies  Allergen Reactions  . Cephalexin  REACTION: high fever  . Penicillins     REACTION: severe swelling and hives  . Phenobarbital     REACTION: hives  . Tape     rash     Assessment/Plan:  1. Hypertension - Patient's blood pressure of 122/66 mmHg is at goal of <130/80 mmHg. Her home blood pressure readings align with blood pressure readings obtained in clinic. Will continue on current antihypertensive regimen of amlodipine 10 mg daily, Lasix 40 mg daily, losartan 100 mg, hydralazine 50 mg TID, and prazosin 5 mg BID. Advised pt to continue home blood pressure monitoring. No follow-up necessary unless pt has concerns or questions.   Patient seen by Leroy Libman, P4 pharmacy student.   Aadan Chenier E. Supple, PharmD, [place] [id number] [address], [place], [place] [address] Phone: [phone]; Fax: [phone] 10/03/2016 3:18 PM

## 2016-10-03 ENCOUNTER — Ambulatory Visit (INDEPENDENT_AMBULATORY_CARE_PROVIDER_SITE_OTHER): Payer: PPO | Admitting: Pharmacist

## 2016-10-03 ENCOUNTER — Encounter (INDEPENDENT_AMBULATORY_CARE_PROVIDER_SITE_OTHER): Payer: Self-pay

## 2016-10-03 VITALS — BP 122/66 | HR 60

## 2016-10-03 DIAGNOSIS — J3089 Other allergic rhinitis: Secondary | ICD-10-CM | POA: Diagnosis not present

## 2016-10-03 DIAGNOSIS — J301 Allergic rhinitis due to pollen: Secondary | ICD-10-CM | POA: Diagnosis not present

## 2016-10-03 DIAGNOSIS — I10 Essential (primary) hypertension: Secondary | ICD-10-CM | POA: Diagnosis not present

## 2016-10-03 NOTE — Patient Instructions (Addendum)
Continue taking hydralazine, amlodipine (Norvasc), furosemide (Lasix), losartan (Cozaar), prazosin (Minipress) for your blood pressure.  Call the clinic if you have any concerns or questions [phone].

## 2016-10-07 DIAGNOSIS — J3089 Other allergic rhinitis: Secondary | ICD-10-CM | POA: Diagnosis not present

## 2016-10-07 DIAGNOSIS — J301 Allergic rhinitis due to pollen: Secondary | ICD-10-CM | POA: Diagnosis not present

## 2016-10-10 DIAGNOSIS — J301 Allergic rhinitis due to pollen: Secondary | ICD-10-CM | POA: Diagnosis not present

## 2016-10-10 DIAGNOSIS — J3089 Other allergic rhinitis: Secondary | ICD-10-CM | POA: Diagnosis not present

## 2016-10-11 DIAGNOSIS — L905 Scar conditions and fibrosis of skin: Secondary | ICD-10-CM | POA: Diagnosis not present

## 2016-10-11 DIAGNOSIS — Z08 Encounter for follow-up examination after completed treatment for malignant neoplasm: Secondary | ICD-10-CM | POA: Diagnosis not present

## 2016-10-11 DIAGNOSIS — L918 Other hypertrophic disorders of the skin: Secondary | ICD-10-CM | POA: Diagnosis not present

## 2016-10-11 DIAGNOSIS — Z85820 Personal history of malignant melanoma of skin: Secondary | ICD-10-CM | POA: Diagnosis not present

## 2016-10-11 DIAGNOSIS — Z1283 Encounter for screening for malignant neoplasm of skin: Secondary | ICD-10-CM | POA: Diagnosis not present

## 2016-10-14 DIAGNOSIS — J301 Allergic rhinitis due to pollen: Secondary | ICD-10-CM | POA: Diagnosis not present

## 2016-10-14 DIAGNOSIS — J3089 Other allergic rhinitis: Secondary | ICD-10-CM | POA: Diagnosis not present

## 2016-10-17 DIAGNOSIS — J3089 Other allergic rhinitis: Secondary | ICD-10-CM | POA: Diagnosis not present

## 2016-10-17 DIAGNOSIS — J301 Allergic rhinitis due to pollen: Secondary | ICD-10-CM | POA: Diagnosis not present

## 2016-10-17 DIAGNOSIS — J4540 Moderate persistent asthma, uncomplicated: Secondary | ICD-10-CM | POA: Diagnosis not present

## 2016-10-17 DIAGNOSIS — H1045 Other chronic allergic conjunctivitis: Secondary | ICD-10-CM | POA: Diagnosis not present

## 2016-10-22 ENCOUNTER — Telehealth: Payer: Self-pay | Admitting: *Deleted

## 2016-10-22 MED ORDER — HYDRALAZINE HCL 50 MG PO TABS: 50.0000 mg | ORAL_TABLET | Freq: Three times a day (TID) | ORAL | 11 refills | Status: DC

## 2016-10-22 NOTE — Telephone Encounter (Signed)
Sent in new rx for hydralazine 50mg  TID and [facility] reported no issue filling it, copay is $4. Advised pt and she had no further questions.

## 2016-11-08 ENCOUNTER — Encounter: Payer: Self-pay | Admitting: Family Medicine

## 2016-11-08 ENCOUNTER — Ambulatory Visit (INDEPENDENT_AMBULATORY_CARE_PROVIDER_SITE_OTHER): Payer: PPO | Admitting: Family Medicine

## 2016-11-08 VITALS — BP 118/64 | HR 69 | Temp 97.6°F | Ht 62.0 in | Wt 189.9 lb

## 2016-11-08 DIAGNOSIS — E7800 Pure hypercholesterolemia, unspecified: Secondary | ICD-10-CM

## 2016-11-08 DIAGNOSIS — J3089 Other allergic rhinitis: Secondary | ICD-10-CM | POA: Diagnosis not present

## 2016-11-08 DIAGNOSIS — C439 Malignant melanoma of skin, unspecified: Secondary | ICD-10-CM | POA: Diagnosis not present

## 2016-11-08 DIAGNOSIS — M810 Age-related osteoporosis without current pathological fracture: Secondary | ICD-10-CM

## 2016-11-08 DIAGNOSIS — I779 Disorder of arteries and arterioles, unspecified: Secondary | ICD-10-CM | POA: Diagnosis not present

## 2016-11-08 DIAGNOSIS — I10 Essential (primary) hypertension: Secondary | ICD-10-CM | POA: Diagnosis not present

## 2016-11-08 DIAGNOSIS — J301 Allergic rhinitis due to pollen: Secondary | ICD-10-CM | POA: Diagnosis not present

## 2016-11-08 DIAGNOSIS — I739 Peripheral vascular disease, unspecified: Secondary | ICD-10-CM

## 2016-11-08 NOTE — Progress Notes (Signed)
Pre visit review using our clinic review tool, if applicable. No additional management support is needed unless otherwise documented below in the visit note. 

## 2016-11-08 NOTE — Progress Notes (Signed)
HPI:  Brianna Mccarty is a pleasant 76 yo with a PMH significant for resistant HTN, HLD, carotid art dz, venous insufficiency, asthma, allergies, GERD, thyroid cyst, osteoporosis, melanoma and IC here for follow up. She sees several specialists including a urologist, gynecologist, cardiologist and a dermatologist. She saw her cardiologist recently for blood pressure follow up. She had her yearly women's exam with her gynecologist. Reports is doing great with much better control of BP. No CP, SOB, HA, DOE. Getting regular exercise. On fosamax with prior PCP for osteoporosis, now on drug free holiday per her preference since 11/2015. Looks like due for repeat dexa. She would like to repeat dexa.  ROS: See pertinent positives and negatives per HPI.  Past Medical History:  Diagnosis Date  . Abnormal EKG    Normal LV function in the past  . Allergic rhinitis   . Asthma   . Bronchitis, mucopurulent recurrent ([place])   . Carotid artery disease ([place])    Doppler, April, 2009, 0-39% bilateral  . Cervical dysplasia 1971  . Chest pain, unspecified   . Diverticulosis of colon   . DJD (degenerative joint disease)   . Ejection fraction    EF 60%, echo, 2009  . GERD (gastroesophageal reflux disease)   . Headache(784.0)   . Hypercholesterolemia   . Hypertension   . Interstitial cystitis    sees urologist  . Lichen sclerosus   . Malignant melanoma ([place])    sees Dr. Nevada Crane in dermatology  . Migraines   . Mitral valve disease    Question mitral valve prolapse in the past, no prolapse by echo 2009  . Murmur 10/20/2015  . Osteoporosis    on fosomax > 5 years, stopped 11/2015  . Renal calculus    sees urologist  . Thyroid cyst    1 x 1.1 thyroid cyst noted on carotid Doppler, January, 2012  . Venous insufficiency     Past Surgical History:  Procedure Laterality Date  . BREAST SURGERY  2013   Breast Bx-Benign  . CARDIAC CATHETERIZATION    . CATARACT EXTRACTION, BILATERAL    . cystoscopy and  basket stone removal right ureter  02/2006   Dr. Terance Hart  . GYNECOLOGIC CRYOSURGERY  1971  . left total hip replacement  2004   Dr. Gladstone Lighter  . melanoma removed from medial rleft knee area  2006   Dr. Nevada Crane  . NASAL SEPTUM SURGERY      Family History  Problem Relation Age of Onset  . Cancer Father     Pancreatic  . Diabetes Mother   . Heart disease Mother   . Cancer Brother     Bile duct  . Diabetes Brother   . Hypertension Brother   . Diabetes Brother   . Heart disease Brother   . Hypertension Brother   . Hypertension Sister   . Hypertension Sister   . Colon cancer Paternal Uncle     Social History   Social History  . Marital status: Married    Spouse name: Edison Nasuti  . Number of children: 2  . Years of education: N/A   Occupational History  . retired Retired   Social History Main Topics  . Smoking status: Never Smoker  . Smokeless tobacco: Never Used  . Alcohol use 0.6 oz/week    1 Standard drinks or equivalent per week     Comment: social use  . Drug use: No  . Sexual activity: No     Comment: 1st intercourse 19 yo-1  partner   Other Topics Concern  . None   Social History Narrative   Updated 11/2015   Work or School: none      Home Situation: lives with husband      Spiritual Beliefs: christian      Lifestyle: regular exercise; healthy diet           Current Outpatient Prescriptions:  .  acyclovir (ZOVIRAX) 400 MG tablet, 400mg  tid x5 days for outbreak, Disp: 15 tablet, Rfl: 3 .  albuterol (PROAIR HFA) 108 (90 BASE) MCG/ACT inhaler, Inhale 2 puffs into the lungs every 6 (six) hours as needed for wheezing., Disp: 3 Inhaler, Rfl: 0 .  albuterol (PROVENTIL) (2.5 MG/3ML) 0.083% nebulizer solution, Take 3 mLs (2.5 mg total) by nebulization every 6 (six) hours as needed., Disp: 360 mL, Rfl: 0 .  amLODipine (NORVASC) 10 MG tablet, TAKE 1 TABLET BY MOUTH EVERY DAY, Disp: 90 tablet, Rfl: 0 .  Ascorbic Acid (VITAMIN C) 500 MG tablet, Take 500 mg by mouth  daily.  , Disp: , Rfl:  .  aspirin 81 MG tablet, Take 81 mg by mouth daily.  , Disp: , Rfl:  .  budesonide-formoterol (SYMBICORT) 160-4.5 MCG/ACT inhaler, Inhale 2 puffs into the lungs 2 (two) times daily., Disp: 3 Inhaler, Rfl: 3 .  Calcium Carbonate (CALCIUM 600) 1500 MG TABS, Take 1 tablet by mouth daily.  , Disp: , Rfl:  .  Cholecalciferol (VITAMIN D3) 5000 UNITS CAPS, Take 1 capsule by mouth daily.  , Disp: , Rfl:  .  Cinnamon 500 MG TABS, Take by mouth. With Chromium, Disp: , Rfl:  .  clobetasol cream (TEMOVATE) AB-123456789 %, Apply 1 application topically at bedtime as needed., Disp: 30 g, Rfl: 1 .  Coenzyme Q10 (COQ10) 100 MG CAPS, Take 1 capsule by mouth daily.  , Disp: , Rfl:  .  desmopressin (DDAVP) 0.2 MG tablet, Take 3 tablets by mouth daily at bedtime , Disp: , Rfl:  .  furosemide (LASIX) 40 MG tablet, Take 40 mg by mouth daily. , Disp: , Rfl:  .  hydrALAZINE (APRESOLINE) 50 MG tablet, Take 1 tablet (50 mg total) by mouth 3 (three) times daily., Disp: 90 tablet, Rfl: 11 .  hydrOXYzine (ATARAX/VISTARIL) 10 MG tablet, Take 10 mg by mouth daily., Disp: , Rfl:  .  losartan (COZAAR) 100 MG tablet, TAKE 1 TABLET (100 MG TOTAL) BY MOUTH DAILY., Disp: 90 tablet, Rfl: 1 .  magnesium oxide (MAG-OX) 400 MG tablet, Take 400 mg by mouth daily.  , Disp: , Rfl:  .  Multiple Vitamin (MULTIVITAMIN) capsule, Take 1 capsule by mouth daily.  , Disp: , Rfl:  .  omeprazole (PRILOSEC) 40 MG capsule, TAKE 1 CAPSULE (40 MG TOTAL) BY MOUTH DAILY., Disp: 90 capsule, Rfl: 1 .  OVER THE COUNTER MEDICATION, Bone growth factor, Disp: , Rfl:  .  OVER THE COUNTER MEDICATION, 3 oz daily. Nopolea, Disp: , Rfl:  .  OVER THE COUNTER MEDICATION, Ocuvite 50 +, Disp: , Rfl:  .  OVER THE COUNTER MEDICATION, Juice plus, Disp: , Rfl:  .  pentosan polysulfate (ELMIRON) 100 MG capsule, Take 100 mg by mouth 3 (three) times daily before meals.  , Disp: , Rfl:  .  pravastatin (PRAVACHOL) 20 MG tablet, Take 1 tablet (20 mg total) by  mouth every evening., Disp: 90 tablet, Rfl: 3 .  prazosin (MINIPRESS) 5 MG capsule, TAKE 1 CAPSULE (5 MG TOTAL) BY MOUTH 2 (TWO) TIMES DAILY., Disp: 180 capsule, Rfl:  1 .  PRESCRIPTION MEDICATION, Allergy shots every other week, Disp: , Rfl:  .  Probiotic Product (PROBIOTIC PO), 1 daily, Disp: , Rfl:   EXAM:  Vitals:   11/08/16 1354  BP: 118/64  Pulse: 69  Temp: 97.6 F (36.4 C)    Body mass index is 34.73 kg/m.  GENERAL: vitals reviewed and listed above, alert, oriented, appears well hydrated and in no acute distress  HEENT: atraumatic, conjunttiva clear, no obvious abnormalities on inspection of external nose and ears  NECK: no obvious masses on inspection  LUNGS: clear to auscultation bilaterally, no wheezes, rales or rhonchi, good air movement  CV: HRRR, no peripheral edema  MS: moves all extremities without noticeable abnormality  PSYCH: pleasant and cooperative, no obvious depression or anxiety  ASSESSMENT AND PLAN:  Discussed the following assessment and plan:  Essential hypertension  Bilateral carotid artery disease (HCC)  HYPERCHOLESTEROLEMIA  Melanoma of skin (HCC)  Osteoporosis, unspecified osteoporosis type, unspecified pathological fracture presence - Plan: DG Bone Density  -cont current medications -lifestyle recs -dexa -Patient advised to return or notify a doctor immediately if symptoms worsen or persist or new concerns arise.  Patient Instructions  BEFORE YOU LEAVE: -follow up: 4 months  -We placed a referral for you as discussed for the bone density. It usually takes about 1-2 weeks to process and schedule this referral. If you have not heard from [place] regarding this appointment in 2 weeks please contact our office.   We recommend the following healthy lifestyle for LIFE: 1) Small portions.   Tip: eat off of a salad plate instead of a dinner plate.  Tip: It is ok to feel hungry after a meal of proper portion sizes  Tip: if you need more or  a snack choose fruits, veggies and/or a handful of nuts or seeds.  2) Eat a healthy clean diet.  * Tip: Avoid (less then 1 serving per week): processed foods, sweets, sweetened drinks, white starches (rice, flour, bread, potatoes, pasta, etc), red meat, fast foods, butter  *Tip: CHOOSE instead   * 5-9 servings per day of fresh or frozen fruits and vegetables (but not corn, potatoes, bananas, canned or dried fruit)   *nuts and seeds, beans   *olives and olive oil   *small portions of lean meats such as fish and white chicken    *small portions of whole grains  3)Get at least 150 minutes of sweaty aerobic exercise per week.  4)Reduce stress - consider counseling, meditation and relaxation to balance other aspects of your life.      Colin Benton R., DO

## 2016-11-08 NOTE — Patient Instructions (Signed)
BEFORE YOU LEAVE: -follow up: 4 months  -We placed a referral for you as discussed for the bone density. It usually takes about 1-2 weeks to process and schedule this referral. If you have not heard from [place] regarding this appointment in 2 weeks please contact our office.   We recommend the following healthy lifestyle for LIFE: 1) Small portions.   Tip: eat off of a salad plate instead of a dinner plate.  Tip: It is ok to feel hungry after a meal of proper portion sizes  Tip: if you need more or a snack choose fruits, veggies and/or a handful of nuts or seeds.  2) Eat a healthy clean diet.  * Tip: Avoid (less then 1 serving per week): processed foods, sweets, sweetened drinks, white starches (rice, flour, bread, potatoes, pasta, etc), red meat, fast foods, butter  *Tip: CHOOSE instead   * 5-9 servings per day of fresh or frozen fruits and vegetables (but not corn, potatoes, bananas, canned or dried fruit)   *nuts and seeds, beans   *olives and olive oil   *small portions of lean meats such as fish and white chicken    *small portions of whole grains  3)Get at least 150 minutes of sweaty aerobic exercise per week.  4)Reduce stress - consider counseling, meditation and relaxation to balance other aspects of your life.

## 2016-11-11 ENCOUNTER — Ambulatory Visit (INDEPENDENT_AMBULATORY_CARE_PROVIDER_SITE_OTHER): Payer: PPO | Admitting: Ophthalmology

## 2016-11-11 DIAGNOSIS — H353121 Nonexudative age-related macular degeneration, left eye, early dry stage: Secondary | ICD-10-CM

## 2016-11-11 DIAGNOSIS — H35033 Hypertensive retinopathy, bilateral: Secondary | ICD-10-CM

## 2016-11-11 DIAGNOSIS — H35372 Puckering of macula, left eye: Secondary | ICD-10-CM | POA: Diagnosis not present

## 2016-11-11 DIAGNOSIS — H43813 Vitreous degeneration, bilateral: Secondary | ICD-10-CM

## 2016-11-11 DIAGNOSIS — I10 Essential (primary) hypertension: Secondary | ICD-10-CM

## 2016-11-11 DIAGNOSIS — H35341 Macular cyst, hole, or pseudohole, right eye: Secondary | ICD-10-CM | POA: Diagnosis not present

## 2016-11-11 DIAGNOSIS — H33303 Unspecified retinal break, bilateral: Secondary | ICD-10-CM | POA: Diagnosis not present

## 2016-11-11 DIAGNOSIS — D3131 Benign neoplasm of right choroid: Secondary | ICD-10-CM

## 2016-11-11 DIAGNOSIS — D3132 Benign neoplasm of left choroid: Secondary | ICD-10-CM

## 2016-11-13 ENCOUNTER — Encounter (INDEPENDENT_AMBULATORY_CARE_PROVIDER_SITE_OTHER): Payer: Self-pay

## 2016-11-13 ENCOUNTER — Ambulatory Visit (INDEPENDENT_AMBULATORY_CARE_PROVIDER_SITE_OTHER): Payer: PPO | Admitting: Cardiology

## 2016-11-13 VITALS — BP 142/80 | HR 70 | Ht 62.0 in | Wt 190.0 lb

## 2016-11-13 DIAGNOSIS — I739 Peripheral vascular disease, unspecified: Secondary | ICD-10-CM

## 2016-11-13 DIAGNOSIS — R252 Cramp and spasm: Secondary | ICD-10-CM

## 2016-11-13 DIAGNOSIS — R011 Cardiac murmur, unspecified: Secondary | ICD-10-CM

## 2016-11-13 DIAGNOSIS — I10 Essential (primary) hypertension: Secondary | ICD-10-CM | POA: Diagnosis not present

## 2016-11-13 DIAGNOSIS — R072 Precordial pain: Secondary | ICD-10-CM | POA: Diagnosis not present

## 2016-11-13 DIAGNOSIS — I779 Disorder of arteries and arterioles, unspecified: Secondary | ICD-10-CM | POA: Diagnosis not present

## 2016-11-13 DIAGNOSIS — Z8249 Family history of ischemic heart disease and other diseases of the circulatory system: Secondary | ICD-10-CM

## 2016-11-13 DIAGNOSIS — E7800 Pure hypercholesterolemia, unspecified: Secondary | ICD-10-CM

## 2016-11-13 DIAGNOSIS — R0683 Snoring: Secondary | ICD-10-CM

## 2016-11-13 MED ORDER — HYDROCHLOROTHIAZIDE 25 MG PO TABS: 25.0000 mg | ORAL_TABLET | Freq: Every day | ORAL | 3 refills | Status: DC

## 2016-11-13 NOTE — Progress Notes (Signed)
Patient ID: Brianna Mccarty, female   DOB: June 08, 1941, 76 y.o.   MRN: [id number]      Cardiology Office Note   Date:  11/13/2016   ID:  Brianna Mccarty, DOB 11-23-1940, MRN [id number]  PCP:  Lucretia Kern., DO  Cardiologist:  Ena Dawley, MD   Chief complain: Follow up for HTN, HLP   History of Present Illness: Brianna Mccarty is a 76 y.o. female who presents for follow up after 4 years. Former patient of Dr Ron Parker. She has been doing well and remains active, she has been using a lot of supplements and asks advice about them. No chest pain, DOE, no palpitations, dizziness or syncope, no LE edema, orthopnea, PND. She stopped using simvastatin 2 months ago as she developed muscle cramps, those haven't resolved since then. Otherwise complaint with her meds.  11/13/2016 - patient is coming after one year, in the last year she has noticed worsening of her blood pressure and has been seen by our PAs and pharmacist on for occasion with uptitrating of her blood pressure medicine. She brings her blood pressure diary today with most of the blood pressure being controlled but several over 140. She states that she has been feeling profoundly tired, she links it to starting of hydralazine but it slowly getting better. She has had very stressful year, she previously had 4 family members dying of myocardial infarction in her brother died in 02-Sep-2016 at age 77. Also her daughter has been dealing with depression. She has never had a stress test. She has been developing lower extremity cramping that didn't change with change of statins. She continues to take furosemide and denies lower extremity edema. She is no palpitations or syncope.  Past Medical History:  Diagnosis Date  . Abnormal EKG    Normal LV function in the past  . Allergic rhinitis   . Asthma   . Bronchitis, mucopurulent recurrent ([place])   . Carotid artery disease ([place])    Doppler, April, 2009, 0-39% bilateral  . Cervical dysplasia 1971    . Chest pain, unspecified   . Diverticulosis of colon   . DJD (degenerative joint disease)   . Ejection fraction    EF 60%, echo, 2009  . GERD (gastroesophageal reflux disease)   . Headache(784.0)   . Hypercholesterolemia   . Hypertension   . Interstitial cystitis    sees urologist  . Lichen sclerosus   . Malignant melanoma ([place])    sees Dr. [place] Crane in dermatology  . Migraines   . Mitral valve disease    Question mitral valve prolapse in the past, no prolapse by echo 2009  . Murmur 10/20/2015  . Osteoporosis    on fosomax > 5 years, stopped 11/2015  . Renal calculus    sees urologist  . Thyroid cyst    1 x 1.1 thyroid cyst noted on carotid Doppler, January, 2012  . Venous insufficiency     Past Surgical History:  Procedure Laterality Date  . BREAST SURGERY  2013   Breast Bx-Benign  . CARDIAC CATHETERIZATION    . CATARACT EXTRACTION, BILATERAL    . cystoscopy and basket stone removal right ureter  02/2006   Dr. Terance Hart  . GYNECOLOGIC CRYOSURGERY  1971  . left total hip replacement  2004   Dr. Gladstone Lighter  . melanoma removed from medial rleft knee area  2006   Dr. [place] Crane  . NASAL SEPTUM SURGERY       Current Outpatient Prescriptions  Medication  Sig Dispense Refill  . acyclovir (ZOVIRAX) 400 MG tablet 400mg  tid x5 days for outbreak 15 tablet 3  . albuterol (PROAIR HFA) 108 (90 BASE) MCG/ACT inhaler Inhale 2 puffs into the lungs every 6 (six) hours as needed for wheezing. 3 Inhaler 0  . albuterol (PROVENTIL) (2.5 MG/3ML) 0.083% nebulizer solution Take 3 mLs (2.5 mg total) by nebulization every 6 (six) hours as needed. 360 mL 0  . amLODipine (NORVASC) 10 MG tablet TAKE 1 TABLET BY MOUTH EVERY DAY 90 tablet 0  . Ascorbic Acid (VITAMIN C) 500 MG tablet Take 500 mg by mouth daily.      Marland Kitchen aspirin 81 MG tablet Take 81 mg by mouth daily.      . budesonide-formoterol (SYMBICORT) 160-4.5 MCG/ACT inhaler Inhale 2 puffs into the lungs 2 (two) times daily. 3 Inhaler 3  . Calcium  Carbonate (CALCIUM 600) 1500 MG TABS Take 1 tablet by mouth daily.      . Cholecalciferol (VITAMIN D3) 5000 UNITS CAPS Take 1 capsule by mouth daily.      . Cinnamon 500 MG TABS Take by mouth. With Chromium    . clobetasol cream (TEMOVATE) [id number] % Apply 1 application topically at bedtime as needed. 30 g 1  . Coenzyme Q10 (COQ10) 100 MG CAPS Take 1 capsule by mouth daily.      Marland Kitchen desmopressin (DDAVP) 0.2 MG tablet Take 3 tablets by mouth daily at bedtime     . furosemide (LASIX) 40 MG tablet Take 40 mg by mouth daily.     . hydrALAZINE (APRESOLINE) 50 MG tablet Take 1 tablet (50 mg total) by mouth 3 (three) times daily. 90 tablet 11  . hydrOXYzine (ATARAX/VISTARIL) 10 MG tablet Take 10 mg by mouth daily.    Marland Kitchen losartan (COZAAR) 100 MG tablet TAKE 1 TABLET (100 MG TOTAL) BY MOUTH DAILY. 90 tablet 1  . magnesium oxide (MAG-OX) 400 MG tablet Take 400 mg by mouth daily.      . Multiple Vitamin (MULTIVITAMIN) capsule Take 1 capsule by mouth daily.      Marland Kitchen omeprazole (PRILOSEC) 40 MG capsule TAKE 1 CAPSULE (40 MG TOTAL) BY MOUTH DAILY. 90 capsule 1  . OVER THE COUNTER MEDICATION Bone growth factor    . OVER THE COUNTER MEDICATION 3 oz daily. Nopolea    . OVER THE COUNTER MEDICATION Ocuvite 50 +    . OVER THE COUNTER MEDICATION Juice plus    . pentosan polysulfate (ELMIRON) 100 MG capsule Take 100 mg by mouth 3 (three) times daily before meals.      . pravastatin (PRAVACHOL) 20 MG tablet Take 1 tablet (20 mg total) by mouth every evening. 90 tablet 3  . prazosin (MINIPRESS) 5 MG capsule TAKE 1 CAPSULE (5 MG TOTAL) BY MOUTH 2 (TWO) TIMES DAILY. 180 capsule 1  . PRESCRIPTION MEDICATION Allergy shots every other week    . Probiotic Product (PROBIOTIC PO) 1 daily     No current facility-administered medications for this visit.    Allergies:   Cephalexin; Penicillins; Phenobarbital; and Tape   Social History:  The patient  reports that she has never smoked. She has never used smokeless tobacco. She reports  that she drinks about 0.6 oz of alcohol per week . She reports that she does not use drugs.   Family History:  The patient's family history includes Cancer in her brother and father; Colon cancer in her paternal uncle; Diabetes in her brother, brother, and mother; Heart disease in her brother  and mother; Hypertension in her brother, brother, sister, and sister.   ROS:  Please see the history of present illness.  All other systems are reviewed and negative.   PHYSICAL EXAM: VS:  BP (!) 142/80   Pulse 70   Ht 5\' 2"  (1.575 m)   Wt 190 lb (86.2 kg)   BMI 34.75 kg/m  , BMI Body mass index is 34.75 kg/m. GEN: Well nourished, well developed, in no acute distress  HEENT: normal  Neck: no JVD, carotid bruits, or masses Cardiac: RRR; 3/6 murmurs, rubs, or gallops,no edema  Respiratory:  clear to auscultation bilaterally, normal work of breathing GI: soft, nontender, nondistended, + BS MS: no deformity or atrophy  Skin: warm and dry, no rash Neuro:  Strength and sensation are intact Psych: euthymic mood, full affect  EKG:  SRlow voltage ECG, LAFB, unchanged from prior ECG.  Recent Labs: 12/21/2015: ALT 14 08/08/2016: Hemoglobin 12.4; Platelets 247.0 09/04/2016: BUN 19; Creatinine, Ser 0.98; Potassium 3.9; Sodium 140  Lipid Panel    Component Value Date/Time   CHOL 221 (H) 10/20/2015 1120   TRIG 43 10/20/2015 1120   HDL 119 10/20/2015 1120   CHOLHDL 1.9 10/20/2015 1120   VLDL 9 10/20/2015 1120   LDLCALC 93 10/20/2015 1120   LDLDIRECT 126.6 07/18/2010 1045   Wt Readings from Last 3 Encounters:  11/13/16 190 lb (86.2 kg)  11/08/16 189 lb 14.4 oz (86.1 kg)  09/02/16 187 lb 8 oz (85 kg)    TTE: 07/04/2015 Left ventricle: The cavity size was normal. Systolic function was normal. The estimated ejection fraction was in the range of 60% to 65%. Wall motion was normal; there were no regional wall motion abnormalities. - Mitral valve: Moderately calcified annulus. Valve area  by continuity equation (using LVOT flow): 1.63 cm^2. - Left atrium: The atrium was severely dilated. - Atrial septum: No defect or patent foramen ovale was identified.    ASSESSMENT AND PLAN:  1. Hypertension -Borderline controlled, because she continues feeling tired and has significant family history of coronary artery disease, I'll schedule an exercise nuclear stress test and evaluate her blood pressure response to stress.   2. Fatigue and significant family history of premature coronary artery disease, she has never had a stress test we'll schedule an exercise nuclear stress test.   3. Hyperlipidemia - she is on pravastatin, her muscle pain seems to be related more to ski [place], will check all of her lites including magnesium and CK-MB today.   4. Carotid disease - minimal plaque on carotid US in 05/2015, follow up in 2 years. She has no bruit now.  5. 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  6. Snoring and fatigue - we will schedule a sleep study.  Labs today: CBC, TSH, CMP, lipids, follow up in 2 months.   Signed, Ena Dawley, MD  11/13/2016 12:23 PM    [facility], [place], [place]  [address] Phone: [phone]; Fax: [phone]   STOP SIMVASTATIN NOW  STOP RED YEAST RICE NOW  START PRAVASTATIN 20 MG ONCE DAILY   Labwork:  TODAY---CMET, MAGNESIUM, TSH, LIPIDS, AND CPK  LAB IN 2 MONTHS AS WELL ---TO CHECK A CMET    Follow-Up:  Your physician wants you to follow-up in: [place] will receive a reminder letter in the mail two months in advance. If you don't receive a letter, please  call our office to schedule the follow-up appointment.

## 2016-11-13 NOTE — Patient Instructions (Signed)
Medication Instructions:   STOP TAKING LASIX NOW  START TAKING HYDROCHLOROTHIAZIDE 25 MG ONCE DAILY    Labwork:  TODAY--CMET, MAGNESIUM, CBC W DIFF, TSH, LIPIDS    Testing/Procedures:  Your physician has requested that you have an echocardiogram. Echocardiography is a painless test that uses sound waves to create images of your heart. It provides your doctor with information about the size and shape of your heart and how well your heart's chambers and valves are working. This procedure takes approximately one hour. There are no restrictions for this procedure.   Your physician has requested that you have en exercise stress myoview. For further information please visit [email or web address]. Please follow instruction sheet, as given.   Your physician has recommended that you have a sleep study. This test records several body functions during sleep, including: brain activity, eye movement, oxygen and carbon dioxide blood levels, heart rate and rhythm, breathing rate and rhythm, the flow of air through your mouth and nose, snoring, body muscle movements, and chest and belly movement.    Follow-Up:  2 MONTHS WITH DR Meda Coffee       If you need a refill on your cardiac medications before your next appointment, please call your pharmacy.

## 2016-11-14 ENCOUNTER — Telehealth: Payer: Self-pay | Admitting: *Deleted

## 2016-11-14 DIAGNOSIS — E7800 Pure hypercholesterolemia, unspecified: Secondary | ICD-10-CM

## 2016-11-14 LAB — COMPREHENSIVE METABOLIC PANEL
ALT: 19 IU/L (ref 0–32)
AST: 17 IU/L (ref 0–40)
Albumin/Globulin Ratio: 1.6 (ref 1.2–2.2)
Albumin: 4.3 g/dL (ref 3.5–4.8)
Alkaline Phosphatase: 63 IU/L (ref 39–117)
BUN/Creatinine Ratio: 20 (ref 12–28)
BUN: 18 mg/dL (ref 8–27)
Bilirubin Total: 0.3 mg/dL (ref 0.0–1.2)
CO2: 25 mmol/L (ref 18–29)
Calcium: 10 mg/dL (ref 8.7–10.3)
Chloride: 100 mmol/L (ref 96–106)
Creatinine, Ser: 0.92 mg/dL (ref 0.57–1.00)
GFR calc Af Amer: 70 mL/min/{1.73_m2} (ref >59)
GFR calc non Af Amer: 61 mL/min/{1.73_m2} (ref >59)
Globulin, Total: 2.7 g/dL (ref 1.5–4.5)
Glucose: 100 mg/dL — ABNORMAL HIGH (ref 65–99)
Potassium: 3.9 mmol/L (ref 3.5–5.2)
Sodium: 142 mmol/L (ref 134–144)
Total Protein: 7 g/dL (ref 6.0–8.5)

## 2016-11-14 LAB — CBC WITH DIFFERENTIAL/PLATELET
Basophils Absolute: 0.1 10*3/uL (ref 0.0–0.2)
Basos: 1 %
EOS (ABSOLUTE): 0.1 10*3/uL (ref 0.0–0.4)
Eos: 2 %
Hematocrit: 36.6 % (ref 34.0–46.6)
Hemoglobin: 11.9 g/dL (ref 11.1–15.9)
Immature Grans (Abs): 0 10*3/uL (ref 0.0–0.1)
Immature Granulocytes: 0 %
Lymphocytes Absolute: 1.6 10*3/uL (ref 0.7–3.1)
Lymphs: 35 %
MCH: 27.6 pg (ref 26.6–33.0)
MCHC: 32.5 g/dL (ref 31.5–35.7)
MCV: 85 fL (ref 79–97)
Monocytes Absolute: 0.4 10*3/uL (ref 0.1–0.9)
Monocytes: 8 %
Neutrophils Absolute: 2.5 10*3/uL (ref 1.4–7.0)
Neutrophils: 54 %
Platelets: 257 10*3/uL (ref 150–379)
RBC: 4.31 x10E6/uL (ref 3.77–5.28)
RDW: 14.9 % (ref 12.3–15.4)
WBC: 4.6 10*3/uL (ref 3.4–10.8)

## 2016-11-14 LAB — TSH: TSH: 3.51 u[IU]/mL (ref 0.450–4.500)

## 2016-11-14 LAB — LIPID PANEL
Chol/HDL Ratio: 3.1 ratio units (ref 0.0–4.4)
Cholesterol, Total: 285 mg/dL — ABNORMAL HIGH (ref 100–199)
HDL: 92 mg/dL (ref >39)
LDL Calculated: 182 mg/dL — ABNORMAL HIGH (ref 0–99)
Triglycerides: 54 mg/dL (ref 0–149)
VLDL Cholesterol Cal: 11 mg/dL (ref 5–40)

## 2016-11-14 LAB — MAGNESIUM: Magnesium: 1.9 mg/dL (ref 1.6–2.3)

## 2016-11-14 NOTE — Telephone Encounter (Signed)
Notified the pt that per Dr Meda Coffee, all her labs were normal except for elevated LDL.  Informed the pt that per Meda Coffee, she recommends that we recheck her lipids and cmet in 2 months.  Per the pt, she agrees with this plan, but will have to call back to schedule her lab appt, for she is out of town and doesn't have her calendar at hand.  Informed the pt that would be perfectly fine and we will schedule her lab appt accordingly, when she gets back to [place].  Pt verbalized understanding and agrees with this plan.  Pt gracious for all the assistance provided.   Orders for cmet and lipids will be placed, and only the lab appt to be made, when pt contacts the office back.

## 2016-11-14 NOTE — Telephone Encounter (Signed)
-----   Message from Dorothy Spark, MD sent at 11/14/2016 12:02 PM EST ----- All labs normal except for elevated LDL, we will recheck lipids and CMP in 2 months.

## 2016-11-19 ENCOUNTER — Ambulatory Visit (INDEPENDENT_AMBULATORY_CARE_PROVIDER_SITE_OTHER): Payer: PPO | Admitting: Family Medicine

## 2016-11-19 ENCOUNTER — Encounter: Payer: Self-pay | Admitting: Family Medicine

## 2016-11-19 VITALS — BP 120/70 | HR 68 | Temp 98.2°F | Ht 62.0 in | Wt 192.4 lb

## 2016-11-19 DIAGNOSIS — J4541 Moderate persistent asthma with (acute) exacerbation: Secondary | ICD-10-CM

## 2016-11-19 MED ORDER — PREDNISONE 20 MG PO TABS: 40.0000 mg | ORAL_TABLET | Freq: Every day | ORAL | 0 refills | Status: DC

## 2016-11-19 NOTE — Progress Notes (Signed)
Pre visit review using our clinic review tool, if applicable. No additional management support is needed unless otherwise documented below in the visit note. 

## 2016-11-19 NOTE — Patient Instructions (Signed)
Start the prednisone today and take 40mg  daily for 4 days.  Albuterol as needed  Follow up promptly if worsening or new concerns or not improving with treatment.

## 2016-11-19 NOTE — Progress Notes (Signed)
HPI:   Brianna Mccarty is a very pleasant 76 year old with asthma here for an acute visit for cough and congestion. Started about 4-5 days ago. Has had cough, clear nasal congestion, some wheezing and increase alb use twice daily. Denies fevers, body aches, productive cough, sob, rash, ear pain, sinus pain, flu exposure. ROS: See pertinent positives and negatives per HPI.  Past Medical History:  Diagnosis Date  . Abnormal EKG    Normal LV function in the past  . Allergic rhinitis   . Asthma   . Bronchitis, mucopurulent recurrent ([place])   . Carotid artery disease ([place])    Doppler, April, 2009, 0-39% bilateral  . Cervical dysplasia 1971  . Chest pain, unspecified   . Diverticulosis of colon   . DJD (degenerative joint disease)   . Ejection fraction    EF 60%, echo, 2009  . GERD (gastroesophageal reflux disease)   . Headache(784.0)   . Hypercholesterolemia   . Hypertension   . Interstitial cystitis    sees urologist  . Lichen sclerosus   . Malignant melanoma ([place])    sees Dr. [place] Crane in dermatology  . Migraines   . Mitral valve disease    Question mitral valve prolapse in the past, no prolapse by echo 2009  . Murmur 10/20/2015  . Osteoporosis    on fosomax > 5 years, stopped 11/2015  . Renal calculus    sees urologist  . Thyroid cyst    1 x 1.1 thyroid cyst noted on carotid Doppler, January, 2012  . Venous insufficiency     Past Surgical History:  Procedure Laterality Date  . BREAST SURGERY  2013   Breast Bx-Benign  . CARDIAC CATHETERIZATION    . CATARACT EXTRACTION, BILATERAL    . cystoscopy and basket stone removal right ureter  02/2006   Dr. Terance Hart  . GYNECOLOGIC CRYOSURGERY  1971  . left total hip replacement  2004   Dr. Gladstone Lighter  . melanoma removed from medial rleft knee area  2006   Dr. [place] Crane  . NASAL SEPTUM SURGERY      Family History  Problem Relation Age of Onset  . Cancer Father     Pancreatic  . Diabetes Mother   . Heart disease Mother   . Cancer  Brother     Bile duct  . Diabetes Brother   . Hypertension Brother   . Diabetes Brother   . Heart disease Brother   . Hypertension Brother   . Hypertension Sister   . Hypertension Sister   . Colon cancer Paternal Uncle     Social History   Social History  . Marital status: Married    Spouse name: Edison Nasuti  . Number of children: 2  . Years of education: N/A   Occupational History  . retired Retired   Social History Main Topics  . Smoking status: Never Smoker  . Smokeless tobacco: Never Used  . Alcohol use 0.6 oz/week    1 Standard drinks or equivalent per week     Comment: social use  . Drug use: No  . Sexual activity: No     Comment: 1st intercourse 10 yo-1 partner   Other Topics Concern  . None   Social History Narrative   Updated 11/2015   Work or School: none      Home Situation: lives with husband      Spiritual Beliefs: christian      Lifestyle: regular exercise; healthy diet  Current Outpatient Prescriptions:  .  acyclovir (ZOVIRAX) 400 MG tablet, 400mg  tid x5 days for outbreak, Disp: 15 tablet, Rfl: 3 .  albuterol (PROAIR HFA) 108 (90 BASE) MCG/ACT inhaler, Inhale 2 puffs into the lungs every 6 (six) hours as needed for wheezing., Disp: 3 Inhaler, Rfl: 0 .  albuterol (PROVENTIL) (2.5 MG/3ML) 0.083% nebulizer solution, Take 3 mLs (2.5 mg total) by nebulization every 6 (six) hours as needed., Disp: 360 mL, Rfl: 0 .  amLODipine (NORVASC) 10 MG tablet, TAKE 1 TABLET BY MOUTH EVERY DAY, Disp: 90 tablet, Rfl: 0 .  Ascorbic Acid (VITAMIN C) 500 MG tablet, Take 500 mg by mouth daily.  , Disp: , Rfl:  .  aspirin 81 MG tablet, Take 81 mg by mouth daily.  , Disp: , Rfl:  .  budesonide-formoterol (SYMBICORT) 160-4.5 MCG/ACT inhaler, Inhale 2 puffs into the lungs 2 (two) times daily., Disp: 3 Inhaler, Rfl: 3 .  Calcium Carbonate (CALCIUM 600) 1500 MG TABS, Take 1 tablet by mouth daily.  , Disp: , Rfl:  .  Cholecalciferol (VITAMIN D3) 5000 UNITS CAPS, Take 1  capsule by mouth daily.  , Disp: , Rfl:  .  Cinnamon 500 MG TABS, Take by mouth. With Chromium, Disp: , Rfl:  .  clobetasol cream (TEMOVATE) [id number] %, Apply 1 application topically at bedtime as needed., Disp: 30 g, Rfl: 1 .  Coenzyme Q10 (COQ10) 100 MG CAPS, Take 1 capsule by mouth daily.  , Disp: , Rfl:  .  desmopressin (DDAVP) 0.2 MG tablet, Take 3 tablets by mouth daily at bedtime , Disp: , Rfl:  .  hydrALAZINE (APRESOLINE) 50 MG tablet, Take 1 tablet (50 mg total) by mouth 3 (three) times daily., Disp: 90 tablet, Rfl: 11 .  hydrochlorothiazide (HYDRODIURIL) 25 MG tablet, Take 1 tablet (25 mg total) by mouth daily., Disp: 90 tablet, Rfl: 3 .  hydrOXYzine (ATARAX/VISTARIL) 10 MG tablet, Take 10 mg by mouth daily., Disp: , Rfl:  .  losartan (COZAAR) 100 MG tablet, TAKE 1 TABLET (100 MG TOTAL) BY MOUTH DAILY., Disp: 90 tablet, Rfl: 1 .  magnesium oxide (MAG-OX) 400 MG tablet, Take 400 mg by mouth daily.  , Disp: , Rfl:  .  Multiple Vitamin (MULTIVITAMIN) capsule, Take 1 capsule by mouth daily.  , Disp: , Rfl:  .  omeprazole (PRILOSEC) 40 MG capsule, TAKE 1 CAPSULE (40 MG TOTAL) BY MOUTH DAILY., Disp: 90 capsule, Rfl: 1 .  OVER THE COUNTER MEDICATION, Bone growth factor, Disp: , Rfl:  .  OVER THE COUNTER MEDICATION, 3 oz daily. Nopolea, Disp: , Rfl:  .  OVER THE COUNTER MEDICATION, Ocuvite 50 +, Disp: , Rfl:  .  OVER THE COUNTER MEDICATION, Juice plus, Disp: , Rfl:  .  pentosan polysulfate (ELMIRON) 100 MG capsule, Take 100 mg by mouth 3 (three) times daily before meals.  , Disp: , Rfl:  .  pravastatin (PRAVACHOL) 20 MG tablet, Take 1 tablet (20 mg total) by mouth every evening., Disp: 90 tablet, Rfl: 3 .  prazosin (MINIPRESS) 5 MG capsule, TAKE 1 CAPSULE (5 MG TOTAL) BY MOUTH 2 (TWO) TIMES DAILY., Disp: 180 capsule, Rfl: 1 .  PRESCRIPTION MEDICATION, Allergy shots every other week, Disp: , Rfl:  .  Probiotic Product (PROBIOTIC PO), 1 daily, Disp: , Rfl:  .  predniSONE (DELTASONE) 20 MG tablet,  Take 2 tablets (40 mg total) by mouth daily with breakfast., Disp: 8 tablet, Rfl: 0  EXAM:  Vitals:   11/19/16 1616  BP:  120/70  Pulse: 68  Temp: 98.2 F (36.8 C)    Body mass index is 35.19 kg/m.  GENERAL: vitals reviewed and listed above, alert, oriented, appears well hydrated and in no acute distress  HEENT: atraumatic, conjunttiva clear, no obvious abnormalities on inspection of external nose and ears, normal appearance of ear canals and TMs, clear nasal congestion, mild post oropharyngeal erythema with PND, no tonsillar edema or exudate, no sinus TTP  NECK: no obvious masses on inspection  LUNGS: clear to auscultation bilaterally, no wheezes, rales or rhonchi, good air movement  CV: HRRR, no peripheral edema  MS: moves all extremities without noticeable abnormality  PSYCH: pleasant and cooperative, no obvious depression or anxiety  ASSESSMENT AND PLAN:  Discussed the following assessment and plan:  Moderate persistent asthma with acute exacerbation  -discussed likely mild cold or viral uri with asthma exacerbation -she opted for course of prednisone after discussion risks -discuss potential for mild flu, she declined testing or evaluation for this, symptoms mild and outside window for likely benefit from tamiflu so reasonable -Patient advised to return or notify a doctor immediately if symptoms worsen or persist or new concerns arise.  Patient Instructions  Start the prednisone today and take 40mg  daily for 4 days.  Albuterol as needed  Follow up promptly if worsening or new concerns or not improving with treatment.   Colin Benton R., DO

## 2016-11-21 ENCOUNTER — Telehealth: Payer: Self-pay | Admitting: Internal Medicine

## 2016-11-21 MED ORDER — AZITHROMYCIN 250 MG PO TABS: ORAL_TABLET | ORAL | 0 refills | Status: DC

## 2016-11-21 NOTE — Telephone Encounter (Signed)
Pt called.  Was seen 11/19/16 for cough and congestion.  Symptoms started last week.  She has a history of asthma.  Using frequent nebs and her inhalers.  Taking prednisone.  Now has increased cough and productive yellow/green mucus.  Has developed fever tmax 101.  Taking tylenol.  States was told if symptoms worsen, abx would be called in.  Spoke to pt and she reports with her history of asthma, she is easy to progress to bronchitis.  Has taken azithromycin in the past and tolerated well.  On a probiotic.  She will call office tomorrow and be seen.  Any worsening symptoms this pm, will be seen today.

## 2016-11-25 NOTE — Telephone Encounter (Signed)
I called the pt and she stated she is feeling a lot better today.  Message forwarded to Dr Maudie Mercury.

## 2016-11-27 ENCOUNTER — Ambulatory Visit (INDEPENDENT_AMBULATORY_CARE_PROVIDER_SITE_OTHER): Payer: PPO | Admitting: Family Medicine

## 2016-11-27 ENCOUNTER — Encounter: Payer: Self-pay | Admitting: Family Medicine

## 2016-11-27 VITALS — BP 128/76 | HR 64 | Temp 98.6°F | Resp 20 | Ht 62.0 in | Wt 182.5 lb

## 2016-11-27 DIAGNOSIS — J4541 Moderate persistent asthma with (acute) exacerbation: Secondary | ICD-10-CM | POA: Diagnosis not present

## 2016-11-27 MED ORDER — PREDNISONE 20 MG PO TABS: ORAL_TABLET | ORAL | 0 refills | Status: DC

## 2016-11-27 MED ORDER — BENZONATATE 100 MG PO CAPS: 100.0000 mg | ORAL_CAPSULE | Freq: Three times a day (TID) | ORAL | 0 refills | Status: DC | PRN

## 2016-11-27 MED ORDER — METHYLPREDNISOLONE ACETATE 80 MG/ML IJ SUSP
40.0000 mg | Freq: Once | INTRAMUSCULAR | Status: AC
  Administered 2016-11-27: 40 mg via INTRAMUSCULAR

## 2016-11-27 NOTE — Patient Instructions (Signed)
Please start your prednisone tablets tomorrow with breakfast Take plain Mucinex as directed on package- generic is fine Continue to hydrate, drinking enough liquids to make your urine light yellow. I have sent in benzonate cough tablets to your pharmachy If you are not better in 48 hours or if your fever returns, please notify me or Dr. Maudie Mercury If you develop SOB- call 911

## 2016-11-27 NOTE — Progress Notes (Addendum)
Subjective:    Patient ID: Brianna Mccarty, female    DOB: 02-25-1941, 76 y.o.   MRN: [id number]  HPI This is a pleasant 76 yo female who presents today for an acute visit. She continues to wheeze and cough and feel fatigued for about 10 days.  She was seen 11/19/16 by her PCP, Dr. Maudie Mercury, for moderate persistent asthma with acute exacerbation. She presented with cough and congestion for about 4-5 days. She was using her albuterol 2x/day. She denied fevers. She was given prednisone 40 mg qd x 4 days. Record shows that Dr. Nicki Reaper called the patient on 11/21/16 (office was closed due to snow) and the patient was prescribed Zpack for increased sputum production and temp to 101.  She felt a little better after starting prednisone. She finished azithromycin two days ago. With antibiotic, sputum has paled to light yellow and cough has decreased. Felt better two days ago and then worse yesterday with regards to chest tightness and wheezing. Had a treatment right before she came for her appointment today. No further fever since starting antibiotic. Has slept well the last two nights. Has not taken anything specifically for cough or any guaifenesin. Little nasal drainage. Had some ear pressure, this has improved. Has history of seasonal allergies and takes medication year round as well as bi-weekly allergy shots.   Past Medical History:  Diagnosis Date  . Abnormal EKG    Normal LV function in the past  . Allergic rhinitis   . Asthma   . Bronchitis, mucopurulent recurrent ([place])   . Carotid artery disease ([place])    Doppler, April, 2009, 0-39% bilateral  . Cervical dysplasia 1971  . Chest pain, unspecified   . Diverticulosis of colon   . DJD (degenerative joint disease)   . Ejection fraction    EF 60%, echo, 2009  . GERD (gastroesophageal reflux disease)   . Headache(784.0)   . Hypercholesterolemia   . Hypertension   . Interstitial cystitis    sees urologist  . Lichen sclerosus   . Malignant melanoma  ([place])    sees Dr. Nevada Crane in dermatology  . Migraines   . Mitral valve disease    Question mitral valve prolapse in the past, no prolapse by echo 2009  . Murmur 10/20/2015  . Osteoporosis    on fosomax > 5 years, stopped 11/2015  . Renal calculus    sees urologist  . Thyroid cyst    1 x 1.1 thyroid cyst noted on carotid Doppler, January, 2012  . Venous insufficiency    Past Surgical History:  Procedure Laterality Date  . BREAST SURGERY  2013   Breast Bx-Benign  . CARDIAC CATHETERIZATION    . CATARACT EXTRACTION, BILATERAL    . cystoscopy and basket stone removal right ureter  02/2006   Dr. Terance Hart  . GYNECOLOGIC CRYOSURGERY  1971  . left total hip replacement  2004   Dr. Gladstone Lighter  . melanoma removed from medial rleft knee area  2006   Dr. [place] Crane  . NASAL SEPTUM SURGERY     Family History  Problem Relation Age of Onset  . Cancer Father     Pancreatic  . Diabetes Mother   . Heart disease Mother   . Cancer Brother     Bile duct  . Diabetes Brother   . Hypertension Brother   . Diabetes Brother   . Heart disease Brother   . Hypertension Brother   . Hypertension Sister   . Hypertension Sister   .  Colon cancer Paternal Uncle    Social History  Substance Use Topics  . Smoking status: Never Smoker  . Smokeless tobacco: Never Used  . Alcohol use 0.6 oz/week    1 Standard drinks or equivalent per week     Comment: social use      Review of Systems Per HPI    Objective:   Physical Exam  Constitutional: She is oriented to person, place, and time. She appears well-developed and well-nourished. No distress.  HENT:  Head: Normocephalic and atraumatic.  Right Ear: Tympanic membrane, external ear and ear canal normal.  Left Ear: Tympanic membrane, external ear and ear canal normal.  Nose: Nose normal.  Mouth/Throat: Oropharynx is clear and moist.  Eyes: Conjunctivae are normal.  Neck: Normal range of motion. Neck supple.  Cardiovascular: Normal rate, regular rhythm and  normal heart sounds.   Pulmonary/Chest: Effort normal. She has wheezes.  She is able to converse without dyspnea. Frequent, dry, tight cough observed. Fine expiratory wheezes posteriorly.  Musculoskeletal: She exhibits no edema.  Lymphadenopathy:    She has no cervical adenopathy.  Neurological: She is alert and oriented to person, place, and time.  Skin: Skin is warm and dry. She is not diaphoretic.  Psychiatric: She has a normal mood and affect. Her behavior is normal. Judgment and thought content normal.  Vitals reviewed.     BP 128/76 (BP Location: Left Arm, Patient Position: Sitting, Cuff Size: Normal)   Pulse 64   Temp 98.6 F (37 C) (Oral)   Resp 20   Ht 5\' 2"  (1.575 m)   Wt 182 lb 8 oz (82.8 kg)   SpO2 96%   BMI 33.38 kg/m  Wt Readings from Last 3 Encounters:  11/27/16 182 lb 8 oz (82.8 kg)  11/19/16 192 lb 6.4 oz (87.3 kg)  11/13/16 190 lb (86.2 kg)       Assessment & Plan:  1. Moderate persistent asthma with acute exacerbation - Patient with resolved fever and decreased sputum, do not think she needs additional antibiotic at this time, will treat bronchospasm/asthma with steroid injection followed by oral prednisone - Provided written and verbal information regarding diagnosis and treatment. - RTC precautions/ER reviewed - add plain Mucinex, increase fluids - methylPREDNISolone acetate (DEPO-MEDROL) injection 40 mg; Inject 0.5 mLs (40 mg total) into the muscle once. - predniSONE (DELTASONE) 20 MG tablet; Take 3 tablets x 2 days, then 2 x 3 days, then 1 x 3 days.  Dispense: 15 tablet; Refill: 0 - benzonatate (TESSALON) 100 MG capsule; Take 1-2 capsules (100-200 mg total) by mouth 3 (three) times daily as needed for cough.  Dispense: 24 capsule; Refill: 0   Clarene Reamer, FNP-BC  [facility]  11/27/2016 2:53 PM

## 2016-11-27 NOTE — Progress Notes (Signed)
Pre visit review using our clinic review tool, if applicable. No additional management support is needed unless otherwise documented below in the visit note. 

## 2016-11-28 ENCOUNTER — Encounter (HOSPITAL_COMMUNITY): Payer: PPO

## 2016-11-28 ENCOUNTER — Other Ambulatory Visit (HOSPITAL_COMMUNITY): Payer: PPO

## 2016-12-05 ENCOUNTER — Telehealth (HOSPITAL_COMMUNITY): Payer: Self-pay | Admitting: *Deleted

## 2016-12-05 NOTE — Telephone Encounter (Signed)
Left message on voicemail per DPR in reference to upcoming appointment scheduled on 12/10/16 with detailed instructions given per Myocardial Perfusion Study Information Sheet for the test. LM to arrive 15 minutes early, and that it is imperative to arrive on time for appointment to keep from having the test rescheduled. If you need to cancel or reschedule your appointment, please call the office within 24 hours of your appointment. Failure to do so may result in a cancellation of your appointment, and a $50 no show fee. Phone number given for call back for any questions. Kirstie Peri

## 2016-12-06 DIAGNOSIS — J301 Allergic rhinitis due to pollen: Secondary | ICD-10-CM | POA: Diagnosis not present

## 2016-12-06 DIAGNOSIS — J3089 Other allergic rhinitis: Secondary | ICD-10-CM | POA: Diagnosis not present

## 2016-12-09 ENCOUNTER — Ambulatory Visit (INDEPENDENT_AMBULATORY_CARE_PROVIDER_SITE_OTHER): Payer: PPO | Admitting: Family Medicine

## 2016-12-09 VITALS — BP 128/64 | HR 68 | Temp 98.2°F | Ht 62.0 in | Wt 190.2 lb

## 2016-12-09 DIAGNOSIS — F419 Anxiety disorder, unspecified: Secondary | ICD-10-CM

## 2016-12-09 DIAGNOSIS — I10 Essential (primary) hypertension: Secondary | ICD-10-CM

## 2016-12-09 DIAGNOSIS — F439 Reaction to severe stress, unspecified: Secondary | ICD-10-CM | POA: Diagnosis not present

## 2016-12-09 DIAGNOSIS — E162 Hypoglycemia, unspecified: Secondary | ICD-10-CM

## 2016-12-09 NOTE — Progress Notes (Signed)
HPI:  AWV 07/2016 Here for acute visit for HTN and anxiety. Reports dealing with daughter whom is alcoholic and whenever she is not doing well Prissy becomes anxious and stressed. She feels it contributes to her blood pressure issues. Feels anxious and stress intermittently for years. No persistent or generalized worry, panic, depression, irritability, manic symptoms, thoughts of self harm or thoughts of feeling out of control. Prefers to avoid medications if possible. She also has concerns about low blood sugar. Occasionally feels detached and tired when first wakes up in the morning then feels better after breakfast - she has had this pretty much her whole life. Would like to check her blood sugar when feels this way.  ROS: See pertinent positives and negatives per HPI.  Past Medical History:  Diagnosis Date  . Abnormal EKG    Normal LV function in the past  . Allergic rhinitis   . Asthma   . Bronchitis, mucopurulent recurrent ([place])   . Carotid artery disease ([place])    Doppler, April, 2009, 0-39% bilateral  . Cervical dysplasia 1971  . Chest pain, unspecified   . Diverticulosis of colon   . DJD (degenerative joint disease)   . Ejection fraction    EF 60%, echo, 2009  . GERD (gastroesophageal reflux disease)   . Headache(784.0)   . Hypercholesterolemia   . Hypertension   . Interstitial cystitis    sees urologist  . Lichen sclerosus   . Malignant melanoma ([place])    sees Dr. Nevada Crane in dermatology  . Migraines   . Mitral valve disease    Question mitral valve prolapse in the past, no prolapse by echo 2009  . Murmur 10/20/2015  . Osteoporosis    on fosomax > 5 years, stopped 11/2015  . Renal calculus    sees urologist  . Thyroid cyst    1 x 1.1 thyroid cyst noted on carotid Doppler, January, 2012  . Venous insufficiency     Past Surgical History:  Procedure Laterality Date  . BREAST SURGERY  2013   Breast Bx-Benign  . CARDIAC CATHETERIZATION    . CATARACT EXTRACTION,  BILATERAL    . cystoscopy and basket stone removal right ureter  02/2006   Dr. Terance Hart  . GYNECOLOGIC CRYOSURGERY  1971  . left total hip replacement  2004   Dr. Gladstone Lighter  . melanoma removed from medial rleft knee area  2006   Dr. Nevada Crane  . NASAL SEPTUM SURGERY      Family History  Problem Relation Age of Onset  . Cancer Father     Pancreatic  . Diabetes Mother   . Heart disease Mother   . Cancer Brother     Bile duct  . Diabetes Brother   . Hypertension Brother   . Diabetes Brother   . Heart disease Brother   . Hypertension Brother   . Hypertension Sister   . Hypertension Sister   . Colon cancer Paternal Uncle     Social History   Social History  . Marital status: Married    Spouse name: Edison Nasuti  . Number of children: 2  . Years of education: N/A   Occupational History  . retired Retired   Social History Main Topics  . Smoking status: Never Smoker  . Smokeless tobacco: Never Used  . Alcohol use 0.6 oz/week    1 Standard drinks or equivalent per week     Comment: social use  . Drug use: No  . Sexual activity: No  Comment: 1st intercourse 43 yo-1 partner   Other Topics Concern  . Not on file   Social History Narrative   Updated 11/2015   Work or School: none      Home Situation: lives with husband      Spiritual Beliefs: christian      Lifestyle: regular exercise; healthy diet           Current Outpatient Prescriptions:  .  acyclovir (ZOVIRAX) 400 MG tablet, 400mg  tid x5 days for outbreak, Disp: 15 tablet, Rfl: 3 .  albuterol (PROAIR HFA) 108 (90 BASE) MCG/ACT inhaler, Inhale 2 puffs into the lungs every 6 (six) hours as needed for wheezing., Disp: 3 Inhaler, Rfl: 0 .  albuterol (PROVENTIL) (2.5 MG/3ML) 0.083% nebulizer solution, Take 3 mLs (2.5 mg total) by nebulization every 6 (six) hours as needed., Disp: 360 mL, Rfl: 0 .  amLODipine (NORVASC) 10 MG tablet, TAKE 1 TABLET BY MOUTH EVERY DAY, Disp: 90 tablet, Rfl: 0 .  Ascorbic Acid (VITAMIN C) 500  MG tablet, Take 500 mg by mouth daily.  , Disp: , Rfl:  .  aspirin 81 MG tablet, Take 81 mg by mouth daily.  , Disp: , Rfl:  .  azelastine (ASTELIN) 0.1 % nasal spray, USE 2 SPRAYS IN EACH NOSTRIL TWICE A DAY, AS NEEDED FOR NASAL ALLERGY SYMPTOMS, Disp: , Rfl: 5 .  benzonatate (TESSALON) 100 MG capsule, Take 1-2 capsules (100-200 mg total) by mouth 3 (three) times daily as needed for cough., Disp: 24 capsule, Rfl: 0 .  budesonide-formoterol (SYMBICORT) 160-4.5 MCG/ACT inhaler, Inhale 2 puffs into the lungs 2 (two) times daily., Disp: 3 Inhaler, Rfl: 3 .  Calcium Carbonate (CALCIUM 600) 1500 MG TABS, Take 1 tablet by mouth daily.  , Disp: , Rfl:  .  Cholecalciferol (VITAMIN D3) 5000 UNITS CAPS, Take 1 capsule by mouth daily.  , Disp: , Rfl:  .  Cinnamon 500 MG TABS, Take by mouth. With Chromium, Disp: , Rfl:  .  clobetasol cream (TEMOVATE) [id number] %, Apply 1 application topically at bedtime as needed., Disp: 30 g, Rfl: 1 .  Coenzyme Q10 (COQ10) 100 MG CAPS, Take 1 capsule by mouth daily.  , Disp: , Rfl:  .  desmopressin (DDAVP) 0.2 MG tablet, Take 3 tablets by mouth daily at bedtime , Disp: , Rfl:  .  fexofenadine (ALLEGRA) 60 MG tablet, Take 60 mg by mouth daily., Disp: , Rfl:  .  hydrALAZINE (APRESOLINE) 50 MG tablet, Take 1 tablet (50 mg total) by mouth 3 (three) times daily., Disp: 90 tablet, Rfl: 11 .  hydrochlorothiazide (HYDRODIURIL) 25 MG tablet, Take 1 tablet (25 mg total) by mouth daily., Disp: 90 tablet, Rfl: 3 .  hydrOXYzine (ATARAX/VISTARIL) 10 MG tablet, Take 10 mg by mouth daily., Disp: , Rfl:  .  losartan (COZAAR) 100 MG tablet, TAKE 1 TABLET (100 MG TOTAL) BY MOUTH DAILY., Disp: 90 tablet, Rfl: 1 .  magnesium oxide (MAG-OX) 400 MG tablet, Take 400 mg by mouth daily.  , Disp: , Rfl:  .  Multiple Vitamin (MULTIVITAMIN) capsule, Take 1 capsule by mouth daily.  , Disp: , Rfl:  .  omeprazole (PRILOSEC) 40 MG capsule, TAKE 1 CAPSULE (40 MG TOTAL) BY MOUTH DAILY., Disp: 90 capsule, Rfl: 1 .   OVER THE COUNTER MEDICATION, Bone growth factor, Disp: , Rfl:  .  OVER THE COUNTER MEDICATION, 3 oz daily. [place], Disp: , Rfl:  .  OVER THE COUNTER MEDICATION, Ocuvite 50 +, Disp: , Rfl:  .  OVER THE COUNTER MEDICATION, Juice plus, Disp: , Rfl:  .  OVER THE COUNTER MEDICATION, Occuvite 50+ eye vitamin, Disp: , Rfl:  .  pentosan polysulfate (ELMIRON) 100 MG capsule, Take 100 mg by mouth 3 (three) times daily before meals.  , Disp: , Rfl:  .  prazosin (MINIPRESS) 5 MG capsule, TAKE 1 CAPSULE (5 MG TOTAL) BY MOUTH 2 (TWO) TIMES DAILY., Disp: 180 capsule, Rfl: 1 .  predniSONE (DELTASONE) 20 MG tablet, Take 3 tablets x 2 days, then 2 x 3 days, then 1 x 3 days., Disp: 15 tablet, Rfl: 0 .  PRESCRIPTION MEDICATION, Allergy shots every other week, Disp: , Rfl:  .  Probiotic Product (PROBIOTIC PO), 1 daily, Disp: , Rfl:   EXAM:  Vitals:   12/09/16 1457  BP: 128/64  Pulse: 68  Temp: 98.2 F (36.8 C)    Body mass index is 34.79 kg/m.  GENERAL: vitals reviewed and listed above, alert, oriented, appears well hydrated and in no acute distress  HEENT: atraumatic, conjunttiva clear, no obvious abnormalities on inspection of external nose and ears  NECK: no obvious masses on inspection  LUNGS: clear to auscultation bilaterally, no wheezes, rales or rhonchi, good air movement  CV: HRRR, no peripheral edema  MS: moves all extremities without noticeable abnormality  PSYCH: pleasant and cooperative, no obvious depression or anxiety  ASSESSMENT AND PLAN:  Discussed the following assessment and plan:  Essential hypertension Stress Anxiety ? Low blood sugar  -BP a little high on arrival but good on recheck - cont current tx and eval with cardiology as planned -discussed tx options for stress and anxiety - she prefers non-pharm interventions for now and has decided to try CBT, resources provided for her to schedule -I think her feelings in the morning are likely anxiety, glu monitor provided  to check when feels this way and to keep a log for next visit -Patient advised to return or notify a doctor immediately if symptoms worsen or persist or new concerns arise.  Patient Instructions  Keep follow up as scheduled.  Call the number provided to set up CBT (cognitive behavioral therapy) for stress.  Follow up sooner as needed.   Colin Benton R., DO

## 2016-12-09 NOTE — Progress Notes (Signed)
Pre visit review using our clinic review tool, if applicable. No additional management support is needed unless otherwise documented below in the visit note. 

## 2016-12-09 NOTE — Patient Instructions (Signed)
Keep follow up as scheduled.  Call the number provided to set up CBT (cognitive behavioral therapy) for stress.  Follow up sooner as needed.

## 2016-12-10 ENCOUNTER — Encounter (HOSPITAL_COMMUNITY): Payer: PPO

## 2016-12-10 ENCOUNTER — Other Ambulatory Visit (HOSPITAL_COMMUNITY): Payer: PPO

## 2016-12-10 NOTE — Progress Notes (Signed)
Medical screening examination/treatment/procedure(s) were performed by non-physician practitioner and as supervising physician I was immediately available for consultation/collaboration. I agree with above. Mervyn Pflaum, DO   

## 2016-12-13 ENCOUNTER — Ambulatory Visit (HOSPITAL_BASED_OUTPATIENT_CLINIC_OR_DEPARTMENT_OTHER): Payer: PPO | Attending: Cardiology | Admitting: Cardiology

## 2016-12-13 DIAGNOSIS — R0683 Snoring: Secondary | ICD-10-CM

## 2016-12-13 DIAGNOSIS — Z79899 Other long term (current) drug therapy: Secondary | ICD-10-CM | POA: Diagnosis not present

## 2016-12-13 DIAGNOSIS — R0902 Hypoxemia: Secondary | ICD-10-CM | POA: Diagnosis not present

## 2016-12-15 NOTE — Procedures (Signed)
   Patient Name: Brianna Mccarty, Brianna Mccarty Date: 12/13/2016 Gender: Female D.O.B: 1940/11/12 Age (years): 76 Referring Provider: Dorothy Spark Height (inches): 18 Interpreting Physician: Fransico Him MD, ABSM Weight (lbs): 190 RPSGT: Earney Hamburg BMI: 35 MRN: [id number] Neck Size: 15.00  CLINICAL INFORMATION Sleep Study Type: NPSG Indication for sleep study: Snoring Epworth Sleepiness Score: 3  SLEEP STUDY TECHNIQUE As per the AASM Manual for the Scoring of Sleep and Associated Events v2.3 (April 2016) with a hypopnea requiring 4% desaturations.  The channels recorded and monitored were frontal, central and occipital EEG, electrooculogram (EOG), submentalis EMG (chin), nasal and oral airflow, thoracic and abdominal wall motion, anterior tibialis EMG, snore microphone, electrocardiogram, and pulse oximetry.  MEDICATIONS Medications self-administered by patient taken the night of the study : Azalastine Hydrocholoride nasal spray  SLEEP ARCHITECTURE The study was initiated at 10:47:39 PM and ended at 4:53:57 AM.  Sleep onset time was 68.9 minutes and the sleep efficiency was 10.2%. The total sleep time was 37.5 minutes.  Stage REM latency was N/A minutes.  The patient spent 29.33% of the night in stage N1 sleep, 70.67% in stage N2 sleep, 0.00% in stage N3 and 0.00% in REM.  Alpha intrusion was absent.  Supine sleep was 44.00%.  RESPIRATORY PARAMETERS The overall apnea/hypopnea index (AHI) was 4.8 per hour. There were 0 total apneas, including 0 obstructive, 0 central and 0 mixed apneas. There were 3 hypopneas and 14 RERAs.  The AHI during Stage REM sleep was N/A per hour.  AHI while supine was 10.9 per hour.  The mean oxygen saturation was 92.61%. The minimum SpO2 during sleep was 87.00%.  Nocturnal hypoxemia was not noted during the study but the patient had oxygen saturations < 88% for at least 17 minutes during the wakeful period.   Moderate snoring was noted  during this study.  CARDIAC DATA The 2 lead EKG demonstrated sinus rhythm. The mean heart rate was 58.56 beats per minute. Other EKG findings include: PVCs.  LEG MOVEMENT DATA The total PLMS were 0 with a resulting PLMS index of 0.00. Associated arousal with leg movement index was 0.0 .  IMPRESSIONS - No significant obstructive sleep apnea occurred during this study (AHI = 4.8/h) but patient only slept for 37 minutes and therefore inadequate sleep time to rule out sleep apnea. - No significant central sleep apnea occurred during this study (CAI = 0.0/h). - Oxygen desaturation was noted during this study (Min O2 = 87.00%). - The patient snored with Moderate snoring volume. - EKG findings include PVCs. - Clinically significant periodic limb movements did not occur during sleep. No significant associated arousals.  DIAGNOSIS - Hypoxemia (327.26 [G47.36 ICD-10])  RECOMMENDATIONS - The patient did not have adequate sleep time to evaluate for sleep apnea.  Recommend repeat sleep study with sleep aide. - Recommend daytime oxygen saturation test at rest and with ambulation to assess for daytime hypoxemia, - Positional therapy avoiding supine position during sleep. - Avoid alcohol, sedatives and other CNS depressants that may worsen sleep apnea and disrupt normal sleep architecture. - Sleep hygiene should be reviewed to assess factors that may improve sleep quality. - Weight management and regular exercise should be initiated or continued if appropriate.  [place], American Board of Sleep Medicine  ELECTRONICALLY SIGNED ON:  12/15/2016, 9:40 PM [place] PH: [phone]   FX: [phone] [place]

## 2016-12-17 DIAGNOSIS — X32XXXD Exposure to sunlight, subsequent encounter: Secondary | ICD-10-CM | POA: Diagnosis not present

## 2016-12-17 DIAGNOSIS — L570 Actinic keratosis: Secondary | ICD-10-CM | POA: Diagnosis not present

## 2016-12-17 DIAGNOSIS — L820 Inflamed seborrheic keratosis: Secondary | ICD-10-CM | POA: Diagnosis not present

## 2016-12-18 ENCOUNTER — Telehealth: Payer: Self-pay | Admitting: *Deleted

## 2016-12-18 DIAGNOSIS — G4733 Obstructive sleep apnea (adult) (pediatric): Secondary | ICD-10-CM

## 2016-12-18 NOTE — Telephone Encounter (Signed)
Split night ordered 

## 2016-12-18 NOTE — Telephone Encounter (Signed)
-----   Message from Sueanne Margarita, MD sent at 12/15/2016  9:45 PM EST ----- Please let patient know that sleep study showed no significant sleep apnea but she had inadequate sleep to have an accurate study.  Please set up a repeat sleep study with sleep aide.  Please give Rx for Lunesta 2mg  (#1 tablet with no refills) to take on arrival at sleep lab.

## 2016-12-18 NOTE — Telephone Encounter (Signed)
Called the patient with her results and recommendations, she verbalized understanding and agreed 

## 2016-12-19 DIAGNOSIS — J301 Allergic rhinitis due to pollen: Secondary | ICD-10-CM | POA: Diagnosis not present

## 2016-12-19 DIAGNOSIS — J3089 Other allergic rhinitis: Secondary | ICD-10-CM | POA: Diagnosis not present

## 2016-12-31 ENCOUNTER — Telehealth: Payer: Self-pay | Admitting: Pulmonary Disease

## 2016-12-31 ENCOUNTER — Other Ambulatory Visit: Payer: Self-pay | Admitting: Gynecology

## 2016-12-31 DIAGNOSIS — M810 Age-related osteoporosis without current pathological fracture: Secondary | ICD-10-CM

## 2016-12-31 DIAGNOSIS — Z1231 Encounter for screening mammogram for malignant neoplasm of breast: Secondary | ICD-10-CM

## 2016-12-31 NOTE — Telephone Encounter (Signed)
Spoke with the pt  She states that she called to get a Mammogram scheduled and they noticed that she is due to bone density  She would like to have this done over at the breast center tomorrow 2/28 Please advise if okay to order, thanks

## 2017-01-01 ENCOUNTER — Ambulatory Visit
Admission: RE | Admit: 2017-01-01 | Discharge: 2017-01-01 | Disposition: A | Discharge status: Home / Self Care | Payer: PPO | Source: Ambulatory Visit | Attending: Gynecology | Admitting: Gynecology

## 2017-01-01 DIAGNOSIS — Z1231 Encounter for screening mammogram for malignant neoplasm of breast: Secondary | ICD-10-CM

## 2017-01-01 NOTE — Telephone Encounter (Signed)
Per SN---  Ok to order the bond density. thanks

## 2017-01-01 NOTE — Telephone Encounter (Signed)
Spoke with pt. She is aware that we will order this test. Pt requests to have it done at [facility]. Nothing further was needed.

## 2017-01-02 ENCOUNTER — Ambulatory Visit (INDEPENDENT_AMBULATORY_CARE_PROVIDER_SITE_OTHER)
Admission: RE | Admit: 2017-01-02 | Discharge: 2017-01-02 | Disposition: A | Discharge status: Home / Self Care | Payer: PPO | Source: Ambulatory Visit | Attending: Family Medicine | Admitting: Family Medicine

## 2017-01-02 DIAGNOSIS — M810 Age-related osteoporosis without current pathological fracture: Secondary | ICD-10-CM

## 2017-01-06 DIAGNOSIS — J301 Allergic rhinitis due to pollen: Secondary | ICD-10-CM | POA: Diagnosis not present

## 2017-01-06 DIAGNOSIS — J3089 Other allergic rhinitis: Secondary | ICD-10-CM | POA: Diagnosis not present

## 2017-01-13 ENCOUNTER — Other Ambulatory Visit: Payer: Self-pay | Admitting: Family Medicine

## 2017-01-16 ENCOUNTER — Encounter: Payer: Self-pay | Admitting: Cardiology

## 2017-01-17 ENCOUNTER — Other Ambulatory Visit: Payer: Self-pay | Admitting: Family Medicine

## 2017-01-18 ENCOUNTER — Other Ambulatory Visit: Payer: Self-pay | Admitting: Family Medicine

## 2017-01-20 ENCOUNTER — Other Ambulatory Visit: Payer: Self-pay | Admitting: Family Medicine

## 2017-01-20 DIAGNOSIS — J3089 Other allergic rhinitis: Secondary | ICD-10-CM | POA: Diagnosis not present

## 2017-01-20 DIAGNOSIS — J301 Allergic rhinitis due to pollen: Secondary | ICD-10-CM | POA: Diagnosis not present

## 2017-01-23 ENCOUNTER — Other Ambulatory Visit: Payer: PPO | Admitting: *Deleted

## 2017-01-23 ENCOUNTER — Telehealth (HOSPITAL_COMMUNITY): Payer: Self-pay | Admitting: *Deleted

## 2017-01-23 ENCOUNTER — Other Ambulatory Visit: Payer: Self-pay

## 2017-01-23 ENCOUNTER — Ambulatory Visit (HOSPITAL_COMMUNITY): Payer: PPO | Attending: Cardiovascular Disease

## 2017-01-23 DIAGNOSIS — I779 Disorder of arteries and arterioles, unspecified: Secondary | ICD-10-CM | POA: Diagnosis not present

## 2017-01-23 DIAGNOSIS — R011 Cardiac murmur, unspecified: Secondary | ICD-10-CM | POA: Diagnosis not present

## 2017-01-23 DIAGNOSIS — Z8249 Family history of ischemic heart disease and other diseases of the circulatory system: Secondary | ICD-10-CM | POA: Insufficient documentation

## 2017-01-23 DIAGNOSIS — R072 Precordial pain: Secondary | ICD-10-CM | POA: Diagnosis not present

## 2017-01-23 DIAGNOSIS — I10 Essential (primary) hypertension: Secondary | ICD-10-CM | POA: Insufficient documentation

## 2017-01-23 DIAGNOSIS — E7800 Pure hypercholesterolemia, unspecified: Secondary | ICD-10-CM

## 2017-01-23 DIAGNOSIS — I350 Nonrheumatic aortic (valve) stenosis: Secondary | ICD-10-CM | POA: Insufficient documentation

## 2017-01-23 DIAGNOSIS — J45909 Unspecified asthma, uncomplicated: Secondary | ICD-10-CM | POA: Diagnosis not present

## 2017-01-23 DIAGNOSIS — I872 Venous insufficiency (chronic) (peripheral): Secondary | ICD-10-CM | POA: Diagnosis not present

## 2017-01-23 DIAGNOSIS — I739 Peripheral vascular disease, unspecified: Secondary | ICD-10-CM

## 2017-01-23 LAB — COMPREHENSIVE METABOLIC PANEL
ALT: 17 IU/L (ref 0–32)
AST: 19 IU/L (ref 0–40)
Albumin/Globulin Ratio: 1.9 (ref 1.2–2.2)
Albumin: 4.2 g/dL (ref 3.5–4.8)
Alkaline Phosphatase: 51 IU/L (ref 39–117)
BUN/Creatinine Ratio: 24 (ref 12–28)
BUN: 17 mg/dL (ref 8–27)
Bilirubin Total: 0.2 mg/dL (ref 0.0–1.2)
CO2: 23 mmol/L (ref 18–29)
Calcium: 9.5 mg/dL (ref 8.7–10.3)
Chloride: 100 mmol/L (ref 96–106)
Creatinine, Ser: 0.7 mg/dL (ref 0.57–1.00)
GFR calc Af Amer: 97 mL/min/{1.73_m2} (ref >59)
GFR calc non Af Amer: 84 mL/min/{1.73_m2} (ref >59)
Globulin, Total: 2.2 g/dL (ref 1.5–4.5)
Glucose: 96 mg/dL (ref 65–99)
Potassium: 4.4 mmol/L (ref 3.5–5.2)
Sodium: 139 mmol/L (ref 134–144)
Total Protein: 6.4 g/dL (ref 6.0–8.5)

## 2017-01-23 LAB — LIPID PANEL
Chol/HDL Ratio: 2.5 ratio units (ref 0.0–4.4)
Cholesterol, Total: 200 mg/dL — ABNORMAL HIGH (ref 100–199)
HDL: 81 mg/dL (ref >39)
LDL Calculated: 112 mg/dL — ABNORMAL HIGH (ref 0–99)
Triglycerides: 34 mg/dL (ref 0–149)
VLDL Cholesterol Cal: 7 mg/dL (ref 5–40)

## 2017-01-23 NOTE — Telephone Encounter (Signed)
Left message on voicemail per DPR in reference to upcoming appointment scheduled on 01/27/17 with detailed instructions given per Myocardial Perfusion Study Information Sheet for the test. LM to arrive 15 minutes early, and that it is imperative to arrive on time for appointment to keep from having the test rescheduled. If you need to cancel or reschedule your appointment, please call the office within 24 hours of your appointment. Failure to do so may result in a cancellation of your appointment, and a $50 no show fee. Phone number given for call back for any questions.  Kirstie Peri

## 2017-01-27 ENCOUNTER — Encounter (INDEPENDENT_AMBULATORY_CARE_PROVIDER_SITE_OTHER): Payer: Self-pay

## 2017-01-27 ENCOUNTER — Ambulatory Visit (INDEPENDENT_AMBULATORY_CARE_PROVIDER_SITE_OTHER): Payer: PPO | Admitting: Cardiology

## 2017-01-27 ENCOUNTER — Ambulatory Visit (HOSPITAL_COMMUNITY): Payer: PPO | Attending: Cardiology

## 2017-01-27 ENCOUNTER — Encounter: Payer: Self-pay | Admitting: Cardiology

## 2017-01-27 VITALS — BP 124/60 | HR 72 | Ht 62.0 in | Wt 187.0 lb

## 2017-01-27 DIAGNOSIS — I779 Disorder of arteries and arterioles, unspecified: Secondary | ICD-10-CM | POA: Diagnosis not present

## 2017-01-27 DIAGNOSIS — I739 Peripheral vascular disease, unspecified: Secondary | ICD-10-CM

## 2017-01-27 DIAGNOSIS — R011 Cardiac murmur, unspecified: Secondary | ICD-10-CM | POA: Diagnosis not present

## 2017-01-27 DIAGNOSIS — E7800 Pure hypercholesterolemia, unspecified: Secondary | ICD-10-CM | POA: Diagnosis not present

## 2017-01-27 DIAGNOSIS — R072 Precordial pain: Secondary | ICD-10-CM | POA: Diagnosis not present

## 2017-01-27 DIAGNOSIS — R5383 Other fatigue: Secondary | ICD-10-CM | POA: Diagnosis not present

## 2017-01-27 DIAGNOSIS — I10 Essential (primary) hypertension: Secondary | ICD-10-CM | POA: Diagnosis not present

## 2017-01-27 DIAGNOSIS — Z8249 Family history of ischemic heart disease and other diseases of the circulatory system: Secondary | ICD-10-CM

## 2017-01-27 DIAGNOSIS — R0683 Snoring: Secondary | ICD-10-CM | POA: Diagnosis not present

## 2017-01-27 DIAGNOSIS — R252 Cramp and spasm: Secondary | ICD-10-CM | POA: Diagnosis not present

## 2017-01-27 LAB — MYOCARDIAL PERFUSION IMAGING
Estimated workload: 7 METS
Exercise duration (min): 5 min
LV dias vol: 89 mL (ref 46–106)
LV sys vol: 26 mL
MPHR: 141 {beats}/min
Peak HR: 141 {beats}/min
Percent HR: 99 %
RATE: 0.37
RPE: 18
Rest HR: 71 {beats}/min
SDS: 2
SRS: 0
SSS: 2
TID: 0.85

## 2017-01-27 MED ORDER — TECHNETIUM TC 99M TETROFOSMIN IV KIT
10.3000 | PACK | Freq: Once | INTRAVENOUS | Status: AC | PRN
  Administered 2017-01-27: 10.3 via INTRAVENOUS
  Filled 2017-01-27: qty 11

## 2017-01-27 MED ORDER — TECHNETIUM TC 99M TETROFOSMIN IV KIT
33.0000 | PACK | Freq: Once | INTRAVENOUS | Status: AC | PRN
  Administered 2017-01-27: 33 via INTRAVENOUS
  Filled 2017-01-27: qty 33

## 2017-01-27 NOTE — Patient Instructions (Signed)

## 2017-01-27 NOTE — Progress Notes (Signed)
Patient ID: Brianna Mccarty, female   DOB: 1941-09-14, 76 y.o.   MRN: [id number]      Cardiology Office Note   Date:  01/27/2017   ID:  Brianna Mccarty, DOB [birth year], MRN [id number]  PCP:  Brianna Mccarty., DO  Cardiologist:  Brianna Dawley, MD   Chief complain: Follow up for HTN, HLP   History of Present Illness: Brianna Mccarty is a 76 y.o. female who presents for follow up after 4 years. Former patient of Dr Brianna Mccarty. She has been doing well and remains active, she has been using a lot of supplements and asks advice about them. No chest pain, DOE, no palpitations, dizziness or syncope, no LE edema, orthopnea, PND. She stopped using simvastatin 2 months ago as she developed muscle cramps, those haven't resolved since then. Otherwise complaint with her meds.  11/13/2016 - patient is coming after one year, in the last year she has noticed worsening of her blood pressure and has been seen by our PAs and pharmacist on for occasion with uptitrating of her blood pressure medicine. She brings her blood pressure diary today with most of the blood pressure being controlled but several over 140. She states that she has been feeling profoundly tired, she links it to starting of hydralazine but it slowly getting better. She has had very stressful year, she previously had 4 family members dying of myocardial infarction in her brother died in 08/23/16 at age 62. Also her daughter has been dealing with depression. She has never had a stress test. She has been developing lower extremity cramping that didn't change with change of statins. She continues to take furosemide and denies lower extremity edema. She is no palpitations or syncope.  01/27/2017 - this is a 2 months follow-up with test results. Patient states that she overall feels great especially since she Couldn't use about her test results. She states that the reason why she is hypertensive is her daughter who is dealing with major depression disorder  with inability to take care of herself and her daughter and alcohol abuse. She is currently in the process of trying to bring her here to [facility] treatment. Patient has been compliant with her medications she denies any side effects. Denies any lower extremity edema orthopnea paroxysmal nocturnal dyspnea no chest pain or shortness of breath.  Past Medical History:  Diagnosis Date  . Abnormal EKG    Normal LV function in the past  . Allergic rhinitis   . Asthma   . Bronchitis, mucopurulent recurrent ([place])   . Carotid artery disease ([place])    Doppler, April, 2009, 0-39% bilateral  . Cervical dysplasia 1971  . Chest pain, unspecified   . Diverticulosis of colon   . DJD (degenerative joint disease)   . Ejection fraction    EF 60%, echo, 2009  . GERD (gastroesophageal reflux disease)   . Headache(784.0)   . Hypercholesterolemia   . Hypertension   . Interstitial cystitis    sees urologist  . Lichen sclerosus   . Malignant melanoma ([place])    sees Dr. Nevada Crane in dermatology  . Migraines   . Mitral valve disease    Question mitral valve prolapse in the past, no prolapse by echo 2009  . Murmur 10/20/2015  . Osteoporosis    on fosomax > 5 years, stopped 11/2015  . Renal calculus    sees urologist  . Thyroid cyst    1 x 1.1 thyroid cyst noted on carotid Doppler, January, 2012  .  Venous insufficiency     Past Surgical History:  Procedure Laterality Date  . BREAST BIOPSY Left 11/25/2011   U/S core, benign [facility] at [facility]  . BREAST CYST ASPIRATION    . BREAST SURGERY  2013   Breast Bx-Benign  . CARDIAC CATHETERIZATION    . CATARACT EXTRACTION, BILATERAL    . cystoscopy and basket stone removal right ureter  02/2006   Dr. Terance Hart  . GYNECOLOGIC CRYOSURGERY  1971  . left total hip replacement  2004   Dr. Gladstone Lighter  . melanoma removed from medial rleft knee area  2006   Dr. Nevada Crane  . NASAL SEPTUM SURGERY       Current Outpatient Prescriptions  Medication Sig Dispense Refill  .  acyclovir (ZOVIRAX) 400 MG tablet 400mg  tid x5 days for outbreak 15 tablet 3  . albuterol (PROAIR HFA) 108 (90 BASE) MCG/ACT inhaler Inhale 2 puffs into the lungs every 6 (six) hours as needed for wheezing. 3 Inhaler 0  . albuterol (PROVENTIL) (2.5 MG/3ML) 0.083% nebulizer solution Take 3 mLs (2.5 mg total) by nebulization every 6 (six) hours as needed. 360 mL 0  . amLODipine (NORVASC) 10 MG tablet TAKE 1 TABLET BY MOUTH EVERY DAY 90 tablet 1  . Ascorbic Acid (VITAMIN C) 500 MG tablet Take 500 mg by mouth daily.      Marland Kitchen aspirin 81 MG tablet Take 81 mg by mouth daily.      Marland Kitchen azelastine (ASTELIN) 0.1 % nasal spray USE 2 SPRAYS IN EACH NOSTRIL TWICE A DAY, AS NEEDED FOR NASAL ALLERGY SYMPTOMS  5  . benzonatate (TESSALON) 100 MG capsule Take 1-2 capsules (100-200 mg total) by mouth 3 (three) times daily as needed for cough. 24 capsule 0  . budesonide-formoterol (SYMBICORT) 160-4.5 MCG/ACT inhaler Inhale 2 puffs into the lungs 2 (two) times daily. 3 Inhaler 3  . Calcium Carbonate (CALCIUM 600) 1500 MG TABS Take 1 tablet by mouth daily.      . Cholecalciferol (VITAMIN D3) 5000 UNITS CAPS Take 1 capsule by mouth daily.      . Cinnamon 500 MG TABS Take by mouth. With Chromium    . clobetasol cream (TEMOVATE) 9.93 % Apply 1 application topically at bedtime as needed. 30 g 1  . Coenzyme Q10 (COQ10) 100 MG CAPS Take 1 capsule by mouth daily.      Marland Kitchen desmopressin (DDAVP) 0.2 MG tablet Take 3 tablets by mouth daily at bedtime     . fexofenadine (ALLEGRA) 60 MG tablet Take 60 mg by mouth daily.    . hydrALAZINE (APRESOLINE) 50 MG tablet Take 1 tablet (50 mg total) by mouth 3 (three) times daily. 90 tablet 11  . hydrochlorothiazide (HYDRODIURIL) 25 MG tablet Take 1 tablet (25 mg total) by mouth daily. 90 tablet 3  . hydrOXYzine (ATARAX/VISTARIL) 10 MG tablet Take 10 mg by mouth daily.    Marland Kitchen losartan (COZAAR) 100 MG tablet TAKE 1 TABLET (100 MG TOTAL) BY MOUTH DAILY. 90 tablet 1  . magnesium oxide (MAG-OX) 400 MG  tablet Take 400 mg by mouth daily.      . Multiple Vitamin (MULTIVITAMIN) capsule Take 1 capsule by mouth daily.      Marland Kitchen omeprazole (PRILOSEC) 40 MG capsule TAKE 1 CAPSULE (40 MG TOTAL) BY MOUTH DAILY. 90 capsule 1  . OVER THE COUNTER MEDICATION Bone growth factor    . OVER THE COUNTER MEDICATION 3 oz daily. Nopolea    . OVER THE COUNTER MEDICATION Ocuvite 50 +    .  OVER THE COUNTER MEDICATION Juice plus    . OVER THE COUNTER MEDICATION Occuvite 50+ eye vitamin    . pentosan polysulfate (ELMIRON) 100 MG capsule Take 100 mg by mouth 3 (three) times daily before meals.      . prazosin (MINIPRESS) 5 MG capsule TAKE ONE CAPSULE BY MOUTH TWICE A DAY 180 capsule 1  . PRESCRIPTION MEDICATION Allergy shots every other week    . Probiotic Product (PROBIOTIC PO) 1 daily     No current facility-administered medications for this visit.    Allergies:   Cephalexin; Penicillins; Phenobarbital; and Tape   Social History:  The patient  reports that she has never smoked. She has never used smokeless tobacco. She reports that she drinks about 0.6 oz of alcohol per week . She reports that she does not use drugs.   Family History:  The patient's family history includes Cancer in her brother and father; Colon cancer in her paternal uncle; Diabetes in her brother, brother, and mother; Heart disease in her brother and mother; Hypertension in her brother, brother, sister, and sister.   ROS:  Please see the history of present illness.  All other systems are reviewed and negative.   PHYSICAL EXAM: VS:  BP 124/60   Pulse 72   Ht 5\' 2"  (1.575 m)   Wt 187 lb (84.8 kg)   BMI 34.20 kg/m  , BMI Body mass index is 34.2 kg/m. GEN: Well nourished, well developed, in no acute distress  HEENT: normal  Neck: no JVD, carotid bruits, or masses Cardiac: RRR; 3/6 murmurs, rubs, or gallops,no edema  Respiratory:  clear to auscultation bilaterally, normal work of breathing GI: soft, nontender, nondistended, + BS MS: no  deformity or atrophy  Skin: warm and dry, no rash Neuro:  Strength and sensation are intact Psych: euthymic mood, full affect  EKG:  SRlow voltage ECG, LAFB, unchanged from prior ECG.  Recent Labs: 08/08/2016: Hemoglobin 12.4 11/13/2016: Magnesium 1.9; Platelets 257; TSH 3.510 01/23/2017: ALT 17; BUN 17; Creatinine, Ser 0.70; Potassium 4.4; Sodium 139  Lipid Panel    Component Value Date/Time   CHOL 200 (H) 01/23/2017 1159   TRIG 34 01/23/2017 1159   HDL 81 01/23/2017 1159   CHOLHDL 2.5 01/23/2017 1159   CHOLHDL 1.9 10/20/2015 1120   VLDL 9 10/20/2015 1120   LDLCALC 112 (H) 01/23/2017 1159   LDLDIRECT 126.6 07/18/2010 1045   Wt Readings from Last 3 Encounters:  01/27/17 187 lb (84.8 kg)  01/27/17 190 lb (86.2 kg)  12/13/16 190 lb (86.2 kg)    TTE: 01/2017 - Left ventricle: The cavity size was normal. Systolic function was   normal. The estimated ejection fraction was in the range of 55%   to 60%. Wall motion was normal; there were no regional wall   motion abnormalities. Features are consistent with a pseudonormal   left ventricular filling pattern, with concomitant abnormal   relaxation and increased filling pressure (grade 2 diastolic   dysfunction). - Aortic valve: There was very mild stenosis. - Mitral valve: Calcified annulus. - Left atrium: The atrium was severely dilated.     ASSESSMENT AND PLAN:  1. Hypertension - controlled, EKG and echo showed no signs of LVH, also on her stress test she had normal blood pressure response to stress, since that her stressor is psychological, we will continue the same management.   2. Fatigue and significant family history of premature coronary artery disease, stress test showed no evidence of prior infarct or ischemia.  3. Hyperlipidemia - she is on pravastatin, her muscle pain has improved, she is tolerating pravastatin well.   4. Carotid disease - minimal plaque on carotid US in 05/2015, follow up in 2 years. She has no bruit  now.  5. Systolic murmur - echo in January 2018 showed mild aortic stenosis.  6. Snoring and fatigue - we will reschedule a sleep study for May 2018.  Follow up in 6 months.   Signed, Brianna Dawley, MD  01/27/2017 3:00 PM    [place] [address], [place], [place]  [address] Phone: [phone]; Fax: [phone]   Follow-Up:  Your physician wants you to follow-up in: In 6 months, You will receive a reminder letter in the mail two months in advance. If you don't receive a letter, please call our office to schedule the follow-up appointment.

## 2017-02-04 ENCOUNTER — Encounter (HOSPITAL_BASED_OUTPATIENT_CLINIC_OR_DEPARTMENT_OTHER): Payer: PPO

## 2017-02-13 DIAGNOSIS — J3089 Other allergic rhinitis: Secondary | ICD-10-CM | POA: Diagnosis not present

## 2017-02-13 DIAGNOSIS — J301 Allergic rhinitis due to pollen: Secondary | ICD-10-CM | POA: Diagnosis not present

## 2017-02-27 DIAGNOSIS — J3089 Other allergic rhinitis: Secondary | ICD-10-CM | POA: Diagnosis not present

## 2017-02-27 DIAGNOSIS — J301 Allergic rhinitis due to pollen: Secondary | ICD-10-CM | POA: Diagnosis not present

## 2017-03-03 DIAGNOSIS — H40013 Open angle with borderline findings, low risk, bilateral: Secondary | ICD-10-CM | POA: Diagnosis not present

## 2017-03-03 DIAGNOSIS — H26491 Other secondary cataract, right eye: Secondary | ICD-10-CM | POA: Diagnosis not present

## 2017-03-03 DIAGNOSIS — H35341 Macular cyst, hole, or pseudohole, right eye: Secondary | ICD-10-CM | POA: Diagnosis not present

## 2017-03-03 DIAGNOSIS — Z961 Presence of intraocular lens: Secondary | ICD-10-CM | POA: Diagnosis not present

## 2017-03-04 ENCOUNTER — Encounter: Payer: Self-pay | Admitting: Family Medicine

## 2017-03-06 NOTE — Progress Notes (Signed)
HPI:  Brianna Mccarty is a pleasant 76 y.o. here for follow up. Chronic medical problems summarized below were reviewed for changes and stability and were updated as needed below. These issues and their treatment remain stable for the most part. Depression and anxiety resolved without CBT as daughter's situation improved. Had labs and visit with her cardiologist recently. Occ has some arthritic pain in the hands and this is relieved with rare use of OTC analgesics. Denies CP, SOB, DOE, treatment intolerance or new symptoms.  Anxiety and Depression: -much better as daughter is now doing better  HTN/Carotid Art Dz/HLD/venous insufficiency: -sees cardiologist -meds: norvasc, hydralazine, hctz, losartan, prazosin, asa   Asthma/Allergies: -meds: alb prn, azelastine nasal, Symbicort, allegra  GERD: -meds: prilosec 40mg   Osteoporosis: -on fosamax in the past, no on holiday per her preference since 2017  Melanoma: -sees dermatologist for management  IC: -sees urologist for management -meds: desmopressin ROS: See pertinent positives and negatives per HPI.  Past Medical History:  Diagnosis Date  . Abnormal EKG    Normal LV function in the past  . Allergic rhinitis   . Asthma   . Bronchitis, mucopurulent recurrent ([place])   . Carotid artery disease ([place])    Doppler, April, 2009, 0-39% bilateral  . Cervical dysplasia 1971  . Chest pain, unspecified   . Diverticulosis of colon   . DJD (degenerative joint disease)   . Ejection fraction    EF 60%, echo, 2009  . GERD (gastroesophageal reflux disease)   . Headache(784.0)   . Hypercholesterolemia   . Hypertension   . Interstitial cystitis    sees urologist  . Lichen sclerosus   . Malignant melanoma ([place])    sees Dr. Nevada Crane in dermatology  . Migraines   . Mitral valve disease    Question mitral valve prolapse in the past, no prolapse by echo 2009  . Murmur 10/20/2015  . Osteoporosis    on fosomax > 5 years, stopped 11/2015  .  Renal calculus    sees urologist  . Thyroid cyst    1 x 1.1 thyroid cyst noted on carotid Doppler, January, 2012  . Venous insufficiency     Past Surgical History:  Procedure Laterality Date  . BREAST BIOPSY Left 11/25/2011   U/S core, benign performed at [facility]  . BREAST CYST ASPIRATION    . BREAST SURGERY  2013   Breast Bx-Benign  . CARDIAC CATHETERIZATION    . CATARACT EXTRACTION, BILATERAL    . cystoscopy and basket stone removal right ureter  02/2006   Dr. Terance Hart  . GYNECOLOGIC CRYOSURGERY  1971  . left total hip replacement  2004   Dr. Gladstone Lighter  . melanoma removed from medial rleft knee area  2006   Dr. Nevada Crane  . NASAL SEPTUM SURGERY      Family History  Problem Relation Age of Onset  . Cancer Father     Pancreatic  . Diabetes Mother   . Heart disease Mother   . Cancer Brother     Bile duct  . Diabetes Brother   . Hypertension Brother   . Diabetes Brother   . Heart disease Brother   . Hypertension Brother   . Hypertension Sister   . Hypertension Sister   . Colon cancer Paternal Uncle     Social History   Social History  . Marital status: Married    Spouse name: Edison Nasuti  . Number of children: 2  . Years of education: N/A   Occupational History  .  retired Retired   Social History Main Topics  . Smoking status: Never Smoker  . Smokeless tobacco: Never Used  . Alcohol use 0.6 oz/week    1 Standard drinks or equivalent per week     Comment: social use  . Drug use: No  . Sexual activity: No     Comment: 1st intercourse 49 yo-1 partner   Other Topics Concern  . None   Social History Narrative   Updated 11/2015   Work or School: none      Home Situation: lives with husband      Spiritual Beliefs: christian      Lifestyle: regular exercise; healthy diet           Current Outpatient Prescriptions:  .  acyclovir (ZOVIRAX) 400 MG tablet, 400mg  tid x5 days for outbreak, Disp: 15 tablet, Rfl: 3 .  albuterol (PROAIR HFA) 108 (90 BASE) MCG/ACT  inhaler, Inhale 2 puffs into the lungs every 6 (six) hours as needed for wheezing., Disp: 3 Inhaler, Rfl: 0 .  amLODipine (NORVASC) 10 MG tablet, TAKE 1 TABLET BY MOUTH EVERY DAY, Disp: 90 tablet, Rfl: 1 .  Ascorbic Acid (VITAMIN C) 500 MG tablet, Take 500 mg by mouth daily.  , Disp: , Rfl:  .  aspirin 81 MG tablet, Take 81 mg by mouth daily.  , Disp: , Rfl:  .  azelastine (ASTELIN) 0.1 % nasal spray, USE 2 SPRAYS IN EACH NOSTRIL TWICE A DAY, AS NEEDED FOR NASAL ALLERGY SYMPTOMS, Disp: , Rfl: 5 .  budesonide-formoterol (SYMBICORT) 160-4.5 MCG/ACT inhaler, Inhale 2 puffs into the lungs 2 (two) times daily., Disp: 3 Inhaler, Rfl: 3 .  Calcium Carbonate (CALCIUM 600) 1500 MG TABS, Take 1 tablet by mouth daily.  , Disp: , Rfl:  .  Cholecalciferol (VITAMIN D3) 5000 UNITS CAPS, Take 1 capsule by mouth daily.  , Disp: , Rfl:  .  Cinnamon 500 MG TABS, Take by mouth. With Chromium, Disp: , Rfl:  .  clobetasol cream (TEMOVATE) 8.75 %, Apply 1 application topically at bedtime as needed., Disp: 30 g, Rfl: 1 .  Coenzyme Q10 (COQ10) 100 MG CAPS, Take 1 capsule by mouth daily.  , Disp: , Rfl:  .  desmopressin (DDAVP) 0.2 MG tablet, Take 3 tablets by mouth daily at bedtime , Disp: , Rfl:  .  fexofenadine (ALLEGRA) 60 MG tablet, Take 60 mg by mouth daily., Disp: , Rfl:  .  hydrALAZINE (APRESOLINE) 50 MG tablet, Take 1 tablet (50 mg total) by mouth 3 (three) times daily., Disp: 90 tablet, Rfl: 11 .  hydrOXYzine (ATARAX/VISTARIL) 10 MG tablet, Take 10 mg by mouth daily., Disp: , Rfl:  .  losartan (COZAAR) 100 MG tablet, TAKE 1 TABLET (100 MG TOTAL) BY MOUTH DAILY., Disp: 90 tablet, Rfl: 1 .  magnesium oxide (MAG-OX) 400 MG tablet, Take 400 mg by mouth daily.  , Disp: , Rfl:  .  Multiple Vitamin (MULTIVITAMIN) capsule, Take 1 capsule by mouth daily.  , Disp: , Rfl:  .  Naproxen Sodium (ALEVE PO), Take by mouth as needed. For back and shoulder pain, Disp: , Rfl:  .  omeprazole (PRILOSEC) 40 MG capsule, TAKE 1 CAPSULE  (40 MG TOTAL) BY MOUTH DAILY., Disp: 90 capsule, Rfl: 1 .  OVER THE COUNTER MEDICATION, Bone growth factor, Disp: , Rfl:  .  OVER THE COUNTER MEDICATION, 3 oz daily. Nopolea, Disp: , Rfl:  .  OVER THE COUNTER MEDICATION, Ocuvite 50 +, Disp: , Rfl:  .  OVER THE COUNTER MEDICATION, Juice plus, Disp: , Rfl:  .  OVER THE COUNTER MEDICATION, Occuvite 50+ eye vitamin, Disp: , Rfl:  .  pentosan polysulfate (ELMIRON) 100 MG capsule, Take 100 mg by mouth 3 (three) times daily before meals.  , Disp: , Rfl:  .  prazosin (MINIPRESS) 5 MG capsule, TAKE ONE CAPSULE BY MOUTH TWICE A DAY, Disp: 180 capsule, Rfl: 1 .  PRESCRIPTION MEDICATION, Allergy shots every other week, Disp: , Rfl:  .  Probiotic Product (PROBIOTIC PO), 1 daily, Disp: , Rfl:  .  albuterol (PROVENTIL) (2.5 MG/3ML) 0.083% nebulizer solution, Take 3 mLs (2.5 mg total) by nebulization every 6 (six) hours as needed., Disp: 360 mL, Rfl: 0 .  hydrochlorothiazide (HYDRODIURIL) 25 MG tablet, Take 1 tablet (25 mg total) by mouth daily., Disp: 90 tablet, Rfl: 3  EXAM:  Vitals:   03/07/17 1258  BP: (!) 110/58  Pulse: 67  Temp: 97.9 F (36.6 C)    Body mass index is 34.48 kg/m.  GENERAL: vitals reviewed and listed above, alert, oriented, appears well hydrated and in no acute distress  HEENT: atraumatic, conjunttiva clear, no obvious abnormalities on inspection of external nose and ears  NECK: no obvious masses on inspection  LUNGS: clear to auscultation bilaterally, no wheezes, rales or rhonchi, good air movement  CV: HRRR, no peripheral edema  MS: moves all extremities without noticeable abnormality  PSYCH: pleasant and cooperative, no obvious depression or anxiety  ASSESSMENT AND PLAN:  Discussed the following assessment and plan:  Essential hypertension  HYPERCHOLESTEROLEMIA  Gastroesophageal reflux disease without esophagitis  Snoring  -lifestyle recs -she reports sleep study is being scheduled -discussed topical  options for OA pain as she prefers to avoid oral medications as much as possible -continue current medications -advise medication exam with Manuela Schwartz, follow up with me in 4 months -Patient advised to return or notify a doctor immediately if symptoms worsen or persist or new concerns arise.  Patient Instructions  BEFORE YOU LEAVE: -follow up: Medicare exam with Manuela Schwartz at her convenience Follow up with Dr. Maudie Mercury in September  We recommend the following healthy lifestyle for LIFE: 1) Small portions.   Tip: eat off of a salad plate instead of a dinner plate.  Tip: if you need more or a snack choose fruits, veggies and/or a handful of nuts or seeds.  2) Eat a healthy clean diet.  * Tip: Avoid (less then 1 serving per week): processed foods, sweets, sweetened drinks, white starches (rice, flour, bread, potatoes, pasta, etc), red meat, fast foods, butter  *Tip: CHOOSE instead   * 5-9 servings per day of fresh or frozen fruits and vegetables (but not corn, potatoes, bananas, canned or dried fruit)   *nuts and seeds, beans   *olives and olive oil   *small portions of lean meats such as fish and white chicken    *small portions of whole grains  3)Get at least 150 minutes of sweaty aerobic exercise per week.  4)Reduce stress - consider counseling, meditation and relaxation to balance other aspects of your life.    Colin Benton R., DO

## 2017-03-07 ENCOUNTER — Encounter: Payer: Self-pay | Admitting: Family Medicine

## 2017-03-07 ENCOUNTER — Ambulatory Visit (INDEPENDENT_AMBULATORY_CARE_PROVIDER_SITE_OTHER): Payer: PPO | Admitting: Family Medicine

## 2017-03-07 VITALS — BP 110/58 | HR 67 | Temp 97.9°F | Ht 62.0 in | Wt 188.5 lb

## 2017-03-07 DIAGNOSIS — I10 Essential (primary) hypertension: Secondary | ICD-10-CM

## 2017-03-07 DIAGNOSIS — K219 Gastro-esophageal reflux disease without esophagitis: Secondary | ICD-10-CM

## 2017-03-07 DIAGNOSIS — E7800 Pure hypercholesterolemia, unspecified: Secondary | ICD-10-CM | POA: Diagnosis not present

## 2017-03-07 DIAGNOSIS — R0683 Snoring: Secondary | ICD-10-CM | POA: Diagnosis not present

## 2017-03-07 NOTE — Progress Notes (Signed)
Pre visit review using our clinic review tool, if applicable. No additional management support is needed unless otherwise documented below in the visit note. 

## 2017-03-07 NOTE — Patient Instructions (Addendum)
BEFORE YOU LEAVE: -follow up: Medicare exam with Manuela Schwartz at her convenience Follow up with Dr. Maudie Mercury in September  We recommend the following healthy lifestyle for LIFE: 1) Small portions.   Tip: eat off of a salad plate instead of a dinner plate.  Tip: if you need more or a snack choose fruits, veggies and/or a handful of nuts or seeds.  2) Eat a healthy clean diet.  * Tip: Avoid (less then 1 serving per week): processed foods, sweets, sweetened drinks, white starches (rice, flour, bread, potatoes, pasta, etc), red meat, fast foods, butter  *Tip: CHOOSE instead   * 5-9 servings per day of fresh or frozen fruits and vegetables (but not corn, potatoes, bananas, canned or dried fruit)   *nuts and seeds, beans   *olives and olive oil   *small portions of lean meats such as fish and white chicken    *small portions of whole grains  3)Get at least 150 minutes of sweaty aerobic exercise per week.  4)Reduce stress - consider counseling, meditation and relaxation to balance other aspects of your life.

## 2017-03-12 DIAGNOSIS — J301 Allergic rhinitis due to pollen: Secondary | ICD-10-CM | POA: Diagnosis not present

## 2017-03-12 DIAGNOSIS — J3089 Other allergic rhinitis: Secondary | ICD-10-CM | POA: Diagnosis not present

## 2017-03-14 DIAGNOSIS — M75111 Incomplete rotator cuff tear or rupture of right shoulder, not specified as traumatic: Secondary | ICD-10-CM | POA: Diagnosis not present

## 2017-03-14 DIAGNOSIS — M48061 Spinal stenosis, lumbar region without neurogenic claudication: Secondary | ICD-10-CM | POA: Diagnosis not present

## 2017-03-14 DIAGNOSIS — M5416 Radiculopathy, lumbar region: Secondary | ICD-10-CM | POA: Diagnosis not present

## 2017-03-19 DIAGNOSIS — H6123 Impacted cerumen, bilateral: Secondary | ICD-10-CM | POA: Diagnosis not present

## 2017-03-20 ENCOUNTER — Encounter (HOSPITAL_BASED_OUTPATIENT_CLINIC_OR_DEPARTMENT_OTHER): Payer: PPO

## 2017-04-01 DIAGNOSIS — J3089 Other allergic rhinitis: Secondary | ICD-10-CM | POA: Diagnosis not present

## 2017-04-01 DIAGNOSIS — J301 Allergic rhinitis due to pollen: Secondary | ICD-10-CM | POA: Diagnosis not present

## 2017-04-01 DIAGNOSIS — H26491 Other secondary cataract, right eye: Secondary | ICD-10-CM | POA: Diagnosis not present

## 2017-04-08 DIAGNOSIS — J3089 Other allergic rhinitis: Secondary | ICD-10-CM | POA: Diagnosis not present

## 2017-04-08 DIAGNOSIS — J4541 Moderate persistent asthma with (acute) exacerbation: Secondary | ICD-10-CM | POA: Diagnosis not present

## 2017-04-08 DIAGNOSIS — H1045 Other chronic allergic conjunctivitis: Secondary | ICD-10-CM | POA: Diagnosis not present

## 2017-04-08 DIAGNOSIS — J301 Allergic rhinitis due to pollen: Secondary | ICD-10-CM | POA: Diagnosis not present

## 2017-04-11 ENCOUNTER — Other Ambulatory Visit: Payer: Self-pay | Admitting: Family Medicine

## 2017-04-11 ENCOUNTER — Ambulatory Visit (HOSPITAL_COMMUNITY)
Admission: EM | Admit: 2017-04-11 | Discharge: 2017-04-11 | Disposition: A | Discharge status: Home / Self Care | Payer: PPO | Attending: Physician Assistant | Admitting: Physician Assistant

## 2017-04-11 ENCOUNTER — Ambulatory Visit (INDEPENDENT_AMBULATORY_CARE_PROVIDER_SITE_OTHER): Payer: PPO

## 2017-04-11 ENCOUNTER — Encounter (HOSPITAL_COMMUNITY): Payer: Self-pay | Admitting: *Deleted

## 2017-04-11 DIAGNOSIS — R059 Cough, unspecified: Secondary | ICD-10-CM

## 2017-04-11 DIAGNOSIS — I10 Essential (primary) hypertension: Secondary | ICD-10-CM

## 2017-04-11 DIAGNOSIS — R05 Cough: Secondary | ICD-10-CM

## 2017-04-11 DIAGNOSIS — J4521 Mild intermittent asthma with (acute) exacerbation: Secondary | ICD-10-CM

## 2017-04-11 MED ORDER — HYDROCODONE-HOMATROPINE 5-1.5 MG/5ML PO SYRP: 5.0000 mL | ORAL_SOLUTION | Freq: Four times a day (QID) | ORAL | 0 refills | Status: DC | PRN

## 2017-04-11 NOTE — ED Provider Notes (Signed)
CSN: [id number]     Arrival date & time 04/11/17  1807 History   First MD Initiated Contact with Patient 04/11/17 [place]     Chief Complaint  Patient presents with  . Cough   (Consider location/radiation/quality/duration/timing/severity/associated sxs/prior Treatment)  76 yo presents with an ongoing cough. She was evaluated by her PCP on Tuesday and diagnosed with asthma exacerbation. She was treated with inhalers and prednisone, now on 30mg /day. Yesterday she noted that her cough was more frequent and ongoing and she was concerned. She denies dyspnea or sputum. She has some fatigue but no fevers or chills. See remainder of ROS      Past Medical History:  Diagnosis Date  . Abnormal EKG    Normal LV function in the past  . Allergic rhinitis   . Asthma   . Bronchitis, mucopurulent recurrent ([place])   . Carotid artery disease ([place])    Doppler, April, 2009, 0-39% bilateral  . Cervical dysplasia 1971  . Chest pain, unspecified   . Diverticulosis of colon   . DJD (degenerative joint disease)   . Ejection fraction    EF 60%, echo, 2009  . GERD (gastroesophageal reflux disease)   . Headache(784.0)   . Hypercholesterolemia   . Hypertension   . Interstitial cystitis    sees urologist  . Lichen sclerosus   . Malignant melanoma ([place])    sees Dr. Nevada Crane in dermatology  . Migraines   . Mitral valve disease    Question mitral valve prolapse in the past, no prolapse by echo 2009  . Murmur 10/20/2015  . Osteoporosis    on fosomax > 5 years, stopped 11/2015  . Renal calculus    sees urologist  . Thyroid cyst    1 x 1.1 thyroid cyst noted on carotid Doppler, January, 2012  . Venous insufficiency    Past Surgical History:  Procedure Laterality Date  . BREAST BIOPSY Left 11/25/2011   [facility]/S core[facility] benign [facility]  . BREAST CYST ASPIRATION    . BREAST SURGERY  2013   Breast Bx-Benign  . CARDIAC CATHETERIZATION    . CATARACT EXTRACTION, BILATERAL    . cystoscopy and basket stone  removal right ureter  02/2006   Dr. Terance Hart  . GYNECOLOGIC CRYOSURGERY  1971  . left total hip replacement  2004   Dr. Gladstone Lighter  . melanoma removed from medial rleft knee area  2006   Dr. Nevada Crane  . NASAL SEPTUM SURGERY     Family History  Problem Relation Age of Onset  . Cancer Father        Pancreatic  . Diabetes Mother   . Heart disease Mother   . Cancer Brother        Bile duct  . Diabetes Brother   . Hypertension Brother   . Diabetes Brother   . Heart disease Brother   . Hypertension Brother   . Hypertension Sister   . Hypertension Sister   . Colon cancer Paternal Uncle    Social History  Substance Use Topics  . Smoking status: Never Smoker  . Smokeless tobacco: Never Used  . Alcohol use 0.6 oz/week    1 Standard drinks or equivalent per week     Comment: social use   OB History    Gravida Para Term Preterm AB Living   2 2 2     2    SAB TAB Ectopic Multiple Live Births  Review of Systems  Constitutional: Positive for fatigue. Negative for chills and fever.  HENT: Negative.   Respiratory: Positive for cough. Negative for chest tightness, shortness of breath and wheezing.   Cardiovascular: Negative for chest pain.  Allergic/Immunologic: Positive for environmental allergies.  Psychiatric/Behavioral: Negative.     Allergies  Cephalexin; Penicillins; Phenobarbital; and Tape  Home Medications   Prior to Admission medications   Medication Sig Start Date End Date Taking? Authorizing Provider  acyclovir (ZOVIRAX) 400 MG tablet 400mg  tid x5 days for outbreak 11/27/15   Lucretia Kern, DO  albuterol [facility] HFA) 108 (90 BASE) MCG/ACT inhaler Inhale 2 puffs into the lungs every 6 (six) hours as needed for wheezing. 06/23/15   Noralee Space, MD  albuterol (PROVENTIL) (2.5 MG/3ML) 0.083% nebulizer solution Take 3 mLs (2.5 mg total) by nebulization every 6 (six) hours as needed. 06/23/15 01/31/17  Noralee Space, MD  amLODipine (NORVASC) 10 MG tablet TAKE 1  TABLET BY MOUTH EVERY DAY 01/20/17   Lucretia Kern, DO  Ascorbic Acid (VITAMIN C) 500 MG tablet Take 500 mg by mouth daily.      [provider]  aspirin 81 MG tablet Take 81 mg by mouth daily.      [provider]  azelastine (ASTELIN) 0.1 % nasal spray USE 2 SPRAYS IN EACH NOSTRIL TWICE A DAY, AS NEEDED FOR NASAL ALLERGY SYMPTOMS 11/19/16   [provider]  budesonide-formoterol (SYMBICORT) 160-4.5 MCG/ACT inhaler Inhale 2 puffs into the lungs 2 (two) times daily. 11/27/15   Lucretia Kern, DO  Calcium Carbonate (CALCIUM 600) 1500 MG TABS Take 1 tablet by mouth daily.      [provider]  Cholecalciferol (VITAMIN D3) 5000 UNITS CAPS Take 1 capsule by mouth daily.      [provider]  Cinnamon 500 MG TABS Take by mouth. With Chromium    [provider]  clobetasol cream (TEMOVATE) 2.99 % Apply 1 application topically at bedtime as needed. 07/03/15   Fontaine, Belinda Block, MD  Coenzyme Q10 (COQ10) 100 MG CAPS Take 1 capsule by mouth daily.      [provider]  desmopressin (DDAVP) 0.2 MG tablet Take 3 tablets by mouth daily at bedtime     [provider]  fexofenadine (ALLEGRA) 60 MG tablet Take 60 mg by mouth daily.    [provider]  hydrALAZINE (APRESOLINE) 50 MG tablet Take 1 tablet (50 mg total) by mouth 3 (three) times daily. 10/22/16   Dorothy Spark, MD  hydrochlorothiazide (HYDRODIURIL) 25 MG tablet Take 1 tablet (25 mg total) by mouth daily. 11/13/16 02/11/17  Dorothy Spark, MD  HYDROcodone-homatropine [facility]) 5-1.5 MG/5ML syrup Take 5 mLs by mouth every 6 (six) hours as needed for cough. 04/11/17   Bjorn Pippin, PA-C  hydrOXYzine (ATARAX/VISTARIL) 10 MG tablet Take 10 mg by mouth daily.    [provider]  losartan (COZAAR) 100 MG tablet TAKE 1 TABLET (100 MG TOTAL) BY MOUTH DAILY. 01/20/17   Lucretia Kern, DO  magnesium oxide (MAG-OX) 400 MG tablet Take 400 mg by mouth daily.      [provider]  Multiple Vitamin (MULTIVITAMIN) capsule Take 1 capsule by mouth daily.      [provider]  Naproxen Sodium (ALEVE PO) Take by mouth as needed. For back and shoulder pain    [provider]  omeprazole (PRILOSEC) 40 MG capsule TAKE 1 CAPSULE (40 MG TOTAL) BY MOUTH DAILY. 04/11/17  Lucretia Kern, DO  OVER THE COUNTER MEDICATION Bone growth factor    [provider]  OVER THE COUNTER MEDICATION 3 oz daily. Nopolea    [provider]  OVER THE COUNTER MEDICATION Ocuvite 48 +    [provider]  OVER THE COUNTER MEDICATION Juice plus    [provider]  OVER THE COUNTER MEDICATION Occuvite 50+ eye vitamin    [provider]  pentosan polysulfate (ELMIRON) 100 MG capsule Take 100 mg by mouth 3 (three) times daily before meals.      [provider]  prazosin (MINIPRESS) 5 MG capsule TAKE ONE CAPSULE BY MOUTH TWICE A DAY 01/20/17   Lucretia Kern, DO  PRESCRIPTION MEDICATION Allergy shots every other week    [provider]  Probiotic Product (PROBIOTIC PO) 1 daily    [provider]   Meds Ordered and Administered this Visit  Medications - No data to display  BP (!) 168/77 (BP Location: Right Arm)   Pulse 78   Temp 98.6 F (37 C) (Oral)   Resp 18   SpO2 98%  No data found.   Physical Exam  Constitutional: She is oriented to person, place, and time. She appears well-developed and well-nourished. No distress.  Resting comfortable on exam table in NAD  HENT:  Head: Normocephalic and atraumatic.  Cardiovascular: Normal rate and regular rhythm.   Pulmonary/Chest: Effort normal.  Mild fine wheeze and rhonci to right base, otherwise moving air without wheeze or crackles  Lymphadenopathy:    She has no cervical adenopathy.  Neurological: She is alert and oriented to person, place, and time.  Skin: Skin is warm and dry. She is not diaphoretic.  Psychiatric: Her behavior is normal.  Nursing  note and vitals reviewed.   Urgent Care Course     Procedures (including critical care time)  Labs Review Labs Reviewed - No data to display  Imaging Review Dg Chest 2 View  Result Date: 04/11/2017 CLINICAL DATA:  Cough and chest tightness. EXAM: CHEST  2 VIEW COMPARISON:  PA and lateral chest 05/18/2010. FINDINGS: The lungs are clear. Heart size is normal. No pneumothorax or pleural effusion. No bony abnormality. IMPRESSION: Negative chest. Electronically Signed   By: Inge Rise M.D.   On: 04/11/2017 19:14     Visual Acuity Review  Right Eye Distance:   Left Eye Distance:   Bilateral Distance:    Right Eye Near:   Left Eye Near:    Bilateral Near:         MDM   1. Cough   2. Mild intermittent asthma with exacerbation   3. Essential hypertension    No signs of infection on exam or CXR which is reassuring. I have advised continuation of her medications including inhaler every 6 hours while awake for the next few days. She may use Delsym during the day and provided hycodan at night if needed. She also has Best boy if they work better than Delsym. Push fluids and rest. Reactive cough can linger, but certainly if worsens then go to the ED. Otherwise keep good FU with PCP. Also f/u regarding HTN.     Bjorn Pippin, [place] 04/11/17 1950

## 2017-04-11 NOTE — Discharge Instructions (Signed)
Nice to meet you. Please use Delsym or tessalon pereles as directed during the day for cough. Drink plenty of water. Continue with your inhaler every 6 hours for the next few days. No signs of infection today. Please f/u with your PCP for elevated BP as well. Feel better.

## 2017-04-11 NOTE — ED Triage Notes (Signed)
Pt  Reports      Symptoms  Of  Cough   Congestion  Tightness  In  Chest   With  Onset of  Symptoms  For  A  Few  Days  The  Pt  States   She    Saw  Her  pcp       A  Few  Days  Ago        And  Had  A  Breathing  Treatment

## 2017-04-12 ENCOUNTER — Ambulatory Visit: Payer: PPO | Admitting: Family Medicine

## 2017-04-17 DIAGNOSIS — J301 Allergic rhinitis due to pollen: Secondary | ICD-10-CM | POA: Diagnosis not present

## 2017-04-17 DIAGNOSIS — J3089 Other allergic rhinitis: Secondary | ICD-10-CM | POA: Diagnosis not present

## 2017-04-21 DIAGNOSIS — J3089 Other allergic rhinitis: Secondary | ICD-10-CM | POA: Diagnosis not present

## 2017-04-21 DIAGNOSIS — J301 Allergic rhinitis due to pollen: Secondary | ICD-10-CM | POA: Diagnosis not present

## 2017-04-28 DIAGNOSIS — J3089 Other allergic rhinitis: Secondary | ICD-10-CM | POA: Diagnosis not present

## 2017-04-28 DIAGNOSIS — J4540 Moderate persistent asthma, uncomplicated: Secondary | ICD-10-CM | POA: Diagnosis not present

## 2017-04-28 DIAGNOSIS — H1045 Other chronic allergic conjunctivitis: Secondary | ICD-10-CM | POA: Diagnosis not present

## 2017-04-28 DIAGNOSIS — J301 Allergic rhinitis due to pollen: Secondary | ICD-10-CM | POA: Diagnosis not present

## 2017-05-06 DIAGNOSIS — J301 Allergic rhinitis due to pollen: Secondary | ICD-10-CM | POA: Diagnosis not present

## 2017-05-06 DIAGNOSIS — J3089 Other allergic rhinitis: Secondary | ICD-10-CM | POA: Diagnosis not present

## 2017-05-12 ENCOUNTER — Telehealth: Payer: Self-pay | Admitting: Cardiology

## 2017-05-12 DIAGNOSIS — J301 Allergic rhinitis due to pollen: Secondary | ICD-10-CM | POA: Diagnosis not present

## 2017-05-12 DIAGNOSIS — J3089 Other allergic rhinitis: Secondary | ICD-10-CM | POA: Diagnosis not present

## 2017-05-12 MED ORDER — HYDRALAZINE HCL 50 MG PO TABS
ORAL_TABLET | ORAL | 1 refills | Status: DC
Start: 2017-05-12 — End: 2017-05-13

## 2017-05-12 NOTE — Telephone Encounter (Signed)
Pt states she first noticed BP elevated 04/25/17 --BP 171/103 HR 67 Pt did not check BP again until 05/05/17. 05/05/17 165/104 HR 69 05/06/17 164/104 HR 69 05/11/17 166/102 164/99 164/104 HR 68  Pt states she started new allergy medication 04/09/17-Montelukast sod 10 mg daily in the evening.

## 2017-05-12 NOTE — Telephone Encounter (Signed)
New Message    Pt c/o BP issue: STAT if pt c/o blurred vision, one-sided weakness or slurred speech  1. What are your last 5 BP readings? 164/99, 166/102, 164/104, 165/104  2. Are you having any other symptoms (ex. Dizziness, headache, blurred vision, passed out)? no  3. What is your BP issue? Running high

## 2017-05-12 NOTE — Telephone Encounter (Signed)
Per Dr Vonna Drafts follow-up appt with Dr Nelson/Team.  Pt aware to take extra hydralazine 50mg  if SBP> 160, continue to take and record BP, follow -up scheduled with Mare Loan, PA 06/02/17. Pt states she is going to hold allergy medication, montelukast, for now.

## 2017-05-12 NOTE — Telephone Encounter (Signed)
I reviewed with Dr Yvonne Kendall Dr Johnsie Cancel take extra hydralazine 50 mg if SBP >160, continue to take and record BPs.

## 2017-05-13 ENCOUNTER — Telehealth: Payer: Self-pay | Admitting: Cardiology

## 2017-05-13 ENCOUNTER — Encounter: Payer: Self-pay | Admitting: Nurse Practitioner

## 2017-05-13 ENCOUNTER — Ambulatory Visit (INDEPENDENT_AMBULATORY_CARE_PROVIDER_SITE_OTHER): Payer: PPO | Admitting: Nurse Practitioner

## 2017-05-13 ENCOUNTER — Encounter (INDEPENDENT_AMBULATORY_CARE_PROVIDER_SITE_OTHER): Payer: Self-pay

## 2017-05-13 VITALS — BP 132/74 | HR 68 | Ht 62.0 in | Wt 189.8 lb

## 2017-05-13 DIAGNOSIS — I10 Essential (primary) hypertension: Secondary | ICD-10-CM | POA: Diagnosis not present

## 2017-05-13 MED ORDER — SPIRONOLACTONE 25 MG PO TABS: 12.5000 mg | ORAL_TABLET | Freq: Every day | ORAL | 6 refills | Status: DC

## 2017-05-13 MED ORDER — HYDRALAZINE HCL 50 MG PO TABS: ORAL_TABLET | ORAL | 1 refills | Status: DC

## 2017-05-13 NOTE — Progress Notes (Signed)
CARDIOLOGY OFFICE NOTE  Date:  05/13/2017    Brianna Mccarty Date of Birth: 1941-06-04 Medical Record #[id number]  PCP:  Brianna Kern, DO  Cardiologist:  Brianna Mccarty  Chief Complaint  Patient presents with  . Hypertension    Work in visit - seen for Dr. Meda Mccarty    History of Present Illness: Brianna Mccarty is a 76 y.o. female who presents today for a work in visit. Seen for Dr. Meda Mccarty. Former patient of Dr Ron Parker.   She has a history of HLD - no longer on statin therapy due to cramps. Also with HTN. Negative stress test back in March of 2018. Uses lots of supplements. Lots of stress noted as well.   Phone call today - "Pt states BP continues to be in the 160/90 range, HR up to 90 today, just doesn't feel good today, requesting appt".  Had called earlier this week - BP high - extra Hydralazine recommended and she had follow up for later this month with Brianna Henri, PA. But added to my schedule today due to return call.   Comes in today. Here alone. She is very nervous. She has taken extra Hydralazine today and yesterday. BP did not go down yesterday but better here today. She is worried about her HR - her readings show that they are all in the 60's. She is on multiple agents for her BP. Lots of stress - not really discussed today. She says she does not use salt but eats out a fair amount - last ate out at the [employer]. No real swelling noted but she does not think the HCTZ works that well -  she still feels tight all over. Has grade 2 DD on last echo. No chest pain. Breathing is ok for the most part - she does have asthma. Tries to exercise by walking several times a week.   Past Medical History:  Diagnosis Date  . Abnormal EKG    Normal LV function in the past  . Allergic rhinitis   . Asthma   . Bronchitis, mucopurulent recurrent ([place])   . Carotid artery disease ([place])    Doppler, April, 2009, 0-39% bilateral  . Cervical dysplasia 1971  . Chest pain, unspecified   .  Diverticulosis of colon   . DJD (degenerative joint disease)   . Ejection fraction    EF 60%, echo, 2009  . GERD (gastroesophageal reflux disease)   . Headache(784.0)   . Hypercholesterolemia   . Hypertension   . Interstitial cystitis    sees urologist  . Lichen sclerosus   . Malignant melanoma ([place])    sees Dr. Nevada Crane in dermatology  . Migraines   . Mitral valve disease    Question mitral valve prolapse in the past, no prolapse by echo 2009  . Murmur 10/20/2015  . Osteoporosis    on fosomax > 5 years, stopped 11/2015  . Renal calculus    sees urologist  . Thyroid cyst    1 x 1.1 thyroid cyst noted on carotid Doppler, January, 2012  . Venous insufficiency     Past Surgical History:  Procedure Laterality Date  . BREAST BIOPSY Left 11/25/2011   U/S core, benign performed at [facility]  . BREAST CYST ASPIRATION    . BREAST SURGERY  2013   Breast Bx-Benign  . CARDIAC CATHETERIZATION    . CATARACT EXTRACTION, BILATERAL    . cystoscopy and basket stone removal right ureter  02/2006   Dr. Terance Hart  .  GYNECOLOGIC CRYOSURGERY  1971  . left total hip replacement  2004   Dr. Gladstone Lighter  . melanoma removed from medial rleft knee area  2006   Dr. Nevada Crane  . NASAL SEPTUM SURGERY       Medications: Current Meds  Medication Sig  . acyclovir (ZOVIRAX) 400 MG tablet 400mg  tid x5 days for outbreak  . albuterol (PROAIR HFA) 108 (90 BASE) MCG/ACT inhaler Inhale 2 puffs into the lungs every 6 (six) hours as needed for wheezing.  Marland Kitchen amLODipine (NORVASC) 10 MG tablet TAKE 1 TABLET BY MOUTH EVERY DAY  . Ascorbic Acid (VITAMIN C) 500 MG tablet Take 500 mg by mouth daily.    Marland Kitchen aspirin 81 MG tablet Take 81 mg by mouth daily.    Marland Kitchen azelastine (ASTELIN) 0.1 % nasal spray USE 2 SPRAYS IN EACH NOSTRIL TWICE A DAY, AS NEEDED FOR NASAL ALLERGY SYMPTOMS  . budesonide-formoterol (SYMBICORT) 160-4.5 MCG/ACT inhaler Inhale 2 puffs into the lungs 2 (two) times daily.  . Calcium Carbonate (CALCIUM 600) 1500 MG TABS  Take 1 tablet by mouth daily.    . Cholecalciferol (VITAMIN D3) 5000 UNITS CAPS Take 1 capsule by mouth daily.    . Cinnamon 500 MG TABS Take by mouth. With Chromium  . clobetasol cream (TEMOVATE) 5.45 % Apply 1 application topically at bedtime as needed.  . Coenzyme Q10 (COQ10) 100 MG CAPS Take 1 capsule by mouth daily.    Marland Kitchen desmopressin (DDAVP) 0.2 MG tablet Take 3 tablets by mouth daily at bedtime   . fexofenadine (ALLEGRA) 60 MG tablet Take 60 mg by mouth daily.  . hydrALAZINE (APRESOLINE) 50 MG tablet Take 1 tablet by mouth three times a day  . hydrOXYzine (ATARAX/VISTARIL) 10 MG tablet Take 10 mg by mouth daily.  Marland Kitchen losartan (COZAAR) 100 MG tablet TAKE 1 TABLET (100 MG TOTAL) BY MOUTH DAILY.  . magnesium oxide (MAG-OX) 400 MG tablet Take 400 mg by mouth daily.    . Multiple Vitamin (MULTIVITAMIN) capsule Take 1 capsule by mouth daily.    . Naproxen Sodium (ALEVE PO) Take by mouth as needed. For back and shoulder pain  . omeprazole (PRILOSEC) 40 MG capsule TAKE 1 CAPSULE (40 MG TOTAL) BY MOUTH DAILY.  Marland Kitchen OVER THE COUNTER MEDICATION Bone growth factor  . OVER THE COUNTER MEDICATION 3 oz daily. Nopolea  . OVER THE COUNTER MEDICATION Juice plus  . OVER THE COUNTER MEDICATION Occuvite 50+ eye vitamin  . pentosan polysulfate (ELMIRON) 100 MG capsule Take 100 mg by mouth 3 (three) times daily before meals.    . prazosin (MINIPRESS) 5 MG capsule TAKE ONE CAPSULE BY MOUTH TWICE A DAY  . PRESCRIPTION MEDICATION Allergy shots every other week  . Probiotic Product (PROBIOTIC PO) 1 daily  . [DISCONTINUED] hydrALAZINE (APRESOLINE) 50 MG tablet Take 1 tablet by mouth three times a day and take extra tablet (50 mg) if SBP>160.     Allergies: Allergies  Allergen Reactions  . Cephalexin     REACTION: high fever  . Penicillins     REACTION: severe swelling and hives  . Phenobarbital     REACTION: hives  . Tape     rash    Social History: The patient  reports that she has never smoked. She  has never used smokeless tobacco. She reports that she drinks about 0.6 oz of alcohol per week . She reports that she does not use drugs.   Family History: The patient's family history includes Cancer in her brother  and father; Colon cancer in her paternal uncle; Diabetes in her brother, brother, and mother; Heart disease in her brother and mother; Hypertension in her brother, brother, sister, and sister.   Review of Systems: Please see the history of present illness.   Otherwise, the review of systems is positive for none.   All other systems are reviewed and negative.   Physical Exam: VS:  BP 132/74   Pulse 68   Ht 5\' 2"  (1.575 m)   Wt 189 lb 12.8 oz (86.1 kg)   BMI 34.71 kg/m  .  BMI Body mass index is 34.71 kg/m.  Wt Readings from Last 3 Encounters:  05/13/17 189 lb 12.8 oz (86.1 kg)  03/07/17 188 lb 8 oz (85.5 kg)  01/27/17 187 lb (84.8 kg)    General: Pleasant. Obese. Female. Seems a little anxious but alert and in no acute distress.   HEENT: Normal.  Neck: Supple, no JVD, carotid bruits, or masses noted.  Cardiac: Regular rate and rhythm. Outflow murmur noted. Trace edema.  Respiratory:  Lungs are clear to auscultation bilaterally with normal work of breathing.  GI: Soft and nontender.  MS: No deformity or atrophy. Gait and ROM intact.  Skin: Warm and dry. Color is normal.  Neuro:  Strength and sensation are intact and no gross focal deficits noted.  Psych: Alert, appropriate and with normal affect.   LABORATORY DATA:  EKG:  EKG is ordered today. This demonstrates NSR - does have short PR noted.  Lab Results  Component Value Date   WBC 4.6 11/13/2016   HGB 11.9 11/13/2016   HCT 36.6 11/13/2016   PLT 257 11/13/2016   GLUCOSE 96 01/23/2017   CHOL 200 (H) 01/23/2017   TRIG 34 01/23/2017   HDL 81 01/23/2017   LDLDIRECT 126.6 07/18/2010   LDLCALC 112 (H) 01/23/2017   ALT 17 01/23/2017   AST 19 01/23/2017   NA 139 01/23/2017   K 4.4 01/23/2017   CL 100  01/23/2017   CREATININE 0.70 01/23/2017   BUN 17 01/23/2017   CO2 23 01/23/2017   TSH 3.510 11/13/2016     BNP (last 3 results) No results for input(s): BNP in the last 8760 hours.  ProBNP (last 3 results) No results for input(s): PROBNP in the last 8760 hours.   Other Studies Reviewed Today:  Myoview Study Highlights 01/2017   Nuclear stress EF: 71%.  There was no ST segment deviation noted during stress.  The study is normal.  This is a low risk study.  The left ventricular ejection fraction is hyperdynamic (>65%).       TTE: 01/2017 - Left ventricle: The cavity size was normal. Systolic function was normal. The estimated ejection fraction was in the range of 55% to 60%. Wall motion was normal; there were no regional wall motion abnormalities. Features are consistent with a pseudonormal left ventricular filling pattern, with concomitant abnormal relaxation and increased filling pressure (grade 2 diastolic dysfunction). - Aortic valve: There was very mild stenosis. - Mitral valve: Calcified annulus. - Left atrium: The atrium was severely dilated.    ASSESSMENT AND PLAN:  1. Hypertension - not controlled - adding low dose Aldactone today at 12.5 mg daily - will hold on using extra Hydralazine but could increase that dose if needed to 75 mg TID. Needs to restrict salt and eat out less. Continue with BP monitoring at home.   2. Fatigue and significant family history of premature coronary artery disease - her most stress test  showed no evidence of prior infarct or ischemia.   3. Hyperlipidemia - no longer on statin due to intolerance  4. Carotid disease - minimal plaque on carotid US in 05/2015, she is due for follow up now. She wants to arrange this on follow up - she does not have her calendar on her today.   5. Systolic murmur - echo in January 2018 showed mild aortic stenosis. No cardinal symptoms noted.   Current medicines are reviewed  with the patient today.  The patient does not have concerns regarding medicines other than what has been noted above.  The following changes have been made:  See above.  Labs/ tests ordered today include:    Orders Placed This Encounter  Procedures  . Basic metabolic panel  . Basic metabolic panel  . EKG 12-Lead     Disposition:   FU with Dr. Francesca Oman team later this month as planned.   Patient is agreeable to this plan and will call if any problems develop in the interim.   SignedTruitt Merle, NP  05/13/2017 2:39 PM  [facility] [address] [place], [place]  [address] Phone: [phone] Fax: [phone]

## 2017-05-13 NOTE — Telephone Encounter (Signed)
Pt aware she has been scheduled to see Truitt Merle, NP today at 2 PM.

## 2017-05-13 NOTE — Telephone Encounter (Signed)
Pt states BP continues to be in the 160/90 range, HR up to 90 today, just doesn't feel good today, requesting appt.

## 2017-05-13 NOTE — Patient Instructions (Addendum)
We will be checking the following labs today - BMET today  BMET in one week   Medication Instructions:    Continue with your current medicines. But  Do not take anymore extra Hydralazine  Adding Aldactone 25 mg - take just a half a pill each day in the mornings    Testing/Procedures To Be Arranged:  N/A  Follow-Up:   Keep your visit set up for later this month with Dr. Meda Coffee and her team.     Other Special Instructions:   Try to eat less salt - less eating out.   Keep a check on your BP for [place]    If you need a refill on your cardiac medications before your next appointment, please call your pharmacy.   Call the [place] office at [phone] if you have any questions, problems or concerns.

## 2017-05-13 NOTE — Telephone Encounter (Signed)
New Message  Pt call requesting to get an appt for this week with Dr. Meda Coffee app for her high bp. Pt states she has spoken with RN about high BP. Pt states she would like to be seen this week. Please call back to discuss

## 2017-05-14 LAB — BASIC METABOLIC PANEL
BUN/Creatinine Ratio: 19 (ref 12–28)
BUN: 14 mg/dL (ref 8–27)
CO2: 24 mmol/L (ref 20–29)
Calcium: 9.4 mg/dL (ref 8.7–10.3)
Chloride: 96 mmol/L (ref 96–106)
Creatinine, Ser: 0.73 mg/dL (ref 0.57–1.00)
GFR calc Af Amer: 92 mL/min/{1.73_m2} (ref >59)
GFR calc non Af Amer: 80 mL/min/{1.73_m2} (ref >59)
Glucose: 102 mg/dL — ABNORMAL HIGH (ref 65–99)
Potassium: 4.2 mmol/L (ref 3.5–5.2)
Sodium: 135 mmol/L (ref 134–144)

## 2017-05-20 ENCOUNTER — Other Ambulatory Visit: Payer: PPO

## 2017-05-20 ENCOUNTER — Encounter (INDEPENDENT_AMBULATORY_CARE_PROVIDER_SITE_OTHER): Payer: Self-pay

## 2017-05-20 DIAGNOSIS — I10 Essential (primary) hypertension: Secondary | ICD-10-CM | POA: Diagnosis not present

## 2017-05-21 ENCOUNTER — Other Ambulatory Visit: Payer: Self-pay | Admitting: *Deleted

## 2017-05-21 DIAGNOSIS — J3089 Other allergic rhinitis: Secondary | ICD-10-CM | POA: Diagnosis not present

## 2017-05-21 DIAGNOSIS — J301 Allergic rhinitis due to pollen: Secondary | ICD-10-CM | POA: Diagnosis not present

## 2017-05-21 LAB — BASIC METABOLIC PANEL
BUN/Creatinine Ratio: 20 (ref 12–28)
BUN: 12 mg/dL (ref 8–27)
CO2: 26 mmol/L (ref 20–29)
Calcium: 9.5 mg/dL (ref 8.7–10.3)
Chloride: 82 mmol/L — ABNORMAL LOW (ref 96–106)
Creatinine, Ser: 0.6 mg/dL (ref 0.57–1.00)
GFR calc Af Amer: 102 mL/min/{1.73_m2} (ref >59)
GFR calc non Af Amer: 88 mL/min/{1.73_m2} (ref >59)
Glucose: 106 mg/dL — ABNORMAL HIGH (ref 65–99)
Potassium: 3.4 mmol/L — ABNORMAL LOW (ref 3.5–5.2)
Sodium: 123 mmol/L — ABNORMAL LOW (ref 134–144)

## 2017-05-21 MED ORDER — SPIRONOLACTONE 25 MG PO TABS: 25.0000 mg | ORAL_TABLET | Freq: Every day | ORAL | 6 refills | Status: DC

## 2017-05-24 ENCOUNTER — Encounter (HOSPITAL_COMMUNITY): Payer: Self-pay | Admitting: Emergency Medicine

## 2017-05-24 ENCOUNTER — Inpatient Hospital Stay (HOSPITAL_COMMUNITY)
Admission: EM | Admit: 2017-05-24 | Discharge: 2017-05-26 | DRG: 641 | Disposition: A | Discharge status: Home / Self Care | Payer: PPO | Attending: Family Medicine | Admitting: Family Medicine

## 2017-05-24 DIAGNOSIS — J45909 Unspecified asthma, uncomplicated: Secondary | ICD-10-CM | POA: Diagnosis not present

## 2017-05-24 DIAGNOSIS — E785 Hyperlipidemia, unspecified: Secondary | ICD-10-CM | POA: Diagnosis present

## 2017-05-24 DIAGNOSIS — R112 Nausea with vomiting, unspecified: Secondary | ICD-10-CM | POA: Diagnosis not present

## 2017-05-24 DIAGNOSIS — B001 Herpesviral vesicular dermatitis: Secondary | ICD-10-CM | POA: Diagnosis not present

## 2017-05-24 DIAGNOSIS — Z79899 Other long term (current) drug therapy: Secondary | ICD-10-CM | POA: Diagnosis not present

## 2017-05-24 DIAGNOSIS — N3010 Interstitial cystitis (chronic) without hematuria: Secondary | ICD-10-CM | POA: Diagnosis present

## 2017-05-24 DIAGNOSIS — Z85820 Personal history of malignant melanoma of skin: Secondary | ICD-10-CM

## 2017-05-24 DIAGNOSIS — E871 Hypo-osmolality and hyponatremia: Principal | ICD-10-CM | POA: Diagnosis present

## 2017-05-24 DIAGNOSIS — Z8249 Family history of ischemic heart disease and other diseases of the circulatory system: Secondary | ICD-10-CM | POA: Diagnosis not present

## 2017-05-24 DIAGNOSIS — E876 Hypokalemia: Secondary | ICD-10-CM | POA: Diagnosis present

## 2017-05-24 DIAGNOSIS — M810 Age-related osteoporosis without current pathological fracture: Secondary | ICD-10-CM | POA: Diagnosis present

## 2017-05-24 DIAGNOSIS — Z96642 Presence of left artificial hip joint: Secondary | ICD-10-CM | POA: Diagnosis not present

## 2017-05-24 DIAGNOSIS — Z87410 Personal history of cervical dysplasia: Secondary | ICD-10-CM

## 2017-05-24 DIAGNOSIS — I10 Essential (primary) hypertension: Secondary | ICD-10-CM | POA: Diagnosis not present

## 2017-05-24 DIAGNOSIS — K219 Gastro-esophageal reflux disease without esophagitis: Secondary | ICD-10-CM | POA: Diagnosis present

## 2017-05-24 DIAGNOSIS — Z881 Allergy status to other antibiotic agents status: Secondary | ICD-10-CM | POA: Diagnosis not present

## 2017-05-24 DIAGNOSIS — Z8619 Personal history of other infectious and parasitic diseases: Secondary | ICD-10-CM | POA: Diagnosis present

## 2017-05-24 DIAGNOSIS — Z888 Allergy status to other drugs, medicaments and biological substances status: Secondary | ICD-10-CM

## 2017-05-24 LAB — CBC
HEMATOCRIT: 35.2 % — AB (ref 36.0–46.0)
HEMATOCRIT: 34.6 % — AB (ref 36.0–46.0)
HEMOGLOBIN: 12.5 g/dL (ref 12.0–15.0)
Hemoglobin: 12.4 g/dL (ref 12.0–15.0)
MCH: 28.2 pg (ref 26.0–34.0)
MCH: 28.9 pg (ref 26.0–34.0)
MCHC: 35.2 g/dL (ref 30.0–36.0)
MCHC: 36.1 g/dL — ABNORMAL HIGH (ref 30.0–36.0)
MCV: 80.2 fL (ref 78.0–100.0)
MCV: 79.9 fL (ref 78.0–100.0)
Platelets: 239 10*3/uL (ref 150–400)
Platelets: 221 10*3/uL (ref 150–400)
RBC: 4.39 MIL/uL (ref 3.87–5.11)
RBC: 4.33 MIL/uL (ref 3.87–5.11)
RDW: 12.3 % (ref 11.5–15.5)
RDW: 12.3 % (ref 11.5–15.5)
WBC: 7.9 10*3/uL (ref 4.0–10.5)
WBC: 6.6 10*3/uL (ref 4.0–10.5)

## 2017-05-24 LAB — URINALYSIS, ROUTINE W REFLEX MICROSCOPIC
Bilirubin Urine: NEGATIVE
GLUCOSE, UA: NEGATIVE mg/dL
Hgb urine dipstick: NEGATIVE
KETONES UR: NEGATIVE mg/dL
LEUKOCYTES UA: NEGATIVE
NITRITE: NEGATIVE
PH: 7 (ref 5.0–8.0)
Protein, ur: NEGATIVE mg/dL
Specific Gravity, Urine: 1.005 (ref 1.005–1.030)

## 2017-05-24 LAB — BASIC METABOLIC PANEL
ANION GAP: 7 (ref 5–15)
BUN: 8 mg/dL (ref 6–20)
CALCIUM: 10.8 mg/dL — AB (ref 8.9–10.3)
CO2: 28 mmol/L (ref 22–32)
Chloride: 86 mmol/L — ABNORMAL LOW (ref 101–111)
Creatinine, Ser: 0.76 mg/dL (ref 0.44–1.00)
GLUCOSE: 116 mg/dL — AB (ref 65–99)
POTASSIUM: 2.9 mmol/L — AB (ref 3.5–5.1)
Sodium: 121 mmol/L — ABNORMAL LOW (ref 135–145)

## 2017-05-24 LAB — COMPREHENSIVE METABOLIC PANEL
ALT: 20 U/L (ref 14–54)
ANION GAP: 9 (ref 5–15)
AST: 25 U/L (ref 15–41)
Albumin: 4.2 g/dL (ref 3.5–5.0)
Alkaline Phosphatase: 50 U/L (ref 38–126)
BILIRUBIN TOTAL: 0.5 mg/dL (ref 0.3–1.2)
BUN: 10 mg/dL (ref 6–20)
CHLORIDE: 80 mmol/L — AB (ref 101–111)
CO2: 26 mmol/L (ref 22–32)
Calcium: 11.1 mg/dL — ABNORMAL HIGH (ref 8.9–10.3)
Creatinine, Ser: 0.75 mg/dL (ref 0.44–1.00)
Glucose, Bld: 115 mg/dL — ABNORMAL HIGH (ref 65–99)
POTASSIUM: 3.1 mmol/L — AB (ref 3.5–5.1)
Sodium: 115 mmol/L — CL (ref 135–145)
TOTAL PROTEIN: 6.7 g/dL (ref 6.5–8.1)

## 2017-05-24 LAB — LIPASE, BLOOD: Lipase: 57 U/L — ABNORMAL HIGH (ref 11–51)

## 2017-05-24 MED ORDER — ACETAMINOPHEN 325 MG PO TABS: 650.0000 mg | ORAL_TABLET | Freq: Four times a day (QID) | ORAL | Status: DC | PRN

## 2017-05-24 MED ORDER — PANTOPRAZOLE SODIUM 40 MG PO TBEC
40.0000 mg | DELAYED_RELEASE_TABLET | Freq: Every day | ORAL | Status: DC
  Administered 2017-05-25 – 2017-05-26 (×2): 40 mg via ORAL
  Filled 2017-05-24 (×2): qty 1

## 2017-05-24 MED ORDER — LOSARTAN POTASSIUM 50 MG PO TABS
100.0000 mg | ORAL_TABLET | Freq: Every day | ORAL | Status: DC
  Administered 2017-05-25 – 2017-05-26 (×2): 100 mg via ORAL
  Filled 2017-05-24 (×2): qty 2

## 2017-05-24 MED ORDER — ALBUTEROL SULFATE (2.5 MG/3ML) 0.083% IN NEBU: 2.5000 mg | INHALATION_SOLUTION | Freq: Four times a day (QID) | RESPIRATORY_TRACT | Status: DC | PRN

## 2017-05-24 MED ORDER — OCUVITE ADULT 50+ PO CAPS: 1.0000 | ORAL_CAPSULE | Freq: Every day | ORAL | Status: DC

## 2017-05-24 MED ORDER — OCUVITE-LUTEIN PO CAPS
1.0000 | ORAL_CAPSULE | Freq: Every day | ORAL | Status: DC
  Filled 2017-05-24: qty 1

## 2017-05-24 MED ORDER — AMLODIPINE BESYLATE 10 MG PO TABS
10.0000 mg | ORAL_TABLET | Freq: Every day | ORAL | Status: DC
  Administered 2017-05-25 – 2017-05-26 (×2): 10 mg via ORAL
  Filled 2017-05-24 (×2): qty 1

## 2017-05-24 MED ORDER — PENTOSAN POLYSULFATE SODIUM 100 MG PO CAPS
100.0000 mg | ORAL_CAPSULE | Freq: Three times a day (TID) | ORAL | Status: DC
  Administered 2017-05-25 – 2017-05-26 (×5): 100 mg via ORAL
  Filled 2017-05-24 (×6): qty 1

## 2017-05-24 MED ORDER — ASPIRIN EC 81 MG PO TBEC
81.0000 mg | DELAYED_RELEASE_TABLET | Freq: Every day | ORAL | Status: DC
  Administered 2017-05-24 – 2017-05-26 (×3): 81 mg via ORAL
  Filled 2017-05-24 (×4): qty 1

## 2017-05-24 MED ORDER — PRAZOSIN HCL 2 MG PO CAPS
5.0000 mg | ORAL_CAPSULE | Freq: Two times a day (BID) | ORAL | Status: DC
  Administered 2017-05-24 – 2017-05-26 (×4): 5 mg via ORAL
  Filled 2017-05-24 (×6): qty 1

## 2017-05-24 MED ORDER — SODIUM CHLORIDE 0.9 % IV BOLUS (SEPSIS)
1000.0000 mL | Freq: Once | INTRAVENOUS | Status: AC
  Administered 2017-05-24: 1000 mL via INTRAVENOUS

## 2017-05-24 MED ORDER — ACETAMINOPHEN 650 MG RE SUPP: 650.0000 mg | Freq: Four times a day (QID) | RECTAL | Status: DC | PRN

## 2017-05-24 MED ORDER — DESMOPRESSIN ACETATE 0.2 MG PO TABS
0.2000 mg | ORAL_TABLET | Freq: Two times a day (BID) | ORAL | Status: DC
  Administered 2017-05-25 – 2017-05-26 (×4): 0.2 mg via ORAL
  Filled 2017-05-24 (×5): qty 1

## 2017-05-24 MED ORDER — ONDANSETRON HCL 4 MG/2ML IJ SOLN
4.0000 mg | Freq: Once | INTRAMUSCULAR | Status: AC
  Administered 2017-05-24: 4 mg via INTRAVENOUS
  Filled 2017-05-24: qty 2

## 2017-05-24 MED ORDER — POTASSIUM CHLORIDE CRYS ER 20 MEQ PO TBCR
40.0000 meq | EXTENDED_RELEASE_TABLET | Freq: Once | ORAL | Status: AC
Start: 2017-05-24 — End: 2017-05-24
  Administered 2017-05-24: 40 meq via ORAL
  Filled 2017-05-24: qty 2

## 2017-05-24 MED ORDER — HYDRALAZINE HCL 50 MG PO TABS
50.0000 mg | ORAL_TABLET | Freq: Three times a day (TID) | ORAL | Status: DC
  Administered 2017-05-24 – 2017-05-26 (×5): 50 mg via ORAL
  Filled 2017-05-24 (×5): qty 1

## 2017-05-24 MED ORDER — MOMETASONE FURO-FORMOTEROL FUM 200-5 MCG/ACT IN AERO
2.0000 | INHALATION_SPRAY | Freq: Two times a day (BID) | RESPIRATORY_TRACT | Status: DC
  Administered 2017-05-24 – 2017-05-26 (×4): 2 via RESPIRATORY_TRACT
  Filled 2017-05-24: qty 8.8

## 2017-05-24 MED ORDER — ENOXAPARIN SODIUM 40 MG/0.4ML ~~LOC~~ SOLN
40.0000 mg | SUBCUTANEOUS | Status: DC
  Administered 2017-05-24 – 2017-05-25 (×2): 40 mg via SUBCUTANEOUS
  Filled 2017-05-24 (×2): qty 0.4

## 2017-05-24 MED ORDER — ALBUTEROL SULFATE HFA 108 (90 BASE) MCG/ACT IN AERS: 2.0000 | INHALATION_SPRAY | Freq: Four times a day (QID) | RESPIRATORY_TRACT | Status: DC | PRN

## 2017-05-24 NOTE — ED Notes (Signed)
Dr. Manya Silvas at bedside

## 2017-05-24 NOTE — ED Notes (Signed)
Dr. Manya Silvas notified of pt sodium level

## 2017-05-24 NOTE — ED Notes (Signed)
Report attempted, RN to call back. 

## 2017-05-24 NOTE — ED Triage Notes (Signed)
Pt c/o abdominal pain with N/V onset Thursday night. Pt denies exposure to others with illness.

## 2017-05-24 NOTE — H&P (Addendum)
History and Physical    Brianna Brianna Mccarty IOE:[id number] DOB: 10/26/1941 DOA: 05/24/2017  PCP: Brianna Kern, DO  Patient coming from: Home  Chief Complaint: Abdominal pain  HPI: Brianna Brianna Mccarty is a 76 y.o. female with medical history significant of osteoporosis, melanoma s/p excision many years ago, HTN, HLD, GERD, fever blisters, interstitial cystitis, asthma who presents for abdominal pain.  Brianna Brianna Mccarty reports that on Thursday she developed lower abdominal pain which was dull in nature, 8/10 at its worst. She had no associated constipation or diarrhea or swelling.  She does not note eating anything differently.  The pain is now completely resolved.  Today, she developed vomiting X 3.  She notes that the emesis contained her pills from this morning along with some dark brown phlegm and clear liquid which she found odd. Due to this she came in to the hospital.  She denies fever, chills, jaundice, dysphagia, odynophagia, hemetemesis, weakness, falls, dizziness, change in stool color, constipation, diarrhea.  She denies AMS, confusion, seizure like activity.  She denies fatigue.  She notes anorexia and not eating for the last 2 days.  She takes a calcium supplement but has not taken it for the last week.  She also has had recent changes to her BP medications.  She is on chronic DDAVP to decrease her urination at night related to her interstitial cystitis.  She has been seeing her cardiologist Dr. Meda Mccarty who has been changing her medications.  She was seen on 7/10 and her Na was 135.  She was seen on 7/17 and her Na was 123 with a K of 3.4.  At this time she reports being told to increase her spironolactone to 25mg .  Today in the ED, her Na was 115.    Reviewed Cardiology note Brianna Brianna Mccarty) from 7/10.  BP was elevated and spironolactone was added at 12.5mg .  She had labs on 7/17 and reports being called and told to increase spironolactone to 25mg .  Based on documentation in the Cardiology note, patient was  taking extra hydralazine because her blood pressure was high.    She reports to me taking HCTZ for a while.  She used to be on lasix, but was changed to this some time ago.    Reviewed medications: Spironolactone can cause hyponatremia, as can HCTZ, DDAVP.    ED Course: In the ED, she was given Zofran for nausea and a 1L fluid bolus of NS.  She was found to be severely hyponatremic, though with only mild symptoms.   Review of Systems: As per HPI otherwise 10 point review of systems negative.    Past Medical History:  Diagnosis Date  . Abnormal EKG    Normal LV function in the past  . Allergic rhinitis   . Asthma   . Bronchitis, mucopurulent recurrent ([place])   . Carotid artery disease ([place])    Doppler, April, 2009, 0-39% bilateral  . Cervical dysplasia 1971  . Chest pain, unspecified   . Diverticulosis of colon   . DJD (degenerative joint disease)   . Ejection fraction    EF 60%, echo, 2009  . GERD (gastroesophageal reflux disease)   . Headache(784.0)   . Hypercholesterolemia   . Hypertension   . Interstitial cystitis    sees urologist  . Lichen sclerosus   . Malignant melanoma ([place])    sees Dr. Nevada Crane in dermatology  . Migraines   . Mitral valve disease    Question mitral valve prolapse in the past, no prolapse  by echo 2009  . Murmur 10/20/2015  . Osteoporosis    on fosomax > 5 years, stopped 11/2015  . Renal calculus    sees urologist  . Thyroid cyst    1 x 1.1 thyroid cyst noted on carotid Doppler, January, 2012  . Venous insufficiency     Past Surgical History:  Procedure Laterality Date  . BREAST BIOPSY Left 11/25/2011   U/S core, benign performed at [facility]  . BREAST CYST ASPIRATION    . BREAST SURGERY  2013   Breast Bx-Benign  . CARDIAC CATHETERIZATION    . CATARACT EXTRACTION, BILATERAL    . cystoscopy and basket stone removal right ureter  02/2006   Dr. Terance Hart  . GYNECOLOGIC CRYOSURGERY  1971  . left total hip replacement  2004   Dr. Gladstone Lighter  .  melanoma removed from medial rleft knee area  2006   Dr. Nevada Crane  . NASAL SEPTUM SURGERY     Reviewed with patient.   reports that she has never smoked. She has never used smokeless tobacco. She reports that she drinks about 0.6 oz of alcohol per week . She reports that she does not use drugs.  Allergies  Allergen Reactions  . Cephalexin     Reaction was a high fever  . Penicillins Hives and Swelling    Swelling of arms & face Has patient had a PCN reaction causing immediate rash, facial/tongue/throat swelling, SOB or lightheadedness with hypotension: Yes Has patient had a PCN reaction causing severe rash involving mucus membranes or skin necrosis: No Has patient had a PCN reaction that required hospitalization: No Has patient had a PCN reaction occurring within the last 10 years: No If all of the above answers are "NO", then may proceed with Cephalosporin use.   Marland Kitchen Phenobarbital Hives  . Tape Rash   Reviewed with patient.  Family History  Problem Relation Age of Onset  . Cancer Father        Pancreatic  . Diabetes Mother   . Heart disease Mother   . Cancer Brother        Bile duct  . Diabetes Brother   . Hypertension Brother   . Diabetes Brother   . Heart disease Brother   . Hypertension Brother   . Hypertension Sister   . Hypertension Sister   . Colon cancer Paternal Uncle     Prior to Admission medications   Medication Sig Start Date End Date Taking? Authorizing Provider  acyclovir (ZOVIRAX) 400 MG tablet 400mg  tid x5 days for outbreak 11/27/15   Brianna Kern, DO  albuterol [facility] HFA) 108 (90 BASE) MCG/ACT inhaler Inhale 2 puffs into the lungs every 6 (six) hours as needed for wheezing. 06/23/15   Noralee Space, MD  albuterol (PROVENTIL) (2.5 MG/3ML) 0.083% nebulizer solution Take 3 mLs (2.5 mg total) by nebulization every 6 (six) hours as needed. 06/23/15 01/31/17  Noralee Space, MD  amLODipine (NORVASC) 10 MG tablet TAKE 1 TABLET BY MOUTH EVERY DAY 01/20/17   Brianna Kern, DO  Ascorbic Acid (VITAMIN C) 500 MG tablet Take 500 mg by mouth daily.      [provider]  aspirin 81 MG tablet Take 81 mg by mouth daily.      [provider]  azelastine (ASTELIN) 0.1 % nasal spray USE 2 SPRAYS IN EACH NOSTRIL TWICE A DAY, AS NEEDED FOR NASAL ALLERGY SYMPTOMS 11/19/16   [provider]  budesonide-formoterol (SYMBICORT) 160-4.5 MCG/ACT inhaler Inhale 2 puffs  into the lungs 2 (two) times daily. 11/27/15   Brianna Kern, DO  Calcium Carbonate (CALCIUM 600) 1500 MG TABS Take 1 tablet by mouth daily.      [provider]  Cholecalciferol (VITAMIN D3) 5000 UNITS CAPS Take 1 capsule by mouth daily.      [provider]  Cinnamon 500 MG TABS Take by mouth. With Chromium    [provider]  clobetasol cream (TEMOVATE) 6.62 % Apply 1 application topically at bedtime as needed. 07/03/15   Fontaine, Belinda Block, MD  Coenzyme Q10 (COQ10) 100 MG CAPS Take 1 capsule by mouth daily.      [provider]  desmopressin (DDAVP) 0.2 MG tablet Take 3 tablets by mouth daily at bedtime     [provider]  fexofenadine (ALLEGRA) 60 MG tablet Take 60 mg by mouth daily.    [provider]  hydrALAZINE (APRESOLINE) 50 MG tablet Take 1 tablet by mouth three times a day 05/13/17   Burtis Junes, NP  hydrochlorothiazide (HYDRODIURIL) 25 MG tablet Take 1 tablet (25 mg total) by mouth daily. 11/13/16 02/11/17  Dorothy Spark, MD  hydrOXYzine (ATARAX/VISTARIL) 10 MG tablet Take 10 mg by mouth daily.    [provider]  losartan (COZAAR) 100 MG tablet TAKE 1 TABLET (100 MG TOTAL) BY MOUTH DAILY. 01/20/17   Brianna Kern, DO  magnesium oxide (MAG-OX) 400 MG tablet Take 400 mg by mouth daily.      [provider]  Multiple Vitamin (MULTIVITAMIN) capsule Take 1 capsule by mouth daily.      [provider]  Naproxen Sodium (ALEVE PO) Take by mouth as needed. For back and shoulder pain    [provider]  omeprazole (PRILOSEC) 40 MG capsule TAKE 1 CAPSULE (40 MG TOTAL) BY MOUTH DAILY. 04/11/17   Brianna Kern, DO  OVER THE COUNTER MEDICATION Bone growth factor    [provider]  OVER THE COUNTER MEDICATION 3 oz daily. Nopolea    [provider]  OVER THE COUNTER MEDICATION Juice plus    [provider]  OVER THE COUNTER MEDICATION Occuvite 50+ eye vitamin    [provider]  pentosan polysulfate (ELMIRON) 100 MG capsule Take 100 mg by mouth 3 (three) times daily before meals.      [provider]  prazosin (MINIPRESS) 5 MG capsule TAKE ONE CAPSULE BY MOUTH TWICE A DAY 01/20/17   Brianna Kern, DO  PRESCRIPTION MEDICATION Allergy shots every other week    [provider]  Probiotic Product (PROBIOTIC PO) 1 daily    [provider]  spironolactone (ALDACTONE) 25 MG tablet Take 1 tablet (25 mg total) by mouth daily. 05/21/17 08/19/17  Burtis Junes, NP    Physical Exam: Vitals:   05/24/17 1945 05/24/17 2000 05/24/17 2102 05/24/17 2103  BP: (!) 148/69 (!) 159/69  (!) 143/76  Pulse: 85 61  88  Resp:    18  Temp:    99.1 F (37.3 C)  TempSrc:    Oral  SpO2: 100% 97%  99%  Weight:   186 lb 1.6 oz (84.4 kg)   Height:   5\' 2"  (1.575 m)     Constitutional: NAD, calm, comfortable, sitting up in bed.  Vitals:   05/24/17 1945 05/24/17 2000 05/24/17 2102 05/24/17 2103  BP: (!) 148/69 (!) 159/69  (!) 143/76  Pulse: 85 61  88  Resp:    18  Temp:  99.1 F (37.3 C)  TempSrc:    Oral  SpO2: 100% 97%  99%  Weight:   186 lb 1.6 oz (84.4 kg)   Height:   5\' 2"  (1.575 m)    Eyes: PERRL, lids and conjunctivae normal ENMT: Mucous membranes are moist. Posterior pharynx clear of any exudate or lesions. Neck: normal, supple Respiratory: clear to auscultation bilaterally, no wheezing, no crackles. Normal respiratory effort.  Cardiovascular: Regular rate and rhythm, + murmurs, no rubs / gallops. No extremity edema. 2+ pedal  pulses.  Abdomen: no tenderness, no masses palpated. Bowel sounds positive.  Musculoskeletal: no clubbing / cyanosis.  Normal muscle tone.  Skin: no rashes, lesions, ulcers.  Neurologic: CN 2-12 grossly intact. He is moving all extremities.  Psychiatric: Normal judgment and insight. Alert and oriented x 3. Normal mood.    Labs on Admission: I have personally reviewed following labs and imaging studies  CBC:  Recent Labs Lab 05/24/17 1752  WBC 7.9  HGB 12.4  HCT 35.2*  MCV 80.2  PLT 767   Basic Metabolic Panel:  Recent Labs Lab 05/20/17 1520 05/24/17 1752  NA 123* 115*  K 3.4* 3.1*  CL 82* 80*  CO2 26 26  GLUCOSE 106* 115*  BUN 12 10  CREATININE 0.60 0.75  CALCIUM 9.5 11.1*   GFR: Estimated Creatinine Clearance: 59.3 mL/min (by C-G formula based on SCr of 0.75 mg/dL). Liver Function Tests:  Recent Labs Lab 05/24/17 1752  AST 25  ALT 20  ALKPHOS 50  BILITOT 0.5  PROT 6.7  ALBUMIN 4.2    Recent Labs Lab 05/24/17 1752  LIPASE 57*   No results for input(s): AMMONIA in the last 168 hours. Coagulation Profile: No results for input(s): INR, PROTIME in the last 168 hours. Cardiac Enzymes: No results for input(s): CKTOTAL, CKMB, CKMBINDEX, TROPONINI in the last 168 hours. BNP (last 3 results) No results for input(s): PROBNP in the last 8760 hours. HbA1C: No results for input(s): HGBA1C in the last 72 hours. CBG: No results for input(s): GLUCAP in the last 168 hours. Lipid Profile: No results for input(s): CHOL, HDL, LDLCALC, TRIG, CHOLHDL, LDLDIRECT in the last 72 hours. Thyroid Function Tests: No results for input(s): TSH, T4TOTAL, FREET4, T3FREE, THYROIDAB in the last 72 hours. Anemia Panel: No results for input(s): VITAMINB12, FOLATE, FERRITIN, TIBC, IRON, RETICCTPCT in the last 72 hours. Urine analysis:    Component Value Date/Time   COLORURINE STRAW (A) 05/24/2017 1831   APPEARANCEUR CLEAR 05/24/2017 1831   LABSPEC 1.005 05/24/2017 1831    PHURINE 7.0 05/24/2017 1831   GLUCOSEU NEGATIVE 05/24/2017 1831   HGBUR NEGATIVE 05/24/2017 1831   BILIRUBINUR NEGATIVE 05/24/2017 1831   BILIRUBINUR n 10/25/2014 1133   KETONESUR NEGATIVE 05/24/2017 1831   PROTEINUR NEGATIVE 05/24/2017 1831   UROBILINOGEN 0.2 10/25/2014 1133   UROBILINOGEN 0.2 06/01/2014 1628   NITRITE NEGATIVE 05/24/2017 1831   LEUKOCYTESUR NEGATIVE 05/24/2017 1831    Radiological Exams on Admission: No results found.  Assessment/Plan Chronic severe hyponatremia with mild to moderate symptoms - Based on history, this is most likely related to medications.  She is on DDAVP chronically.  She is also on HCTZ.  She had added spironolactone recently and her Na went down to 115.  She would be considered chronic as this has been ongoing for > 48 hours.  She is considered severe because Na was < 120.  She only has mild to moderate symptoms of abdominal pain (now resolved).   - She received 1L  NS in the ED, STAT BMET for recheck - Based on her diagnosis, recommendation is to start 3% saline, however, given she has already received NS, I would like to see if her Na is rising.  - Avoid over-correction, attempt to keep </= 47meq in 24 hours - Holding DDAVP currently, if begins correcting too rapidly will add back - If repeat BMET still < 120, will discuss with ICU for 3% saline - BMET q 4 hours to monitor rise in Na - Telemetry   UPDATE Repeat Na is 121 after NS bolus.  Will restart DDAVP at 2/3 previous dose (0.6 qhs), will start 0.2 BID to avoid over correction (already corrected 6 meq in 4 hours).  No need for hypertonic saline and will allow to correct gradually after discontinuing offending medications.   Abdominal pain - She has mildly elevated lipase, could be explained also by hyponatremia - She is currently asymptomatic - Monitor for worsening  Mildly elevated Ca - She is on calcium supplementation, but reports holding this for the last week - Previously normal.    - HCTZ could cause this, but she has been on this medication for a while - STAT repeat BMET ordered - If repeat confirms hypercalcemia, will check PTH  Hypokalemia - Replete with KCL 40 meq, likely related to vomiting and medications  Essential hypertension - BP elevated, and has been elevated at home - Recent change in medications related to this - Patient reports being on amlodipine, hydralazine, hctz, losartan, prazosin and recently spironolactone - Holding hctz, spironolactone - Monitor BP   Interstitial cystitis - She is currently on pentosan and desmopressin - Hold desmopressin    Osteoporosis - Holding calcium supplementation  Asthma - Continue inhalers, symbicort (formulary alternative) and albuterol     DVT prophylaxis: Lovenox Code Status: Full Family Communication: Husband Edison Nasuti at bedside Disposition Plan: Admit for close monitoring of Na, likely d/c in 48 - 72 hours Consults called: None overnight Admission status: Telemetry, inpatient   Gilles Chiquito MD [facility] Pager [phone]  If 7PM-7AM, please contact night-coverage [email or web address] Password [email or web address]  05/24/2017, 9:11 PM

## 2017-05-24 NOTE — ED Notes (Signed)
Admitting at the bedside.  

## 2017-05-24 NOTE — ED Provider Notes (Signed)
[facility] DEPT Provider Note   CSN: [id number] Arrival date & time: 05/24/17  1745     History   Chief Complaint Chief Complaint  Patient presents with  . Abdominal Pain  . Emesis    HPI Brianna Mccarty is a 76 y.o. female.  The history is provided by the patient.  Illness  This is a new problem. The current episode started 2 days ago. The problem occurs daily. The problem has been gradually worsening. Associated symptoms include abdominal pain. Pertinent negatives include no chest pain and no shortness of breath. Associated symptoms comments: Nausea, vomiting. Nothing aggravates the symptoms. Nothing relieves the symptoms. She has tried nothing for the symptoms.    Past Medical History:  Diagnosis Date  . Abnormal EKG    Normal LV function in the past  . Allergic rhinitis   . Asthma   . Bronchitis, mucopurulent recurrent ([place])   . Carotid artery disease ([place])    Doppler, April, 2009, 0-39% bilateral  . Cervical dysplasia 1971  . Chest pain, unspecified   . Diverticulosis of colon   . DJD (degenerative joint disease)   . Ejection fraction    EF 60%, echo, 2009  . GERD (gastroesophageal reflux disease)   . Headache(784.0)   . Hypercholesterolemia   . Hypertension   . Interstitial cystitis    sees urologist  . Lichen sclerosus   . Malignant melanoma ([place])    sees Dr. Nevada Crane in dermatology  . Migraines   . Mitral valve disease    Question mitral valve prolapse in the past, no prolapse by echo 2009  . Murmur 10/20/2015  . Osteoporosis    on fosomax > 5 years, stopped 11/2015  . Renal calculus    sees urologist  . Thyroid cyst    1 x 1.1 thyroid cyst noted on carotid Doppler, January, 2012  . Venous insufficiency     Patient Active Problem List   Diagnosis Date Noted  . Chronic hyponatremia 05/24/2017  . Muscle cramps 11/13/2016  . Snoring 11/13/2016  . H/O cold sores 11/27/2015  . Murmur 10/20/2015  . Carotid artery disease ([place])   . Thyroid cyst     . Venous (peripheral) insufficiency 06/02/2009  . Asthma 04/27/2008  . Melanoma of skin ([place]) 02/10/2008  . Allergic rhinitis 02/10/2008  . INTERSTITIAL CYSTITIS 02/10/2008  . HYPERCHOLESTEROLEMIA 02/09/2008  . Essential hypertension 02/09/2008  . GERD 02/09/2008  . Osteoporosis 02/09/2008    Past Surgical History:  Procedure Laterality Date  . BREAST BIOPSY Left 11/25/2011   U/S core, benign performed at [facility]  . BREAST CYST ASPIRATION    . BREAST SURGERY  2013   Breast Bx-Benign  . CARDIAC CATHETERIZATION    . CATARACT EXTRACTION, BILATERAL    . cystoscopy and basket stone removal right ureter  02/2006   Dr. Terance Hart  . GYNECOLOGIC CRYOSURGERY  1971  . left total hip replacement  2004   Dr. Gladstone Lighter  . melanoma removed from medial rleft knee area  2006   Dr. Nevada Crane  . NASAL SEPTUM SURGERY      OB History    Gravida Para Term Preterm AB Living   2 2 2     2    SAB TAB Ectopic Multiple Live Births                   Home Medications    Prior to Admission medications   Medication Sig Start Date End Date Taking? Authorizing Provider  acyclovir (ZOVIRAX)  400 MG tablet 400mg  tid x5 days for outbreak Patient taking differently: Take 400 mg by mouth See admin instructions. Three times a day for 5 days for outbreaks 11/27/15  Yes Colin Benton R, DO  albuterol (PROAIR HFA) 108 (90 BASE) MCG/ACT inhaler Inhale 2 puffs into the lungs every 6 (six) hours as needed for wheezing. 06/23/15  Yes Noralee Space, MD  albuterol (PROVENTIL) (2.5 MG/3ML) 0.083% nebulizer solution Take 3 mLs (2.5 mg total) by nebulization every 6 (six) hours as needed. 06/23/15 05/24/17 Yes Noralee Space, MD  amLODipine (NORVASC) 10 MG tablet TAKE 1 TABLET BY MOUTH EVERY DAY Patient taking differently: Take 10 mg by mouth once a day 01/20/17  Yes Lucretia Kern, DO  Ascorbic Acid (VITAMIN C) 500 MG tablet Take 500 mg by mouth daily.     Yes [provider]  aspirin 81 MG tablet Take 81 mg by mouth daily.      Yes [provider]  azelastine (ASTELIN) 0.1 % nasal spray Instill 2 sprays into each nostril two times a day as needed for allergies 11/19/16  Yes [provider]  budesonide-formoterol (SYMBICORT) 160-4.5 MCG/ACT inhaler Inhale 2 puffs into the lungs 2 (two) times daily. 11/27/15  Yes Lucretia Kern, DO  Calcium Carbonate (CALCIUM 600) 1500 MG TABS Take 1 tablet by mouth daily.     Yes [provider]  Cholecalciferol (VITAMIN D3) 5000 UNITS CAPS Take 5,000 Units by mouth daily.    Yes [provider]  CINNAMON PO Take 1 capsule by mouth daily.   Yes [provider]  clobetasol cream (TEMOVATE) 1.27 % Apply 1 application topically at bedtime as needed. Patient taking differently: Apply 1 application topically at bedtime as needed (for irritation).  07/03/15  Yes Fontaine, Belinda Block, MD  Coenzyme Q10 (COQ10) 100 MG CAPS Take 1 capsule by mouth daily.     Yes [provider]  desmopressin (DDAVP) 0.2 MG tablet Take 3 tablets by mouth daily at bedtime    Yes [provider]  fexofenadine (ALLEGRA) 180 MG tablet Take 180 mg by mouth daily as needed for allergies or rhinitis.   Yes [provider]  hydrALAZINE (APRESOLINE) 50 MG tablet Take 1 tablet by mouth three times a day Patient taking differently: Take 50 mg by mouth 3 (three) times daily.  05/13/17  Yes Burtis Junes, NP  hydrochlorothiazide (HYDRODIURIL) 25 MG tablet Take 1 tablet (25 mg total) by mouth daily. 11/13/16 05/24/17 Yes Dorothy Spark, MD  hydrOXYzine (ATARAX/VISTARIL) 10 MG tablet Take 10 mg by mouth daily.   Yes [provider]  losartan (COZAAR) 100 MG tablet TAKE 1 TABLET (100 MG TOTAL) BY MOUTH DAILY. 01/20/17  Yes Colin Benton R, DO  magnesium oxide (MAG-OX) 400 MG tablet Take 400 mg by mouth daily.     Yes [provider]  Multiple Vitamins-Minerals (OCUVITE ADULT 50+) CAPS Take 1 capsule by mouth daily.   Yes [provider]   naproxen sodium (ALEVE) 220 MG tablet Take 220-440 mg by mouth 2 (two) times daily as needed (for back pain).   Yes [provider]  omeprazole (PRILOSEC) 40 MG capsule TAKE 1 CAPSULE (40 MG TOTAL) BY MOUTH DAILY. 04/11/17  Yes Colin Benton R, DO  OVER THE COUNTER MEDICATION Bone growth factor capsules: Take 2 capsules by mouth once a day   Yes [provider]  OVER THE COUNTER MEDICATION [place]a: Drink 3 ounces by mouth once a day  to combat inflammation   Yes [provider]  OVER THE COUNTER MEDICATION Juice plus capsule: Take 3 capsules by mouth two times a day (orchard/berry, and vegetable blend)   Yes [provider]  pentosan polysulfate (ELMIRON) 100 MG capsule Take 100 mg by mouth 3 (three) times daily before meals.     Yes [provider]  prazosin (MINIPRESS) 5 MG capsule TAKE ONE CAPSULE BY MOUTH TWICE A DAY Patient taking differently: Take 5 mg by mouth two times a day 01/20/17  Yes Lucretia Kern, DO  Probiotic Product (PROBIOTIC PO) Take 1 capsule by mouth daily.    Yes [provider]  spironolactone (ALDACTONE) 25 MG tablet Take 1 tablet (25 mg total) by mouth daily. 05/21/17 08/19/17 Yes Burtis Junes, NP  Cinnamon 500 MG TABS Take 1 tablet by mouth daily.     [provider]    Family History Family History  Problem Relation Age of Onset  . Cancer Father        Pancreatic  . Diabetes Mother   . Heart disease Mother   . Cancer Brother        Bile duct  . Diabetes Brother   . Hypertension Brother   . Diabetes Brother   . Heart disease Brother   . Hypertension Brother   . Hypertension Sister   . Hypertension Sister   . Colon cancer Paternal Uncle     Social History Social History  Substance Use Topics  . Smoking status: Never Smoker  . Smokeless tobacco: Never Used  . Alcohol use 0.6 oz/week    1 Standard drinks or equivalent per week     Comment: social use     Allergies   Cephalexin;  Penicillins; Phenobarbital; and Tape   Review of Systems Review of Systems  Constitutional: Negative for chills and fever.  HENT: Negative for ear pain and sore throat.   Eyes: Negative for pain and visual disturbance.  Respiratory: Negative for cough and shortness of breath.   Cardiovascular: Negative for chest pain and palpitations.  Gastrointestinal: Positive for abdominal pain, nausea and vomiting. Negative for blood in stool, constipation and diarrhea.  Genitourinary: Negative for dysuria and hematuria.  Musculoskeletal: Negative for arthralgias and back pain.  Skin: Negative for color change and rash.  Allergic/Immunologic: Negative for immunocompromised state.  Neurological: Negative for seizures and syncope.  Hematological: Does not bruise/bleed easily.  Psychiatric/Behavioral: Negative for agitation and behavioral problems.  All other systems reviewed and are negative.    Physical Exam Updated Vital Signs BP (!) 143/76 (BP Location: Right Arm)   Pulse 88   Temp 99.1 F (37.3 C) (Oral)   Resp 18   Ht 5\' 2"  (1.575 m)   Wt 84.4 kg (186 lb 1.6 oz)   SpO2 99%   BMI 34.04 kg/m   Physical Exam  Constitutional: She is oriented to person, place, and time. She appears well-developed and well-nourished. No distress.  HENT:  Head: Normocephalic and atraumatic.  Eyes: Pupils are equal, round, and reactive to light. Conjunctivae and EOM are normal.  Neck: Normal range of motion. Neck supple. No tracheal deviation present.  Cardiovascular: Normal rate and regular rhythm.   No murmur heard. Pulmonary/Chest: Effort normal and breath sounds normal. No respiratory distress. She has no wheezes.  Abdominal: Soft. She exhibits no distension. There is no tenderness. There is no rebound and no guarding.  Musculoskeletal: She exhibits no edema or deformity.  Neurological: She is alert and oriented  to person, place, and time.  Skin: Skin is warm and dry.  Psychiatric: She has a normal  mood and affect.  Nursing note and vitals reviewed.    ED Treatments / Results  Labs (all labs ordered are listed, but only abnormal results are displayed) Labs Reviewed  LIPASE, BLOOD - Abnormal; Notable for the following:       Result Value   Lipase 57 (*)    All other components within normal limits  COMPREHENSIVE METABOLIC PANEL - Abnormal; Notable for the following:    Sodium 115 (*)    Potassium 3.1 (*)    Chloride 80 (*)    Glucose, Bld 115 (*)    Calcium 11.1 (*)    All other components within normal limits  CBC - Abnormal; Notable for the following:    HCT 35.2 (*)    All other components within normal limits  URINALYSIS, ROUTINE W REFLEX MICROSCOPIC - Abnormal; Notable for the following:    Color, Urine STRAW (*)    All other components within normal limits  BASIC METABOLIC PANEL - Abnormal; Notable for the following:    Sodium 121 (*)    Potassium 2.9 (*)    Chloride 86 (*)    Glucose, Bld 116 (*)    Calcium 10.8 (*)    All other components within normal limits  CBC - Abnormal; Notable for the following:    HCT 34.6 (*)    MCHC 36.1 (*)    All other components within normal limits  CBC  BASIC METABOLIC PANEL  BASIC METABOLIC PANEL  BASIC METABOLIC PANEL  BASIC METABOLIC PANEL    EKG  EKG Interpretation None       Radiology No results found.  Procedures Procedures (including critical care time)  Medications Ordered in ED Medications  hydrALAZINE (APRESOLINE) tablet 50 mg (50 mg Oral Given 05/24/17 2143)  pantoprazole (PROTONIX) EC tablet 40 mg (not administered)  amLODipine (NORVASC) tablet 10 mg (not administered)  losartan (COZAAR) tablet 100 mg (not administered)  prazosin (MINIPRESS) capsule 5 mg (5 mg Oral Given 05/24/17 2144)  mometasone-formoterol (DULERA) 200-5 MCG/ACT inhaler 2 puff (not administered)  aspirin EC tablet 81 mg (81 mg Oral Given 05/24/17 2144)  pentosan polysulfate (ELMIRON) capsule 100 mg (not administered)  enoxaparin  (LOVENOX) injection 40 mg (40 mg Subcutaneous Given 05/24/17 2145)  acetaminophen (TYLENOL) tablet 650 mg (not administered)    Or  acetaminophen (TYLENOL) suppository 650 mg (not administered)  multivitamin-lutein (OCUVITE-LUTEIN) capsule 1 capsule (not administered)  albuterol (PROVENTIL) (2.5 MG/3ML) 0.083% nebulizer solution 2.5 mg (not administered)  desmopressin (DDAVP) tablet 0.2 mg (not administered)  ondansetron (ZOFRAN) injection 4 mg (4 mg Intravenous Given 05/24/17 1842)  sodium chloride 0.9 % bolus 1,000 mL (0 mLs Intravenous Stopped 05/24/17 2042)  potassium chloride SA (K-DUR,KLOR-CON) CR tablet 40 mEq (40 mEq Oral Given 05/24/17 2159)     Initial Impression / Assessment and Plan / ED Course  I have reviewed the triage vital signs and the nursing notes.  Pertinent labs & imaging results that were available during my care of the patient were reviewed by me and considered in my medical decision making (see chart for details).     Patient presenting with a few days of abdominal pain and 1 day of nausea and emesis. Nonbloody and nonbilious. Physical exam reveals a nontender, nondistended and soft abdomen. Basic labs do show hyponatremia to 115 which is a significant drop from her last 2 checks.Other labs unremarkable. Patient does  take DDAVP and reports compliance. No neurological symptoms. Given bolus of fluid and Zofran here in the emergency department. Hospitalist consultec at for admission.   Patient will be admitted to medicine for further evaluation of her hyponatremia.  Patient was seen with my attending, Dr. Reather Converse, who voiced agreement and oversaw the evaluation and treatment of this patient.   [employer] seismologist was used in the creation of this note. If there are any errors or inconsistencies needing clarification, please contact me directly.   Final Clinical Impressions(s) / ED Diagnoses   Final diagnoses:  Hyponatremia  Non-intractable vomiting with  nausea, unspecified vomiting type    New Prescriptions Current Discharge Medication List       Valda Lamb, MD 05/24/17 2162    Elnora Morrison, MD 05/24/17 2329

## 2017-05-24 NOTE — ED Notes (Signed)
Report attempted 2nd time 

## 2017-05-24 NOTE — ED Notes (Signed)
Sodium 115 critical lab notification

## 2017-05-25 LAB — BASIC METABOLIC PANEL
ANION GAP: 7 (ref 5–15)
ANION GAP: 8 (ref 5–15)
ANION GAP: 9 (ref 5–15)
ANION GAP: 12 (ref 5–15)
ANION GAP: 7 (ref 5–15)
ANION GAP: 7 (ref 5–15)
Anion gap: 7 (ref 5–15)
BUN: 11 mg/dL (ref 6–20)
BUN: 14 mg/dL (ref 6–20)
BUN: 15 mg/dL (ref 6–20)
BUN: 16 mg/dL (ref 6–20)
BUN: 9 mg/dL (ref 6–20)
BUN: 9 mg/dL (ref 6–20)
BUN: 9 mg/dL (ref 6–20)
CALCIUM: 10 mg/dL (ref 8.9–10.3)
CALCIUM: 10.3 mg/dL (ref 8.9–10.3)
CALCIUM: 9.9 mg/dL (ref 8.9–10.3)
CALCIUM: 9.9 mg/dL (ref 8.9–10.3)
CALCIUM: 10.3 mg/dL (ref 8.9–10.3)
CALCIUM: 10.4 mg/dL — AB (ref 8.9–10.3)
CALCIUM: 10.7 mg/dL — AB (ref 8.9–10.3)
CO2: 25 mmol/L (ref 22–32)
CO2: 26 mmol/L (ref 22–32)
CO2: 27 mmol/L (ref 22–32)
CO2: 27 mmol/L (ref 22–32)
CO2: 28 mmol/L (ref 22–32)
CO2: 28 mmol/L (ref 22–32)
CO2: 30 mmol/L (ref 22–32)
CREATININE: 0.86 mg/dL (ref 0.44–1.00)
CREATININE: 0.83 mg/dL (ref 0.44–1.00)
CREATININE: 1.01 mg/dL — AB (ref 0.44–1.00)
CREATININE: 1.12 mg/dL — AB (ref 0.44–1.00)
Chloride: 87 mmol/L — ABNORMAL LOW (ref 101–111)
Chloride: 87 mmol/L — ABNORMAL LOW (ref 101–111)
Chloride: 87 mmol/L — ABNORMAL LOW (ref 101–111)
Chloride: 87 mmol/L — ABNORMAL LOW (ref 101–111)
Chloride: 87 mmol/L — ABNORMAL LOW (ref 101–111)
Chloride: 88 mmol/L — ABNORMAL LOW (ref 101–111)
Chloride: 89 mmol/L — ABNORMAL LOW (ref 101–111)
Creatinine, Ser: 0.77 mg/dL (ref 0.44–1.00)
Creatinine, Ser: 0.79 mg/dL (ref 0.44–1.00)
Creatinine, Ser: 0.81 mg/dL (ref 0.44–1.00)
GFR calc Af Amer: >60 mL/min (ref >60)
GFR calc Af Amer: >60 mL/min (ref >60)
GFR calc Af Amer: >60 mL/min (ref >60)
GFR calc Af Amer: >60 mL/min (ref >60)
GFR calc Af Amer: 53 mL/min — ABNORMAL LOW (ref >60)
GFR, EST NON AFRICAN AMERICAN: 52 mL/min — AB (ref >60)
GFR, EST NON AFRICAN AMERICAN: 46 mL/min — AB (ref >60)
GLUCOSE: 101 mg/dL — AB (ref 65–99)
GLUCOSE: 141 mg/dL — AB (ref 65–99)
GLUCOSE: 108 mg/dL — AB (ref 65–99)
GLUCOSE: 118 mg/dL — AB (ref 65–99)
GLUCOSE: 107 mg/dL — AB (ref 65–99)
Glucose, Bld: 106 mg/dL — ABNORMAL HIGH (ref 65–99)
Glucose, Bld: 114 mg/dL — ABNORMAL HIGH (ref 65–99)
POTASSIUM: 3.1 mmol/L — AB (ref 3.5–5.1)
POTASSIUM: 3.5 mmol/L (ref 3.5–5.1)
POTASSIUM: 3.5 mmol/L (ref 3.5–5.1)
Potassium: 3.2 mmol/L — ABNORMAL LOW (ref 3.5–5.1)
Potassium: 3 mmol/L — ABNORMAL LOW (ref 3.5–5.1)
Potassium: 2.7 mmol/L — CL (ref 3.5–5.1)
Potassium: 3.4 mmol/L — ABNORMAL LOW (ref 3.5–5.1)
SODIUM: 121 mmol/L — AB (ref 135–145)
SODIUM: 124 mmol/L — AB (ref 135–145)
SODIUM: 126 mmol/L — AB (ref 135–145)
Sodium: 121 mmol/L — ABNORMAL LOW (ref 135–145)
Sodium: 122 mmol/L — ABNORMAL LOW (ref 135–145)
Sodium: 122 mmol/L — ABNORMAL LOW (ref 135–145)
Sodium: 124 mmol/L — ABNORMAL LOW (ref 135–145)

## 2017-05-25 LAB — CBC
HCT: 31 % — ABNORMAL LOW (ref 36.0–46.0)
Hemoglobin: 11.2 g/dL — ABNORMAL LOW (ref 12.0–15.0)
MCH: 28.6 pg (ref 26.0–34.0)
MCHC: 36.1 g/dL — ABNORMAL HIGH (ref 30.0–36.0)
MCV: 79.3 fL (ref 78.0–100.0)
PLATELETS: 234 10*3/uL (ref 150–400)
RBC: 3.91 MIL/uL (ref 3.87–5.11)
RDW: 12.3 % (ref 11.5–15.5)
WBC: 6.8 10*3/uL (ref 4.0–10.5)

## 2017-05-25 LAB — GLUCOSE, CAPILLARY: GLUCOSE-CAPILLARY: 113 mg/dL — AB (ref 65–99)

## 2017-05-25 MED ORDER — POTASSIUM CHLORIDE CRYS ER 20 MEQ PO TBCR
40.0000 meq | EXTENDED_RELEASE_TABLET | Freq: Once | ORAL | Status: AC
  Administered 2017-05-25: 40 meq via ORAL
  Filled 2017-05-25: qty 2

## 2017-05-25 MED ORDER — ACYCLOVIR 400 MG PO TABS
400.0000 mg | ORAL_TABLET | Freq: Three times a day (TID) | ORAL | Status: DC
  Administered 2017-05-25 – 2017-05-26 (×4): 400 mg via ORAL
  Filled 2017-05-25 (×5): qty 1

## 2017-05-25 MED ORDER — PROSIGHT PO TABS
1.0000 | ORAL_TABLET | Freq: Every day | ORAL | Status: DC
  Administered 2017-05-25 – 2017-05-26 (×2): 1 via ORAL
  Filled 2017-05-25 (×2): qty 1

## 2017-05-25 MED ORDER — SODIUM CHLORIDE 1 G PO TABS
1.0000 g | ORAL_TABLET | Freq: Two times a day (BID) | ORAL | Status: DC
  Administered 2017-05-25 – 2017-05-26 (×2): 1 g via ORAL
  Filled 2017-05-25 (×3): qty 1

## 2017-05-25 MED ORDER — ALUM & MAG HYDROXIDE-SIMETH 200-200-20 MG/5ML PO SUSP
30.0000 mL | Freq: Once | ORAL | Status: AC
  Administered 2017-05-25: 30 mL via ORAL
  Filled 2017-05-25: qty 30

## 2017-05-25 NOTE — Progress Notes (Addendum)
PROGRESS NOTE    Brianna Mccarty  EML:[id number] DOB: April 19, 1941 DOA: 05/24/2017 PCP: Lucretia Kern, DO   Brief Narrative:  Addendum:  76 y.o. female with medical history significant of osteoporosis, melanoma s/p excision many years ago, HTN, HLD, GERD, fever blisters, interstitial cystitis, asthma who presented with acute on chronic hyponatremia.   Assessment & Plan:    Chronic hyponatremia - Patient has been on DDAVP and is currently being continued. Patient had decrease in sodium levels on repeat evaluation as such add salt tablets to her regimen. We'll continue to monitor BMPs which are currently ordered every 4 hours  Active Problems:   Essential hypertension - Patient is on losartan and amlodipine. Blood pressures relatively well controlled on this regimen    Asthma - compensated  - albuterol     GERD - Patient is on Protonix, stable    Osteoporosis    H/O cold sores - pt complaining of cold sore, will restart antiviral regimen.  DVT prophylaxis: Lovenox Code Status: Full Family Communication: None at bedside Disposition Plan: pending improvement in sodium levels back to patient's baseline  Consultants:   None  Procedures:  None   Antimicrobials:  Acyclovir  Subjective: Pt has no new complaints. No acute issues overnight.  Objective: Vitals:   05/25/17 0759 05/25/17 1013 05/25/17 1330 05/25/17 1627  BP:  (!) 124/56 (!) 107/49 113/61  Pulse:   87   Resp:   18   Temp:   99 F (37.2 C)   TempSrc:   Oral   SpO2: 95%  94%   Weight:      Height:        Intake/Output Summary (Last 24 hours) at 05/25/17 1712 Last data filed at 05/25/17 0900  Gross per 24 hour  Intake             2240 ml  Output             1350 ml  Net              890 ml   Filed Weights   05/24/17 1750 05/24/17 2102  Weight: 83.9 kg (185 lb) 84.4 kg (186 lb 1.6 oz)    Examination:  General exam: Appears calm and comfortable, in nad. Respiratory system: Clear to  auscultation. Respiratory effort normal. Equal chest rise. Cardiovascular system: S1 & S2 heard, RRR. No JVD, + systolic 2 murmur, no rubs Gastrointestinal system: Abdomen is nondistended, soft and nontender. No organomegaly or masses felt. Normal bowel sounds heard. Central nervous system: Alert and oriented. No focal neurological deficits. Extremities: Symmetric 5 x 5 power. Skin: No rashes, lesions or ulcers, on limited exam Psychiatry:  Mood & affect appropriate.   Data Reviewed: I have personally reviewed following labs and imaging studies  CBC:  Recent Labs Lab 05/24/17 1752 05/24/17 2134 05/25/17 0244  WBC 7.9 6.6 6.8  HGB 12.4 12.5 11.2*  HCT 35.2* 34.6* 31.0*  MCV 80.2 79.9 79.3  PLT 239 221 712   Basic Metabolic Panel:  Recent Labs Lab 05/24/17 2341 05/25/17 0244 05/25/17 0542 05/25/17 0936 05/25/17 1243  NA 121* 124* 126* 121* 122*  K 3.5 3.1* 3.5 3.2* 3.0*  CL 87* 89* 88* 87* 87*  CO2 27 28 26 25 27   GLUCOSE 114* 106* 101* 141* 108*  BUN 9 9 9 11 14   CREATININE 0.77 0.81 0.79 0.86 0.83  CALCIUM 10.7* 10.4* 10.3 10.3 10.0   GFR: Estimated Creatinine Clearance: 57.2 mL/min (by C-G formula based  on SCr of 0.83 mg/dL). Liver Function Tests:  Recent Labs Lab 05/24/17 1752  AST 25  ALT 20  ALKPHOS 50  BILITOT 0.5  PROT 6.7  ALBUMIN 4.2    Recent Labs Lab 05/24/17 1752  LIPASE 57*   No results for input(s): AMMONIA in the last 168 hours. Coagulation Profile: No results for input(s): INR, PROTIME in the last 168 hours. Cardiac Enzymes: No results for input(s): CKTOTAL, CKMB, CKMBINDEX, TROPONINI in the last 168 hours. BNP (last 3 results) No results for input(s): PROBNP in the last 8760 hours. HbA1C: No results for input(s): HGBA1C in the last 72 hours. CBG:  Recent Labs Lab 05/25/17 0805  GLUCAP 113*   Lipid Profile: No results for input(s): CHOL, HDL, LDLCALC, TRIG, CHOLHDL, LDLDIRECT in the last 72 hours. Thyroid Function  Tests: No results for input(s): TSH, T4TOTAL, FREET4, T3FREE, THYROIDAB in the last 72 hours. Anemia Panel: No results for input(s): VITAMINB12, FOLATE, FERRITIN, TIBC, IRON, RETICCTPCT in the last 72 hours. Sepsis Labs: No results for input(s): PROCALCITON, LATICACIDVEN in the last 168 hours.  No results found for this or any previous visit (from the past 240 hour(s)).   Radiology Studies: No results found.  Scheduled Meds: . acyclovir  400 mg Oral Q8H  . amLODipine  10 mg Oral Daily  . aspirin EC  81 mg Oral Daily  . desmopressin  0.2 mg Oral BID  . enoxaparin (LOVENOX) injection  40 mg Subcutaneous Q24H  . hydrALAZINE  50 mg Oral TID  . losartan  100 mg Oral Daily  . mometasone-formoterol  2 puff Inhalation BID  . multivitamin  1 tablet Oral Daily  . pantoprazole  40 mg Oral Daily  . pentosan polysulfate  100 mg Oral TID AC  . potassium chloride  40 mEq Oral Once  . prazosin  5 mg Oral BID   Continuous Infusions:   LOS: 1 day    Time spent: > 35 minutes  Velvet Bathe, MD [facility] Pager [phone]  If 7PM-7AM, please contact night-coverage [email or web address] [employer] [facility] 05/25/2017, 5:12 PM

## 2017-05-25 NOTE — Progress Notes (Signed)
Patient arrived to the unit via from the emergency department. Verified patient arm band.  Patient is alert and oriented x 4. No complaints of pain.  Skin assessment complete.  Birth mark to the left calf.  IV intact to the left antecubital. Vital Signs: Temp: 99.1; BP: 143/76; pulse rate: 88; resp: 18 and 99% on room air.  Placed the patient on cardiac monitoring.   Educated the patient on how reach the staff on the unit .  Lowered the bed and placed the call light within reach.  Will continue to monitor the patient.

## 2017-05-25 NOTE — Progress Notes (Signed)
CRITICAL VALUE ALERT  Critical Value:  K+ 2.7  Date & Time Notied: 05/25/17  17:14  Provider Notified: Dr. Wendee Beavers  Orders Received/Actions taken: Order for PO potassium

## 2017-05-26 LAB — BASIC METABOLIC PANEL
ANION GAP: 8 (ref 5–15)
ANION GAP: 10 (ref 5–15)
BUN: 16 mg/dL (ref 6–20)
BUN: 16 mg/dL (ref 6–20)
CALCIUM: 9.5 mg/dL (ref 8.9–10.3)
CHLORIDE: 91 mmol/L — AB (ref 101–111)
CO2: 26 mmol/L (ref 22–32)
CO2: 25 mmol/L (ref 22–32)
Calcium: 9.5 mg/dL (ref 8.9–10.3)
Chloride: 93 mmol/L — ABNORMAL LOW (ref 101–111)
Creatinine, Ser: 1 mg/dL (ref 0.44–1.00)
Creatinine, Ser: 0.92 mg/dL (ref 0.44–1.00)
GFR, EST NON AFRICAN AMERICAN: 53 mL/min — AB (ref >60)
GFR, EST NON AFRICAN AMERICAN: 59 mL/min — AB (ref >60)
GLUCOSE: 95 mg/dL (ref 65–99)
Glucose, Bld: 117 mg/dL — ABNORMAL HIGH (ref 65–99)
POTASSIUM: 3.7 mmol/L (ref 3.5–5.1)
POTASSIUM: 4.5 mmol/L (ref 3.5–5.1)
SODIUM: 125 mmol/L — AB (ref 135–145)
SODIUM: 128 mmol/L — AB (ref 135–145)

## 2017-05-26 LAB — GLUCOSE, CAPILLARY: GLUCOSE-CAPILLARY: 99 mg/dL (ref 65–99)

## 2017-05-26 MED ORDER — CALCIUM CARBONATE ANTACID 500 MG PO CHEW
1.0000 | CHEWABLE_TABLET | ORAL | Status: DC | PRN
  Administered 2017-05-26: 200 mg via ORAL
  Filled 2017-05-26: qty 1

## 2017-05-26 NOTE — Progress Notes (Signed)
Pt given discharge instructions, prescriptions, and care notes by nursing student and instructor. Pt verbalized understanding AEB no further questions or concerns at this time. IV was discontinued, no redness, pain, or swelling noted at this time. Pt left the floor via wheelchair with staff in stable condition.

## 2017-05-26 NOTE — Discharge Summary (Signed)
Physician Discharge Summary  LAWRENCE ROLDAN FWY:[id number] DOB: 03-Apr-1941 DOA: 05/24/2017  PCP: Lucretia Kern, DO  Admit date: 05/24/2017 Discharge date: 05/26/2017  Time spent: > 35 minutes  Recommendations for Outpatient Follow-up:  1. Monitor blood pressures and adjust blood pressure medication accordingly. Spironolactone held on discharge secondary to normal blood pressures off of it. 2. Monitor sodium levels hydrochlorothiazide discontinued secondary to worsening hyponatremia   Discharge Diagnoses:  Active Problems:   Essential hypertension   Asthma   GERD   INTERSTITIAL CYSTITIS   Osteoporosis   H/O cold sores   Chronic hyponatremia   Discharge Condition: stable  Diet recommendation: heart healthy  Filed Weights   05/24/17 1750 05/24/17 2102  Weight: 83.9 kg (185 lb) 84.4 kg (186 lb 1.6 oz)    History of present illness:  76 y.o.femalewith medical history significant of osteoporosis, melanoma s/p excision many years ago, HTN, HLD, GERD, fever blisters, interstitial cystitis, asthma who presented with acute on chronic hyponatremia.  Hospital Course:    Chronic hyponatremia - Patient DDAVP and acute hyponatremia resolving with increased sodium in diet and by holding HCTZ. Will continue to hold HCTZ.   Active Problems:   Essential hypertension - Patient is on losartan and amlodipine. Blood pressures relatively well controlled on this regimen as such will continue on discharge. Will hold spironolactone as patient's bp's controlled on agents listed.    Asthma - compensated, albuterol     GERD - Patient is on Protonix, stable    Osteoporosis    H/O cold sores - pt complaining of cold sore, will restart antiviral regimen  Procedures:  None  Consultations:  None  Discharge Exam: Vitals:   05/26/17 0942 05/26/17 1354  BP: 123/67 (!) 126/51  Pulse:  70  Resp:  17  Temp:  (!) 97.5 F (36.4 C)    General: Pt in nad, alert and  awake Cardiovascular: rrr, no rubs Respiratory: no increased wob, no wheezes  Discharge Instructions   Discharge Instructions    Call MD for:  redness, tenderness, or signs of infection (pain, swelling, redness, odor or green/yellow discharge around incision site)    Complete by:  As directed    Call MD for:  temperature >100.4    Complete by:  As directed    Diet - low sodium heart healthy    Complete by:  As directed    Increase activity slowly    Complete by:  As directed      Current Discharge Medication List    CONTINUE these medications which have NOT CHANGED   Details  acyclovir (ZOVIRAX) 400 MG tablet 400mg  tid x5 days for outbreak Qty: 15 tablet, Refills: 3    albuterol (PROAIR HFA) 108 (90 BASE) MCG/ACT inhaler Inhale 2 puffs into the lungs every 6 (six) hours as needed for wheezing. Qty: 3 Inhaler, Refills: 0    albuterol (PROVENTIL) (2.5 MG/3ML) 0.083% nebulizer solution Take 3 mLs (2.5 mg total) by nebulization every 6 (six) hours as needed. Qty: 360 mL, Refills: 0    amLODipine (NORVASC) 10 MG tablet TAKE 1 TABLET BY MOUTH EVERY DAY Qty: 90 tablet, Refills: 1    Ascorbic Acid (VITAMIN C) 500 MG tablet Take 500 mg by mouth daily.      aspirin 81 MG tablet Take 81 mg by mouth daily.      azelastine (ASTELIN) 0.1 % nasal spray Instill 2 sprays into each nostril two times a day as needed for allergies Refills: 5  budesonide-formoterol (SYMBICORT) 160-4.5 MCG/ACT inhaler Inhale 2 puffs into the lungs 2 (two) times daily. Qty: 3 Inhaler, Refills: 3    Calcium Carbonate (CALCIUM 600) 1500 MG TABS Take 1 tablet by mouth daily.      Cholecalciferol (VITAMIN D3) 5000 UNITS CAPS Take 5,000 Units by mouth daily.     !! CINNAMON PO Take 1 capsule by mouth daily.    clobetasol cream (TEMOVATE) 5.80 % Apply 1 application topically at bedtime as needed. Qty: 30 g, Refills: 1    Coenzyme Q10 (COQ10) 100 MG CAPS Take 1 capsule by mouth daily.      desmopressin  (DDAVP) 0.2 MG tablet Take 3 tablets by mouth daily at bedtime     fexofenadine (ALLEGRA) 180 MG tablet Take 180 mg by mouth daily as needed for allergies or rhinitis.    hydrALAZINE (APRESOLINE) 50 MG tablet Take 1 tablet by mouth three times a day Qty: 360 tablet, Refills: 1    hydrOXYzine (ATARAX/VISTARIL) 10 MG tablet Take 10 mg by mouth daily.    losartan (COZAAR) 100 MG tablet TAKE 1 TABLET (100 MG TOTAL) BY MOUTH DAILY. Qty: 90 tablet, Refills: 1    magnesium oxide (MAG-OX) 400 MG tablet Take 400 mg by mouth daily.      Multiple Vitamins-Minerals (OCUVITE ADULT 50+) CAPS Take 1 capsule by mouth daily.    omeprazole (PRILOSEC) 40 MG capsule TAKE 1 CAPSULE (40 MG TOTAL) BY MOUTH DAILY. Qty: 90 capsule, Refills: 1    pentosan polysulfate (ELMIRON) 100 MG capsule Take 100 mg by mouth 3 (three) times daily before meals.      prazosin (MINIPRESS) 5 MG capsule TAKE ONE CAPSULE BY MOUTH TWICE A DAY Qty: 180 capsule, Refills: 1    Probiotic Product (PROBIOTIC PO) Take 1 capsule by mouth daily.     !! Cinnamon 500 MG TABS Take 1 tablet by mouth daily.      !! - Potential duplicate medications found. Please discuss with provider.    STOP taking these medications     hydrochlorothiazide (HYDRODIURIL) 25 MG tablet      naproxen sodium (ALEVE) 220 MG tablet      OVER THE COUNTER MEDICATION      OVER THE COUNTER MEDICATION      OVER THE COUNTER MEDICATION      spironolactone (ALDACTONE) 25 MG tablet        Allergies  Allergen Reactions  . Cephalexin     Reaction was a high fever  . Penicillins Hives and Swelling    Swelling of arms & face Has patient had a PCN reaction causing immediate rash, facial/tongue/throat swelling, SOB or lightheadedness with hypotension: Yes Has patient had a PCN reaction causing severe rash involving mucus membranes or skin necrosis: No Has patient had a PCN reaction that required hospitalization: No Has patient had a PCN reaction occurring  within the last 10 years: No If all of the above answers are "NO", then may proceed with Cephalosporin use.   Marland Kitchen Phenobarbital Hives  . Tape Rash      The results of significant diagnostics from this hospitalization (including imaging, microbiology, ancillary and laboratory) are listed below for reference.    Significant Diagnostic Studies: No results found.  Microbiology: No results found for this or any previous visit (from the past 240 hour(s)).   Labs: Basic Metabolic Panel:  Recent Labs Lab 05/25/17 1243 05/25/17 1616 05/25/17 2113 05/26/17 0105 05/26/17 0939  NA 122* 122* 124* 125* 128*  K 3.0*  2.7* 3.4* 3.7 4.5  CL 87* 87* 87* 91* 93*  CO2 27 28 30 26 25   GLUCOSE 108* 118* 107* 117* 95  BUN 14 15 16 16 16   CREATININE 0.83 1.01* 1.12* 1.00 0.92  CALCIUM 10.0 9.9 9.9 9.5 9.5   Liver Function Tests:  Recent Labs Lab 05/24/17 1752  AST 25  ALT 20  ALKPHOS 50  BILITOT 0.5  PROT 6.7  ALBUMIN 4.2    Recent Labs Lab 05/24/17 1752  LIPASE 57*   No results for input(s): AMMONIA in the last 168 hours. CBC:  Recent Labs Lab 05/24/17 1752 05/24/17 2134 05/25/17 0244  WBC 7.9 6.6 6.8  HGB 12.4 12.5 11.2*  HCT 35.2* 34.6* 31.0*  MCV 80.2 79.9 79.3  PLT 239 221 234   Cardiac Enzymes: No results for input(s): CKTOTAL, CKMB, CKMBINDEX, TROPONINI in the last 168 hours. BNP: BNP (last 3 results) No results for input(s): BNP in the last 8760 hours.  ProBNP (last 3 results) No results for input(s): PROBNP in the last 8760 hours.  CBG:  Recent Labs Lab 05/25/17 0805 05/26/17 0756  GLUCAP 113* 99    Signed:  Velvet Bathe MD.  [facility] 05/26/2017, 2:42 PM

## 2017-05-27 NOTE — Consult Note (Signed)
           [facility] CM Primary Care Navigator  05/27/2017  SHIREEN RAYBURN [birth year] [id number]   Attempt to see patientearlier at the bedsideto identify possible discharge needs but she was alreadydischarged.  Patient was discharged home yesterdayper staff report.  Primary care provider's officecalled (Sheena)to notify of patient's discharge and need for post hospital follow-up and transition of care. Reminded of patient's health issues needing follow-up as well.  Made aware to refer patient to [facility] care management ifdeemed appropriatefor services.    For questions, please contact:  Dannielle Huh, BSN, RN- [facility] Primary Care Navigator  Telephone: [phone] [place]

## 2017-05-28 ENCOUNTER — Telehealth: Payer: Self-pay

## 2017-05-28 NOTE — Telephone Encounter (Signed)
D/C 05/26/17 To: home  Spoke with pt's husband, Timmothy Sours, and he states that pt is doing very well. He reports that she "has had a very good day today" and that she is currently taking a nap. He states that they do not have any questions or concerns at this time.  Pt is scheduled to see Dr. Maudie Mercury on 05/30/17, however husband reports that they will be out of town. He will have pt call office to reschedule.   [facility] would like to be considered for any necessary referrals that pt may need.  Dr. Corky Mull   Transition Care Management Follow-up Telephone Call  How have you been since you were released from the hospital? Very good, improving   Do you understand why you were in the hospital? yes   Do you understand the discharge instrcutions? yes  Items Reviewed:  Medications reviewed: yes  Allergies reviewed: yes  Dietary changes reviewed: yes  Referrals reviewed: yes   Functional Questionnaire:   Activities of Daily Living (ADLs):   She states they are independent in the following: ambulation, bathing and hygiene, feeding, continence, grooming, toileting and dressing States they require assistance with the following: none   Any transportation issues/concerns?: no   Any patient concerns? no   Confirmed importance and date/time of follow-up visits scheduled: yes   Confirmed with patient if condition begins to worsen call PCP or go to the ER.  Patient was given the Call-a-Nurse line [phone]: yes

## 2017-05-30 ENCOUNTER — Ambulatory Visit: Payer: PPO | Admitting: Family Medicine

## 2017-06-02 ENCOUNTER — Ambulatory Visit (INDEPENDENT_AMBULATORY_CARE_PROVIDER_SITE_OTHER): Payer: PPO | Admitting: Cardiology

## 2017-06-02 ENCOUNTER — Encounter (INDEPENDENT_AMBULATORY_CARE_PROVIDER_SITE_OTHER): Payer: Self-pay

## 2017-06-02 ENCOUNTER — Encounter: Payer: Self-pay | Admitting: Cardiology

## 2017-06-02 VITALS — BP 128/70 | HR 72 | Ht 62.0 in | Wt 190.1 lb

## 2017-06-02 DIAGNOSIS — E871 Hypo-osmolality and hyponatremia: Secondary | ICD-10-CM

## 2017-06-02 DIAGNOSIS — I25119 Atherosclerotic heart disease of native coronary artery with unspecified angina pectoris: Secondary | ICD-10-CM | POA: Diagnosis not present

## 2017-06-02 NOTE — Progress Notes (Signed)
06/02/2017 Brianna Mccarty   1941/09/30  [id number]  Primary Physician Brianna Kern, DO Primary Cardiologist: Dr. Meda Mccarty   Reason for Visit/CC: f/u for HTN  HPI:  Brianna Mccarty is a 76 y.o. female, previously followed by Dr. Ron Mccarty and now followed by Dr. Meda Mccarty, with a h/o HTN, HLD, mild AS by echo 01/2017, chronic DD, normal LVEF at 60-65%, and carotid artery disease with minimal plaque on carotid US in 05/2015. Most recently, NST 01/2017 was low risk.   Most recently, she has had issues with increase in BP. She was seen by Brianna Merle, NP, 05/13/17 for this. BP had been running high and patient noted increased life stressors and admitted to eating out frequently. Her BP day of visit was 160/90. Little improvement at home after increasing the dose of her Hydralazine. Spironolactone was added to her regimen, however after initiation, she was admitted to the hospital for acute on chronic hyponatremia. Her presented with nausea and vomiting. No mental status changes. Na level dropped to 115.  HCTZ and spironolactone were discontinued and condition improved. Na level day of discharge was 128. Her BP has since been treated with amlodipine, losartan and HCTZ.  Today in clinic, her BP is well controlled at 128/70. She denies any mental status changes. No further n/v. She also denies CP and dyspnea. She is due for repeat carotid artery dopplers. She denies dizziness, syncope/ near syncope. She also needs another sleep study. Dr. Meda Mccarty ordered this to assess for OSA, however she was unable to complete the study. She only got a total of 30 min of sleep that night, per the patient.   Of note, Her last lipid panel 01/23/17 showed LDL at 112 mg/DL.     No outpatient prescriptions have been marked as taking for the 06/02/17 encounter (Office Visit) with Brianna Pandy, PA-C.   Allergies  Allergen Reactions  . Cephalexin     Reaction was a high fever  . Penicillins Hives and Swelling    Swelling  of arms & face Has patient had a PCN reaction causing immediate rash, facial/tongue/throat swelling, SOB or lightheadedness with hypotension: Yes Has patient had a PCN reaction causing severe rash involving mucus membranes or skin necrosis: No Has patient had a PCN reaction that required hospitalization: No Has patient had a PCN reaction occurring within the last 10 years: No If all of the above answers are "NO", then may proceed with Cephalosporin use.   Marland Kitchen Phenobarbital Hives  . Tape Rash   Past Medical History:  Diagnosis Date  . Abnormal EKG    Normal LV function in the past  . Allergic rhinitis   . Asthma   . Bronchitis, mucopurulent recurrent ([place])   . Carotid artery disease ([place])    Doppler, April, 2009, 0-39% bilateral  . Cervical dysplasia 1971  . Chest pain, unspecified   . Diverticulosis of colon   . DJD (degenerative joint disease)   . Ejection fraction    EF 60%, echo, 2009  . GERD (gastroesophageal reflux disease)   . Headache(784.0)   . Hypercholesterolemia   . Hypertension   . Interstitial cystitis    sees urologist  . Lichen sclerosus   . Malignant melanoma ([place])    sees Brianna Mccarty in dermatology  . Migraines   . Mitral valve disease    Question mitral valve prolapse in the past, no prolapse by echo 2009  . Murmur 10/20/2015  . Osteoporosis    on fosomax >  5 years, stopped 11/2015  . Renal calculus    sees urologist  . Thyroid cyst    1 x 1.1 thyroid cyst noted on carotid Doppler, January, 2012  . Venous insufficiency    Family History  Problem Relation Age of Onset  . Cancer Father        Pancreatic  . Diabetes Mother   . Heart disease Mother   . Cancer Brother        Bile duct  . Diabetes Brother   . Hypertension Brother   . Diabetes Brother   . Heart disease Brother   . Hypertension Brother   . Hypertension Sister   . Hypertension Sister   . Colon cancer Paternal Uncle    Past Surgical History:  Procedure Laterality Date  . BREAST  BIOPSY Left 11/25/2011   U/S core, benign performed at Brianna Mccarty  . BREAST CYST ASPIRATION    . BREAST SURGERY  2013   Breast Bx-Benign  . CARDIAC CATHETERIZATION    . CATARACT EXTRACTION, BILATERAL    . cystoscopy and basket stone removal right ureter  02/2006   Dr. Terance Mccarty  . GYNECOLOGIC CRYOSURGERY  1971  . left total hip replacement  2004   Dr. Gladstone Mccarty  . melanoma removed from medial rleft knee area  2006   Brianna Mccarty  . NASAL SEPTUM SURGERY     Social History   Social History  . Marital status: Married    Spouse name: Brianna Mccarty  . Number of children: 2  . Years of education: N/A   Occupational History  . retired Retired   Social History Main Topics  . Smoking status: Never Smoker  . Smokeless tobacco: Never Used  . Alcohol use 0.6 oz/week    1 Standard drinks or equivalent per week     Comment: social use  . Drug use: No  . Sexual activity: No     Comment: 1st intercourse 61 yo-1 partner   Other Topics Concern  . Not on file   Social History Narrative   Updated 11/2015   Work or School: none      Home Situation: lives with husband      Spiritual Beliefs: christian      Lifestyle: regular exercise; healthy diet           Review of Systems: General: negative for chills, fever, night sweats or weight changes.  Cardiovascular: negative for chest pain, dyspnea on exertion, edema, orthopnea, palpitations, paroxysmal nocturnal dyspnea or shortness of breath Dermatological: negative for rash Respiratory: negative for cough or wheezing Urologic: negative for hematuria Abdominal: negative for nausea, vomiting, diarrhea, bright red blood per rectum, melena, or hematemesis Neurologic: negative for visual changes, syncope, or dizziness All other systems reviewed and are otherwise negative except as noted above.   Physical Exam:  Height 5\' 2"  (1.575 m), weight 190 lb 1.9 oz (86.2 kg).  General appearance: alert, cooperative and no distress Neck: no carotid bruit and no  JVD Lungs: clear to auscultation bilaterally Heart: regular rate and rhythm and 2/6 SM, loudest at RUSB Extremities: extremities normal, atraumatic, no cyanosis or edema Pulses: 2+ and symmetric Skin: Skin color, texture, turgor normal. No rashes or lesions Neurologic: Grossly normal  EKG not preformed -- personally reviewed   ASSESSMENT AND PLAN:   1. Hyponatremia: Na level dropped to 115, in the setting of diuretic use with HCTZ and spironolactone for management of HTN. Both meds were discontinued and Na levels improved by time of discharge back to  128 . We will repeat a BMP today to reassess levels. Avoid further use of diuretics, if possible.  2. HTN: well controlled on current regimen of CCB, ARB and hydralazine.   3. Bilateral Carotid Artery Disease: minimal plaque on carotid US in 05/2015. She is due for repeat study. We will arrange for repeat dopplers.   4. Aortic Stenosis: mild by recent echo 01/2017. She denies CP and dyspnea.   5. OSA: needs sleep study as outlined above. We will order sleep study.    PLAN  F/u with Dr. Meda Mccarty in 6 months    Burlene Montecalvo Ladoris Gene, MHS [facility] 06/02/2017 2:43 PM

## 2017-06-02 NOTE — Patient Instructions (Addendum)
Medication Instructions:  Your physician recommends that you continue on your current medications as directed. Please refer to the Current Medication list given to you today.   Labwork: Your physician recommends that you return for lab work today for BMET  Testing/Procedures: Your physician has recommended that you have a sleep study. This test records several body functions during sleep, including: brain activity, eye movement, oxygen and carbon dioxide blood levels, heart rate and rhythm, breathing rate and rhythm, the flow of air through your mouth and nose, snoring, body muscle movements, and chest and belly movement.   Your physician has requested that you have a carotid duplex. This test is an ultrasound of the carotid arteries in your neck. It looks at blood flow through these arteries that supply the brain with blood. Allow one hour for this exam. There are no restrictions or special instructions.         Follow-Up: Your physician wants you to follow-up in: 6 months with Dr. Meda Coffee. You will receive a reminder letter in the mail two months in advance. If you don't receive a letter, please call our office to schedule the follow-up appointment.   Any Other Special Instructions Will Be Listed Below (If Applicable).     If you need a refill on your cardiac medications before your next appointment, please call your pharmacy.

## 2017-06-03 ENCOUNTER — Telehealth: Payer: Self-pay | Admitting: *Deleted

## 2017-06-03 DIAGNOSIS — G4733 Obstructive sleep apnea (adult) (pediatric): Secondary | ICD-10-CM

## 2017-06-03 LAB — BASIC METABOLIC PANEL
BUN / CREAT RATIO: 22 (ref 12–28)
BUN: 14 mg/dL (ref 8–27)
CHLORIDE: 103 mmol/L (ref 96–106)
CO2: 23 mmol/L (ref 20–29)
Calcium: 9.3 mg/dL (ref 8.7–10.3)
Creatinine, Ser: 0.63 mg/dL (ref 0.57–1.00)
GFR calc non Af Amer: 87 mL/min/{1.73_m2} (ref >59)
GFR, EST AFRICAN AMERICAN: 100 mL/min/{1.73_m2} (ref >59)
Glucose: 104 mg/dL — ABNORMAL HIGH (ref 65–99)
POTASSIUM: 4.5 mmol/L (ref 3.5–5.2)
Sodium: 141 mmol/L (ref 134–144)

## 2017-06-03 NOTE — Telephone Encounter (Signed)
Informed patient of upcoming sleep study and patient understanding was verbalized. Patient understands her sleep study is scheduled for Tuesday July 29 2017. Patient understands her sleep study will be done at [facility] sleep lab. Patient understands she will receive a sleep packet in a week or so. Patient understands to call if she does not receive the sleep packet in a timely manner. Patient agrees with treatment and thanked me for call.

## 2017-06-04 DIAGNOSIS — J301 Allergic rhinitis due to pollen: Secondary | ICD-10-CM | POA: Diagnosis not present

## 2017-06-04 DIAGNOSIS — J3089 Other allergic rhinitis: Secondary | ICD-10-CM | POA: Diagnosis not present

## 2017-06-05 DIAGNOSIS — M48061 Spinal stenosis, lumbar region without neurogenic claudication: Secondary | ICD-10-CM | POA: Diagnosis not present

## 2017-06-05 DIAGNOSIS — M5416 Radiculopathy, lumbar region: Secondary | ICD-10-CM | POA: Diagnosis not present

## 2017-06-05 DIAGNOSIS — J301 Allergic rhinitis due to pollen: Secondary | ICD-10-CM | POA: Diagnosis not present

## 2017-06-05 DIAGNOSIS — J3089 Other allergic rhinitis: Secondary | ICD-10-CM | POA: Diagnosis not present

## 2017-06-05 NOTE — Telephone Encounter (Signed)
Patient called back to say she would like a Home Sleep study. In lab split night study cancelled today 06/05/2017 and home sleep ordered.

## 2017-06-11 ENCOUNTER — Ambulatory Visit (HOSPITAL_COMMUNITY)
Admission: RE | Admit: 2017-06-11 | Discharge: 2017-06-11 | Disposition: A | Discharge status: Home / Self Care | Payer: PPO | Source: Ambulatory Visit | Attending: Cardiology | Admitting: Cardiology

## 2017-06-11 DIAGNOSIS — I6523 Occlusion and stenosis of bilateral carotid arteries: Secondary | ICD-10-CM | POA: Diagnosis not present

## 2017-06-11 DIAGNOSIS — I25119 Atherosclerotic heart disease of native coronary artery with unspecified angina pectoris: Secondary | ICD-10-CM | POA: Diagnosis not present

## 2017-06-11 DIAGNOSIS — J3089 Other allergic rhinitis: Secondary | ICD-10-CM | POA: Diagnosis not present

## 2017-06-11 DIAGNOSIS — J301 Allergic rhinitis due to pollen: Secondary | ICD-10-CM | POA: Diagnosis not present

## 2017-06-17 DIAGNOSIS — J3089 Other allergic rhinitis: Secondary | ICD-10-CM | POA: Diagnosis not present

## 2017-06-17 DIAGNOSIS — J301 Allergic rhinitis due to pollen: Secondary | ICD-10-CM | POA: Diagnosis not present

## 2017-06-25 DIAGNOSIS — J3089 Other allergic rhinitis: Secondary | ICD-10-CM | POA: Diagnosis not present

## 2017-06-25 DIAGNOSIS — J301 Allergic rhinitis due to pollen: Secondary | ICD-10-CM | POA: Diagnosis not present

## 2017-06-27 DIAGNOSIS — J301 Allergic rhinitis due to pollen: Secondary | ICD-10-CM | POA: Diagnosis not present

## 2017-06-27 DIAGNOSIS — J3089 Other allergic rhinitis: Secondary | ICD-10-CM | POA: Diagnosis not present

## 2017-06-27 NOTE — Telephone Encounter (Signed)
Informed patient of upcoming home sleep study and patient understanding was verbalized. Patient understands her sleep study will be done at home through [facility]. Patient understands she will receive a call in a week or so. Patient understands to call if she does not receive that call in a timely manner. Patient agrees with treatment and thanked me for call.

## 2017-07-01 DIAGNOSIS — J3089 Other allergic rhinitis: Secondary | ICD-10-CM | POA: Diagnosis not present

## 2017-07-01 DIAGNOSIS — J301 Allergic rhinitis due to pollen: Secondary | ICD-10-CM | POA: Diagnosis not present

## 2017-07-01 DIAGNOSIS — N3010 Interstitial cystitis (chronic) without hematuria: Secondary | ICD-10-CM | POA: Diagnosis not present

## 2017-07-04 DIAGNOSIS — J301 Allergic rhinitis due to pollen: Secondary | ICD-10-CM | POA: Diagnosis not present

## 2017-07-04 DIAGNOSIS — J3089 Other allergic rhinitis: Secondary | ICD-10-CM | POA: Diagnosis not present

## 2017-07-08 ENCOUNTER — Ambulatory Visit: Payer: PPO | Admitting: Pulmonary Disease

## 2017-07-08 DIAGNOSIS — J301 Allergic rhinitis due to pollen: Secondary | ICD-10-CM | POA: Diagnosis not present

## 2017-07-08 DIAGNOSIS — J3089 Other allergic rhinitis: Secondary | ICD-10-CM | POA: Diagnosis not present

## 2017-07-11 ENCOUNTER — Other Ambulatory Visit: Payer: Self-pay | Admitting: Family Medicine

## 2017-07-15 ENCOUNTER — Other Ambulatory Visit: Payer: Self-pay | Admitting: Family Medicine

## 2017-07-22 ENCOUNTER — Ambulatory Visit: Payer: PPO | Admitting: Pulmonary Disease

## 2017-07-29 ENCOUNTER — Encounter (HOSPITAL_BASED_OUTPATIENT_CLINIC_OR_DEPARTMENT_OTHER): Payer: PPO

## 2017-08-11 DIAGNOSIS — Z23 Encounter for immunization: Secondary | ICD-10-CM | POA: Diagnosis not present

## 2017-08-11 DIAGNOSIS — J3089 Other allergic rhinitis: Secondary | ICD-10-CM | POA: Diagnosis not present

## 2017-08-11 DIAGNOSIS — J301 Allergic rhinitis due to pollen: Secondary | ICD-10-CM | POA: Diagnosis not present

## 2017-08-11 NOTE — Telephone Encounter (Signed)
Checked the [facility] portal for patients sleep study and found a note that the patient cancelled her study 06/27/17 until November. She stated she would call [facility] back to reschedule then.

## 2017-08-13 ENCOUNTER — Ambulatory Visit (INDEPENDENT_AMBULATORY_CARE_PROVIDER_SITE_OTHER): Payer: PPO | Admitting: Ophthalmology

## 2017-08-19 DIAGNOSIS — J301 Allergic rhinitis due to pollen: Secondary | ICD-10-CM | POA: Diagnosis not present

## 2017-08-19 DIAGNOSIS — J3089 Other allergic rhinitis: Secondary | ICD-10-CM | POA: Diagnosis not present

## 2017-08-28 DIAGNOSIS — J3089 Other allergic rhinitis: Secondary | ICD-10-CM | POA: Diagnosis not present

## 2017-08-28 DIAGNOSIS — J301 Allergic rhinitis due to pollen: Secondary | ICD-10-CM | POA: Diagnosis not present

## 2017-09-03 DIAGNOSIS — J301 Allergic rhinitis due to pollen: Secondary | ICD-10-CM | POA: Diagnosis not present

## 2017-09-03 DIAGNOSIS — J3089 Other allergic rhinitis: Secondary | ICD-10-CM | POA: Diagnosis not present

## 2017-09-09 ENCOUNTER — Ambulatory Visit: Payer: PPO | Admitting: Pulmonary Disease

## 2017-09-10 ENCOUNTER — Encounter: Payer: Self-pay | Admitting: Pulmonary Disease

## 2017-09-10 ENCOUNTER — Ambulatory Visit: Payer: PPO | Admitting: Pulmonary Disease

## 2017-09-10 VITALS — BP 122/72 | HR 57 | Temp 97.5°F | Ht 63.0 in | Wt 180.4 lb

## 2017-09-10 DIAGNOSIS — J301 Allergic rhinitis due to pollen: Secondary | ICD-10-CM

## 2017-09-10 DIAGNOSIS — J4520 Mild intermittent asthma, uncomplicated: Secondary | ICD-10-CM

## 2017-09-10 DIAGNOSIS — Z8709 Personal history of other diseases of the respiratory system: Secondary | ICD-10-CM

## 2017-09-10 MED ORDER — BUDESONIDE-FORMOTEROL FUMARATE 160-4.5 MCG/ACT IN AERO: 2.0000 | INHALATION_SPRAY | Freq: Two times a day (BID) | RESPIRATORY_TRACT | 3 refills | Status: DC

## 2017-09-10 NOTE — Progress Notes (Signed)
Subjective:    Patient ID: Brianna Mccarty, female    DOB: 05-06-1941, 76 y.o.   MRN: [id number]  HPI 76 y/o WF here for a follow up visit... she has hx refractory asthmatic bronchitis requiring hosp & IV Solumedrol & max bronchodilator Rx etc... in the outpt setting she gets frequent courses of Prednisone & we have endeavored to maximize her inhaled regimen so as to keep her out of the hosp & off the oral steroids as much as poss... she also sees DrESL/VanWinkle for allergy shots, and she has seen DrKozlow in the past who felt that she had a major component of reflux causing her refractory airways dis...  SEE PREV EPIC NOTES FOR EARLIER DATA >>  ~  April 22, 2012:  76moROV & FZafirahcontinues to do satis from the pulm standpoint w/o exacerbation;  She had left breast biopsy 1/13 that turned out benign- prob fibroadenoma;  She continues on Nopalea supplement w/ it's anti-inflamm properties;  She saw DrFontaine 27/82>hx lichen sclerosis- uses Temovate cream & doing well, he deferred Osteoporosis testing & follow up to u[place]& she is noted to be on both Evista & Alendronate; she wants to stop the Evista due to expense & this is ok w/ me;  She also saw DrKatz for Cards f/u 5/13> doing well & no changes made...     We reviewed prob list, meds, xrays and labs> see below>>  ~  October 21, 2012:  677moOV & Brianna Mccarty notes doing well w/o recent issues, no new complaints or concerns... We reviewed the following medical problems during today's office visit >> reminded to bring all meds to every office visit...    AR, Asthma, Bronchitis> on NEBS w/ Albut, Symbicort80, Allegra180; she has last Asthma exac 8/13- treated by DrSharma w/ Depo/ Pred/ ZPak/ & incr Symbicort to 160; she improved & ret to baseline but didn't bring meds to this OV...    HBP, HxCP> on ASA81, Amlod10, Losar100, Minipress5, Lasix40;  BP=144/80 & she denies CP, palpit, SOB, edema; [facility] f/u HBP, CP, SOB; doing satis & no  changes made...    Cerebrovasc dis> on ASA81; she had f/u CDopplers 1/12 showing stable mild heterogeneous plaque in bulbs & 0-39% ICA stenoses (incidental 1cm right thyroid cyst).    VenInsuffic> on low sodium, elevation, support hose...    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    GU- IC, KidStones> on DDAVP & Elmiron100Tid; she is s/p DMSO treatments; states voiding is fine, denies recent leakage, incont, etc; last stone was 2007...    DJD, Osteopenia> on Norco5, Alendronate70/wk, calcium, MVI, VitD; she is s/p leftTHR by DrGioffre2004; [facility]    HAs> she is s/p eval in HA clinics 1994 & 2003...    HxMelanoma> [facility] medial left [facility] We reviewed prob list, meds, xrays and labs> see below for updates >> she had the 2013 Flu vaccine 10/13; she has requested refill prescriptions today...  LABS 12/13:  FLP- at goals on Simva20;  Chems- wnl;  CBC- wnl;  TSH=2.43;  VitD=41   ~  May 18, 2013:  77mo77mo &  Brianna Mccarty has had a good interval- mild allergy symptoms in the spring, doing well overall & they cut back her allergy shots to every other wk; she still credits much of her improvement to Nopolea juice that she drinks daily for the last 19yr- "it's good for inflammation & helps my asthma"...     Asthma is stable on Symbicort80- taking one puff Bid & ProventilHFA vs NEB prn...     BP looks good on Amlod10, Minipress5Bid, Cozaar100, Lasix40; BP= 128/72 & she denies CP, palpit, dizzy, SOB, edema...    Chol has been well regulated on Simva20, RYR, CoQ10 w/ FLP 12/13 within parameters...    GI is stable on Prev30Bid & probiotic daily, followed by DrStark...    GU remains stable on Elmiron &  DDAVP; followed by DrPeterson & Fontaine... We reviewed prob list, meds, xrays and labs> see below for updates >>   ~  December 06, 2013:  [id number] & Brianna Mccarty a number of questions today> notes freq fever blisters & wants rx (Acyclovir prn); wants Prevnar-13 (OK); wants CXR & BMD (ordered); has mole on her back & we will refer to Derm for excision; wonders about a CRP test (we discussed this); wants to change her PPI to Protonix (OK)... We reviewed the following medical problems during today's office visit >>     AR, Asthma, Bronchitis> on NEBS w/ Albut, Symbicort80, Allegra180; she has last Asthma exac 12/14- treated by TP w/ Depo/ Pred/ ZPak/ & incr Symbicort etc; she improved & ret to baseline but didn't bring meds to this OV...    HBP, HxCP> on ASA81, Amlod10, Losar100, Minipress5, Lasix40;  BP=138/72 & she denies CP, palpit, SOB, edema; [facility], CP, SOB; doing satis & no changes made...    Cerebrovasc dis> on ASA81; she had f/u CDopplers 4/14 showing stable mild heterogeneous plaque in bulbs & 0-39% ICA stenoses (incidental 1cm right thyroid cyst).    VenInsuffic> on low sodium, elevation, support hose...    Chol> on Simva20; FLP 2/15 shows TChol 164, TG 43, HDL 62, LDL 94    GI- HH, GERD, Divertics> on Prev30bid & probiotic daily; she denies abd pain, dysphagia, n/v, c/d, blood seen; [place] by DrStark w/ divertics only, but there is a FamHx colon ca in uncle, therefore f/u 5y19yr.    GU- IC, KidStones> on DDAVP & Elmiron100Tid; she is s/p DMSO treatments; states voiding is fine, denies recent leakage, incont, etc; last stone was 2007...    DJD, Osteopenia> on Norco5, Alendronate70/wk, calcium, MVI, VitD; she is s/p leftTHR by DrGioffre2004; last BMD 1/12 showed [facility]    HAs> she is s/p eval in HA clinics 1994 & 2003...    HxMelanoma> [facility] medial left [facility] We reviewed prob list, meds, xrays and labs> see  below for updates >>   CXR 2/15 showed norm heart size, chronic changes w/ incr markings and blunt right angle, NAD  PFTs 2/15 showed FVC=2.38 (91%), FEV1=1.56 (79%), %1sec=65%, mid-flows=38% predicted... c/w GOLD Stage1 COPD  BMD 2/15 showed TScores +0.3 in Spine and -1.8 in femNeck (improved on the Alendronate)...  LABS 2/15:  FLP at goals on Simva20;  Chems- wnl;  CBC- wnl;  TSH= 2.55;  VitD= 48   ~  June 27, 2015:  73m28mo & Brianna Mccarty me that she's had a good yr, feeling well, no new complaints or concerns; she proudly reports that her grandson graduated from  [employer] & is now in helicopter school;  We reviewed the following medical problems during today's office visit >>     AR, Asthma, Bronchitis> on NEBS w/ Albut (ave <1/mo), Symbicort160Bid, OTC antihist prn; she denies recent asthma exac, hasn't required antibiotics, pred, etc; stable DOE, denies cough/ sput/ hemoptysis/ CP/ etc; states juice plus helps    HBP, HxCP> on ASA81, Amlod10, Losar100, Minipress5, Lasix40;  BP=130/74 & she denies CP, palpit, SOB, edema; followed by DrKatz (last 5/13) for HBP, CP, SOB; doing satis & no changes made...    Cerebrovasc dis> on ASA81; she had f/u CDopplers 7/16 showing stable mild heterogeneous plaque in bulbs & 1-39% Bilat ICA stenoses (incidental 1cm right thyroid cyst).    VenInsuffic> on low sodium, elevation, support hose...    Chol> on Simva20; [place] 12/15 shows TChol 185, TG 37, HDL 71, LDL 107    GI- HH, GERD, Divertics> on Prilosec40 & probiotic daily; she denies abd pain, dysphagia, n/v, c/d, blood seen; known [place], f/u colon 4/16 by DrStark w/ divertics & Gr1 hems...     GU- IC, KidStones> on DDAVP & Elmiron100Tid; she is s/p DMSO treatments; states voiding is fine, denies recent leakage, incont, etc; last stone was 2007...    DJD, Osteopenia> on Norco5, Alendronate70/wk, calcium, MVI, VitD; she is s/p leftTHR by DrGioffre2004; last BMD 1/12 showed TScore -2.3 in [facility]...    HAs>  she is s/p eval in HA clinics 1994 & 2003...    HxMelanoma> removed from medial left knee in 2006 by DrHall... EXAM shows Afeb, VSS, O2sat=97% on RA;  HEENT- neg;  Chest- clear w/o w/r/r;  Heart- RR gr1-2/6 SEM w/o r/g;  Abd- soft, neg;  Ext- w/o c/c/e;  Neuro- intact... We reviewed prob list, meds, xrays and labs> see below for updates >> Her new PCP is DrHKim...  CDoppler 7/16 showed bilat heterogeneous plaque, stable 1-39% bilat ICAstenoses, norm subclav & patent vertebrals w/ antegrade flow (f/u 32yr)...  2DEcho 8/16 showed norm LVF w/ EF=60-65%, no regional wall motion abn, AV mildly thickened & calcif (sclerosis w/o stenosis), mitral annular calcif w/o MR/MS, LA dil at 582m no ASD or PFO, norm RV & PA... ?why the LA dil?  ~  July 03, 2016:  Yearly ROV & pulmonary recheck>  She remains on SYMBICORT160-2spBid regularly, Albuterol via NEB or HFA as needed (she reports zero use over the last 6+months), and allergy shots per DrVanWinkle;  She reports that breathing is good- denies cough, sput, hemoptysis, SOB, CP, etc; she has not had an interval URI, bronchitic infection or worse;  We gave her the 2017 flu shot today & she is otherw up-to-date on immunizations; she has been under some stress w/ her daughters health issues and personal problems...    FrJoaquim LaiPCP is DrHKim, [place] office> seen 05/30/16 w/ hx of Asthma, allergies, HBP, carotid art dis, HL, GERD, IC & kidney stones, osteoporosis, hx melanoma- stable, no changes made to her meds...    She saw DrVanWinkle 04/19/16 for Allergy f/u visit> AR, asthma, acid reflux; stable on allergy shots x 30+yrs + Allegra, Flonase, Patanase; stable 7 continued on her same regimen...    She is followed by DrNelson for Cards- seen 10/20/15> HBP, ?MVP in past- no MVP on Echo but LAdil= 5325mabn EKG w/ LAD, carotid art dis, HL; she changed Simva20 to PRAV20 due to symptoms & she is overdue for f/u FLP on the new med=> she will contact DrKim for this; AND  they decreased  her Lasix40=>20/d...     She tells me that she has had incr LBP, saw DrGioffre & treated w/ Pred 04/2016=> improved...  EXAM shows Afeb, VSS, O2sat=96% on RA;  Wt=191#;  HEENT- neg, mallampati2;  Chest- clear w/o w/r/r;  Heart- RR gr1-2/6 SEM w/o r/g;  Abd- soft, neg;  Ext- w/o c/c/e;  Neuro- intact... We reviewed prob list, meds, xrays and labs>  IMP/PLAN>>  Brianna Mccarty is stable on her Symbicort160-2spBid + her rescue inhaler prn;  REC to continue same, call for any problems, & we plan routine ROV 38yr..   ~  September 10, 2017:  122moOV & Brianna Mccarty for yearly pulmonary ROV- feeling well, doing satis w/o new complaints or concerns... She remains on SYMBICORT160-2spBid, NEBS w/ Albut vs ProairHFA as needed, Allegra180 & Astelin nasal spray per DrHulen Lusteror allergy;  She denies interval asthma attacks or exacerbations;  She remains on weekly allergy shots;  She has continues her supplemental regimen w/ NOPALEA "It stabilizes me" she says...     Sleep study 12/13/16 in Lab per DrTurner was inadeq w/ only 37 min sleep time & showed AHI=4.8 w/ 3 hypopneasa and 14 RERAs (sl worse supine) but she did not get to REM, mod snoring, O2 desat to 87% for 17 min    Her PCP is DrHKim, seen 03/2017>  Note reviewed- hx anxiety/depression, HBP, HL, carotid dis, VI, asthma/ allergies, GERD, osteoporosis, IC, hx melanoma;     Cards- DrNelson saw pt on 06/02/17>  HBP, HL, mild AS by Echo, chr DD; on Amlod10, Losar100, Minipress5Bid, Apres50Tid, off prev Lasix due to hx hyponatremia w/ sodium down to 115 in past...    DrTurner had sched another Sleep Study but pt cancelled & wanted a Home Sleep Test => pending... EXAM shows Afeb, VSS, O2sat=98% on RA;  Wt=181#;  HEENT- neg, mallampati2;  Chest- clear w/o w/r/r;  Heart- RR gr1-2/6 SEM w/o r/g;  Abd- soft, neg;  Ext- w/o c/c/e;  Neuro- intact... We reviewed prob list, meds, xrays and labs>  IMP/PLAN>>  Good job w/ wt reduction, BP is improved w/ meds, asthma  stable on current regimen including allergy shots from DrVanWinkle...           Problem List:  ALLERGIC RHINITIS (ICD-477.9) ASTHMA (ICD-493.90) - she takes ALLEGRA 18097m, SYMBICORT 80- 1spBid, PROAIR & NEBS w/ Albuterol per DrESL; in addition she [facility], & he treats her w/ freq courses of Pred for exacerbations... ~  CXR 6/09 was clear...  ~  CXR 2/10 hosp showed mild peribronch thickening, NAD... ~  PFT 3/10 showed FVC=2.36 (90%), FEV1=1.78 (85%), %1sec=75, mid-flows= 57%pred. ~  4/10: she saw DrKozlow for second opinion & Rx of her AB & GERD... ~  CXR 2/11 showed clear lungs, NAD... f/u 7/11= same (clear, NAD)... Marland Kitchen  RAST testing 5/11 showed IgE= 130, & neg rast tests x cockroach +low titer. ~  CXR 7/11 showed mild cardiomeg, clear lungs, NAD. ~  Sinus CT 8/11 showed no signif inflamm dis, mild membrane thickening, min ethmoid changes on right. ~  PFT 8/11 showed FVC=2.04 (71%), FEV1=1.58 (73%), %1sec=78, mid-flows= 70%pred. ~  11/12:  She reports no recent infectious exac & doing better on a new supplement "nopalea" which she says reduces inflamm. ~  12/13: on NEBS w/ Albut, Symbicort80, Allegra180; she has last Asthma exac 8/13- treated by DrSharma w/ Depo/ Pred/ ZPak/ & incr Symbicort to 160; she improved & ret to baseline but didn't bring  meds to this OV... ~  7/14: Asthma is stable on Symbicort80- taking one puff Bid & ProventilHFA vs NEB prn  BRONCHITIS, RECURRENT (ICD-491.9) - hx freq infectious exac... ~  CTChest 2008 w/ 66m nodule in RUL- unchanged over 661monterval, small HH, noncalcif pleural plaques bilat... she is reassured w/ the recent CXR & serial films not showing nodule. ~  no recent resp infections but she likes to keep ZPak handy just in case.  HYPERTENSION (ICD-401.9) &  CHEST PAIN-UNSPECIFIED (ICD-786.50) - controlled on 4 meds:  NORVASC 1066m, LOSARTAN 100m12m MINIPRESS 5mgB53m & LASIX 40mg/26m ~  2DEcho 8/09  showed norm LV wall motion & EF= 60-65%, mild incr AoV thickness, mild LA dil & atrial septal aneurysm noted. ~  EKG 9/10 by DrKatz- NSR, WNL... ~  2DEcho 9/10 showed norm LV wall motion & thickness, EF= 60-65%, gr I DD noted, sl dil LA, right side OK...  ~  9/11:  BP= 130/84, and BP's at home are even better- 120's/ 60's per pt... denies HA, visual changes, CP, palipit, dizziness, syncope, edema, etc... ~  1/12:  She had f/u appt DrKatz- stable, no changes made... ~  5/12:  BP= 122/74 and stable on meds... ~  11/12:  BP= 126/74 & similar at home she says... ~  5/13:  She saw DrKatz for Cards f/u> EKG showed NSR, rate76, poor R progression, NAD; stable & doing well- no changes made... ~  6/13:  BP= 138/78 & she is feeling well; denies CP, palpit, SOB, edema, etc... ~  12/13:  on ASA81, Amlod10, Losar100, Minipress5, Lasix40;  BP=144/80 & she denies CP, palpit, SOB, edema; [facility] for f/u HBP, CP, SOB; doing satis & no changes made... ~  7/14: BP looks good on Amlod10, Minipress5Bid, Cozaar100, Lasix40; BP= 128/72 & she denies CP, palpit, dizzy, SOB, edema.  CEREBROVASCULAR DISEASE (ICD-437.9) - on ASA 81mg/d50mshe had an abn lifeline screen 3/08 w/ left Carotid in the mild/mod range... ~  CDopplers 4/09 showed mild smooth heterogen plaque in bulbs, 0-39% ICA stenoses... ~  f/u CDoppler 1/12 by DrKatz Benay Spice mild heterogen plaque in bulbs, 0-39% bilat ICAstenoses... 1cm thyr cyst noted- not palpated.  VENOUS INSUFFICIENCY (ICD-459.81) - she knows to avoid sodium, elevate legs, wear support hose, continue the Lasix...  HYPERCHOLESTEROLEMIA (ICD-272.0) - on SIMVASTATIN 20mg/d 62mQ10 daily... prev intol to statins w/ severe leg cramps but the addition of the CoQ10 has made a world of difference to her tolerability... ~  FLP 3/08[place] diet alone showed TChol 268, TG 35, HDL 96, LDL 166... ~  FLP 9/08[place] Crestor 5mg/d sh28md TChol 212, TG 37, HDL 73, LDL 127... continue Rx. ~   FLP 12/09[place] TChol 179, TG 38, HDL 78, LDL 93... continue same Rx. ~  FLP 2/11 on [place] showed TChol 161, TG 42, HDL 70, LDL 83... pt switched to Simva20 per request. ~  FLP 9/11 [place] showed TChol 209, TG 42, HDL 72, LDL 127... Same med, better diet, get wt down. ~  FLP 11/12[place] Simva20 showed TChol 168, TG 35, HDL 67, LDL 94 ~  FLP 12/13 on simva20 showed TChol 152, TG 34, HDL 64, LDL 81   GERD (ICD-530.81) - on PREVACID 30mgBid +64m antireflux regimen Qhs. ~  last EGD 6/08 by DrSam showed 4cmHH, gasMon[place], and stricture- dilated... prev HPylori neg in 2007.  DIVERTICULOSIS OF COLON (ICD-562.10) ~  colonoscopy 3/05 by DrSam showed divertics only... f/u planned 68yrs (+fam58yr  hx in uncle). ~  colonoscopy 9/10 by DrStark showed divertics only... he plans f/u 42yr.  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 she has had DMSO in bladder + taking> ELMIRON 1060mid, & DDAVP 0.19m68mtabsHS (off prev Vesicare)... last DMSO treatment ~5/11 per pt. ~  Note:  She is followed by DrFontaine for GYN...  RENAL CALCULUS, HX OF (ICD-V13.01) - required cysto & basket stone removal from right ureter in 2007...  DEGENERATIVE JOINT DISEASE (ICD-715.90) - she is s/p left THR by DrGioffre in 2004...  OSTEOPOROSIS (ICD-733.00) - on ALENDRONATE 19m60m started 1/12; + Calcium, MVI, VitD; prev on Evista & this was stopped 6/13... ~  labs 12/09 showed Vit D level = 34... advised to start Vit D OTC 1-2,000 u daily... ~  BMD 1/12 showed TScores -0.1 in Spine, and -2.[facility] recommended starting FOSAMAX 19mg319malong w/ her Calcium, MVI, VitD 5000u (esp in light of her freq Pred use). ~  6/13:  Pt wanted to know if it was ok to stop her Evista since it is so expensive; she saw DrFontaine & he deferred to us- oKoreato stop Evista. ~  BMD 2/15 showed TScores +0.3 in Spine and -1.8 in [facility] and rec to continue the Alendronate, calcium, MVI & VitD the same for now...  HEADACHE  (ICD-784.0) - prev eval at the Headache clinic- 1994 by DrSpillman, and 2003 by DrFreeman...  Hx of MALIGNANT MELANOMA (ICD-172.9) - [id number] from medial left knee area...  Health Maintenance - she gets the seasonal Flu vaccine yearly... given f/u PNEUMOVAX at age 74 [id number]...   Past Surgical History:  Procedure Laterality Date  . BREAST BIOPSY Left 11/25/2011   [facility]    . BREAST SURGERY  2013   Breast Bx-Benign  . CARDIAC CATHETERIZATION    . CATARACT EXTRACTION, BILATERAL    . cystoscopy and basket stone removal right ureter  02/2006   Dr. PeterTerance HartYNECOLOGIC CRYOSURGERY  1971  . left total hip replacement  2004   Dr. GioffGladstone Lighterelanoma removed from medial rleft knee area  2006   Dr. Hall [place] CraneASAL SEPTUM SURGERY      Outpatient Encounter Medications as of 09/10/2017  Medication Sig  . acyclovir (ZOVIRAX) 400 MG tablet Take 400 mg by mouth as needed (BREAKOUTS).  . albMarland Kitchenterol (PROAIR HFA) 108 (90 BASE) MCG/ACT inhaler Inhale 2 puffs into the lungs every 6 (six) hours as needed for wheezing.  . amLMarland KitchenDipine (NORVASC) 10 MG tablet TAKE 1 TABLET BY MOUTH EVERY DAY  . Ascorbic Acid (VITAMIN C) 500 MG tablet Take 500 mg by mouth daily.    . aspMarland Kitchenrin 81 MG tablet Take 81 mg by mouth daily.    . azeMarland Kitchenastine (ASTELIN) 0.1 % nasal spray Instill 2 sprays into each nostril two times a day as needed for allergies  . budesonide-formoterol (SYMBICORT) 160-4.5 MCG/ACT inhaler Inhale 2 puffs 2 (two) times daily into the lungs.  . Calcium Carbonate (CALCIUM 600) 1500 MG TABS Take 1 tablet by mouth daily.    . Cholecalciferol (VITAMIN D3) 5000 UNITS CAPS Take 5,000 Units by mouth daily.   . Cinnamon 500 MG TABS Take 1 tablet by mouth daily.   . CINMarland KitchenAMON PO Take 1 capsule by mouth daily.  . clobetasol cream (TEMOVATE) 0.05 4.50ply 1 application topically as needed (IRRITATION).  . Coenzyme Q10 (COQ10) 100 MG CAPS  Take 1  capsule by mouth daily.    Marland Kitchen desmopressin (DDAVP) 0.2 MG tablet Take 3 tablets by mouth daily at bedtime   . fexofenadine (ALLEGRA) 180 MG tablet Take 180 mg by mouth daily as needed for allergies or rhinitis.  . hydrALAZINE (APRESOLINE) 50 MG tablet Take 1 tablet by mouth three times a day  . hydrOXYzine (ATARAX/VISTARIL) 10 MG tablet Take 10 mg by mouth daily.  Marland Kitchen losartan (COZAAR) 100 MG tablet TAKE 1 TABLET (100 MG TOTAL) BY MOUTH DAILY.  . magnesium oxide (MAG-OX) 400 MG tablet Take 400 mg by mouth daily.    . Multiple Vitamins-Minerals (OCUVITE ADULT 50+) CAPS Take 1 capsule by mouth daily.  Marland Kitchen omeprazole (PRILOSEC) 40 MG capsule TAKE 1 CAPSULE (40 MG TOTAL) BY MOUTH DAILY.  Marland Kitchen pentosan polysulfate (ELMIRON) 100 MG capsule Take 100 mg by mouth 3 (three) times daily before meals.    . pravastatin (PRAVACHOL) 20 MG tablet Take 20 mg daily by mouth.  . prazosin (MINIPRESS) 5 MG capsule TAKE ONE CAPSULE BY MOUTH TWICE A DAY  . Probiotic Product (PROBIOTIC PO) Take 1 capsule by mouth daily.   . [DISCONTINUED] budesonide-formoterol (SYMBICORT) 160-4.5 MCG/ACT inhaler Inhale 2 puffs into the lungs 2 (two) times daily.  Marland Kitchen albuterol (PROVENTIL) (2.5 MG/3ML) 0.083% nebulizer solution Take 3 mLs (2.5 mg total) by nebulization every 6 (six) hours as needed.   No facility-administered encounter medications on file as of 09/10/2017.     Allergies  Allergen Reactions  . Cephalexin     Reaction was a high fever  . Penicillins Hives and Swelling    Swelling of arms & face Has patient had a PCN reaction causing immediate rash, facial/tongue/throat swelling, SOB or lightheadedness with hypotension: Yes Has patient had a PCN reaction causing severe rash involving mucus membranes or skin necrosis: No Has patient had a PCN reaction that required hospitalization: No Has patient had a PCN reaction occurring within the last 10 years: No If all of the above answers are "NO", then may proceed with Cephalosporin  use.   Marland Kitchen Phenobarbital Hives  . Tape Rash    Immunization History  Administered Date(s) Administered  . Influenza Split 09/18/2011, 08/11/2012, 07/28/2014  . Influenza Whole 07/21/2008, 08/04/2009, 08/01/2010  . Influenza, High Dose Seasonal PF 07/19/2013, 07/28/2015, 09/01/2017  . Influenza,inj,Quad PF,6+ Mos 07/03/2016  . Pneumococcal Conjugate-13 12/06/2013  . Pneumococcal Polysaccharide-23 10/03/2008  . Tdap 11/04/2008, 10/24/2015  . Zoster 11/05/2003    Current Medications, Allergies, Past Medical History, Past Surgical History, Family History, and Social History were reviewed in [employer] record.   Review of Systems         See HPI - all other systems neg except as noted... The patient complains of dyspnea on exertion.  The patient denies anorexia, fever, weight loss, weight gain, vision loss, decreased hearing, hoarseness, chest pain, syncope, peripheral edema, prolonged cough, headaches, hemoptysis, abdominal pain, melena, hematochezia, severe indigestion/heartburn, hematuria, incontinence, muscle weakness, suspicious skin lesions, transient blindness, difficulty walking, depression, unusual weight change, abnormal bleeding, enlarged lymph nodes, and angioedema.     Objective:   Physical Exam      WD, Overweight, 76 y/o WF in NAD... Vital Signs:  Reviewed... GENERAL:  Alert & oriented; pleasant & cooperative... HEENT:  [place]/AT, EOM-wnl, PERRLA, EACs-clear, TMs- some wax, NOSE-clear, THROAT- resolved, clear now. NECK:  Supple w/ fairROM; no JVD; normal carotid impulses w/o bruits; no thyromegaly or nodules palpated; no lymphadenopathy. CHEST:  essentially clear- without wheezing,  rales, or signs of consolidation. HEART:  Regular Rhythm; Gr1-2/6 SEM w/o rubs or gallops detected... ABDOMEN:  Obese, soft & nontender; normal bowel sounds; no organomegaly or masses palpated... EXT: without deformities, mild arthritic changes; no varicose veins/ +venous  insuffic/ tr edema. NEURO:  CN's intact;  no focal neuro deficits... DERM:  No lesions noted; no rash etc...  RADIOLOGY DATA:  Reviewed in the EPIC EMR & discussed w/ the patient...  LABORATORY DATA:  Reviewed in the EPIC EMR & discussed w/ the patient...   Assessment & Plan:    09/10/17>   Good job w/ wt reduction, BP is improved w/ meds, asthma stable on current regimen including allergy shots from DrVanWinkle   ASTHMA>  On regimen as above, she indicates doing well & hasn't needed Pred in yrs due to her supplements- "noplea" "juice plus" etc...  HBP>  Controlled on meds, continue same, get wt down...  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  CHOL> on Simva20, has added RYR she says, & FLP looks good...  GI>  Hx GERD stable on PPI Rx.  IC>  Followed by Urology & stable by her report...  DJD/ OSteopenia>  Now on Fosamax along w/ her Calcium, MVI, Vit D...  Other medical problems as noted...     Medication List        Accurate as of 09/10/17  3:33 PM. Always use your most recent med list.          acyclovir 400 MG tablet Commonly known as:  ZOVIRAX   * albuterol 108 (90 Base) MCG/ACT inhaler Commonly known as:  PROAIR HFA Inhale 2 puffs into the lungs every 6 (six) hours as needed for wheezing.   * albuterol (2.5 MG/3ML) 0.083% nebulizer solution Commonly known as:  PROVENTIL Take 3 mLs (2.5 mg total) by nebulization every 6 (six) hours as needed.   amLODipine 10 MG tablet Commonly known as:  NORVASC TAKE 1 TABLET BY MOUTH EVERY DAY   aspirin 81 MG tablet   azelastine 0.1 % nasal spray Commonly known as:  ASTELIN   budesonide-formoterol 160-4.5 MCG/ACT inhaler Commonly known as:  SYMBICORT Inhale 2 puffs 2 (two) times daily into the lungs.   CALCIUM 600 1500 (600 Ca) MG Tabs tablet Generic drug:  calcium carbonate   Cinnamon 500 MG Tabs   clobetasol cream 0.05 % Commonly  known as:  TEMOVATE   CoQ10 100 MG Caps   desmopressin 0.2 MG tablet Commonly known as:  DDAVP   fexofenadine 180 MG tablet Commonly known as:  ALLEGRA   hydrALAZINE 50 MG tablet Commonly known as:  APRESOLINE Take 1 tablet by mouth three times a day   hydrOXYzine 10 MG tablet Commonly known as:  ATARAX/VISTARIL   losartan 100 MG tablet Commonly known as:  COZAAR TAKE 1 TABLET (100 MG TOTAL) BY MOUTH DAILY.   magnesium oxide 400 MG tablet Commonly known as:  MAG-OX   OCUVITE ADULT 50+ Caps   omeprazole 40 MG capsule Commonly known as:  PRILOSEC TAKE 1 CAPSULE (40 MG TOTAL) BY MOUTH DAILY.   pentosan polysulfate 100 MG capsule Commonly known as:  ELMIRON   pravastatin 20 MG tablet Commonly known as:  PRAVACHOL   prazosin 5 MG capsule Commonly known as:  MINIPRESS TAKE ONE CAPSULE BY MOUTH TWICE A DAY   PROBIOTIC PO   vitamin C 500 MG tablet Commonly known as:  ASCORBIC ACID   Vitamin  D3 5000 units Caps      * This list has 2 medication(s) that are the same as other medications prescribed for you. Read the directions carefully, and ask your doctor or other care provider to review them with you.          Where to Get Your Medications    These medications were sent to [id number] [place], [place], [place] [address]  Phone:  [phone]  budesonide-formoterol 160-4.5 MCG/ACT inhaler

## 2017-09-10 NOTE — Patient Instructions (Signed)
Today we updated your med list in our EPIC system...    Continue your current medications the same...  We refilled your Symbicort per request...  Keep up the good work w/ diet, exercise, wt reduction!  Call for any questions or if we can be of service in any way.Marland KitchenMarland Kitchen

## 2017-09-15 ENCOUNTER — Encounter (INDEPENDENT_AMBULATORY_CARE_PROVIDER_SITE_OTHER): Payer: PPO | Admitting: Ophthalmology

## 2017-09-15 DIAGNOSIS — H35341 Macular cyst, hole, or pseudohole, right eye: Secondary | ICD-10-CM

## 2017-09-15 DIAGNOSIS — H353122 Nonexudative age-related macular degeneration, left eye, intermediate dry stage: Secondary | ICD-10-CM

## 2017-09-15 DIAGNOSIS — H43813 Vitreous degeneration, bilateral: Secondary | ICD-10-CM

## 2017-09-15 DIAGNOSIS — H33303 Unspecified retinal break, bilateral: Secondary | ICD-10-CM | POA: Diagnosis not present

## 2017-09-15 DIAGNOSIS — D3132 Benign neoplasm of left choroid: Secondary | ICD-10-CM | POA: Diagnosis not present

## 2017-09-15 DIAGNOSIS — I10 Essential (primary) hypertension: Secondary | ICD-10-CM

## 2017-09-15 DIAGNOSIS — D3131 Benign neoplasm of right choroid: Secondary | ICD-10-CM

## 2017-09-15 DIAGNOSIS — H35033 Hypertensive retinopathy, bilateral: Secondary | ICD-10-CM | POA: Diagnosis not present

## 2017-09-15 DIAGNOSIS — H353111 Nonexudative age-related macular degeneration, right eye, early dry stage: Secondary | ICD-10-CM | POA: Diagnosis not present

## 2017-09-23 DIAGNOSIS — J301 Allergic rhinitis due to pollen: Secondary | ICD-10-CM | POA: Diagnosis not present

## 2017-09-23 DIAGNOSIS — J3089 Other allergic rhinitis: Secondary | ICD-10-CM | POA: Diagnosis not present

## 2017-09-24 ENCOUNTER — Other Ambulatory Visit: Payer: Self-pay | Admitting: Family Medicine

## 2017-10-02 DIAGNOSIS — J3089 Other allergic rhinitis: Secondary | ICD-10-CM | POA: Diagnosis not present

## 2017-10-02 DIAGNOSIS — J301 Allergic rhinitis due to pollen: Secondary | ICD-10-CM | POA: Diagnosis not present

## 2017-10-07 ENCOUNTER — Other Ambulatory Visit: Payer: Self-pay | Admitting: Family Medicine

## 2017-10-08 DIAGNOSIS — J3089 Other allergic rhinitis: Secondary | ICD-10-CM | POA: Diagnosis not present

## 2017-10-08 DIAGNOSIS — J301 Allergic rhinitis due to pollen: Secondary | ICD-10-CM | POA: Diagnosis not present

## 2017-10-08 DIAGNOSIS — H6123 Impacted cerumen, bilateral: Secondary | ICD-10-CM | POA: Diagnosis not present

## 2017-10-10 ENCOUNTER — Other Ambulatory Visit: Payer: Self-pay | Admitting: Family Medicine

## 2017-10-10 ENCOUNTER — Encounter: Payer: Self-pay | Admitting: Cardiology

## 2017-10-10 DIAGNOSIS — G4733 Obstructive sleep apnea (adult) (pediatric): Secondary | ICD-10-CM | POA: Diagnosis not present

## 2017-10-11 DIAGNOSIS — G4733 Obstructive sleep apnea (adult) (pediatric): Secondary | ICD-10-CM | POA: Diagnosis not present

## 2017-10-12 ENCOUNTER — Other Ambulatory Visit: Payer: Self-pay | Admitting: Family Medicine

## 2017-10-13 DIAGNOSIS — G4733 Obstructive sleep apnea (adult) (pediatric): Secondary | ICD-10-CM | POA: Diagnosis not present

## 2017-10-17 ENCOUNTER — Other Ambulatory Visit: Payer: Self-pay | Admitting: Family Medicine

## 2017-10-20 DIAGNOSIS — J3089 Other allergic rhinitis: Secondary | ICD-10-CM | POA: Diagnosis not present

## 2017-10-20 DIAGNOSIS — J301 Allergic rhinitis due to pollen: Secondary | ICD-10-CM | POA: Diagnosis not present

## 2017-10-22 ENCOUNTER — Ambulatory Visit (INDEPENDENT_AMBULATORY_CARE_PROVIDER_SITE_OTHER): Payer: PPO

## 2017-10-22 VITALS — BP 122/60 | HR 65 | Ht 62.4 in | Wt 186.0 lb

## 2017-10-22 DIAGNOSIS — Z0000 Encounter for general adult medical examination without abnormal findings: Secondary | ICD-10-CM | POA: Diagnosis not present

## 2017-10-22 NOTE — Patient Instructions (Addendum)
Brianna Mccarty , Thank you for taking time to come for your Medicare Wellness Visit. I appreciate your ongoing commitment to your health goals. Please review the following plan we discussed and let me know if I can assist you in the future.    Shingrix is a vaccine for the prevention of Shingles in Adults 50 and older.  If you are on Medicare, you can request a prescription from your doctor to be filled at a pharmacy.  Please check with your benefits regarding applicable copays or out of pocket expenses.  The Shingrix is given in 2 vaccines approx 8 weeks apart. You must receive the 2nd dose prior to 6 months from receipt of the first.   These are the goals we discussed: Goals    . Exercise 150 min/wk Moderate Activity     More exercise and lose more weight Will cut calories after the holidays   Check out  online nutrition programs as GumSearch.nl and http://vang.com/; fit54m; Look for foods with "whole" wheat; bran; oatmeal etc Shot at the farmer's markets in season for fresher choices  Watch for "hydrogenated" on the label of oils which are trans-fats.  Watch for "high fructose corn syrup" in snacks, yogurt or ketchup  Meats have less marbling; bright colored fruits and vegetables;  Canned; dump out liquid and wash vegetables. Be mindful of what we are eating  Portion control is essential to a health weight! Sit down; take a break and enjoy your meal; take smaller bites; put the fork down between bites;  It takes 20 minutes to get full; so check in with your fullness cues and stop eating when you start to fill full              This is a list of the screening recommended for you and due dates:  Health Maintenance  Topic Date Due  . Colon Cancer Screening  02/07/2020  . Tetanus Vaccine  10/23/2025  . Flu Shot  Completed  . DEXA scan (bone density measurement)  Completed  . Pneumonia vaccines  Completed      Fall Prevention in the Home Falls can cause injuries. They  can happen to people of all ages. There are many things you can do to make your home safe and to help prevent falls. What can I do on the outside of my home?  Regularly fix the edges of walkways and driveways and fix any cracks.  Remove anything that might make you trip as you walk through a door, such as a raised step or threshold.  Trim any bushes or trees on the path to your home.  Use bright outdoor lighting.  Clear any walking paths of anything that might make someone trip, such as rocks or tools.  Regularly check to see if handrails are loose or broken. Make sure that both sides of any steps have handrails.  Any raised decks and porches should have guardrails on the edges.  Have any leaves, snow, or ice cleared regularly.  Use sand or salt on walking paths during winter.  Clean up any spills in your garage right away. This includes oil or grease spills. What can I do in the bathroom?  Use night lights.  Install grab bars by the toilet and in the tub and shower. Do not use towel bars as grab bars.  Use non-skid mats or decals in the tub or shower.  If you need to sit down in the shower, use a plastic, non-slip stool.  Keep  the floor dry. Clean up any water that spills on the floor as soon as it happens.  Remove soap buildup in the tub or shower regularly.  Attach bath mats securely with double-sided non-slip rug tape.  Do not have throw rugs and other things on the floor that can make you trip. What can I do in the bedroom?  Use night lights.  Make sure that you have a light by your bed that is easy to reach.  Do not use any sheets or blankets that are too big for your bed. They should not hang down onto the floor.  Have a firm chair that has side arms. You can use this for support while you get dressed.  Do not have throw rugs and other things on the floor that can make you trip. What can I do in the kitchen?  Clean up any spills right away.  Avoid walking  on wet floors.  Keep items that you use a lot in easy-to-reach places.  If you need to reach something above you, use a strong step stool that has a grab bar.  Keep electrical cords out of the way.  Do not use floor polish or wax that makes floors slippery. If you must use wax, use non-skid floor wax.  Do not have throw rugs and other things on the floor that can make you trip. What can I do with my stairs?  Do not leave any items on the stairs.  Make sure that there are handrails on both sides of the stairs and use them. Fix handrails that are broken or loose. Make sure that handrails are as long as the stairways.  Check any carpeting to make sure that it is firmly attached to the stairs. Fix any carpet that is loose or worn.  Avoid having throw rugs at the top or bottom of the stairs. If you do have throw rugs, attach them to the floor with carpet tape.  Make sure that you have a light switch at the top of the stairs and the bottom of the stairs. If you do not have them, ask someone to add them for you. What else can I do to help prevent falls?  Wear shoes that: ? Do not have high heels. ? Have rubber bottoms. ? Are comfortable and fit you well. ? Are closed at the toe. Do not wear sandals.  If you use a stepladder: ? Make sure that it is fully opened. Do not climb a closed stepladder. ? Make sure that both sides of the stepladder are locked into place. ? Ask someone to hold it for you, if possible.  Clearly mark and make sure that you can see: ? Any grab bars or handrails. ? First and last steps. ? Where the edge of each step is.  Use tools that help you move around (mobility aids) if they are needed. These include: ? Canes. ? Walkers. ? Scooters. ? Crutches.  Turn on the lights when you go into a dark area. Replace any light bulbs as soon as they burn out.  Set up your furniture so you have a clear path. Avoid moving your furniture around.  If any of your floors  are uneven, fix them.  If there are any pets around you, be aware of where they are.  Review your medicines with your doctor. Some medicines can make you feel dizzy. This can increase your chance of falling. Ask your doctor what other things that you can do  to help prevent falls. This information is not intended to replace advice given to you by your health care provider. Make sure you discuss any questions you have with your health care provider. Document Released: 08/17/2009 Document Revised: 03/28/2016 Document Reviewed: 11/25/2014 Elsevier Interactive Patient Education  2018 [place] Maintenance, Female Adopting a healthy lifestyle and getting preventive care can go a long way to promote health and wellness. Talk with your health care provider about what schedule of regular examinations is right for you. This is a good chance for you to check in with your provider about disease prevention and staying healthy. In between checkups, there are plenty of things you can do on your own. Experts have done a lot of research about which lifestyle changes and preventive measures are most likely to keep you healthy. Ask your health care provider for more information. Weight and diet Eat a healthy diet  Be sure to include plenty of vegetables, fruits, low-fat dairy products, and lean protein.  Do not eat a lot of foods high in solid fats, added sugars, or salt.  Get regular exercise. This is one of the most important things you can do for your health. ? Most adults should exercise for at least 150 minutes each week. The exercise should increase your heart rate and make you sweat (moderate-intensity exercise). ? Most adults should also do strengthening exercises at least twice a week. This is in addition to the moderate-intensity exercise.  Maintain a healthy weight  Body mass index (BMI) is a measurement that can be used to identify possible weight problems. It estimates body fat based  on height and weight. Your health care provider can help determine your BMI and help you achieve or maintain a healthy weight.  For females 88 years of age and older: ? A BMI below 18.5 is considered underweight. ? A BMI of 18.5 to 24.9 is normal. ? A BMI of 25 to 29.9 is considered overweight. ? A BMI of 30 and above is considered obese.  Watch levels of cholesterol and blood lipids  You should start having your blood tested for lipids and cholesterol at 77 years of age, then have this test every 5 years.  You may need to have your cholesterol levels checked more often if: ? Your lipid or cholesterol levels are high. ? You are older than 77 years of age. ? You are at high risk for heart disease.  Cancer screening Lung Cancer  Lung cancer screening is recommended for adults 73-42 years old who are at high risk for lung cancer because of a history of smoking.  A yearly low-dose CT scan of the lungs is recommended for people who: ? Currently smoke. ? Have quit within the past 15 years. ? Have at least a 30-pack-year history of smoking. A pack year is smoking an average of one pack of cigarettes a day for 1 year.  Yearly screening should continue until it has been 15 years since you quit.  Yearly screening should stop if you develop a health problem that would prevent you from having lung cancer treatment.  Breast Cancer  Practice breast self-awareness. This means understanding how your breasts normally appear and feel.  It also means doing regular breast self-exams. Let your health care provider know about any changes, no matter how small.  If you are in your 20s or 30s, you should have a clinical breast exam (CBE) by a health care provider every 1-3 years as part of  a regular health exam.  If you are 2 or older, have a CBE every year. Also consider having a breast X-ray (mammogram) every year.  If you have a family history of breast cancer, talk to your health care provider  about genetic screening.  If you are at high risk for breast cancer, talk to your health care provider about having an MRI and a mammogram every year.  Breast cancer gene (BRCA) assessment is recommended for women who have family members with BRCA-related cancers. BRCA-related cancers include: ? Breast. ? Ovarian. ? Tubal. ? Peritoneal cancers.  Results of the assessment will determine the need for genetic counseling and BRCA1 and BRCA2 testing.  Cervical Cancer Your health care provider may recommend that you be screened regularly for cancer of the pelvic organs (ovaries, uterus, and vagina). This screening involves a pelvic examination, including checking for microscopic changes to the surface of your cervix (Pap test). You may be encouraged to have this screening done every 3 years, beginning at age 70.  For women ages 33-65, health care providers may recommend pelvic exams and Pap testing every 3 years, or they may recommend the Pap and pelvic exam, combined with testing for human papilloma virus (HPV), every 5 years. Some types of HPV increase your risk of cervical cancer. Testing for HPV may also be done on women of any age with unclear Pap test results.  Other health care providers may not recommend any screening for nonpregnant women who are considered low risk for pelvic cancer and who do not have symptoms. Ask your health care provider if a screening pelvic exam is right for you.  If you have had past treatment for cervical cancer or a condition that could lead to cancer, you need Pap tests and screening for cancer for at least 20 years after your treatment. If Pap tests have been discontinued, your risk factors (such as having a new sexual partner) need to be reassessed to determine if screening should resume. Some women have medical problems that increase the chance of getting cervical cancer. In these cases, your health care provider may recommend more frequent screening and Pap  tests.  Colorectal Cancer  This type of cancer can be detected and often prevented.  Routine colorectal cancer screening usually begins at 77 years of age and continues through 77 years of age.  Your health care provider may recommend screening at an earlier age if you have risk factors for colon cancer.  Your health care provider may also recommend using home test kits to check for hidden blood in the stool.  A small camera at the end of a tube can be used to examine your colon directly (sigmoidoscopy or colonoscopy). This is done to check for the earliest forms of colorectal cancer.  Routine screening usually begins at age 63.  Direct examination of the colon should be repeated every 5-10 years through 77 years of age. However, you may need to be screened more often if early forms of precancerous polyps or small growths are found.  Skin Cancer  Check your skin from head to toe regularly.  Tell your health care provider about any new moles or changes in moles, especially if there is a change in a mole's shape or color.  Also tell your health care provider if you have a mole that is larger than the size of a pencil eraser.  Always use sunscreen. Apply sunscreen liberally and repeatedly throughout the day.  Protect yourself by wearing long sleeves,  pants, a wide-brimmed hat, and sunglasses whenever you are outside.  Heart disease, diabetes, and high blood pressure  High blood pressure causes heart disease and increases the risk of stroke. High blood pressure is more likely to develop in: ? People who have blood pressure in the high end of the normal range (130-139/85-89 mm Hg). ? People who are overweight or obese. ? People who are African American.  If you are 40-13 years of age, have your blood pressure checked every 3-5 years. If you are 18 years of age or older, have your blood pressure checked every year. You should have your blood pressure measured twice-once when you are at  a hospital or clinic, and once when you are not at a hospital or clinic. Record the average of the two measurements. To check your blood pressure when you are not at a hospital or clinic, you can use: ? An automated blood pressure machine at a pharmacy. ? A home blood pressure monitor.  If you are between 59 years and 49 years old, ask your health care provider if you should take aspirin to prevent strokes.  Have regular diabetes screenings. This involves taking a blood sample to check your fasting blood sugar level. ? If you are at a normal weight and have a low risk for diabetes, have this test once every three years after 77 years of age. ? If you are overweight and have a high risk for diabetes, consider being tested at a younger age or more often. Preventing infection Hepatitis B  If you have a higher risk for hepatitis B, you should be screened for this virus. You are considered at high risk for hepatitis B if: ? You were born in a country where hepatitis B is common. Ask your health care provider which countries are considered high risk. ? Your parents were born in a high-risk country, and you have not been immunized against hepatitis B (hepatitis B vaccine). ? You have HIV or AIDS. ? You use needles to inject street drugs. ? You live with someone who has hepatitis B. ? You have had sex with someone who has hepatitis B. ? You get hemodialysis treatment. ? You take certain medicines for conditions, including cancer, organ transplantation, and autoimmune conditions.  Hepatitis C  Blood testing is recommended for: ? Everyone born from [birth year] through 1966. ? Anyone with known risk factors for hepatitis C.  Sexually transmitted infections (STIs)  You should be screened for sexually transmitted infections (STIs) including gonorrhea and chlamydia if: ? You are sexually active and are younger than 77 years of age. ? You are older than 77 years of age and your health care provider tells  you that you are at risk for this type of infection. ? Your sexual activity has changed since you were last screened and you are at an increased risk for chlamydia or gonorrhea. Ask your health care provider if you are at risk.  If you do not have HIV, but are at risk, it may be recommended that you take a prescription medicine daily to prevent HIV infection. This is called pre-exposure prophylaxis (PrEP). You are considered at risk if: ? You are sexually active and do not regularly use condoms or know the HIV status of your partner(s). ? You take drugs by injection. ? You are sexually active with a partner who has HIV.  Talk with your health care provider about whether you are at high risk of being infected with HIV. If you  choose to begin PrEP, you should first be tested for HIV. You should then be tested every 3 months for as long as you are taking PrEP. Pregnancy  If you are premenopausal and you may become pregnant, ask your health care provider about preconception counseling.  If you may become pregnant, take 400 to 800 micrograms (mcg) of folic acid every day.  If you want to prevent pregnancy, talk to your health care provider about birth control (contraception). Osteoporosis and menopause  Osteoporosis is a disease in which the bones lose minerals and strength with aging. This can result in serious bone fractures. Your risk for osteoporosis can be identified using a bone density scan.  If you are 40 years of age or older, or if you are at risk for osteoporosis and fractures, ask your health care provider if you should be screened.  Ask your health care provider whether you should take a calcium or vitamin D supplement to lower your risk for osteoporosis.  Menopause may have certain physical symptoms and risks.  Hormone replacement therapy may reduce some of these symptoms and risks. Talk to your health care provider about whether hormone replacement therapy is right for  you. Follow these instructions at home:  Schedule regular health, dental, and eye exams.  Stay current with your immunizations.  Do not use any tobacco products including cigarettes, chewing tobacco, or electronic cigarettes.  If you are pregnant, do not drink alcohol.  If you are breastfeeding, limit how much and how often you drink alcohol.  Limit alcohol intake to no more than 1 drink per day for nonpregnant women. One drink equals 12 ounces of beer, 5 ounces of wine, or 1 ounces of hard liquor.  Do not use street drugs.  Do not share needles.  Ask your health care provider for help if you need support or information about quitting drugs.  Tell your health care provider if you often feel depressed.  Tell your health care provider if you have ever been abused or do not feel safe at home. This information is not intended to replace advice given to you by your health care provider. Make sure you discuss any questions you have with your health care provider. Document Released: 05/06/2011 Document Revised: 03/28/2016 Document Reviewed: 07/25/2015 Elsevier Interactive Patient Education  2018 [employer].  [employer] Information Description of Services Cost  A Matter of Balance Class locations vary. Call [place] on Aging for more information.  http://dawson-may.[email or web address]/ [phone] 8-Session program addressing the fear of falling and increasing activity levels of older adults Free to minimal cost  A.C.T. By [employer] [address], [place], [place] [address].  BetaBlues.dk [phone]  Personal training, gym, classes including Silver Sneakers* and ACTion for Aging Adults Fee-based  A.H.O.Y. (Add Health to [place]) Airs on Time [employer] 13, M-F at [place]: [employer],  [place] [place],  [place], [place], [address] [facility], [place], [place], [place] [address]  [place] Location: Sharrell Ku. [employer] [place] [employer]      [phone]  [phone]  [phone]  [phone]  [phone]  [phone]  [phone]  [phone]  [phone]  [phone]    [phone] A total-body conditioning class for adults 55 and older; designed to increase muscular strength, endurance, range of movement, flexibility, balance, agility and coordination Free  [facility] [place], [place] [address] [place]      [address]      [phone]      Pilate's class for individualsreturning to exercise after an injury, before or after surgery or for individuals with complex musculoskeletal issues; designed to improve strength, balance , flexibility      $15/class  [place] [address] [place], [place] [address] [email or web address] [place] classes for beginners to advanced [address] [place], [place] [address] [email or web address] [place] Chair Exercises Free, ages 9 and older; Ages 74-59 fee based  Marvia Pickles, [facility] [address] [place],  [address] Seniorcenter_1 .Beverlee Nims [phone]  A.H.O.Y. Tai Chi Fee-based Donation based or free  [place] Class locations vary.  Call or email Angela Burke or view website for more  information. Info_[email or web address] .com GainPain.com.cy.html [phone] Ongoing classes at local [employer] and gyms Fee-based  Silver Sneakers A.C.T. By [employer]: [facility] [place] [phone]  [phone] Classes designed for older adults who want to improve their strength, flexibility, balance and endurance.   Silver sneakers is covered by some insurance plans and includes a fitness center membership at participating locations. Find out more by calling [phone] or visiting [email or web address] Covered by some insurance plans  [facility] [place] [phone] A.H.O.Y., fitness room, personal training, fitness classes for injury prevention, strength, balance, flexibility, water fitness classes Ages 55+: $48 for 6 months; Ages 33-54: $75 for 6 months  Tai Chi for Everybody [facility] [address] [place],  [address] Taichiforeverybody_3 .Patsi Sears [phone] Tai Chi classes for beginners to advanced; geared for seniors Donation Based      [employer] (Helpling Others Participate in Exercise     Loyal Gambler. Rosana Hoes, PhD, [place] [email or web address] .edu [place]     [phone]     A comprehensive fitness program for adults.  The program paris senior-level undergraduates Kinesiology students with adults who desire to learn how to exercise safely.  Includes a structural exercise class focusing on functional fitnesss     $100/semester in fall and spring; $75 in summer (no trainers)    *Silver Sneakers is covered by some Personal assistant and includes a  Museum/gallery curator at participating locations.  Find out more by calling [phone] or visiting [email or web address]  For additional health and human services resources for senior adults, please contact SeniorLine at [phone] in [place] and [place] at [phone] in all other areas.

## 2017-10-22 NOTE — Progress Notes (Addendum)
Subjective:   Brianna Mccarty is a 76 y.o. female who presents for Medicare Annual (Subsequent) preventive examination.  Diet Eats well   Exercise 3 days a week Does the treadmill x 2 miles each time  Has 50% tear of rotator cuff  Thinking about strengthening with modifications  Has 17 people coming for Christmas All family     was in ER in July from BP med Her sodium dropped but is stable now    Father was 33 when he died Mother was 63 Brother had cancer; 75 bile duct  Older sister had diabetes  Last dexa has improved  Taking calcium   There are no preventive care reminders to display for this patient. GYN in January-Dr. Fontaine Mammogram due in jan 2019 Dexa 01/2017 showed improvement per her report      Objective:     Vitals: BP 122/60   Pulse 65   Ht 5' 2.4" (1.585 m)   Wt 186 lb (84.4 kg)   SpO2 96%   BMI 33.59 kg/m   Body mass index is 33.59 kg/m.  Advanced Directives 10/22/2017 05/24/2017 05/24/2017 03/07/2017 12/13/2016 08/06/2016 07/05/2016  Does Patient Have a Medical Advance Directive? Yes - Yes Yes Yes Yes Yes  Type of Advance Directive - - [place];Living will [place];Living will Living will [place];Living will -  Does patient want to make changes to medical advance directive? - - No - Patient declined No - Patient declined - - -  Copy of [place] in Chart? - No - copy requested No - copy requested - - No - copy requested No - copy requested  Would patient like information on creating a medical advance directive? - - - - - No - patient declined information -   Will fup on adding AD to snapshot    Tobacco Social History   Tobacco Use  Smoking Status Never Smoker  Smokeless Tobacco Never Used     Counseling given: Yes   Clinical Intake:    Past Medical History:  Diagnosis Date  . Abnormal EKG    Normal LV function in the past  . Allergic rhinitis   . Asthma    . Bronchitis, mucopurulent recurrent ([place])   . Carotid artery disease ([place])    Doppler, April, 2009, 0-39% bilateral  . Cervical dysplasia 1971  . Chest pain, unspecified   . Diverticulosis of colon   . DJD (degenerative joint disease)   . Ejection fraction    EF 60%, echo, 2009  . GERD (gastroesophageal reflux disease)   . Headache(784.0)   . Hypercholesterolemia   . Hypertension   . Interstitial cystitis    sees urologist  . Lichen sclerosus   . Malignant melanoma ([place])    sees Dr. [place] Crane in dermatology  . Migraines   . Mitral valve disease    Question mitral valve prolapse in the past, no prolapse by echo 2009  . Murmur 10/20/2015  . Osteoporosis    on fosomax > 5 years, stopped 11/2015  . Renal calculus    sees urologist  . Thyroid cyst    1 x 1.1 thyroid cyst noted on carotid Doppler, January, 2012  . Venous insufficiency    Past Surgical History:  Procedure Laterality Date  . BREAST BIOPSY Left 11/25/2011   U/S core, benign performed at [facility]  . BREAST CYST ASPIRATION    . BREAST SURGERY  2013   Breast Bx-Benign  .  CARDIAC CATHETERIZATION    . CATARACT EXTRACTION, BILATERAL    . cystoscopy and basket stone removal right ureter  02/2006   Dr. Terance Hart  . GYNECOLOGIC CRYOSURGERY  1971  . left total hip replacement  2004   Dr. Gladstone Lighter  . melanoma removed from medial rleft knee area  2006   Dr. Nevada Crane  . NASAL SEPTUM SURGERY     Family History  Problem Relation Age of Onset  . Cancer Father        Pancreatic  . Diabetes Mother   . Heart disease Mother   . Cancer Brother        Bile duct  . Diabetes Brother   . Hypertension Brother   . Diabetes Brother   . Heart disease Brother   . Hypertension Brother   . Hypertension Sister   . Hypertension Sister   . Colon cancer Paternal Uncle    Social History   Socioeconomic History  . Marital status: Married    Spouse name: Edison Nasuti  . Number of children: 2  . Years of education: Not on file  . Highest  education level: Not on file  Social Needs  . Financial resource strain: Not on file  . Food insecurity - worry: Not on file  . Food insecurity - inability: Not on file  . Transportation needs - medical: Not on file  . Transportation needs - non-medical: Not on file  Occupational History  . Occupation: retired    Fish farm manager: RETIRED  Tobacco Use  . Smoking status: Never Smoker  . Smokeless tobacco: Never Used  Substance and Sexual Activity  . Alcohol use: Yes    Alcohol/week: 0.6 oz    Types: 1 Standard drinks or equivalent per week    Comment: social use  . Drug use: No  . Sexual activity: No    Birth control/protection: Post-menopausal    Comment: 1st intercourse 80 yo-1 partner  Other Topics Concern  . Not on file  Social History Narrative   Updated 11/2015   Work or School: none      Home Situation: lives with husband      Spiritual Beliefs: christian      Lifestyle: regular exercise; healthy diet       Outpatient Encounter Medications as of 10/22/2017  Medication Sig  . acyclovir (ZOVIRAX) 400 MG tablet TAKE 1 TABLET BY MOUTH 3 TIMES A DAY FOR 5DAYS FOR OUTBREAK  . albuterol (PROAIR HFA) 108 (90 BASE) MCG/ACT inhaler Inhale 2 puffs into the lungs every 6 (six) hours as needed for wheezing.  . Ascorbic Acid (VITAMIN C) 500 MG tablet Take 500 mg by mouth daily.    Marland Kitchen aspirin 81 MG tablet Take 81 mg by mouth daily.    Marland Kitchen azelastine (ASTELIN) 0.1 % nasal spray Instill 2 sprays into each nostril two times a day as needed for allergies  . budesonide-formoterol (SYMBICORT) 160-4.5 MCG/ACT inhaler Inhale 2 puffs 2 (two) times daily into the lungs.  . Calcium Carbonate (CALCIUM 600) 1500 MG TABS Take 1 tablet by mouth daily.    . Cholecalciferol (VITAMIN D3) 5000 UNITS CAPS Take 5,000 Units by mouth daily.   . Cinnamon 500 MG TABS Take 1 tablet by mouth daily.   . clobetasol cream (TEMOVATE) 7.62 % Apply 1 application topically as needed (IRRITATION).  . Coenzyme Q10 (COQ10)  100 MG CAPS Take 1 capsule by mouth daily.    Marland Kitchen desmopressin (DDAVP) 0.2 MG tablet Take 3 tablets by mouth daily at bedtime   .  fexofenadine (ALLEGRA) 180 MG tablet Take 180 mg by mouth daily as needed for allergies or rhinitis.  . hydrALAZINE (APRESOLINE) 50 MG tablet Take 1 tablet by mouth three times a day  . losartan (COZAAR) 100 MG tablet TAKE 1 TABLET BY MOUTH EVERY DAY  . magnesium oxide (MAG-OX) 400 MG tablet Take 400 mg by mouth daily.    . Multiple Vitamins-Minerals (OCUVITE ADULT 50+) CAPS Take 1 capsule by mouth daily.  Marland Kitchen omeprazole (PRILOSEC) 40 MG capsule TAKE 1 CAPSULE BY MOUTH EVERY DAY  . pentosan polysulfate (ELMIRON) 100 MG capsule Take 100 mg by mouth 3 (three) times daily before meals.    . pravastatin (PRAVACHOL) 20 MG tablet Take 20 mg daily by mouth.  . prazosin (MINIPRESS) 5 MG capsule TAKE ONE CAPSULE BY MOUTH TWICE A DAY  . Probiotic Product (PROBIOTIC PO) Take 1 capsule by mouth daily.   . [DISCONTINUED] acyclovir (ZOVIRAX) 400 MG tablet Take 400 mg by mouth as needed (BREAKOUTS).  Marland Kitchen albuterol (PROVENTIL) (2.5 MG/3ML) 0.083% nebulizer solution Take 3 mLs (2.5 mg total) by nebulization every 6 (six) hours as needed.  Marland Kitchen amLODipine (NORVASC) 10 MG tablet Take 1 tablet (10 mg total) by mouth daily.  . hydrOXYzine (ATARAX/VISTARIL) 10 MG tablet Take 10 mg by mouth daily.  . [DISCONTINUED] amLODipine (NORVASC) 10 MG tablet TAKE 1 TABLET BY MOUTH EVERY DAY * NEEDS APPT  . [DISCONTINUED] CINNAMON PO Take 1 capsule by mouth daily.   No facility-administered encounter medications on file as of 10/22/2017.     Activities of Daily Living In your present state of health, do you have any difficulty performing the following activities: 10/22/2017 05/24/2017  Hearing? N N  Vision? N N  Difficulty concentrating or making decisions? N N  Walking or climbing stairs? N N  Dressing or bathing? N N  Doing errands, shopping? N N  Preparing Food and eating ? N -  Using the Toilet? N  -  Managing your Medications? N -  Managing your Finances? N -  Housekeeping or managing your Housekeeping? N -  Some recent data might be hidden    Patient Care Team: Lucretia Kern, DO as PCP - General (Family Medicine)    Assessment:   This is a routine wellness examination for Stacyann.  Exercise Activities and Dietary recommendations walks 3 days a week but would like to increase her exercise Given resources for balance classes and other in the community   Current Exercise Habits: Home exercise routine;Structured exercise class, Time (Minutes): 30, Frequency (Times/Week): 3(to increase ), Weekly Exercise (Minutes/Week): 90, Intensity: Moderate  Goals    . Exercise 150 min/wk Moderate Activity     More exercise and lose more weight Will cut calories after the holidays   Check out  online nutrition programs as GumSearch.nl and http://vang.com/; fit68me; Look for foods with "whole" wheat; bran; oatmeal etc Shot at the farmer's markets in season for fresher choices  Watch for "hydrogenated" on the label of oils which are trans-fats.  Watch for "high fructose corn syrup" in snacks, yogurt or ketchup  Meats have less marbling; bright colored fruits and vegetables;  Canned; dump out liquid and wash vegetables. Be mindful of what we are eating  Portion control is essential to a health weight! Sit down; take a break and enjoy your meal; take smaller bites; put the fork down between bites;  It takes 20 minutes to get full; so check in with your fullness cues and stop eating when you  start to fill full              Fall Risk Fall Risk  10/22/2017 10/22/2017 07/05/2016 07/03/2016 11/27/2015  Falls in the past year? No No Yes No Yes  Number falls in past yr: - - 1 - 1  Injury with Fall? - - - - Yes  Follow up - - Education provided - -     Depression Screen PHQ 2/9 Scores 10/22/2017 07/03/2016 11/27/2015 08/06/2015  PHQ - 2 Score 0 0 0 0     Cognitive Function MMSE - Mini  Mental State Exam 10/22/2017 07/05/2016  Not completed: (No Data) (No Data)     Ad8 score reviewed for issues:  Issues making decisions:  Less interest in hobbies / activities:  Repeats questions, stories (family complaining):  Trouble using ordinary gadgets (microwave, computer, phone):  Forgets the month or year:   Mismanaging finances:   Remembering appts:  Daily problems with thinking and/or memory: Ad8 score is=0       Immunization History  Administered Date(s) Administered  . Influenza Split 09/18/2011, 08/11/2012, 07/28/2014  . Influenza Whole 07/21/2008, 08/04/2009, 08/01/2010  . Influenza, High Dose Seasonal PF 07/19/2013, 07/28/2015, 09/01/2017  . Influenza,inj,Quad PF,6+ Mos 07/03/2016  . Pneumococcal Conjugate-13 12/06/2013  . Pneumococcal Polysaccharide-23 10/03/2008  . Tdap 11/04/2008, 10/24/2015  . Zoster 11/05/2003    Qualifies for Shingles Vaccine?   Had the zoster and will wait at this juncture  Screening Tests Health Maintenance  Topic Date Due  . COLONOSCOPY  02/07/2020  . TETANUS/TDAP  10/23/2025  . INFLUENZA VACCINE  Completed  . DEXA SCAN  Completed  . PNA vac Low Risk Adult  Completed          Plan:      PCP Notes   Health Maintenance There are no preventive care reminders to display for this patient. Dexa was improved   Abnormal Screens  none  Referrals  No   Patient concerns; Feels good;   Nurse Concerns; Just had sleep home study Has not rec'd report yet -  Plans to reach out to Dr. Meda Coffee for confirmation of transfer of data or if this has been interpreted   Next PCP apt To schedule fup with Dr. Maudie Mercury;     I have personally reviewed and noted the following in the patient's chart:   . Medical and social history . Use of alcohol, tobacco or illicit drugs  . Current medications and supplements . Functional ability and status . Nutritional status . Physical activity . Advanced directives . List of other  physicians . Hospitalizations, surgeries, and ER visits in previous 12 months . Vitals . Screenings to include cognitive, depression, and falls . Referrals and appointments  In addition, I have reviewed and discussed with patient certain preventive protocols, quality metrics, and best practice recommendations. A written personalized care plan for preventive services as well as general preventive health recommendations were provided to patient.     XKPVV,ZSMOL, RN  10/22/2017  Above reviewed in absence of primary provider- who is out of office today.  Agree with the above.  Eulas Post MD [facility] at [facility]

## 2017-10-29 DIAGNOSIS — J3089 Other allergic rhinitis: Secondary | ICD-10-CM | POA: Diagnosis not present

## 2017-10-29 DIAGNOSIS — J301 Allergic rhinitis due to pollen: Secondary | ICD-10-CM | POA: Diagnosis not present

## 2017-11-01 DIAGNOSIS — R509 Fever, unspecified: Secondary | ICD-10-CM | POA: Diagnosis not present

## 2017-11-01 DIAGNOSIS — R05 Cough: Secondary | ICD-10-CM | POA: Diagnosis not present

## 2017-11-01 DIAGNOSIS — J101 Influenza due to other identified influenza virus with other respiratory manifestations: Secondary | ICD-10-CM | POA: Diagnosis not present

## 2017-11-03 DIAGNOSIS — G4733 Obstructive sleep apnea (adult) (pediatric): Secondary | ICD-10-CM | POA: Diagnosis not present

## 2017-11-05 ENCOUNTER — Encounter: Payer: PPO | Admitting: Gynecology

## 2017-11-06 DIAGNOSIS — J309 Allergic rhinitis, unspecified: Secondary | ICD-10-CM | POA: Diagnosis not present

## 2017-11-06 DIAGNOSIS — J45909 Unspecified asthma, uncomplicated: Secondary | ICD-10-CM | POA: Diagnosis not present

## 2017-11-06 DIAGNOSIS — J111 Influenza due to unidentified influenza virus with other respiratory manifestations: Secondary | ICD-10-CM | POA: Diagnosis not present

## 2017-11-11 DIAGNOSIS — L218 Other seborrheic dermatitis: Secondary | ICD-10-CM | POA: Diagnosis not present

## 2017-11-11 DIAGNOSIS — Z1283 Encounter for screening for malignant neoplasm of skin: Secondary | ICD-10-CM | POA: Diagnosis not present

## 2017-11-11 DIAGNOSIS — X32XXXD Exposure to sunlight, subsequent encounter: Secondary | ICD-10-CM | POA: Diagnosis not present

## 2017-11-11 DIAGNOSIS — L570 Actinic keratosis: Secondary | ICD-10-CM | POA: Diagnosis not present

## 2017-11-11 DIAGNOSIS — Z85820 Personal history of malignant melanoma of skin: Secondary | ICD-10-CM | POA: Diagnosis not present

## 2017-11-11 DIAGNOSIS — L821 Other seborrheic keratosis: Secondary | ICD-10-CM | POA: Diagnosis not present

## 2017-11-11 DIAGNOSIS — B078 Other viral warts: Secondary | ICD-10-CM | POA: Diagnosis not present

## 2017-11-11 DIAGNOSIS — Z08 Encounter for follow-up examination after completed treatment for malignant neoplasm: Secondary | ICD-10-CM | POA: Diagnosis not present

## 2017-11-12 ENCOUNTER — Telehealth: Payer: Self-pay | Admitting: *Deleted

## 2017-11-12 NOTE — Progress Notes (Signed)
HPI:  Brianna Mccarty is a pleasant 77 y.o. here for follow up. Chronic medical problems summarized below were reviewed for changes and stability and were updated as needed below. These issues and their treatment remain stable for the most part. Had flu over christmas, now doing much better. Denies CP, SOB, DOE, treatment intolerance or new symptoms.  Due for labs  Obesity: -trying to stay active and eat well  OSA: -seeing cardiologist for managment -mod-severe on study with cardiologist in 2018, CPAP titration pending  Anxiety and Depression: -much better as daughter is now doing better  HTN/Carotid Art Dz/HLD/venous insufficiency: -sees cardiologist -meds: norvasc, hydralazine, hctz, losartan, prazosin, asa, pravastatin  Asthma/Allergies: -meds: alb prn, azelastine nasal, Symbicort, allegra  GERD: -meds: prilosec 40mg   Osteoporosis: -on fosamax in the past, now on holiday per her preference since 2017  Melanoma: -sees dermatologist for management  IC: -sees urologist for management -meds: desmopressin, elmiron ROS: See pertinent positives and negatives per HPI.    ROS: See pertinent positives and negatives per HPI.  Past Medical History:  Diagnosis Date  . Abnormal EKG    Normal LV function in the past  . Allergic rhinitis   . Asthma   . Bronchitis, mucopurulent recurrent ([place])   . Carotid artery disease ([place])    Doppler, April, 2009, 0-39% bilateral  . Cervical dysplasia 1971  . Chest pain, unspecified   . Diverticulosis of colon   . DJD (degenerative joint disease)   . Ejection fraction    EF 60%, echo, 2009  . GERD (gastroesophageal reflux disease)   . Headache(784.0)   . Hypercholesterolemia   . Hypertension   . Interstitial cystitis    sees urologist  . Lichen sclerosus   . Malignant melanoma ([place])    sees Dr. Nevada Crane in dermatology  . Migraines   . Mitral valve disease    Question mitral valve prolapse in the past, no prolapse by echo  2009  . Murmur 10/20/2015  . Osteoporosis    on fosomax > 5 years, stopped 11/2015  . Renal calculus    sees urologist  . Thyroid cyst    1 x 1.1 thyroid cyst noted on carotid Doppler, January, 2012  . Venous insufficiency     Past Surgical History:  Procedure Laterality Date  . BREAST BIOPSY Left 11/25/2011   U/S core, benign performed at [facility]  . BREAST CYST ASPIRATION    . BREAST SURGERY  2013   Breast Bx-Benign  . CARDIAC CATHETERIZATION    . CATARACT EXTRACTION, BILATERAL    . cystoscopy and basket stone removal right ureter  02/2006   Dr. Terance Hart  . GYNECOLOGIC CRYOSURGERY  1971  . left total hip replacement  2004   Dr. Gladstone Lighter  . melanoma removed from medial rleft knee area  2006   Dr. Nevada Crane  . NASAL SEPTUM SURGERY      Family History  Problem Relation Age of Onset  . Cancer Father        Pancreatic  . Diabetes Mother   . Heart disease Mother   . Cancer Brother        Bile duct  . Diabetes Brother   . Hypertension Brother   . Diabetes Brother   . Heart disease Brother   . Hypertension Brother   . Hypertension Sister   . Hypertension Sister   . Colon cancer Paternal Uncle     Social History   Socioeconomic History  . Marital status: Married    Spouse  name: Edison Nasuti  . Number of children: 2  . Years of education: None  . Highest education level: None  Social Needs  . Financial resource strain: None  . Food insecurity - worry: None  . Food insecurity - inability: None  . Transportation needs - medical: None  . Transportation needs - non-medical: None  Occupational History  . Occupation: retired    Fish farm manager: RETIRED  Tobacco Use  . Smoking status: Never Smoker  . Smokeless tobacco: Never Used  Substance and Sexual Activity  . Alcohol use: Yes    Alcohol/week: 0.6 oz    Types: 1 Standard drinks or equivalent per week    Comment: social use  . Drug use: No  . Sexual activity: No    Birth control/protection: Post-menopausal    Comment: 1st  intercourse 57 yo-1 partner  Other Topics Concern  . None  Social History Narrative   Updated 11/2015   Work or School: none      Home Situation: lives with husband      Spiritual Beliefs: christian      Lifestyle: regular exercise; healthy diet        Current Outpatient Medications:  .  acyclovir (ZOVIRAX) 400 MG tablet, TAKE 1 TABLET BY MOUTH 3 TIMES A DAY FOR 5DAYS FOR OUTBREAK, Disp: 15 tablet, Rfl: 3 .  albuterol (PROAIR HFA) 108 (90 BASE) MCG/ACT inhaler, Inhale 2 puffs into the lungs every 6 (six) hours as needed for wheezing., Disp: 3 Inhaler, Rfl: 0 .  amLODipine (NORVASC) 10 MG tablet, Take 1 tablet (10 mg total) by mouth daily., Disp: 90 tablet, Rfl: 1 .  Ascorbic Acid (VITAMIN C) 500 MG tablet, Take 500 mg by mouth daily.  , Disp: , Rfl:  .  aspirin 81 MG tablet, Take 81 mg by mouth daily.  , Disp: , Rfl:  .  azelastine (ASTELIN) 0.1 % nasal spray, Instill 2 sprays into each nostril two times a day as needed for allergies, Disp: , Rfl: 5 .  budesonide-formoterol (SYMBICORT) 160-4.5 MCG/ACT inhaler, Inhale 2 puffs 2 (two) times daily into the lungs., Disp: 3 Inhaler, Rfl: 3 .  Calcium Carbonate (CALCIUM 600) 1500 MG TABS, Take 1 tablet by mouth daily.  , Disp: , Rfl:  .  Cholecalciferol (VITAMIN D3) 5000 UNITS CAPS, Take 5,000 Units by mouth daily. , Disp: , Rfl:  .  Cinnamon 500 MG TABS, Take 1 tablet by mouth daily. , Disp: , Rfl:  .  clobetasol cream (TEMOVATE) 3.66 %, Apply 1 application topically as needed (IRRITATION)., Disp: , Rfl:  .  Coenzyme Q10 (COQ10) 100 MG CAPS, Take 1 capsule by mouth daily.  , Disp: , Rfl:  .  desmopressin (DDAVP) 0.2 MG tablet, Take 3 tablets by mouth daily at bedtime , Disp: , Rfl:  .  fexofenadine (ALLEGRA) 180 MG tablet, Take 180 mg by mouth daily as needed for allergies or rhinitis., Disp: , Rfl:  .  hydrALAZINE (APRESOLINE) 50 MG tablet, Take 1 tablet by mouth three times a day, Disp: 360 tablet, Rfl: 1 .  hydrOXYzine (ATARAX/VISTARIL)  10 MG tablet, Take 10 mg by mouth daily., Disp: , Rfl:  .  losartan (COZAAR) 100 MG tablet, TAKE 1 TABLET BY MOUTH EVERY DAY, Disp: 30 tablet, Rfl: 0 .  magnesium oxide (MAG-OX) 400 MG tablet, Take 400 mg by mouth daily.  , Disp: , Rfl:  .  Multiple Vitamins-Minerals (OCUVITE ADULT 50+) CAPS, Take 1 capsule by mouth daily., Disp: , Rfl:  .  omeprazole (PRILOSEC) 40 MG capsule, TAKE 1 CAPSULE BY MOUTH EVERY DAY, Disp: 90 capsule, Rfl: 0 .  pentosan polysulfate (ELMIRON) 100 MG capsule, Take 100 mg by mouth 3 (three) times daily before meals.  , Disp: , Rfl:  .  pravastatin (PRAVACHOL) 20 MG tablet, Take 20 mg daily by mouth., Disp: , Rfl:  .  prazosin (MINIPRESS) 5 MG capsule, TAKE ONE CAPSULE BY MOUTH TWICE A DAY, Disp: 180 capsule, Rfl: 1 .  Probiotic Product (PROBIOTIC PO), Take 1 capsule by mouth daily. , Disp: , Rfl:  .  albuterol (PROVENTIL) (2.5 MG/3ML) 0.083% nebulizer solution, Take 3 mLs (2.5 mg total) by nebulization every 6 (six) hours as needed., Disp: 360 mL, Rfl: 0  EXAM:  Vitals:   11/13/17 1346  BP: 112/64  Pulse: 69  Temp: 97.9 F (36.6 C)    Body mass index is 33.51 kg/m.  GENERAL: vitals reviewed and listed above, alert, oriented, appears well hydrated and in no acute distress  HEENT: atraumatic, conjunttiva clear, no obvious abnormalities on inspection of external nose and ears  NECK: no obvious masses on inspection  LUNGS: clear to auscultation bilaterally, no wheezes, rales or rhonchi, good air movement  CV: HRRR, no peripheral edema  MS: moves all extremities without noticeable abnormality  PSYCH: pleasant and cooperative, no obvious depression or anxiety  ASSESSMENT AND PLAN:  Discussed the following assessment and plan:  Essential hypertension  BMI 33.0-33.9,adult  HYPERCHOLESTEROLEMIA  OSA (obstructive sleep apnea) - -sleep study 2018 with cardiology  -lifestyle recs -labs -looks like glu up in the past on labs done elsewhere - will check  hgba1c -seeing cardiology about OSA -follow up 3-4 months  Patient Instructions  BEFORE YOU LEAVE: -labs -follow up: follow up or physical (if she wishes) in 3 months  We have ordered labs or studies at this visit. It can take up to 1-2 weeks for results and processing. IF results require follow up or explanation, we will call you with instructions. Clinically stable results will be released to your [facility]. If you have not heard from [place] or cannot find your results in [facility] in 2 weeks please contact our office at [phone].  If you are not yet signed up for [facility], please consider signing up.   We recommend the following healthy lifestyle for LIFE: 1) Small portions. But, make sure to get regular (at least 3 per day), healthy meals and small healthy snacks if needed.  2) Eat a healthy clean diet.   TRY TO EAT: -at least 5-7 servings of low sugar, colorful, and nutrient rich vegetables per day (not corn, potatoes or bananas.) -berries are the best choice if you wish to eat fruit (only eat small amounts if trying to reduce weight)  -lean meets (fish, white meat of chicken or [place]) -vegan proteins for some meals - beans or tofu, whole grains, nuts and seeds -Replace bad fats with good fats - good fats include: fish, nuts and seeds, canola oil, olive oil -small amounts of low fat or non fat dairy -small amounts of100 % whole grains - check the lables -drink plenty of water  AVOID: -SUGAR, sweets, anything with added sugar, corn syrup or sweeteners - must read labels as even foods advertised as "healthy" often are loaded with sugar -if you must have a sweetener, small amounts of stevia may be best -sweetened beverages and artificially sweetened beverages -simple starches (rice, bread, potatoes, pasta, chips, etc - small amounts of 100% whole grains are ok) -red  meat, pork, butter -fried foods, fast food, processed food, excessive dairy, eggs and coconut.  3)Get at least 150  minutes of sweaty aerobic exercise per week.  4)Reduce stress - consider counseling, meditation and relaxation to balance other aspects of your life.          Colin Benton R., DO

## 2017-11-12 NOTE — Telephone Encounter (Signed)
Informed patient of sleep study results and patient understanding was verbalized. Patient understands she has moderate to severe OSA and Dr Radford Pax has ordered an in lab CPAP titration to treat her OSA. Patient understands her titration study will be done at [facility] sleep lab. Patient thanked me for the call.

## 2017-11-12 NOTE — Telephone Encounter (Signed)
-----   Message from Brianna Margarita, MD sent at 11/08/2017  8:23 PM EST ----- Please let patient know that she has moderate to severe OSA and set up in lab CPAP titratio

## 2017-11-13 ENCOUNTER — Encounter: Payer: Self-pay | Admitting: Family Medicine

## 2017-11-13 ENCOUNTER — Ambulatory Visit (INDEPENDENT_AMBULATORY_CARE_PROVIDER_SITE_OTHER): Payer: PPO | Admitting: Family Medicine

## 2017-11-13 VITALS — BP 112/64 | HR 69 | Temp 97.9°F | Ht 62.4 in | Wt 185.6 lb

## 2017-11-13 DIAGNOSIS — I10 Essential (primary) hypertension: Secondary | ICD-10-CM

## 2017-11-13 DIAGNOSIS — J3089 Other allergic rhinitis: Secondary | ICD-10-CM | POA: Diagnosis not present

## 2017-11-13 DIAGNOSIS — Z6833 Body mass index (BMI) 33.0-33.9, adult: Secondary | ICD-10-CM

## 2017-11-13 DIAGNOSIS — E7800 Pure hypercholesterolemia, unspecified: Secondary | ICD-10-CM | POA: Diagnosis not present

## 2017-11-13 DIAGNOSIS — G4733 Obstructive sleep apnea (adult) (pediatric): Secondary | ICD-10-CM

## 2017-11-13 DIAGNOSIS — C439 Malignant melanoma of skin, unspecified: Secondary | ICD-10-CM | POA: Diagnosis not present

## 2017-11-13 DIAGNOSIS — J301 Allergic rhinitis due to pollen: Secondary | ICD-10-CM | POA: Diagnosis not present

## 2017-11-13 DIAGNOSIS — R739 Hyperglycemia, unspecified: Secondary | ICD-10-CM | POA: Diagnosis not present

## 2017-11-13 LAB — BASIC METABOLIC PANEL
BUN: 19 mg/dL (ref 6–23)
CO2: 26 meq/L (ref 19–32)
Calcium: 9.2 mg/dL (ref 8.4–10.5)
Chloride: 102 mEq/L (ref 96–112)
Creatinine, Ser: 0.76 mg/dL (ref 0.40–1.20)
GFR: 78.23 mL/min (ref >60.00)
GLUCOSE: 104 mg/dL — AB (ref 70–99)
POTASSIUM: 3.8 meq/L (ref 3.5–5.1)
SODIUM: 135 meq/L (ref 135–145)

## 2017-11-13 LAB — CBC
HCT: 36.6 % (ref 36.0–46.0)
HEMOGLOBIN: 12.1 g/dL (ref 12.0–15.0)
MCHC: 33 g/dL (ref 30.0–36.0)
MCV: 87.4 fl (ref 78.0–100.0)
PLATELETS: 253 10*3/uL (ref 150.0–400.0)
RBC: 4.19 Mil/uL (ref 3.87–5.11)
RDW: 14.4 % (ref 11.5–15.5)
WBC: 6 10*3/uL (ref 4.0–10.5)

## 2017-11-13 LAB — HEMOGLOBIN A1C: HEMOGLOBIN A1C: 6.1 % (ref 4.6–6.5)

## 2017-11-13 NOTE — Patient Instructions (Signed)
BEFORE YOU LEAVE: -labs -follow up: follow up or physical (if she wishes) in 3 months  We have ordered labs or studies at this visit. It can take up to 1-2 weeks for results and processing. IF results require follow up or explanation, we will call you with instructions. Clinically stable results will be released to your [facility]. If you have not heard from [place] or cannot find your results in [facility] in 2 weeks please contact our office at [phone].  If you are not yet signed up for [facility], please consider signing up.   We recommend the following healthy lifestyle for LIFE: 1) Small portions. But, make sure to get regular (at least 3 per day), healthy meals and small healthy snacks if needed.  2) Eat a healthy clean diet.   TRY TO EAT: -at least 5-7 servings of low sugar, colorful, and nutrient rich vegetables per day (not corn, potatoes or bananas.) -berries are the best choice if you wish to eat fruit (only eat small amounts if trying to reduce weight)  -lean meets (fish, white meat of chicken or [place]) -vegan proteins for some meals - beans or tofu, whole grains, nuts and seeds -Replace bad fats with good fats - good fats include: fish, nuts and seeds, canola oil, olive oil -small amounts of low fat or non fat dairy -small amounts of100 % whole grains - check the lables -drink plenty of water  AVOID: -SUGAR, sweets, anything with added sugar, corn syrup or sweeteners - must read labels as even foods advertised as "healthy" often are loaded with sugar -if you must have a sweetener, small amounts of stevia may be best -sweetened beverages and artificially sweetened beverages -simple starches (rice, bread, potatoes, pasta, chips, etc - small amounts of 100% whole grains are ok) -red meat, pork, butter -fried foods, fast food, processed food, excessive dairy, eggs and coconut.  3)Get at least 150 minutes of sweaty aerobic exercise per week.  4)Reduce stress - consider counseling,  meditation and relaxation to balance other aspects of your life.

## 2017-11-17 ENCOUNTER — Telehealth: Payer: Self-pay | Admitting: *Deleted

## 2017-11-17 NOTE — Telephone Encounter (Signed)
Patient called back to say she does not want a CPAP she wants to be referred to Dr Augustina Mood for an oral device first.

## 2017-11-17 NOTE — Telephone Encounter (Signed)
Called results LMTCB 

## 2017-11-18 ENCOUNTER — Other Ambulatory Visit: Payer: Self-pay | Admitting: Family Medicine

## 2017-11-21 DIAGNOSIS — J3089 Other allergic rhinitis: Secondary | ICD-10-CM | POA: Diagnosis not present

## 2017-11-21 DIAGNOSIS — J301 Allergic rhinitis due to pollen: Secondary | ICD-10-CM | POA: Diagnosis not present

## 2017-11-24 DIAGNOSIS — J4540 Moderate persistent asthma, uncomplicated: Secondary | ICD-10-CM | POA: Diagnosis not present

## 2017-11-24 DIAGNOSIS — J3089 Other allergic rhinitis: Secondary | ICD-10-CM | POA: Diagnosis not present

## 2017-11-24 DIAGNOSIS — H1045 Other chronic allergic conjunctivitis: Secondary | ICD-10-CM | POA: Diagnosis not present

## 2017-11-24 DIAGNOSIS — J301 Allergic rhinitis due to pollen: Secondary | ICD-10-CM | POA: Diagnosis not present

## 2017-12-04 ENCOUNTER — Encounter: Payer: Self-pay | Admitting: Cardiology

## 2017-12-04 DIAGNOSIS — J3089 Other allergic rhinitis: Secondary | ICD-10-CM | POA: Diagnosis not present

## 2017-12-04 DIAGNOSIS — J301 Allergic rhinitis due to pollen: Secondary | ICD-10-CM | POA: Diagnosis not present

## 2017-12-08 ENCOUNTER — Ambulatory Visit: Payer: PPO | Admitting: Gynecology

## 2017-12-08 ENCOUNTER — Encounter: Payer: Self-pay | Admitting: Gynecology

## 2017-12-08 VITALS — BP 150/74 | Ht 62.0 in | Wt 186.0 lb

## 2017-12-08 DIAGNOSIS — L900 Lichen sclerosus et atrophicus: Secondary | ICD-10-CM

## 2017-12-08 DIAGNOSIS — N952 Postmenopausal atrophic vaginitis: Secondary | ICD-10-CM

## 2017-12-08 DIAGNOSIS — M810 Age-related osteoporosis without current pathological fracture: Secondary | ICD-10-CM

## 2017-12-08 DIAGNOSIS — Z01419 Encounter for gynecological examination (general) (routine) without abnormal findings: Secondary | ICD-10-CM | POA: Diagnosis not present

## 2017-12-08 DIAGNOSIS — Z01411 Encounter for gynecological examination (general) (routine) with abnormal findings: Secondary | ICD-10-CM

## 2017-12-08 MED ORDER — CLOBETASOL PROPIONATE 0.05 % EX CREA: 1.0000 "application " | TOPICAL_CREAM | CUTANEOUS | 2 refills | Status: DC | PRN

## 2017-12-08 NOTE — Patient Instructions (Signed)
Follow-up in 1 year for annual exam, sooner if any issues. 

## 2017-12-08 NOTE — Progress Notes (Addendum)
    Brianna Mccarty May 23, 1941 [id number]        77 y.o.  [id number] for breast and pelvic exam.  Past medical history,surgical history, problem list, medications, allergies, family history and social history were all reviewed and documented as reviewed in the EPIC chart.  ROS:  Performed with pertinent positives and negatives included in the history, assessment and plan.   Additional significant findings : None   Exam: Brianna Mccarty assistant Vitals:   12/08/17 1619  BP: (!) 150/74  Weight: 186 lb (84.4 kg)  Height: 5\' 2"  (1.575 m)   Body mass index is 34.02 kg/m.  General appearance:  Normal affect, orientation and appearance. Skin: Grossly normal HEENT: Without gross lesions.  No cervical or supraclavicular adenopathy. Thyroid normal.  Lungs:  Clear without wheezing, rales or rhonchi Cardiac: RR, without RMG Abdominal:  Soft, nontender, without masses, guarding, rebound, organomegaly or hernia Breasts:  Examined lying and sitting without masses, retractions, discharge or axillary adenopathy. Pelvic:  Ext, BUS, Vagina: With atrophic changes.  Slight blanching of the skin lateral to the posterior fourchette bilaterally consistent with her history of lichen sclerosis.  Cervix: With atrophic changes  Uterus: Anteverted, normal size, shape and contour, midline and mobile nontender   Adnexa: Without masses or tenderness    Anus and perineum: Normal   Rectovaginal: Normal sphincter tone without palpated masses or tenderness.    Assessment/Plan:  77 y.o. [id number] female for breast and pelvic exam.   1. Postmenopausal/atrophic genital changes.  No significant hot flushes, night sweats, vaginal dryness or any bleeding.  Continue to monitor and report any issues or bleeding. 2. Lichen sclerosis.  Uses clobetasol intermittently with good results.  30 g refill 2 provided with 2 refills. 3. Osteoporosis.  Actively being followed by Dr. Maudie Mercury.  Previously on Fosamax.  DEXA 01/2017 T score -1.8.   FRAX recognizing she had been on Fosamax 12% / 3%.  She will continue to follow-up with Dr. Maudie Mercury for bone health. 4. Mammography scheduled and patient will follow-up for this.  Breast exam normal today. 5. Colonoscopy 2016.  Repeat at their recommended interval. 6. Pap smear 2016.  No Pap smear done today.  History of cryosurgery 40 years ago with normal Pap smears since.  We both agree to stop screening per current screening guidelines based on age. 7. Health maintenance.  We did discuss her blood pressure 150/74.  She is going to have it rechecked in a non-exam situation.  If it remains elevated she will follow-up with her primary physician.  No routine lab work done as patient does this elsewhere.  Follow-up 1 year, sooner as needed.   Anastasio Auerbach MD, 4:39 PM 12/08/2017

## 2017-12-11 DIAGNOSIS — J3089 Other allergic rhinitis: Secondary | ICD-10-CM | POA: Diagnosis not present

## 2017-12-11 DIAGNOSIS — J301 Allergic rhinitis due to pollen: Secondary | ICD-10-CM | POA: Diagnosis not present

## 2017-12-12 ENCOUNTER — Ambulatory Visit: Payer: PPO | Admitting: Cardiology

## 2017-12-12 ENCOUNTER — Encounter: Payer: Self-pay | Admitting: Cardiology

## 2017-12-12 VITALS — BP 130/84 | HR 64 | Ht 62.0 in | Wt 183.0 lb

## 2017-12-12 DIAGNOSIS — I10 Essential (primary) hypertension: Secondary | ICD-10-CM

## 2017-12-12 DIAGNOSIS — E7800 Pure hypercholesterolemia, unspecified: Secondary | ICD-10-CM

## 2017-12-12 DIAGNOSIS — I350 Nonrheumatic aortic (valve) stenosis: Secondary | ICD-10-CM | POA: Diagnosis not present

## 2017-12-12 DIAGNOSIS — G4733 Obstructive sleep apnea (adult) (pediatric): Secondary | ICD-10-CM | POA: Diagnosis not present

## 2017-12-12 DIAGNOSIS — I5032 Chronic diastolic (congestive) heart failure: Secondary | ICD-10-CM

## 2017-12-12 DIAGNOSIS — E871 Hypo-osmolality and hyponatremia: Secondary | ICD-10-CM

## 2017-12-12 MED ORDER — FUROSEMIDE 20 MG PO TABS: 20.0000 mg | ORAL_TABLET | Freq: Every day | ORAL | 4 refills | Status: DC | PRN

## 2017-12-12 NOTE — Progress Notes (Signed)
12/12/2017 Brianna Mccarty   10-13-1941  [id number]  Primary Physician Lucretia Kern, DO Primary Cardiologist: Dr. Meda Coffee   Reason for Visit/CC: f/u for HTN  HPI:  Brianna Mccarty is a 77 y.o. female, previously followed by Dr. Ron Parker and now followed by Dr. Meda Coffee, with a h/o HTN, HLD, mild AS by echo 01/2017, chronic DD, normal LVEF at 60-65%, and carotid artery disease with minimal plaque on carotid US in 05/2015. Most recently, NST 01/2017 was low risk.   Most recently, she has had issues with increase in BP. She was seen by Truitt Merle, NP, 05/13/17 for this. BP had been running high and patient noted increased life stressors and admitted to eating out frequently. Her BP day of visit was 160/90. Little improvement at home after increasing the dose of her Hydralazine. Spironolactone was added to her regimen, however after initiation, she was admitted to the hospital for acute on chronic hyponatremia. Her presented with nausea and vomiting. No mental status changes. Na level dropped to 115.  HCTZ and spironolactone were discontinued and condition improved. Na level day of discharge was 128. Her BP has since been treated with amlodipine, losartan and HCTZ.  12/12/2017, the patient is coming after almost a year, in the interim she was hospitalized with hyponatremia secondary to hydrochlorothiazide and prolactin there were discontinued. Since then her room has increased to 141 and most recently 135. She has been feeling great. She is unable to take any statins as it causes her significant muscle cramping with spasms. She has tried atorvastatin, rosuvastatin and pravastatin that was the worst for her. She has been very active exercising and denies any chest pain or shortness of breath, she has no palpitations claudications or syncope. She recently underwent a sleep study that showed moderate to severe sleep apnea an C Pap was recommended but she prefers to be sized for mouthpiece by Dr. Toy Cookey.  Current  Meds  Medication Sig  . acyclovir (ZOVIRAX) 400 MG tablet TAKE 1 TABLET BY MOUTH 3 TIMES A DAY FOR 5DAYS FOR OUTBREAK  . albuterol (PROAIR HFA) 108 (90 BASE) MCG/ACT inhaler Inhale 2 puffs into the lungs every 6 (six) hours as needed for wheezing.  Marland Kitchen amLODipine (NORVASC) 10 MG tablet Take 1 tablet (10 mg total) by mouth daily.  . Ascorbic Acid (VITAMIN C) 500 MG tablet Take 500 mg by mouth daily.    Marland Kitchen aspirin 81 MG tablet Take 81 mg by mouth daily.    Marland Kitchen azelastine (ASTELIN) 0.1 % nasal spray Instill 2 sprays into each nostril two times a day as needed for allergies  . budesonide-formoterol (SYMBICORT) 160-4.5 MCG/ACT inhaler Inhale 2 puffs 2 (two) times daily into the lungs.  . Calcium Carbonate (CALCIUM 600) 1500 MG TABS Take 1 tablet by mouth daily.    . Cholecalciferol (VITAMIN D3) 5000 UNITS CAPS Take 5,000 Units by mouth daily.   . Cinnamon 500 MG TABS Take 1 tablet by mouth daily.   . clobetasol cream (TEMOVATE) 7.06 % Apply 1 application topically as needed (IRRITATION).  . Coenzyme Q10 (COQ10) 100 MG CAPS Take 1 capsule by mouth daily.    Marland Kitchen desmopressin (DDAVP) 0.2 MG tablet Take 3 tablets by mouth daily at bedtime   . fexofenadine (ALLEGRA) 180 MG tablet Take 180 mg by mouth daily as needed for allergies or rhinitis.  . hydrALAZINE (APRESOLINE) 50 MG tablet Take 1 tablet by mouth three times a day  . hydrOXYzine (ATARAX/VISTARIL) 10 MG tablet Take 10  mg by mouth daily.  Marland Kitchen losartan (COZAAR) 100 MG tablet TAKE 1 TABLET BY MOUTH EVERY DAY *NEEDS APPT*  . magnesium oxide (MAG-OX) 400 MG tablet Take 400 mg by mouth daily.    . Multiple Vitamins-Minerals (OCUVITE ADULT 50+) CAPS Take 1 capsule by mouth daily.  Marland Kitchen omeprazole (PRILOSEC) 40 MG capsule TAKE 1 CAPSULE BY MOUTH EVERY DAY  . pentosan polysulfate (ELMIRON) 100 MG capsule Take 100 mg by mouth 3 (three) times daily before meals.    . pravastatin (PRAVACHOL) 20 MG tablet Take 20 mg daily by mouth.  . prazosin (MINIPRESS) 5 MG capsule  TAKE ONE CAPSULE BY MOUTH TWICE A DAY  . Probiotic Product (PROBIOTIC PO) Take 1 capsule by mouth daily.    Allergies  Allergen Reactions  . Cephalexin     Reaction was a high fever  . Penicillins Hives and Swelling    Swelling of arms & face Has patient had a PCN reaction causing immediate rash, facial/tongue/throat swelling, SOB or lightheadedness with hypotension: Yes Has patient had a PCN reaction causing severe rash involving mucus membranes or skin necrosis: No Has patient had a PCN reaction that required hospitalization: No Has patient had a PCN reaction occurring within the last 10 years: No If all of the above answers are "NO", then may proceed with Cephalosporin use.   Marland Kitchen Phenobarbital Hives  . Tape Rash   Past Medical History:  Diagnosis Date  . Abnormal EKG    Normal LV function in the past  . Allergic rhinitis   . Asthma   . Bronchitis, mucopurulent recurrent ([place])   . Carotid artery disease ([place])    Doppler, April, 2009, 0-39% bilateral  . Cervical dysplasia 1971  . Chest pain, unspecified   . Diverticulosis of colon   . DJD (degenerative joint disease)   . Ejection fraction    EF 60%, echo, 2009  . GERD (gastroesophageal reflux disease)   . Headache(784.0)   . Hypercholesterolemia   . Hypertension   . Interstitial cystitis    sees urologist  . Lichen sclerosus   . Malignant melanoma ([place])    sees Dr. [place] Crane in dermatology  . Migraines   . Mitral valve disease    Question mitral valve prolapse in the past, no prolapse by echo 2009  . Murmur 10/20/2015  . Osteoporosis    on fosomax > 5 years, stopped 11/2015  . Renal calculus    sees urologist  . Thyroid cyst    1 x 1.1 thyroid cyst noted on carotid Doppler, January, 2012  . Venous insufficiency    Family History  Problem Relation Age of Onset  . Cancer Father        Pancreatic  . Diabetes Mother   . Heart disease Mother   . Cancer Brother        Bile duct  . Diabetes Brother   . Hypertension  Brother   . Diabetes Brother   . Heart disease Brother   . Hypertension Brother   . Hypertension Sister   . Hypertension Sister   . Colon cancer Paternal Uncle    Past Surgical History:  Procedure Laterality Date  . BREAST BIOPSY Left 11/25/2011   U/S core, benign performed at [facility]  . BREAST CYST ASPIRATION    . BREAST SURGERY  2013   Breast Bx-Benign  . CARDIAC CATHETERIZATION    . CATARACT EXTRACTION, BILATERAL    . cystoscopy and basket stone removal right ureter  02/2006   Dr.  Terance Hart  . GYNECOLOGIC CRYOSURGERY  1971  . left total hip replacement  2004   Dr. Gladstone Lighter  . melanoma removed from medial rleft knee area  2006   Dr. [place] Crane  . NASAL SEPTUM SURGERY     Social History   Socioeconomic History  . Marital status: Married    Spouse name: Edison Nasuti  . Number of children: 2  . Years of education: Not on file  . Highest education level: Not on file  Social Needs  . Financial resource strain: Not on file  . Food insecurity - worry: Not on file  . Food insecurity - inability: Not on file  . Transportation needs - medical: Not on file  . Transportation needs - non-medical: Not on file  Occupational History  . Occupation: retired    Fish farm manager: RETIRED  Tobacco Use  . Smoking status: Never Smoker  . Smokeless tobacco: Never Used  Substance and Sexual Activity  . Alcohol use: Yes    Alcohol/week: 0.6 oz    Types: 1 Standard drinks or equivalent per week    Comment: social use  . Drug use: No  . Sexual activity: No    Birth control/protection: Post-menopausal    Comment: 1st intercourse 66 yo-1 partner  Other Topics Concern  . Not on file  Social History Narrative   Updated 11/2015   Work or School: none      Home Situation: lives with husband      Spiritual Beliefs: christian      Lifestyle: regular exercise; healthy diet        Review of Systems: General: negative for chills, fever, night sweats or weight changes.  Cardiovascular: negative for chest pain,  dyspnea on exertion, edema, orthopnea, palpitations, paroxysmal nocturnal dyspnea or shortness of breath Dermatological: negative for rash Respiratory: negative for cough or wheezing Urologic: negative for hematuria Abdominal: negative for nausea, vomiting, diarrhea, bright red blood per rectum, melena, or hematemesis Neurologic: negative for visual changes, syncope, or dizziness All other systems reviewed and are otherwise negative except as noted above.   Physical Exam:  Blood pressure 130/84, pulse 64, height 5\' 2"  (1.575 m), weight 183 lb (83 kg), SpO2 98 %.  General appearance: alert, cooperative and no distress Neck: no carotid bruit and no JVD Lungs: clear to auscultation bilaterally Heart: regular rate and rhythm and 2/6 SM, loudest at RUSB Extremities: extremities normal, atraumatic, no cyanosis or edema Pulses: 2+ and symmetric Skin: Skin color, texture, turgor normal. No rashes or lesions Neurologic: Grossly normal  EKG shows normal sinus rhythm with poor R-wave progressions in the anterior leads, unchanged from prior- personally reviewed     ASSESSMENT AND PLAN:   1. Hyponatremia: Na level dropped to 115, in the setting of diuretic use with HCTZ and spironolactone, now resolved.  2. Lower extremity edema - occasional, she would like to restart diuretics, will give Lasix 20 mg daily as needed she is advised that if she needs to take it more than twice of each bolus and we'll repeat her labs.  3. HTN: well controlled on current regimen of CCB, ARB and hydralazine.   4. Bilateral Carotid Artery Disease: minimal plaque on carotid US in 05/2015. We will repeat next year she has no bruits.   5. Aortic Stenosis: mild by recent echo 01/2017. She denies CP and dyspnea. Her murmur sounds louder however she still has very prominent S2, we'll see her in 6 months and repeat echocardiogram prior to that visit.  6. OSA: Moderate  to severe on most recent sleep study, she is going to  meet with Dr. Toy Cookey for mouth piece and it fails she'll go with C-PAP.  7. Hyperlipidemia - significant side effects with atorvastatin, rosuvastatin and most recently pravastatin, will refer to lipid clinic.   PLAN  F/u with Dr. Meda Coffee in 6 months    Ena Dawley , MD [place] 12/12/2017 2:29 PM

## 2017-12-12 NOTE — Patient Instructions (Addendum)
Medication Instructions:  1) START LASIX 20 mg daily as needed   Labwork: None  Testing/Procedures: Your provider has requested that you have an echocardiogram in 6 months prior to your appointment with Dr. Meda Coffee. Echocardiography is a painless test that uses sound waves to create images of your heart. It provides your doctor with information about the size and shape of your heart and how well your heart's chambers and valves are working. This procedure takes approximately one hour. There are no restrictions for this procedure.  Follow-Up: You have been referred to LIPID CLINIC.  Your provider wants you to follow-up in: 6 months with Dr. Radford Pax. You will receive a reminder letter in the mail two months in advance. If you don't receive a letter, please call our office to schedule the follow-up appointment.    Any Other Special Instructions Will Be Listed Below (If Applicable). Dr. Augustina Mood (Dentist):  Phone: [phone]  Appointments: [email or web address]   If you need a refill on your cardiac medications before your next appointment, please call your pharmacy.

## 2017-12-19 ENCOUNTER — Other Ambulatory Visit: Payer: Self-pay | Admitting: Cardiovascular Disease

## 2017-12-19 ENCOUNTER — Ambulatory Visit: Payer: PPO | Admitting: Cardiology

## 2017-12-19 DIAGNOSIS — J3089 Other allergic rhinitis: Secondary | ICD-10-CM | POA: Diagnosis not present

## 2017-12-19 DIAGNOSIS — J301 Allergic rhinitis due to pollen: Secondary | ICD-10-CM | POA: Diagnosis not present

## 2017-12-23 DIAGNOSIS — J301 Allergic rhinitis due to pollen: Secondary | ICD-10-CM | POA: Diagnosis not present

## 2017-12-23 DIAGNOSIS — J3089 Other allergic rhinitis: Secondary | ICD-10-CM | POA: Diagnosis not present

## 2018-01-01 ENCOUNTER — Ambulatory Visit (INDEPENDENT_AMBULATORY_CARE_PROVIDER_SITE_OTHER): Payer: PPO | Admitting: Pharmacist

## 2018-01-01 ENCOUNTER — Encounter (INDEPENDENT_AMBULATORY_CARE_PROVIDER_SITE_OTHER): Payer: Self-pay

## 2018-01-01 DIAGNOSIS — E7800 Pure hypercholesterolemia, unspecified: Secondary | ICD-10-CM

## 2018-01-01 DIAGNOSIS — J3089 Other allergic rhinitis: Secondary | ICD-10-CM | POA: Diagnosis not present

## 2018-01-01 DIAGNOSIS — J301 Allergic rhinitis due to pollen: Secondary | ICD-10-CM | POA: Diagnosis not present

## 2018-01-01 MED ORDER — ROSUVASTATIN CALCIUM 5 MG PO TABS: 5.0000 mg | ORAL_TABLET | Freq: Every day | ORAL | 11 refills | Status: DC

## 2018-01-01 NOTE — Progress Notes (Signed)
Patient ID: Brianna Mccarty                 DOB: 12/15/1940                    MRN: [id number]     HPI: Brianna Mccarty is a 77 y.o. female patient referred to lipid clinic by Dr Meda Coffee. PMH is significant for HTN, HLD, mild AS by echo 01/2017, chronic DD, normal LVEF at 60-65%, OSA, and CAD with minimal plaque on carotid US in 05/2015, stable bilateral ICA stenosis 1-39% on 07/2017 carotid ultrasound. Patient has a history of statin intolerance and presents to lipid clinic for further management.  Pt has previously tried rosuvastatin (unknown dose), pravastatin 20mg , and simvastatin 20mg . She experienced bilateral myalgias in her thighs within a few days of starting statin therapy in each case. She stopped taking her pravastatin 2-3 weeks ago and has been feeling much better. She eats a low fat diet and uses her treadmill 3-4 days per week.  Current Medications: none, discontinued pravastatin 2-3 weeks ago Intolerances: Rosuvastatin, pravastatin 20mg  daily, simvastatin 20mg  daily - myalgias Risk Factors: CAD, HTN, age LDL goal: 70mg /dL  Diet: Tries to avoid fatty foods and does not eat fried food. Does not eat red meat, eats fish and chicken with vegetables and fruit. Does not eat sweets.   Exercise: Uses the treadmill 3-4 days per week. Walks 2 miles each time.  Family History: Mother with hx of DM and heart disease. Multiple brothers with hx of DM and HTN, sister with HTN.  Social History: Denies tobacco and illicit drug use, drinks alcohol occasionally.  Labs: 01/23/17: TC 200, TG 34, HDL 81, LDL 112 (pravastatin 20mg  daily)  Past Medical History:  Diagnosis Date  . Abnormal EKG    Normal LV function in the past  . Allergic rhinitis   . Asthma   . Bronchitis, mucopurulent recurrent ([place])   . Carotid artery disease ([place])    Doppler, April, 2009, 0-39% bilateral  . Cervical dysplasia 1971  . Chest pain, unspecified   . Diverticulosis of colon   . DJD (degenerative joint disease)     . Ejection fraction    EF 60%, echo, 2009  . GERD (gastroesophageal reflux disease)   . Headache(784.0)   . Hypercholesterolemia   . Hypertension   . Interstitial cystitis    sees urologist  . Lichen sclerosus   . Malignant melanoma ([place])    sees Dr. [place] Crane in dermatology  . Migraines   . Mitral valve disease    Question mitral valve prolapse in the past, no prolapse by echo 2009  . Murmur 10/20/2015  . Osteoporosis    on fosomax > 5 years, stopped 11/2015  . Renal calculus    sees urologist  . Thyroid cyst    1 x 1.1 thyroid cyst noted on carotid Doppler, January, 2012  . Venous insufficiency     Current Outpatient Medications on File Prior to Visit  Medication Sig Dispense Refill  . acyclovir (ZOVIRAX) 400 MG tablet TAKE 1 TABLET BY MOUTH 3 TIMES A DAY FOR 5DAYS FOR OUTBREAK 15 tablet 3  . albuterol (PROAIR HFA) 108 (90 BASE) MCG/ACT inhaler Inhale 2 puffs into the lungs every 6 (six) hours as needed for wheezing. 3 Inhaler 0  . albuterol (PROVENTIL) (2.5 MG/3ML) 0.083% nebulizer solution Take 3 mLs (2.5 mg total) by nebulization every 6 (six) hours as needed. 360 mL 0  . amLODipine (NORVASC) 10 MG  tablet Take 1 tablet (10 mg total) by mouth daily. 90 tablet 1  . Ascorbic Acid (VITAMIN C) 500 MG tablet Take 500 mg by mouth daily.      Marland Kitchen aspirin 81 MG tablet Take 81 mg by mouth daily.      Marland Kitchen azelastine (ASTELIN) 0.1 % nasal spray Instill 2 sprays into each nostril two times a day as needed for allergies  5  . budesonide-formoterol (SYMBICORT) 160-4.5 MCG/ACT inhaler Inhale 2 puffs 2 (two) times daily into the lungs. 3 Inhaler 3  . Calcium Carbonate (CALCIUM 600) 1500 MG TABS Take 1 tablet by mouth daily.      . Cholecalciferol (VITAMIN D3) 5000 UNITS CAPS Take 5,000 Units by mouth daily.     . Cinnamon 500 MG TABS Take 1 tablet by mouth daily.     . clobetasol cream (TEMOVATE) 0.86 % Apply 1 application topically as needed (IRRITATION). 30 g 2  . Coenzyme Q10 (COQ10) 100 MG CAPS  Take 1 capsule by mouth daily.      Marland Kitchen desmopressin (DDAVP) 0.2 MG tablet Take 3 tablets by mouth daily at bedtime     . fexofenadine (ALLEGRA) 180 MG tablet Take 180 mg by mouth daily as needed for allergies or rhinitis.    . furosemide (LASIX) 20 MG tablet Take 1 tablet (20 mg total) by mouth daily as needed. 30 tablet 4  . hydrALAZINE (APRESOLINE) 50 MG tablet TAKE 1 TABLET BY MOUTH 3 TIMES A DAY AND TAKE EXTRA TABLET IF SBP>160 360 tablet 3  . hydrOXYzine (ATARAX/VISTARIL) 10 MG tablet Take 10 mg by mouth daily.    Marland Kitchen losartan (COZAAR) 100 MG tablet TAKE 1 TABLET BY MOUTH EVERY DAY *NEEDS APPT* 30 tablet 5  . magnesium oxide (MAG-OX) 400 MG tablet Take 400 mg by mouth daily.      . Multiple Vitamins-Minerals (OCUVITE ADULT 50+) CAPS Take 1 capsule by mouth daily.    Marland Kitchen omeprazole (PRILOSEC) 40 MG capsule TAKE 1 CAPSULE BY MOUTH EVERY DAY 90 capsule 0  . pentosan polysulfate (ELMIRON) 100 MG capsule Take 100 mg by mouth 3 (three) times daily before meals.      . pravastatin (PRAVACHOL) 20 MG tablet Take 20 mg daily by mouth.    . prazosin (MINIPRESS) 5 MG capsule TAKE ONE CAPSULE BY MOUTH TWICE A DAY 180 capsule 1  . Probiotic Product (PROBIOTIC PO) Take 1 capsule by mouth daily.      No current facility-administered medications on file prior to visit.     Allergies  Allergen Reactions  . Cephalexin     Reaction was a high fever  . Penicillins Hives and Swelling    Swelling of arms & face Has patient had a PCN reaction causing immediate rash, facial/tongue/throat swelling, SOB or lightheadedness with hypotension: Yes Has patient had a PCN reaction causing severe rash involving mucus membranes or skin necrosis: No Has patient had a PCN reaction that required hospitalization: No Has patient had a PCN reaction occurring within the last 10 years: No If all of the above answers are "NO", then may proceed with Cephalosporin use.   Marland Kitchen Phenobarbital Hives  . Tape Rash     Assessment/Plan:  1. Hyperlipidemia - LDL previously 112mg /dL on pravastatin 20mg  daily above aggressive goal < 70mg /dL due to hx of mild CAD. She is previously intolerant to unknown dose of rosuvastatin, pravastatin 20mg , and simvastatin 20mg  daily. Discussed rechallenge with statin therapy, Zetia, and PCSK9i therapy. Pt would not like to  pursue PCSK9i therapy at this time. Will rechallenge with low dose rosuvastatin 5mg  every other day with plan to self-titrate to 5mg  daily if tolerable. Will f/u with pt via telephone in 1 month to assess tolerability. Can add Zetia if needed.   Arien Morine E. Lashika Erker, PharmD, CPP, [place] [address], [place], [place] [address] Phone: [phone]; Fax: [phone] 01/01/2018 2:07 PM

## 2018-01-01 NOTE — Patient Instructions (Addendum)
It was nice to meet you today  Please start taking rosuvastatin (Crestor) 5mg . You can start by taking it every other day and increase to every day  Call Jamieson Lisa in the lipid clinic with any side effects [phone]  We will plan to recheck your cholesterol in 2-3 months

## 2018-01-02 DIAGNOSIS — J3089 Other allergic rhinitis: Secondary | ICD-10-CM | POA: Diagnosis not present

## 2018-01-02 DIAGNOSIS — J301 Allergic rhinitis due to pollen: Secondary | ICD-10-CM | POA: Diagnosis not present

## 2018-01-03 ENCOUNTER — Other Ambulatory Visit: Payer: Self-pay | Admitting: Family Medicine

## 2018-01-04 ENCOUNTER — Other Ambulatory Visit: Payer: Self-pay | Admitting: Family Medicine

## 2018-01-08 DIAGNOSIS — J301 Allergic rhinitis due to pollen: Secondary | ICD-10-CM | POA: Diagnosis not present

## 2018-01-08 DIAGNOSIS — J3089 Other allergic rhinitis: Secondary | ICD-10-CM | POA: Diagnosis not present

## 2018-01-12 DIAGNOSIS — J3089 Other allergic rhinitis: Secondary | ICD-10-CM | POA: Diagnosis not present

## 2018-01-12 DIAGNOSIS — J301 Allergic rhinitis due to pollen: Secondary | ICD-10-CM | POA: Diagnosis not present

## 2018-01-12 DIAGNOSIS — Z1231 Encounter for screening mammogram for malignant neoplasm of breast: Secondary | ICD-10-CM | POA: Diagnosis not present

## 2018-01-13 ENCOUNTER — Telehealth: Payer: Self-pay | Admitting: *Deleted

## 2018-01-13 NOTE — Telephone Encounter (Signed)
Letter came from Dr Ginette Pitman DSM ,DDS stating this patient (patient of Dr Ena Dawley) was seen their office on December 31 2017 for an initial consultation for oral appliance therapy. In Dr Betsey Holiday assessment she states the patient is a candidate for oral appliance therapy for OSA.  Dr Toy Cookey is requesting an order for the patient for an oral appliance.

## 2018-01-13 NOTE — Telephone Encounter (Signed)
No problem.

## 2018-01-13 NOTE — Telephone Encounter (Signed)
Sorry about that.  Brianna Mccarty, that patient will need to see me before she can see Dr. Toy Cookey or she will have to self refer herself to Dr. Toy Cookey.  I cannot refer her without seeing her first.

## 2018-01-13 NOTE — Telephone Encounter (Signed)
Thank you [name].

## 2018-01-13 NOTE — Telephone Encounter (Signed)
[name], you read her sleep study and recommended CPAP, she stated that she wanted to see Dr Toy Cookey for an oral appliance ( I have no idea what it is) and if it fails then will try CPAP. [name], please call her and ask what she really wants, this is outside of my area of knowledge.

## 2018-01-13 NOTE — Telephone Encounter (Signed)
Since I have never seen this patient this will need to come from Dr. Meda Coffee

## 2018-01-15 DIAGNOSIS — J301 Allergic rhinitis due to pollen: Secondary | ICD-10-CM | POA: Diagnosis not present

## 2018-01-15 DIAGNOSIS — J3089 Other allergic rhinitis: Secondary | ICD-10-CM | POA: Diagnosis not present

## 2018-01-20 DIAGNOSIS — J301 Allergic rhinitis due to pollen: Secondary | ICD-10-CM | POA: Diagnosis not present

## 2018-01-20 DIAGNOSIS — J3089 Other allergic rhinitis: Secondary | ICD-10-CM | POA: Diagnosis not present

## 2018-01-22 DIAGNOSIS — J301 Allergic rhinitis due to pollen: Secondary | ICD-10-CM | POA: Diagnosis not present

## 2018-01-22 DIAGNOSIS — J3089 Other allergic rhinitis: Secondary | ICD-10-CM | POA: Diagnosis not present

## 2018-01-29 ENCOUNTER — Telehealth: Payer: Self-pay | Admitting: Pharmacist

## 2018-01-29 NOTE — Telephone Encounter (Signed)
Called pt to assess 1 month tolerability of Crestor 5mg  every other day. Pt states she has not started taking her Crestor yet but plans to start later this week. Advised pt we will touch base in another month to assess tolerability. Plan will be to increase Crestor to daily dosing and add Zetia if needed to target LDL < 70.

## 2018-01-30 DIAGNOSIS — J3089 Other allergic rhinitis: Secondary | ICD-10-CM | POA: Diagnosis not present

## 2018-01-30 DIAGNOSIS — J301 Allergic rhinitis due to pollen: Secondary | ICD-10-CM | POA: Diagnosis not present

## 2018-02-05 DIAGNOSIS — J301 Allergic rhinitis due to pollen: Secondary | ICD-10-CM | POA: Diagnosis not present

## 2018-02-05 DIAGNOSIS — J3089 Other allergic rhinitis: Secondary | ICD-10-CM | POA: Diagnosis not present

## 2018-02-12 ENCOUNTER — Encounter: Payer: Self-pay | Admitting: Family Medicine

## 2018-02-12 ENCOUNTER — Ambulatory Visit (INDEPENDENT_AMBULATORY_CARE_PROVIDER_SITE_OTHER): Payer: PPO | Admitting: Family Medicine

## 2018-02-12 VITALS — BP 120/82 | HR 68 | Temp 97.7°F | Ht 62.0 in | Wt 185.9 lb

## 2018-02-12 DIAGNOSIS — I779 Disorder of arteries and arterioles, unspecified: Secondary | ICD-10-CM | POA: Diagnosis not present

## 2018-02-12 DIAGNOSIS — I10 Essential (primary) hypertension: Secondary | ICD-10-CM | POA: Diagnosis not present

## 2018-02-12 DIAGNOSIS — I739 Peripheral vascular disease, unspecified: Secondary | ICD-10-CM

## 2018-02-12 DIAGNOSIS — G4733 Obstructive sleep apnea (adult) (pediatric): Secondary | ICD-10-CM | POA: Insufficient documentation

## 2018-02-12 DIAGNOSIS — E7800 Pure hypercholesterolemia, unspecified: Secondary | ICD-10-CM | POA: Diagnosis not present

## 2018-02-12 DIAGNOSIS — R739 Hyperglycemia, unspecified: Secondary | ICD-10-CM | POA: Insufficient documentation

## 2018-02-12 DIAGNOSIS — Z6834 Body mass index (BMI) 34.0-34.9, adult: Secondary | ICD-10-CM

## 2018-02-12 DIAGNOSIS — R7303 Prediabetes: Secondary | ICD-10-CM | POA: Insufficient documentation

## 2018-02-12 NOTE — Progress Notes (Signed)
HPI:  Using dictation device. Unfortunately this device frequently misinterprets words/phrases.  Brianna Mccarty is a pleasant 77 y.o. here for follow up. Chronic medical problems summarized below were reviewed for changes. Doing well. Wants to loose some weight. Is going to start exercising more and is eating healthier. No symptoms when does walk. Denies CP, SOB, DOE, treatment intolerance or new symptoms.  AWV 10/22/17  Obesity/hyperglycemia: -see hpi, has cut back on sweets  OSA: -seeing cardiologist for managment -mod-severe on study with cardiologist in 2018, CPAP titration pending  Anxiety and Depression: -much better as daughter is now doing better -resolved  HTN/Carotid Art Dz/HLD/venous insufficiency: -sees cardiologist -meds: norvasc, hydralazine, hctz, losartan, prazosin, asa, pravastatin  Asthma/Allergies: -meds: alb prn, azelastine nasal, Symbicort, allegra  GERD: -meds: prilosec 40mg   Osteoporosis: -on fosamax in the past, now on holiday per her preference since 2017  Melanoma: -sees dermatologist for management  IC: -sees urologist for management -meds: desmopressin, elmiron ROS: See pertinent positives and negatives per HPI.    ROS: See pertinent positives and negatives per HPI.  Past Medical History:  Diagnosis Date  . Abnormal EKG    Normal LV function in the past  . Allergic rhinitis   . Asthma   . Bronchitis, mucopurulent recurrent ([place])   . Carotid artery disease ([place])    Doppler, April, 2009, 0-39% bilateral  . Cervical dysplasia 1971  . Chest pain, unspecified   . Diverticulosis of colon   . DJD (degenerative joint disease)   . Ejection fraction    EF 60%, echo, 2009  . GERD (gastroesophageal reflux disease)   . Headache(784.0)   . Hypercholesterolemia   . Hypertension   . Interstitial cystitis    sees urologist  . Lichen sclerosus   . Malignant melanoma ([place])    sees Dr. Nevada Crane in dermatology  . Migraines   . Mitral  valve disease    Question mitral valve prolapse in the past, no prolapse by echo 2009  . Murmur 10/20/2015  . Osteoporosis    on fosomax > 5 years, stopped 11/2015  . Renal calculus    sees urologist  . Thyroid cyst    1 x 1.1 thyroid cyst noted on carotid Doppler, January, 2012  . Venous insufficiency     Past Surgical History:  Procedure Laterality Date  . BREAST BIOPSY Left 11/25/2011   [facility]  . BREAST CYST ASPIRATION    . BREAST SURGERY  2013   Breast Bx-Benign  . CARDIAC CATHETERIZATION    . CATARACT EXTRACTION, BILATERAL    . cystoscopy and basket stone removal right ureter  02/2006   Dr. Terance Hart  . GYNECOLOGIC CRYOSURGERY  1971  . left total hip replacement  2004   Dr. Gladstone Lighter  . melanoma removed from medial rleft knee area  2006   Dr. Nevada Crane  . NASAL SEPTUM SURGERY      Family History  Problem Relation Age of Onset  . Cancer Father        Pancreatic  . Diabetes Mother   . Heart disease Mother   . Cancer Brother        Bile duct  . Diabetes Brother   . Hypertension Brother   . Diabetes Brother   . Heart disease Brother   . Hypertension Brother   . Hypertension Sister   . Hypertension Sister   . Colon cancer Paternal Uncle     SOCIAL HX: see hpi   Current Outpatient Medications:  .  acyclovir (ZOVIRAX) 400 MG tablet, TAKE 1 TABLET BY MOUTH 3 TIMES A DAY FOR 5DAYS FOR OUTBREAK, Disp: 15 tablet, Rfl: 3 .  albuterol (PROAIR HFA) 108 (90 BASE) MCG/ACT inhaler, Inhale 2 puffs into the lungs every 6 (six) hours as needed for wheezing., Disp: 3 Inhaler, Rfl: 0 .  amLODipine (NORVASC) 10 MG tablet, Take 1 tablet (10 mg total) by mouth daily., Disp: 90 tablet, Rfl: 1 .  Ascorbic Acid (VITAMIN C) 500 MG tablet, Take 500 mg by mouth daily.  , Disp: , Rfl:  .  aspirin 81 MG tablet, Take 81 mg by mouth daily.  , Disp: , Rfl:  .  azelastine (ASTELIN) 0.1 % nasal spray, Instill 2 sprays into each nostril two times a day as needed for  allergies, Disp: , Rfl: 5 .  budesonide-formoterol (SYMBICORT) 160-4.5 MCG/ACT inhaler, Inhale 2 puffs 2 (two) times daily into the lungs., Disp: 3 Inhaler, Rfl: 3 .  Calcium Carbonate (CALCIUM 600) 1500 MG TABS, Take 1 tablet by mouth daily.  , Disp: , Rfl:  .  Cholecalciferol (VITAMIN D3) 5000 UNITS CAPS, Take 5,000 Units by mouth daily. , Disp: , Rfl:  .  Cinnamon 500 MG TABS, Take 1 tablet by mouth daily. , Disp: , Rfl:  .  clobetasol cream (TEMOVATE) 3.01 %, Apply 1 application topically as needed (IRRITATION)., Disp: 30 g, Rfl: 2 .  Coenzyme Q10 (COQ10) 100 MG CAPS, Take 1 capsule by mouth daily.  , Disp: , Rfl:  .  desmopressin (DDAVP) 0.2 MG tablet, Take 3 tablets by mouth daily at bedtime , Disp: , Rfl:  .  fexofenadine (ALLEGRA) 180 MG tablet, Take 180 mg by mouth daily as needed for allergies or rhinitis., Disp: , Rfl:  .  furosemide (LASIX) 20 MG tablet, Take 1 tablet (20 mg total) by mouth daily as needed., Disp: 30 tablet, Rfl: 4 .  hydrALAZINE (APRESOLINE) 50 MG tablet, TAKE 1 TABLET BY MOUTH 3 TIMES A DAY AND TAKE EXTRA TABLET IF SBP>160, Disp: 360 tablet, Rfl: 3 .  hydrOXYzine (ATARAX/VISTARIL) 10 MG tablet, Take 10 mg by mouth daily., Disp: , Rfl:  .  losartan (COZAAR) 100 MG tablet, TAKE 1 TABLET BY MOUTH EVERY DAY *NEEDS APPT*, Disp: 30 tablet, Rfl: 5 .  magnesium oxide (MAG-OX) 400 MG tablet, Take 400 mg by mouth daily.  , Disp: , Rfl:  .  Multiple Vitamins-Minerals (OCUVITE ADULT 50+) CAPS, Take 1 capsule by mouth daily., Disp: , Rfl:  .  omeprazole (PRILOSEC) 40 MG capsule, TAKE 1 CAPSULE BY MOUTH EVERY DAY *PT NEEDS APPT, Disp: 90 capsule, Rfl: 1 .  pentosan polysulfate (ELMIRON) 100 MG capsule, Take 100 mg by mouth 3 (three) times daily before meals.  , Disp: , Rfl:  .  prazosin (MINIPRESS) 5 MG capsule, TAKE ONE CAPSULE BY MOUTH TWICE A DAY, Disp: 180 capsule, Rfl: 1 .  Probiotic Product (PROBIOTIC PO), Take 1 capsule by mouth daily. , Disp: , Rfl:  .  rosuvastatin  (CRESTOR) 5 MG tablet, Take 1 tablet (5 mg total) by mouth daily., Disp: 30 tablet, Rfl: 11 .  albuterol (PROVENTIL) (2.5 MG/3ML) 0.083% nebulizer solution, Take 3 mLs (2.5 mg total) by nebulization every 6 (six) hours as needed., Disp: 360 mL, Rfl: 0  EXAM:  Vitals:   02/12/18 1257  BP: 120/82  Pulse: 68  Temp: 97.7 F (36.5 C)    Body mass index is 34 kg/m.  GENERAL: vitals reviewed and listed above, alert, oriented, appears well  hydrated and in no acute distress  HEENT: atraumatic, conjunttiva clear, no obvious abnormalities on inspection of external nose and ears  NECK: no obvious masses on inspection  LUNGS: clear to auscultation bilaterally, no wheezes, rales or rhonchi, good air movement  CV: HRRR, no peripheral edema  MS: moves all extremities without noticeable abnormality  PSYCH: pleasant and cooperative, no obvious depression or anxiety  ASSESSMENT AND PLAN:  Discussed the following assessment and plan:  Hyperglycemia  BMI 34.0-34.9,adult (obesity)  Bilateral carotid artery disease, unspecified type (HCC)  HYPERCHOLESTEROLEMIA  OSA (obstructive sleep apnea)  Essential hypertension  -seems to be doing well -counseled and supported on lifestyle changes, advised a healthy low sugar Mediterranean style diet - see pt instructions -advised regular exercise -labs next visit -follow up 3-4 months  Patient Instructions  BEFORE YOU LEAVE: -follow up: 3-4 months  Shoot for a 10 -20 lb weight reduction over the next 3-6 months.  We recommend the following healthy lifestyle for LIFE: 1) Small portions. But, make sure to get regular (at least 3 per day), healthy meals and small healthy snacks if needed.  2) Eat a healthy clean diet.   TRY TO EAT: -at least 5-7 servings of low sugar, colorful, and nutrient rich vegetables per day (not corn, potatoes or bananas.) -berries are the best choice if you wish to eat fruit (only eat small amounts if trying to  reduce weight)  -lean meets (fish, white meat of chicken or [place]) -vegan proteins for some meals - beans or tofu, whole grains, nuts and seeds -Replace bad fats with good fats - good fats include: fish, nuts and seeds, canola oil, olive oil -small amounts of low fat or non fat dairy -small amounts of100 % whole grains - check the lables -drink plenty of water  AVOID: -SUGAR, sweets, anything with added sugar, corn syrup or sweeteners - must read labels as even foods advertised as "healthy" often are loaded with sugar -if you must have a sweetener, small amounts of stevia may be best -sweetened beverages and artificially sweetened beverages -simple starches (rice, bread, potatoes, pasta, chips, etc - small amounts of 100% whole grains are ok) -red meat, pork, butter -fried foods, fast food, processed food, excessive dairy, eggs and coconut.  3)Get at least 150 minutes of sweaty aerobic exercise per week.  4)Reduce stress - consider counseling, meditation and relaxation to balance other aspects of your life.    Lucretia Kern, DO

## 2018-02-12 NOTE — Patient Instructions (Addendum)
BEFORE YOU LEAVE: -follow up: 3-4 months  Shoot for a 10 -20 lb weight reduction over the next 3-6 months.  We recommend the following healthy lifestyle for LIFE: 1) Small portions. But, make sure to get regular (at least 3 per day), healthy meals and small healthy snacks if needed.  2) Eat a healthy clean diet.   TRY TO EAT: -at least 5-7 servings of low sugar, colorful, and nutrient rich vegetables per day (not corn, potatoes or bananas.) -berries are the best choice if you wish to eat fruit (only eat small amounts if trying to reduce weight)  -lean meets (fish, white meat of chicken or [place]) -vegan proteins for some meals - beans or tofu, whole grains, nuts and seeds -Replace bad fats with good fats - good fats include: fish, nuts and seeds, canola oil, olive oil -small amounts of low fat or non fat dairy -small amounts of100 % whole grains - check the lables -drink plenty of water  AVOID: -SUGAR, sweets, anything with added sugar, corn syrup or sweeteners - must read labels as even foods advertised as "healthy" often are loaded with sugar -if you must have a sweetener, small amounts of stevia may be best -sweetened beverages and artificially sweetened beverages -simple starches (rice, bread, potatoes, pasta, chips, etc - small amounts of 100% whole grains are ok) -red meat, pork, butter -fried foods, fast food, processed food, excessive dairy, eggs and coconut.  3)Get at least 150 minutes of sweaty aerobic exercise per week.  4)Reduce stress - consider counseling, meditation and relaxation to balance other aspects of your life.

## 2018-02-16 DIAGNOSIS — J3089 Other allergic rhinitis: Secondary | ICD-10-CM | POA: Diagnosis not present

## 2018-02-16 DIAGNOSIS — J301 Allergic rhinitis due to pollen: Secondary | ICD-10-CM | POA: Diagnosis not present

## 2018-02-27 DIAGNOSIS — J3089 Other allergic rhinitis: Secondary | ICD-10-CM | POA: Diagnosis not present

## 2018-02-27 DIAGNOSIS — J301 Allergic rhinitis due to pollen: Secondary | ICD-10-CM | POA: Diagnosis not present

## 2018-03-04 DIAGNOSIS — J301 Allergic rhinitis due to pollen: Secondary | ICD-10-CM | POA: Diagnosis not present

## 2018-03-04 DIAGNOSIS — J3089 Other allergic rhinitis: Secondary | ICD-10-CM | POA: Diagnosis not present

## 2018-03-07 ENCOUNTER — Encounter: Payer: Self-pay | Admitting: Family Medicine

## 2018-03-07 ENCOUNTER — Ambulatory Visit (INDEPENDENT_AMBULATORY_CARE_PROVIDER_SITE_OTHER): Payer: PPO | Admitting: Family Medicine

## 2018-03-07 VITALS — BP 130/60 | HR 66 | Temp 97.5°F | Resp 16 | Ht 63.0 in | Wt 183.0 lb

## 2018-03-07 DIAGNOSIS — H1011 Acute atopic conjunctivitis, right eye: Secondary | ICD-10-CM | POA: Diagnosis not present

## 2018-03-07 MED ORDER — OLOPATADINE HCL 0.1 % OP SOLN: 1.0000 [drp] | Freq: Two times a day (BID) | OPHTHALMIC | 1 refills | Status: DC

## 2018-03-07 NOTE — Patient Instructions (Signed)
Allergic Conjunctivitis A clear membrane (conjunctiva) covers the white part of your eye and the inner surface of your eyelid. Allergic conjunctivitis happens when this membrane has inflammation. This is caused by allergies. Common causes of allergic reactions (allergens)include:  Outdoor allergens, such as:  Pollen.  Grass and weeds.  Mold spores.  Indoor allergens, such as:  Dust.  Smoke.  Mold.  Pet dander.  Animal hair. This condition can make your eye red or pink. It can also make your eye feel itchy. This condition cannot be spread from one person to another person (is not contagious). Follow these instructions at home:  Try not to be around things that you are allergic to.  Take or apply over-the-counter and prescription medicines only as told by your doctor. These include any eye drops.  Place a cool, clean washcloth on your eye for 10-20 minutes. Do this 3-4 times a day.  Do not touch or rub your eyes.  Do not wear contact lenses until the inflammation is gone. Wear glasses instead.  Do not wear eye makeup until the inflammation is gone.  Keep all follow-up visits as told by your doctor. This is important. Contact a doctor if:  Your symptoms get worse.  Your symptoms do not get better with treatment.  You have mild eye pain.  You are sensitive to light,  You have spots or blisters on your eyes.  You have pus coming from your eye.  You have a fever. Get help right away if:  You have redness, swelling, or other symptoms in only one eye.  Your vision is blurry.  You have vision changes.  You have very bad eye pain. Summary  Allergic conjunctivitis is caused by allergies. It can make your eye red or pink, and it can make your eye feel itchy.  This condition cannot be spread from one person to another person (is not contagious).  Try not to be around things that you are allergic to.  Take or apply over-the-counter and prescription medicines  only as told by your doctor. These include any eye drops.  Contact your doctor if your symptoms get worse or they do not get better with treatment. This information is not intended to replace advice given to you by your health care provider. Make sure you discuss any questions you have with your health care provider. Document Released: 04/10/2010 Document Revised: 06/14/2016 Document Reviewed: 06/14/2016 Elsevier Interactive Patient Education  2017 [employer] Inc[employer]  

## 2018-03-07 NOTE — Progress Notes (Signed)
SUBJECTIVE:  77 y.o. female with burning and mattering in right eye for 2 days.  No other symptoms.  No significant prior ophthalmological history. No change in visual acuity, no photophobia, no severe eye pain.  She does have seasonal allergies - takes allegra for this- and was outside working in the yard prior to developing symptoms.  Current Outpatient Medications on File Prior to Visit  Medication Sig Dispense Refill  . acyclovir (ZOVIRAX) 400 MG tablet TAKE 1 TABLET BY MOUTH 3 TIMES A DAY FOR 5DAYS FOR OUTBREAK 15 tablet 3  . albuterol (PROAIR HFA) 108 (90 BASE) MCG/ACT inhaler Inhale 2 puffs into the lungs every 6 (six) hours as needed for wheezing. 3 Inhaler 0  . amLODipine (NORVASC) 10 MG tablet Take 1 tablet (10 mg total) by mouth daily. 90 tablet 1  . Ascorbic Acid (VITAMIN C) 500 MG tablet Take 500 mg by mouth daily.      Marland Kitchen aspirin 81 MG tablet Take 81 mg by mouth daily.      Marland Kitchen azelastine (ASTELIN) 0.1 % nasal spray Instill 2 sprays into each nostril two times a day as needed for allergies  5  . budesonide-formoterol (SYMBICORT) 160-4.5 MCG/ACT inhaler Inhale 2 puffs 2 (two) times daily into the lungs. 3 Inhaler 3  . Calcium Carbonate (CALCIUM 600) 1500 MG TABS Take 1 tablet by mouth daily.      . Cholecalciferol (VITAMIN D3) 5000 UNITS CAPS Take 5,000 Units by mouth daily.     . Cinnamon 500 MG TABS Take 1 tablet by mouth daily.     . clobetasol cream (TEMOVATE) 6.94 % Apply 1 application topically as needed (IRRITATION). 30 g 2  . Coenzyme Q10 (COQ10) 100 MG CAPS Take 1 capsule by mouth daily.      Marland Kitchen desmopressin (DDAVP) 0.2 MG tablet Take 3 tablets by mouth daily at bedtime     . fexofenadine (ALLEGRA) 180 MG tablet Take 180 mg by mouth daily as needed for allergies or rhinitis.    . furosemide (LASIX) 20 MG tablet Take 1 tablet (20 mg total) by mouth daily as needed. 30 tablet 4  . hydrALAZINE (APRESOLINE) 50 MG tablet TAKE 1 TABLET BY MOUTH 3 TIMES A DAY AND TAKE EXTRA TABLET IF  SBP>160 360 tablet 3  . hydrOXYzine (ATARAX/VISTARIL) 10 MG tablet Take 10 mg by mouth daily.    Marland Kitchen losartan (COZAAR) 100 MG tablet TAKE 1 TABLET BY MOUTH EVERY DAY *NEEDS APPT* 30 tablet 5  . magnesium oxide (MAG-OX) 400 MG tablet Take 400 mg by mouth daily.      . Multiple Vitamins-Minerals (OCUVITE ADULT 50+) CAPS Take 1 capsule by mouth daily.    Marland Kitchen omeprazole (PRILOSEC) 40 MG capsule TAKE 1 CAPSULE BY MOUTH EVERY DAY *PT NEEDS APPT 90 capsule 1  . pentosan polysulfate (ELMIRON) 100 MG capsule Take 100 mg by mouth 3 (three) times daily before meals.      . prazosin (MINIPRESS) 5 MG capsule TAKE ONE CAPSULE BY MOUTH TWICE A DAY 180 capsule 1  . Probiotic Product (PROBIOTIC PO) Take 1 capsule by mouth daily.     . rosuvastatin (CRESTOR) 5 MG tablet Take 1 tablet (5 mg total) by mouth daily. 30 tablet 11  . albuterol (PROVENTIL) (2.5 MG/3ML) 0.083% nebulizer solution Take 3 mLs (2.5 mg total) by nebulization every 6 (six) hours as needed. 360 mL 0   No current facility-administered medications on file prior to visit.     Allergies  Allergen Reactions  .  Cephalexin     Reaction was a high fever  . Penicillins Hives and Swelling    Swelling of arms & face Has patient had a PCN reaction causing immediate rash, facial/tongue/throat swelling, SOB or lightheadedness with hypotension: Yes Has patient had a PCN reaction causing severe rash involving mucus membranes or skin necrosis: No Has patient had a PCN reaction that required hospitalization: No Has patient had a PCN reaction occurring within the last 10 years: No If all of the above answers are "NO", then may proceed with Cephalosporin use.   Marland Kitchen Phenobarbital Hives  . Tape Rash    Past Medical History:  Diagnosis Date  . Abnormal EKG    Normal LV function in the past  . Allergic rhinitis   . Asthma   . Bronchitis, mucopurulent recurrent ([place])   . Carotid artery disease ([place])    Doppler, April, 2009, 0-39% bilateral  . Cervical  dysplasia 1971  . Chest pain, unspecified   . Diverticulosis of colon   . DJD (degenerative joint disease)   . Ejection fraction    EF 60%, echo, 2009  . GERD (gastroesophageal reflux disease)   . Headache(784.0)   . Hypercholesterolemia   . Hypertension   . Interstitial cystitis    sees urologist  . Lichen sclerosus   . Malignant melanoma ([place])    sees Dr. Nevada Crane in dermatology  . Migraines   . Mitral valve disease    Question mitral valve prolapse in the past, no prolapse by echo 2009  . Murmur 10/20/2015  . Osteoporosis    on fosomax > 5 years, stopped 11/2015  . Renal calculus    sees urologist  . Thyroid cyst    1 x 1.1 thyroid cyst noted on carotid Doppler, January, 2012  . Venous insufficiency     Past Surgical History:  Procedure Laterality Date  . BREAST BIOPSY Left 11/25/2011   U/S core, benign performed at [facility]  . BREAST CYST ASPIRATION    . BREAST SURGERY  2013   Breast Bx-Benign  . CARDIAC CATHETERIZATION    . CATARACT EXTRACTION, BILATERAL    . cystoscopy and basket stone removal right ureter  02/2006   Dr. Terance Hart  . GYNECOLOGIC CRYOSURGERY  1971  . left total hip replacement  2004   Dr. Gladstone Lighter  . melanoma removed from medial rleft knee area  2006   Dr. Nevada Crane  . NASAL SEPTUM SURGERY      Family History  Problem Relation Age of Onset  . Cancer Father        Pancreatic  . Diabetes Mother   . Heart disease Mother   . Cancer Brother        Bile duct  . Diabetes Brother   . Hypertension Brother   . Diabetes Brother   . Heart disease Brother   . Hypertension Brother   . Hypertension Sister   . Hypertension Sister   . Colon cancer Paternal Uncle     Social History   Socioeconomic History  . Marital status: Married    Spouse name: Edison Nasuti  . Number of children: 2  . Years of education: Not on file  . Highest education level: Not on file  Occupational History  . Occupation: retired    Fish farm manager: RETIRED  Social Needs  . Financial resource  strain: Not on file  . Food insecurity:    Worry: Not on file    Inability: Not on file  . Transportation needs:  Medical: Not on file    Non-medical: Not on file  Tobacco Use  . Smoking status: Never Smoker  . Smokeless tobacco: Never Used  Substance and Sexual Activity  . Alcohol use: Yes    Alcohol/week: 0.6 oz    Types: 1 Standard drinks or equivalent per week    Comment: social use  . Drug use: No  . Sexual activity: Never    Birth control/protection: Post-menopausal    Comment: 1st intercourse 35 yo-1 partner  Lifestyle  . Physical activity:    Days per week: Not on file    Minutes per session: Not on file  . Stress: Not on file  Relationships  . Social connections:    Talks on phone: Not on file    Gets together: Not on file    Attends religious service: Not on file    Active member of club or organization: Not on file    Attends meetings of clubs or organizations: Not on file    Relationship status: Not on file  . Intimate partner violence:    Fear of current or ex partner: Not on file    Emotionally abused: Not on file    Physically abused: Not on file    Forced sexual activity: Not on file  Other Topics Concern  . Not on file  Social History Narrative   Updated 11/2015   Work or School: none      Home Situation: lives with husband      Spiritual Beliefs: christian      Lifestyle: regular exercise; healthy diet      The PMH, PSH, Social History, Family History, Medications, and allergies have been reviewed in [facility], and have been updated if relevant.  OBJECTIVE:  BP 130/60 (BP Location: Left Arm, Patient Position: Sitting, Cuff Size: Large)   Pulse 66   Temp (!) 97.5 F (36.4 C) (Oral)   Resp 16   Ht 5\' 3"  (1.6 m)   Wt 183 lb (83 kg)   SpO2 94%   BMI 32.42 kg/m   Patient appears well, vitals signs are normal. Eyes: right eye with findings of typical conjunctivitis noted; mild erythema, no dicharge. PERRLA, no foreign body noted. No periorbital  cellulitis. The corneas are clear and fundi normal. Visual acuity normal.   ASSESSMENT:  Conjunctivitis - probably allergic.  PLAN:  Antihistamine drops per order. Supportive care as per AVS. Call or return to clinic prn if these symptoms worsen or fail to improve as anticipated. The patient indicates understanding of these issues and agrees with the plan.

## 2018-03-09 DIAGNOSIS — H00022 Hordeolum internum right lower eyelid: Secondary | ICD-10-CM | POA: Diagnosis not present

## 2018-03-11 DIAGNOSIS — J301 Allergic rhinitis due to pollen: Secondary | ICD-10-CM | POA: Diagnosis not present

## 2018-03-11 DIAGNOSIS — J3089 Other allergic rhinitis: Secondary | ICD-10-CM | POA: Diagnosis not present

## 2018-03-18 DIAGNOSIS — J3089 Other allergic rhinitis: Secondary | ICD-10-CM | POA: Diagnosis not present

## 2018-03-18 DIAGNOSIS — J301 Allergic rhinitis due to pollen: Secondary | ICD-10-CM | POA: Diagnosis not present

## 2018-03-19 DIAGNOSIS — H00022 Hordeolum internum right lower eyelid: Secondary | ICD-10-CM | POA: Diagnosis not present

## 2018-03-24 ENCOUNTER — Telehealth: Payer: Self-pay | Admitting: Pharmacist

## 2018-03-24 DIAGNOSIS — E782 Mixed hyperlipidemia: Secondary | ICD-10-CM

## 2018-03-24 NOTE — Telephone Encounter (Signed)
LMOM for pt to assess tolerability of Crestor 5mg  every other day. Will plan to either increase Crestor to daily dosing or add Zetia if needed to target LDL < 70.

## 2018-03-25 NOTE — Addendum Note (Signed)
Addended by: Chealsey Miyamoto E on: 03/25/2018 04:51 PM   Modules accepted: Orders

## 2018-03-25 NOTE — Telephone Encounter (Signed)
Pt returned call to clinic - reports tolerating Crestor 5mg  every other day mostly well. She has had a few minor cramps in her legs. She does not wish to increase frequency of her Crestor dosing at this time. Scheduled f/u lipid panel in 2 months and can add Zetia at that time if further LDL lowering is needed.

## 2018-03-27 DIAGNOSIS — J301 Allergic rhinitis due to pollen: Secondary | ICD-10-CM | POA: Diagnosis not present

## 2018-03-27 DIAGNOSIS — J3089 Other allergic rhinitis: Secondary | ICD-10-CM | POA: Diagnosis not present

## 2018-03-27 DIAGNOSIS — H00022 Hordeolum internum right lower eyelid: Secondary | ICD-10-CM | POA: Diagnosis not present

## 2018-03-27 DIAGNOSIS — H00021 Hordeolum internum right upper eyelid: Secondary | ICD-10-CM | POA: Diagnosis not present

## 2018-04-03 DIAGNOSIS — J3089 Other allergic rhinitis: Secondary | ICD-10-CM | POA: Diagnosis not present

## 2018-04-03 DIAGNOSIS — J301 Allergic rhinitis due to pollen: Secondary | ICD-10-CM | POA: Diagnosis not present

## 2018-04-04 ENCOUNTER — Other Ambulatory Visit: Payer: Self-pay | Admitting: Cardiology

## 2018-04-07 DIAGNOSIS — H00021 Hordeolum internum right upper eyelid: Secondary | ICD-10-CM | POA: Diagnosis not present

## 2018-04-07 DIAGNOSIS — H00022 Hordeolum internum right lower eyelid: Secondary | ICD-10-CM | POA: Diagnosis not present

## 2018-04-08 DIAGNOSIS — H6123 Impacted cerumen, bilateral: Secondary | ICD-10-CM | POA: Diagnosis not present

## 2018-04-09 DIAGNOSIS — J301 Allergic rhinitis due to pollen: Secondary | ICD-10-CM | POA: Diagnosis not present

## 2018-04-09 DIAGNOSIS — J3089 Other allergic rhinitis: Secondary | ICD-10-CM | POA: Diagnosis not present

## 2018-04-12 ENCOUNTER — Other Ambulatory Visit: Payer: Self-pay | Admitting: Family Medicine

## 2018-04-13 DIAGNOSIS — J3089 Other allergic rhinitis: Secondary | ICD-10-CM | POA: Diagnosis not present

## 2018-04-13 DIAGNOSIS — J301 Allergic rhinitis due to pollen: Secondary | ICD-10-CM | POA: Diagnosis not present

## 2018-04-17 ENCOUNTER — Encounter: Payer: Self-pay | Admitting: Family Medicine

## 2018-04-17 ENCOUNTER — Ambulatory Visit (INDEPENDENT_AMBULATORY_CARE_PROVIDER_SITE_OTHER): Payer: PPO | Admitting: Family Medicine

## 2018-04-17 VITALS — BP 108/60 | HR 77 | Temp 98.4°F | Wt 183.0 lb

## 2018-04-17 DIAGNOSIS — J4541 Moderate persistent asthma with (acute) exacerbation: Secondary | ICD-10-CM | POA: Diagnosis not present

## 2018-04-17 DIAGNOSIS — J209 Acute bronchitis, unspecified: Secondary | ICD-10-CM

## 2018-04-17 MED ORDER — METHYLPREDNISOLONE ACETATE 40 MG/ML IJ SUSP
40.0000 mg | Freq: Once | INTRAMUSCULAR | Status: AC
  Administered 2018-04-17: 40 mg via INTRAMUSCULAR

## 2018-04-17 MED ORDER — DOXYCYCLINE HYCLATE 100 MG PO CAPS: 100.0000 mg | ORAL_CAPSULE | Freq: Two times a day (BID) | ORAL | 0 refills | Status: AC

## 2018-04-17 MED ORDER — METHYLPREDNISOLONE ACETATE 80 MG/ML IJ SUSP
80.0000 mg | Freq: Once | INTRAMUSCULAR | Status: AC
  Administered 2018-04-17: 80 mg via INTRAMUSCULAR

## 2018-04-17 MED ORDER — HYDROCODONE-HOMATROPINE 5-1.5 MG/5ML PO SYRP: 5.0000 mL | ORAL_SOLUTION | ORAL | 0 refills | Status: DC | PRN

## 2018-04-17 NOTE — Addendum Note (Signed)
Addended by: Gwenyth Ober R on: 04/17/2018 01:48 PM   Modules accepted: Orders

## 2018-04-17 NOTE — Progress Notes (Signed)
   Subjective:    Patient ID: Brianna Mccarty, female    DOB: November 29, 1940, 77 y.o.   MRN: [id number]  HPI Here for 6 days of chest congestion and coughing up yellow sputum. She has asthma and she has been wheezing a lot as well. Using her nebulizer 3-4 times daily. She had a fever up to 101 degrees a few days ago but this resolved. She had a supply of Doxycycline at home and she has taken 4 doses of this so far, 2 on Wednesday and 2 yesterday. She says this is working and she feels better today.    Review of Systems  Constitutional: Positive for fever.  HENT: Negative.   Eyes: Negative.   Respiratory: Positive for cough, chest tightness and wheezing.   Cardiovascular: Negative.        Objective:   Physical Exam  Constitutional: She appears well-developed and well-nourished.  HENT:  Right Ear: External ear normal.  Left Ear: External ear normal.  Nose: Nose normal.  Mouth/Throat: Oropharynx is clear and moist.  Eyes: Conjunctivae are normal.  Neck: No thyromegaly present.  Pulmonary/Chest: Effort normal. She has no rales.  Scattered wheezes and rhonchi   Lymphadenopathy:    She has no cervical adenopathy.          Assessment & Plan:  Bronchitis on top of asthma. Given a shot of steroids. She will finish out a 10 day course of Doxycycline. Use her nebulizer prn. Use Hydromet for cough.  Alysia Penna, MD

## 2018-04-20 ENCOUNTER — Emergency Department (HOSPITAL_COMMUNITY): Payer: PPO

## 2018-04-20 ENCOUNTER — Emergency Department (HOSPITAL_COMMUNITY)
Admission: EM | Admit: 2018-04-20 | Discharge: 2018-04-21 | Disposition: A | Discharge status: Home / Self Care | Payer: PPO | Attending: Emergency Medicine | Admitting: Emergency Medicine

## 2018-04-20 ENCOUNTER — Other Ambulatory Visit: Payer: Self-pay

## 2018-04-20 ENCOUNTER — Encounter (HOSPITAL_COMMUNITY): Payer: Self-pay | Admitting: Emergency Medicine

## 2018-04-20 DIAGNOSIS — Z79899 Other long term (current) drug therapy: Secondary | ICD-10-CM | POA: Insufficient documentation

## 2018-04-20 DIAGNOSIS — I10 Essential (primary) hypertension: Secondary | ICD-10-CM | POA: Insufficient documentation

## 2018-04-20 DIAGNOSIS — J45909 Unspecified asthma, uncomplicated: Secondary | ICD-10-CM | POA: Insufficient documentation

## 2018-04-20 DIAGNOSIS — D649 Anemia, unspecified: Secondary | ICD-10-CM | POA: Diagnosis not present

## 2018-04-20 DIAGNOSIS — J209 Acute bronchitis, unspecified: Secondary | ICD-10-CM

## 2018-04-20 DIAGNOSIS — R05 Cough: Secondary | ICD-10-CM | POA: Diagnosis not present

## 2018-04-20 LAB — CBC WITH DIFFERENTIAL/PLATELET
Abs Immature Granulocytes: 0.1 10*3/uL (ref 0.0–0.1)
Basophils Absolute: 0 10*3/uL (ref 0.0–0.1)
Basophils Relative: 1 %
Eosinophils Absolute: 0.1 10*3/uL (ref 0.0–0.7)
Eosinophils Relative: 1 %
HCT: 33.6 % — ABNORMAL LOW (ref 36.0–46.0)
Hemoglobin: 11.1 g/dL — ABNORMAL LOW (ref 12.0–15.0)
Immature Granulocytes: 1 %
Lymphocytes Relative: 22 %
Lymphs Abs: 1.1 10*3/uL (ref 0.7–4.0)
MCH: 28.6 pg (ref 26.0–34.0)
MCHC: 33 g/dL (ref 30.0–36.0)
MCV: 86.6 fL (ref 78.0–100.0)
Monocytes Absolute: 0.4 10*3/uL (ref 0.1–1.0)
Monocytes Relative: 8 %
Neutro Abs: 3.5 10*3/uL (ref 1.7–7.7)
Neutrophils Relative %: 67 %
Platelets: 218 10*3/uL (ref 150–400)
RBC: 3.88 MIL/uL (ref 3.87–5.11)
RDW: 13 % (ref 11.5–15.5)
WBC: 5.2 10*3/uL (ref 4.0–10.5)

## 2018-04-20 LAB — COMPREHENSIVE METABOLIC PANEL
ALT: 17 U/L (ref 14–54)
AST: 17 U/L (ref 15–41)
Albumin: 3.7 g/dL (ref 3.5–5.0)
Alkaline Phosphatase: 62 U/L (ref 38–126)
Anion gap: 8 (ref 5–15)
BUN: 9 mg/dL (ref 6–20)
CO2: 25 mmol/L (ref 22–32)
Calcium: 9.3 mg/dL (ref 8.9–10.3)
Chloride: 101 mmol/L (ref 101–111)
Creatinine, Ser: 0.66 mg/dL (ref 0.44–1.00)
GFR calc Af Amer: >60 mL/min (ref >60)
GFR calc non Af Amer: >60 mL/min (ref >60)
Glucose, Bld: 114 mg/dL — ABNORMAL HIGH (ref 65–99)
Potassium: 4.1 mmol/L (ref 3.5–5.1)
Sodium: 134 mmol/L — ABNORMAL LOW (ref 135–145)
Total Bilirubin: 0.5 mg/dL (ref 0.3–1.2)
Total Protein: 6.7 g/dL (ref 6.5–8.1)

## 2018-04-20 MED ORDER — ALBUTEROL SULFATE (2.5 MG/3ML) 0.083% IN NEBU
5.0000 mg | INHALATION_SOLUTION | Freq: Once | RESPIRATORY_TRACT | Status: AC
  Administered 2018-04-20: 5 mg via RESPIRATORY_TRACT
  Filled 2018-04-20: qty 6

## 2018-04-20 NOTE — ED Provider Notes (Signed)
Patient placed in Quick Look pathway, seen and evaluated   Chief Complaint: cough  HPI:    77 year old female presents today with fatigue, cough and increased bowel movements.  She notes for 5 bowel movement today, no diarrhea, no specific abdominal pain.  She notes she is no longer having fever but continues to have cough.  Using nebulizer and rescue inhaler at home.  She was started on doxycycline last Wednesday.  She has a follow-up with her primary care tomorrow.  ROS: cough (one)  Physical Exam:   Gen: No distress  Neuro: Awake and Alert  Skin: Warm    Focused Exam: Abdomen soft nontender lungs slightly decreased bilateral lower bases, no wheezing-no respiratory distress   Initiation of care has begun. The patient has been counseled on the process, plan, and necessity for staying for the completion/evaluation, and the remainder of the medical screening examination   Francee Gentile 04/20/18 2158    Quintella Reichert, MD 04/21/18 1147

## 2018-04-20 NOTE — ED Triage Notes (Signed)
Patient with asthma and bronchitis.  She states that she saw Dr Sharlene Motts last week and was given antibiotic doxycycline and was feeling better.  Now she is having gi upset and fatigue and not having much energy.  She stopped taking the doxycycline yesterday am.  She has an appointment with Dr Sharlene Motts in the am.  She has been using her breathing treatments when needed.

## 2018-04-21 ENCOUNTER — Ambulatory Visit: Payer: PPO | Admitting: Family Medicine

## 2018-04-21 MED ORDER — DEXAMETHASONE SODIUM PHOSPHATE 10 MG/ML IJ SOLN
10.0000 mg | Freq: Once | INTRAMUSCULAR | Status: AC
  Administered 2018-04-21: 10 mg via INTRAVENOUS
  Filled 2018-04-21: qty 1

## 2018-04-21 MED ORDER — ONDANSETRON HCL 4 MG/2ML IJ SOLN
4.0000 mg | Freq: Once | INTRAMUSCULAR | Status: AC
  Administered 2018-04-21: 4 mg via INTRAVENOUS
  Filled 2018-04-21: qty 2

## 2018-04-21 MED ORDER — IPRATROPIUM-ALBUTEROL 0.5-2.5 (3) MG/3ML IN SOLN
3.0000 mL | Freq: Once | RESPIRATORY_TRACT | Status: AC
  Administered 2018-04-21: 3 mL via RESPIRATORY_TRACT
  Filled 2018-04-21: qty 3

## 2018-04-21 MED ORDER — SODIUM CHLORIDE 0.9 % IV BOLUS
1000.0000 mL | Freq: Once | INTRAVENOUS | Status: AC
  Administered 2018-04-21: 1000 mL via INTRAVENOUS

## 2018-04-21 MED ORDER — ONDANSETRON HCL 4 MG PO TABS: 4.0000 mg | ORAL_TABLET | Freq: Four times a day (QID) | ORAL | 0 refills | Status: DC | PRN

## 2018-04-21 NOTE — Discharge Instructions (Addendum)
Continue using your inhaler as needed. [place] it if you are coughing, even if you are not wheezing.  The steroid injection you got today will last about three days. If you start feeling worse after that, talk with your doctor about possibly staying getting a prescription for additional prednisone.

## 2018-04-21 NOTE — ED Provider Notes (Signed)
[facility] EMERGENCY DEPARTMENT Provider Note   CSN: [id number] Arrival date & time: 04/20/18  2119     History   Chief Complaint Chief Complaint  Patient presents with  . Asthma  . Cough    HPI Brianna Mccarty is a 77 y.o. female.  The history is provided by the patient.  She has history of hypertension, hyperlipidemia, recurrent bronchitis and comes in with ongoing problems with cough.  She started getting sick about 1 week ago with fever as high as 101, chills, sweats.  She had a cough which was productive of sputum.  She started herself on doxycycline 5 days ago, but was not improving.  She saw her PCP 3 days ago and received a steroid injection.  She has also been using albuterol inhaler at home with partial relief.  She felt better 2 days ago, but started feeling worse again yesterday.  She has ongoing problems with nonproductive cough and some mild dyspnea.  She is no longer having fever or chills or sweats.  She denies any chest pain.  She is also complaining of nausea.  She denies abdominal pain per se.  There has been no vomiting or diarrhea.  Past Medical History:  Diagnosis Date  . Abnormal EKG    Normal LV function in the past  . Allergic rhinitis   . Asthma   . Bronchitis, mucopurulent recurrent ([place])   . Carotid artery disease ([place])    Doppler, April, 2009, 0-39% bilateral  . Cervical dysplasia 1971  . Chest pain, unspecified   . Diverticulosis of colon   . DJD (degenerative joint disease)   . Ejection fraction    EF 60%, echo, 2009  . GERD (gastroesophageal reflux disease)   . Headache(784.0)   . Hypercholesterolemia   . Hypertension   . Interstitial cystitis    sees urologist  . Lichen sclerosus   . Malignant melanoma ([place])    sees Dr. Nevada Crane in dermatology  . Migraines   . Mitral valve disease    Question mitral valve prolapse in the past, no prolapse by echo 2009  . Murmur 10/20/2015  . Osteoporosis    on fosomax > 5 years,  stopped 11/2015  . Renal calculus    sees urologist  . Thyroid cyst    1 x 1.1 thyroid cyst noted on carotid Doppler, January, 2012  . Venous insufficiency     Patient Active Problem List   Diagnosis Date Noted  . OSA (obstructive sleep apnea) 02/12/2018  . BMI 34.0-34.9,adult (obesity) 02/12/2018  . Hyperglycemia 02/12/2018  . H/O cold sores 11/27/2015  . Murmur 10/20/2015  . Carotid artery disease ([place])   . Thyroid cyst   . Venous (peripheral) insufficiency 06/02/2009  . Asthma 04/27/2008  . Melanoma of skin ([place]) 02/10/2008  . Allergic rhinitis 02/10/2008  . INTERSTITIAL CYSTITIS 02/10/2008  . HYPERCHOLESTEROLEMIA 02/09/2008  . Essential hypertension 02/09/2008  . GERD 02/09/2008  . Osteoporosis 02/09/2008    Past Surgical History:  Procedure Laterality Date  . BREAST BIOPSY Left 11/25/2011   U/S core, benign performed at [facility]  . BREAST CYST ASPIRATION    . BREAST SURGERY  2013   Breast Bx-Benign  . CARDIAC CATHETERIZATION    . CATARACT EXTRACTION, BILATERAL    . cystoscopy and basket stone removal right ureter  02/2006   Dr. Terance Hart  . GYNECOLOGIC CRYOSURGERY  1971  . left total hip replacement  2004   Dr. Gladstone Lighter  . melanoma removed from medial  rleft knee area  2006   Dr. Nevada Crane  . NASAL SEPTUM SURGERY       OB History    Gravida  2   Para  2   Term  2   Preterm      AB      Living  2     SAB      TAB      Ectopic      Multiple      Live Births               Home Medications    Prior to Admission medications   Medication Sig Start Date End Date Taking? Authorizing Provider  acyclovir (ZOVIRAX) 400 MG tablet TAKE 1 TABLET BY MOUTH 3 TIMES A DAY FOR 5DAYS FOR OUTBREAK 09/29/17   Lucretia Kern, DO  albuterol (PROAIR HFA) 108 (90 BASE) MCG/ACT inhaler Inhale 2 puffs into the lungs every 6 (six) hours as needed for wheezing. 06/23/15   Noralee Space, MD  albuterol (PROVENTIL) (2.5 MG/3ML) 0.083% nebulizer solution Take 3 mLs (2.5 mg  total) by nebulization every 6 (six) hours as needed. 06/23/15 05/24/17  Noralee Space, MD  amLODipine (NORVASC) 10 MG tablet Take 1 tablet (10 mg total) by mouth daily. 10/13/17   Lucretia Kern, DO  Ascorbic Acid (VITAMIN C) 500 MG tablet Take 500 mg by mouth daily.      [provider]  aspirin 81 MG tablet Take 81 mg by mouth daily.      [provider]  azelastine (ASTELIN) 0.1 % nasal spray Instill 2 sprays into each nostril two times a day as needed for allergies 11/19/16   [provider]  budesonide-formoterol (SYMBICORT) 160-4.5 MCG/ACT inhaler Inhale 2 puffs 2 (two) times daily into the lungs. 09/10/17   Noralee Space, MD  Calcium Carbonate (CALCIUM 600) 1500 MG TABS Take 1 tablet by mouth daily.      [provider]  Cholecalciferol (VITAMIN D3) 5000 UNITS CAPS Take 5,000 Units by mouth daily.     [provider]  Cinnamon 500 MG TABS Take 1 tablet by mouth daily.     [provider]  clobetasol cream (TEMOVATE) 7.10 % Apply 1 application topically as needed (IRRITATION). 12/08/17   Fontaine, Belinda Block, MD  Coenzyme Q10 (COQ10) 100 MG CAPS Take 1 capsule by mouth daily.      [provider]  desmopressin (DDAVP) 0.2 MG tablet Take 3 tablets by mouth daily at bedtime     [provider]  doxycycline (VIBRAMYCIN) 100 MG capsule Take 1 capsule (100 mg total) by mouth 2 (two) times daily for 10 days. 04/17/18 04/27/18  Laurey Morale, MD  fexofenadine (ALLEGRA) 180 MG tablet Take 180 mg by mouth daily as needed for allergies or rhinitis.    [provider]  furosemide (LASIX) 20 MG tablet TAKE 1 TABLET BY MOUTH EVERY DAY AS NEEDED 04/06/18   Dorothy Spark, MD  hydrALAZINE (APRESOLINE) 50 MG tablet TAKE 1 TABLET BY MOUTH 3 TIMES A DAY AND TAKE EXTRA TABLET IF SBP>160 12/19/17   Dorothy Spark, MD  HYDROcodone-homatropine (HYDROMET) 5-1.5 MG/5ML syrup Take 5 mLs by mouth every 4 (four) hours as needed. 04/17/18    Laurey Morale, MD  hydrOXYzine (ATARAX/VISTARIL) 10 MG tablet Take 10 mg by mouth daily.    [provider]  losartan (COZAAR) 100 MG tablet TAKE 1 TABLET BY MOUTH EVERY DAY *NEEDS APPT*  04/13/18   Lucretia Kern, DO  magnesium oxide (MAG-OX) 400 MG tablet Take 400 mg by mouth daily.      [provider]  Multiple Vitamins-Minerals (OCUVITE ADULT 50+) CAPS Take 1 capsule by mouth daily.    [provider]  olopatadine (PATANOL) 0.1 % ophthalmic solution Place 1 drop into the right eye 2 (two) times daily. 03/07/18   Lucille Passy, MD  omeprazole (PRILOSEC) 40 MG capsule TAKE 1 CAPSULE BY MOUTH EVERY DAY *PT NEEDS APPT 01/05/18   Lucretia Kern, DO  pentosan polysulfate (ELMIRON) 100 MG capsule Take 100 mg by mouth 3 (three) times daily before meals.      [provider]  prazosin (MINIPRESS) 5 MG capsule TAKE ONE CAPSULE BY MOUTH TWICE A DAY 01/20/17   Lucretia Kern, DO  Probiotic Product (PROBIOTIC PO) Take 1 capsule by mouth daily.     [provider]  rosuvastatin (CRESTOR) 5 MG tablet Take 1 tablet (5 mg total) by mouth daily. 01/01/18 04/01/18  Dorothy Spark, MD    Family History Family History  Problem Relation Age of Onset  . Cancer Father        Pancreatic  . Diabetes Mother   . Heart disease Mother   . Cancer Brother        Bile duct  . Diabetes Brother   . Hypertension Brother   . Diabetes Brother   . Heart disease Brother   . Hypertension Brother   . Hypertension Sister   . Hypertension Sister   . Colon cancer Paternal Uncle     Social History Social History   Tobacco Use  . Smoking status: Never Smoker  . Smokeless tobacco: Never Used  Substance Use Topics  . Alcohol use: Yes    Alcohol/week: 0.6 oz    Types: 1 Standard drinks or equivalent per week    Comment: social use  . Drug use: No     Allergies   Cephalexin; Penicillins; Phenobarbital; Sulfa antibiotics; and Tape   Review of Systems Review of Systems  All  other systems reviewed and are negative.    Physical Exam Updated Vital Signs BP (!) 137/57   Pulse 64   Temp 98.5 F (36.9 C) (Oral)   Resp 16   SpO2 98%   Physical Exam  Nursing note and vitals reviewed.  77 year old female, resting comfortably and in no acute distress. Vital signs are normal. Oxygen saturation is 98%, which is normal. Head is normocephalic and atraumatic. PERRLA, EOMI. Oropharynx is clear. Neck is nontender and supple without adenopathy or JVD. Back is nontender and there is no CVA tenderness. Lungs are clear without rales, wheezes, or rhonchi.  Prolonged exhalation phase noted. Chest is nontender. Heart has regular rate and rhythm without murmur. Abdomen is soft, flat, nontender without masses or hepatosplenomegaly and peristalsis is hypoactive. Extremities have no cyanosis or edema, full range of motion is present. Skin is warm and dry without rash. Neurologic: Mental status is normal, cranial nerves are intact, there are no motor or sensory deficits.  ED Treatments / Results  Labs (all labs ordered are listed, but only abnormal results are displayed) Labs Reviewed  CBC WITH DIFFERENTIAL/PLATELET - Abnormal; Notable for the following components:      Result Value   Hemoglobin 11.1 (*)    HCT 33.6 (*)    All other components within normal limits  COMPREHENSIVE METABOLIC PANEL - Abnormal; Notable for the following components:  Sodium 134 (*)    Glucose, Bld 114 (*)    All other components within normal limits   Radiology Dg Chest 2 View  Result Date: 04/20/2018 CLINICAL DATA:  77 year old female with 1 week of abdominal pain, cough, vomiting. EXAM: CHEST - 2 VIEW COMPARISON:  Chest radiograph 04/11/2017 earlier. FINDINGS: Lower lung volumes. Small bilateral pleural effusions. No pneumothorax or pneumoperitoneum. Stable mild cardiomegaly. Other mediastinal contours are within normal limits. Visualized tracheal air column is within normal limits. No  confluent pulmonary opacity. No acute osseous abnormality identified. Negative visible bowel gas pattern. IMPRESSION: 1. Small bilateral pleural effusions. 2. No other acute cardiopulmonary abnormality. 3. Negative visible bowel gas pattern. Electronically Signed   By: Genevie Ann M.D.   On: 04/20/2018 22:33    Procedures Procedures  Medications Ordered in ED Medications  albuterol (PROVENTIL) (2.5 MG/3ML) 0.083% nebulizer solution 5 mg (5 mg Nebulization Given 04/20/18 2158)  ondansetron (ZOFRAN) injection 4 mg (4 mg Intravenous Given 04/21/18 0033)  sodium chloride 0.9 % bolus 1,000 mL (0 mLs Intravenous Stopped 04/21/18 0121)  ipratropium-albuterol (DUONEB) 0.5-2.5 (3) MG/3ML nebulizer solution 3 mL (3 mLs Nebulization Given 04/21/18 0023)  dexamethasone (DECADRON) injection 10 mg (10 mg Intravenous Given 04/21/18 0034)     Initial Impression / Assessment and Plan / ED Course  I have reviewed the triage vital signs and the nursing notes.  Pertinent labs & imaging results that were available during my care of the patient were reviewed by me and considered in my medical decision making (see chart for details).  Nonproductive cough.  She may have started off with a bacterial infection, but is no longer running fever.  Today, WBC is normal and chest x-ray is normal.  I suspect she is mainly having bronchitis secondary to residual inflammatory response.  Nausea is likely gastritis secondary to antibiotic.  No red flags to suggest more serious illness.  She will be given albuterol with ipratropium via nebulizer and will be given a liter of fluids as well as ondansetron for nausea.  She did have slight improvement for 24 hours following steroid injection, I suspect she needs to be on a longer course of steroids.  Will give her a dose of dexamethasone here.  Old records are reviewed confirming office visit for bronchitis 3 days ago.  Final Clinical Impressions(s) / ED Diagnoses   Final diagnoses:  Acute  bronchitis, unspecified organism  Normochromic normocytic anemia    ED Discharge Orders        Ordered    ondansetron (ZOFRAN) 4 MG tablet  Every 6 hours PRN     81/27/51 7001       Delora Fuel, MD 74/94/49 0201

## 2018-04-29 NOTE — Progress Notes (Signed)
HPI:  Using dictation device. Unfortunately this device frequently misinterprets words/phrases.  Follow up bronchitis: -s/p tx with doxy and steroids -xray and wbc ok at ER visit 6/18 -reports doing much better - still occ cough and energy not quite back to normal -also for 1 week some urinary frequency and mild nausea -denies: fevers, SOB, excessive cough, vomiting, diarrhea, bowel issues, bleeding  Regular follow up 2-3 months, AWV is already scheduled for December  ROS: See pertinent positives and negatives per HPI.  Past Medical History:  Diagnosis Date  . Abnormal EKG    Normal LV function in the past  . Allergic rhinitis   . Asthma   . Bronchitis, mucopurulent recurrent ([place])   . Carotid artery disease ([place])    Doppler, April, 2009, 0-39% bilateral  . Cervical dysplasia 1971  . Chest pain, unspecified   . Diverticulosis of colon   . DJD (degenerative joint disease)   . Ejection fraction    EF 60%, echo, 2009  . GERD (gastroesophageal reflux disease)   . Headache(784.0)   . Hypercholesterolemia   . Hypertension   . Interstitial cystitis    sees urologist  . Lichen sclerosus   . Malignant melanoma ([place])    sees Dr. Nevada Crane in dermatology  . Migraines   . Mitral valve disease    Question mitral valve prolapse in the past, no prolapse by echo 2009  . Murmur 10/20/2015  . Osteoporosis    on fosomax > 5 years, stopped 11/2015  . Renal calculus    sees urologist  . Thyroid cyst    1 x 1.1 thyroid cyst noted on carotid Doppler, January, 2012  . Venous insufficiency     Past Surgical History:  Procedure Laterality Date  . BREAST BIOPSY Left 11/25/2011   U/S core, benign performed at [facility]  . BREAST CYST ASPIRATION    . BREAST SURGERY  2013   Breast Bx-Benign  . CARDIAC CATHETERIZATION    . CATARACT EXTRACTION, BILATERAL    . cystoscopy and basket stone removal right ureter  02/2006   Dr. Terance Hart  . GYNECOLOGIC CRYOSURGERY  1971  . left total hip replacement   2004   Dr. Gladstone Lighter  . melanoma removed from medial rleft knee area  2006   Dr. Nevada Crane  . NASAL SEPTUM SURGERY      Family History  Problem Relation Age of Onset  . Cancer Father        Pancreatic  . Diabetes Mother   . Heart disease Mother   . Cancer Brother        Bile duct  . Diabetes Brother   . Hypertension Brother   . Diabetes Brother   . Heart disease Brother   . Hypertension Brother   . Hypertension Sister   . Hypertension Sister   . Colon cancer Paternal Uncle     SOCIAL HX: see hpi   Current Outpatient Medications:  .  albuterol (PROAIR HFA) 108 (90 BASE) MCG/ACT inhaler, Inhale 2 puffs into the lungs every 6 (six) hours as needed for wheezing., Disp: 3 Inhaler, Rfl: 0 .  albuterol (PROVENTIL) (2.5 MG/3ML) 0.083% nebulizer solution, Take 3 mLs (2.5 mg total) by nebulization every 6 (six) hours as needed., Disp: 360 mL, Rfl: 0 .  amLODipine (NORVASC) 10 MG tablet, Take 1 tablet (10 mg total) by mouth daily., Disp: 90 tablet, Rfl: 1 .  Ascorbic Acid (VITAMIN C) 500 MG tablet, Take 500 mg by mouth daily.  , Disp: , Rfl:  .  aspirin 81 MG tablet, Take 81 mg by mouth daily.  , Disp: , Rfl:  .  azelastine (ASTELIN) 0.1 % nasal spray, Instill 2 sprays into each nostril two times a day as needed for allergies, Disp: , Rfl: 5 .  budesonide-formoterol (SYMBICORT) 160-4.5 MCG/ACT inhaler, Inhale 2 puffs 2 (two) times daily into the lungs., Disp: 3 Inhaler, Rfl: 3 .  Calcium Carbonate (CALCIUM 600) 1500 MG TABS, Take 1 tablet by mouth daily.  , Disp: , Rfl:  .  Cholecalciferol (VITAMIN D3) 5000 UNITS CAPS, Take 5,000 Units by mouth daily. , Disp: , Rfl:  .  Cinnamon 500 MG TABS, Take 1 tablet by mouth daily. , Disp: , Rfl:  .  Coenzyme Q10 (COQ10) 100 MG CAPS, Take 1 capsule by mouth daily.  , Disp: , Rfl:  .  desmopressin (DDAVP) 0.2 MG tablet, Take 0.6 mg by mouth at bedtime. , Disp: , Rfl:  .  fexofenadine (ALLEGRA) 180 MG tablet, Take 180 mg by mouth daily as needed for  allergies or rhinitis., Disp: , Rfl:  .  furosemide (LASIX) 20 MG tablet, TAKE 1 TABLET BY MOUTH EVERY DAY AS NEEDED, Disp: 30 tablet, Rfl: 3 .  hydrALAZINE (APRESOLINE) 50 MG tablet, TAKE 1 TABLET BY MOUTH 3 TIMES A DAY AND TAKE EXTRA TABLET IF SBP>160, Disp: 360 tablet, Rfl: 3 .  hydrOXYzine (ATARAX/VISTARIL) 10 MG tablet, Take 10 mg by mouth daily., Disp: , Rfl:  .  losartan (COZAAR) 100 MG tablet, TAKE 1 TABLET BY MOUTH EVERY DAY *NEEDS APPT*, Disp: 30 tablet, Rfl: 5 .  magnesium oxide (MAG-OX) 400 MG tablet, Take 400 mg by mouth daily.  , Disp: , Rfl:  .  Multiple Vitamins-Minerals (OCUVITE ADULT 50+) CAPS, Take 1 capsule by mouth daily., Disp: , Rfl:  .  olopatadine (PATANOL) 0.1 % ophthalmic solution, Place 1 drop into the right eye 2 (two) times daily., Disp: 5 mL, Rfl: 1 .  omeprazole (PRILOSEC) 40 MG capsule, TAKE 1 CAPSULE BY MOUTH EVERY DAY *PT NEEDS APPT, Disp: 90 capsule, Rfl: 1 .  ondansetron (ZOFRAN) 4 MG tablet, Take 1 tablet (4 mg total) by mouth every 6 (six) hours as needed for nausea or vomiting., Disp: 12 tablet, Rfl: 0 .  pentosan polysulfate (ELMIRON) 100 MG capsule, Take 100 mg by mouth 3 (three) times daily before meals.  , Disp: , Rfl:  .  prazosin (MINIPRESS) 5 MG capsule, TAKE ONE CAPSULE BY MOUTH TWICE A DAY, Disp: 180 capsule, Rfl: 1 .  Probiotic Product (PROBIOTIC PO), Take 1 capsule by mouth daily. , Disp: , Rfl:  .  rosuvastatin (CRESTOR) 5 MG tablet, Take 1 tablet (5 mg total) by mouth daily., Disp: 30 tablet, Rfl: 11  EXAM:  Vitals:   04/30/18 0922  BP: 122/78  Pulse: 62  Temp: 98.2 F (36.8 C)    Body mass index is 31.48 kg/m.  GENERAL: vitals reviewed and listed above, alert, oriented, appears well hydrated and in no acute distress  HEENT: atraumatic, conjunttiva clear, no obvious abnormalities on inspection of external nose and ears  NECK: no obvious masses on inspection  LUNGS: clear to auscultation bilaterally, no wheezes, rales or rhonchi,  good air movement  CV: HRRR, no peripheral edema  MS: moves all extremities without noticeable abnormality  PSYCH: pleasant and cooperative, no obvious depression or anxiety  ASSESSMENT AND PLAN:  Discussed the following assessment and plan:  Dysuria  Bronchitis  Nausea  -urine studies -I am glad she if feeling  better, plans to follow up with allergy/asthma specialist -follow up 2-3 months and recheck labs then -Patient advised to return or notify a doctor immediately if symptoms worsen or persist or new concerns arise.  Patient Instructions  BEFORE YOU LEAVE: -Urine dip, micro and culture if abnormal -follow up: Cancel follow-up, scheduled follow-up with Dr. Maudie Mercury and annual wellness with Manuela Schwartz on the same day in 2 to 3 months -we will plan to do labs then.  We have ordered labs or studies at this visit. It can take up to 1-2 weeks for results and processing. IF results require follow up or explanation, we will call you with instructions. Clinically stable results will be released to your [facility] If you have not heard from [place] or cannot find your results in [facility] in 2 weeks please contact our office at [phone].  If you are not yet signed up for [facility], please consider signing up.  Eat 3 healthy meals per day.  Drink water on a regular basis.  Follow-up with your allergist.  You are doing better.  We will plan to recheck labs at your wellness visit and follow-up in a few months.  Seek care promptly if your symptoms worsen, new concerns arise or you are not improving with treatment.           Lucretia Kern, DO

## 2018-04-30 ENCOUNTER — Encounter: Payer: Self-pay | Admitting: Family Medicine

## 2018-04-30 ENCOUNTER — Ambulatory Visit (INDEPENDENT_AMBULATORY_CARE_PROVIDER_SITE_OTHER): Payer: PPO | Admitting: Family Medicine

## 2018-04-30 VITALS — BP 122/78 | HR 62 | Temp 98.2°F | Ht 63.0 in | Wt 177.7 lb

## 2018-04-30 DIAGNOSIS — J40 Bronchitis, not specified as acute or chronic: Secondary | ICD-10-CM | POA: Diagnosis not present

## 2018-04-30 DIAGNOSIS — R300 Dysuria: Secondary | ICD-10-CM | POA: Diagnosis not present

## 2018-04-30 DIAGNOSIS — R110 Nausea: Secondary | ICD-10-CM | POA: Diagnosis not present

## 2018-04-30 LAB — POCT URINALYSIS DIPSTICK
Bilirubin, UA: NEGATIVE
Glucose, UA: NEGATIVE
KETONES UA: NEGATIVE
LEUKOCYTES UA: NEGATIVE
NITRITE UA: NEGATIVE
PH UA: 7.5 (ref 5.0–8.0)
PROTEIN UA: NEGATIVE
Spec Grav, UA: 1.02 (ref 1.010–1.025)
UROBILINOGEN UA: 0.2 U/dL

## 2018-04-30 LAB — URINALYSIS, MICROSCOPIC ONLY: WBC, UA: NONE SEEN (ref 0–?)

## 2018-04-30 NOTE — Patient Instructions (Addendum)
BEFORE YOU LEAVE: -Urine dip, micro and culture if abnormal -follow up: Cancel follow-up, scheduled follow-up with Dr. Maudie Mercury in 2 to 3 months -we will plan to do labs then.  We have ordered labs or studies at this visit. It can take up to 1-2 weeks for results and processing. IF results require follow up or explanation, we will call you with instructions. Clinically stable results will be released to your [facility]. If you have not heard from [place] or cannot find your results in [facility] in 2 weeks please contact our office at [phone].  If you are not yet signed up for [facility], please consider signing up.  Eat 3 healthy meals per day.  Drink water on a regular basis.  Follow-up with your allergist.  You are doing better.  We will plan to recheck labs at your wellness visit and follow-up in a few months.  Seek care promptly if your symptoms worsen, new concerns arise or you are not improving with treatment.

## 2018-05-01 DIAGNOSIS — J3089 Other allergic rhinitis: Secondary | ICD-10-CM | POA: Diagnosis not present

## 2018-05-01 DIAGNOSIS — J301 Allergic rhinitis due to pollen: Secondary | ICD-10-CM | POA: Diagnosis not present

## 2018-05-01 LAB — URINE CULTURE
MICRO NUMBER:: 90769400
Result:: NO GROWTH
SPECIMEN QUALITY:: ADEQUATE

## 2018-05-04 ENCOUNTER — Other Ambulatory Visit: Payer: Self-pay

## 2018-05-04 NOTE — Patient Outreach (Signed)
[facility]) Care Management  05/04/2018  Brianna Mccarty [birth year] [id number]   TELEPHONE SCREENING Referral date: 04/27/18 Referral source: [facility] utilization management Referral reason: medication assistance Insurance: [employer] Attempt #1  Telephone call to patient regarding utilization management referral. Unable to reach. HIPAA compliant voice message left with call back phone number.   PLAN: RNCM will attempt 2nd telephone call to patient within 4 business days.  RNCM will send outreach letter.   Quinn Plowman RN,BSN,CCM [facility] Telephonic  [phone]

## 2018-05-05 ENCOUNTER — Telehealth: Payer: Self-pay | Admitting: Family Medicine

## 2018-05-05 NOTE — Telephone Encounter (Signed)
Copied from [place] [phone]. Topic: Quick Communication - Lab Results >> May 04, 2018  2:13 PM Agnes Lawrence, CMA wrote: Called patient to inform them of lab results. When patient returns call, triage nurse may disclose results.

## 2018-05-05 NOTE — Telephone Encounter (Signed)
Charted in result notes. 

## 2018-05-08 ENCOUNTER — Other Ambulatory Visit: Payer: Self-pay

## 2018-05-08 DIAGNOSIS — N3010 Interstitial cystitis (chronic) without hematuria: Secondary | ICD-10-CM | POA: Diagnosis not present

## 2018-05-08 NOTE — Patient Outreach (Signed)
[facility]) Care Management  05/08/2018  BONNA STEURY 1941-01-22 [id number]   TELEPHONE SCREENING Referral date: 04/27/18 Referral source: [facility] utilization management Referral reason: medication assistance Insurance: [employer] team advantage   Telephone call to patient regarding utilization management referral.  Attempted call to listed home and mobile number. Unable to reach. HIPAA compliant voice messages left with call back phone number.   PLAN: RNCM will attempt 3rd telephone call to patient within 4 business days.   Quinn Plowman RN,BSN,CCM [facility] Telephonic  [phone]

## 2018-05-13 ENCOUNTER — Other Ambulatory Visit: Payer: Self-pay

## 2018-05-13 NOTE — Patient Outreach (Signed)
[facility]) Care Management  05/13/2018  Brianna Mccarty 1941/08/21 [id number]   TELEPHONE SCREENING Referral date:04/27/18 Referral source:[facility] utilization management Referral reason:medication assistance Insurance:[employer] team advantage Attempt #3  Return voice mail message received from patient.  Telephone call to patient regarding utilization management referral. Unable to reach patient. HIPAA compliant voice message left with call back phone number.   PLAN; If no return call will proceed with case closure.   Quinn Plowman RN,BSN,CCM [facility] Telephonic  [phone]

## 2018-05-14 ENCOUNTER — Ambulatory Visit: Payer: PPO | Admitting: Family Medicine

## 2018-05-14 ENCOUNTER — Other Ambulatory Visit: Payer: Self-pay | Admitting: Family Medicine

## 2018-05-25 ENCOUNTER — Other Ambulatory Visit: Payer: PPO | Admitting: *Deleted

## 2018-05-25 ENCOUNTER — Telehealth: Payer: Self-pay | Admitting: *Deleted

## 2018-05-25 DIAGNOSIS — I779 Disorder of arteries and arterioles, unspecified: Secondary | ICD-10-CM

## 2018-05-25 DIAGNOSIS — Z789 Other specified health status: Secondary | ICD-10-CM

## 2018-05-25 DIAGNOSIS — E782 Mixed hyperlipidemia: Secondary | ICD-10-CM | POA: Diagnosis not present

## 2018-05-25 DIAGNOSIS — E7800 Pure hypercholesterolemia, unspecified: Secondary | ICD-10-CM

## 2018-05-25 DIAGNOSIS — I739 Peripheral vascular disease, unspecified: Secondary | ICD-10-CM

## 2018-05-25 LAB — LIPID PANEL
Chol/HDL Ratio: 2.4 ratio (ref 0.0–4.4)
Cholesterol, Total: 206 mg/dL — ABNORMAL HIGH (ref 100–199)
HDL: 86 mg/dL (ref >39)
LDL Calculated: 114 mg/dL — ABNORMAL HIGH (ref 0–99)
Triglycerides: 32 mg/dL (ref 0–149)
VLDL Cholesterol Cal: 6 mg/dL (ref 5–40)

## 2018-05-25 LAB — HEPATIC FUNCTION PANEL
ALT: 11 IU/L (ref 0–32)
AST: 14 IU/L (ref 0–40)
Albumin: 4.1 g/dL (ref 3.5–4.8)
Alkaline Phosphatase: 57 IU/L (ref 39–117)
Bilirubin Total: 0.2 mg/dL (ref 0.0–1.2)
Bilirubin, Direct: 0.08 mg/dL (ref 0.00–0.40)
Total Protein: 6.3 g/dL (ref 6.0–8.5)

## 2018-05-25 MED ORDER — PRAVASTATIN SODIUM 20 MG PO TABS: 20.0000 mg | ORAL_TABLET | Freq: Every evening | ORAL | 3 refills | Status: DC

## 2018-05-25 MED ORDER — CO Q-10 200 MG PO CAPS: 200.0000 mg | ORAL_CAPSULE | Freq: Every day | ORAL | 0 refills | Status: DC

## 2018-05-25 NOTE — Telephone Encounter (Signed)
-----   Message from Sueanne Margarita, MD sent at 05/25/2018  6:15 PM EDT ----- Stop crestor and start pravastatin 20mg  daily and repeat FLP and ALT in 6 weeks.  Have her take Coenzyme Q10 150-200mg  once daily

## 2018-05-25 NOTE — Telephone Encounter (Signed)
Notes recorded by Sueanne Margarita, MD on 05/25/2018 at 6:15 PM EDT Stop crestor and start pravastatin 20mg  daily and repeat FLP and ALT in 6 weeks. Have her take Coenzyme Q10 150-200mg  once daily ------  Notes recorded by Nuala Alpha, LPN on 2/70/7867 at 5:44 PM EDT Spoke with the pt and endorsed lab results and recommendations per Dr Radford Pax (covering for Dr Meda Coffee).  Pt states that she is having a terrible time with taking Crestor, for this is causing her lower extremity muscle/joint aches.  Pt would like for Dr Radford Pax or Dr Meda Coffee to advise on a different regimen, due to this intolerance. Informed the pt that I will route this message to both Dr Radford Pax and our Pharmacist in our Lipid clinic, for further review and recommendation, of noted intolerance. Informed the pt that I will follow-up with her shortly thereafter, once further advise provided.  Pt verbalized understanding and agrees with this plan. ------  Notes recorded by Sueanne Margarita, MD on 05/25/2018 at 5:10 PM EDT LDL not at goal (goal < 70 due to carotid disease). Please increase crestor to 10mg  daily and repeat FLP and ALt in 6 weeks    Spoke with the pt and informed her that per Dr Radford Pax (covering for Dr Meda Coffee), she reviewed her issues with rosuvastatin, and now recommends that she stop crestor, and start pravastatin 20 mg po daily, and repeat lipids and ALT in 6 weeks.  Also advised the pt that per Dr Radford Pax, she should increase her CoQ10 to 200 mg po daily OTC.  Scheduled the pt her repeat labs for 07/07/18, to check lipids and ALT.  Pt aware to come fasting to this lab appt.  Also confirmed the pharmacy of choice with the pt.  Informed the pt that I will only send a month supply of this med, to make sure she tolerates this appropriately. Pt has CoQ10 tabs on hand, and purchases them OTC.  She will take 200 mg po daily.  Pt verbalized understanding and agrees with this plan.

## 2018-05-26 DIAGNOSIS — J301 Allergic rhinitis due to pollen: Secondary | ICD-10-CM | POA: Diagnosis not present

## 2018-05-26 DIAGNOSIS — L820 Inflamed seborrheic keratosis: Secondary | ICD-10-CM | POA: Diagnosis not present

## 2018-05-26 DIAGNOSIS — X32XXXD Exposure to sunlight, subsequent encounter: Secondary | ICD-10-CM | POA: Diagnosis not present

## 2018-05-26 DIAGNOSIS — L570 Actinic keratosis: Secondary | ICD-10-CM | POA: Diagnosis not present

## 2018-05-26 DIAGNOSIS — J3089 Other allergic rhinitis: Secondary | ICD-10-CM | POA: Diagnosis not present

## 2018-05-27 ENCOUNTER — Other Ambulatory Visit: Payer: Self-pay

## 2018-05-27 NOTE — Patient Outreach (Signed)
[facility]) Care Management  05/27/2018  Brianna Mccarty 1940-12-29 [id number]  No response from patient after 3 telephone calls and outreach letter attempt.   PLAN; RNCm will close patient due to being unable to reach.  RNCm will send closure notification to patients primary MD.    Quinn Plowman RN,BSN,CCM [facility] Telephonic  [phone]

## 2018-05-28 DIAGNOSIS — J3089 Other allergic rhinitis: Secondary | ICD-10-CM | POA: Diagnosis not present

## 2018-05-28 DIAGNOSIS — J301 Allergic rhinitis due to pollen: Secondary | ICD-10-CM | POA: Diagnosis not present

## 2018-06-01 ENCOUNTER — Telehealth: Payer: Self-pay | Admitting: Cardiology

## 2018-06-01 NOTE — Telephone Encounter (Signed)
Pt was calling to confirm her dose of pravastatin and when to recheck her lipids/ALTs.  Endorsed to the pt that she should be taking pravastatin 20 mg po daily, and should come in for fasting lipids on 9/3, as previously scheduled.  Endorsed to the pt that if she has any issues at all with tolerating pravastatin, she is to call the office back to inform Dr Meda Coffee and myself. Pt verbalized understanding and agrees with this plan.

## 2018-06-01 NOTE — Telephone Encounter (Signed)
Patient calling about some medication issue. Name of medical Pravastatin 5mg . Patient would like to know if this medication is going to give her there results that she need.

## 2018-06-02 DIAGNOSIS — J3089 Other allergic rhinitis: Secondary | ICD-10-CM | POA: Diagnosis not present

## 2018-06-02 DIAGNOSIS — J301 Allergic rhinitis due to pollen: Secondary | ICD-10-CM | POA: Diagnosis not present

## 2018-06-12 DIAGNOSIS — J3089 Other allergic rhinitis: Secondary | ICD-10-CM | POA: Diagnosis not present

## 2018-06-12 DIAGNOSIS — J301 Allergic rhinitis due to pollen: Secondary | ICD-10-CM | POA: Diagnosis not present

## 2018-06-15 ENCOUNTER — Encounter (INDEPENDENT_AMBULATORY_CARE_PROVIDER_SITE_OTHER): Payer: PPO | Admitting: Ophthalmology

## 2018-06-15 DIAGNOSIS — J301 Allergic rhinitis due to pollen: Secondary | ICD-10-CM | POA: Diagnosis not present

## 2018-06-15 DIAGNOSIS — H35033 Hypertensive retinopathy, bilateral: Secondary | ICD-10-CM

## 2018-06-15 DIAGNOSIS — H353111 Nonexudative age-related macular degeneration, right eye, early dry stage: Secondary | ICD-10-CM

## 2018-06-15 DIAGNOSIS — I10 Essential (primary) hypertension: Secondary | ICD-10-CM | POA: Diagnosis not present

## 2018-06-15 DIAGNOSIS — D3131 Benign neoplasm of right choroid: Secondary | ICD-10-CM | POA: Diagnosis not present

## 2018-06-15 DIAGNOSIS — H35341 Macular cyst, hole, or pseudohole, right eye: Secondary | ICD-10-CM

## 2018-06-15 DIAGNOSIS — J3089 Other allergic rhinitis: Secondary | ICD-10-CM | POA: Diagnosis not present

## 2018-06-15 DIAGNOSIS — H353122 Nonexudative age-related macular degeneration, left eye, intermediate dry stage: Secondary | ICD-10-CM | POA: Diagnosis not present

## 2018-06-15 DIAGNOSIS — D3132 Benign neoplasm of left choroid: Secondary | ICD-10-CM | POA: Diagnosis not present

## 2018-06-15 DIAGNOSIS — H43813 Vitreous degeneration, bilateral: Secondary | ICD-10-CM | POA: Diagnosis not present

## 2018-06-15 DIAGNOSIS — H33303 Unspecified retinal break, bilateral: Secondary | ICD-10-CM

## 2018-06-17 ENCOUNTER — Other Ambulatory Visit: Payer: Self-pay | Admitting: Family Medicine

## 2018-06-19 ENCOUNTER — Encounter: Payer: Self-pay | Admitting: Family Medicine

## 2018-06-19 DIAGNOSIS — H40013 Open angle with borderline findings, low risk, bilateral: Secondary | ICD-10-CM | POA: Diagnosis not present

## 2018-06-19 DIAGNOSIS — H26491 Other secondary cataract, right eye: Secondary | ICD-10-CM | POA: Diagnosis not present

## 2018-06-19 DIAGNOSIS — Z961 Presence of intraocular lens: Secondary | ICD-10-CM | POA: Diagnosis not present

## 2018-06-19 DIAGNOSIS — H35341 Macular cyst, hole, or pseudohole, right eye: Secondary | ICD-10-CM | POA: Diagnosis not present

## 2018-06-19 LAB — HM DIABETES EYE EXAM

## 2018-06-23 ENCOUNTER — Ambulatory Visit (HOSPITAL_COMMUNITY): Payer: PPO | Attending: Cardiovascular Disease

## 2018-06-23 ENCOUNTER — Other Ambulatory Visit: Payer: Self-pay

## 2018-06-23 DIAGNOSIS — I080 Rheumatic disorders of both mitral and aortic valves: Secondary | ICD-10-CM | POA: Insufficient documentation

## 2018-06-23 DIAGNOSIS — J3089 Other allergic rhinitis: Secondary | ICD-10-CM | POA: Diagnosis not present

## 2018-06-23 DIAGNOSIS — I350 Nonrheumatic aortic (valve) stenosis: Secondary | ICD-10-CM | POA: Diagnosis present

## 2018-06-23 DIAGNOSIS — I119 Hypertensive heart disease without heart failure: Secondary | ICD-10-CM | POA: Insufficient documentation

## 2018-06-23 DIAGNOSIS — E785 Hyperlipidemia, unspecified: Secondary | ICD-10-CM | POA: Diagnosis not present

## 2018-06-23 DIAGNOSIS — J301 Allergic rhinitis due to pollen: Secondary | ICD-10-CM | POA: Diagnosis not present

## 2018-06-24 DIAGNOSIS — J3089 Other allergic rhinitis: Secondary | ICD-10-CM | POA: Diagnosis not present

## 2018-06-24 DIAGNOSIS — J301 Allergic rhinitis due to pollen: Secondary | ICD-10-CM | POA: Diagnosis not present

## 2018-06-27 ENCOUNTER — Other Ambulatory Visit: Payer: Self-pay | Admitting: Family Medicine

## 2018-07-01 DIAGNOSIS — J3089 Other allergic rhinitis: Secondary | ICD-10-CM | POA: Diagnosis not present

## 2018-07-01 DIAGNOSIS — J301 Allergic rhinitis due to pollen: Secondary | ICD-10-CM | POA: Diagnosis not present

## 2018-07-07 ENCOUNTER — Other Ambulatory Visit: Payer: PPO | Admitting: *Deleted

## 2018-07-07 DIAGNOSIS — I779 Disorder of arteries and arterioles, unspecified: Secondary | ICD-10-CM | POA: Diagnosis not present

## 2018-07-07 DIAGNOSIS — Z789 Other specified health status: Secondary | ICD-10-CM | POA: Diagnosis not present

## 2018-07-07 DIAGNOSIS — E7800 Pure hypercholesterolemia, unspecified: Secondary | ICD-10-CM | POA: Diagnosis not present

## 2018-07-07 DIAGNOSIS — J301 Allergic rhinitis due to pollen: Secondary | ICD-10-CM | POA: Diagnosis not present

## 2018-07-07 DIAGNOSIS — H00024 Hordeolum internum left upper eyelid: Secondary | ICD-10-CM | POA: Diagnosis not present

## 2018-07-07 DIAGNOSIS — I739 Peripheral vascular disease, unspecified: Secondary | ICD-10-CM

## 2018-07-07 DIAGNOSIS — J3089 Other allergic rhinitis: Secondary | ICD-10-CM | POA: Diagnosis not present

## 2018-07-07 LAB — LIPID PANEL
Chol/HDL Ratio: 2.6 ratio (ref 0.0–4.4)
Cholesterol, Total: 223 mg/dL — ABNORMAL HIGH (ref 100–199)
HDL: 85 mg/dL (ref >39)
LDL CALC: 132 mg/dL — AB (ref 0–99)
Triglycerides: 32 mg/dL (ref 0–149)
VLDL Cholesterol Cal: 6 mg/dL (ref 5–40)

## 2018-07-07 LAB — ALT: ALT: 12 IU/L (ref 0–32)

## 2018-07-15 DIAGNOSIS — J3089 Other allergic rhinitis: Secondary | ICD-10-CM | POA: Diagnosis not present

## 2018-07-15 DIAGNOSIS — J301 Allergic rhinitis due to pollen: Secondary | ICD-10-CM | POA: Diagnosis not present

## 2018-07-22 DIAGNOSIS — J301 Allergic rhinitis due to pollen: Secondary | ICD-10-CM | POA: Diagnosis not present

## 2018-07-22 DIAGNOSIS — J3089 Other allergic rhinitis: Secondary | ICD-10-CM | POA: Diagnosis not present

## 2018-07-22 DIAGNOSIS — J4541 Moderate persistent asthma with (acute) exacerbation: Secondary | ICD-10-CM | POA: Diagnosis not present

## 2018-07-22 DIAGNOSIS — J4540 Moderate persistent asthma, uncomplicated: Secondary | ICD-10-CM | POA: Diagnosis not present

## 2018-07-22 DIAGNOSIS — K219 Gastro-esophageal reflux disease without esophagitis: Secondary | ICD-10-CM | POA: Diagnosis not present

## 2018-07-30 DIAGNOSIS — J3089 Other allergic rhinitis: Secondary | ICD-10-CM | POA: Diagnosis not present

## 2018-07-30 DIAGNOSIS — J301 Allergic rhinitis due to pollen: Secondary | ICD-10-CM | POA: Diagnosis not present

## 2018-08-04 ENCOUNTER — Ambulatory Visit (INDEPENDENT_AMBULATORY_CARE_PROVIDER_SITE_OTHER): Payer: PPO | Admitting: Family Medicine

## 2018-08-04 ENCOUNTER — Encounter: Payer: Self-pay | Admitting: Family Medicine

## 2018-08-04 VITALS — BP 120/60 | HR 70 | Temp 98.2°F | Ht 63.0 in | Wt 183.9 lb

## 2018-08-04 DIAGNOSIS — I739 Peripheral vascular disease, unspecified: Secondary | ICD-10-CM | POA: Diagnosis not present

## 2018-08-04 DIAGNOSIS — J301 Allergic rhinitis due to pollen: Secondary | ICD-10-CM | POA: Diagnosis not present

## 2018-08-04 DIAGNOSIS — J3089 Other allergic rhinitis: Secondary | ICD-10-CM | POA: Diagnosis not present

## 2018-08-04 DIAGNOSIS — I779 Disorder of arteries and arterioles, unspecified: Secondary | ICD-10-CM

## 2018-08-04 DIAGNOSIS — E7800 Pure hypercholesterolemia, unspecified: Secondary | ICD-10-CM

## 2018-08-04 DIAGNOSIS — Z6832 Body mass index (BMI) 32.0-32.9, adult: Secondary | ICD-10-CM | POA: Diagnosis not present

## 2018-08-04 DIAGNOSIS — E669 Obesity, unspecified: Secondary | ICD-10-CM

## 2018-08-04 DIAGNOSIS — R739 Hyperglycemia, unspecified: Secondary | ICD-10-CM

## 2018-08-04 DIAGNOSIS — I10 Essential (primary) hypertension: Secondary | ICD-10-CM | POA: Diagnosis not present

## 2018-08-04 DIAGNOSIS — Z23 Encounter for immunization: Secondary | ICD-10-CM | POA: Diagnosis not present

## 2018-08-04 NOTE — Progress Notes (Signed)
HPI:  Using dictation device. Unfortunately this device frequently misinterprets words/phrases.  Brianna Mccarty is a pleasant 77 y.o. here for follow up. Chronic medical problems summarized below were reviewed for changes and stability and were updated as needed below. These issues and their treatment remain stable for the most part. . Denies CP, SOB, DOE, treatment intolerance or new symptoms. Due for flu shot, bmp, cbc, hgbac, lipids AWV 10/22/17  Obesity/hyperglycemia: -see hpi, has cut back on sweets  OSA: -seeing cardiologist for managment -mod-severe on study with cardiologist in 2018, CPAP titration pending  Anxiety and Depression: -much better as daughter is now doing better -resolved  HTN/Carotid Art Dz/HLD/venous insufficiency: -sees cardiologist -meds: norvasc, hydralazine, hctz, losartan, prazosin, asa, pravastatin, considering pcsk9 per cardiology lab notes  Asthma/Allergies: -meds: alb prn, azelastine nasal, Symbicort, allegra  GERD: -meds: prilosec 40mg   Osteoporosis: -on fosamax in the past, nowon holiday per her preference since 2017  Melanoma: -sees dermatologist for management  IC: -sees urologist for management -meds: desmopressin, elmiron ROS: See pertinent positives and negatives per HPI.    ROS: See pertinent positives and negatives per HPI.  Past Medical History:  Diagnosis Date  . Abnormal EKG    Normal LV function in the past  . Allergic rhinitis   . Asthma   . Bronchitis, mucopurulent recurrent ([place])   . Carotid artery disease ([place])    Doppler, April, 2009, 0-39% bilateral  . Cervical dysplasia 1971  . Chest pain, unspecified   . Diverticulosis of colon   . DJD (degenerative joint disease)   . Ejection fraction    EF 60%, echo, 2009  . GERD (gastroesophageal reflux disease)   . Headache(784.0)   . Hypercholesterolemia   . Hypertension   . Interstitial cystitis    sees urologist  . Lichen sclerosus   .  Malignant melanoma ([place])    sees Dr. [place] Crane in dermatology  . Migraines   . Mitral valve disease    Question mitral valve prolapse in the past, no prolapse by echo 2009  . Murmur 10/20/2015  . Osteoporosis    on fosomax > 5 years, stopped 11/2015  . Renal calculus    sees urologist  . Thyroid cyst    1 x 1.1 thyroid cyst noted on carotid Doppler, January, 2012  . Venous insufficiency     Past Surgical History:  Procedure Laterality Date  . BREAST BIOPSY Left 11/25/2011   U/S core, benign performed at [facility]  . BREAST CYST ASPIRATION    . BREAST SURGERY  2013   Breast Bx-Benign  . CARDIAC CATHETERIZATION    . CATARACT EXTRACTION, BILATERAL    . cystoscopy and basket stone removal right ureter  02/2006   Dr. Terance Hart  . GYNECOLOGIC CRYOSURGERY  1971  . left total hip replacement  2004   Dr. Gladstone Lighter  . melanoma removed from medial rleft knee area  2006   Dr. Nevada Crane  . NASAL SEPTUM SURGERY      Family History  Problem Relation Age of Onset  . Cancer Father        Pancreatic  . Diabetes Mother   . Heart disease Mother   . Cancer Brother        Bile duct  . Diabetes Brother   . Hypertension Brother   . Diabetes Brother   . Heart disease Brother   . Hypertension Brother   . Hypertension Sister   . Hypertension Sister   . Colon cancer Paternal Uncle  SOCIAL HX: see hpi   Current Outpatient Medications:  .  albuterol (PROAIR HFA) 108 (90 BASE) MCG/ACT inhaler, Inhale 2 puffs into the lungs every 6 (six) hours as needed for wheezing., Disp: 3 Inhaler, Rfl: 0 .  albuterol (PROVENTIL) (2.5 MG/3ML) 0.083% nebulizer solution, Take 3 mLs (2.5 mg total) by nebulization every 6 (six) hours as needed., Disp: 360 mL, Rfl: 0 .  amLODipine (NORVASC) 10 MG tablet, TAKE 1 TABLET BY MOUTH EVERY DAY, Disp: 90 tablet, Rfl: 1 .  Ascorbic Acid (VITAMIN C) 500 MG tablet, Take 500 mg by mouth daily.  , Disp: , Rfl:  .  aspirin 81 MG tablet, Take 81 mg by mouth daily.  , Disp: , Rfl:  .   azelastine (ASTELIN) 0.1 % nasal spray, Instill 2 sprays into each nostril two times a day as needed for allergies, Disp: , Rfl: 5 .  budesonide-formoterol (SYMBICORT) 160-4.5 MCG/ACT inhaler, Inhale 2 puffs 2 (two) times daily into the lungs., Disp: 3 Inhaler, Rfl: 3 .  Calcium Carbonate (CALCIUM 600) 1500 MG TABS, Take 1 tablet by mouth daily.  , Disp: , Rfl:  .  Cholecalciferol (VITAMIN D3) 5000 UNITS CAPS, Take 5,000 Units by mouth daily. , Disp: , Rfl:  .  Cinnamon 500 MG TABS, Take 1 tablet by mouth daily. , Disp: , Rfl:  .  Coenzyme Q10 (CO Q-10) 200 MG CAPS, Take 200 mg by mouth daily., Disp: 30 each, Rfl: 0 .  desmopressin (DDAVP) 0.2 MG tablet, Take 0.6 mg by mouth at bedtime. , Disp: , Rfl:  .  fexofenadine (ALLEGRA) 180 MG tablet, Take 180 mg by mouth daily as needed for allergies or rhinitis., Disp: , Rfl:  .  furosemide (LASIX) 20 MG tablet, TAKE 1 TABLET BY MOUTH EVERY DAY AS NEEDED, Disp: 30 tablet, Rfl: 3 .  hydrALAZINE (APRESOLINE) 50 MG tablet, TAKE 1 TABLET BY MOUTH 3 TIMES A DAY AND TAKE EXTRA TABLET IF SBP>160, Disp: 360 tablet, Rfl: 3 .  hydrOXYzine (ATARAX/VISTARIL) 10 MG tablet, Take 10 mg by mouth daily., Disp: , Rfl:  .  losartan (COZAAR) 100 MG tablet, TAKE 1 TABLET BY MOUTH EVERY DAY *NEEDS APPT*, Disp: 30 tablet, Rfl: 5 .  magnesium oxide (MAG-OX) 400 MG tablet, Take 400 mg by mouth daily.  , Disp: , Rfl:  .  Multiple Vitamins-Minerals (OCUVITE ADULT 50+) CAPS, Take 1 capsule by mouth daily., Disp: , Rfl:  .  olopatadine (PATANOL) 0.1 % ophthalmic solution, Place 1 drop into the right eye 2 (two) times daily., Disp: 5 mL, Rfl: 1 .  omeprazole (PRILOSEC) 40 MG capsule, TAKE 1 CAPSULE BY MOUTH EVERY DAY *PT NEEDS APPT, Disp: 90 capsule, Rfl: 1 .  pentosan polysulfate (ELMIRON) 100 MG capsule, Take 100 mg by mouth 3 (three) times daily before meals.  , Disp: , Rfl:  .  pravastatin (PRAVACHOL) 20 MG tablet, Take 1 tablet (20 mg total) by mouth every evening., Disp: 90  tablet, Rfl: 3 .  prazosin (MINIPRESS) 5 MG capsule, TAKE 1 CAPSULE BY MOUTH TWICE A DAY, Disp: 180 capsule, Rfl: 1 .  Probiotic Product (PROBIOTIC PO), Take 1 capsule by mouth daily. , Disp: , Rfl:   EXAM:  Vitals:   08/04/18 1307  BP: 120/60  Pulse: 70  Temp: 98.2 F (36.8 C)    Body mass index is 32.58 kg/m.  GENERAL: vitals reviewed and listed above, alert, oriented, appears well hydrated and in no acute distress  HEENT: atraumatic, conjunttiva clear, no  obvious abnormalities on inspection of external nose and ears  NECK: no obvious masses on inspection  LUNGS: clear to auscultation bilaterally, no wheezes, rales or rhonchi, good air movement  CV: HRRR, no peripheral edema  MS: moves all extremities without noticeable abnormality  PSYCH: pleasant and cooperative, no obvious depression or anxiety  ASSESSMENT AND PLAN:  Discussed the following assessment and plan:  Hyperglycemia  Essential hypertension  HYPERCHOLESTEROLEMIA  BMI 32.0-32.9,adult  Bilateral carotid artery disease, unspecified type ([place])  -labs per orders, but she prefers to return fasting next week -flu shot today -lifestyle recs - she plans to get back on track -follow up AWV in December and f.u with me in 3-4 months, as needed in interim   Patient Instructions  BEFORE YOU LEAVE: -flu shot -schedule lab visit in next 1-2 weeks -follow up: AWV with Manuela Schwartz in December (after the 19th) Follow up Dr. Maudie Mercury in 3-4 months  We have ordered labs or studies at this visit. It can take up to 1-2 weeks for results and processing. IF results require follow up or explanation, we will call you with instructions. Clinically stable results will be released to your [facility]. If you have not heard from [place] or cannot find your results in [facility] in 2 weeks please contact our office at [phone].  If you are not yet signed up for [facility], please consider signing up.   We recommend the following healthy  lifestyle for LIFE: 1) Small portions. But, make sure to get regular (at least 3 per day), healthy meals and small healthy snacks if needed.  2) Eat a healthy clean diet.   TRY TO EAT: -at least 5-7 servings of low sugar, colorful, and nutrient rich vegetables per day (not corn, potatoes or bananas.) -berries are the best choice if you wish to eat fruit (only eat small amounts if trying to reduce weight)  -lean meets (fish, white meat of chicken or [place]) -vegan proteins for some meals - beans or tofu, whole grains, nuts and seeds -Replace bad fats with good fats - good fats include: fish, nuts and seeds, canola oil, olive oil -small amounts of low fat or non fat dairy -small amounts of100 % whole grains - check the lables -drink plenty of water  AVOID: -SUGAR, sweets, anything with added sugar, corn syrup or sweeteners - must read labels as even foods advertised as "healthy" often are loaded with sugar -if you must have a sweetener, small amounts of stevia may be best -sweetened beverages and artificially sweetened beverages -simple starches (rice, bread, potatoes, pasta, chips, etc - small amounts of 100% whole grains are ok) -red meat, pork, butter -fried foods, fast food, processed food, excessive dairy, eggs and coconut.  3)Get at least 150 minutes of sweaty aerobic exercise per week.  4)Reduce stress - consider counseling, meditation and relaxation to balance other aspects of your life.          Lucretia Kern, DO

## 2018-08-04 NOTE — Patient Instructions (Signed)
BEFORE YOU LEAVE: -flu shot -schedule lab visit in next 1-2 weeks -follow up: AWV with Manuela Schwartz in December (after the 19th) Follow up Dr. Maudie Mercury in 3-4 months  We have ordered labs or studies at this visit. It can take up to 1-2 weeks for results and processing. IF results require follow up or explanation, we will call you with instructions. Clinically stable results will be released to your [facility]. If you have not heard from [place] or cannot find your results in [facility] in 2 weeks please contact our office at [phone].  If you are not yet signed up for [facility], please consider signing up.   We recommend the following healthy lifestyle for LIFE: 1) Small portions. But, make sure to get regular (at least 3 per day), healthy meals and small healthy snacks if needed.  2) Eat a healthy clean diet.   TRY TO EAT: -at least 5-7 servings of low sugar, colorful, and nutrient rich vegetables per day (not corn, potatoes or bananas.) -berries are the best choice if you wish to eat fruit (only eat small amounts if trying to reduce weight)  -lean meets (fish, white meat of chicken or [place]) -vegan proteins for some meals - beans or tofu, whole grains, nuts and seeds -Replace bad fats with good fats - good fats include: fish, nuts and seeds, canola oil, olive oil -small amounts of low fat or non fat dairy -small amounts of100 % whole grains - check the lables -drink plenty of water  AVOID: -SUGAR, sweets, anything with added sugar, corn syrup or sweeteners - must read labels as even foods advertised as "healthy" often are loaded with sugar -if you must have a sweetener, small amounts of stevia may be best -sweetened beverages and artificially sweetened beverages -simple starches (rice, bread, potatoes, pasta, chips, etc - small amounts of 100% whole grains are ok) -red meat, pork, butter -fried foods, fast food, processed food, excessive dairy, eggs and coconut.  3)Get at least 150 minutes of sweaty  aerobic exercise per week.  4)Reduce stress - consider counseling, meditation and relaxation to balance other aspects of your life.

## 2018-08-04 NOTE — Addendum Note (Signed)
Addended by: Agnes Lawrence on: 08/04/2018 01:53 PM   Modules accepted: Orders

## 2018-08-10 ENCOUNTER — Other Ambulatory Visit (INDEPENDENT_AMBULATORY_CARE_PROVIDER_SITE_OTHER): Payer: PPO

## 2018-08-10 DIAGNOSIS — E7800 Pure hypercholesterolemia, unspecified: Secondary | ICD-10-CM

## 2018-08-10 DIAGNOSIS — I10 Essential (primary) hypertension: Secondary | ICD-10-CM | POA: Diagnosis not present

## 2018-08-10 DIAGNOSIS — J3089 Other allergic rhinitis: Secondary | ICD-10-CM | POA: Diagnosis not present

## 2018-08-10 DIAGNOSIS — R739 Hyperglycemia, unspecified: Secondary | ICD-10-CM | POA: Diagnosis not present

## 2018-08-10 DIAGNOSIS — J301 Allergic rhinitis due to pollen: Secondary | ICD-10-CM | POA: Diagnosis not present

## 2018-08-10 LAB — BASIC METABOLIC PANEL
BUN: 18 mg/dL (ref 6–23)
CALCIUM: 9.1 mg/dL (ref 8.4–10.5)
CHLORIDE: 102 meq/L (ref 96–112)
CO2: 29 mEq/L (ref 19–32)
Creatinine, Ser: 0.72 mg/dL (ref 0.40–1.20)
GFR: 83.11 mL/min (ref >60.00)
Glucose, Bld: 91 mg/dL (ref 70–99)
POTASSIUM: 4.2 meq/L (ref 3.5–5.1)
SODIUM: 136 meq/L (ref 135–145)

## 2018-08-10 LAB — CBC
HEMATOCRIT: 35.8 % — AB (ref 36.0–46.0)
Hemoglobin: 12 g/dL (ref 12.0–15.0)
MCHC: 33.7 g/dL (ref 30.0–36.0)
MCV: 86.7 fl (ref 78.0–100.0)
PLATELETS: 218 10*3/uL (ref 150.0–400.0)
RBC: 4.13 Mil/uL (ref 3.87–5.11)
RDW: 14.5 % (ref 11.5–15.5)
WBC: 4.3 10*3/uL (ref 4.0–10.5)

## 2018-08-10 LAB — LIPID PANEL
CHOL/HDL RATIO: 3
Cholesterol: 221 mg/dL — ABNORMAL HIGH (ref 0–200)
HDL: 74 mg/dL (ref >39.00)
LDL Cholesterol: 140 mg/dL — ABNORMAL HIGH (ref 0–99)
NonHDL: 147.45
TRIGLYCERIDES: 37 mg/dL (ref 0.0–149.0)
VLDL: 7.4 mg/dL (ref 0.0–40.0)

## 2018-08-10 LAB — HEMOGLOBIN A1C: Hgb A1c MFr Bld: 5.9 % (ref 4.6–6.5)

## 2018-08-14 DIAGNOSIS — J301 Allergic rhinitis due to pollen: Secondary | ICD-10-CM | POA: Diagnosis not present

## 2018-08-14 DIAGNOSIS — J3089 Other allergic rhinitis: Secondary | ICD-10-CM | POA: Diagnosis not present

## 2018-08-18 DIAGNOSIS — J301 Allergic rhinitis due to pollen: Secondary | ICD-10-CM | POA: Diagnosis not present

## 2018-08-18 DIAGNOSIS — J3089 Other allergic rhinitis: Secondary | ICD-10-CM | POA: Diagnosis not present

## 2018-08-26 DIAGNOSIS — J3089 Other allergic rhinitis: Secondary | ICD-10-CM | POA: Diagnosis not present

## 2018-08-26 DIAGNOSIS — J301 Allergic rhinitis due to pollen: Secondary | ICD-10-CM | POA: Diagnosis not present

## 2018-08-31 DIAGNOSIS — J3089 Other allergic rhinitis: Secondary | ICD-10-CM | POA: Diagnosis not present

## 2018-08-31 DIAGNOSIS — J301 Allergic rhinitis due to pollen: Secondary | ICD-10-CM | POA: Diagnosis not present

## 2018-09-10 ENCOUNTER — Ambulatory Visit: Payer: PPO | Admitting: Pulmonary Disease

## 2018-09-10 ENCOUNTER — Encounter: Payer: Self-pay | Admitting: Pulmonary Disease

## 2018-09-10 VITALS — BP 126/72 | HR 62 | Temp 98.4°F | Ht 63.0 in | Wt 187.4 lb

## 2018-09-10 DIAGNOSIS — J309 Allergic rhinitis, unspecified: Secondary | ICD-10-CM

## 2018-09-10 DIAGNOSIS — I10 Essential (primary) hypertension: Secondary | ICD-10-CM

## 2018-09-10 DIAGNOSIS — G4733 Obstructive sleep apnea (adult) (pediatric): Secondary | ICD-10-CM

## 2018-09-10 DIAGNOSIS — J4530 Mild persistent asthma, uncomplicated: Secondary | ICD-10-CM | POA: Diagnosis not present

## 2018-09-10 DIAGNOSIS — K219 Gastro-esophageal reflux disease without esophagitis: Secondary | ICD-10-CM | POA: Diagnosis not present

## 2018-09-10 MED ORDER — ALBUTEROL SULFATE HFA 108 (90 BASE) MCG/ACT IN AERS: 2.0000 | INHALATION_SPRAY | Freq: Four times a day (QID) | RESPIRATORY_TRACT | 3 refills | Status: AC | PRN

## 2018-09-10 MED ORDER — AZITHROMYCIN 250 MG PO TABS: ORAL_TABLET | ORAL | 3 refills | Status: DC

## 2018-09-10 MED ORDER — ALBUTEROL SULFATE (2.5 MG/3ML) 0.083% IN NEBU: 2.5000 mg | INHALATION_SOLUTION | Freq: Four times a day (QID) | RESPIRATORY_TRACT | 3 refills | Status: DC | PRN

## 2018-09-10 NOTE — Patient Instructions (Signed)
Today we updated your med list in our EPIC system...    Continue your current medications the same...    We refilled your med per request...  We wrote a new prescription for a ZPak to keep on hand this winter for infections...  We discussed my up-coming retirement & decided to turn over your asthma/ allergy follow up to [facility] [employer]...  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    Sending you & your family my very best wishes for good health & much happiness in the future!!

## 2018-09-10 NOTE — Progress Notes (Signed)
Subjective:    Patient ID: Brianna Mccarty, female    DOB: Mar 13, 1941, 77 y.o.   MRN: [id number]  HPI 77 y/o WF here for a follow up visit... she has hx refractory asthmatic bronchitis requiring hosp & IV Solumedrol & max bronchodilator Rx etc... in the outpt setting she gets frequent courses of Prednisone & we have endeavored to maximize her inhaled regimen so as to keep her out of the hosp & off the oral steroids as much as poss... she also sees DrESL/VanWinkle for allergy shots, and she has seen DrKozlow in the past who felt that she had a major component of reflux causing her refractory airways dis...  SEE PREV EPIC NOTES FOR EARLIER DATA >>  ~  April 22, 2012:  69moROV & FTylercontinues to do satis from the pulm standpoint w/o exacerbation;  She had left breast biopsy 1/13 that turned out benign- prob fibroadenoma;  She continues on Nopalea supplement w/ it's anti-inflamm properties;  She saw DrFontaine 23/88>hx lichen sclerosis- uses Temovate cream & doing well, he deferred Osteoporosis testing & follow up to u[place]& she is noted to be on both Evista & Alendronate; she wants to stop the Evista due to expense & this is ok w/ me;  She also saw DrKatz for Cards f/u 5/13> doing well & no changes made...     We reviewed prob list, meds, xrays and labs> see below>>  ~  October 21, 2012:  675moOV & Lavida notes doing well w/o recent issues, no new complaints or concerns... We reviewed the following medical problems during today's office visit >> reminded to bring all meds to every office visit...    AR, Asthma, Bronchitis> on NEBS w/ Albut, Symbicort80, Allegra180; she has last Asthma exac 8/13- treated by DrSharma w/ Depo/ Pred/ ZPak/ & incr Symbicort to 160; she improved & ret to baseline but didn't bring meds to this OV...    HBP, HxCP> on ASA81, Amlod10, Losar100, Minipress5, Lasix40;  BP=144/80 & she denies CP, palpit, SOB, edema; [facility] HBP, CP, SOB; doing satis & no  changes made...    Cerebrovasc dis> on ASA81; she had f/u CDopplers 1/12 showing stable mild heterogeneous plaque in bulbs & 0-39% ICA stenoses (incidental 1cm right thyroid cyst).    VenInsuffic> on low sodium, elevation, support hose...    Chol> on Simva20; FLP shows TChol 152, TG 34, HDL 64, LDL 81    GI- HH, GERD, Divertics> on Prev30bid & probiotic daily; she denies abd pain, dysphagia, n/v, c/d, blood seen; [place] DrStark w/ divertics only, but there is a FamHx colon ca in uncle, therefore f/u 5y63yr.    GU- IC, KidStones> on DDAVP & Elmiron100Tid; she is s/p DMSO treatments; states voiding is fine, denies recent leakage, incont, etc; last stone was 2007...    DJD, Osteopenia> on Norco5, Alendronate70/wk, calcium, MVI, VitD; she is s/p leftTHR by DrGioffre2004; [facility]    HAs> she is s/p eval in HA clinics 1994 & 2003...    HxMelanoma> [facility] medial left [facility] We reviewed prob list, meds, xrays and labs> see below for updates >> she had the 2013 Flu vaccine 10/13; she has requested refill prescriptions today...  LABS 12/13:  FLP- at goals on Simva20;  Chems- wnl;  CBC- wnl;  TSH=2.43;  VitD=41   ~  May 18, 2013:  19mo91mo &  Brianna Mccarty has had a good interval- mild allergy symptoms in the spring, doing well overall & they cut back her allergy shots to every other wk; she still credits much of her improvement to Nopolea juice that she drinks daily for the last 19yr- "it's good for inflammation & helps my asthma"...     Asthma is stable on Symbicort80- taking one puff Bid & ProventilHFA vs NEB prn...     BP looks good on Amlod10, Minipress5Bid, Cozaar100, Lasix40; BP= 128/72 & she denies CP, palpit, dizzy, SOB, edema...    Chol has been well regulated on Simva20, RYR, CoQ10 w/ FLP 12/13 within parameters...    GI is stable on Prev30Bid & probiotic daily, followed by DrStark...    GU remains stable on Elmiron &  DDAVP; followed by DrPeterson & Fontaine... We reviewed prob list, meds, xrays and labs> see below for updates >>   ~  December 06, 2013:  6-771moOV & [name] a number of questions today> notes freq fever blisters & wants rx (Acyclovir prn); wants Prevnar-13 (OK); wants CXR & BMD (ordered); has mole on her back & we will refer to Derm for excision; wonders about a CRP test (we discussed this); wants to change her PPI to Protonix (OK)... We reviewed the following medical problems during today's office visit >>     AR, Asthma, Bronchitis> on NEBS w/ Albut, Symbicort80, Allegra180; she has last Asthma exac 12/14- treated by TP w/ Depo/ Pred/ ZPak/ & incr Symbicort etc; she improved & ret to baseline but didn't bring meds to this OV...    HBP, HxCP> on ASA81, Amlod10, Losar100, Minipress5, Lasix40;  BP=138/72 & she denies CP, palpit, SOB, edema; [facility], CP, SOB; doing satis & no changes made...    Cerebrovasc dis> on ASA81; she had f/u CDopplers 4/14 showing stable mild heterogeneous plaque in bulbs & 0-39% ICA stenoses (incidental 1cm right thyroid cyst).    VenInsuffic> on low sodium, elevation, support hose...    Chol> on Simva20; FLP 2/15 shows TChol 164, TG 43, HDL 62, LDL 94    GI- HH, GERD, Divertics> on Prev30bid & probiotic daily; she denies abd pain, dysphagia, n/v, c/d, blood seen; [place] by DrStark w/ divertics only, but there is a FamHx colon ca in uncle, therefore f/u 5y19yr.    GU- IC, KidStones> on DDAVP & Elmiron100Tid; she is s/p DMSO treatments; states voiding is fine, denies recent leakage, incont, etc; last stone was 2007...    DJD, Osteopenia> on Norco5, Alendronate70/wk, calcium, MVI, VitD; she is s/p leftTHR by DrGioffre2004; last BMD 1/12 showed [facility]    HAs> she is s/p eval in HA clinics 1994 & 2003...    HxMelanoma> [facility] medial left [facility] We reviewed prob list, meds, xrays and labs> see  below for updates >>   CXR 2/15 showed norm heart size, chronic changes w/ incr markings and blunt right angle, NAD  PFTs 2/15 showed FVC=2.38 (91%), FEV1=1.56 (79%), %1sec=65%, mid-flows=38% predicted... c/w GOLD Stage1 COPD  BMD 2/15 showed TScores +0.3 in Spine and -1.8 in femNeck (improved on the Alendronate)...  LABS 2/15:  FLP at goals on Simva20;  Chems- wnl;  CBC- wnl;  TSH= 2.55;  VitD= 48   ~  June 27, 2015:  73m28mo & FranDaphnels me that she's had a good yr, feeling well, no new complaints or concerns; she proudly reports that her grandson graduated from  [employer] & is now in helicopter school;  We reviewed the following medical problems during today's office visit >>     AR, Asthma, Bronchitis> on NEBS w/ Albut (ave <1/mo), Symbicort160Bid, OTC antihist prn; she denies recent asthma exac, hasn't required antibiotics, pred, etc; stable DOE, denies cough/ sput/ hemoptysis/ CP/ etc; states juice plus helps    HBP, HxCP> on ASA81, Amlod10, Losar100, Minipress5, Lasix40;  BP=130/74 & she denies CP, palpit, SOB, edema; followed by DrKatz (last 5/13) for HBP, CP, SOB; doing satis & no changes made...    Cerebrovasc dis> on ASA81; she had f/u CDopplers 7/16 showing stable mild heterogeneous plaque in bulbs & 1-39% Bilat ICA stenoses (incidental 1cm right thyroid cyst).    VenInsuffic> on low sodium, elevation, support hose...    Chol> on Simva20; [place] 12/15 shows TChol 185, TG 37, HDL 71, LDL 107    GI- HH, GERD, Divertics> on Prilosec40 & probiotic daily; she denies abd pain, dysphagia, n/v, c/d, blood seen; known [place], f/u colon 4/16 by DrStark w/ divertics & Gr1 hems...     GU- IC, KidStones> on DDAVP & Elmiron100Tid; she is s/p DMSO treatments; states voiding is fine, denies recent leakage, incont, etc; last stone was 2007...    DJD, Osteopenia> on Norco5, Alendronate70/wk, calcium, MVI, VitD; she is s/p leftTHR by DrGioffre2004; last BMD 1/12 showed [facility]Score [facility]2.3 in [facility]...    HAs>  she is s/p eval in HA clinics 1994 & 2003...    HxMelanoma> removed from medial left knee in 2006 by DrHall... EXAM shows Afeb, VSS, O2sat=97% on RA;  HEENT- neg;  Chest- clear w/o w/r/r;  Heart- RR gr1-2/6 SEM w/o r/g;  Abd- soft, neg;  Ext- w/o c/c/e;  Neuro- intact... We reviewed prob list, meds, xrays and labs> see below for updates >> Her new PCP is DrHKim...  CDoppler 7/16 showed bilat heterogeneous plaque, stable 1-39% bilat ICAstenoses, norm subclav & patent vertebrals w/ antegrade flow (f/u 32yr)...  2DEcho 8/16 showed norm LVF w/ EF=60-65%, no regional wall motion abn, AV mildly thickened & calcif (sclerosis w/o stenosis), mitral annular calcif w/o MR/MS, LA dil at 582m no ASD or PFO, norm RV & PA... ?why the LA dil?  ~  July 03, 2016:  Yearly ROV & pulmonary recheck>  She remains on SYMBICORT160-2spBid regularly, Albuterol via NEB or HFA as needed (she reports zero use over the last 6+months), and allergy shots per DrVanWinkle;  She reports that breathing is good- denies cough, sput, hemoptysis, SOB, CP, etc; she has not had an interval URI, bronchitic infection or worse;  We gave her the 2017 flu shot today & she is otherw up-to-date on immunizations; she has been under some stress w/ her daughters health issues and personal problems...    FrJoaquim LaiPCP is DrHKim, [place] office> seen 05/30/16 w/ hx of Asthma, allergies, HBP, carotid art dis, HL, GERD, IC & kidney stones, osteoporosis, hx melanoma- stable, no changes made to her meds...    She saw DrVanWinkle 04/19/16 for Allergy f/u visit> AR, asthma, acid reflux; stable on allergy shots x 30+yrs + Allegra, Flonase, Patanase; stable 7 continued on her same regimen...    She is followed by DrNelson for Cards- seen 10/20/15> HBP, ?MVP in past- no MVP on Echo but LAdil= 5325mabn EKG w/ LAD, carotid art dis, HL; she changed Simva20 to PRAV20 due to symptoms & she is overdue for f/u FLP on the new med=> she will contact DrKim for this; AND  they decreased  her Lasix40=>20/d...     She tells me that she has had incr LBP, saw DrGioffre & treated w/ Pred 04/2016=> improved...  EXAM shows Afeb, VSS, O2sat=96% on RA;  Wt=191#;  HEENT- neg, mallampati2;  Chest- clear w/o w/r/r;  Heart- RR gr1-2/6 SEM w/o r/g;  Abd- soft, neg;  Ext- w/o c/c/e;  Neuro- intact... We reviewed prob list, meds, xrays and labs>  IMP/PLAN>>  Dub Mikes is stable on her Symbicort160-2spBid + her rescue inhaler prn;  REC to continue same, call for any problems, & we plan routine ROV 64yr..  ~  September 10, 2017:  147moOV & FrArithaeturns for yearly pulmonary ROV- feeling well, doing satis w/o new complaints or concerns... She remains on SYMBICORT160-2spBid, NEBS w/ Albut vs ProairHFA as needed, Allegra180 & Astelin nasal spray per DrHulen Lusteror allergy;  She denies interval asthma attacks or exacerbations;  She remains on weekly allergy shots;  She has continues her supplemental regimen w/ NOPALEA "It stabilizes me" she says...     Sleep study 12/13/16 in Lab per DrTurner was inadeq w/ only 37 min sleep time & showed AHI=4.8 w/ 3 hypopneasa and 14 RERAs (sl worse supine) but she did not get to REM, mod snoring, O2 desat to 87% for 17 min    Her PCP is DrHKim, seen 03/2017>  Note reviewed- hx anxiety/depression, HBP, HL, carotid dis, VI, asthma/ allergies, GERD, osteoporosis, IC, hx melanoma;     Cards- DrNelson saw pt on 06/02/17>  HBP, HL, mild AS by Echo, chr DD; on Amlod10, Losar100, Minipress5Bid, Apres50Tid, off prev Lasix due to hx hyponatremia w/ sodium down to 115 in past...    DrTurner had sched another Sleep Study but pt cancelled & wanted a Home Sleep Test => pending... EXAM shows Afeb, VSS, O2sat=98% on RA;  Wt=181#;  HEENT- neg, mallampati2;  Chest- clear w/o w/r/r;  Heart- RR gr1-2/6 SEM w/o r/g;  Abd- soft, neg;  Ext- w/o c/c/e;  Neuro- intact... We reviewed prob list, meds, xrays and labs>  IMP/PLAN>>  Good job w/ wt reduction, BP is improved w/ meds, asthma  stable on current regimen including allergy shots from DrVanWinkle...    ~  September 10, 2018:  1y38yrV & pulmonary follow up visit>               Problem List:  ALLERGIC RHINITIS (ICD-477.9) ASTHMA (ICD-493.90) - she takes ALLEGRA '180mg'$ /d, SYMBICORT 80- 1spBid, PROAIR & NEBS w/ Albuterol per DrESL; in addition she [facility], & he treats her w/ freq courses of Pred for exacerbations... ~  CXR 6/09 was clear...  ~  CXR 2/10 hosp showed mild peribronch thickening, NAD... ~  PFT 3/10 showed FVC=2.36 (90%), FEV1=1.78 (85%), %1sec=75, mid-flows= 57%pred. ~  4/10: she saw DrKozlow for second opinion & Rx of her AB & GERD... ~  CXR 2/11 showed clear lungs, NAD... f/u 7/11= same (clear, NAD)... Marland Kitchen  RAST testing 5/11 showed IgE= 130, & neg rast tests x cockroach +low titer. ~  CXR 7/11 showed mild cardiomeg, clear lungs, NAD. ~  Sinus CT 8/11 showed no signif inflamm dis, mild membrane thickening, min ethmoid changes on right. ~  PFT 8/11 showed FVC=2.04 (71%), FEV1=1.58 (73%), %1sec=78, mid-flows= 70%pred. ~  11/12:  She reports no recent infectious exac & doing better on a new supplement "nopalea" which she says reduces inflamm. ~  12/13: on NEBS w/ Albut, Symbicort80, Allegra180; she has last Asthma exac 8/13- treated by  DrSharma w/ Depo/ Pred/ ZPak/ & incr Symbicort to 160; she improved & ret to baseline but didn't bring meds to this OV... ~  7/14: Asthma is stable on Symbicort80- taking one puff Bid & ProventilHFA vs NEB prn  BRONCHITIS, RECURRENT (ICD-491.9) - hx freq infectious exac... ~  CTChest 2008 w/ 43m nodule in RUL- unchanged over 613monterval, small HH, noncalcif pleural plaques bilat... she is reassured w/ the recent CXR & serial films not showing nodule. ~  no recent resp infections but she likes to keep ZPak handy just in case.  HYPERTENSION (ICD-401.9) &  CHEST PAIN-UNSPECIFIED (ICD-786.50) - controlled on 4 meds:  NORVASC 1058m,  LOSARTAN 100m2m MINIPRESS 5mgB84m & LASIX 40mg/65m ~  2DEcho 8/09 showed norm LV wall motion & EF= 60-65%, mild incr AoV thickness, mild LA dil & atrial septal aneurysm noted. ~  EKG 9/10 by DrKatz- NSR, WNL... ~  2DEcho 9/10 showed norm LV wall motion & thickness, EF= 60-65%, gr I DD noted, sl dil LA, right side OK...  ~  9/11:  BP= 130/84, and BP's at home are even better- 120's/ 60's per pt... denies HA, visual changes, CP, palipit, dizziness, syncope, edema, etc... ~  1/12:  She had f/u appt DrKatz- stable, no changes made... ~  5/12:  BP= 122/74 and stable on meds... ~  11/12:  BP= 126/74 & similar at home she says... ~  5/13:  She saw DrKatz for Cards f/u> EKG showed NSR, rate76, poor R progression, NAD; stable & doing well- no changes made... ~  6/13:  BP= 138/78 & she is feeling well; denies CP, palpit, SOB, edema, etc... ~  12/13:  on ASA81, Amlod10, Losar100, Minipress5, Lasix40;  BP=144/80 & she denies CP, palpit, SOB, edema; [facility] for f/u HBP, CP, SOB; doing satis & no changes made... ~  7/14: BP looks good on Amlod10, Minipress5Bid, Cozaar100, Lasix40; BP= 128/72 & she denies CP, palpit, dizzy, SOB, edema.  CEREBROVASCULAR DISEASE (ICD-437.9) - on ASA 81mg/d92mshe had an abn lifeline screen 3/08 w/ left Carotid in the mild/mod range... ~  CDopplers 4/09 showed mild smooth heterogen plaque in bulbs, 0-39% ICA stenoses... ~  f/u CDoppler 1/12 by DrKatz Benay Spice mild heterogen plaque in bulbs, 0-39% bilat ICAstenoses... 1cm thyr cyst noted- not palpated.  VENOUS INSUFFICIENCY (ICD-459.81) - she knows to avoid sodium, elevate legs, wear support hose, continue the Lasix...  HYPERCHOLESTEROLEMIA (ICD-272.0) - on SIMVASTATIN 20mg/d 39mQ10 daily... prev intol to statins w/ severe leg cramps but the addition of the CoQ10 has made a world of difference to her tolerability... ~  FLP 3/08[place] diet alone showed TChol 268, TG 35, HDL 96, LDL 166... ~  FLP 9/08[place] Crestor  5mg/d sh77md TChol 212, TG 37, HDL 73, LDL 127... continue Rx. ~  FLP 12/09[place] TChol 179, TG 38, HDL 78, LDL 93... continue same Rx. ~  FLP 2/11 on [place]5 showed TChol 161, TG 42, HDL 70, LDL 83... pt switched to Simva20 per request. ~  FLP 9/11 [place] showed TChol 209, TG 42, HDL 72, LDL 127... Same med, better diet, get wt down. ~  FLP 11/12[place] Simva20 showed TChol 168, TG 35, HDL 67, LDL 94 ~  FLP 12/13 on simva20 showed TChol 152, TG 34, HDL 64, LDL 81   GERD (ICD-530.81) - on PREVACID 30mgBid +79m antireflux regimen Qhs. ~  last EGD 6/08 by DrSam showed 4cmHH, gasHoback, and stricture- dilated... prev HPylori neg in  2007.  DIVERTICULOSIS OF COLON (ICD-562.10) ~  colonoscopy 3/05 by DrSam showed divertics only... f/u planned 60yr (+fam hx in uncle). ~  colonoscopy 9/10 by DrStark showed divertics only... 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 she has had DMSO in bladder + taking> ELMIRON 10011md, & DDAVP 0.2mg10mabsHS (off prev Vesicare)... last DMSO treatment ~5/11 per pt. ~  Note:  She is followed by DrFontaine for GYN...  RENAL CALCULUS, HX OF (ICD-V13.01) - required cysto & basket stone removal from right ureter in 2007...  DEGENERATIVE JOINT DISEASE (ICD-715.90) - she is s/p left THR by DrGioffre in 2004...  OSTEOPOROSIS (ICD-733.00) - on ALENDRONATE 70mg57mstarted 1/12; + Calcium, MVI, VitD; prev on Evista & this was stopped 6/13... ~  labs 12/09 showed Vit D level = 34... advised to start Vit D OTC 1-2,000 u daily... ~  BMD 1/12 showed TScores -0.1 in Spine, and -2.3 in [facility] FOSAMAX 70mg/38mlong w/ her Calcium, MVI, VitD 5000u (esp in light of her freq Pred use). ~  6/13:  Pt wanted to know if it was ok to stop her Evista since it is so expensive; she saw DrFontaine & he deferred to us- okKoreao stop Evista. ~  BMD 2/15 showed TScores +0.3 in Spine and -1.8 in Fem[facility] and rec to continue the  Alendronate, calcium, MVI & VitD the same for now...  HEADACHE (ICD-784.0) - prev eval at the Headache clinic- 1994 by DrSpillman, and 2003 by DrFreeman...  Hx of MALIGNANT MELANOMA (ICD-172.9) - [facility] medial left knee area...  Health Maintenance - she gets the seasonal Flu vaccine yearly... given f/u PNEUMOVAX at age 49 in 3509...   Past Surgical History:  Procedure Laterality Date  . BREAST BIOPSY Left 11/25/2011   [facility]    . BREAST SURGERY  2013   Breast Bx-Benign  . CARDIAC CATHETERIZATION    . CATARACT EXTRACTION, BILATERAL    . cystoscopy and basket stone removal right ureter  02/2006   Dr. PetersTerance HartNECOLOGIC CRYOSURGERY  1971  . left total hip replacement  2004   Dr. GioffrGladstone Lighterlanoma removed from medial rleft knee area  2006   Dr. Hall  [place] SEPTUM SURGERY      Outpatient Encounter Medications as of 09/10/2018  Medication Sig  . albuterol (PROAIR HFA) 108 (90 Base) MCG/ACT inhaler Inhale 2 puffs into the lungs every 6 (six) hours as needed for wheezing.  . albuMarland Kitchenerol (PROVENTIL) (2.5 MG/3ML) 0.083% nebulizer solution Take 3 mLs (2.5 mg total) by nebulization every 6 (six) hours as needed.  . amLOMarland Kitchenipine (NORVASC) 10 MG tablet TAKE 1 TABLET BY MOUTH EVERY DAY  . Ascorbic Acid (VITAMIN C) 500 MG tablet Take 500 mg by mouth daily.    . aspiMarland Kitchenin 81 MG tablet Take 81 mg by mouth daily.    . azelMarland Kitchenstine (ASTELIN) 0.1 % nasal spray Instill 2 sprays into each nostril two times a day as needed for allergies  . budesonide-formoterol (SYMBICORT) 160-4.5 MCG/ACT inhaler Inhale 2 puffs 2 (two) times daily into the lungs.  . Calcium Carbonate (CALCIUM 600) 1500 MG TABS Take 1 tablet by mouth daily.    . Cholecalciferol (VITAMIN D3) 5000 UNITS CAPS Take 5,000 Units by mouth daily.   . Cinnamon 500 MG TABS Take 1 tablet by mouth daily.   . Coenzyme Q10 (CO  Q-10) 200 MG CAPS Take 200 mg by mouth daily.  (Patient taking differently: Take 200 mg by mouth 2 (two) times daily. )  . desmopressin (DDAVP) 0.2 MG tablet Take 0.6 mg by mouth at bedtime.   . fexofenadine (ALLEGRA) 180 MG tablet Take 180 mg by mouth daily as needed for allergies or rhinitis.  . furosemide (LASIX) 20 MG tablet TAKE 1 TABLET BY MOUTH EVERY DAY AS NEEDED  . hydrALAZINE (APRESOLINE) 50 MG tablet TAKE 1 TABLET BY MOUTH 3 TIMES A DAY AND TAKE EXTRA TABLET IF SBP>160  . hydrOXYzine (ATARAX/VISTARIL) 10 MG tablet Take 10 mg by mouth daily.  Marland Kitchen losartan (COZAAR) 100 MG tablet TAKE 1 TABLET BY MOUTH EVERY DAY *NEEDS APPT*  . magnesium oxide (MAG-OX) 400 MG tablet Take 400 mg by mouth daily.    . Multiple Vitamins-Minerals (OCUVITE ADULT 50+) CAPS Take 1 capsule by mouth daily.  Marland Kitchen olopatadine (PATANOL) 0.1 % ophthalmic solution Place 1 drop into the right eye 2 (two) times daily. (Patient taking differently: Place 1 drop into the right eye 2 (two) times daily as needed. )  . omeprazole (PRILOSEC) 40 MG capsule TAKE 1 CAPSULE BY MOUTH EVERY DAY *PT NEEDS APPT  . pentosan polysulfate (ELMIRON) 100 MG capsule Take 100 mg by mouth 3 (three) times daily before meals.    . prazosin (MINIPRESS) 5 MG capsule TAKE 1 CAPSULE BY MOUTH TWICE A DAY  . Probiotic Product (PROBIOTIC PO) Take 1 capsule by mouth daily.   . [DISCONTINUED] albuterol (PROAIR HFA) 108 (90 BASE) MCG/ACT inhaler Inhale 2 puffs into the lungs every 6 (six) hours as needed for wheezing.  . [DISCONTINUED] albuterol (PROVENTIL) (2.5 MG/3ML) 0.083% nebulizer solution Take 3 mLs (2.5 mg total) by nebulization every 6 (six) hours as needed.  Marland Kitchen azithromycin (ZITHROMAX) 250 MG tablet Take as directed  . pravastatin (PRAVACHOL) 20 MG tablet Take 1 tablet (20 mg total) by mouth every evening.   No facility-administered encounter medications on file as of 09/10/2018.     Allergies  Allergen Reactions  . Cephalexin     Reaction was a high fever  . Penicillins Hives and Swelling     Swelling of arms & face Has patient had a PCN reaction causing immediate rash, facial/tongue/throat swelling, SOB or lightheadedness with hypotension: Yes Has patient had a PCN reaction causing severe rash involving mucus membranes or skin necrosis: No Has patient had a PCN reaction that required hospitalization: No Has patient had a PCN reaction occurring within the last 10 years: No If all of the above answers are "NO", then may proceed with Cephalosporin use.   Marland Kitchen Phenobarbital Hives  . Rosuvastatin Other (See Comments)    Reports causes lower extremity muscle aches  . Sulfa Antibiotics     Cramping and nausea   . Tape Rash    Immunization History  Administered Date(s) Administered  . Influenza Split 09/18/2011, 08/11/2012, 07/28/2014  . Influenza Whole 07/21/2008, 08/04/2009, 08/01/2010  . Influenza, High Dose Seasonal PF 07/19/2013, 07/28/2015, 09/01/2017, 08/04/2018  . Influenza,inj,Quad PF,6+ Mos 07/03/2016  . Pneumococcal Conjugate-13 12/06/2013  . Pneumococcal Polysaccharide-23 10/03/2008  . Tdap 11/04/2008, 10/24/2015  . Zoster 11/05/2003    Current Medications, Allergies, Past Medical History, Past Surgical History, Family History, and Social History were reviewed in [employer] record.   Review of Systems         See HPI - all other systems neg except as noted... The patient complains of dyspnea on exertion.  The patient denies anorexia, fever, weight loss, weight gain, vision loss, decreased hearing, hoarseness, chest pain, syncope, peripheral edema, prolonged cough, headaches, hemoptysis, abdominal pain, melena, hematochezia, severe indigestion/heartburn, hematuria, incontinence, muscle weakness, suspicious skin lesions, transient blindness, difficulty walking, depression, unusual weight change, abnormal bleeding, enlarged lymph nodes, and angioedema.     Objective:   Physical Exam      WD, Overweight, 77 y/o WF in NAD... Vital Signs:   Reviewed... GENERAL:  Alert & oriented; pleasant & cooperative... HEENT:  [place]/AT, EOM-wnl, PERRLA, EACs-clear, TMs- some wax, NOSE-clear, THROAT- resolved, clear now. NECK:  Supple w/ fairROM; no JVD; normal carotid impulses w/o bruits; no thyromegaly or nodules palpated; no lymphadenopathy. CHEST:  essentially clear- without wheezing, rales, or signs of consolidation. HEART:  Regular Rhythm; Gr1-2/6 SEM w/o rubs or gallops detected... ABDOMEN:  Obese, soft & nontender; normal bowel sounds; no organomegaly or masses palpated... EXT: without deformities, mild arthritic changes; no varicose veins/ +venous insuffic/ tr edema. NEURO:  CN's intact;  no focal neuro deficits... DERM:  No lesions noted; no rash etc...  RADIOLOGY DATA:  Reviewed in the EPIC EMR & discussed w/ the patient...  LABORATORY DATA:  Reviewed in the EPIC EMR & discussed w/ the patient...   Assessment & Plan:    09/10/17>   Good job w/ wt reduction, BP is improved w/ meds, asthma stable on current regimen including allergy shots from [facility]   ASTHMA>  On regimen as above, she indicates doing well & hasn't needed Pred in yrs due to her supplements- "noplea" "juice plus" etc...  HBP>  Controlled on meds, continue same, get wt down...  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  CHOL> on Simva20, has added RYR she says, & FLP looks good...  GI>  Hx GERD stable on PPI Rx.  IC>  Followed by Urology & stable by her report...  DJD/ OSteopenia>  Now on Fosamax along w/ her Calcium, MVI, Vit D...  Other medical problems as noted...    Patient's Medications  New Prescriptions   AZITHROMYCIN (ZITHROMAX) 250 MG TABLET    Take as directed  Previous Medications   AMLODIPINE (NORVASC) 10 MG TABLET    TAKE 1 TABLET BY MOUTH EVERY DAY   ASCORBIC ACID (VITAMIN C) 500 MG TABLET    Take 500 mg by mouth daily.     ASPIRIN 81 MG TABLET    Take 81 mg  by mouth daily.     AZELASTINE (ASTELIN) 0.1 % NASAL SPRAY    Instill 2 sprays into each nostril two times a day as needed for allergies   BUDESONIDE-FORMOTEROL (SYMBICORT) 160-4.5 MCG/ACT INHALER    Inhale 2 puffs 2 (two) times daily into the lungs.   CALCIUM CARBONATE (CALCIUM 600) 1500 MG TABS    Take 1 tablet by mouth daily.     CHOLECALCIFEROL (VITAMIN D3) 5000 UNITS CAPS    Take 5,000 Units by mouth daily.    CINNAMON 500 MG TABS    Take 1 tablet by mouth daily.    COENZYME Q10 (CO Q-10) 200 MG CAPS    Take 200 mg by mouth daily.   DESMOPRESSIN (DDAVP) 0.2 MG TABLET    Take 0.6 mg by mouth at bedtime.    FEXOFENADINE (ALLEGRA) 180 MG TABLET    Take 180 mg by mouth daily as needed for allergies or rhinitis.   FUROSEMIDE (LASIX) 20 MG TABLET    TAKE 1  TABLET BY MOUTH EVERY DAY AS NEEDED   HYDRALAZINE (APRESOLINE) 50 MG TABLET    TAKE 1 TABLET BY MOUTH 3 TIMES A DAY AND TAKE EXTRA TABLET IF SBP>160   HYDROXYZINE (ATARAX/VISTARIL) 10 MG TABLET    Take 10 mg by mouth daily.   LOSARTAN (COZAAR) 100 MG TABLET    TAKE 1 TABLET BY MOUTH EVERY DAY *NEEDS APPT*   MAGNESIUM OXIDE (MAG-OX) 400 MG TABLET    Take 400 mg by mouth daily.     MULTIPLE VITAMINS-MINERALS (OCUVITE ADULT 50+) CAPS    Take 1 capsule by mouth daily.   OLOPATADINE (PATANOL) 0.1 % OPHTHALMIC SOLUTION    Place 1 drop into the right eye 2 (two) times daily.   OMEPRAZOLE (PRILOSEC) 40 MG CAPSULE    TAKE 1 CAPSULE BY MOUTH EVERY DAY *PT NEEDS APPT   PENTOSAN POLYSULFATE (ELMIRON) 100 MG CAPSULE    Take 100 mg by mouth 3 (three) times daily before meals.     PRAVASTATIN (PRAVACHOL) 20 MG TABLET    Take 1 tablet (20 mg total) by mouth every evening.   PRAZOSIN (MINIPRESS) 5 MG CAPSULE    TAKE 1 CAPSULE BY MOUTH TWICE A DAY   PROBIOTIC PRODUCT (PROBIOTIC PO)    Take 1 capsule by mouth daily.   Modified Medications   Modified Medication Previous Medication   ALBUTEROL (PROAIR HFA) 108 (90 BASE) MCG/ACT INHALER albuterol (PROAIR HFA) 108  (90 BASE) MCG/ACT inhaler      Inhale 2 puffs into the lungs every 6 (six) hours as needed for wheezing.    Inhale 2 puffs into the lungs every 6 (six) hours as needed for wheezing.   ALBUTEROL (PROVENTIL) (2.5 MG/3ML) 0.083% NEBULIZER SOLUTION albuterol (PROVENTIL) (2.5 MG/3ML) 0.083% nebulizer solution      Take 3 mLs (2.5 mg total) by nebulization every 6 (six) hours as needed.    Take 3 mLs (2.5 mg total) by nebulization every 6 (six) hours as needed.  Discontinued Medications   No medications on file

## 2018-09-11 DIAGNOSIS — J3089 Other allergic rhinitis: Secondary | ICD-10-CM | POA: Diagnosis not present

## 2018-09-11 DIAGNOSIS — J4540 Moderate persistent asthma, uncomplicated: Secondary | ICD-10-CM | POA: Diagnosis not present

## 2018-09-11 DIAGNOSIS — J301 Allergic rhinitis due to pollen: Secondary | ICD-10-CM | POA: Diagnosis not present

## 2018-09-11 DIAGNOSIS — H1045 Other chronic allergic conjunctivitis: Secondary | ICD-10-CM | POA: Diagnosis not present

## 2018-09-21 DIAGNOSIS — J3089 Other allergic rhinitis: Secondary | ICD-10-CM | POA: Diagnosis not present

## 2018-09-21 DIAGNOSIS — J301 Allergic rhinitis due to pollen: Secondary | ICD-10-CM | POA: Diagnosis not present

## 2018-09-25 ENCOUNTER — Other Ambulatory Visit: Payer: Self-pay | Admitting: Family Medicine

## 2018-09-28 DIAGNOSIS — J3089 Other allergic rhinitis: Secondary | ICD-10-CM | POA: Diagnosis not present

## 2018-09-28 DIAGNOSIS — J301 Allergic rhinitis due to pollen: Secondary | ICD-10-CM | POA: Diagnosis not present

## 2018-10-05 ENCOUNTER — Other Ambulatory Visit: Payer: Self-pay | Admitting: Family Medicine

## 2018-10-05 DIAGNOSIS — J301 Allergic rhinitis due to pollen: Secondary | ICD-10-CM | POA: Diagnosis not present

## 2018-10-05 DIAGNOSIS — J3089 Other allergic rhinitis: Secondary | ICD-10-CM | POA: Diagnosis not present

## 2018-10-13 DIAGNOSIS — H6123 Impacted cerumen, bilateral: Secondary | ICD-10-CM | POA: Diagnosis not present

## 2018-10-14 ENCOUNTER — Encounter: Payer: Self-pay | Admitting: Family Medicine

## 2018-10-14 ENCOUNTER — Ambulatory Visit (INDEPENDENT_AMBULATORY_CARE_PROVIDER_SITE_OTHER): Payer: PPO | Admitting: Family Medicine

## 2018-10-14 VITALS — BP 128/66 | HR 68 | Temp 97.8°F | Wt 186.6 lb

## 2018-10-14 DIAGNOSIS — R110 Nausea: Secondary | ICD-10-CM

## 2018-10-14 DIAGNOSIS — J3089 Other allergic rhinitis: Secondary | ICD-10-CM | POA: Diagnosis not present

## 2018-10-14 DIAGNOSIS — J301 Allergic rhinitis due to pollen: Secondary | ICD-10-CM | POA: Diagnosis not present

## 2018-10-14 LAB — POC URINALSYSI DIPSTICK (AUTOMATED)
Bilirubin, UA: NEGATIVE
Blood, UA: NEGATIVE
Glucose, UA: NEGATIVE
Ketones, UA: NEGATIVE
Leukocytes, UA: NEGATIVE
NITRITE UA: NEGATIVE
Protein, UA: NEGATIVE
Spec Grav, UA: 1.015 (ref 1.010–1.025)
Urobilinogen, UA: 0.2 E.U./dL
pH, UA: 7 (ref 5.0–8.0)

## 2018-10-14 LAB — CBC WITH DIFFERENTIAL/PLATELET
BASOS ABS: 0.1 10*3/uL (ref 0.0–0.1)
BASOS PCT: 1.2 % (ref 0.0–3.0)
EOS ABS: 0.1 10*3/uL (ref 0.0–0.7)
EOS PCT: 1.8 % (ref 0.0–5.0)
HEMATOCRIT: 36.7 % (ref 36.0–46.0)
Hemoglobin: 12.3 g/dL (ref 12.0–15.0)
Lymphocytes Relative: 25.1 % (ref 12.0–46.0)
Lymphs Abs: 1.2 10*3/uL (ref 0.7–4.0)
MCHC: 33.4 g/dL (ref 30.0–36.0)
MCV: 87.4 fl (ref 78.0–100.0)
MONOS PCT: 11.4 % (ref 3.0–12.0)
Monocytes Absolute: 0.5 10*3/uL (ref 0.1–1.0)
NEUTROS ABS: 2.9 10*3/uL (ref 1.4–7.7)
NEUTROS PCT: 60.5 % (ref 43.0–77.0)
PLATELETS: 225 10*3/uL (ref 150.0–400.0)
RBC: 4.2 Mil/uL (ref 3.87–5.11)
RDW: 14.2 % (ref 11.5–15.5)
WBC: 4.7 10*3/uL (ref 4.0–10.5)

## 2018-10-14 LAB — BASIC METABOLIC PANEL
BUN: 18 mg/dL (ref 6–23)
CO2: 30 meq/L (ref 19–32)
Calcium: 9.3 mg/dL (ref 8.4–10.5)
Chloride: 103 mEq/L (ref 96–112)
Creatinine, Ser: 0.66 mg/dL (ref 0.40–1.20)
GFR: 91.84 mL/min (ref >60.00)
GLUCOSE: 102 mg/dL — AB (ref 70–99)
Potassium: 4 mEq/L (ref 3.5–5.1)
SODIUM: 139 meq/L (ref 135–145)

## 2018-10-14 LAB — HEPATIC FUNCTION PANEL
ALBUMIN: 4.2 g/dL (ref 3.5–5.2)
ALT: 11 U/L (ref 0–35)
AST: 14 U/L (ref 0–37)
Alkaline Phosphatase: 60 U/L (ref 39–117)
BILIRUBIN DIRECT: 0.1 mg/dL (ref 0.0–0.3)
BILIRUBIN TOTAL: 0.3 mg/dL (ref 0.2–1.2)
Total Protein: 6.4 g/dL (ref 6.0–8.3)

## 2018-10-14 LAB — TSH: TSH: 1.69 u[IU]/mL (ref 0.35–4.50)

## 2018-10-14 MED ORDER — ONDANSETRON HCL 8 MG PO TABS: 8.0000 mg | ORAL_TABLET | Freq: Four times a day (QID) | ORAL | 2 refills | Status: DC | PRN

## 2018-10-14 NOTE — Progress Notes (Signed)
   Subjective:    Patient ID: Brianna Mccarty, female    DOB: Dec 12, 1940, 77 y.o.   MRN: [id number]  HPI Here for 3 weeks of nausea that comes and goes. She has not vomited. There is no abdominal pain. She is taking Omeprazole as usual. BMs have not changed. No urinary symptoms. No fever. No recent medication changes.    Review of Systems  Constitutional: Negative.   Respiratory: Negative.   Cardiovascular: Negative.   Gastrointestinal: Positive for nausea. Negative for abdominal distention, abdominal pain, anal bleeding, blood in stool, constipation, diarrhea, rectal pain and vomiting.  Genitourinary: Negative.   Neurological: Negative.        Objective:   Physical Exam  Constitutional: She appears well-developed and well-nourished. No distress.  Cardiovascular: Normal rate, regular rhythm, normal heart sounds and intact distal pulses.  Pulmonary/Chest: Effort normal and breath sounds normal.  Abdominal: Soft. Bowel sounds are normal. She exhibits no distension and no mass. There is no tenderness. There is no rebound and no guarding.          Assessment & Plan:  Nausea of uncertain etiology. Use Zofran prn. Get labs today and set up an abdominal US.  Alysia Penna, MD

## 2018-10-23 ENCOUNTER — Ambulatory Visit: Payer: PPO

## 2018-11-02 ENCOUNTER — Ambulatory Visit
Admission: RE | Admit: 2018-11-02 | Discharge: 2018-11-02 | Disposition: A | Discharge status: Home / Self Care | Payer: PPO | Source: Ambulatory Visit | Attending: Family Medicine | Admitting: Family Medicine

## 2018-11-02 DIAGNOSIS — N281 Cyst of kidney, acquired: Secondary | ICD-10-CM | POA: Diagnosis not present

## 2018-11-02 DIAGNOSIS — R110 Nausea: Secondary | ICD-10-CM

## 2018-11-02 DIAGNOSIS — J3089 Other allergic rhinitis: Secondary | ICD-10-CM | POA: Diagnosis not present

## 2018-11-02 DIAGNOSIS — J301 Allergic rhinitis due to pollen: Secondary | ICD-10-CM | POA: Diagnosis not present

## 2018-11-06 ENCOUNTER — Telehealth: Payer: Self-pay | Admitting: *Deleted

## 2018-11-06 DIAGNOSIS — K862 Cyst of pancreas: Secondary | ICD-10-CM

## 2018-11-06 NOTE — Telephone Encounter (Signed)
Copied from [place] [id number]. Topic: General - Other >> Nov 06, 2018  9:38 AM Percell Belt A wrote: Reason for CRM: Pt was returning Dr Sarajane Jews phone call about her ultra sound results

## 2018-11-06 NOTE — Telephone Encounter (Signed)
I spoke to her about the [place] results and we will set up an abdominal MRI to get a better look at the pancreas. She says she actually feels better today with less nausea.

## 2018-11-06 NOTE — Telephone Encounter (Signed)
Copied from [place] [id number]. Topic: General - Other >> Nov 06, 2018  9:38 AM Percell Belt A wrote: Reason for CRM: Pt was returning Dr Sarajane Jews phone call about her ultra sound results >> Nov 06, 2018  2:44 PM Oneta Rack wrote: Patient returning call regarding imaging results

## 2018-11-09 ENCOUNTER — Ambulatory Visit: Payer: PPO

## 2018-11-11 DIAGNOSIS — J301 Allergic rhinitis due to pollen: Secondary | ICD-10-CM | POA: Diagnosis not present

## 2018-11-11 DIAGNOSIS — J3089 Other allergic rhinitis: Secondary | ICD-10-CM | POA: Diagnosis not present

## 2018-11-20 ENCOUNTER — Ambulatory Visit
Admission: RE | Admit: 2018-11-20 | Discharge: 2018-11-20 | Disposition: A | Discharge status: Home / Self Care | Payer: PPO | Source: Ambulatory Visit | Attending: Family Medicine | Admitting: Family Medicine

## 2018-11-20 DIAGNOSIS — K862 Cyst of pancreas: Secondary | ICD-10-CM

## 2018-11-20 MED ORDER — GADOBENATE DIMEGLUMINE 529 MG/ML IV SOLN
17.0000 mL | Freq: Once | INTRAVENOUS | Status: AC | PRN
  Administered 2018-11-20: 17 mL via INTRAVENOUS

## 2018-11-22 NOTE — Progress Notes (Deleted)
HPI:  Using dictation device. Unfortunately this device frequently misinterprets words/phrases.  Follow up pancreatic lesion: -she saw my colleague, Dr. Sarajane Jews, 10/14/18 for some nausea - [place] was obtained showing pancreatic lesion and MRI was advised. 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 Small GB polyps without other biliary abnormalities per report. -pt reports: -denies:   ROS: See pertinent positives and negatives per HPI.  Past Medical History:  Diagnosis Date  . Abnormal EKG    Normal LV function in the past  . Allergic rhinitis   . Asthma   . Bronchitis, mucopurulent recurrent ([place])   . Carotid artery disease ([place])    Doppler, April, 2009, 0-39% bilateral  . Cervical dysplasia 1971  . Chest pain, unspecified   . Diverticulosis of colon   . DJD (degenerative joint disease)   . Ejection fraction    EF 60%, echo, 2009  . GERD (gastroesophageal reflux disease)   . Headache(784.0)   . Hypercholesterolemia   . Hypertension   . Interstitial cystitis    sees urologist  . Lichen sclerosus   . Malignant melanoma ([place])    sees Dr. [place] Crane in dermatology  . Migraines   . Mitral valve disease    Question mitral valve prolapse in the past, no prolapse by echo 2009  . Murmur 10/20/2015  . Osteoporosis    on fosomax > 5 years, stopped 11/2015  . Renal calculus    sees urologist  . Thyroid cyst    1 x 1.1 thyroid cyst noted on carotid Doppler, January, 2012  . Venous insufficiency     Past Surgical History:  Procedure Laterality Date  . BREAST BIOPSY Left 11/25/2011   U/S core, benign performed at [facility]  . BREAST CYST ASPIRATION    . BREAST SURGERY  2013   Breast Bx-Benign  . CARDIAC CATHETERIZATION    . CATARACT EXTRACTION, BILATERAL    . cystoscopy and basket stone removal right ureter  02/2006   Dr. Terance Hart  . GYNECOLOGIC CRYOSURGERY  1971  . left total hip replacement  2004   Dr. Gladstone Lighter  .  melanoma removed from medial rleft knee area  2006   Dr. [place] Crane  . NASAL SEPTUM SURGERY      Family History  Problem Relation Age of Onset  . Cancer Father        Pancreatic  . Diabetes Mother   . Heart disease Mother   . Cancer Brother        Bile duct  . Diabetes Brother   . Hypertension Brother   . Diabetes Brother   . Heart disease Brother   . Hypertension Brother   . Hypertension Sister   . Hypertension Sister   . Colon cancer Paternal Uncle     SOCIAL HX: ***   Current Outpatient Medications:  .  albuterol (PROAIR HFA) 108 (90 Base) MCG/ACT inhaler, Inhale 2 puffs into the lungs every 6 (six) hours as needed for wheezing., Disp: 3 Inhaler, Rfl: 3 .  albuterol (PROVENTIL) (2.5 MG/3ML) 0.083% nebulizer solution, Take 3 mLs (2.5 mg total) by nebulization every 6 (six) hours as needed., Disp: 360 mL, Rfl: 3 .  amLODipine (NORVASC) 10 MG tablet, TAKE 1 TABLET BY MOUTH EVERY DAY, Disp: 90 tablet, Rfl: 1 .  Ascorbic Acid (VITAMIN C) 500 MG tablet, Take 500 mg by mouth daily.  , Disp: , Rfl:  .  aspirin 81 MG tablet, Take 81 mg by  mouth daily.  , Disp: , Rfl:  .  azelastine (ASTELIN) 0.1 % nasal spray, Instill 2 sprays into each nostril two times a day as needed for allergies, Disp: , Rfl: 5 .  azithromycin (ZITHROMAX) 250 MG tablet, Take as directed (Patient not taking: Reported on 10/14/2018), Disp: 6 tablet, Rfl: 3 .  budesonide-formoterol (SYMBICORT) 160-4.5 MCG/ACT inhaler, Inhale 2 puffs 2 (two) times daily into the lungs., Disp: 3 Inhaler, Rfl: 3 .  Calcium Carbonate (CALCIUM 600) 1500 MG TABS, Take 1 tablet by mouth daily.  , Disp: , Rfl:  .  Cholecalciferol (VITAMIN D3) 5000 UNITS CAPS, Take 5,000 Units by mouth daily. , Disp: , Rfl:  .  Cinnamon 500 MG TABS, Take 1 tablet by mouth daily. , Disp: , Rfl:  .  Coenzyme Q10 (CO Q-10) 200 MG CAPS, Take 200 mg by mouth daily. (Patient taking differently: Take 200 mg by mouth 2 (two) times daily. ), Disp: 30 each, Rfl: 0 .   desmopressin (DDAVP) 0.2 MG tablet, Take 0.6 mg by mouth at bedtime. , Disp: , Rfl:  .  fexofenadine (ALLEGRA) 180 MG tablet, Take 180 mg by mouth daily as needed for allergies or rhinitis., Disp: , Rfl:  .  furosemide (LASIX) 20 MG tablet, TAKE 1 TABLET BY MOUTH EVERY DAY AS NEEDED, Disp: 30 tablet, Rfl: 3 .  hydrALAZINE (APRESOLINE) 50 MG tablet, TAKE 1 TABLET BY MOUTH 3 TIMES A DAY AND TAKE EXTRA TABLET IF SBP>160, Disp: 360 tablet, Rfl: 3 .  hydrOXYzine (ATARAX/VISTARIL) 10 MG tablet, Take 10 mg by mouth daily., Disp: , Rfl:  .  losartan (COZAAR) 100 MG tablet, TAKE 1 TABLET BY MOUTH EVERY DAY *NEEDS APPT*, Disp: 90 tablet, Rfl: 1 .  magnesium oxide (MAG-OX) 400 MG tablet, Take 400 mg by mouth daily.  , Disp: , Rfl:  .  Multiple Vitamins-Minerals (OCUVITE ADULT 50+) CAPS, Take 1 capsule by mouth daily., Disp: , Rfl:  .  olopatadine (PATANOL) 0.1 % ophthalmic solution, Place 1 drop into the right eye 2 (two) times daily. (Patient taking differently: Place 1 drop into the right eye 2 (two) times daily as needed. ), Disp: 5 mL, Rfl: 1 .  omeprazole (PRILOSEC) 40 MG capsule, TAKE 1 CAPSULE BY MOUTH EVERY DAY *PT NEEDS APPT, Disp: 90 capsule, Rfl: 1 .  ondansetron (ZOFRAN) 8 MG tablet, Take 1 tablet (8 mg total) by mouth every 6 (six) hours as needed for nausea or vomiting., Disp: 60 tablet, Rfl: 2 .  pentosan polysulfate (ELMIRON) 100 MG capsule, Take 100 mg by mouth 3 (three) times daily before meals.  , Disp: , Rfl:  .  pravastatin (PRAVACHOL) 20 MG tablet, Take 1 tablet (20 mg total) by mouth every evening., Disp: 90 tablet, Rfl: 3 .  prazosin (MINIPRESS) 5 MG capsule, TAKE 1 CAPSULE BY MOUTH TWICE A DAY, Disp: 180 capsule, Rfl: 1 .  Probiotic Product (PROBIOTIC PO), Take 1 capsule by mouth daily. , Disp: , Rfl:   EXAM:  There were no vitals filed for this visit.  There is no height or weight on file to calculate BMI.  GENERAL: vitals reviewed and listed above, alert, oriented, appears well  hydrated and in no acute distress  HEENT: atraumatic, conjunttiva clear, no obvious abnormalities on inspection of external nose and ears  NECK: no obvious masses on inspection  LUNGS: clear to auscultation bilaterally, no wheezes, rales or rhonchi, good air movement  CV: HRRR, no peripheral edema  MS: moves all extremities without  noticeable abnormality *** PSYCH: pleasant and cooperative, no obvious depression or anxiety  ASSESSMENT AND PLAN:  Discussed the following assessment and plan:  No diagnosis found.  -reviewed the radiology report with pt in details and discussed potential etiologies per the report. Discussed should follow recommendations to repeat imaging to assess stability. Also offered referral to gi for this and the symptoms. -she opted for: -follow up 3 months -Patient advised to return or notify a doctor immediately if symptoms worsen or persist or new concerns arise.  There are no Patient Instructions on file for this visit.  Lucretia Kern, DO

## 2018-11-23 ENCOUNTER — Other Ambulatory Visit: Payer: Self-pay | Admitting: Family Medicine

## 2018-11-23 ENCOUNTER — Telehealth: Payer: Self-pay | Admitting: *Deleted

## 2018-11-23 ENCOUNTER — Ambulatory Visit: Payer: PPO | Admitting: Family Medicine

## 2018-11-23 DIAGNOSIS — R110 Nausea: Secondary | ICD-10-CM

## 2018-11-23 DIAGNOSIS — K862 Cyst of pancreas: Secondary | ICD-10-CM

## 2018-11-23 DIAGNOSIS — R935 Abnormal findings on diagnostic imaging of other abdominal regions, including retroperitoneum: Secondary | ICD-10-CM

## 2018-11-23 DIAGNOSIS — Z0289 Encounter for other administrative examinations: Secondary | ICD-10-CM

## 2018-11-23 DIAGNOSIS — J301 Allergic rhinitis due to pollen: Secondary | ICD-10-CM | POA: Diagnosis not present

## 2018-11-23 DIAGNOSIS — J3089 Other allergic rhinitis: Secondary | ICD-10-CM | POA: Diagnosis not present

## 2018-11-23 NOTE — Telephone Encounter (Signed)
-----   Message from Lucretia Kern, DO sent at 11/23/2018 12:04 PM EST ----- Regarding: appointment/MRI Please call pt. She missed appt. See how she is doing.  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  Radiologist advised repeat MRI in 6 months. Please order and set reminder to check to ensure done in 6 months.  Could also have her see GI about this given she was not feeling well. Ok to refer is she is ok with this.  She needs AWV with [facility] and follow up with me in next 1 month. Assist her in scheduling.  Thanks.   Notes for visit. Follow up pancreatic lesion: -she saw my colleague, Dr. Sarajane Jews, 10/14/18 for some nausea - [place] was obtained showing pancreatic lesion and MRI was advised. 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 Small GB polyps without other biliary abnormalities per report.

## 2018-11-23 NOTE — Telephone Encounter (Signed)
Patient called back and was informed of the message below.  Patient stated she was not aware she had an appt today.  Appts for the wellness visit and follow up appts scheduled for 2/4.  Patient is aware the referral was placed to GI and someone will call with appt info.

## 2018-11-23 NOTE — Telephone Encounter (Signed)
I left a message for the pt to return my call. 

## 2018-11-24 ENCOUNTER — Encounter: Payer: Self-pay | Admitting: Gastroenterology

## 2018-12-08 ENCOUNTER — Encounter: Payer: Self-pay | Admitting: Family Medicine

## 2018-12-08 ENCOUNTER — Ambulatory Visit (INDEPENDENT_AMBULATORY_CARE_PROVIDER_SITE_OTHER): Payer: PPO

## 2018-12-08 ENCOUNTER — Ambulatory Visit (INDEPENDENT_AMBULATORY_CARE_PROVIDER_SITE_OTHER): Payer: PPO | Admitting: Family Medicine

## 2018-12-08 VITALS — BP 110/50 | HR 70 | Temp 97.8°F | Resp 16 | Ht 63.0 in | Wt 183.0 lb

## 2018-12-08 VITALS — BP 110/50 | HR 70 | Temp 97.8°F | Ht 63.0 in | Wt 183.8 lb

## 2018-12-08 DIAGNOSIS — Z0000 Encounter for general adult medical examination without abnormal findings: Secondary | ICD-10-CM

## 2018-12-08 DIAGNOSIS — R110 Nausea: Secondary | ICD-10-CM

## 2018-12-08 DIAGNOSIS — Z13820 Encounter for screening for osteoporosis: Secondary | ICD-10-CM

## 2018-12-08 DIAGNOSIS — R739 Hyperglycemia, unspecified: Secondary | ICD-10-CM | POA: Diagnosis not present

## 2018-12-08 DIAGNOSIS — I10 Essential (primary) hypertension: Secondary | ICD-10-CM | POA: Diagnosis not present

## 2018-12-08 DIAGNOSIS — K5900 Constipation, unspecified: Secondary | ICD-10-CM | POA: Diagnosis not present

## 2018-12-08 DIAGNOSIS — K862 Cyst of pancreas: Secondary | ICD-10-CM

## 2018-12-08 DIAGNOSIS — J3089 Other allergic rhinitis: Secondary | ICD-10-CM | POA: Diagnosis not present

## 2018-12-08 DIAGNOSIS — J301 Allergic rhinitis due to pollen: Secondary | ICD-10-CM | POA: Diagnosis not present

## 2018-12-08 DIAGNOSIS — E669 Obesity, unspecified: Secondary | ICD-10-CM | POA: Diagnosis not present

## 2018-12-08 NOTE — Patient Instructions (Signed)
BEFORE YOU LEAVE: -print lab results -follow up: AWV and CPE in 3 months  See the gastroenterologist as planned. Use the mirilax in the interim.   We recommend the following healthy lifestyle for LIFE: 1) Small portions. But, make sure to get regular (at least 3 per day), healthy meals and small healthy snacks if needed.  2) Eat a healthy clean diet.   TRY TO EAT: -at least 5-7 servings of low sugar, colorful, and nutrient rich vegetables per day (not corn, potatoes or bananas.) -berries are the best choice if you wish to eat fruit (only eat small amounts if trying to reduce weight)  -lean meets (fish, white meat of chicken or [place]) -vegan proteins for some meals - beans or tofu, whole grains, nuts and seeds -Replace bad fats with good fats - good fats include: fish, nuts and seeds, canola oil, olive oil -small amounts of low fat or non fat dairy -small amounts of100 % whole grains - check the lables -drink plenty of water  AVOID: -SUGAR, sweets, anything with added sugar, corn syrup or sweeteners - must read labels as even foods advertised as "healthy" often are loaded with sugar -if you must have a sweetener, small amounts of stevia may be best -sweetened beverages and artificially sweetened beverages -simple starches (rice, bread, potatoes, pasta, chips, etc - small amounts of 100% whole grains are ok) -red meat, pork, butter -fried foods, fast food, processed food, excessive dairy, eggs and coconut.  3)Get at least 150 minutes of sweaty aerobic exercise per week.  4)Reduce stress - consider counseling, meditation and relaxation to balance other aspects of your life.

## 2018-12-08 NOTE — Progress Notes (Signed)
Subjective:   Brianna Mccarty is a 78 y.o. female who presents for Medicare Annual (Subsequent) preventive examination.  Review of Systems:  No ROS.  Medicare Wellness Visit. Additional risk factors are reflected in the social history.  Cardiac Risk Factors include: advanced age (>66men, >23 women);hypertension;dyslipidemia;obesity (BMI >30kg/m2) Sleep patterns: no sleep issues and feels rested on waking.    Home Safety/Smoke Alarms: Feels safe in home. Smoke alarms in place.  Living environment; residence and Firearm Safety: No ambulation issues reported, no recent falls. No use or need for DME at this time.  Seat Belt Safety/Bike Helmet: Wears seat belt.   Female:   Pap- N/A as pt. Over 15yo     Mammo- scheduled this month at [facility] scan- 01/2017, order placed        CCS- 02/2015, following up with Dr. Fuller Plan soon.      Objective:     Vitals: BP (!) 110/50   Pulse 70   Temp 97.8 F (36.6 C)   Resp 16   Ht 5\' 3"  (1.6 m)   Wt 183 lb (83 kg)   SpO2 96%   BMI 32.42 kg/m   Body mass index is 32.42 kg/m.  Advanced Directives 12/08/2018 10/22/2017 05/24/2017 05/24/2017 03/07/2017 12/13/2016 08/06/2016  Does Patient Have a Medical Advance Directive? Yes Yes - Yes Yes Yes Yes  Type of Advance Directive [place];Living will - - [place];Living will [place];Living will Living will [place];Living will  Does patient want to make changes to medical advance directive? No - Patient declined - - No - Patient declined No - Patient declined - -  Copy of [place] in Chart? No - copy requested - No - copy requested No - copy requested - - No - copy requested  Would patient like information on creating a medical advance directive? - - - - - - No - patient declined information    Tobacco Social History   Tobacco Use  Smoking Status Never Smoker  Smokeless Tobacco Never Used       Counseling given: Not Answered   Past Medical History:  Diagnosis Date  . Abnormal EKG    Normal LV function in the past  . Allergic rhinitis   . Asthma   . Bronchitis, mucopurulent recurrent ([place])   . Carotid artery disease ([place])    Doppler, April, 2009, 0-39% bilateral  . Cervical dysplasia 1971  . Chest pain, unspecified   . Diverticulosis of colon   . DJD (degenerative joint disease)   . Ejection fraction    EF 60%, echo, 2009  . GERD (gastroesophageal reflux disease)   . Headache(784.0)   . Hypercholesterolemia   . Hypertension   . Interstitial cystitis    sees urologist  . Lichen sclerosus   . Malignant melanoma ([place])    sees Dr. [place] Crane in dermatology  . Migraines   . Mitral valve disease    Question mitral valve prolapse in the past, no prolapse by echo 2009  . Murmur 10/20/2015  . Osteoporosis    on fosomax > 5 years, stopped 11/2015  . Renal calculus    sees urologist  . Thyroid cyst    1 x 1.1 thyroid cyst noted on carotid Doppler, January, 2012  . Venous insufficiency    Past Surgical History:  Procedure Laterality Date  . BREAST BIOPSY Left 11/25/2011   U/S core, benign  performed at [facility]  . BREAST CYST ASPIRATION    . BREAST SURGERY  2013   Breast Bx-Benign  . CARDIAC CATHETERIZATION    . CATARACT EXTRACTION, BILATERAL    . cystoscopy and basket stone removal right ureter  02/2006   Dr. Terance Hart  . GYNECOLOGIC CRYOSURGERY  1971  . left total hip replacement  2004   Dr. Gladstone Lighter  . melanoma removed from medial rleft knee area  2006   Dr. [place] Crane  . NASAL SEPTUM SURGERY     Family History  Problem Relation Age of Onset  . Cancer Father        Pancreatic  . Diabetes Mother   . Heart disease Mother   . Cancer Brother        Bile duct  . Diabetes Brother   . Hypertension Brother   . Diabetes Brother   . Heart disease Brother   . Hypertension Brother   . Hypertension Sister   . Hypertension Sister   . Colon cancer Paternal Uncle    Social  History   Socioeconomic History  . Marital status: Married    Spouse name: Edison Nasuti  . Number of children: 2  . Years of education: Not on file  . Highest education level: Not on file  Occupational History  . Occupation: retired    Fish farm manager: RETIRED  Social Needs  . Financial resource strain: Not hard at all  . Food insecurity:    Worry: Never true    Inability: Never true  . Transportation needs:    Medical: No    Non-medical: No  Tobacco Use  . Smoking status: Never Smoker  . Smokeless tobacco: Never Used  Substance and Sexual Activity  . Alcohol use: Yes    Alcohol/week: 1.0 standard drinks    Types: 1 Standard drinks or equivalent per week    Comment: social use  . Drug use: No  . Sexual activity: Not Currently    Birth control/protection: Post-menopausal    Comment: 1st intercourse 48 yo-1 partner  Lifestyle  . Physical activity:    Days per week: 3 days    Minutes per session: 30 min  . Stress: To some extent  Relationships  . Social connections:    Talks on phone: More than three times a week    Gets together: Three times a week    Attends religious service: More than 4 times per year    Active member of club or organization: Yes    Attends meetings of clubs or organizations: More than 4 times per year    Relationship status: Married  Other Topics Concern  . Not on file  Social History Narrative   Updated 11/2015   Work or School: none      Home Situation: lives with husband      Spiritual Beliefs: christian      Lifestyle: regular exercise; healthy diet      12/08/2018: Uses treadmill 3/x weekly.    Looks after sister who has been having memory decline, as well  as husband with minor memory decline.    Enjoys writing poetry, reading, going to church, travel    Outpatient Encounter Medications as of 12/08/2018  Medication Sig  . albuterol (PROAIR HFA) 108 (90 Base) MCG/ACT inhaler Inhale 2 puffs into the lungs every 6 (six) hours as needed for wheezing.    Marland Kitchen albuterol (PROVENTIL) (2.5 MG/3ML) 0.083% nebulizer solution Take 3 mLs (2.5 mg total) by nebulization every 6 (six) hours as needed.  Marland Kitchen  amLODipine (NORVASC) 10 MG tablet TAKE 1 TABLET BY MOUTH EVERY DAY  . Ascorbic Acid (VITAMIN C) 500 MG tablet Take 500 mg by mouth daily.    Marland Kitchen aspirin 81 MG tablet Take 81 mg by mouth daily.    Marland Kitchen azelastine (ASTELIN) 0.1 % nasal spray Instill 2 sprays into each nostril two times a day as needed for allergies  . budesonide-formoterol (SYMBICORT) 160-4.5 MCG/ACT inhaler Inhale 2 puffs 2 (two) times daily into the lungs.  . Calcium Carbonate (CALCIUM 600) 1500 MG TABS Take 1 tablet by mouth daily.    . Cholecalciferol (VITAMIN D3) 5000 UNITS CAPS Take 5,000 Units by mouth daily.   . Cinnamon 500 MG TABS Take 1 tablet by mouth daily.   . Coenzyme Q10 (CO Q-10) 200 MG CAPS Take 200 mg by mouth daily. (Patient taking differently: Take 200 mg by mouth 2 (two) times daily. )  . desmopressin (DDAVP) 0.2 MG tablet Take 0.6 mg by mouth at bedtime.   . fexofenadine (ALLEGRA) 180 MG tablet Take 180 mg by mouth daily as needed for allergies or rhinitis.  . furosemide (LASIX) 20 MG tablet TAKE 1 TABLET BY MOUTH EVERY DAY AS NEEDED  . hydrALAZINE (APRESOLINE) 50 MG tablet TAKE 1 TABLET BY MOUTH 3 TIMES A DAY AND TAKE EXTRA TABLET IF SBP>160  . hydrOXYzine (ATARAX/VISTARIL) 10 MG tablet Take 10 mg by mouth daily.  Marland Kitchen losartan (COZAAR) 100 MG tablet TAKE 1 TABLET BY MOUTH EVERY DAY *NEEDS APPT*  . magnesium oxide (MAG-OX) 400 MG tablet Take 400 mg by mouth daily.    . Multiple Vitamins-Minerals (OCUVITE ADULT 50+) CAPS Take 1 capsule by mouth daily.  Marland Kitchen olopatadine (PATANOL) 0.1 % ophthalmic solution Place 1 drop into the right eye 2 (two) times daily. (Patient taking differently: Place 1 drop into the right eye 2 (two) times daily as needed. )  . omeprazole (PRILOSEC) 40 MG capsule TAKE 1 CAPSULE BY MOUTH EVERY DAY *PT NEEDS APPT  . ondansetron (ZOFRAN) 8 MG tablet Take 1  tablet (8 mg total) by mouth every 6 (six) hours as needed for nausea or vomiting.  . pentosan polysulfate (ELMIRON) 100 MG capsule Take 100 mg by mouth 3 (three) times daily before meals.    . pravastatin (PRAVACHOL) 20 MG tablet Take 1 tablet (20 mg total) by mouth every evening.  . prazosin (MINIPRESS) 5 MG capsule TAKE 1 CAPSULE BY MOUTH TWICE A DAY  . Probiotic Product (PROBIOTIC PO) Take 1 capsule by mouth daily.   . [DISCONTINUED] azithromycin (ZITHROMAX) 250 MG tablet Take as directed (Patient not taking: Reported on 10/14/2018)   No facility-administered encounter medications on file as of 12/08/2018.     Activities of Daily Living In your present state of health, do you have any difficulty performing the following activities: 12/09/2018  Hearing? N  Vision? N  Difficulty concentrating or making decisions? N  Walking or climbing stairs? N  Dressing or bathing? N  Doing errands, shopping? N  Preparing Food and eating ? N  Using the Toilet? N  In the past six months, have you accidently leaked urine? N  Do you have problems with loss of bowel control? Y  Comment recent diarrhea, following up with Dr. Fuller Plan in GI soon.  Managing your Medications? N  Managing your Finances? N  Housekeeping or managing your Housekeeping? N  Some recent data might be hidden    Patient Care Team: Lucretia Kern, DO as PCP - General (Family Medicine) Meda Coffee,  Jamse Belfast, MD as Consulting Physician (Cardiology) Ladene Artist, MD as Consulting Physician (Gastroenterology) Festus Aloe, MD as Consulting Physician (Urology) [name], Darrick Grinder, MD as Consulting Physician (Allergy and Immunology) Monna Fam, MD as Consulting Physician (Ophthalmology) [name], MD (Dermatology) [name], MD as Consulting Physician (Orthopedic Surgery)    Assessment:   This is a routine wellness examination for Brianna Mccarty. Physical assessment deferred to PCP.   Exercise Activities and Dietary  recommendations Current Exercise Habits: Home exercise routine, Type of exercise: treadmill;walking, Time (Minutes): 30, Frequency (Times/Week): 3, Weekly Exercise (Minutes/Week): 90, Intensity: Moderate, Exercise limited by: cardiac condition(s) Diet (meal preparation, eat out, water intake, caffeinated beverages, dairy products, fruits and vegetables): in general, a "healthy" diet  , on average, 2 meals per day. Wants to incorporate more vegetables into diet, although recently has been having issues with any kind of ruffage, frequent diarrhea, constipation, and nausea. Recent weight loss noted d/t pt's decreased appetite and diarrhea. Pt. Currently taking zofran when nausea hits, as well as increasing miralax to bid per PCP instructions.Plans to see GI soon, Dr. Maudie Mercury aware.      Goals    . Exercise 150 min/wk Moderate Activity     More exercise and lose more weight Will cut calories after the holidays   Check out  online nutrition programs as GumSearch.nl and http://vang.com/; fit56me; Look for foods with "whole" wheat; bran; oatmeal etc Shot at the farmer's markets in season for fresher choices  Watch for "hydrogenated" on the label of oils which are trans-fats.  Watch for "high fructose corn syrup" in snacks, yogurt or ketchup  Meats have less marbling; bright colored fruits and vegetables;  Canned; dump out liquid and wash vegetables. Be mindful of what we are eating  Portion control is essential to a health weight! Sit down; take a break and enjoy your meal; take smaller bites; put the fork down between bites;  It takes 20 minutes to get full; so check in with your fullness cues and stop eating when you start to fill full           . Exercise 150 minutes per week (moderate activity)     Do more walking;  Will develop piliates routine   Stretching and deep breathing      . Patient Stated     Maintain exercise routine, and eat healthier by eating more vegetables, especially  once GI symptoms improve.        Fall Risk Fall Risk  12/09/2018 10/22/2017 10/22/2017 07/05/2016 07/03/2016  Falls in the past year? 0 No No Yes No  Number falls in past yr: - - - 1 -  Injury with Fall? - - - - -  Follow up - - - Education provided -    Depression Screen PHQ 2/9 Scores 12/09/2018 10/22/2017 07/03/2016 11/27/2015  PHQ - 2 Score 0 0 0 0  PHQ- 9 Score 0 - - -     Cognitive Function MMSE - Mini Mental State Exam 10/22/2017 07/05/2016  Not completed: (No Data) (No Data)       Ad8 score reviewed for issues:  Issues making decisions: no  Less interest in hobbies / activities: no  Repeats questions, stories (family complaining): no  Trouble using ordinary gadgets (microwave, computer, phone):no  Forgets the month or year: no  Mismanaging finances: no  Remembering appts: no  Daily problems with thinking and/or memory: no Ad8 score is= 0   Pt. Has not noticed any memory deficits, although  notes that her sister displays signs of dementia. Pt. Struggles to help take care of her sister in this regard. Memory cafe resource provided, and tips provided to help pt. Deal with sister's aggression, loss of interest in life, and frequent forgetfulness/getting lost.  Immunization History  Administered Date(s) Administered  . Influenza Split 09/18/2011, 08/11/2012, 07/28/2014  . Influenza Whole 07/21/2008, 08/04/2009, 08/01/2010  . Influenza, High Dose Seasonal PF 07/19/2013, 07/28/2015, 09/01/2017, 08/04/2018  . Influenza,inj,Quad PF,6+ Mos 07/03/2016  . Pneumococcal Conjugate-13 12/06/2013  . Pneumococcal Polysaccharide-23 10/03/2008  . Tdap 11/04/2008, 10/24/2015  . Zoster 11/05/2003    Screening Tests Health Maintenance  Topic Date Due  . COLONOSCOPY  02/07/2020  . TETANUS/TDAP  10/23/2025  . INFLUENZA VACCINE  Completed  . DEXA SCAN  Completed  . PNA vac Low Risk Adult  Completed        Plan:    Bone density scan order placed; [facility] should be  contacting you to schedule for march/april timeframe.  Bring sister in so that DPR form can be filled out with your assistance.   Bring a copy of your living will and/or healthcare power of attorney to your next office visit.  Schedule your physical with Dr. Maudie Mercury in 3 months, and schedule your AWV for next February.  Follow-up with Dr. Fuller Plan as planned. Let us know if we can help you in the meantime.  Refer to memory and caregiver resources provided to you. I have personally reviewed and noted the following in the patient's chart:   . Medical and social history . Use of alcohol, tobacco or illicit drugs  . Current medications and supplements . Functional ability and status . Nutritional status . Physical activity . Advanced directives . List of other physicians . Vitals . Screenings to include cognitive, depression, and falls . Referrals and appointments  In addition, I have reviewed and discussed with patient certain preventive protocols, quality metrics, and best practice recommendations. A written personalized care plan for preventive services as well as general preventive health recommendations were provided to patient.     Alphia Moh, RN  12/09/2018

## 2018-12-08 NOTE — Patient Instructions (Addendum)
Bone density scan order placed; [facility] should be contacting you to schedule for march/april timeframe.  DPR and living will forms provided for you and your family. Please let us know if we can assist you further in getting these completed for [place] to scan into our chart.  Schedule your physical with Dr. Maudie Mercury in 3 months, and schedule your AWV for next February.  Follow-up with Dr. Fuller Plan as planned. Let us know if we can help you in the meantime.  Refer to memory and caregiver resources provided to you.   Brianna Mccarty , Thank you for taking time to come for your Medicare Wellness Visit. I appreciate your ongoing commitment to your health goals. Please review the following plan we discussed and let me know if I can assist you in the future.   These are the goals we discussed: Goals    . Exercise 150 min/wk Moderate Activity     More exercise and lose more weight Will cut calories after the holidays   Check out  online nutrition programs as GumSearch.nl and http://vang.com/; fit46m; Look for foods with "whole" wheat; bran; oatmeal etc Shot at the farmer's markets in season for fresher choices  Watch for "hydrogenated" on the label of oils which are trans-fats.  Watch for "high fructose corn syrup" in snacks, yogurt or ketchup  Meats have less marbling; bright colored fruits and vegetables;  Canned; dump out liquid and wash vegetables. Be mindful of what we are eating  Portion control is essential to a health weight! Sit down; take a break and enjoy your meal; take smaller bites; put the fork down between bites;  It takes 20 minutes to get full; so check in with your fullness cues and stop eating when you start to fill full           . Exercise 150 minutes per week (moderate activity)     Do more walking;  Will develop piliates routine   Stretching and deep breathing      . Patient Stated     Maintain exercise routine, and eat healthier by eating more vegetables,  especially once GI symptoms improve.        This is a list of the screening recommended for you and due dates:  Health Maintenance  Topic Date Due  . Colon Cancer Screening  02/07/2020  . Tetanus Vaccine  10/23/2025  . Flu Shot  Completed  . DEXA scan (bone density measurement)  Completed  . Pneumonia vaccines  Completed    Health Maintenance, Female Adopting a healthy lifestyle and getting preventive care can go a long way to promote health and wellness. Talk with your health care provider about what schedule of regular examinations is right for you. This is a good chance for you to check in with your provider about disease prevention and staying healthy. In between checkups, there are plenty of things you can do on your own. Experts have done a lot of research about which lifestyle changes and preventive measures are most likely to keep you healthy. Ask your health care provider for more information. Weight and diet Eat a healthy diet  Be sure to include plenty of vegetables, fruits, low-fat dairy products, and lean protein.  Do not eat a lot of foods high in solid fats, added sugars, or salt.  Get regular exercise. This is one of the most important things you can do for your health. ? Most adults should exercise for at least 150 minutes each week.  The exercise should increase your heart rate and make you sweat (moderate-intensity exercise). ? Most adults should also do strengthening exercises at least twice a week. This is in addition to the moderate-intensity exercise. Maintain a healthy weight  Body mass index (BMI) is a measurement that can be used to identify possible weight problems. It estimates body fat based on height and weight. Your health care provider can help determine your BMI and help you achieve or maintain a healthy weight.  For females 52 years of age and older: ? A BMI below 18.5 is considered underweight. ? A BMI of 18.5 to 24.9 is normal. ? A BMI of 25 to  29.9 is considered overweight. ? A BMI of 30 and above is considered obese. Watch levels of cholesterol and blood lipids  You should start having your blood tested for lipids and cholesterol at 79 years of age, then have this test every 5 years.  You may need to have your cholesterol levels checked more often if: ? Your lipid or cholesterol levels are high. ? You are older than 79 years of age. ? You are at high risk for heart disease. Cancer screening Lung Cancer  Lung cancer screening is recommended for adults 77-38 years old who are at high risk for lung cancer because of a history of smoking.  A yearly low-dose CT scan of the lungs is recommended for people who: ? Currently smoke. ? Have quit within the past 15 years. ? Have at least a 30-pack-year history of smoking. A pack year is smoking an average of one pack of cigarettes a day for 1 year.  Yearly screening should continue until it has been 15 years since you quit.  Yearly screening should stop if you develop a health problem that would prevent you from having lung cancer treatment. Breast Cancer  Practice breast self-awareness. This means understanding how your breasts normally appear and feel.  It also means doing regular breast self-exams. Let your health care provider know about any changes, no matter how small.  If you are in your 20s or 30s, you should have a clinical breast exam (CBE) by a health care provider every 1-3 years as part of a regular health exam.  If you are 26 or older, have a CBE every year. Also consider having a breast X-ray (mammogram) every year.  If you have a family history of breast cancer, talk to your health care provider about genetic screening.  If you are at high risk for breast cancer, talk to your health care provider about having an MRI and a mammogram every year.  Breast cancer gene (BRCA) assessment is recommended for women who have family members with BRCA-related cancers.  BRCA-related cancers include: ? Breast. ? Ovarian. ? Tubal. ? Peritoneal cancers.  Results of the assessment will determine the need for genetic counseling and BRCA1 and BRCA2 testing. Cervical Cancer Your health care provider may recommend that you be screened regularly for cancer of the pelvic organs (ovaries, uterus, and vagina). This screening involves a pelvic examination, including checking for microscopic changes to the surface of your cervix (Pap test). You may be encouraged to have this screening done every 3 years, beginning at age 39.  For women ages 34-65, health care providers may recommend pelvic exams and Pap testing every 3 years, or they may recommend the Pap and pelvic exam, combined with testing for human papilloma virus (HPV), every 5 years. Some types of HPV increase your risk of cervical cancer.  Testing for HPV may also be done on women of any age with unclear Pap test results.  Other health care providers may not recommend any screening for nonpregnant women who are considered low risk for pelvic cancer and who do not have symptoms. Ask your health care provider if a screening pelvic exam is right for you.  If you have had past treatment for cervical cancer or a condition that could lead to cancer, you need Pap tests and screening for cancer for at least 20 years after your treatment. If Pap tests have been discontinued, your risk factors (such as having a new sexual partner) need to be reassessed to determine if screening should resume. Some women have medical problems that increase the chance of getting cervical cancer. In these cases, your health care provider may recommend more frequent screening and Pap tests. Colorectal Cancer  This type of cancer can be detected and often prevented.  Routine colorectal cancer screening usually begins at 79 years of age and continues through 79 years of age.  Your health care provider may recommend screening at an earlier age if you  have risk factors for colon cancer.  Your health care provider may also recommend using home test kits to check for hidden blood in the stool.  A small camera at the end of a tube can be used to examine your colon directly (sigmoidoscopy or colonoscopy). This is done to check for the earliest forms of colorectal cancer.  Routine screening usually begins at age 78.  Direct examination of the colon should be repeated every 5-10 years through 79 years of age. However, you may need to be screened more often if early forms of precancerous polyps or small growths are found. Skin Cancer  Check your skin from head to toe regularly.  Tell your health care provider about any new moles or changes in moles, especially if there is a change in a mole's shape or color.  Also tell your health care provider if you have a mole that is larger than the size of a pencil eraser.  Always use sunscreen. Apply sunscreen liberally and repeatedly throughout the day.  Protect yourself by wearing long sleeves, pants, a wide-brimmed hat, and sunglasses whenever you are outside. Heart disease, diabetes, and high blood pressure  High blood pressure causes heart disease and increases the risk of stroke. High blood pressure is more likely to develop in: ? People who have blood pressure in the high end of the normal range (130-139/85-89 mm Hg). ? People who are overweight or obese. ? People who are African American.  If you are 66-2 years of age, have your blood pressure checked every 3-5 years. If you are 7 years of age or older, have your blood pressure checked every year. You should have your blood pressure measured twice-once when you are at a hospital or clinic, and once when you are not at a hospital or clinic. Record the average of the two measurements. To check your blood pressure when you are not at a hospital or clinic, you can use: ? An automated blood pressure machine at a pharmacy. ? A home blood pressure  monitor.  If you are between 76 years and 10 years old, ask your health care provider if you should take aspirin to prevent strokes.  Have regular diabetes screenings. This involves taking a blood sample to check your fasting blood sugar level. ? If you are at a normal weight and have a low risk for diabetes,  have this test once every three years after 79 years of age. ? If you are overweight and have a high risk for diabetes, consider being tested at a younger age or more often. Preventing infection Hepatitis B  If you have a higher risk for hepatitis B, you should be screened for this virus. You are considered at high risk for hepatitis B if: ? You were born in a country where hepatitis B is common. Ask your health care provider which countries are considered high risk. ? Your parents were born in a high-risk country, and you have not been immunized against hepatitis B (hepatitis B vaccine). ? You have HIV or AIDS. ? You use needles to inject street drugs. ? You live with someone who has hepatitis B. ? You have had sex with someone who has hepatitis B. ? You get hemodialysis treatment. ? You take certain medicines for conditions, including cancer, organ transplantation, and autoimmune conditions. Hepatitis C  Blood testing is recommended for: ? Everyone born from [birth year] through 1966. ? Anyone with known risk factors for hepatitis C. Sexually transmitted infections (STIs)  You should be screened for sexually transmitted infections (STIs) including gonorrhea and chlamydia if: ? You are sexually active and are younger than 79 years of age. ? You are older than 79 years of age and your health care provider tells you that you are at risk for this type of infection. ? Your sexual activity has changed since you were last screened and you are at an increased risk for chlamydia or gonorrhea. Ask your health care provider if you are at risk.  If you do not have HIV, but are at risk, it may be  recommended that you take a prescription medicine daily to prevent HIV infection. This is called pre-exposure prophylaxis (PrEP). You are considered at risk if: ? You are sexually active and do not regularly use condoms or know the HIV status of your partner(s). ? You take drugs by injection. ? You are sexually active with a partner who has HIV. Talk with your health care provider about whether you are at high risk of being infected with HIV. If you choose to begin PrEP, you should first be tested for HIV. You should then be tested every 3 months for as long as you are taking PrEP. Pregnancy  If you are premenopausal and you may become pregnant, ask your health care provider about preconception counseling.  If you may become pregnant, take 400 to 800 micrograms (mcg) of folic acid every day.  If you want to prevent pregnancy, talk to your health care provider about birth control (contraception). Osteoporosis and menopause  Osteoporosis is a disease in which the bones lose minerals and strength with aging. This can result in serious bone fractures. Your risk for osteoporosis can be identified using a bone density scan.  If you are 15 years of age or older, or if you are at risk for osteoporosis and fractures, ask your health care provider if you should be screened.  Ask your health care provider whether you should take a calcium or vitamin D supplement to lower your risk for osteoporosis.  Menopause may have certain physical symptoms and risks.  Hormone replacement therapy may reduce some of these symptoms and risks. Talk to your health care provider about whether hormone replacement therapy is right for you. Follow these instructions at home:  Schedule regular health, dental, and eye exams.  Stay current with your immunizations.  Do not use any  tobacco products including cigarettes, chewing tobacco, or electronic cigarettes.  If you are pregnant, do not drink alcohol.  If you are  breastfeeding, limit how much and how often you drink alcohol.  Limit alcohol intake to no more than 1 drink per day for nonpregnant women. One drink equals 12 ounces of beer, 5 ounces of wine, or 1 ounces of hard liquor.  Do not use street drugs.  Do not share needles.  Ask your health care provider for help if you need support or information about quitting drugs.  Tell your health care provider if you often feel depressed.  Tell your health care provider if you have ever been abused or do not feel safe at home. This information is not intended to replace advice given to you by your health care provider. Make sure you discuss any questions you have with your health care provider. Document Released: 05/06/2011 Document Revised: 03/28/2016 Document Reviewed: 07/25/2015 Elsevier Interactive Patient Education  2019 [employer].

## 2018-12-08 NOTE — Progress Notes (Signed)
HPI:  Using dictation device. Unfortunately this device frequently misinterprets words/phrases.  Brianna Mccarty is a pleasant 78 y.o. here for follow up. Chronic medical problems summarized below were reviewed for changes and stability and were updated as needed below. These issues and their treatment remain stable for the most part.  Some nausea intermittently and constipation for several months. She is seeing Dr. Fuller Plan in GI for this and has appointment later this week. Aware needs MRI again in 6 months. No fevers, malaise, wt loss, vomiting, pain. FH GI cancer.Denies CP, SOB, DOE, treatment intolerance or new symptoms.   AWV 10/22/17  Obesity/hyperglycemia: -see hpi, has cut back on sweets  OSA: -seeing cardiologist for managment -mod-severe on study with cardiologist in 2018, CPAP titration pending  Anxiety and Depression: -much better as daughter is now doing better -resolved  HTN/Carotid Art Dz/HLD/venous insufficiency: -sees cardiologist -meds: norvasc, hydralazine, hctz, losartan, prazosin, asa, pravastatin, considering pcsk9 per cardiology lab notes  Asthma/Allergies: -meds: alb prn, azelastine nasal, Symbicort, allegra  GERD: -meds: prilosec 40mg   Osteoporosis: -on fosamax in the past, nowon holiday per her preference since 2017  Melanoma: -sees dermatologist for management  IC: -sees urologist for management -meds: desmopressin, elmiron ROS: See pertinent positives and negatives per HPI.   ROS: See pertinent positives and negatives per HPI.  Past Medical History:  Diagnosis Date  . Abnormal EKG    Normal LV function in the past  . Allergic rhinitis   . Asthma   . Bronchitis, mucopurulent recurrent ([place])   . Carotid artery disease ([place])    Doppler, April, 2009, 0-39% bilateral  . Cervical dysplasia 1971  . Chest pain, unspecified   . Diverticulosis of colon   . DJD (degenerative joint disease)   . Ejection fraction    EF 60%, echo,  2009  . GERD (gastroesophageal reflux disease)   . Headache(784.0)   . Hypercholesterolemia   . Hypertension   . Interstitial cystitis    sees urologist  . Lichen sclerosus   . Malignant melanoma ([place])    sees Dr. [place] Crane in dermatology  . Migraines   . Mitral valve disease    Question mitral valve prolapse in the past, no prolapse by echo 2009  . Murmur 10/20/2015  . Osteoporosis    on fosomax > 5 years, stopped 11/2015  . Renal calculus    sees urologist  . Thyroid cyst    1 x 1.1 thyroid cyst noted on carotid Doppler, January, 2012  . Venous insufficiency     Past Surgical History:  Procedure Laterality Date  . BREAST BIOPSY Left 11/25/2011   U/S core, benign performed at [facility]  . BREAST CYST ASPIRATION    . BREAST SURGERY  2013   Breast Bx-Benign  . CARDIAC CATHETERIZATION    . CATARACT EXTRACTION, BILATERAL    . cystoscopy and basket stone removal right ureter  02/2006   Dr. Terance Hart  . GYNECOLOGIC CRYOSURGERY  1971  . left total hip replacement  2004   Dr. Gladstone Lighter  . melanoma removed from medial rleft knee area  2006   Dr. [place] Crane  . NASAL SEPTUM SURGERY      Family History  Problem Relation Age of Onset  . Cancer Father        Pancreatic  . Diabetes Mother   . Heart disease Mother   . Cancer Brother        Bile duct  . Diabetes Brother   . Hypertension Brother   . Diabetes Brother   .  Heart disease Brother   . Hypertension Brother   . Hypertension Sister   . Hypertension Sister   . Colon cancer Paternal Uncle     SOCIAL HX: see hpi   Current Outpatient Medications:  .  albuterol (PROAIR HFA) 108 (90 Base) MCG/ACT inhaler, Inhale 2 puffs into the lungs every 6 (six) hours as needed for wheezing., Disp: 3 Inhaler, Rfl: 3 .  albuterol (PROVENTIL) (2.5 MG/3ML) 0.083% nebulizer solution, Take 3 mLs (2.5 mg total) by nebulization every 6 (six) hours as needed., Disp: 360 mL, Rfl: 3 .  amLODipine (NORVASC) 10 MG tablet, TAKE 1 TABLET BY MOUTH EVERY DAY, Disp:  90 tablet, Rfl: 1 .  Ascorbic Acid (VITAMIN C) 500 MG tablet, Take 500 mg by mouth daily.  , Disp: , Rfl:  .  aspirin 81 MG tablet, Take 81 mg by mouth daily.  , Disp: , Rfl:  .  azelastine (ASTELIN) 0.1 % nasal spray, Instill 2 sprays into each nostril two times a day as needed for allergies, Disp: , Rfl: 5 .  budesonide-formoterol (SYMBICORT) 160-4.5 MCG/ACT inhaler, Inhale 2 puffs 2 (two) times daily into the lungs., Disp: 3 Inhaler, Rfl: 3 .  Calcium Carbonate (CALCIUM 600) 1500 MG TABS, Take 1 tablet by mouth daily.  , Disp: , Rfl:  .  Cholecalciferol (VITAMIN D3) 5000 UNITS CAPS, Take 5,000 Units by mouth daily. , Disp: , Rfl:  .  Cinnamon 500 MG TABS, Take 1 tablet by mouth daily. , Disp: , Rfl:  .  Coenzyme Q10 (CO Q-10) 200 MG CAPS, Take 200 mg by mouth daily. (Patient taking differently: Take 200 mg by mouth 2 (two) times daily. ), Disp: 30 each, Rfl: 0 .  desmopressin (DDAVP) 0.2 MG tablet, Take 0.6 mg by mouth at bedtime. , Disp: , Rfl:  .  fexofenadine (ALLEGRA) 180 MG tablet, Take 180 mg by mouth daily as needed for allergies or rhinitis., Disp: , Rfl:  .  furosemide (LASIX) 20 MG tablet, TAKE 1 TABLET BY MOUTH EVERY DAY AS NEEDED, Disp: 30 tablet, Rfl: 3 .  hydrALAZINE (APRESOLINE) 50 MG tablet, TAKE 1 TABLET BY MOUTH 3 TIMES A DAY AND TAKE EXTRA TABLET IF SBP>160, Disp: 360 tablet, Rfl: 3 .  hydrOXYzine (ATARAX/VISTARIL) 10 MG tablet, Take 10 mg by mouth daily., Disp: , Rfl:  .  losartan (COZAAR) 100 MG tablet, TAKE 1 TABLET BY MOUTH EVERY DAY *NEEDS APPT*, Disp: 90 tablet, Rfl: 1 .  magnesium oxide (MAG-OX) 400 MG tablet, Take 400 mg by mouth daily.  , Disp: , Rfl:  .  Multiple Vitamins-Minerals (OCUVITE ADULT 50+) CAPS, Take 1 capsule by mouth daily., Disp: , Rfl:  .  olopatadine (PATANOL) 0.1 % ophthalmic solution, Place 1 drop into the right eye 2 (two) times daily. (Patient taking differently: Place 1 drop into the right eye 2 (two) times daily as needed. ), Disp: 5 mL, Rfl:  1 .  omeprazole (PRILOSEC) 40 MG capsule, TAKE 1 CAPSULE BY MOUTH EVERY DAY *PT NEEDS APPT, Disp: 90 capsule, Rfl: 0 .  ondansetron (ZOFRAN) 8 MG tablet, Take 1 tablet (8 mg total) by mouth every 6 (six) hours as needed for nausea or vomiting., Disp: 60 tablet, Rfl: 2 .  pentosan polysulfate (ELMIRON) 100 MG capsule, Take 100 mg by mouth 3 (three) times daily before meals.  , Disp: , Rfl:  .  prazosin (MINIPRESS) 5 MG capsule, TAKE 1 CAPSULE BY MOUTH TWICE A DAY, Disp: 180 capsule, Rfl: 1 .  Probiotic Product (PROBIOTIC PO), Take 1 capsule by mouth daily. , Disp: , Rfl:  .  pravastatin (PRAVACHOL) 20 MG tablet, Take 1 tablet (20 mg total) by mouth every evening., Disp: 90 tablet, Rfl: 3  EXAM:  Vitals:   12/08/18 1310  BP: (!) 110/50  Pulse: 70  Temp: 97.8 F (36.6 C)  SpO2: 96%    Body mass index is 32.56 kg/m.  GENERAL: vitals reviewed and listed above, alert, oriented, appears well hydrated and in no acute distress  HEENT: atraumatic, conjunttiva clear, no obvious abnormalities on inspection of external nose and ears  NECK: no obvious masses on inspection  LUNGS: clear to auscultation bilaterally, no wheezes, rales or rhonchi, good air movement  CV: HRRR, no peripheral edema  ABD: BS+, soft, NTTP  MS: moves all extremities without noticeable abnormality  PSYCH: pleasant and cooperative, no obvious depression or anxiety  ASSESSMENT AND PLAN:  Discussed the following assessment and plan:  Nausea  Constipation, unspecified constipation type  Pancreatic cyst  Hyperglycemia  Essential hypertension  Obesity (BMI 30.0-34.9)  -advised gi evaluation about nausea, constipation and findings on MRI given FH - she sees them this week. In interim mirilax. -knows due for repeat MRI in 6 months from prior or sooner if GI feels warrented -lifestyle recs for wt reduction - healthy diet and exercise advised -follow up in 3-4 months for CPE/AWV - sooner as needed -Patient  advised to return or notify a doctor immediately if symptoms worsen or persist or new concerns arise.  Patient Instructions  BEFORE YOU LEAVE: -print lab results -follow up: AWV and CPE in 3 months  See the gastroenterologist as planned. Use the mirilax in the interim.   We recommend the following healthy lifestyle for LIFE: 1) Small portions. But, make sure to get regular (at least 3 per day), healthy meals and small healthy snacks if needed.  2) Eat a healthy clean diet.   TRY TO EAT: -at least 5-7 servings of low sugar, colorful, and nutrient rich vegetables per day (not corn, potatoes or bananas.) -berries are the best choice if you wish to eat fruit (only eat small amounts if trying to reduce weight)  -lean meets (fish, white meat of chicken or [place]) -vegan proteins for some meals - beans or tofu, whole grains, nuts and seeds -Replace bad fats with good fats - good fats include: fish, nuts and seeds, canola oil, olive oil -small amounts of low fat or non fat dairy -small amounts of100 % whole grains - check the lables -drink plenty of water  AVOID: -SUGAR, sweets, anything with added sugar, corn syrup or sweeteners - must read labels as even foods advertised as "healthy" often are loaded with sugar -if you must have a sweetener, small amounts of stevia may be best -sweetened beverages and artificially sweetened beverages -simple starches (rice, bread, potatoes, pasta, chips, etc - small amounts of 100% whole grains are ok) -red meat, pork, butter -fried foods, fast food, processed food, excessive dairy, eggs and coconut.  3)Get at least 150 minutes of sweaty aerobic exercise per week.  4)Reduce stress - consider counseling, meditation and relaxation to balance other aspects of your life.     Lucretia Kern, DO

## 2018-12-09 NOTE — Progress Notes (Signed)
Brianna Coto [name], DO  

## 2018-12-11 ENCOUNTER — Encounter: Payer: Self-pay | Admitting: Gastroenterology

## 2018-12-11 ENCOUNTER — Ambulatory Visit (INDEPENDENT_AMBULATORY_CARE_PROVIDER_SITE_OTHER): Payer: PPO | Admitting: Gastroenterology

## 2018-12-11 VITALS — BP 140/72 | HR 74 | Ht 62.0 in | Wt 187.2 lb

## 2018-12-11 DIAGNOSIS — K5900 Constipation, unspecified: Secondary | ICD-10-CM | POA: Diagnosis not present

## 2018-12-11 DIAGNOSIS — R935 Abnormal findings on diagnostic imaging of other abdominal regions, including retroperitoneum: Secondary | ICD-10-CM

## 2018-12-11 DIAGNOSIS — R140 Abdominal distension (gaseous): Secondary | ICD-10-CM | POA: Diagnosis not present

## 2018-12-11 DIAGNOSIS — K219 Gastro-esophageal reflux disease without esophagitis: Secondary | ICD-10-CM

## 2018-12-11 DIAGNOSIS — K862 Cyst of pancreas: Secondary | ICD-10-CM | POA: Diagnosis not present

## 2018-12-11 DIAGNOSIS — K625 Hemorrhage of anus and rectum: Secondary | ICD-10-CM | POA: Diagnosis not present

## 2018-12-11 DIAGNOSIS — R1011 Right upper quadrant pain: Secondary | ICD-10-CM

## 2018-12-11 MED ORDER — DICYCLOMINE HCL 10 MG PO CAPS: 10.0000 mg | ORAL_CAPSULE | Freq: Three times a day (TID) | ORAL | 11 refills | Status: DC | PRN

## 2018-12-11 NOTE — Patient Instructions (Addendum)
We have sent the following medications to your pharmacy for you to pick up at your convenience: dicyclomine.   Start Benefiber or Citracel clear daily for constipation.   If this regimen helps your bowels then you can stop Miralax.   We will contact you after Dr. Fuller Plan has reviewed your MRI with one of his partners in the practice.  Thank you for choosing me and [facility].  Pricilla Riffle. Dagoberto Ligas., MD., Marval Regal

## 2018-12-11 NOTE — Progress Notes (Signed)
History of Present Illness: This is a 78 year old female referred by Lucretia Kern, DO for the evaluation of nausea, constipation, right upper quadrant/right chest pain, GERD, pancreatic cysts on MRI.  She has right upper quadrant right lateral chest pain that radiates to the back.  Symptoms are intermittent and not related to meals.  She has worsening constipation with incomplete evacuation, generalized abdominal bloating and generalized abdominal discomfort for the past 3 months.  MiraLAX and Colace have led to softer stools however incomplete evacuation, abdominal bloating and abdominal discomfort persists.  She also relates intermittent nausea which generally correlates with her other abdominal complaints.  Her reflux symptoms are well controlled on omeprazole and she has concerns about remaining on omeprazole.  She relates rare episodes of small-volume rectal bleeding with bowel movements.  Last colonoscopy performed in April 2016 showed mild diverticulosis.  The report does not mention internal hemorrhoids however after review of the retroflexed image it does appear she has small internal hemorrhoids. Denies weight loss, diarrhea, change in stool caliber, melena,  vomiting, dysphagia.   MRI abdomen w / wo contrast 11/20/2018 1. There are numerous cystic pancreatic lesions, some of which are minimally complicated. No solid components or suspicious enhancement demonstrated. Largest lesion in the pancreatic tail measures up to 19 mm in diameter. These are not clearly seen on previous noncontrast CT from 2012 and may be postinflammatory or reflect indolent cystic neoplasms. Per consensus guidelines, follow-up MRI in 6 months recommended to assess stability. This recommendation follows ACR consensus guidelines: Management of Incidental Pancreatic Cysts: A White Paper of the ACR Incidental Findings Committee. [place] 5465;03:[id number]. 2. No acute abdominal findings. 3. Small gallbladder  polyps.   Allergies  Allergen Reactions  . Cephalexin     Reaction was a high fever  . Penicillins Hives and Swelling    Swelling of arms & face Has patient had a PCN reaction causing immediate rash, facial/tongue/throat swelling, SOB or lightheadedness with hypotension: Yes Has patient had a PCN reaction causing severe rash involving mucus membranes or skin necrosis: No Has patient had a PCN reaction that required hospitalization: No Has patient had a PCN reaction occurring within the last 10 years: No If all of the above answers are "NO", then may proceed with Cephalosporin use.   Marland Kitchen Phenobarbital Hives  . Rosuvastatin Other (See Comments)    Reports causes lower extremity muscle aches  . Tape Rash   Outpatient Medications Prior to Visit  Medication Sig Dispense Refill  . albuterol (PROAIR HFA) 108 (90 Base) MCG/ACT inhaler Inhale 2 puffs into the lungs every 6 (six) hours as needed for wheezing. 3 Inhaler 3  . albuterol (PROVENTIL) (2.5 MG/3ML) 0.083% nebulizer solution Take 3 mLs (2.5 mg total) by nebulization every 6 (six) hours as needed. 360 mL 3  . amLODipine (NORVASC) 10 MG tablet TAKE 1 TABLET BY MOUTH EVERY DAY 90 tablet 1  . Ascorbic Acid (VITAMIN C) 500 MG tablet Take 500 mg by mouth daily.      Marland Kitchen aspirin 81 MG tablet Take 81 mg by mouth daily.      Marland Kitchen aspirin-acetaminophen-caffeine (EXCEDRIN MIGRAINE) 250-250-65 MG tablet Take by mouth every 6 (six) hours as needed for headache.    Marland Kitchen azelastine (ASTELIN) 0.1 % nasal spray Instill 2 sprays into each nostril two times a day as needed for allergies  5  . budesonide-formoterol (SYMBICORT) 160-4.5 MCG/ACT inhaler Inhale 2 puffs 2 (two) times daily into the lungs. 3  Inhaler 3  . Calcium Carbonate (CALCIUM 600) 1500 MG TABS Take 1 tablet by mouth daily.      . Cholecalciferol (VITAMIN D3) 5000 UNITS CAPS Take 5,000 Units by mouth daily.     . Cinnamon 500 MG TABS Take 1 tablet by mouth daily.     . Coenzyme Q10 (CO Q-10) 200 MG  CAPS Take 200 mg by mouth daily. (Patient taking differently: Take 200 mg by mouth 2 (two) times daily. ) 30 each 0  . desmopressin (DDAVP) 0.2 MG tablet Take 0.6 mg by mouth at bedtime.     . fexofenadine (ALLEGRA) 180 MG tablet Take 180 mg by mouth daily as needed for allergies or rhinitis.    . furosemide (LASIX) 20 MG tablet TAKE 1 TABLET BY MOUTH EVERY DAY AS NEEDED 30 tablet 3  . hydrALAZINE (APRESOLINE) 50 MG tablet TAKE 1 TABLET BY MOUTH 3 TIMES A DAY AND TAKE EXTRA TABLET IF SBP>160 360 tablet 3  . hydrOXYzine (ATARAX/VISTARIL) 10 MG tablet Take 10 mg by mouth daily.    Marland Kitchen losartan (COZAAR) 100 MG tablet TAKE 1 TABLET BY MOUTH EVERY DAY *NEEDS APPT* 90 tablet 1  . magnesium oxide (MAG-OX) 400 MG tablet Take 400 mg by mouth daily.      . Multiple Vitamins-Minerals (ICAPS AREDS 2 PO) Take by mouth daily.    Marland Kitchen olopatadine (PATANOL) 0.1 % ophthalmic solution Place 1 drop into the right eye 2 (two) times daily. (Patient taking differently: Place 1 drop into the right eye 2 (two) times daily as needed. ) 5 mL 1  . omeprazole (PRILOSEC) 40 MG capsule TAKE 1 CAPSULE BY MOUTH EVERY DAY *PT NEEDS APPT 90 capsule 0  . ondansetron (ZOFRAN) 8 MG tablet Take 1 tablet (8 mg total) by mouth every 6 (six) hours as needed for nausea or vomiting. 60 tablet 2  . pentosan polysulfate (ELMIRON) 100 MG capsule Take 100 mg by mouth 3 (three) times daily before meals.      . prazosin (MINIPRESS) 5 MG capsule TAKE 1 CAPSULE BY MOUTH TWICE A DAY 180 capsule 1  . Probiotic Product (PROBIOTIC PO) Take 1 capsule by mouth daily.     . Multiple Vitamins-Minerals (OCUVITE ADULT 50+) CAPS Take 1 capsule by mouth daily.    . pravastatin (PRAVACHOL) 20 MG tablet Take 1 tablet (20 mg total) by mouth every evening. 90 tablet 3   No facility-administered medications prior to visit.    Past Medical History:  Diagnosis Date  . Abnormal EKG    Normal LV function in the past  . Allergic rhinitis   . Asthma   . Bronchitis,  mucopurulent recurrent ([place])   . Carotid artery disease ([place])    Doppler, April, 2009, 0-39% bilateral  . Cervical dysplasia 1971  . Chest pain, unspecified   . Diverticulosis of colon   . DJD (degenerative joint disease)   . Ejection fraction    EF 60%, echo, 2009  . GERD (gastroesophageal reflux disease)   . Headache(784.0)   . Hypercholesterolemia   . Hypertension   . Interstitial cystitis    sees urologist  . Lichen sclerosus   . Malignant melanoma ([place])    sees Dr. [place] Crane in dermatology  . Migraines   . Mitral valve disease    Question mitral valve prolapse in the past, no prolapse by echo 2009  . Murmur 10/20/2015  . Osteoporosis    on fosomax > 5 years, stopped 11/2015  . Renal calculus  sees urologist  . Thyroid cyst    1 x 1.1 thyroid cyst noted on carotid Doppler, January, 2012  . Venous insufficiency    Past Surgical History:  Procedure Laterality Date  . BREAST BIOPSY Left 11/25/2011   U/S core, benign [facility]  . BREAST CYST ASPIRATION    . BREAST SURGERY  2013   Breast Bx-Benign  . CARDIAC CATHETERIZATION    . CATARACT EXTRACTION, BILATERAL    . cystoscopy and basket stone removal right ureter  02/2006   Dr. Terance Hart  . GYNECOLOGIC CRYOSURGERY  1971  . left total hip replacement  2004   Dr. Gladstone Lighter  . melanoma removed from medial rleft knee area  2006   Dr. Nevada Crane  . NASAL SEPTUM SURGERY     Social History   Socioeconomic History  . Marital status: Married    Spouse name: [place] Nasuti  . Number of children: 2  . Years of education: Not on file  . Highest education level: Not on file  Occupational History  . Occupation: retired    Fish farm manager: RETIRED  Social Needs  . Financial resource strain: Not hard at all  . Food insecurity:    Worry: Never true    Inability: Never true  . Transportation needs:    Medical: No    Non-medical: No  Tobacco Use  . Smoking status: Never Smoker  . Smokeless tobacco: Never Used  Substance and Sexual  Activity  . Alcohol use: Yes    Alcohol/week: 1.0 standard drinks    Types: 1 Standard drinks or equivalent per week    Comment: social use  . Drug use: No  . Sexual activity: Not Currently    Birth control/protection: Post-menopausal    Comment: 1st intercourse 70 yo-1 partner  Lifestyle  . Physical activity:    Days per week: 3 days    Minutes per session: 30 min  . Stress: To some extent  Relationships  . Social connections:    Talks on phone: More than three times a week    Gets together: Three times a week    Attends religious service: More than 4 times per year    Active member of club or organization: Yes    Attends meetings of clubs or organizations: More than 4 times per year    Relationship status: Married  Other Topics Concern  . Not on file  Social History Narrative   Updated 11/2015   Work or School: none      Home Situation: lives with husband      Spiritual Beliefs: christian      Lifestyle: regular exercise; healthy diet      12/08/2018: Uses treadmill 3/x weekly.    Looks after sister who has been having memory decline, as well  as husband with minor memory decline.    Enjoys writing poetry, reading, going to church, travel   Family History  Problem Relation Age of Onset  . Cancer Father        Pancreatic  . Diabetes Mother   . Heart disease Mother   . Cancer Brother        Bile duct  . Diabetes Brother   . Hypertension Brother   . Diabetes Brother   . Heart disease Brother   . Hypertension Brother   . Hypertension Sister   . Hypertension Sister   . Colon cancer Paternal Uncle       Review of Systems: Pertinent positive and negative review of systems were  noted in the above HPI section. All other review of systems were otherwise negative.   Physical Exam: General: Well developed, well nourished, no acute distress Head: Normocephalic and atraumatic Eyes:  sclerae anicteric, EOMI Ears: Normal auditory acuity Mouth: No deformity or  lesions Neck: Supple, no masses or thyromegaly Lungs: Clear throughout to auscultation Heart: Regular rate and rhythm; no murmurs, rubs or bruits Abdomen: Soft, non tender and non distended. No masses, hepatosplenomegaly or hernias noted. Normal Bowel sounds Rectal: Not done Musculoskeletal: Symmetrical with no gross deformities  Skin: No lesions on visible extremities Pulses:  Normal pulses noted Extremities: No clubbing, cyanosis, edema or deformities noted Neurological: Alert oriented x 4, grossly nonfocal Cervical Nodes:  No significant cervical adenopathy Inguinal Nodes: No significant inguinal adenopathy Psychological:  Alert and cooperative. Normal mood and affect   Assessment and Recommendations:  1. Multiple pancreatic cysts, at least 10 per MRI.  I reviewed the MRI report and images.  Multiple pancreatic cysts. some with minimally complicated features.  The lesions were not noted on a noncontrasted CT in 2012.  The largest cyst, which is in the pancreatic tail, measures 19 mm in greatest dimension.  Unlikely that they are symptomatic.  No prior history of pancreatitis.  No imaging findings to suggest chronic pancreatitis. R/O cystic neoplasm(s) versus benign cysts.  MRI follow-up in 3 to 6 months or further evaluation with EUS aspiration of larger/largest cyst.  Will further discuss mgmt options with Dr. Rush Landmark or Dr. Ardis Hughs. REV in 3 months.   2.  Abdominal bloating, abdominal discomfort, nausea, constipation incomplete evacuation.  Discontinue MiraLAX and begin Benefiber or Citrucel clear daily.  Maintain a high-fiber diet with adequate daily water intake.  Begin dicyclomine 10 mg 3 times daily as needed. REV in 3 months.   3.  Intermittent small-volume rectal bleeding likely due to hemorrhoids.  Preparation H suppositories daily as needed.  Appropriate management of constipation.  If symptoms persist consider further evaluation with repeat colonoscopy. REV in 3 months.   4.   Small gallbladder polyps, asymptomatic, require no further follow-up.  5.  Right upper quadrant and right chest pain radiating to the back. These symptoms do not appear to be from a gastrointestinal etiology.  Monitor symptoms.  We will see if they respond to measures outlined in #2.  May need further evaluation by PCP.  6. GERD.  Maintain standard antireflux measures and omeprazole 40 mg daily.  This regimen works well for her and when she tries to discontinue omeprazole she has a significant return symptoms.   cc: Lucretia Kern, DO [address] [place], [place] [address]

## 2018-12-15 DIAGNOSIS — D2271 Melanocytic nevi of right lower limb, including hip: Secondary | ICD-10-CM | POA: Diagnosis not present

## 2018-12-15 DIAGNOSIS — Z08 Encounter for follow-up examination after completed treatment for malignant neoplasm: Secondary | ICD-10-CM | POA: Diagnosis not present

## 2018-12-15 DIAGNOSIS — Z1283 Encounter for screening for malignant neoplasm of skin: Secondary | ICD-10-CM | POA: Diagnosis not present

## 2018-12-15 DIAGNOSIS — D225 Melanocytic nevi of trunk: Secondary | ICD-10-CM | POA: Diagnosis not present

## 2018-12-15 DIAGNOSIS — Z85820 Personal history of malignant melanoma of skin: Secondary | ICD-10-CM | POA: Diagnosis not present

## 2018-12-16 ENCOUNTER — Telehealth: Payer: Self-pay

## 2018-12-16 DIAGNOSIS — J301 Allergic rhinitis due to pollen: Secondary | ICD-10-CM | POA: Diagnosis not present

## 2018-12-16 DIAGNOSIS — J3089 Other allergic rhinitis: Secondary | ICD-10-CM | POA: Diagnosis not present

## 2018-12-16 NOTE — Telephone Encounter (Signed)
Patient notified of the recommendations.  She is aware I will contact her in 6 months to set up the MRI

## 2018-12-16 NOTE — Telephone Encounter (Signed)
Left message for patient to call back  

## 2018-12-16 NOTE — Telephone Encounter (Signed)
-----   Message from Ladene Artist, MD sent at 12/16/2018  7:36 AM EST ----- Please let patient know I reviewed her case with DJ who performs EUS and we recommend a repeat MRI pancreatic protocol in 6 months as radiology recommended.  ----- Message ----- From: Milus Banister, MD Sent: 12/16/2018   7:18 AM EST To: Ladene Artist, MD  Norberto Sorenson, sorry about the delay for getting back to you.  I think repeat surveillance imaging is reasonable given her advanced age, the fact that there are multiple cysts, the fact that she has no concerning features according to 2015 AGA guidelines on incidental pancreatic cysts.  I recommend repeat MRI in 1 year.  Thanks  ----- Message ----- From: Ladene Artist, MD Sent: 12/11/2018   5:34 PM EST To: Milus Banister, MD  Linna Hoff,   Would you please review this patients MRI and my office note. She has multiple recently diagnosed pancreatic cysts, the largest is in the tail and measures 19 mm.   MRI follow up in 3 to 6 months or EUS possible cyst aspiration?   Thanks,   Norberto Sorenson

## 2018-12-21 DIAGNOSIS — J301 Allergic rhinitis due to pollen: Secondary | ICD-10-CM | POA: Diagnosis not present

## 2018-12-21 DIAGNOSIS — J3089 Other allergic rhinitis: Secondary | ICD-10-CM | POA: Diagnosis not present

## 2018-12-23 DIAGNOSIS — J3089 Other allergic rhinitis: Secondary | ICD-10-CM | POA: Diagnosis not present

## 2018-12-23 DIAGNOSIS — J301 Allergic rhinitis due to pollen: Secondary | ICD-10-CM | POA: Diagnosis not present

## 2018-12-24 ENCOUNTER — Telehealth: Payer: Self-pay

## 2018-12-24 NOTE — Telephone Encounter (Signed)
EMMI education regarding balance exercises mailed to patient.  Copied from [place] [phone]. Topic: General - Other >> Dec 21, 2018  4:32 PM Sheran Luz wrote: Reason for CRM: Patient states that at [facility] on 2/4; she was supposed to have a letter containing exercises for balance that she should be doing sent to home. Patient states she has not received this letter and inquired if it could be sent back out. Please advise.

## 2018-12-29 DIAGNOSIS — J301 Allergic rhinitis due to pollen: Secondary | ICD-10-CM | POA: Diagnosis not present

## 2018-12-29 DIAGNOSIS — J3089 Other allergic rhinitis: Secondary | ICD-10-CM | POA: Diagnosis not present

## 2018-12-30 ENCOUNTER — Other Ambulatory Visit: Payer: Self-pay | Admitting: Family Medicine

## 2019-01-05 DIAGNOSIS — J301 Allergic rhinitis due to pollen: Secondary | ICD-10-CM | POA: Diagnosis not present

## 2019-01-05 DIAGNOSIS — J3089 Other allergic rhinitis: Secondary | ICD-10-CM | POA: Diagnosis not present

## 2019-01-11 DIAGNOSIS — J301 Allergic rhinitis due to pollen: Secondary | ICD-10-CM | POA: Diagnosis not present

## 2019-01-11 DIAGNOSIS — J3089 Other allergic rhinitis: Secondary | ICD-10-CM | POA: Diagnosis not present

## 2019-01-18 ENCOUNTER — Encounter: Payer: Self-pay | Admitting: Gynecology

## 2019-01-18 DIAGNOSIS — J301 Allergic rhinitis due to pollen: Secondary | ICD-10-CM | POA: Diagnosis not present

## 2019-01-18 DIAGNOSIS — Z1231 Encounter for screening mammogram for malignant neoplasm of breast: Secondary | ICD-10-CM | POA: Diagnosis not present

## 2019-01-18 DIAGNOSIS — J3089 Other allergic rhinitis: Secondary | ICD-10-CM | POA: Diagnosis not present

## 2019-01-21 ENCOUNTER — Other Ambulatory Visit: Payer: Self-pay | Admitting: Cardiology

## 2019-01-27 DIAGNOSIS — J301 Allergic rhinitis due to pollen: Secondary | ICD-10-CM | POA: Diagnosis not present

## 2019-01-27 DIAGNOSIS — J3089 Other allergic rhinitis: Secondary | ICD-10-CM | POA: Diagnosis not present

## 2019-02-01 ENCOUNTER — Other Ambulatory Visit: Payer: Self-pay | Admitting: Family Medicine

## 2019-02-01 DIAGNOSIS — J3089 Other allergic rhinitis: Secondary | ICD-10-CM | POA: Diagnosis not present

## 2019-02-01 DIAGNOSIS — J301 Allergic rhinitis due to pollen: Secondary | ICD-10-CM | POA: Diagnosis not present

## 2019-02-09 DIAGNOSIS — J301 Allergic rhinitis due to pollen: Secondary | ICD-10-CM | POA: Diagnosis not present

## 2019-02-09 DIAGNOSIS — J3089 Other allergic rhinitis: Secondary | ICD-10-CM | POA: Diagnosis not present

## 2019-02-11 DIAGNOSIS — J301 Allergic rhinitis due to pollen: Secondary | ICD-10-CM | POA: Diagnosis not present

## 2019-02-11 DIAGNOSIS — J3089 Other allergic rhinitis: Secondary | ICD-10-CM | POA: Diagnosis not present

## 2019-02-15 DIAGNOSIS — J3089 Other allergic rhinitis: Secondary | ICD-10-CM | POA: Diagnosis not present

## 2019-02-15 DIAGNOSIS — J301 Allergic rhinitis due to pollen: Secondary | ICD-10-CM | POA: Diagnosis not present

## 2019-02-17 ENCOUNTER — Encounter: Payer: Self-pay | Admitting: Family Medicine

## 2019-02-17 ENCOUNTER — Ambulatory Visit (INDEPENDENT_AMBULATORY_CARE_PROVIDER_SITE_OTHER): Payer: PPO | Admitting: Family Medicine

## 2019-02-17 ENCOUNTER — Other Ambulatory Visit: Payer: Self-pay

## 2019-02-17 DIAGNOSIS — M810 Age-related osteoporosis without current pathological fracture: Secondary | ICD-10-CM

## 2019-02-17 DIAGNOSIS — I10 Essential (primary) hypertension: Secondary | ICD-10-CM | POA: Diagnosis not present

## 2019-02-17 DIAGNOSIS — H3530 Unspecified macular degeneration: Secondary | ICD-10-CM

## 2019-02-17 DIAGNOSIS — E7800 Pure hypercholesterolemia, unspecified: Secondary | ICD-10-CM | POA: Diagnosis not present

## 2019-02-17 DIAGNOSIS — K219 Gastro-esophageal reflux disease without esophagitis: Secondary | ICD-10-CM

## 2019-02-17 DIAGNOSIS — J4530 Mild persistent asthma, uncomplicated: Secondary | ICD-10-CM | POA: Diagnosis not present

## 2019-02-17 DIAGNOSIS — J309 Allergic rhinitis, unspecified: Secondary | ICD-10-CM

## 2019-02-17 DIAGNOSIS — R739 Hyperglycemia, unspecified: Secondary | ICD-10-CM | POA: Diagnosis not present

## 2019-02-17 DIAGNOSIS — I739 Peripheral vascular disease, unspecified: Secondary | ICD-10-CM

## 2019-02-17 DIAGNOSIS — C439 Malignant melanoma of skin, unspecified: Secondary | ICD-10-CM | POA: Diagnosis not present

## 2019-02-17 DIAGNOSIS — I779 Disorder of arteries and arterioles, unspecified: Secondary | ICD-10-CM

## 2019-02-17 DIAGNOSIS — G4733 Obstructive sleep apnea (adult) (pediatric): Secondary | ICD-10-CM | POA: Diagnosis not present

## 2019-02-17 DIAGNOSIS — N3010 Interstitial cystitis (chronic) without hematuria: Secondary | ICD-10-CM

## 2019-02-17 DIAGNOSIS — I872 Venous insufficiency (chronic) (peripheral): Secondary | ICD-10-CM

## 2019-02-17 NOTE — Progress Notes (Signed)
Virtual Visit via Video Note  I connected with@ on 02/17/19 at  1:00 PM EDT by a video enabled telemedicine application and verified that I am speaking with the correct person using two identifiers.  Location patient: home Location provider:work or home office Persons participating in the virtual visit: patient, provider  I discussed the limitations of evaluation and management by telemedicine and the availability of in person appointments. The patient expressed understanding and agreed to proceed.   DECLYNN LOPRESTI DOB: April 20, 1941 Encounter date: 02/17/2019  This is a 78 y.o. female who presents to establish care. Chief Complaint  Patient presents with  . Transitions Of Care    History of present illness: Took bp medication at 10. When she checked bp: 145/116 HR 69; at 12:40 was 154/95 HR 84. Has been under a lot of stress with sister who is also [facility] patient. She is having a lot of memory issues. Last check a few weeks ago was 139/93. BP monitor quit and she got another one. BP just now on phone was 175/54.   HTN:on amlodipine, hydralazine,losartan, prazosin: she is good about taking her medication. She has had some headaches with allergy season being here and this affecting breathing.  Venous insufficiency:follows with cardiology; no current concerns. Carotid artery disease:follows with cardiology. Allergic rhinitis: on allegra, astelin. Asthma: on symbicort.  OSA: using dental device which is working well for her. Had recent sleep study and she wasn't quite at goal, but will be following up with them.  GERD: Recently saw Dr. Fuller Plan in February - they are monitoring multiple GI issues including pancreatic cysts, abdominal bloating/discomfort/constipation. Short term followup/repeat imaging scheduled by them in May. On omeprazole,zofran. States that she is doing better from GI symptom standpoint at this time. Dicyclomine has helped significantly. Has helped with constipation. Not having abd  discomfort she was having before, gas sensation is better. Has stopped the diclyclomine to monitor symptoms. Still taking the omeprazole. Not using the zofran. Melanoma: follows with Dr. Nevada Crane regularly.  Osteoporosis:not on treatment currently;  Interstitial cystitis: on Elmiron. Worries about internet readings that relate this to macular degeneration. Helped this tremendously.  Hyperlipidemia: on pravastatin 20mg  nightly Hyperglycemia: last A1C 08/2018 was 5.9 Back issues have been stable; does see ortho when needed. Spine arthritis, as well as hip and knee arthritis.  Tries to exercise three times/week.   Past Medical History:  Diagnosis Date  . Abnormal EKG    Normal LV function in the past  . Allergic rhinitis   . Asthma   . Bronchitis, mucopurulent recurrent ([place])   . Carotid artery disease ([place])    Doppler, April, 2009, 0-39% bilateral  . Cervical dysplasia 1971  . Chest pain, unspecified   . Diverticulosis of colon   . DJD (degenerative joint disease)   . Ejection fraction    EF 60%, echo, 2009  . GERD (gastroesophageal reflux disease)   . Headache(784.0)   . Hypercholesterolemia   . Hypertension   . Interstitial cystitis    sees urologist  . Lichen sclerosus   . Malignant melanoma ([place])    sees Dr. Nevada Crane in dermatology  . Migraines   . Mitral valve disease    Question mitral valve prolapse in the past, no prolapse by echo 2009  . Murmur 10/20/2015  . Osteoporosis    on fosomax > 5 years, stopped 11/2015  . Renal calculus    sees urologist  . Thyroid cyst    1 x 1.1 thyroid cyst noted on carotid  Doppler, January, 2012  . Venous insufficiency    Past Surgical History:  Procedure Laterality Date  . BREAST BIOPSY Left 11/25/2011   [facility] core[facility]  . BREAST CYST ASPIRATION    . BREAST SURGERY  2013   Breast Bx-Benign  . CARDIAC CATHETERIZATION    . CATARACT EXTRACTION, BILATERAL    . cystoscopy and basket stone removal right ureter  02/2006    Dr. Terance Hart  . GYNECOLOGIC CRYOSURGERY  1971  . left total hip replacement  2004   Dr. Gladstone Lighter  . melanoma removed from medial rleft knee area  2006   Dr. Nevada Crane  . NASAL SEPTUM SURGERY     Allergies  Allergen Reactions  . Cephalexin     Reaction was a high fever  . Penicillins Hives and Swelling    Swelling of arms & face Has patient had a PCN reaction causing immediate rash, facial/tongue/throat swelling, SOB or lightheadedness with hypotension: Yes Has patient had a PCN reaction causing severe rash involving mucus membranes or skin necrosis: No Has patient had a PCN reaction that required hospitalization: No Has patient had a PCN reaction occurring within the last 10 years: No If all of the above answers are "NO", then may proceed with Cephalosporin use.   Marland Kitchen Phenobarbital Hives  . Rosuvastatin Other (See Comments)    Reports causes lower extremity muscle aches  . Tape Rash   Current Meds  Medication Sig  . albuterol (PROAIR HFA) 108 (90 Base) MCG/ACT inhaler Inhale 2 puffs into the lungs every 6 (six) hours as needed for wheezing.  Marland Kitchen albuterol (PROVENTIL) (2.5 MG/3ML) 0.083% nebulizer solution Take 3 mLs (2.5 mg total) by nebulization every 6 (six) hours as needed.  Marland Kitchen amLODipine (NORVASC) 10 MG tablet TAKE 1 TABLET BY MOUTH EVERY DAY  . Ascorbic Acid (VITAMIN C) 500 MG tablet Take 500 mg by mouth daily.    Marland Kitchen aspirin 81 MG tablet Take 81 mg by mouth daily.    Marland Kitchen aspirin-acetaminophen-caffeine (EXCEDRIN MIGRAINE) 250-250-65 MG tablet Take by mouth every 6 (six) hours as needed for headache.  Marland Kitchen azelastine (ASTELIN) 0.1 % nasal spray Instill 2 sprays into each nostril two times a day as needed for allergies  . budesonide-formoterol (SYMBICORT) 160-4.5 MCG/ACT inhaler Inhale 2 puffs 2 (two) times daily into the lungs.  . Calcium Carbonate (CALCIUM 600) 1500 MG TABS Take 1 tablet by mouth daily.    . Cholecalciferol (VITAMIN D3) 5000 UNITS CAPS Take 5,000 Units by mouth daily.   .  Cinnamon 500 MG TABS Take 1 tablet by mouth daily.   . Coenzyme Q10 (CO Q-10) 200 MG CAPS Take 200 mg by mouth daily. (Patient taking differently: Take 200 mg by mouth 2 (two) times daily. )  . desmopressin (DDAVP) 0.2 MG tablet Take 0.6 mg by mouth at bedtime.   . fexofenadine (ALLEGRA) 180 MG tablet Take 180 mg by mouth daily as needed for allergies or rhinitis.  . furosemide (LASIX) 20 MG tablet TAKE 1 TABLET BY MOUTH EVERY DAY AS NEEDED  . hydrALAZINE (APRESOLINE) 50 MG tablet TAKE 1 TABLET BY MOUTH 3 TIMES A DAY AND TAKE EXTRA TABLET IF SBP>160 - pt needs to call and make appt for further refills  . hydrOXYzine (ATARAX/VISTARIL) 10 MG tablet Take 10 mg by mouth daily.  Marland Kitchen losartan (COZAAR) 100 MG tablet TAKE 1 TABLET BY MOUTH EVERY DAY *NEEDS APPT*  . magnesium oxide (MAG-OX) 400 MG tablet Take 400 mg by mouth daily.    Marland Kitchen  Multiple Vitamins-Minerals (ICAPS AREDS 2 PO) Take by mouth daily.  Marland Kitchen olopatadine (PATANOL) 0.1 % ophthalmic solution Place 1 drop into the right eye 2 (two) times daily. (Patient taking differently: Place 1 drop into the right eye 2 (two) times daily as needed. )  . omeprazole (PRILOSEC) 40 MG capsule TAKE 1 CAPSULE BY MOUTH EVERY DAY *PT NEEDS APPT  . pentosan polysulfate (ELMIRON) 100 MG capsule Take 100 mg by mouth 3 (three) times daily before meals.    . prazosin (MINIPRESS) 5 MG capsule TAKE 1 CAPSULE BY MOUTH TWICE A DAY  . Probiotic Product (PROBIOTIC PO) Take 1 capsule by mouth daily.    Social History   Tobacco Use  . Smoking status: Never Smoker  . Smokeless tobacco: Never Used  Substance Use Topics  . Alcohol use: Yes    Alcohol/week: 1.0 standard drinks    Types: 1 Standard drinks or equivalent per week    Comment: social use   Family History  Problem Relation Age of Onset  . Cancer Father        Pancreatic  . Diabetes Mother   . Heart disease Mother   . Cancer Brother        Bile duct  . Diabetes Brother   . Hypertension Brother   . Diabetes  Brother   . Heart disease Brother   . Hypertension Brother   . Hypertension Sister   . Hypertension Sister   . Colon cancer Paternal Uncle      Review of Systems  Constitutional: Negative for chills, fatigue and fever.  Respiratory: Negative for cough, chest tightness, shortness of breath and wheezing.   Cardiovascular: Negative for chest pain, palpitations and leg swelling.  Gastrointestinal: Negative for abdominal pain, constipation, diarrhea and nausea.    Objective:  There were no vitals taken for this visit.      BP Readings from Last 3 Encounters:  12/11/18 140/72  12/08/18 (!) 110/50  12/08/18 (!) 110/50   Wt Readings from Last 3 Encounters:  12/11/18 187 lb 4 oz (84.9 kg)  12/08/18 183 lb (83 kg)  12/08/18 183 lb 12.8 oz (83.4 kg)    EXAM:  GENERAL: alert, oriented, appears well and in no acute distress  HEENT: atraumatic, conjunctiva clear, no obvious abnormalities on inspection of external nose and ears  NECK: normal movements of the head and neck  LUNGS: on inspection no signs of respiratory distress, breathing rate appears normal, no obvious gross SOB, gasping or wheezing  CV: no obvious cyanosis  MS: moves all visible extremities without noticeable abnormality  PSYCH/NEURO: pleasant and cooperative, no obvious depression or anxiety, speech and thought processing grossly intact   Assessment/Plan  1. Essential hypertension She will continue to monitor blood pressure at home and bring in home cuff to her next visit which we will have her call and schedule in June to July.  She will let me know if blood pressure remains elevated on home checks.  2. Venous (peripheral) insufficiency Stable.  She does follow with cardiology regularly.  3. Bilateral carotid artery disease, unspecified type ([place]) Stable.  Follows with cardiology.  Continue to manage blood pressure and cholesterol.  4. Allergic rhinitis, unspecified seasonality, unspecified  trigger Continue use of nasal spray and Allegra; continue allergy injections.  5. Mild persistent asthma without complication Controlled.  She does follow with pulmonology.  On Symbicort.  6. OSA (obstructive sleep apnea) She is scheduled for follow-up with sleep specialist.  7. Gastroesophageal reflux disease without esophagitis  Continue with omeprazole.  8. Melanoma of skin ([place]) Follows with dermatology.  9. Osteoporosis, unspecified osteoporosis type, unspecified pathological fracture presence Taking vitamin D and calcium.  Density study was ordered, but patient was not contacted for completion of this.  We can reorder when she is in office in the summer, and risk of COVID-19 is decreased.  10. INTERSTITIAL CYSTITIS Symptoms are well controlled on Elmiron. She does follow with urology.  11. HYPERCHOLESTEROLEMIA On pravastatin. Stable.  12. Hyperglycemia Stable.  13. Macular degeneration of both eyes, unspecified type Following with ophtho.   Return 2 months, for Chronic condition visit.    I discussed the assessment and treatment plan with the patient. The patient was provided an opportunity to ask questions and all were answered. The patient agreed with the plan and demonstrated an understanding of the instructions.   The patient was advised to call back or seek an in-person evaluation if the symptoms worsen or if the condition fails to improve as anticipated.  Micheline Rough, MD

## 2019-02-25 DIAGNOSIS — J3089 Other allergic rhinitis: Secondary | ICD-10-CM | POA: Diagnosis not present

## 2019-02-25 DIAGNOSIS — J301 Allergic rhinitis due to pollen: Secondary | ICD-10-CM | POA: Diagnosis not present

## 2019-03-04 DIAGNOSIS — J3089 Other allergic rhinitis: Secondary | ICD-10-CM | POA: Diagnosis not present

## 2019-03-04 DIAGNOSIS — J301 Allergic rhinitis due to pollen: Secondary | ICD-10-CM | POA: Diagnosis not present

## 2019-03-12 DIAGNOSIS — J3089 Other allergic rhinitis: Secondary | ICD-10-CM | POA: Diagnosis not present

## 2019-03-12 DIAGNOSIS — J301 Allergic rhinitis due to pollen: Secondary | ICD-10-CM | POA: Diagnosis not present

## 2019-03-16 DIAGNOSIS — J301 Allergic rhinitis due to pollen: Secondary | ICD-10-CM | POA: Diagnosis not present

## 2019-03-16 DIAGNOSIS — J3089 Other allergic rhinitis: Secondary | ICD-10-CM | POA: Diagnosis not present

## 2019-03-17 ENCOUNTER — Encounter (INDEPENDENT_AMBULATORY_CARE_PROVIDER_SITE_OTHER): Payer: PPO | Admitting: Ophthalmology

## 2019-03-22 DIAGNOSIS — J301 Allergic rhinitis due to pollen: Secondary | ICD-10-CM | POA: Diagnosis not present

## 2019-03-22 DIAGNOSIS — J3089 Other allergic rhinitis: Secondary | ICD-10-CM | POA: Diagnosis not present

## 2019-03-28 ENCOUNTER — Other Ambulatory Visit: Payer: Self-pay | Admitting: Family Medicine

## 2019-04-01 DIAGNOSIS — J301 Allergic rhinitis due to pollen: Secondary | ICD-10-CM | POA: Diagnosis not present

## 2019-04-01 DIAGNOSIS — J3089 Other allergic rhinitis: Secondary | ICD-10-CM | POA: Diagnosis not present

## 2019-04-06 DIAGNOSIS — J301 Allergic rhinitis due to pollen: Secondary | ICD-10-CM | POA: Diagnosis not present

## 2019-04-06 DIAGNOSIS — J3089 Other allergic rhinitis: Secondary | ICD-10-CM | POA: Diagnosis not present

## 2019-04-07 ENCOUNTER — Other Ambulatory Visit: Payer: Self-pay

## 2019-04-07 ENCOUNTER — Encounter (INDEPENDENT_AMBULATORY_CARE_PROVIDER_SITE_OTHER): Payer: PPO | Admitting: Ophthalmology

## 2019-04-07 DIAGNOSIS — I10 Essential (primary) hypertension: Secondary | ICD-10-CM

## 2019-04-07 DIAGNOSIS — H35341 Macular cyst, hole, or pseudohole, right eye: Secondary | ICD-10-CM | POA: Diagnosis not present

## 2019-04-07 DIAGNOSIS — H35033 Hypertensive retinopathy, bilateral: Secondary | ICD-10-CM

## 2019-04-07 DIAGNOSIS — D3132 Benign neoplasm of left choroid: Secondary | ICD-10-CM

## 2019-04-07 DIAGNOSIS — H33303 Unspecified retinal break, bilateral: Secondary | ICD-10-CM | POA: Diagnosis not present

## 2019-04-07 DIAGNOSIS — H43813 Vitreous degeneration, bilateral: Secondary | ICD-10-CM | POA: Diagnosis not present

## 2019-04-13 DIAGNOSIS — H6123 Impacted cerumen, bilateral: Secondary | ICD-10-CM | POA: Diagnosis not present

## 2019-04-14 DIAGNOSIS — J301 Allergic rhinitis due to pollen: Secondary | ICD-10-CM | POA: Diagnosis not present

## 2019-04-14 DIAGNOSIS — J3089 Other allergic rhinitis: Secondary | ICD-10-CM | POA: Diagnosis not present

## 2019-04-15 ENCOUNTER — Other Ambulatory Visit: Payer: Self-pay | Admitting: Cardiovascular Disease

## 2019-04-29 DIAGNOSIS — J301 Allergic rhinitis due to pollen: Secondary | ICD-10-CM | POA: Diagnosis not present

## 2019-04-29 DIAGNOSIS — J3089 Other allergic rhinitis: Secondary | ICD-10-CM | POA: Diagnosis not present

## 2019-05-04 DIAGNOSIS — D1801 Hemangioma of skin and subcutaneous tissue: Secondary | ICD-10-CM | POA: Diagnosis not present

## 2019-05-06 ENCOUNTER — Other Ambulatory Visit: Payer: Self-pay | Admitting: Family Medicine

## 2019-05-07 ENCOUNTER — Telehealth: Payer: Self-pay | Admitting: *Deleted

## 2019-05-07 DIAGNOSIS — Z20822 Contact with and (suspected) exposure to covid-19: Secondary | ICD-10-CM

## 2019-05-07 NOTE — Telephone Encounter (Addendum)
Attempted to contact pt to schedule testing; left message on voicemail; orders placed per protocol. ----- Message from [place] sent at 05/06/2019  3:19 PM EDT ----- Regarding: Please schedule COVID testing per Dr. Colin Benton - Thank you!

## 2019-05-07 NOTE — Telephone Encounter (Addendum)
Pt called back to schedule.  However, she thought her sister had an order, who lives with her.  But she does not. Brianna Mccarty) Pt is going to wait for Dr Maudie Mercury to put in order for her sister before she schedules, because they are to come together.  Pt will call back Monday.  Pt aware [facility] sites for testing are closed today. And the dr office as well.

## 2019-05-07 NOTE — Telephone Encounter (Signed)
Patient scheduled for 05/10/2019 at 12:30pm.

## 2019-05-10 ENCOUNTER — Other Ambulatory Visit: Payer: PPO

## 2019-05-10 DIAGNOSIS — Z20822 Contact with and (suspected) exposure to covid-19: Secondary | ICD-10-CM

## 2019-05-10 DIAGNOSIS — R6889 Other general symptoms and signs: Secondary | ICD-10-CM | POA: Diagnosis not present

## 2019-05-11 NOTE — Telephone Encounter (Signed)
Patient checking on the status of omeprazole (PRILOSEC) 40 MG capsule refill request, stating her acid refux has flared up  [id number] [address], [place]. [phone] (Phone) [phone] (Fax)

## 2019-05-12 ENCOUNTER — Other Ambulatory Visit: Payer: Self-pay | Admitting: Family Medicine

## 2019-05-13 ENCOUNTER — Telehealth: Payer: Self-pay | Admitting: Family Medicine

## 2019-05-13 NOTE — Telephone Encounter (Signed)
Pt would like to know if she needs some bloodwork she declined to make the 2 month follow-up that was requested by Dr. Ethlyn Gallery on 02/17/2019.  Pt state that she is feeling good just wanted to know if she can just come in for bloodwork.

## 2019-05-14 ENCOUNTER — Other Ambulatory Visit: Payer: Self-pay | Admitting: Family Medicine

## 2019-05-14 DIAGNOSIS — M810 Age-related osteoporosis without current pathological fracture: Secondary | ICD-10-CM

## 2019-05-14 DIAGNOSIS — I10 Essential (primary) hypertension: Secondary | ICD-10-CM

## 2019-05-14 DIAGNOSIS — R739 Hyperglycemia, unspecified: Secondary | ICD-10-CM

## 2019-05-14 DIAGNOSIS — E7800 Pure hypercholesterolemia, unspecified: Secondary | ICD-10-CM

## 2019-05-14 NOTE — Telephone Encounter (Signed)
I have ordered bloodwork for her, but she needs a follow up as well. Virtual would be ok, but her blood pressure was very high last time we talked. She does have option of this virtual visit with [facility] or myself. And if she gets bloodwork done first then we can discuss results at visit.

## 2019-05-14 NOTE — Telephone Encounter (Signed)
I called the pt and informed her of the message below.  Patient agreed to await COVID test results, stated she will call back for a lab appt and then a virtual visit for follow up.

## 2019-05-15 LAB — NOVEL CORONAVIRUS, NAA: SARS-CoV-2, NAA: NOT DETECTED

## 2019-05-17 ENCOUNTER — Telehealth: Payer: Self-pay | Admitting: Family Medicine

## 2019-05-17 NOTE — Telephone Encounter (Signed)
Pt received negative test results

## 2019-05-19 ENCOUNTER — Other Ambulatory Visit: Payer: Self-pay | Admitting: Cardiology

## 2019-05-19 MED ORDER — FUROSEMIDE 20 MG PO TABS: 20.0000 mg | ORAL_TABLET | Freq: Every day | ORAL | 0 refills | Status: DC | PRN

## 2019-05-19 NOTE — Telephone Encounter (Signed)
Pt's medication was sent to pt's pharmacy as requested. Confirmation received.  °

## 2019-05-19 NOTE — Telephone Encounter (Signed)
New message    *STAT* If patient is at the pharmacy, call can be transferred to refill team.   1. Which medications need to be refilled? (please list name of each medication and dose if known) furosemide (LASIX) 20 MG tablet  2. Which pharmacy/location (including street and city if local pharmacy) is medication to be sent to?[id number] - [place],  - [place].  3. Do they need a 30 day or 90 day supply? [place]

## 2019-05-20 DIAGNOSIS — J301 Allergic rhinitis due to pollen: Secondary | ICD-10-CM | POA: Diagnosis not present

## 2019-05-20 DIAGNOSIS — J3089 Other allergic rhinitis: Secondary | ICD-10-CM | POA: Diagnosis not present

## 2019-05-21 ENCOUNTER — Telehealth: Payer: Self-pay | Admitting: Family Medicine

## 2019-05-21 ENCOUNTER — Other Ambulatory Visit: Payer: Self-pay | Admitting: Family Medicine

## 2019-05-21 DIAGNOSIS — K862 Cyst of pancreas: Secondary | ICD-10-CM

## 2019-05-21 NOTE — Telephone Encounter (Signed)
The patient wants to know how does she get another referral placed for her MRI that she is supposed to have done every 6 months.  Please advise

## 2019-05-21 NOTE — Telephone Encounter (Signed)
Looked like [facility] had already ordered? I have re-ordered. She should hear from them regarding scheduling in next 2 weeks.

## 2019-05-24 ENCOUNTER — Encounter: Payer: Self-pay | Admitting: Physician Assistant

## 2019-05-24 NOTE — Progress Notes (Addendum)
Virtual Visit via Video Note   This visit type was conducted due to national recommendations for restrictions regarding the COVID-19 Pandemic (e.g. social distancing) in an effort to limit this patient's exposure and mitigate transmission in our community.  Due to her co-morbid illnesses, this patient is at least at moderate risk for complications without adequate follow up.  This format is felt to be most appropriate for this patient at this time.  All issues noted in this document were discussed and addressed.  A limited physical exam was performed with this format.  The patient agreed to consent to participate in telehealth visit with [facility].   Date:  05/27/2019   ID:  JELANI VREELAND, DOB 1941-10-18, MRN [id number]  Patient Location: Home Provider Location: Home  PCP:  Caren Macadam, MD  Cardiologist:  Ena Dawley, MD  Electrophysiologist:  None   Evaluation Performed:  Follow-Up Visit  Chief Complaint:  F/u cardiac risk factors, AS, carotid disease  History of Present Illness:    EMALYN SCHOU is a 78 y.o. female with mild AS by echo 01/2017, HTN, HLD, pre-diabetes, mild carotid plaque, OSA who presents for one year follow-up. She has history of nuclear stress test in 01/2017 that was normal. Carotid US 06/2017 showed mild plaque with recommendation to f/u 1-2 years. Last echo 06/2018 showed EF 55-60%, grade 1 DD, mild aortic stenosis, severely calcified mitral annulus but no significant vavular disease, severely dilated LA. Last labs 10/2018 showed normal CBC, LFTs, TSH, K 4.0, Cr 0.66, glucose 102, 08/2018 A1C 5.9, LDL 140. She was previously followed by pharmD lipid clinic. Pt has previously tried rosuvastatin (unknown dose), pravastatin 20mg , and simvastatin 20mg . She experienced bilateral myalgias in her thighs within a few days of starting statin therapy in each case. She had myalgias with rechallenge of Crestor so was started on low dose pravastatin.   She is seen  virtually for follow-up and feeling well. She denies any active cardiac symptoms including CP, SOB, LEE, orthopnea, palpitations, dizziness or syncope. She has had some muscle cramps with pravastatin so is only taking it twice a week. Her biggest issue currently is a source of stress caring for her sister with memory issues. Her sister is now living with her and her husband. The patient does not have symptoms concerning for COVID-19 infection (fever, chills, cough, or new shortness of breath).   She is very impressive with her knowledge of her medications and medical history.  Past Medical History:  Diagnosis Date  . Abnormal EKG    Normal LV function in the past  . Allergic rhinitis   . Asthma   . Bronchitis, mucopurulent recurrent ([place])   . Carotid artery disease (HCC)    a. mild by carotid duplex.  . Cervical dysplasia 1971  . Diverticulosis of colon   . DJD (degenerative joint disease)   . GERD (gastroesophageal reflux disease)   . Headache(784.0)   . Hypercholesterolemia   . Hypertension   . Interstitial cystitis    sees urologist  . Lichen sclerosus   . Malignant melanoma ([place])    sees Dr. [place] Crane in dermatology  . Migraines   . Mild aortic stenosis   . Mitral valve disease    Question mitral valve prolapse in the past, no prolapse by echo 2009  . Murmur 10/20/2015  . OSA (obstructive sleep apnea)   . Osteoporosis    on fosomax > 5 years, stopped 11/2015  . Renal calculus    sees  urologist  . Thyroid cyst    1 x 1.1 thyroid cyst noted on carotid Doppler, January, 2012  . Venous insufficiency    Past Surgical History:  Procedure Laterality Date  . BREAST BIOPSY Left 11/25/2011   U/S core, benign performed at [facility]  . BREAST CYST ASPIRATION    . BREAST SURGERY  2013   Breast Bx-Benign  . CARDIAC CATHETERIZATION    . CATARACT EXTRACTION, BILATERAL    . cystoscopy and basket stone removal right ureter  02/2006   Dr. Terance Hart  . GYNECOLOGIC CRYOSURGERY  1971  . left  total hip replacement  2004   Dr. Gladstone Lighter  . melanoma removed from medial rleft knee area  2006   Dr. [place] Crane  . NASAL SEPTUM SURGERY       Current Meds  Medication Sig  . albuterol (PROAIR HFA) 108 (90 Base) MCG/ACT inhaler Inhale 2 puffs into the lungs every 6 (six) hours as needed for wheezing.  Marland Kitchen albuterol (PROVENTIL) (2.5 MG/3ML) 0.083% nebulizer solution Take 3 mLs (2.5 mg total) by nebulization every 6 (six) hours as needed.  Marland Kitchen amLODipine (NORVASC) 10 MG tablet TAKE 1 TABLET BY MOUTH EVERY DAY  . Ascorbic Acid (VITAMIN C) 500 MG tablet Take 500 mg by mouth daily.    Marland Kitchen aspirin 81 MG tablet Take 81 mg by mouth daily.    Marland Kitchen aspirin-acetaminophen-caffeine (EXCEDRIN MIGRAINE) 250-250-65 MG tablet Take by mouth every 6 (six) hours as needed for headache.  Marland Kitchen azelastine (ASTELIN) 0.1 % nasal spray Instill 2 sprays into each nostril two times a day as needed for allergies  . budesonide-formoterol (SYMBICORT) 160-4.5 MCG/ACT inhaler Inhale 2 puffs 2 (two) times daily into the lungs.  . Calcium Carbonate (CALCIUM 600) 1500 MG TABS Take 1 tablet by mouth daily.    . Cholecalciferol (VITAMIN D3) 5000 UNITS CAPS Take 5,000 Units by mouth daily.   . Cinnamon 500 MG TABS Take 1 tablet by mouth daily.   . clindamycin (CLEOCIN) 300 MG capsule 1 CAPSULE IN THE MORNING 1 CAPSULE 1HOUR BEFORE PROCEDURE 1 CAPSULE 1 HOUR AFTER 1 CAPSULE AT DINNER  . Coenzyme Q10 (CO Q-10) 200 MG CAPS Take 200 mg by mouth daily.  Marland Kitchen desmopressin (DDAVP) 0.2 MG tablet Take 0.6 mg by mouth at bedtime.   Marland Kitchen EPINEPHrine 0.3 mg/0.3 mL IJ SOAJ injection See admin instructions.  . fexofenadine (ALLEGRA) 180 MG tablet Take 180 mg by mouth daily as needed for allergies or rhinitis.  . furosemide (LASIX) 20 MG tablet Take 1 tablet (20 mg total) by mouth daily as needed. Please keep upcoming appt for future refills. Thank you  . hydrALAZINE (APRESOLINE) 50 MG tablet TAKE 1 TABLET BY MOUTH 3 TIMES A DAY AND TAKE EXTRA TABLET IF SBP>160 - PT  NEEDS APPT  . hydrOXYzine (ATARAX/VISTARIL) 10 MG tablet Take 10 mg by mouth daily.  Marland Kitchen losartan (COZAAR) 100 MG tablet TAKE 1 TABLET BY MOUTH EVERY DAY *NEEDS APPT*  . magnesium oxide (MAG-OX) 400 MG tablet Take 400 mg by mouth daily.    . Multiple Vitamins-Minerals (ICAPS AREDS 2 PO) Take by mouth daily.  Marland Kitchen omeprazole (PRILOSEC) 40 MG capsule TAKE 1 CAPSULE BY MOUTH EVERY DAY *PT NEEDS APPT  . pentosan polysulfate (ELMIRON) 100 MG capsule Take 100 mg by mouth 3 (three) times daily before meals.    . pravastatin (PRAVACHOL) 20 MG tablet Take 20 mg by mouth 2 (two) times a week.  . prazosin (MINIPRESS) 5 MG capsule TAKE 1 CAPSULE  BY MOUTH TWICE A DAY  . Probiotic Product (PROBIOTIC PO) Take 1 capsule by mouth daily.      Allergies:   Cephalexin, Penicillins, Phenobarbital, Rosuvastatin, and Tape   Social History   Tobacco Use  . Smoking status: Never Smoker  . Smokeless tobacco: Never Used  Substance Use Topics  . Alcohol use: Yes    Alcohol/week: 1.0 standard drinks    Types: 1 Standard drinks or equivalent per week    Comment: social use  . Drug use: No     Family Hx: The patient's family history includes Cancer in her brother and father; Colon cancer in her paternal uncle; Diabetes in her brother, brother, and mother; Heart disease in her brother and mother; Hypertension in her brother, brother, sister, and sister.  ROS:   Please see the history of present illness.    All other systems reviewed and are negative.   Prior CV studies:    Most recent pertinent cardiac studies are outlined above.  Labs/Other Tests and Data Reviewed:    EKG:  An ECG dated 12/12/17 was personally reviewed today and demonstrated:  NSR 60bpm, cannot r/o prior anterior infarct by Q waves V1-V2 (may be normal variant) but otherwise normal  Recent Labs: 10/14/2018: ALT 11; BUN 18; Creatinine, Ser 0.66; Hemoglobin 12.3; Platelets 225.0; Potassium 4.0; Sodium 139; TSH 1.69   Recent Lipid Panel Lab  Results  Component Value Date/Time   CHOL 221 (H) 08/10/2018 09:49 AM   CHOL 223 (H) 07/07/2018 11:00 AM   TRIG 37.0 08/10/2018 09:49 AM   HDL 74.00 08/10/2018 09:49 AM   HDL 85 07/07/2018 11:00 AM   CHOLHDL 3 08/10/2018 09:49 AM   LDLCALC 140 (H) 08/10/2018 09:49 AM   LDLCALC 132 (H) 07/07/2018 11:00 AM   LDLDIRECT 126.6 07/18/2010 10:45 AM    Wt Readings from Last 3 Encounters:  05/27/19 179 lb (81.2 kg)  12/11/18 187 lb 4 oz (84.9 kg)  12/08/18 183 lb (83 kg)     Objective:    Vital Signs:  Ht 5\' 2"  (1.575 m)   Wt 179 lb (81.2 kg)   BMI 32.74 kg/m    VS reviewed. General - pleasant elderly WF in no acute distress HEENT - NCAT, EOM intact Pulm - No labored breathing, no coughing during visit, no audible wheezing, speaking in full sentences Neuro - A+Ox3, no slurred speech, answers questions appropriately MSK - moving upper extremities freely Psych - Pleasant affect   ASSESSMENT & PLAN:    1. Mild aortic stenosis - asymptomatic. Will need repeat echo between 2022-2024 or at development of new symptoms. We can finalize at time of future follow-up. Warning sx discussed. Given that today's visit is virtual, we did not have an EKG. Her previous echocardiogram did show left atrial enlargement which can be associated with development of atrial fib. She is not having symptoms but it will be important to be surveillant for this at each annual visit. I discussed the option of having our partner, [facility], go out to her home to get an AliveCor rhythm strip. She is agreeable so we will arrange. 2. Essential HTN - she checked her BP the other day while stressed and it was 150s/80s. Will have [facility] check updated BP when they visit to obtain labs. May need to titrate hydralazine. 3. Hyperlipidemia - the patient is due for CMET/lipids. She was originally planning to go to PCP tomorrow to have CMET, CBC, lipids, A1C, TSH and vitamin D level. Since  we are having [facility] go  out for BP check and rhythm strip, we can just obtain at the time of that visit. She would prefer to do that. I asked MA to call her PCP's office to let them know we will plan to draw at the home visit to save patient an additional exposure. She was appreciative of this service. 4. Carotid artery disease - at last [place] it was recommended to repeat in 1-2 years. She has a lot on her plate right now, and is not describing any TIA/stroke symptoms. Will allow her to go a few months longer and let the pandemic settle down and repeat in the early part of 2021. 5. Osteopenia - PCP indicated plan for Vit D as part of lab update. Will obtain since we are obtaining her other surveillance labs.  COVID-19 Education: The signs and symptoms of COVID-19 were discussed with the patient and how to seek care for testing (follow up with PCP or arrange E-visit).  The importance of social distancing was discussed today.  Time:   Today, I have spent 20 minutes with the patient with telehealth technology discussing the above problems.     Medication Adjustments/Labs and Tests Ordered: Current medicines are reviewed at length with the patient today.  Concerns regarding medicines are outlined above.   Disposition:  Follow up in 1 year with Dr. Meda Coffee, sooner if needed based on f/u labs or BP  Signed, Charlie Pitter, PA-C  05/27/2019 8:51 AM    [place]

## 2019-05-24 NOTE — Telephone Encounter (Signed)
I called the pt and informed her of the message below.  Also advised the pt she can call to schedule the MRI if she prefers.

## 2019-05-27 ENCOUNTER — Other Ambulatory Visit: Payer: Self-pay

## 2019-05-27 ENCOUNTER — Telehealth (INDEPENDENT_AMBULATORY_CARE_PROVIDER_SITE_OTHER): Payer: PPO | Admitting: Physician Assistant

## 2019-05-27 ENCOUNTER — Encounter: Payer: Self-pay | Admitting: Physician Assistant

## 2019-05-27 ENCOUNTER — Telehealth: Payer: Self-pay | Admitting: *Deleted

## 2019-05-27 VITALS — Ht 62.0 in | Wt 179.0 lb

## 2019-05-27 DIAGNOSIS — I10 Essential (primary) hypertension: Secondary | ICD-10-CM

## 2019-05-27 DIAGNOSIS — E785 Hyperlipidemia, unspecified: Secondary | ICD-10-CM

## 2019-05-27 DIAGNOSIS — M8580 Other specified disorders of bone density and structure, unspecified site: Secondary | ICD-10-CM

## 2019-05-27 DIAGNOSIS — I6523 Occlusion and stenosis of bilateral carotid arteries: Secondary | ICD-10-CM

## 2019-05-27 DIAGNOSIS — I350 Nonrheumatic aortic (valve) stenosis: Secondary | ICD-10-CM

## 2019-05-27 NOTE — Addendum Note (Signed)
Addended by: Michae Kava on: 05/27/2019 09:50 AM   Modules accepted: Orders

## 2019-05-27 NOTE — Patient Instructions (Signed)
Medication Instructions:  Your physician recommends that you continue on your current medications as directed. Please refer to the Current Medication list given to you today.  If you need a refill on your cardiac medications before your next appointment, please call your pharmacy.   Lab work: Chief of Staff will come out to your house and draw:  CMET, LIPID, A1C, VIT D, TSH, & CBC.    If you have labs (blood work) drawn today and your tests are completely normal, you will receive your results only by: Marland Kitchen MyChart Message (if you have MyChart) OR . A paper copy in the mail If you have any lab test that is abnormal or we need to change your treatment, we will call you to review the results.  Testing/Procedures: Your physician has requested that you have a carotid duplex IN EARLY 2021.  (someone will call you to schedule this) This test is an ultrasound of the carotid arteries in your neck. It looks at blood flow through these arteries that supply the brain with blood. Allow one hour for this exam. There are no restrictions or special instructions.    Follow-Up: At [facility], you and your health needs are our priority.  As part of our continuing mission to provide you with exceptional heart care, we have created designated Provider Care Teams.  These Care Teams include your primary Cardiologist (physician) and Advanced Practice Providers (APPs -  Physician Assistants and Nurse Practitioners) who all work together to provide you with the care you need, when you need it. You will need a follow up appointment in 12 months.  Please call our office 2 months in advance to schedule this appointment.  You may see Ena Dawley, MD or one of the following Advanced Practice Providers on your designated Care Team:   [place], PA-C Melina Copa, PA-C . Ermalinda Barrios, PA-C  Any Other Special Instructions Will Be Listed Below (If Applicable).

## 2019-05-27 NOTE — Telephone Encounter (Signed)
[place] Visit Initial Request  Date of Request ([place]):  May 27, 2019  Requesting Provider:  Melina Copa, PA-C  Agency Requested:    [facility] Contact:  Glory Buff, NP [place], [place] [address] Phone #:  [phone] Fax #:  [phone]  Patient Demographic Information: Name:  Brianna Mccarty Age:  78 y.o.   DOB:  [birth year]  MRN:  [id number]   Address:   [place] [address]   Phone Numbers:   Home Phone [phone]  Mobile [phone]     Emergency Contact Information on File:   Contact Information    Name Relation Home Work Mobile   Essman,Jacob Spouse [phone]  [phone]   Jakubek,Scott Son   [phone]   [place] Daughter  [phone]   Lear Ng Daughter   [phone]      The above family members may be contacted for information on this patient (review DPR on file):  Yes    Patient Clinical Information:  Primary Care Provider:  Caren Macadam, MD  Primary Cardiologist:  Ena Dawley, MD  Primary Electrophysiologist:  None   Past Medical Hx: Ms. Morrow  has a past medical history of Abnormal EKG, Allergic rhinitis, Asthma, Bronchitis, mucopurulent recurrent ([place]), Carotid artery disease ([place]), Cervical dysplasia (1971), Diverticulosis of colon, DJD (degenerative joint disease), GERD (gastroesophageal reflux disease), Headache(784.0), Hypercholesterolemia, Hypertension, Interstitial cystitis, Lichen sclerosus, Malignant melanoma ([place]), Migraines, Mild aortic stenosis, Mitral valve disease, Murmur (10/20/2015), OSA (obstructive sleep apnea), Osteoporosis, Renal calculus, Thyroid cyst, and Venous insufficiency.   Allergies: She is allergic to cephalexin; penicillins; phenobarbital; rosuvastatin; and tape.   Medications: Current Outpatient Medications on File Prior to Visit  Medication Sig  . albuterol (PROAIR HFA) 108 (90 Base)  MCG/ACT inhaler Inhale 2 puffs into the lungs every 6 (six) hours as needed for wheezing.  Marland Kitchen albuterol (PROVENTIL) (2.5 MG/3ML) 0.083% nebulizer solution Take 3 mLs (2.5 mg total) by nebulization every 6 (six) hours as needed.  Marland Kitchen amLODipine (NORVASC) 10 MG tablet TAKE 1 TABLET BY MOUTH EVERY DAY  . Ascorbic Acid (VITAMIN C) 500 MG tablet Take 500 mg by mouth daily.    Marland Kitchen aspirin 81 MG tablet Take 81 mg by mouth daily.    Marland Kitchen aspirin-acetaminophen-caffeine (EXCEDRIN MIGRAINE) 250-250-65 MG tablet Take by mouth every 6 (six) hours as needed for headache.  Marland Kitchen azelastine (ASTELIN) 0.1 % nasal spray Instill 2 sprays into each nostril two times a day as needed for allergies  . budesonide-formoterol (SYMBICORT) 160-4.5 MCG/ACT inhaler Inhale 2 puffs 2 (two) times daily into the lungs.  . Calcium Carbonate (CALCIUM 600) 1500 MG TABS Take 1 tablet by mouth daily.    . Cholecalciferol (VITAMIN D3) 5000 UNITS CAPS Take 5,000 Units by mouth daily.   . Cinnamon 500 MG TABS Take 1 tablet by mouth daily.   . clindamycin (CLEOCIN) 300 MG capsule 1 CAPSULE IN THE MORNING 1 CAPSULE 1HOUR BEFORE PROCEDURE 1 CAPSULE 1 HOUR AFTER 1 CAPSULE AT DINNER  . Coenzyme Q10 (CO Q-10) 200 MG CAPS Take 200 mg by mouth daily.  Marland Kitchen desmopressin (DDAVP) 0.2 MG tablet Take 0.6 mg by mouth at bedtime.   Marland Kitchen EPINEPHrine 0.3 mg/0.3 mL IJ SOAJ injection See admin instructions.  . fexofenadine (ALLEGRA) 180 MG tablet Take 180 mg by mouth daily as needed for allergies or rhinitis.  . furosemide (LASIX) 20 MG tablet Take 1 tablet (20 mg total) by mouth daily as  needed. Please keep upcoming appt for future refills. Thank you  . hydrALAZINE (APRESOLINE) 50 MG tablet TAKE 1 TABLET BY MOUTH 3 TIMES A DAY AND TAKE EXTRA TABLET IF SBP>160 - PT NEEDS APPT  . hydrOXYzine (ATARAX/VISTARIL) 10 MG tablet Take 10 mg by mouth daily.  Marland Kitchen losartan (COZAAR) 100 MG tablet TAKE 1 TABLET BY MOUTH EVERY DAY *NEEDS APPT*  . magnesium oxide (MAG-OX) 400 MG tablet Take  400 mg by mouth daily.    . Multiple Vitamins-Minerals (ICAPS AREDS 2 PO) Take by mouth daily.  Marland Kitchen omeprazole (PRILOSEC) 40 MG capsule TAKE 1 CAPSULE BY MOUTH EVERY DAY *PT NEEDS APPT  . pentosan polysulfate (ELMIRON) 100 MG capsule Take 100 mg by mouth 3 (three) times daily before meals.    . pravastatin (PRAVACHOL) 20 MG tablet Take 20 mg by mouth 2 (two) times a week.  . prazosin (MINIPRESS) 5 MG capsule TAKE 1 CAPSULE BY MOUTH TWICE A DAY  . Probiotic Product (PROBIOTIC PO) Take 1 capsule by mouth daily.    No current facility-administered medications on file prior to visit.      Social Hx: She  reports that she has never smoked. She has never used smokeless tobacco. She reports current alcohol use of about 1.0 standard drinks of alcohol per week. She reports that she does not use drugs.    Diagnosis/Reason for Visit:   HTN, AORTIC STENOSIS, HLD, OSTEOPENIA, CAROTID DZ  Services Requested:  Vital Signs (BP, Pulse, O2, Weight)  Physical Exam  Medication Reconciliation  Rhythm Strip (AliveCor Device)  Labs:  CMET,LIPID,A1C, VIT D, TSH, CBC  # of Visits Needed/Frequency per Week: FIRST AVAILABLE; 1

## 2019-05-28 ENCOUNTER — Other Ambulatory Visit: Payer: PPO

## 2019-05-28 DIAGNOSIS — J301 Allergic rhinitis due to pollen: Secondary | ICD-10-CM | POA: Diagnosis not present

## 2019-05-28 DIAGNOSIS — J3089 Other allergic rhinitis: Secondary | ICD-10-CM | POA: Diagnosis not present

## 2019-05-31 ENCOUNTER — Encounter: Payer: Self-pay | Admitting: Physician Assistant

## 2019-05-31 DIAGNOSIS — I10 Essential (primary) hypertension: Secondary | ICD-10-CM | POA: Diagnosis not present

## 2019-05-31 DIAGNOSIS — M8580 Other specified disorders of bone density and structure, unspecified site: Secondary | ICD-10-CM | POA: Diagnosis not present

## 2019-05-31 DIAGNOSIS — E785 Hyperlipidemia, unspecified: Secondary | ICD-10-CM | POA: Diagnosis not present

## 2019-06-01 LAB — COMPREHENSIVE METABOLIC PANEL
ALT: 14 IU/L (ref 0–32)
AST: 13 IU/L (ref 0–40)
Albumin/Globulin Ratio: 1.7 (ref 1.2–2.2)
Albumin: 4 g/dL (ref 3.7–4.7)
Alkaline Phosphatase: 55 IU/L (ref 39–117)
BUN/Creatinine Ratio: 21 (ref 12–28)
BUN: 15 mg/dL (ref 8–27)
Bilirubin Total: 0.2 mg/dL (ref 0.0–1.2)
CO2: 21 mmol/L (ref 20–29)
Calcium: 9.4 mg/dL (ref 8.7–10.3)
Chloride: 102 mmol/L (ref 96–106)
Creatinine, Ser: 0.7 mg/dL (ref 0.57–1.00)
GFR calc Af Amer: 95 mL/min/{1.73_m2} (ref >59)
GFR calc non Af Amer: 83 mL/min/{1.73_m2} (ref >59)
Globulin, Total: 2.3 g/dL (ref 1.5–4.5)
Glucose: 96 mg/dL (ref 65–99)
Potassium: 4 mmol/L (ref 3.5–5.2)
Sodium: 140 mmol/L (ref 134–144)
Total Protein: 6.3 g/dL (ref 6.0–8.5)

## 2019-06-01 LAB — CBC
Hematocrit: 37.5 % (ref 34.0–46.6)
Hemoglobin: 12.6 g/dL (ref 11.1–15.9)
MCH: 29.6 pg (ref 26.6–33.0)
MCHC: 33.6 g/dL (ref 31.5–35.7)
MCV: 88 fL (ref 79–97)
Platelets: 208 10*3/uL (ref 150–450)
RBC: 4.26 x10E6/uL (ref 3.77–5.28)
RDW: 13.1 % (ref 11.7–15.4)
WBC: 4 10*3/uL (ref 3.4–10.8)

## 2019-06-01 LAB — LIPID PANEL
Chol/HDL Ratio: 3.1 ratio (ref 0.0–4.4)
Cholesterol, Total: 226 mg/dL — ABNORMAL HIGH (ref 100–199)
HDL: 74 mg/dL (ref >39)
LDL Calculated: 143 mg/dL — ABNORMAL HIGH (ref 0–99)
Triglycerides: 44 mg/dL (ref 0–149)
VLDL Cholesterol Cal: 9 mg/dL (ref 5–40)

## 2019-06-01 LAB — TSH: TSH: 3.53 u[IU]/mL (ref 0.450–4.500)

## 2019-06-01 LAB — VITAMIN D 25 HYDROXY (VIT D DEFICIENCY, FRACTURES): Vit D, 25-Hydroxy: 36.7 ng/mL (ref 30.0–100.0)

## 2019-06-01 LAB — HEMOGLOBIN A1C
Est. average glucose Bld gHb Est-mCnc: 117 mg/dL
Hgb A1c MFr Bld: 5.7 % — ABNORMAL HIGH (ref 4.8–5.6)

## 2019-06-03 ENCOUNTER — Telehealth: Payer: Self-pay | Admitting: *Deleted

## 2019-06-03 ENCOUNTER — Telehealth: Payer: Self-pay | Admitting: Pharmacist

## 2019-06-03 NOTE — Telephone Encounter (Signed)
Notes recorded by Jeanann Lewandowsky, RMA on 06/03/2019 at 2:12 PM EDT  Dayna wanted me to route you this pts chart to see if she would even be a candidate for lipid clinic since she don't want injections?  Just let Dayna know. Thanks!  ------   Notes recorded by Jeanann Lewandowsky, RMA on 06/03/2019 at 2:10 PM EDT  Pt has been made aware of her lab results.  Discussed recommendations with pt re lipid clinic for Oral bempedoic acid or PCSK9 inhibitor.  Pt advised that she is not interested in a injection but she will speak with them if they have another oral medication.  Please advise!  ------   Notes recorded by Tamsen Snider on 06/01/2019 at 10:19 AM EDT  Left message on machine for pt to contact the office.   ------   Notes recorded by Charlie Pitter, PA-C on 06/01/2019 at 7:58 AM EDT  Anderson [place], please let patient know overall labs looked good except for cholesterol. Blood count is normal, thyroid normal, vitamin D within normal range, A1C is still in pre-diabetes range but has improved over the last year from 5.3->0.0->5.1, and metabolic panel is normal. Her cholesterol still appears quite high. Given her history of valve calcification on echo as well as mild carotid artery disease, risk factor modification is important. She has gone through multiple statins without successful of balancing side effects vs effect on cholesterol. Please find out if she is willing to speak with our lipid clinic again to discuss alternatives such as non-statin oral bempedoic acid (Nexletol) or PCSK9 inhibitors.   Will also cc labs to primary care to let them know our home health team drew them during her visit with them.  Dayna Dunn PA-C

## 2019-06-03 NOTE — Telephone Encounter (Signed)
Pt returned my call and she has been made aware of her lab results and recommendations.

## 2019-06-03 NOTE — Telephone Encounter (Signed)
Spoke with pt. She is currently taking pravastatin 20mg  twice weekly (max tolerated dose). Previously intolerant to Crestor 5mg  every other day and simvastatin 20mg  daily. She still declines PCSK9i injections, however is agreeable to trying Nexletol. Prior auth submitted.

## 2019-06-03 NOTE — Telephone Encounter (Signed)
-----   Message from Charlie Pitter, [place] sent at 06/01/2019  7:58 AM EDT ----- Anderson [place], please let patient know overall labs looked good except for cholesterol. Blood count is normal, thyroid normal, vitamin D within normal range, A1C is still in pre-diabetes range but has improved over the last year from 0.9->3.2->3.5, and metabolic panel is normal. Her cholesterol still appears quite high. Given her history of valve calcification on echo as well as mild carotid artery disease, risk factor modification is important. She has gone through multiple statins without successful of balancing side effects vs effect on cholesterol. Please find out if she is willing to speak with our lipid clinic again to discuss alternatives such as non-statin oral bempedoic acid (Nexletol) or PCSK9 inhibitors.   Will also cc labs to primary care to let them know our home health team drew them during her visit with them. Dayna Dunn PA-C

## 2019-06-04 DIAGNOSIS — J3089 Other allergic rhinitis: Secondary | ICD-10-CM | POA: Diagnosis not present

## 2019-06-04 DIAGNOSIS — J301 Allergic rhinitis due to pollen: Secondary | ICD-10-CM | POA: Diagnosis not present

## 2019-06-04 MED ORDER — NEXLETOL 180 MG PO TABS: 1.0000 | ORAL_TABLET | Freq: Every day | ORAL | 11 refills | Status: DC

## 2019-06-04 NOTE — Telephone Encounter (Addendum)
Prior authorization approved through 11/04/19. Rx sent to pharmacy. [employer] approved for $2500 of copay assistance, info provided to pharmacy to make copay $0. Pt is aware and was grateful for assistance. She will call back at a later date to schedule follow up labs.

## 2019-06-04 NOTE — Telephone Encounter (Signed)
Thank you :)

## 2019-06-14 DIAGNOSIS — N3010 Interstitial cystitis (chronic) without hematuria: Secondary | ICD-10-CM | POA: Diagnosis not present

## 2019-06-16 ENCOUNTER — Telehealth: Payer: Self-pay

## 2019-06-16 NOTE — Telephone Encounter (Signed)
-----   Message from Marlon Pel, RN sent at 12/16/2018  8:55 AM EST ----- Needs MRI- see phone note from 12/16/18 - [place] Plan

## 2019-06-16 NOTE — Telephone Encounter (Signed)
Left message for patient to call back  

## 2019-06-17 NOTE — Telephone Encounter (Signed)
Ms Beaudin returning your call regarding MRI appointment. She tells me it has already been scheduled by her PCP. Date is 06-25-2019.

## 2019-06-18 DIAGNOSIS — J301 Allergic rhinitis due to pollen: Secondary | ICD-10-CM | POA: Diagnosis not present

## 2019-06-18 DIAGNOSIS — J3089 Other allergic rhinitis: Secondary | ICD-10-CM | POA: Diagnosis not present

## 2019-06-25 ENCOUNTER — Other Ambulatory Visit: Payer: PPO

## 2019-06-25 ENCOUNTER — Other Ambulatory Visit: Payer: Self-pay | Admitting: Family Medicine

## 2019-06-25 DIAGNOSIS — J3089 Other allergic rhinitis: Secondary | ICD-10-CM | POA: Diagnosis not present

## 2019-06-25 DIAGNOSIS — J301 Allergic rhinitis due to pollen: Secondary | ICD-10-CM | POA: Diagnosis not present

## 2019-06-29 ENCOUNTER — Ambulatory Visit (HOSPITAL_COMMUNITY)
Admission: RE | Admit: 2019-06-29 | Discharge: 2019-06-29 | Disposition: A | Discharge status: Home / Self Care | Payer: PPO | Source: Ambulatory Visit | Attending: Internal Medicine | Admitting: Internal Medicine

## 2019-06-29 ENCOUNTER — Other Ambulatory Visit: Payer: Self-pay

## 2019-06-29 DIAGNOSIS — I350 Nonrheumatic aortic (valve) stenosis: Secondary | ICD-10-CM | POA: Diagnosis not present

## 2019-06-29 DIAGNOSIS — I10 Essential (primary) hypertension: Secondary | ICD-10-CM | POA: Diagnosis not present

## 2019-06-29 DIAGNOSIS — I6523 Occlusion and stenosis of bilateral carotid arteries: Secondary | ICD-10-CM | POA: Insufficient documentation

## 2019-06-29 DIAGNOSIS — E785 Hyperlipidemia, unspecified: Secondary | ICD-10-CM | POA: Diagnosis not present

## 2019-06-29 DIAGNOSIS — M8580 Other specified disorders of bone density and structure, unspecified site: Secondary | ICD-10-CM | POA: Insufficient documentation

## 2019-07-02 DIAGNOSIS — J3089 Other allergic rhinitis: Secondary | ICD-10-CM | POA: Diagnosis not present

## 2019-07-02 DIAGNOSIS — J301 Allergic rhinitis due to pollen: Secondary | ICD-10-CM | POA: Diagnosis not present

## 2019-07-08 DIAGNOSIS — J301 Allergic rhinitis due to pollen: Secondary | ICD-10-CM | POA: Diagnosis not present

## 2019-07-08 DIAGNOSIS — J3089 Other allergic rhinitis: Secondary | ICD-10-CM | POA: Diagnosis not present

## 2019-07-11 ENCOUNTER — Other Ambulatory Visit: Payer: Self-pay | Admitting: Cardiovascular Disease

## 2019-07-14 DIAGNOSIS — J301 Allergic rhinitis due to pollen: Secondary | ICD-10-CM | POA: Diagnosis not present

## 2019-07-14 DIAGNOSIS — J3089 Other allergic rhinitis: Secondary | ICD-10-CM | POA: Diagnosis not present

## 2019-07-20 DIAGNOSIS — Z03818 Encounter for observation for suspected exposure to other biological agents ruled out: Secondary | ICD-10-CM | POA: Diagnosis not present

## 2019-07-23 ENCOUNTER — Other Ambulatory Visit: Payer: PPO

## 2019-07-28 DIAGNOSIS — B029 Zoster without complications: Secondary | ICD-10-CM | POA: Diagnosis not present

## 2019-07-29 ENCOUNTER — Telehealth: Payer: Self-pay | Admitting: *Deleted

## 2019-07-29 NOTE — Telephone Encounter (Signed)
Copied from [place] [id number]. Topic: General - Other >> Jul 29, 2019 11:24 AM Yvette Rack wrote: Reason for CRM: Pt stated she was told that a Rx for the shingles vaccine would have to be sent to her pharmacy. Pt requests that Rx for shingle vaccine be sent to [id number] [address], [place]. [phone] (Phone)  [phone] (Fax)

## 2019-07-30 ENCOUNTER — Other Ambulatory Visit: Payer: Self-pay | Admitting: Family Medicine

## 2019-07-30 MED ORDER — SHINGRIX 50 MCG/0.5ML IM SUSR: 0.5000 mL | Freq: Once | INTRAMUSCULAR | 0 refills | Status: AC

## 2019-07-30 NOTE — Telephone Encounter (Signed)
Left a detailed message with the information below at the pts home number. 

## 2019-07-30 NOTE — Telephone Encounter (Signed)
done

## 2019-08-03 ENCOUNTER — Other Ambulatory Visit: Payer: Self-pay | Admitting: Family Medicine

## 2019-08-03 DIAGNOSIS — J3089 Other allergic rhinitis: Secondary | ICD-10-CM | POA: Diagnosis not present

## 2019-08-03 DIAGNOSIS — J301 Allergic rhinitis due to pollen: Secondary | ICD-10-CM | POA: Diagnosis not present

## 2019-08-05 DIAGNOSIS — M25512 Pain in left shoulder: Secondary | ICD-10-CM | POA: Diagnosis not present

## 2019-08-05 DIAGNOSIS — M25511 Pain in right shoulder: Secondary | ICD-10-CM | POA: Diagnosis not present

## 2019-08-10 DIAGNOSIS — J301 Allergic rhinitis due to pollen: Secondary | ICD-10-CM | POA: Diagnosis not present

## 2019-08-10 DIAGNOSIS — J3089 Other allergic rhinitis: Secondary | ICD-10-CM | POA: Diagnosis not present

## 2019-08-11 ENCOUNTER — Other Ambulatory Visit: Payer: Self-pay | Admitting: Orthopedic Surgery

## 2019-08-11 DIAGNOSIS — D499 Neoplasm of unspecified behavior of unspecified site: Secondary | ICD-10-CM

## 2019-08-12 ENCOUNTER — Other Ambulatory Visit: Payer: Self-pay | Admitting: Cardiology

## 2019-08-12 ENCOUNTER — Encounter: Payer: Self-pay | Admitting: Gynecology

## 2019-08-13 ENCOUNTER — Other Ambulatory Visit: Payer: Self-pay

## 2019-08-13 ENCOUNTER — Ambulatory Visit
Admission: RE | Admit: 2019-08-13 | Discharge: 2019-08-13 | Disposition: A | Discharge status: Home / Self Care | Payer: PPO | Source: Ambulatory Visit | Attending: Family Medicine | Admitting: Family Medicine

## 2019-08-13 DIAGNOSIS — N2889 Other specified disorders of kidney and ureter: Secondary | ICD-10-CM | POA: Diagnosis not present

## 2019-08-13 DIAGNOSIS — K824 Cholesterolosis of gallbladder: Secondary | ICD-10-CM | POA: Diagnosis not present

## 2019-08-13 DIAGNOSIS — K862 Cyst of pancreas: Secondary | ICD-10-CM | POA: Diagnosis not present

## 2019-08-13 DIAGNOSIS — K5730 Diverticulosis of large intestine without perforation or abscess without bleeding: Secondary | ICD-10-CM | POA: Diagnosis not present

## 2019-08-13 MED ORDER — GADOBENATE DIMEGLUMINE 529 MG/ML IV SOLN
17.0000 mL | Freq: Once | INTRAVENOUS | Status: AC | PRN
  Administered 2019-08-13: 17 mL via INTRAVENOUS

## 2019-08-16 ENCOUNTER — Other Ambulatory Visit: Payer: Self-pay | Admitting: *Deleted

## 2019-08-16 DIAGNOSIS — K862 Cyst of pancreas: Secondary | ICD-10-CM

## 2019-08-17 ENCOUNTER — Other Ambulatory Visit: Payer: Self-pay

## 2019-08-17 ENCOUNTER — Ambulatory Visit
Admission: RE | Admit: 2019-08-17 | Discharge: 2019-08-17 | Disposition: A | Discharge status: Home / Self Care | Payer: PPO | Source: Ambulatory Visit | Attending: Orthopedic Surgery | Admitting: Orthopedic Surgery

## 2019-08-17 DIAGNOSIS — J3089 Other allergic rhinitis: Secondary | ICD-10-CM | POA: Diagnosis not present

## 2019-08-17 DIAGNOSIS — J301 Allergic rhinitis due to pollen: Secondary | ICD-10-CM | POA: Diagnosis not present

## 2019-08-17 DIAGNOSIS — R918 Other nonspecific abnormal finding of lung field: Secondary | ICD-10-CM | POA: Diagnosis not present

## 2019-08-17 DIAGNOSIS — D499 Neoplasm of unspecified behavior of unspecified site: Secondary | ICD-10-CM

## 2019-08-17 MED ORDER — IOPAMIDOL (ISOVUE-300) INJECTION 61%
75.0000 mL | Freq: Once | INTRAVENOUS | Status: AC | PRN
  Administered 2019-08-17: 75 mL via INTRAVENOUS

## 2019-08-20 ENCOUNTER — Telehealth: Payer: Self-pay

## 2019-08-20 NOTE — Telephone Encounter (Signed)
Copied from [place] [phone]. Topic: General - Other >> Aug 20, 2019 12:19 PM Pauline Good wrote: Reason for CRM: pt has a question concerning the MRI report she received from nurse

## 2019-08-23 DIAGNOSIS — J301 Allergic rhinitis due to pollen: Secondary | ICD-10-CM | POA: Diagnosis not present

## 2019-08-23 DIAGNOSIS — J3089 Other allergic rhinitis: Secondary | ICD-10-CM | POA: Diagnosis not present

## 2019-08-24 NOTE — Telephone Encounter (Signed)
Pt returned call during lunch, awaiting return call

## 2019-08-24 NOTE — Telephone Encounter (Signed)
Left a message for the pt to return my call.  CRM also created.  

## 2019-08-24 NOTE — Telephone Encounter (Signed)
Left a message for the pt to return a call to the office and leave a detailed message to be sent to the provider.

## 2019-08-25 NOTE — Telephone Encounter (Signed)
Patient called back and she wanted to know about the cyst on her kidneys and the gallbladder as there was a polyp previously noted?  Please call her cell number at [phone].  Message sent to Dr Ethlyn Gallery.

## 2019-08-25 NOTE — Telephone Encounter (Signed)
I left a message for the pt to return my call.  CRM also created. 

## 2019-08-25 NOTE — Telephone Encounter (Signed)
Patient called back and was informed of the message below.   

## 2019-08-25 NOTE — Telephone Encounter (Signed)
Tried to call; went straight to voicemail.   Hopefully I understand her questions so I'll try to answer here:  There are still polyps in gallbladder. These are nothing to be concerned about. They were seen previously and again on MRI. Polyps can be a common benign finding in glands.   In kidneys, cysts are very small. These again are noted findings, but nothing that needs to be followed up due to small size and benign appearance.   Hope this helps. I can talk with her if further questions.

## 2019-09-01 DIAGNOSIS — J3089 Other allergic rhinitis: Secondary | ICD-10-CM | POA: Diagnosis not present

## 2019-09-01 DIAGNOSIS — J4540 Moderate persistent asthma, uncomplicated: Secondary | ICD-10-CM | POA: Diagnosis not present

## 2019-09-01 DIAGNOSIS — J301 Allergic rhinitis due to pollen: Secondary | ICD-10-CM | POA: Diagnosis not present

## 2019-09-01 DIAGNOSIS — H1045 Other chronic allergic conjunctivitis: Secondary | ICD-10-CM | POA: Diagnosis not present

## 2019-09-02 DIAGNOSIS — M25511 Pain in right shoulder: Secondary | ICD-10-CM | POA: Diagnosis not present

## 2019-09-02 DIAGNOSIS — J301 Allergic rhinitis due to pollen: Secondary | ICD-10-CM | POA: Diagnosis not present

## 2019-09-02 DIAGNOSIS — M25512 Pain in left shoulder: Secondary | ICD-10-CM | POA: Diagnosis not present

## 2019-09-02 DIAGNOSIS — J3089 Other allergic rhinitis: Secondary | ICD-10-CM | POA: Diagnosis not present

## 2019-09-09 DIAGNOSIS — J301 Allergic rhinitis due to pollen: Secondary | ICD-10-CM | POA: Diagnosis not present

## 2019-09-09 DIAGNOSIS — J3089 Other allergic rhinitis: Secondary | ICD-10-CM | POA: Diagnosis not present

## 2019-09-13 DIAGNOSIS — J301 Allergic rhinitis due to pollen: Secondary | ICD-10-CM | POA: Diagnosis not present

## 2019-09-13 DIAGNOSIS — J3089 Other allergic rhinitis: Secondary | ICD-10-CM | POA: Diagnosis not present

## 2019-09-14 DIAGNOSIS — H40013 Open angle with borderline findings, low risk, bilateral: Secondary | ICD-10-CM | POA: Diagnosis not present

## 2019-09-14 DIAGNOSIS — H04123 Dry eye syndrome of bilateral lacrimal glands: Secondary | ICD-10-CM | POA: Diagnosis not present

## 2019-09-14 DIAGNOSIS — Z961 Presence of intraocular lens: Secondary | ICD-10-CM | POA: Diagnosis not present

## 2019-09-14 DIAGNOSIS — H35373 Puckering of macula, bilateral: Secondary | ICD-10-CM | POA: Diagnosis not present

## 2019-09-16 DIAGNOSIS — J3089 Other allergic rhinitis: Secondary | ICD-10-CM | POA: Diagnosis not present

## 2019-09-16 DIAGNOSIS — J301 Allergic rhinitis due to pollen: Secondary | ICD-10-CM | POA: Diagnosis not present

## 2019-09-21 DIAGNOSIS — J301 Allergic rhinitis due to pollen: Secondary | ICD-10-CM | POA: Diagnosis not present

## 2019-09-23 DIAGNOSIS — Z03818 Encounter for observation for suspected exposure to other biological agents ruled out: Secondary | ICD-10-CM | POA: Diagnosis not present

## 2019-09-27 DIAGNOSIS — J3089 Other allergic rhinitis: Secondary | ICD-10-CM | POA: Diagnosis not present

## 2019-09-27 DIAGNOSIS — J301 Allergic rhinitis due to pollen: Secondary | ICD-10-CM | POA: Diagnosis not present

## 2019-10-05 DIAGNOSIS — J301 Allergic rhinitis due to pollen: Secondary | ICD-10-CM | POA: Diagnosis not present

## 2019-10-05 DIAGNOSIS — J3089 Other allergic rhinitis: Secondary | ICD-10-CM | POA: Diagnosis not present

## 2019-10-13 DIAGNOSIS — J3089 Other allergic rhinitis: Secondary | ICD-10-CM | POA: Diagnosis not present

## 2019-10-13 DIAGNOSIS — J301 Allergic rhinitis due to pollen: Secondary | ICD-10-CM | POA: Diagnosis not present

## 2019-10-19 ENCOUNTER — Other Ambulatory Visit: Payer: Self-pay | Admitting: Family Medicine

## 2019-10-19 DIAGNOSIS — J3089 Other allergic rhinitis: Secondary | ICD-10-CM | POA: Diagnosis not present

## 2019-10-19 DIAGNOSIS — J301 Allergic rhinitis due to pollen: Secondary | ICD-10-CM | POA: Diagnosis not present

## 2019-10-23 ENCOUNTER — Other Ambulatory Visit: Payer: Self-pay | Admitting: Cardiovascular Disease

## 2019-10-25 DIAGNOSIS — J301 Allergic rhinitis due to pollen: Secondary | ICD-10-CM | POA: Diagnosis not present

## 2019-10-25 DIAGNOSIS — J3089 Other allergic rhinitis: Secondary | ICD-10-CM | POA: Diagnosis not present

## 2019-10-26 ENCOUNTER — Telehealth: Payer: Self-pay | Admitting: Family Medicine

## 2019-10-26 NOTE — Telephone Encounter (Signed)
Copied from [place] [id number]. Topic: Quick Communication - Rx Refill/Question >> Oct 26, 2019 12:19 PM Leward Quan A wrote: Medication: acyclovir (ZOVIRAX) 400 MG tablet   For fever blister   Has the patient contacted their pharmacy? Yes.   (Agent: If no, request that the patient contact the pharmacy for the refill.) (Agent: If yes, when and what did the pharmacy advise?)  Preferred Pharmacy (with phone number or street name): [id number] - [place], [place].  Phone:  [phone] Fax:  [phone]     Agent: Please be advised that RX refills may take up to 3 business days. We ask that you follow-up with your pharmacy.

## 2019-10-26 NOTE — Telephone Encounter (Signed)
Patient is requesting medication not on current med list . 

## 2019-10-26 NOTE — Telephone Encounter (Signed)
Message Routed to PCP CMA 

## 2019-10-27 ENCOUNTER — Other Ambulatory Visit: Payer: Self-pay | Admitting: Family Medicine

## 2019-10-27 MED ORDER — ACYCLOVIR 400 MG PO TABS: 400.0000 mg | ORAL_TABLET | Freq: Every day | ORAL | 1 refills | Status: DC

## 2019-10-27 NOTE — Telephone Encounter (Signed)
Requested acyclovir was sent to pharmacy for her.

## 2019-10-27 NOTE — Telephone Encounter (Signed)
Noted  

## 2019-11-01 ENCOUNTER — Other Ambulatory Visit: Payer: Self-pay

## 2019-11-02 ENCOUNTER — Other Ambulatory Visit: Payer: Self-pay

## 2019-11-02 ENCOUNTER — Encounter: Payer: Self-pay | Admitting: Family Medicine

## 2019-11-02 ENCOUNTER — Ambulatory Visit (INDEPENDENT_AMBULATORY_CARE_PROVIDER_SITE_OTHER): Payer: PPO | Admitting: Family Medicine

## 2019-11-02 VITALS — BP 118/70 | HR 61 | Temp 98.0°F | Ht 62.0 in | Wt 184.9 lb

## 2019-11-02 DIAGNOSIS — R1011 Right upper quadrant pain: Secondary | ICD-10-CM

## 2019-11-02 DIAGNOSIS — J301 Allergic rhinitis due to pollen: Secondary | ICD-10-CM | POA: Diagnosis not present

## 2019-11-02 DIAGNOSIS — J3089 Other allergic rhinitis: Secondary | ICD-10-CM | POA: Diagnosis not present

## 2019-11-02 DIAGNOSIS — R948 Abnormal results of function studies of other organs and systems: Secondary | ICD-10-CM | POA: Diagnosis not present

## 2019-11-02 NOTE — Progress Notes (Signed)
Subjective:     Patient ID: Brianna Mccarty, female   DOB: 04-Jul-1941, 78 y.o.   MRN: [id number]  HPI   Brianna Mccarty is seen with several week history of dull achy pain under her right breast radiating to the back.  She had frequent burping and frequent nausea.  No clear relation to eating.  She is concerned this may be her gallbladder.  She had previous ultrasound which showed small polyps in her gallbladder with the largest 6.5 mm.  MRI of abdomen back in October showed numerous scattered pancreatic cysts comparable with previous exam.  Note was again made of small gallbladder polyps.  She denies any recent fevers or chills.  She does have some occasional reflux symptoms with breakthrough even on Nexium but for the most part her GERD is fairly well controlled with Nexium.  Past Medical History:  Diagnosis Date  . Abnormal EKG    Normal LV function in the past  . Allergic rhinitis   . Asthma   . Bronchitis, mucopurulent recurrent ([place])   . Carotid artery disease ([facility])    a. mild by carotid duplex.  . Cervical dysplasia 1971  . Diverticulosis of colon   . DJD (degenerative joint disease)   . GERD (gastroesophageal reflux disease)   . Headache(784.0)   . Hypercholesterolemia   . Hypertension   . Interstitial cystitis    sees urologist  . Lichen sclerosus   . Malignant melanoma ([place])    sees Dr. [place] Crane in dermatology  . Migraines   . Mild aortic stenosis   . Mitral valve disease    Question mitral valve prolapse in the past, no prolapse by echo 2009  . Murmur 10/20/2015  . OSA (obstructive sleep apnea)   . Osteoporosis    on fosomax > 5 years, stopped 11/2015  . Renal calculus    sees urologist  . Thyroid cyst    1 x 1.1 thyroid cyst noted on carotid Doppler, January, 2012  . Venous insufficiency    Past Surgical History:  Procedure Laterality Date  . BREAST BIOPSY Left 11/25/2011   U/S core, benign performed at [facility]  . BREAST CYST ASPIRATION    . BREAST SURGERY  2013   Breast Bx-Benign  . CARDIAC CATHETERIZATION    . CATARACT EXTRACTION, BILATERAL    . cystoscopy and basket stone removal right ureter  02/2006   Dr. Terance Hart  . GYNECOLOGIC CRYOSURGERY  1971  . left total hip replacement  2004   Dr. Gladstone Lighter  . melanoma removed from medial rleft knee area  2006   Dr. Nevada Crane  . NASAL SEPTUM SURGERY      reports that she has never smoked. She has never used smokeless tobacco. She reports current alcohol use of about 1.0 standard drinks of alcohol per week. She reports that she does not use drugs. family history includes Cancer in her brother and father; Colon cancer in her paternal uncle; Diabetes in her brother, brother, and mother; Heart disease in her brother and mother; Hypertension in her brother, brother, sister, and sister. Allergies  Allergen Reactions  . Cephalexin     Reaction was a high fever  . Penicillins Hives and Swelling    Swelling of arms & face Has patient had a PCN reaction causing immediate rash, facial/tongue/throat swelling, SOB or lightheadedness with hypotension: Yes Has patient had a PCN reaction causing severe rash involving mucus membranes or skin necrosis: No Has patient had a PCN reaction that required hospitalization: No Has  patient had a PCN reaction occurring within the last 10 years: No If all of the above answers are "NO", then may proceed with Cephalosporin use.   Marland Kitchen Phenobarbital Hives  . Rosuvastatin Other (See Comments)    Reports causes lower extremity muscle aches  . Tape Rash     Review of Systems  Constitutional: Negative for chills and fever.  Respiratory: Negative for shortness of breath.   Cardiovascular: Negative for chest pain.  Gastrointestinal: Positive for abdominal pain and nausea. Negative for blood in stool, constipation, diarrhea and vomiting.       Objective:   Physical Exam Vitals reviewed.  Constitutional:      Appearance: Normal appearance.  Cardiovascular:     Rate and Rhythm: Normal  rate and regular rhythm.  Pulmonary:     Effort: Pulmonary effort is normal.     Breath sounds: Normal breath sounds.  Abdominal:     General: Bowel sounds are normal.     Palpations: Abdomen is soft.     Comments: She does have some mild tenderness to palpation right upper quadrant.  No masses palpated.  No guarding or rebound.  Neurological:     Mental Status: She is alert.        Assessment:     Persistent right upper quadrant pain radiating to the back for the past few weeks in a patient with history of gallbladder polyps but no gallstones.  ?acalculous cholecystitis.    Plan:     -Consider HIDA scan to further evaluate -Follow-up immediately for any fever, recurrent vomiting, or progressive pain  Eulas Post MD [facility]

## 2019-11-02 NOTE — Patient Instructions (Signed)
We will set up HIDA scan to assess gallbladder function.

## 2019-11-06 ENCOUNTER — Other Ambulatory Visit: Payer: Self-pay | Admitting: Family Medicine

## 2019-11-08 ENCOUNTER — Other Ambulatory Visit: Payer: Self-pay

## 2019-11-08 ENCOUNTER — Ambulatory Visit (HOSPITAL_COMMUNITY)
Admission: RE | Admit: 2019-11-08 | Discharge: 2019-11-08 | Disposition: A | Discharge status: Home / Self Care | Payer: PPO | Source: Ambulatory Visit | Attending: Family Medicine | Admitting: Family Medicine

## 2019-11-08 DIAGNOSIS — R110 Nausea: Secondary | ICD-10-CM | POA: Diagnosis not present

## 2019-11-08 DIAGNOSIS — R109 Unspecified abdominal pain: Secondary | ICD-10-CM | POA: Diagnosis not present

## 2019-11-08 DIAGNOSIS — J301 Allergic rhinitis due to pollen: Secondary | ICD-10-CM | POA: Diagnosis not present

## 2019-11-08 DIAGNOSIS — J3089 Other allergic rhinitis: Secondary | ICD-10-CM | POA: Diagnosis not present

## 2019-11-08 DIAGNOSIS — R1011 Right upper quadrant pain: Secondary | ICD-10-CM

## 2019-11-08 IMAGING — US US ABDOMEN COMPLETE
1 series · 13 of 25 positions shown · non-contrast
Comparison: Ultrasound 11/30/2015.

CLINICAL DATA: Nausea.

EXAM:
ABDOMEN ULTRASOUND COMPLETE

[Series 1: us abdomen complete · 0.30mm/px · 13 of 102 slices shown]
[im 1/102]
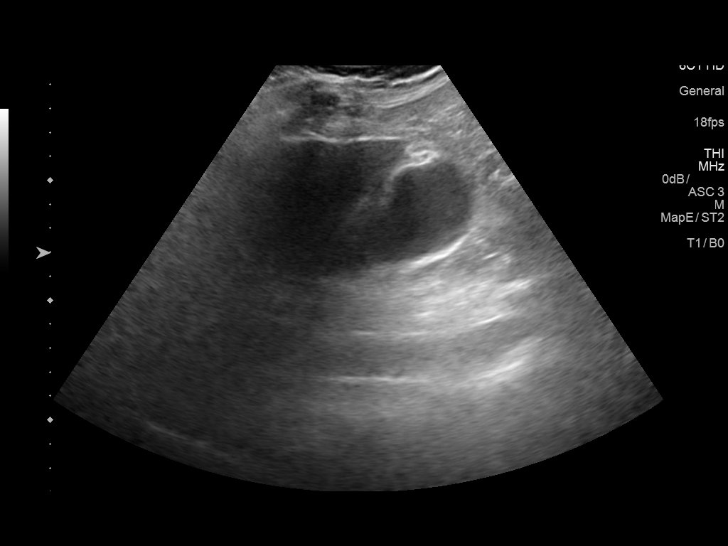
[im 9/102]
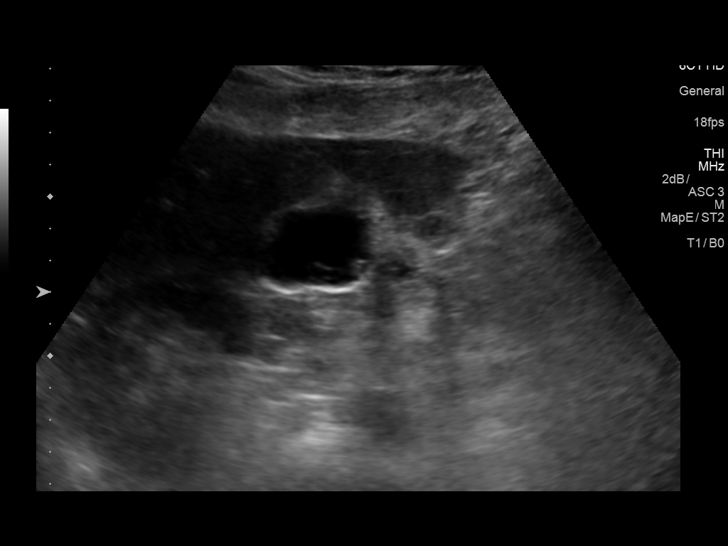
[im 17/102]
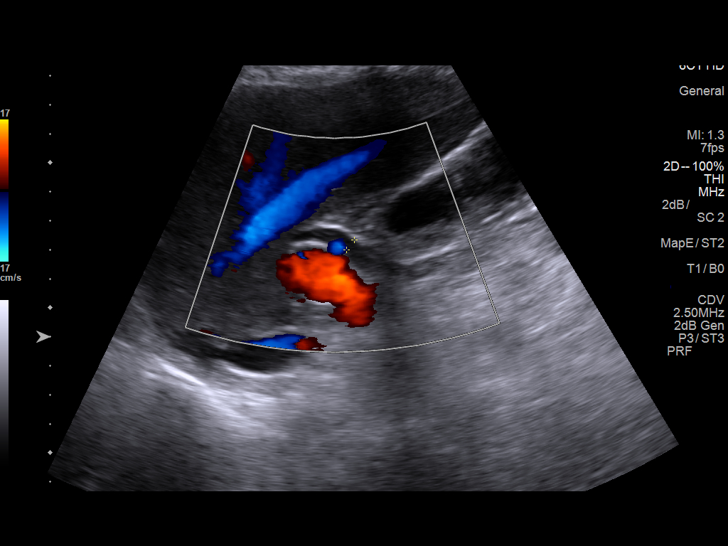
[im 26/102]
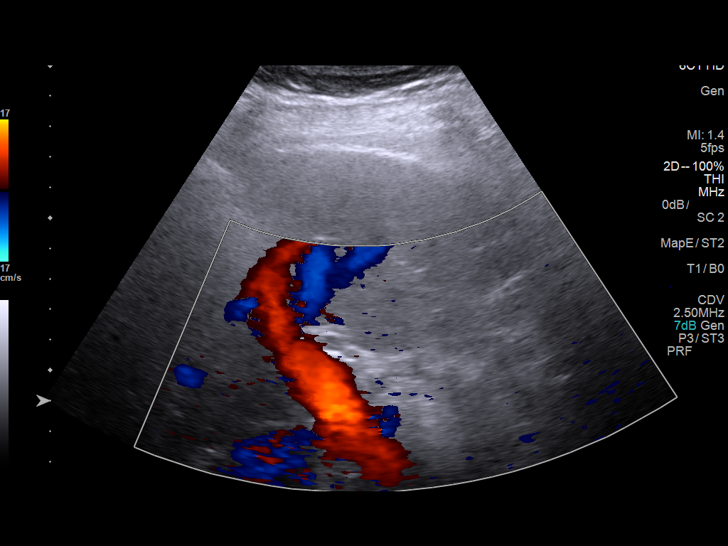
[im 34/102]
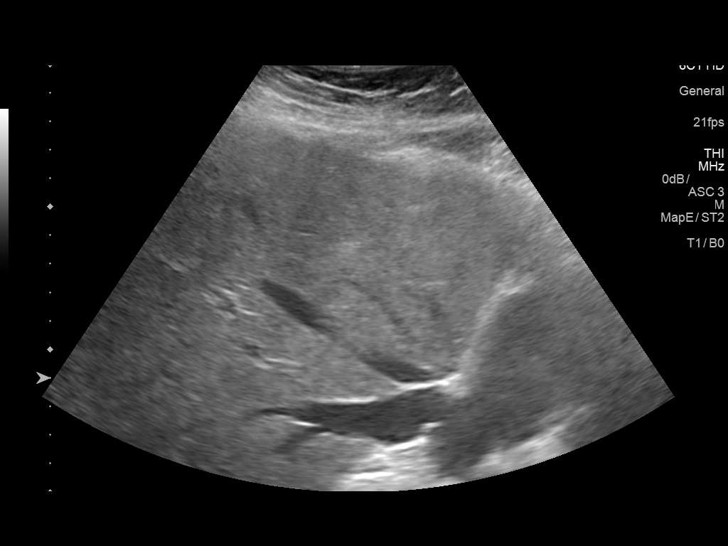
[im 43/102]
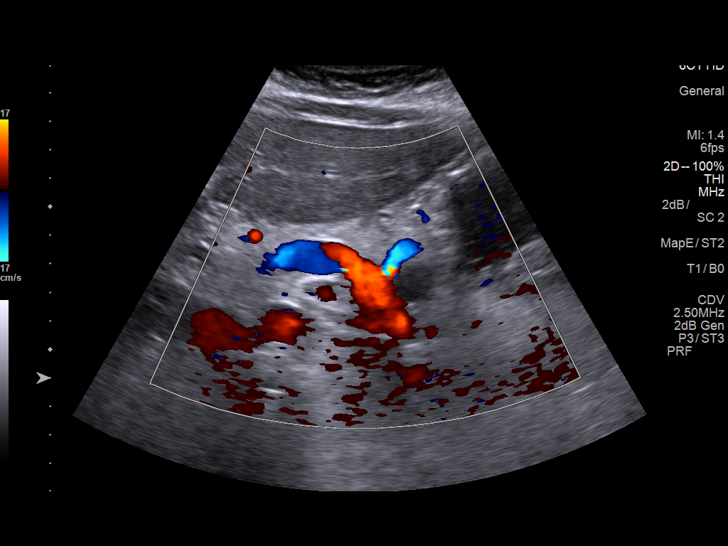
[im 51/102]
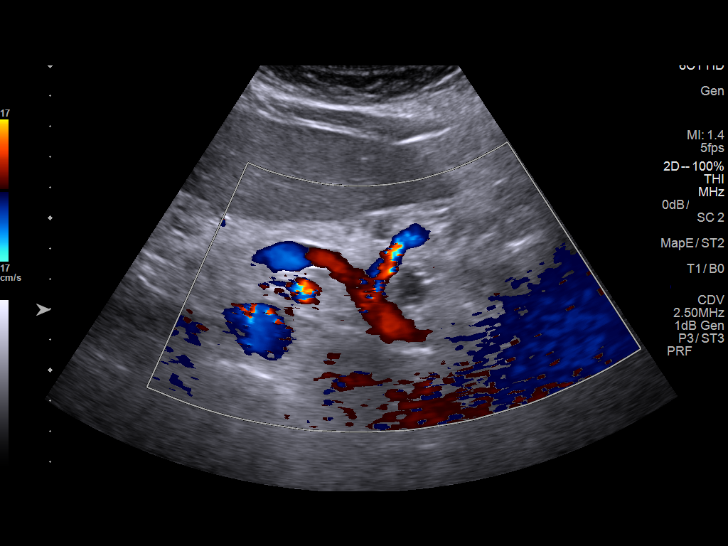
[im 59/102]
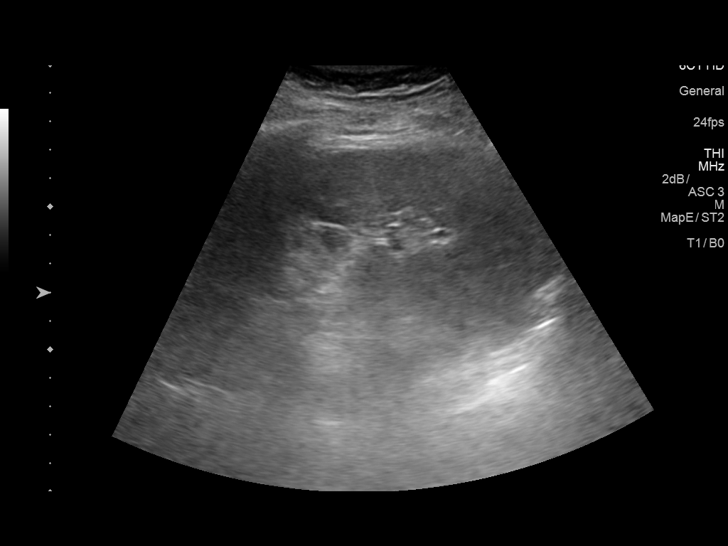
[im 68/102]
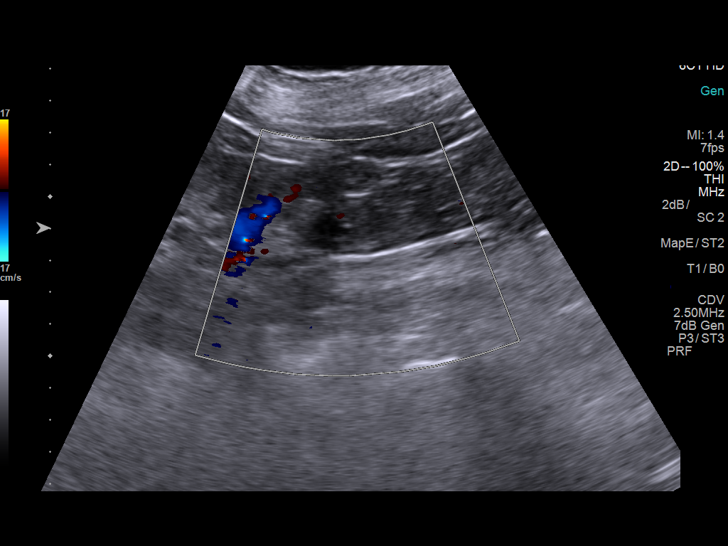
[im 76/102]
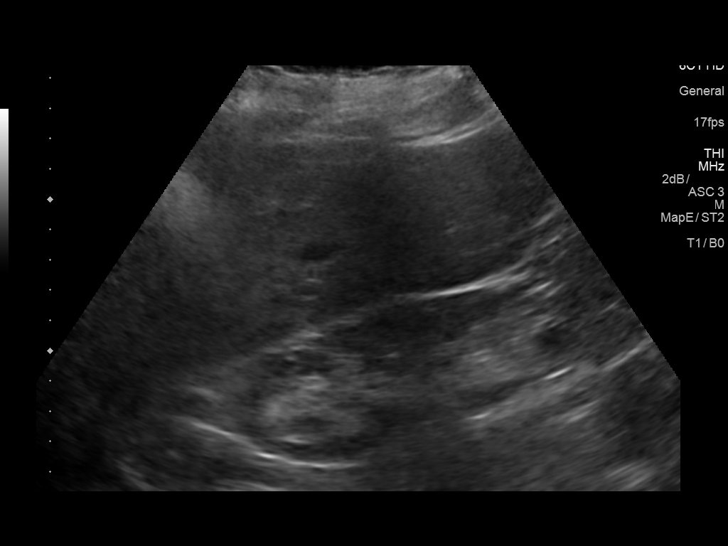
[im 85/102]
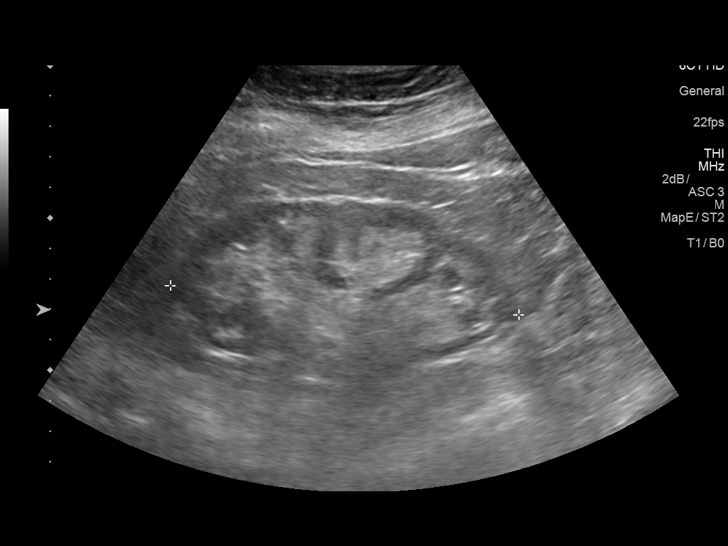
[im 93/102]
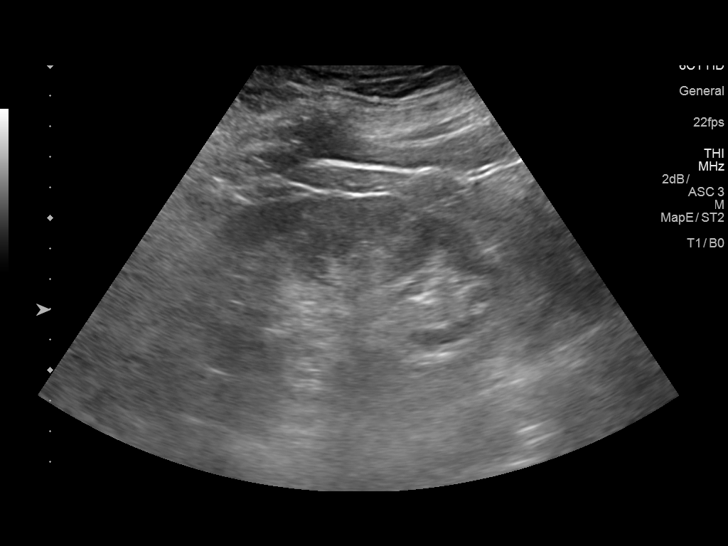
[im 102/102]
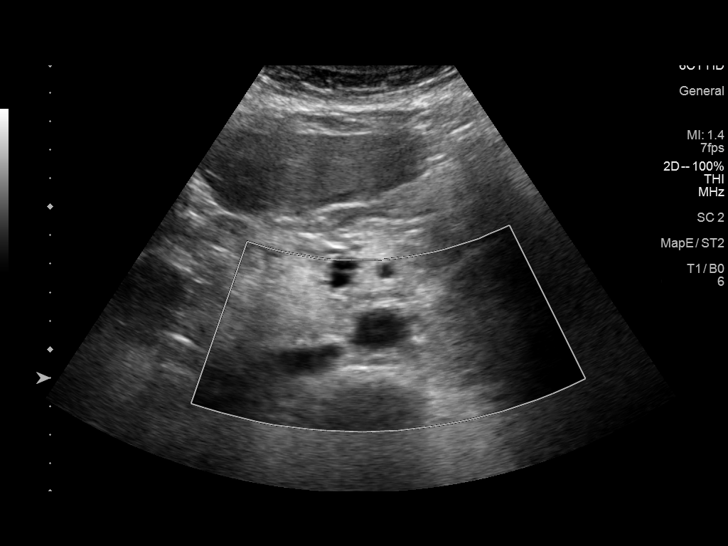

[13 of 25 positions shown; findings below may reference images not displayed]

FINDINGS: Gallbladder: Tiny polypoid densities noted gallbladder, largest
measures 6.5 mm. Findings consistent with gallbladder polyps. No
gallstones. No biliary distention. Gallbladder wall thickness
normal. Negative Murphy sign.

Common bile duct: Diameter: 4.4 mm

Liver: Increased hepatic echogenicity consistent fatty infiltration
and/or hepatocellular disease. Portal vein is patent on color
Doppler imaging with normal direction of blood flow towards the
liver.

IVC: No abnormality visualized.

Pancreas: 1.5 x 1.4 x 0.9 cm hypoechoic lesion noted in the tell the
pancreas. This could represent a pancreatic malignancy. MRI of the
abdomen can be obtained to further evaluate.

Spleen: Size and appearance within normal limits.

Right Kidney: Length: 11.7 cm. Cortical thinning. Echogenicity
within normal limits. 1.61.7 cm complex cysts right kidney. No
hydronephrosis visualized.

Left Kidney: Length: 11.5 cm. Cortical thinning. Echogenicity within
normal limits. No mass or hydronephrosis visualized.

Abdominal aorta: No aneurysm visualized.

Other findings: Difficult exam due to patient's body habitus.
IMPRESSION: 1. 1.5 cm lesion in the tell the pancreas. This could represent a
pancreatic malignancy. MRI of the abdomen suggested to further
evaluate.

2. Tiny polypoid densities in the gallbladder consistent gallbladder
polyps.

3. Increased hepatic echogenicity consistent fatty infiltration or
hepatocellular disease.

4. 1.6 and 1.7 cm complex cysts right kidney. These can be further
evaluated with MRI.

## 2019-11-08 MED ORDER — TECHNETIUM TC 99M MEBROFENIN IV KIT: 5.0000 | PACK | Freq: Once | INTRAVENOUS | Status: DC | PRN

## 2019-11-09 ENCOUNTER — Other Ambulatory Visit: Payer: Self-pay | Admitting: Family Medicine

## 2019-11-09 NOTE — Addendum Note (Signed)
Addended by: Eulas Post on: 11/09/2019 01:06 PM   Modules accepted: Orders

## 2019-11-12 ENCOUNTER — Encounter: Payer: Self-pay | Admitting: Family Medicine

## 2019-11-12 ENCOUNTER — Telehealth: Payer: Self-pay | Admitting: *Deleted

## 2019-11-12 NOTE — Telephone Encounter (Signed)
Here is latest information: and yes I would encourage her to get it.  We are committed to keeping you informed about the COVID-19 vaccine.  As the vaccine continues to become available for each phase, we will ensure that patients who meet the criteria receive the information they need to access vaccination opportunities. Continue to check your MyChart account and [email or web address] for updates. Please review the Phase 1b information below.  Following Brianna Mccarty's guidelines for the distribution of COVID-19 vaccines, we are pleased to share our plans to begin offering vaccines to those 75 and older (Phase 1b). Here are details of those plans:  [place] COVID-19 Vaccination Clinic Appointments required. Open to those age 80 or older Not restricted to [facility] or [facility] residents Location: [place], [place], [place] daily beginning: Saturday, November 13, 2019 Hours: 8 a.m. to 1 p.m. (10 a.m. to 2 p.m. this Saturday and Sunday, Jan. 9 and 10) Registration for vaccine clinic appointments is open as of 10 a.m., Friday, January 8 Please visit [email or web address] to register or call [phone] There will be no copay required for the vaccine. Insurance information will be requested if available.  [place] will offer this vaccination clinic through Sunday, January 17, after which we will switch to a larger location to offer public vaccination at a larger scale following state guidelines. We will provide additional information as details become available.   In addition to the clinic above, we are working in partnership with county health agencies in [place], [place], [place] and [place] counties to ensure continuing vaccination availability in alignment with state guidelines in the weeks and months ahead.   Information on phase 1b COVID-19 vaccination clinics being offered by local county health agencies is provided  on each county health department's website.  [place]'s phase 1b vaccination guidelines, prioritizing those 75 and over as the next eligible group to receive the COVID-19 vaccine, are detailed at [email or web address].   Additional information: Those 75 and older who register for [place]'s COVID-19 vaccination clinic at [facility] will be provided with additional information, including the need to be observed for 15 minutes following vaccination for your safety. Our COVID-19 testing clinic will not start at this site until 2 p.m. daily to ensure no people arriving for vaccination intersect with those seeking a COVID-19 test. [place] rooms at the [facility] are clean and safe, and our staff will use all appropriate protective equipment to keep you safe. This vaccine clinic will be located in a completely separate area of this facility than where health care is provided. No interaction will occur between patients or care teams at this site and our vaccination clinic.   As we proceed through phases of the vaccine rollout, we remind everyone to remain vigilant in practicing the 3 W's - wear a mask, wash your hands and wait 6 feet apart from others. These safety practices are based in science and are the best tool we have to reduce the spread of the virus.   For our most current information, please visit [email or web address].

## 2019-11-12 NOTE — Telephone Encounter (Signed)
Patient informed of the message below.  Patient wanted to know if HCTZ had polyethylene glycol in it or if the COVID vaccine has this in there as she was told that could cause a reaction if she was allergic.  Patient advised per Dr Ethlyn Gallery, she did not have a true allergic reaction to HCTZ as the medication can cause a low sodium level and Dr Ethlyn Gallery stated if she was allergic to polyethylene glycol she would not be able to tolerate bowel preps treatments,etc and the patient agreed.

## 2019-11-12 NOTE — Telephone Encounter (Signed)
Patient informed of the message below and gave her the number to call to register.  Patient stated she thought she had a reaction to HCTZ years ago and ended up in the ER and wanted to have Dr Ethlyn Gallery review her chart as she thought this dropped her sodium too low? Message sent to PCP.

## 2019-11-12 NOTE — Telephone Encounter (Signed)
She is correct - hyponatremia and ER visit in 06/2017 secondary to hctz. I added to allergies.

## 2019-11-12 NOTE — Telephone Encounter (Signed)
Copied from [place] [id number]. Topic: General - Inquiry >> Nov 12, 2019 12:05 PM Greggory Keen D wrote: Reason for CRM:  pt called wanting to know if she should get the covid vaccine.  She has allergies and asthma.  She wonders if it will be safe for her  CB#   [phone] or [phone]

## 2019-11-16 DIAGNOSIS — J301 Allergic rhinitis due to pollen: Secondary | ICD-10-CM | POA: Diagnosis not present

## 2019-11-16 DIAGNOSIS — J3089 Other allergic rhinitis: Secondary | ICD-10-CM | POA: Diagnosis not present

## 2019-11-16 DIAGNOSIS — K824 Cholesterolosis of gallbladder: Secondary | ICD-10-CM | POA: Diagnosis not present

## 2019-11-17 ENCOUNTER — Telehealth: Payer: Self-pay | Admitting: *Deleted

## 2019-11-17 NOTE — Telephone Encounter (Addendum)
   Primary Cardiologist: Ena Dawley, MD  Chart reviewed as part of pre-operative protocol coverage. Patient last seen by Melina Copa on 05/27/2019 for virtual visit at which time she was doing well from a cardiac standpoint. I called and spoke with patient on 11/16/2018 and she reports she is still doing well. No chest pain, shortness of breath, orthopnea, edema, palpitations, lightheadedness/dizziness, or syncope. She is able to complete > 4.0 METS without any anginal symptoms.  Patient has not had an EKG since 10/2018. Therefore, will have patient come into the office for an EKG in the next couple of days. Assuming there are no concerning ST/T changes or new arrhythmias, patient would be at acceptable risk for the planned procedure without further cardiovascular testing.    Pre-Op Covering Staff:  Can you please help arrange in-office EKG and notify patient of when she can come? Patient states she could come this Friday (11/19/2019) or next Monday (11/22/2019).   Thank you!  Darreld Mclean, PA-C 11/17/2019, 11:00 AM

## 2019-11-17 NOTE — Telephone Encounter (Signed)
   [facility] Pre-operative Risk Assessment    Request for surgical clearance:  1. What type of surgery is being performed? LAP CHOLE   2. When is this surgery scheduled? TBD   3. What type of clearance is required (medical clearance vs. Pharmacy clearance to hold med vs. Both)? MEDICAL  4. Are there any medications that need to be held prior to surgery and how long? NONE LISTED   5. Practice name and name of physician performing surgery? [facility]; DR. Harrell Gave WHITE   6. What is your office phone number [phone]    7.   What is your office fax number [phone]  8.   Anesthesia type (None, local, MAC, general) ? GENERAL   Brianna Mccarty 11/17/2019, 8:58 AM  _________________________________________________________________   (provider comments below)

## 2019-11-18 NOTE — Telephone Encounter (Signed)
I s/w pt today and informed her that we will need her to come in the office for an EKG for pre op clearance as her last EKG was 2019. Pt is agreeable to plan of care and will come in tomorrow 11/19/19 2 pm for EKG. Pt thanked me for the call and my help. Marland Kitchen

## 2019-11-19 ENCOUNTER — Other Ambulatory Visit: Payer: Self-pay

## 2019-11-19 ENCOUNTER — Ambulatory Visit (INDEPENDENT_AMBULATORY_CARE_PROVIDER_SITE_OTHER): Payer: PPO

## 2019-11-19 VITALS — BP 128/62 | HR 60 | Wt 184.8 lb

## 2019-11-19 DIAGNOSIS — K219 Gastro-esophageal reflux disease without esophagitis: Secondary | ICD-10-CM | POA: Diagnosis not present

## 2019-11-19 DIAGNOSIS — I779 Disorder of arteries and arterioles, unspecified: Secondary | ICD-10-CM

## 2019-11-19 DIAGNOSIS — H1045 Other chronic allergic conjunctivitis: Secondary | ICD-10-CM | POA: Diagnosis not present

## 2019-11-19 DIAGNOSIS — J301 Allergic rhinitis due to pollen: Secondary | ICD-10-CM | POA: Diagnosis not present

## 2019-11-19 DIAGNOSIS — I10 Essential (primary) hypertension: Secondary | ICD-10-CM | POA: Diagnosis not present

## 2019-11-19 DIAGNOSIS — J3089 Other allergic rhinitis: Secondary | ICD-10-CM | POA: Diagnosis not present

## 2019-11-19 NOTE — Patient Instructions (Signed)
Discharge Instructions   None       

## 2019-11-19 NOTE — Telephone Encounter (Signed)
   Primary Cardiologist: Ena Dawley, MD  Chart reviewed as part of pre-operative protocol coverage.  EKG reviewed today and reveals NSR without ischemic changes. Patient is cleared to proceed with planned cholecystectomy.  Per phone call with Sande Rives, PA-C below and based on ACC/AHA guidelines, Brianna Mccarty would be at acceptable risk for the planned procedure without further cardiovascular testing.   I will route this recommendation to the requesting party via Epic fax function and remove from pre-op pool.  Please call with questions.  Abigail Butts, PA-C 11/19/2019, 3:25 PM

## 2019-11-19 NOTE — Progress Notes (Signed)
1.) Reason for visit: ECG/ pre-op clearance  2.) Name of MD requesting visit: Melina Copa, PA*  3.) H&P: Pt scheduled for elective lab chole, last OV 05/27/19, ECG needed for pre-op clearance**  4.) ROS related to problem: No reported cardiac symptoms**  5.) Assessment and plan per MD: ECG completed and reviewed by Dr. Acie Fredrickson, DOD.  NSR noted, no changes.  *

## 2019-11-20 ENCOUNTER — Ambulatory Visit: Payer: Medicare Other | Attending: Internal Medicine

## 2019-11-20 DIAGNOSIS — Z23 Encounter for immunization: Secondary | ICD-10-CM | POA: Insufficient documentation

## 2019-11-20 NOTE — Progress Notes (Signed)
   Covid-19 Vaccination Clinic  Name:  Brianna Mccarty    MRN: [id number] DOB: [birth year]  11/20/2019  [name] was observed post Covid-19 immunization for 30 minutes based on pre-vaccination screening without incidence. She was provided with Vaccine Information Sheet and instruction to access the V-Safe system.   [name] was instructed to call 911 with any severe reactions post vaccine: Marland Kitchen Difficulty breathing  . Swelling of your face and throat  . A fast heartbeat  . A bad rash all over your body  . Dizziness and weakness    Immunizations Administered    Name Date Dose VIS Date Route   Pfizer COVID-19 Vaccine 11/20/2019  1:06 PM 0.3 mL 10/15/2019 Intramuscular   Manufacturer: [place]   Lot: [id number]   [place]: [id number]

## 2019-11-25 DIAGNOSIS — J3089 Other allergic rhinitis: Secondary | ICD-10-CM | POA: Diagnosis not present

## 2019-11-25 DIAGNOSIS — J301 Allergic rhinitis due to pollen: Secondary | ICD-10-CM | POA: Diagnosis not present

## 2019-11-30 DIAGNOSIS — H6123 Impacted cerumen, bilateral: Secondary | ICD-10-CM | POA: Diagnosis not present

## 2019-12-02 DIAGNOSIS — J3089 Other allergic rhinitis: Secondary | ICD-10-CM | POA: Diagnosis not present

## 2019-12-02 DIAGNOSIS — J301 Allergic rhinitis due to pollen: Secondary | ICD-10-CM | POA: Diagnosis not present

## 2019-12-03 ENCOUNTER — Ambulatory Visit: Payer: Self-pay | Admitting: Surgery

## 2019-12-03 NOTE — H&P (Signed)
CC: Referred by primary for right upper quadrant pain and abnormal HIDA scan  HPI: Brianna Mccarty is a very pleasant 79yoF with hx of HTN, HLD, asthma (whom takes daily inhalers) presents to our office as a referral after having a HIDA scan which demonstrated a gallbladder ejection fraction of 15%. Cystic and common bile ducts are patent. She reports a history of right upper quadrant pain that is a dull boring cramp that will last hours to days it has been going on for the last 14 months. This is exacerbated by greasy/fatty food. She reports associated nausea. She denies any history of yellowing of her skin or eyes.  She underwent a complete abdominal ultrasound 11/02/2018 which demonstrated a 1.5 cm lesion until the pancreas; tiny polypoid densities in the gallbladder consistent with gallbladder polyps; Fatty liver; 1.6 and 1.7 centimeter complex right kidney cysts  Subsequent abdominal MRI 11/20/2018 showed numerous pancreatic cystic lesions similar which are minimally complicated. No solid component or suspicious enhancement. Largest lesion is 1.9 cm and the tail. Felt to represent postinflammatory or indolent cystic neoplasm. Follow-up MRI 08/13/2019 showed stability of all these lesions. She is now undergoing serial MRIs for surveillance. Her most recent MRI did also demonstrate small gallbladder polyps. Given her symptoms she also had a HIDA scan done 11/08/2019 which showed the abnormal gallbladder ejection fraction of 15%. There was no evidence of chronic cholecystitis.  Her LFTs have been normal. Back in 2018, she had a mildly elevated lipase on 1 lab check of 57.  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 She reports she is happily retired.  ROS: A comprehensive 10 system review of systems was completed with the patient and pertinent findings as noted above.  The  patient is a 79 year old female.   Past Surgical History Geni Bers Edson, RMA; 11/16/2019 11:46 AM) Breast Biopsy  Left. Cataract Surgery  Bilateral. Oral Surgery   Diagnostic Studies History Geni Bers [place], RMA; 11/16/2019 11:46 AM) Colonoscopy  1-5 years ago Mammogram  within last year Pap Smear  1-5 years ago  Allergies (Chanel Teressa Senter, CMA; 11/16/2019 11:16 AM) Penicillin G Benzathine & Proc *PENICILLINS*  Cephalexin *CEPHALOSPORINS*  PHENobarbital *HYPNOTICS/SEDATIVES/SLEEP DISORDER AGENTS*  Tape 1"X5yd *MEDICAL DEVICES AND SUPPLIES*  Allergies Reconciled   Medication History (Chanel Teressa Senter, CMA; 11/16/2019 11:19 AM) amLODIPine Besylate (10MG  Tablet, Oral) Active. Albuterol Sulfate HFA (108 (90 Base)MCG/ACT Aerosol Soln, Inhalation) Active. Omeprazole (40MG  Capsule DR, Oral) Active. Elmiron (100MG  Capsule, Oral) Active. hydrALAZINE HCl (50MG  Tablet, Oral) Active. Losartan Potassium (100MG  Tablet, Oral) Active. Pravastatin Sodium (20MG  Tablet, Oral) Active. Acyclovir (400MG  Tablet, Oral) Active. Desmopressin Acetate (0.2MG  Tablet, Oral) Active. Nexletol (180MG  Tablet, Oral) Active. Allegra (Oral) Specific strength unknown - Active. hydrOXYzine HCl (10MG  Tablet, Oral) Active. ProAir HFA (108 (90 Base)MCG/ACT Aerosol Soln, Inhalation) Active. Medications Reconciled  Social History Geni Bers [place], RMA; 11/16/2019 11:46 AM) Alcohol use  Moderate alcohol use. Caffeine use  Carbonated beverages. No drug use  Tobacco use  Never smoker.  Family History Geni Bers Wood[place], RMA; 11/16/2019 11:46 AM) Arthritis  Mother. Hypertension  Brother, Daughter, Mother, Son. Malignant Neoplasm Of Pancreas  Father.  Pregnancy / Birth History Geni Bers Lloyd, RMA; 11/16/2019 11:46 AM) Age at menarche  25 years. Age of menopause  17-50 Contraceptive History  Oral contraceptives. Gravida  2 Maternal age  67-25 Para  1  Other Problems  Geni Bers Cuartelez, RMA; 11/16/2019 11:46 AM)  Arthritis  Asthma  Back Pain  Bladder Problems  Diverticulosis  Gastroesophageal Reflux Disease  Heart murmur  Hemorrhoids  High blood pressure  Hypercholesterolemia  Kidney Stone  Melanoma  Sleep Apnea     Review of Systems Geni Bers Haggett RMA; 11/16/2019 11:46 AM) General Not Present- Appetite Loss, Chills, Fatigue, Fever, Night Sweats, Weight Gain and Weight Loss. Skin Not Present- Change in Wart/Mole, Dryness, Hives, Jaundice, New Lesions, Non-Healing Wounds, Rash and Ulcer. HEENT Present- Hearing Loss, Ringing in the Ears and Seasonal Allergies. Not Present- Earache, Hoarseness, Nose Bleed, Oral Ulcers, Sinus Pain, Sore Throat, Visual Disturbances, Wears glasses/contact lenses and Yellow Eyes. Respiratory Present- Wheezing. Not Present- Bloody sputum, Chronic Cough, Difficulty Breathing and Snoring. Cardiovascular Present- Shortness of Breath. Not Present- Chest Pain, Difficulty Breathing Lying Down, Leg Cramps, Palpitations, Rapid Heart Rate and Swelling of Extremities. Gastrointestinal Present- Abdominal Pain, Bloating, Change in Bowel Habits, Constipation, Excessive gas, Gets full quickly at meals, Hemorrhoids, Indigestion and Nausea. Not Present- Bloody Stool, Chronic diarrhea, Difficulty Swallowing, Rectal Pain and Vomiting. Musculoskeletal Present- Back Pain and Joint Pain. Not Present- Joint Stiffness, Muscle Pain, Muscle Weakness and Swelling of Extremities. Hematology Present- Easy Bruising. Not Present- Blood Thinners, Excessive bleeding, Gland problems, HIV and Persistent Infections.  Vitals (Chanel Nolan CMA; 11/16/2019 11:19 AM) 11/16/2019 11:19 AM Weight: 185.13 lb Height: 60in Body Surface Area: 1.81 m Body Mass Index: 36.15 kg/m  Temp.: 98.57F  Pulse: 80 (Regular)        Physical Exam Harrell Gave M. Sheamus Hasting MD; 11/16/2019 12:06 PM) The physical exam findings are as  follows: Note:Constitutional: No acute distress; conversant; wearing mask Eyes: Moist conjunctiva; no lid lag; anicteric sclerae; pupils equal and round Lungs: Normal respiratory effort CV: rrr GI: Abdomen soft, nontender, nondistended; no palpable hepatosplenomegaly MSK: Normal gait Psychiatric: Appropriate affect    Assessment & Plan Harrell Gave M. Nashaun Hillmer MD; 11/16/2019 12:13 PM) GALLBLADDER POLYP (K82.4) Story: Ms. Nuernberger is a very pleasant 50yoF with hx of HTN, HLD, asthma here for evaluation of crampy colicky RUQ pain that lasts hours to days for last 14 mo. Workup has shown stable pancreatic cysts on abdominal MRI. -We discussed that gallbladder polyps when tiny often represent cholesterolosis deposits or could actually be polyps - but regardless with symptoms, recommend surgery to remove the gallbladder -Reviewed case with our [facility] surgeon as well - long-term, following gallbladder removal, will need follow-up with her for surveillance Impression: -The anatomy and physiology of the hepatobiliary system was discussed at length with the patient with associated pictures. The pathophysiology of gallbladder disease was discussed at length with associated pictures; all this done without laparoscopic cholecystectomy handout -We discussed some of her symptoms including fact that she is brought some issues in the past with constipation and excess amounts of gas being unlikely to improve following surgery. We specifically discussed that the right upper quadrant pain would be the most likely thing to improve with surgical removal of gallbladder. -The options for treatment were discussed including ongoing observation which may result in subsequent gallbladder complications (infection, pancreatitis, choledocholithiasis, etc). We also reviewed surgery - cholecystectomy - laparoscopic and potential open techniques -The planned procedure, material risks (including, but not limited to, pain, bleeding,  infection, scarring, need for blood transfusion, damage to surrounding structures- blood vessels/nerves/viscus/organs, damage to bile duct, bile leak, need for additional procedures, incomplete resolution of abdominal symptoms, hernia, pancreatitis, pneumonia, heart attack, stroke, death) benefits and alternatives to surgery were discussed at length. I noted a good probability that the procedure would help  improve their symptoms. The patient's questions were answered to their satisfaction, they voiced understanding and they elected to proceed with surgery. Additionally, we discussed typical postoperative expectations and the recovery process.  This patient encounter took 42 minutes today to perform the following: take history, perform exam, review outside records, interpret imaging, counsel the patient on their diagnosis and document encounter, findings & plan in the EHR  Signed by Ileana Roup, MD (11/16/2019 12:13 PM)

## 2019-12-05 ENCOUNTER — Ambulatory Visit
Admission: EM | Admit: 2019-12-05 | Discharge: 2019-12-05 | Disposition: A | Discharge status: Home / Self Care | Payer: PPO | Attending: Emergency Medicine | Admitting: Emergency Medicine

## 2019-12-05 ENCOUNTER — Other Ambulatory Visit: Payer: Self-pay

## 2019-12-05 ENCOUNTER — Encounter: Payer: Self-pay | Admitting: Emergency Medicine

## 2019-12-05 DIAGNOSIS — L309 Dermatitis, unspecified: Secondary | ICD-10-CM

## 2019-12-05 MED ORDER — TRIAMCINOLONE ACETONIDE 0.5 % EX OINT: 1.0000 "application " | TOPICAL_OINTMENT | Freq: Two times a day (BID) | CUTANEOUS | 0 refills | Status: DC

## 2019-12-05 MED ORDER — MICONAZOLE NITRATE 2 % EX POWD: CUTANEOUS | 0 refills | Status: DC | PRN

## 2019-12-05 NOTE — Discharge Instructions (Addendum)
Avoid hot water as this can further irritate skin. Use triamcinolone once a day. Use powder to keep area dry. Return for worsening rash, pain, fever, aches.

## 2019-12-05 NOTE — ED Provider Notes (Signed)
[name]    CSN: [id number] Arrival date & time: 12/05/19  1240      History   Chief Complaint Chief Complaint  Patient presents with  . Rash    HPI Brianna Mccarty is a 79 y.o. female presenting for red, itchy rash under right breast.  States she had an EKG 2 to 3 weeks ago and noticed a little spot thereafter.  Patient felt this was from the adhesive, so "did not think anything of it".  Patient states that the rash since it has grown bigger and become more itchy.  Patient states that she had shingles in September 2020, concerned this may be similar.  Patient denies pain, burning.  States the rash is their first and is itchy.  Has tried keeping area clean and dry without significant relief.  No fevers, chills, arthralgias, myalgias.   Past Medical History:  Diagnosis Date  . Abnormal EKG    Normal LV function in the past  . Allergic rhinitis   . Asthma   . Bronchitis, mucopurulent recurrent ([place])   . Carotid artery disease ([facility])    a. mild by carotid duplex.  . Cervical dysplasia 1971  . Diverticulosis of colon   . DJD (degenerative joint disease)   . GERD (gastroesophageal reflux disease)   . Headache(784.0)   . Hypercholesterolemia   . Hypertension   . Interstitial cystitis    sees urologist  . Lichen sclerosus   . Malignant melanoma ([place])    sees Dr. Nevada Crane in dermatology  . Migraines   . Mild aortic stenosis   . Mitral valve disease    Question mitral valve prolapse in the past, no prolapse by echo 2009  . Murmur 10/20/2015  . OSA (obstructive sleep apnea)   . Osteoporosis    on fosomax > 5 years, stopped 11/2015  . Renal calculus    sees urologist  . Thyroid cyst    1 x 1.1 thyroid cyst noted on carotid Doppler, January, 2012  . Venous insufficiency     Patient Active Problem List   Diagnosis Date Noted  . Macular degeneration 02/17/2019  . OSA (obstructive sleep apnea) 02/12/2018  . Hyperglycemia 02/12/2018  . H/O cold sores 11/27/2015    . Murmur 10/20/2015  . Carotid artery disease ([place])   . Thyroid cyst   . Venous (peripheral) insufficiency 06/02/2009  . Asthma 04/27/2008  . Melanoma of skin ([place]) 02/10/2008  . Allergic rhinitis 02/10/2008  . INTERSTITIAL CYSTITIS 02/10/2008  . HYPERCHOLESTEROLEMIA 02/09/2008  . Essential hypertension 02/09/2008  . GERD 02/09/2008  . Osteoporosis 02/09/2008    Past Surgical History:  Procedure Laterality Date  . BREAST BIOPSY Left 11/25/2011   U/S core, [facility]  . BREAST CYST ASPIRATION    . BREAST SURGERY  2013   Breast Bx-Benign  . CARDIAC CATHETERIZATION    . CATARACT EXTRACTION, BILATERAL    . cystoscopy and basket stone removal right ureter  02/2006   Dr. Terance Hart  . GYNECOLOGIC CRYOSURGERY  1971  . left total hip replacement  2004   Dr. Gladstone Lighter  . melanoma removed from medial rleft knee area  2006   Dr. Nevada Crane  . NASAL SEPTUM SURGERY      OB History    Gravida  2   Para  2   Term  2   Preterm      AB      Living  2     SAB  TAB      Ectopic      Multiple      Live Births               Home Medications    Prior to Admission medications   Medication Sig Start Date End Date Taking? Authorizing Provider  albuterol (PROAIR HFA) 108 (90 Base) MCG/ACT inhaler Inhale 2 puffs into the lungs every 6 (six) hours as needed for wheezing. 09/10/18   Noralee Space, MD  albuterol (PROVENTIL) (2.5 MG/3ML) 0.083% nebulizer solution Take 3 mLs (2.5 mg total) by nebulization every 6 (six) hours as needed. 09/10/18 07/09/29  Noralee Space, MD  amLODipine (NORVASC) 10 MG tablet TAKE 1 TABLET BY MOUTH EVERY DAY 11/08/19   Caren Macadam, MD  Ascorbic Acid (VITAMIN C) 500 MG tablet Take 500 mg by mouth daily.      [provider]  aspirin 81 MG tablet Take 81 mg by mouth daily.      [provider]  aspirin-acetaminophen-caffeine (EXCEDRIN MIGRAINE) [phone] MG tablet Take by mouth every 6 (six) hours as needed for  headache.    [provider]  azelastine (ASTELIN) 0.1 % nasal spray Instill 2 sprays into each nostril two times a day as needed for allergies 11/19/16   [provider]  Bempedoic Acid (NEXLETOL) 180 MG TABS Take 1 tablet by mouth daily. 06/04/19   Dorothy Spark, MD  budesonide-formoterol [facility]) 160-4.5 MCG/ACT inhaler Inhale 2 puffs 2 (two) times daily into the lungs. 09/10/17   Noralee Space, MD  Calcium Carbonate (CALCIUM 600) 1500 MG TABS Take 1 tablet by mouth daily.      [provider]  Cholecalciferol (VITAMIN D3) 5000 UNITS CAPS Take 5,000 Units by mouth daily.     [provider]  Cinnamon 500 MG TABS Take 1 tablet by mouth daily.     [provider]  clindamycin (CLEOCIN) 300 MG capsule 1 CAPSULE IN THE MORNING 1 CAPSULE 1HOUR BEFORE PROCEDURE 1 CAPSULE 1 HOUR AFTER 1 CAPSULE AT [facility] 04/07/19   [provider]  Coenzyme Q10 (CO Q-10) 200 MG CAPS Take 200 mg by mouth daily. 05/25/18   Sueanne Margarita, MD  desmopressin (DDAVP) 0.2 MG tablet Take 0.6 mg by mouth at bedtime.     [provider]  EPINEPHrine 0.3 mg/0.3 mL IJ SOAJ injection See admin instructions. 12/21/18   [provider]  fexofenadine (ALLEGRA) 180 MG tablet Take 180 mg by mouth daily as needed for allergies or rhinitis.    [provider]  furosemide (LASIX) 20 MG tablet TAKE 1 TABLET BY MOUTH EVERY DAY 08/12/19   Dorothy Spark, MD  hydrALAZINE (APRESOLINE) 50 MG tablet TAKE 1 TABLET BY MOUTH 3 TIMES A DAY AND TAKE EXTRA TABLET IF SBP>160 10/23/19   Josue Hector, MD  hydrOXYzine (ATARAX/VISTARIL) 10 MG tablet Take 10 mg by mouth daily.    [provider]  losartan (COZAAR) 100 MG tablet TAKE 1 TABLET BY MOUTH EVERY DAY *NEEDS APPT* 10/19/19   Caren Macadam, MD  magnesium oxide (MAG-OX) 400 MG tablet Take 400 mg by mouth daily.      [provider]  miconazole (MICOTIN) 2 % powder Apply topically as needed  for itching. 12/05/19   Hall-Potvin, [place], PA-C  Multiple Vitamins-Minerals (ICAPS AREDS 2 PO) Take by mouth daily.    [provider]  omeprazole (PRILOSEC) 40 MG capsule TAKE 1 CAPSULE BY MOUTH EVERY DAY.**PATIENT  NEEDS APPT PER DOCTOR** 10/27/19   Koberlein, Steele Berg, MD  pentosan polysulfate (ELMIRON) 100 MG capsule Take 100 mg by mouth 3 (three) times daily before meals.      [provider]  pravastatin (PRAVACHOL) 20 MG tablet Take 20 mg by mouth 2 (two) times a week.    [provider]  prazosin (MINIPRESS) 5 MG capsule TAKE 1 CAPSULE BY MOUTH TWICE A DAY 06/25/19   Koberlein, Steele Berg, MD  Probiotic Product (PROBIOTIC PO) Take 1 capsule by mouth daily.     [provider]  triamcinolone ointment (KENALOG) 0.5 % Apply 1 application topically 2 (two) times daily. 12/05/19   Hall-Potvin, [place], PA-C    Family History Family History  Problem Relation Age of Onset  . Cancer Father        Pancreatic  . Diabetes Mother   . Heart disease Mother   . Cancer Brother        Bile duct  . Diabetes Brother   . Hypertension Brother   . Diabetes Brother   . Heart disease Brother   . Hypertension Brother   . Hypertension Sister   . Hypertension Sister   . Colon cancer Paternal Uncle     Social History Social History   Tobacco Use  . Smoking status: Never Smoker  . Smokeless tobacco: Never Used  Substance Use Topics  . Alcohol use: Yes    Alcohol/week: 1.0 standard drinks    Types: 1 Standard drinks or equivalent per week    Comment: social use  . Drug use: No     Allergies   Cephalexin, Hydrochlorothiazide, Penicillins, Phenobarbital, Rosuvastatin, and Tape   Review of Systems As per HPI   Physical Exam Triage Vital Signs ED Triage Vitals  Enc Vitals Group     BP 12/05/19 1246 (!) 146/72     Pulse Rate 12/05/19 1246 88     Resp 12/05/19 1246 18     Temp 12/05/19 1246 98.2 F (36.8 C)     Temp Source 12/05/19 1246 Oral      SpO2 12/05/19 1246 96 %     Weight --      Height --      Head Circumference --      Peak Flow --      Pain Score 12/05/19 1251 0     Pain Loc --      Pain Edu? --      Excl. in [place]? --    No data found.  Updated Vital Signs BP (!) 146/72 (BP Location: Left Arm)   Pulse 88   Temp 98.2 F (36.8 C) (Oral)   Resp 18   SpO2 96%   Visual Acuity Right Eye Distance:   Left Eye Distance:   Bilateral Distance:    Right Eye Near:   Left Eye Near:    Bilateral Near:     Physical Exam Constitutional:      General: She is not in acute distress. HENT:     Head: Normocephalic and atraumatic.  Eyes:     General: No scleral icterus.    Pupils: Pupils are equal, round, and reactive to light.  Cardiovascular:     Rate and Rhythm: Normal rate.  Pulmonary:     Effort: Pulmonary effort is normal.  Skin:    Coloration: Skin is not jaundiced or pale.          Comments: nontender  Neurological:     Mental Status: She is  alert and oriented to person, place, and time.      UC Treatments / Results  Labs (all labs ordered are listed, but only abnormal results are displayed) Labs Reviewed - No data to display  EKG   Radiology No results found.  Procedures Procedures (including critical care time)  Medications Ordered in UC Medications - No data to display  Initial Impression / Assessment and Plan / UC Course  I have reviewed the triage vital signs and the nursing notes.  Pertinent labs & imaging results that were available during my care of the patient were reviewed by me and considered in my medical decision making (see chart for details).     Patient afebrile, nontoxic in office today.  Low concern for shingles at this time given appearance of rash.  Likely dermatitis: We will trial triamcinolone, add miconazole to help keep area dry/provide antifungal coverage.  Patient to follow-up with PCP in 1 week as needed for repeat evaluation, or here sooner if developing  worsening symptoms.  Return precautions discussed, patient verbalized understanding and is agreeable to plan. Final Clinical Impressions(s) / UC Diagnoses   Final diagnoses:  Dermatitis     Discharge Instructions     Avoid hot water as this can further irritate skin. Use triamcinolone once a day. Use powder to keep area dry. Return for worsening rash, pain, fever, aches.    ED Prescriptions    Medication Sig Dispense Auth. Provider   miconazole (MICOTIN) 2 % powder Apply topically as needed for itching. 70 g Hall-Potvin, [place], PA-C   triamcinolone ointment (KENALOG) 0.5 % Apply 1 application topically 2 (two) times daily. 30 g Hall-Potvin, [place], PA-C     PDMP not reviewed this encounter.   Hall-Potvin, [place], [place] 12/08/19 2016

## 2019-12-05 NOTE — ED Triage Notes (Signed)
Pt presents to [facility] for assessment of rash developing under her left breast, which she noted last night.  Patient states hx of shingles and concerned for that.

## 2019-12-06 DIAGNOSIS — J301 Allergic rhinitis due to pollen: Secondary | ICD-10-CM | POA: Diagnosis not present

## 2019-12-06 DIAGNOSIS — J3089 Other allergic rhinitis: Secondary | ICD-10-CM | POA: Diagnosis not present

## 2019-12-08 ENCOUNTER — Ambulatory Visit: Payer: PPO

## 2019-12-08 ENCOUNTER — Ambulatory Visit: Payer: PPO | Attending: Internal Medicine

## 2019-12-08 DIAGNOSIS — Z23 Encounter for immunization: Secondary | ICD-10-CM

## 2019-12-08 NOTE — Progress Notes (Signed)
   Covid-19 Vaccination Clinic  Name:  Brianna Mccarty    MRN: [id number] DOB: 11-Apr-1941  12/08/2019  Brianna Mccarty was observed post Covid-19 immunization for 30 minutes based on pre-vaccination screening without incidence. She was provided with Vaccine Information Sheet and instruction to access the V-Safe system.   Brianna Mccarty was instructed to call 911 with any severe reactions post vaccine: Marland Kitchen Difficulty breathing  . Swelling of your face and throat  . A fast heartbeat  . A bad rash all over your body  . Dizziness and weakness

## 2019-12-10 ENCOUNTER — Encounter: Payer: PPO | Admitting: Obstetrics and Gynecology

## 2019-12-13 ENCOUNTER — Ambulatory Visit (INDEPENDENT_AMBULATORY_CARE_PROVIDER_SITE_OTHER): Payer: PPO

## 2019-12-13 VITALS — BP 126/55 | Ht 62.0 in | Wt 184.0 lb

## 2019-12-13 DIAGNOSIS — Z780 Asymptomatic menopausal state: Secondary | ICD-10-CM | POA: Diagnosis not present

## 2019-12-13 DIAGNOSIS — Z13820 Encounter for screening for osteoporosis: Secondary | ICD-10-CM | POA: Diagnosis not present

## 2019-12-13 DIAGNOSIS — Z0000 Encounter for general adult medical examination without abnormal findings: Secondary | ICD-10-CM | POA: Diagnosis not present

## 2019-12-13 NOTE — Progress Notes (Signed)
This visit is being conducted via phone call due to the COVID-19 pandemic. This patient has given me verbal consent via phone to conduct this visit, patient states they are participating from their home address. Some vital signs may be absent or patient reported.   Patient identification: identified by name, DOB, and current address.  Location provider: [facility], Office Persons participating in the virtual visit: Mrs. Ahmiracle Upchurch and Franne Forts, LPN.    Subjective:   Brianna Mccarty is a 79 y.o. female who presents for Medicare Annual (Subsequent) preventive examination.  Mrs. Matthias states that she is scheduled for a cholecystectomy on 12/15/19. She is using a treadmill several days a week, drinking plenty of water, but only eating 1 small meal and 1 larger meal daily right now.   Review of Systems:   Cardiac Risk Factors include: advanced age (>36men, >70 women);hypertension     Objective:     Vitals: BP (!) 126/55 Comment: patient reported  Ht 5\' 2"  (1.575 m)   Wt 184 lb (83.5 kg)   BMI 33.65 kg/m   Body mass index is 33.65 kg/m.  Advanced Directives 12/13/2019 12/08/2018 10/22/2017 05/24/2017 05/24/2017 03/07/2017 12/13/2016  Does Patient Have a Medical Advance Directive? Yes Yes Yes - Yes Yes Yes  Type of Advance Directive - [place];Living will - - [place];Living will [place];Living will Living will  Does patient want to make changes to medical advance directive? No - Patient declined No - Patient declined - - No - Patient declined No - Patient declined -  Copy of [place] in Chart? - No - copy requested - No - copy requested No - copy requested - -  Would patient like information on creating a medical advance directive? - - - - - - -    Tobacco Social History   Tobacco Use  Smoking Status Never Smoker  Smokeless Tobacco Never Used     Counseling given: Not Answered   Clinical  Intake:  Pre-visit preparation completed: Yes  Pain : No/denies pain     BMI - recorded: 33.65 Nutritional Status: BMI > 30  Obese Nutritional Risks: None Diabetes: No  How often do you need to have someone help you when you read instructions, pamphlets, or other written materials from your doctor or pharmacy?: 1 - Never  Interpreter Needed?: No  Information entered by :: Franne Forts, LPN.  Past Medical History:  Diagnosis Date  . Abnormal EKG    Normal LV function in the past  . Allergic rhinitis   . Asthma   . Bronchitis, mucopurulent recurrent ([place])   . Carotid artery disease (HCC)    a. mild by carotid duplex.  . Cervical dysplasia 1971  . Diverticulosis of colon   . DJD (degenerative joint disease)   . GERD (gastroesophageal reflux disease)   . Headache(784.0)   . Hypercholesterolemia   . Hypertension   . Interstitial cystitis    sees urologist  . Lichen sclerosus   . Malignant melanoma ([place])    sees Dr. Nevada Crane in dermatology  . Migraines   . Mild aortic stenosis   . Mitral valve disease    Question mitral valve prolapse in the past, no prolapse by echo 2009  . Murmur 10/20/2015  . OSA (obstructive sleep apnea)   . Osteoporosis    on fosomax > 5 years, stopped 11/2015  . Renal calculus    sees urologist  . Thyroid cyst  1 x 1.1 thyroid cyst noted on carotid Doppler, January, 2012  . Venous insufficiency    Past Surgical History:  Procedure Laterality Date  . BREAST BIOPSY Left 11/25/2011   U/S core, benign [facility]  . BREAST CYST ASPIRATION    . BREAST SURGERY  2013   Breast Bx-Benign  . CARDIAC CATHETERIZATION    . CATARACT EXTRACTION, BILATERAL    . cystoscopy and basket stone removal right ureter  02/2006   Dr. Terance Hart  . GYNECOLOGIC CRYOSURGERY  1971  . left total hip replacement  2004   Dr. Gladstone Lighter  . melanoma removed from medial rleft knee area  2006   Dr. Nevada Crane  . NASAL SEPTUM SURGERY     Family History  Problem  Relation Age of Onset  . Cancer Father        Pancreatic  . Diabetes Mother   . Heart disease Mother   . Cancer Brother        Bile duct  . Diabetes Brother   . Hypertension Brother   . Diabetes Brother   . Heart disease Brother   . Hypertension Brother   . Hypertension Sister   . Hypertension Sister   . Colon cancer Paternal Uncle    Social History   Socioeconomic History  . Marital status: Married    Spouse name: Edison Nasuti  . Number of children: 2  . Years of education: Not on file  . Highest education level: Not on file  Occupational History  . Occupation: retired    Fish farm manager: RETIRED  Tobacco Use  . Smoking status: Never Smoker  . Smokeless tobacco: Never Used  Substance and Sexual Activity  . Alcohol use: Yes    Alcohol/week: 1.0 standard drinks    Types: 1 Standard drinks or equivalent per week    Comment: social use  . Drug use: No  . Sexual activity: Not Currently    Birth control/protection: Post-menopausal    Comment: 1st intercourse 3 yo-1 partner  Other Topics Concern  . Not on file  Social History Narrative   Updated 12/13/2019   Work or School: none      Home Situation: lives with husband      Spiritual Beliefs: christian      Lifestyle: regular exercise; healthy diet      12/13/2019: Uses treadmill 3/x weekly.    Looks after sister who has been having memory decline, as well  as husband with minor memory decline.    Enjoys writing poetry, reading, going to church, travel   Social Determinants of Health   Financial Resource Strain: Low Risk   . Difficulty of Paying Living Expenses: Not hard at all  Food Insecurity: No Food Insecurity  . Worried About Charity fundraiser in the Last Year: Never true  . Ran Out of Food in the Last Year: Never true  Transportation Needs: No Transportation Needs  . Lack of Transportation (Medical): No  . Lack of Transportation (Non-Medical): No  Physical Activity: Sufficiently Active  . Days of Exercise per Week:  3 days  . Minutes of Exercise per Session: 50 min  Stress: No Stress Concern Present  . Feeling of Stress : Only a little  Social Connections: Not Isolated  . Frequency of Communication with Friends and Family: More than three times a week  . Frequency of Social Gatherings with Friends and Family: Once a week  . Attends Religious Services: More than 4 times per year  . Active Member of  Clubs or Organizations: Yes  . Attends Archivist Meetings: More than 4 times per year  . Marital Status: Married    Outpatient Encounter Medications as of 12/13/2019  Medication Sig  . albuterol (PROAIR HFA) 108 (90 Base) MCG/ACT inhaler Inhale 2 puffs into the lungs every 6 (six) hours as needed for wheezing.  Marland Kitchen albuterol (PROVENTIL) (2.5 MG/3ML) 0.083% nebulizer solution Take 3 mLs (2.5 mg total) by nebulization every 6 (six) hours as needed.  Marland Kitchen amLODipine (NORVASC) 10 MG tablet TAKE 1 TABLET BY MOUTH EVERY DAY  . Ascorbic Acid (VITAMIN C) 500 MG tablet Take 500 mg by mouth daily.    Marland Kitchen aspirin 81 MG tablet Take 81 mg by mouth daily.    Marland Kitchen aspirin-acetaminophen-caffeine (EXCEDRIN MIGRAINE) 250-250-65 MG tablet Take by mouth every 6 (six) hours as needed for headache.  Marland Kitchen azelastine (ASTELIN) 0.1 % nasal spray Instill 2 sprays into each nostril two times a day as needed for allergies  . Bempedoic Acid (NEXLETOL) 180 MG TABS Take 1 tablet by mouth daily.  . budesonide-formoterol (SYMBICORT) 160-4.5 MCG/ACT inhaler Inhale 2 puffs 2 (two) times daily into the lungs.  . Calcium Carbonate (CALCIUM 600) 1500 MG TABS Take 1 tablet by mouth daily.    . Cholecalciferol (VITAMIN D3) 5000 UNITS CAPS Take 5,000 Units by mouth daily.   . Cinnamon 500 MG TABS Take 1 tablet by mouth daily.   . clindamycin (CLEOCIN) 300 MG capsule 1 CAPSULE IN THE MORNING 1 CAPSULE 1HOUR BEFORE PROCEDURE 1 CAPSULE 1 HOUR AFTER 1 CAPSULE AT DINNER  . Coenzyme Q10 (CO Q-10) 200 MG CAPS Take 200 mg by mouth daily.  Marland Kitchen desmopressin  (DDAVP) 0.2 MG tablet Take 0.6 mg by mouth at bedtime.   Marland Kitchen EPINEPHrine 0.3 mg/0.3 mL IJ SOAJ injection See admin instructions.  . fexofenadine (ALLEGRA) 180 MG tablet Take 180 mg by mouth daily as needed for allergies or rhinitis.  . furosemide (LASIX) 20 MG tablet TAKE 1 TABLET BY MOUTH EVERY DAY  . hydrALAZINE (APRESOLINE) 50 MG tablet TAKE 1 TABLET BY MOUTH 3 TIMES A DAY AND TAKE EXTRA TABLET IF SBP>160  . hydrOXYzine (ATARAX/VISTARIL) 10 MG tablet Take 10 mg by mouth daily.  Marland Kitchen losartan (COZAAR) 100 MG tablet TAKE 1 TABLET BY MOUTH EVERY DAY *NEEDS APPT*  . magnesium oxide (MAG-OX) 400 MG tablet Take 400 mg by mouth daily.    . Multiple Vitamins-Minerals (ICAPS AREDS 2 PO) Take by mouth daily.  Marland Kitchen omeprazole (PRILOSEC) 40 MG capsule TAKE 1 CAPSULE BY MOUTH EVERY DAY.**PATIENT NEEDS APPT PER DOCTOR**  . pentosan polysulfate (ELMIRON) 100 MG capsule Take 100 mg by mouth 3 (three) times daily before meals.    . prazosin (MINIPRESS) 5 MG capsule TAKE 1 CAPSULE BY MOUTH TWICE A DAY  . Probiotic Product (PROBIOTIC PO) Take 1 capsule by mouth daily.   . miconazole (MICOTIN) 2 % powder Apply topically as needed for itching. (Patient not taking: Reported on 12/13/2019)  . pravastatin (PRAVACHOL) 20 MG tablet Take 20 mg by mouth 2 (two) times a week.  . triamcinolone ointment (KENALOG) 0.5 % Apply 1 application topically 2 (two) times daily. (Patient not taking: Reported on 12/13/2019)  . valACYclovir (VALTREX) 1000 MG tablet as needed.   No facility-administered encounter medications on file as of 12/13/2019.    Activities of Daily Living In your present state of health, do you have any difficulty performing the following activities: 12/13/2019  Hearing? N  Vision? N  Difficulty concentrating  or making decisions? N  Walking or climbing stairs? N  Dressing or bathing? N  Doing errands, shopping? N  Preparing Food and eating ? N  Using the Toilet? N  In the past six months, have you accidently leaked  urine? Y  Do you have problems with loss of bowel control? N  Managing your Medications? N  Managing your Finances? N  Housekeeping or managing your Housekeeping? N  Some recent data might be hidden    Patient Care Team: Caren Macadam, MD as PCP - General (Family Medicine) Dorothy Spark, MD as PCP - Cardiology (Cardiology) Dorothy Spark, MD as Consulting Physician (Cardiology) Ladene Artist, MD as Consulting Physician (Gastroenterology) Festus Aloe, MD as Consulting Physician (Urology) [name], Darrick Grinder, MD as Consulting Physician (Allergy and Immunology) Monna Fam, MD as Consulting Physician (Ophthalmology) [name], MD (Dermatology) [name], MD as Consulting Physician (Orthopedic Surgery)    Assessment:   This is a routine wellness examination for Rebelle.  Exercise Activities and Dietary recommendations Current Exercise Habits: Home exercise routine, Type of exercise: treadmill, Time (Minutes): 45, Frequency (Times/Week): 3, Weekly Exercise (Minutes/Week): 135, Intensity: Moderate, Exercise limited by: None identified  Goals    . Patient Stated     Increase exercise routine, and eat healthier by eating more vegetables, especially once GI symptoms improve.        Fall Risk Fall Risk  12/13/2019 12/09/2018 10/22/2017 10/22/2017 07/05/2016  Falls in the past year? 0 0 No No Yes  Number falls in past yr: - - - - 1  Injury with Fall? - - - - -  Risk for fall due to : Medication side effect - - - -  Follow up Falls evaluation completed;Education provided;Falls prevention discussed - - - Education provided   Is the patient's home free of loose throw rugs in walkways, pet beds, electrical cords, etc?   yes      Grab bars in the bathroom? yes      Handrails on the stairs?   yes      Adequate lighting?   yes  Timed Get Up and Go performed: N/A  Depression Screen PHQ 2/9 Scores 12/13/2019 12/09/2018 10/22/2017 07/03/2016  PHQ - 2 Score 0 0 0 0   PHQ- 9 Score - 0 - -     Cognitive Function MMSE - Mini Mental State Exam 10/22/2017 07/05/2016  Not completed: (No Data) (No Data)     6CIT Screen 12/13/2019  What Year? 0 points  What month? 0 points  What time? 0 points  Count back from 20 0 points  Months in reverse 0 points  Repeat phrase 0 points  Total Score 0    Immunization History  Administered Date(s) Administered  . Fluad Quad(high Dose 65+) 07/02/2019  . Influenza Split 09/18/2011, 08/11/2012, 07/28/2014  . Influenza Whole 07/21/2008, 08/04/2009, 08/01/2010  . Influenza, High Dose Seasonal PF 07/19/2013, 07/28/2015, 09/01/2017, 08/04/2018  . Influenza,inj,Quad PF,6+ Mos 07/03/2016  . PFIZER SARS-COV-2 Vaccination 11/20/2019, 12/08/2019  . Pneumococcal Conjugate-13 12/06/2013  . Pneumococcal Polysaccharide-23 10/03/2008, 07/12/2019  . Tdap 11/04/2008, 10/24/2015  . Zoster 11/05/2003  . Zoster Recombinat (Shingrix) 07/30/2019, 09/03/2019    Qualifies for Shingles Vaccine?patient has completed #1 of 2.  Screening Tests Health Maintenance  Topic Date Due  . MAMMOGRAM  01/18/2020  . COLONOSCOPY  02/07/2020  . TETANUS/TDAP  10/23/2025  . INFLUENZA VACCINE  Completed  . DEXA SCAN  Completed  . PNA vac Low Risk Adult  Completed    Cancer Screenings: Lung: Low Dose CT Chest recommended if Age 37-80 years, 30 pack-year currently smoking OR have quit w/in 15years. Patient does not qualify. Breast:  Up to date on Mammogram? Yes   Up to date of Bone Density/Dexa? No; order sent to [facility]. Colorectal: yes; no longer necessary due to age.   Additional Screenings:  Hepatitis C Screening: N/A due to age.      Plan:   Mrs. Reffner will increase physical exercise after her scheduled cholecystectomy.  Hopefully she will feel better and be able to eat a more balanced diet and regular meals daily. Order for DEXA was sent to [facility] today.   I have personally reviewed and noted the following in the  patient's chart:   . Medical and social history . Use of alcohol, tobacco or illicit drugs  . Current medications and supplements . Functional ability and status . Nutritional status . Physical activity . Advanced directives . List of other physicians . Hospitalizations, surgeries, and ER visits in previous 12 months . Vitals . Screenings to include cognitive, depression, and falls . Referrals and appointments  In addition, I have reviewed and discussed with patient certain preventive protocols, quality metrics, and best practice recommendations. A written personalized care plan for preventive services as well as general preventive health recommendations were provided to patient.     Franne Forts, LPN  [id number]

## 2019-12-13 NOTE — Patient Instructions (Addendum)
Brianna Mccarty , Thank you for taking time to participate in your Medicare Wellness Visit. I appreciate your ongoing commitment to your health goals. Please review the following plan we discussed and let me know if I can assist you in the future.   Screening recommendations/referrals: Colorectal Screening: colonoscopy completed 02/07/2015; no longer necessary. Mammogram: completed 01/18/2019. Due again 01/18/2020.  Bone Density: completed 01/02/2017; order sent to [facility] today.  Vision and Dental Exams: Recommended annual ophthalmology exams for early detection of glaucoma and other disorders of the eye Recommended annual dental exams for proper oral hygiene  Diabetic Exams: Diabetic Eye Exam: N/A Diabetic Foot Exam: N/A  Vaccinations: Influenza vaccine: completed 07/02/2019. Due again in Fall 2021. Pneumococcal vaccine: completed 12/06/2013 & 10/03/2008.  Tdap vaccine: completed 10/24/2015; due again 10/23/2025.  Shingles vaccine: completed shingrix #1 on 09/03/2019. Will complete shingrix #2 after she completes covid vaccine.  Advanced directives: Advance directives discussed with you today.We have received a copy of your POA (Power of [place]) and/or Living Will. These documents can be located in your chart.  Goals: Continue to drink at least 6-8 8oz glasses of water per day.  Recommend to exercise for at least 150 minutes per week.  Recommend to remove any items from the home that may cause slips or trips.  Recommend to decrease portion sizes by eating 3 small healthy meals and at least 2 healthy snacks per day.  Next appointment: Please schedule your Annual Wellness Visit with your Nurse Health Advisor in one year.  Preventive Care 74 Years and Older, Female Preventive care refers to lifestyle choices and visits with your health care provider that can promote health and wellness. What does preventive care include?  A yearly physical exam. This is also called an  annual well check.  Dental exams once or twice a year.  Routine eye exams. Ask your health care provider how often you should have your eyes checked.  Personal lifestyle choices, including:  Daily care of your teeth and gums.  Regular physical activity.  Eating a healthy diet.  Avoiding tobacco and drug use.  Limiting alcohol use.  Practicing safe sex.  Taking low-dose aspirin every day if recommended by your health care provider.  Taking vitamin and mineral supplements as recommended by your health care provider. What happens during an annual well check? The services and screenings done by your health care provider during your annual well check will depend on your age, overall health, lifestyle risk factors, and family history of disease. Counseling  Your health care provider may ask you questions about your:  Alcohol use.  Tobacco use.  Drug use.  Emotional well-being.  Home and relationship well-being.  Sexual activity.  Eating habits.  History of falls.  Memory and ability to understand (cognition).  Work and work Statistician.  Reproductive health. Screening  You may have the following tests or measurements:  Height, weight, and BMI.  Blood pressure.  Lipid and cholesterol levels. These may be checked every 5 years, or more frequently if you are over 1 years old.  Skin check.  Lung cancer screening. You may have this screening every year starting at age 75 if you have a 30-pack-year history of smoking and currently smoke or have quit within the past 15 years.  Fecal occult blood test (FOBT) of the stool. You may have this test every year starting at age 63.  Flexible sigmoidoscopy or colonoscopy. You may have a sigmoidoscopy every 5 years or a colonoscopy every 10 years  starting at age 80.  Hepatitis C blood test.  Hepatitis B blood test.  Sexually transmitted disease (STD) testing.  Diabetes screening. This is done by checking your blood  sugar (glucose) after you have not eaten for a while (fasting). You may have this done every 1-3 years.  Bone density scan. This is done to screen for osteoporosis. You may have this done starting at age 50.  Mammogram. This may be done every 1-2 years. Talk to your health care provider about how often you should have regular mammograms. Talk with your health care provider about your test results, treatment options, and if necessary, the need for more tests. Vaccines  Your health care provider may recommend certain vaccines, such as:  Influenza vaccine. This is recommended every year.  Tetanus, diphtheria, and acellular pertussis (Tdap, Td) vaccine. You may need a Td booster every 10 years.  Zoster vaccine. You may need this after age 26.  Pneumococcal 13-valent conjugate (PCV13) vaccine. One dose is recommended after age 66.  Pneumococcal polysaccharide (PPSV23) vaccine. One dose is recommended after age 76. Talk to your health care provider about which screenings and vaccines you need and how often you need them. This information is not intended to replace advice given to you by your health care provider. Make sure you discuss any questions you have with your health care provider. Document Released: 11/17/2015 Document Revised: 07/10/2016 Document Reviewed: 08/22/2015 Elsevier Interactive Patient Education  2017 [place] Prevention in the Home Falls can cause injuries. They can happen to people of all ages. There are many things you can do to make your home safe and to help prevent falls. What can I do on the outside of my home?  Regularly fix the edges of walkways and driveways and fix any cracks.  Remove anything that might make you trip as you walk through a door, such as a raised step or threshold.  Trim any bushes or trees on the path to your home.  Use bright outdoor lighting.  Clear any walking paths of anything that might make someone trip, such as rocks or  tools.  Regularly check to see if handrails are loose or broken. Make sure that both sides of any steps have handrails.  Any raised decks and porches should have guardrails on the edges.  Have any leaves, snow, or ice cleared regularly.  Use sand or salt on walking paths during winter.  Clean up any spills in your garage right away. This includes oil or grease spills. What can I do in the bathroom?  Use night lights.  Install grab bars by the toilet and in the tub and shower. Do not use towel bars as grab bars.  Use non-skid mats or decals in the tub or shower.  If you need to sit down in the shower, use a plastic, non-slip stool.  Keep the floor dry. Clean up any water that spills on the floor as soon as it happens.  Remove soap buildup in the tub or shower regularly.  Attach bath mats securely with double-sided non-slip rug tape.  Do not have throw rugs and other things on the floor that can make you trip. What can I do in the bedroom?  Use night lights.  Make sure that you have a light by your bed that is easy to reach.  Do not use any sheets or blankets that are too big for your bed. They should not hang down onto the floor.  Have a  firm chair that has side arms. You can use this for support while you get dressed.  Do not have throw rugs and other things on the floor that can make you trip. What can I do in the kitchen?  Clean up any spills right away.  Avoid walking on wet floors.  Keep items that you use a lot in easy-to-reach places.  If you need to reach something above you, use a strong step stool that has a grab bar.  Keep electrical cords out of the way.  Do not use floor polish or wax that makes floors slippery. If you must use wax, use non-skid floor wax.  Do not have throw rugs and other things on the floor that can make you trip. What can I do with my stairs?  Do not leave any items on the stairs.  Make sure that there are handrails on both  sides of the stairs and use them. Fix handrails that are broken or loose. Make sure that handrails are as long as the stairways.  Check any carpeting to make sure that it is firmly attached to the stairs. Fix any carpet that is loose or worn.  Avoid having throw rugs at the top or bottom of the stairs. If you do have throw rugs, attach them to the floor with carpet tape.  Make sure that you have a light switch at the top of the stairs and the bottom of the stairs. If you do not have them, ask someone to add them for you. What else can I do to help prevent falls?  Wear shoes that:  Do not have high heels.  Have rubber bottoms.  Are comfortable and fit you well.  Are closed at the toe. Do not wear sandals.  If you use a stepladder:  Make sure that it is fully opened. Do not climb a closed stepladder.  Make sure that both sides of the stepladder are locked into place.  Ask someone to hold it for you, if possible.  Clearly mark and make sure that you can see:  Any grab bars or handrails.  First and last steps.  Where the edge of each step is.  Use tools that help you move around (mobility aids) if they are needed. These include:  Canes.  Walkers.  Scooters.  Crutches.  Turn on the lights when you go into a dark area. Replace any light bulbs as soon as they burn out.  Set up your furniture so you have a clear path. Avoid moving your furniture around.  If any of your floors are uneven, fix them.  If there are any pets around you, be aware of where they are.  Review your medicines with your doctor. Some medicines can make you feel dizzy. This can increase your chance of falling. Ask your doctor what other things that you can do to help prevent falls. This information is not intended to replace advice given to you by your health care provider. Make sure you discuss any questions you have with your health care provider. Document Released: 08/17/2009 Document Revised:  03/28/2016 Document Reviewed: 11/25/2014 Elsevier Interactive Patient Education  2017 [employer].

## 2019-12-15 ENCOUNTER — Other Ambulatory Visit: Payer: Self-pay

## 2019-12-15 ENCOUNTER — Encounter (HOSPITAL_BASED_OUTPATIENT_CLINIC_OR_DEPARTMENT_OTHER): Payer: Self-pay | Admitting: Surgery

## 2019-12-15 DIAGNOSIS — J3089 Other allergic rhinitis: Secondary | ICD-10-CM | POA: Diagnosis not present

## 2019-12-15 DIAGNOSIS — J301 Allergic rhinitis due to pollen: Secondary | ICD-10-CM | POA: Diagnosis not present

## 2019-12-15 NOTE — Progress Notes (Addendum)
Spoke w/ via phone for pre-op interview---Brianna Mccarty needs dos none             Mccarty results------Mccarty appt 12-20-2019 for cbc with dif, cmet, pt, ptt COVID test ------12-20-2019 Arrive at -------630 am 12-22-2019 NPO after ------midnight Medications to take morning of surgery -----albuterol inhaler pnr/bring inhaler, albuterol nebulizer, symbicort, hydrazaline, prazosin, amlodipine, elmiron, omeprazole, allegra Diabetic medication -----n/a Patient Special Instructions ----- Pre-Op special Istructions ----- Patient verbalized understanding of instructions that were given at this phone interview. Patient denies shortness of breath, chest pain, fever, cough a this phone interview.  Anesthesia cad, mild aortic stenosis Addendum: ok to proceed per Brianna Billow [place] pa  PCP: Brianna Mccarty Cardiologist :Brianna Mccarty, cardiac clearance Brianna Mccarty [facility] 11-19-2019 epic, last telemedicine visit cardiology Brianna Mccarty 05-27-2019 epic Chest ct: 08-17-2019 epic EKG :11-19-2019 epic Echo :06-23-2018 epic Cardiac Cath : none in epic Sleep Study/ CPAP :2019 home study not sure where, uses dental device for osa Fasting Blood Sugar :      / Checks Blood Sugar -- times a day:  n/a Blood Thinner/ Instructions /Last Dose:n/a ASA / Instructions/ Last Dose : 81 mg aspirin, patient to call Brianna Mccarty for instructions on 81 mg aspirin for 12-22-2019 surgery  Patient denies shortness of breath, chest pain, fever, and cough at this phone interview.

## 2019-12-16 NOTE — Progress Notes (Signed)
Anesthesia Chart Review   Case: [id number] Date/Time: 12/22/19 0815   Procedure: LAPAROSCOPIC CHOLECYSTECTOMY (N/A )   Anesthesia type: General   Pre-op diagnosis: GALLBLADDER POLYPS, BILIARY DYSKINESIA   Location: [facility] OR ROOM 3 / [facility]   Surgeons: Ileana Roup, MD      DISCUSSION:79 y.o. never smoker with h/o asthma, HTN, carotid artery disease, GERD, MVP, OSA, mild aortic stenosis (on Echo 06/23/2018 Mean gradient (S): 14 mm Hg. Peak gradient (S): 23 mm Hg.), gallbladder polyps, biliary dyskinesia scheduled for above procedure 12/22/2019 with Dr. Nadeen Landau.   Pt cleared by cardiology 11/19/2019.  Per Roby Lofts, PA-C, "EKG reviewed today and reveals NSR without ischemic changes. Patient is cleared to proceed with planned cholecystectomy. Per phone call with Sande Rives, PA-C below and based on ACC/AHA guidelines, PALYN WEDEKIND would be at acceptable risk for the planned procedure without further cardiovascular testing."  Anticipate pt can proceed with planned procedure barring acute status change.   VS: There were no vitals taken for this visit.  PROVIDERS: Caren Macadam, MD is PCP   Ena Dawley, MD is Cardiologist  LABS: labs DOS (all labs ordered are listed, but only abnormal results are displayed)  Labs Reviewed - No data to display   IMAGES:   EKG: 11/19/2019 Rate 59 bpm    CV: Echo 06/23/2018 Study Conclusions   - Left ventricle: The cavity size was normal. Wall thickness was  normal. Systolic function was normal. The estimated ejection  fraction was in the range of 55% to 60%. Wall motion was normal;  there were no regional wall motion abnormalities. Doppler  parameters are consistent with abnormal left ventricular  relaxation (grade 1 diastolic dysfunction). Doppler parameters  are consistent with elevated mean left atrial filling pressure.  - Aortic valve: There was mild stenosis.  - Mitral  valve: Severely calcified annulus. Mildly thickened  leaflets .  - Left atrium: The atrium was severely dilated.  - Atrial septum: No defect or patent foramen ovale was identified.   Myocardial Perfusion 01/27/2017  Nuclear stress EF: 71%.  There was no ST segment deviation noted during stress.  The study is normal.  This is a low risk study.  The left ventricular ejection fraction is hyperdynamic (>65%). Past Medical History:  Diagnosis Date  . Abnormal EKG    Normal LV function in the past  . Allergic rhinitis   . Asthma   . Bronchitis, mucopurulent recurrent ([place])   . Carotid artery disease ([facility])    a. mild by carotid duplex.  . Cervical dysplasia 1971  . Diverticulosis of colon   . DJD (degenerative joint disease)   . GERD (gastroesophageal reflux disease)   . Headache(784.0)   . Hypercholesterolemia   . Hypertension   . Interstitial cystitis    sees urologist  . Lichen sclerosus   . Malignant melanoma ([place]) 2006   sees Dr. [place] Crane in dermatology  . Migraines   . Mild aortic stenosis   . Mitral valve disease    Question mitral valve prolapse in the past, no prolapse by echo 2009  . Murmur 10/20/2015   SEES DR Meda Coffee  . OSA (obstructive sleep apnea)    USES DENTAL DEVICE  . Osteoporosis    on fosomax > 5 years, stopped 11/2015  . Renal calculus    sees urologist  . Thyroid cyst    1 x 1.1 thyroid cyst noted on carotid Doppler, January, 2012  . Venous insufficiency  Past Surgical History:  Procedure Laterality Date  . BREAST BIOPSY Left 11/25/2011   U/S core, benign performed at [facility], LEFT BREAT MARKER IN  . BREAST CYST ASPIRATION    . BREAST SURGERY  2013   Breast Bx-Benign  . CARDIAC CATHETERIZATION    . CATARACT EXTRACTION, BILATERAL    . cystoscopy and basket stone removal right ureter  02/2006   Dr. Terance Hart  . GYNECOLOGIC CRYOSURGERY  1971  . left total hip replacement  2004   Dr. Gladstone Lighter  . melanoma removed from medial rleft knee area  2006    Dr. Nevada Crane  . NASAL SEPTUM SURGERY      MEDICATIONS: . albuterol (PROAIR HFA) 108 (90 Base) MCG/ACT inhaler  . albuterol (PROVENTIL) (2.5 MG/3ML) 0.083% nebulizer solution  . amLODipine (NORVASC) 10 MG tablet  . Ascorbic Acid (VITAMIN C) 500 MG tablet  . aspirin 81 MG tablet  . aspirin-acetaminophen-caffeine (EXCEDRIN MIGRAINE) 250-250-65 MG tablet  . azelastine (ASTELIN) 0.1 % nasal spray  . Bempedoic Acid (NEXLETOL) 180 MG TABS  . budesonide-formoterol (SYMBICORT) 160-4.5 MCG/ACT inhaler  . Calcium Carbonate (CALCIUM 600) 1500 MG TABS  . Cholecalciferol (VITAMIN D3) 5000 UNITS CAPS  . Cinnamon 500 MG TABS  . clindamycin (CLEOCIN) 300 MG capsule  . Coenzyme Q10 (CO Q-10) 200 MG CAPS  . desmopressin (DDAVP) 0.2 MG tablet  . EPINEPHrine 0.3 mg/0.3 mL IJ SOAJ injection  . fexofenadine (ALLEGRA) 180 MG tablet  . furosemide (LASIX) 20 MG tablet  . hydrALAZINE (APRESOLINE) 50 MG tablet  . hydrOXYzine (ATARAX/VISTARIL) 10 MG tablet  . losartan (COZAAR) 100 MG tablet  . magnesium oxide (MAG-OX) 400 MG tablet  . miconazole (MICOTIN) 2 % powder  . Multiple Vitamins-Minerals (ICAPS AREDS 2 PO)  . omeprazole (PRILOSEC) 40 MG capsule  . pentosan polysulfate (ELMIRON) 100 MG capsule  . prazosin (MINIPRESS) 5 MG capsule  . Probiotic Product (PROBIOTIC PO)  . triamcinolone ointment (KENALOG) 0.5 %  . UNABLE TO FIND  . UNABLE TO FIND  . UNABLE TO FIND  . valACYclovir (VALTREX) 1000 MG tablet   No current facility-administered medications for this encounter.   Maia Plan [facility] Pre-Surgical Testing [phone] 12/16/19  3:25 PM

## 2019-12-18 ENCOUNTER — Other Ambulatory Visit (HOSPITAL_COMMUNITY): Payer: PPO

## 2019-12-20 ENCOUNTER — Other Ambulatory Visit: Payer: Self-pay

## 2019-12-20 ENCOUNTER — Other Ambulatory Visit (HOSPITAL_COMMUNITY)
Admission: RE | Admit: 2019-12-20 | Discharge: 2019-12-20 | Disposition: A | Discharge status: Home / Self Care | Payer: PPO | Source: Ambulatory Visit | Attending: Surgery | Admitting: Surgery

## 2019-12-20 ENCOUNTER — Encounter (HOSPITAL_COMMUNITY)
Admission: RE | Admit: 2019-12-20 | Discharge: 2019-12-20 | Disposition: A | Discharge status: Home / Self Care | Payer: PPO | Source: Ambulatory Visit | Attending: Surgery | Admitting: Surgery

## 2019-12-20 DIAGNOSIS — I2510 Atherosclerotic heart disease of native coronary artery without angina pectoris: Secondary | ICD-10-CM | POA: Diagnosis not present

## 2019-12-20 DIAGNOSIS — Z9841 Cataract extraction status, right eye: Secondary | ICD-10-CM | POA: Diagnosis not present

## 2019-12-20 DIAGNOSIS — M810 Age-related osteoporosis without current pathological fracture: Secondary | ICD-10-CM | POA: Diagnosis not present

## 2019-12-20 DIAGNOSIS — Z8249 Family history of ischemic heart disease and other diseases of the circulatory system: Secondary | ICD-10-CM | POA: Diagnosis not present

## 2019-12-20 DIAGNOSIS — M1990 Unspecified osteoarthritis, unspecified site: Secondary | ICD-10-CM | POA: Diagnosis not present

## 2019-12-20 DIAGNOSIS — K760 Fatty (change of) liver, not elsewhere classified: Secondary | ICD-10-CM | POA: Diagnosis not present

## 2019-12-20 DIAGNOSIS — G4733 Obstructive sleep apnea (adult) (pediatric): Secondary | ICD-10-CM | POA: Diagnosis not present

## 2019-12-20 DIAGNOSIS — R011 Cardiac murmur, unspecified: Secondary | ICD-10-CM | POA: Diagnosis not present

## 2019-12-20 DIAGNOSIS — Z01812 Encounter for preprocedural laboratory examination: Secondary | ICD-10-CM | POA: Insufficient documentation

## 2019-12-20 DIAGNOSIS — Z85820 Personal history of malignant melanoma of skin: Secondary | ICD-10-CM | POA: Diagnosis not present

## 2019-12-20 DIAGNOSIS — Z20822 Contact with and (suspected) exposure to covid-19: Secondary | ICD-10-CM | POA: Insufficient documentation

## 2019-12-20 DIAGNOSIS — K824 Cholesterolosis of gallbladder: Secondary | ICD-10-CM | POA: Diagnosis not present

## 2019-12-20 DIAGNOSIS — D414 Neoplasm of uncertain behavior of bladder: Secondary | ICD-10-CM | POA: Diagnosis not present

## 2019-12-20 DIAGNOSIS — J45909 Unspecified asthma, uncomplicated: Secondary | ICD-10-CM | POA: Diagnosis not present

## 2019-12-20 DIAGNOSIS — E7800 Pure hypercholesterolemia, unspecified: Secondary | ICD-10-CM | POA: Diagnosis not present

## 2019-12-20 DIAGNOSIS — K828 Other specified diseases of gallbladder: Secondary | ICD-10-CM | POA: Diagnosis not present

## 2019-12-20 DIAGNOSIS — K219 Gastro-esophageal reflux disease without esophagitis: Secondary | ICD-10-CM | POA: Diagnosis not present

## 2019-12-20 DIAGNOSIS — Z87442 Personal history of urinary calculi: Secondary | ICD-10-CM | POA: Diagnosis not present

## 2019-12-20 DIAGNOSIS — I10 Essential (primary) hypertension: Secondary | ICD-10-CM | POA: Diagnosis not present

## 2019-12-20 DIAGNOSIS — E785 Hyperlipidemia, unspecified: Secondary | ICD-10-CM | POA: Diagnosis not present

## 2019-12-20 DIAGNOSIS — I872 Venous insufficiency (chronic) (peripheral): Secondary | ICD-10-CM | POA: Diagnosis not present

## 2019-12-20 DIAGNOSIS — K5730 Diverticulosis of large intestine without perforation or abscess without bleeding: Secondary | ICD-10-CM | POA: Diagnosis not present

## 2019-12-20 DIAGNOSIS — Z9842 Cataract extraction status, left eye: Secondary | ICD-10-CM | POA: Diagnosis not present

## 2019-12-20 DIAGNOSIS — K862 Cyst of pancreas: Secondary | ICD-10-CM | POA: Diagnosis not present

## 2019-12-20 DIAGNOSIS — I059 Rheumatic mitral valve disease, unspecified: Secondary | ICD-10-CM | POA: Diagnosis not present

## 2019-12-20 DIAGNOSIS — N3010 Interstitial cystitis (chronic) without hematuria: Secondary | ICD-10-CM | POA: Diagnosis not present

## 2019-12-20 DIAGNOSIS — Z96642 Presence of left artificial hip joint: Secondary | ICD-10-CM | POA: Diagnosis not present

## 2019-12-20 LAB — CBC WITH DIFFERENTIAL/PLATELET
Abs Immature Granulocytes: 0.02 10*3/uL (ref 0.00–0.07)
Basophils Absolute: 0 10*3/uL (ref 0.0–0.1)
Basophils Relative: 1 %
Eosinophils Absolute: 0.1 10*3/uL (ref 0.0–0.5)
Eosinophils Relative: 2 %
HCT: 37.2 % (ref 36.0–46.0)
Hemoglobin: 12.2 g/dL (ref 12.0–15.0)
Immature Granulocytes: 1 %
Lymphocytes Relative: 27 %
Lymphs Abs: 1.2 10*3/uL (ref 0.7–4.0)
MCH: 29.8 pg (ref 26.0–34.0)
MCHC: 32.8 g/dL (ref 30.0–36.0)
MCV: 91 fL (ref 80.0–100.0)
Monocytes Absolute: 0.5 10*3/uL (ref 0.1–1.0)
Monocytes Relative: 12 %
Neutro Abs: 2.5 10*3/uL (ref 1.7–7.7)
Neutrophils Relative %: 57 %
Platelets: 252 10*3/uL (ref 150–400)
RBC: 4.09 MIL/uL (ref 3.87–5.11)
RDW: 12.6 % (ref 11.5–15.5)
WBC: 4.3 10*3/uL (ref 4.0–10.5)
nRBC: 0 % (ref 0.0–0.2)

## 2019-12-20 LAB — COMPREHENSIVE METABOLIC PANEL WITH GFR
ALT: 16 U/L (ref 0–44)
AST: 17 U/L (ref 15–41)
Albumin: 4.2 g/dL (ref 3.5–5.0)
Alkaline Phosphatase: 35 U/L — ABNORMAL LOW (ref 38–126)
Anion gap: 9 (ref 5–15)
BUN: 19 mg/dL (ref 8–23)
CO2: 26 mmol/L (ref 22–32)
Calcium: 9.7 mg/dL (ref 8.9–10.3)
Chloride: 103 mmol/L (ref 98–111)
Creatinine, Ser: 0.71 mg/dL (ref 0.44–1.00)
GFR calc Af Amer: >60 mL/min
GFR calc non Af Amer: >60 mL/min
Glucose, Bld: 106 mg/dL — ABNORMAL HIGH (ref 70–99)
Potassium: 3.9 mmol/L (ref 3.5–5.1)
Sodium: 138 mmol/L (ref 135–145)
Total Bilirubin: 0.3 mg/dL (ref 0.3–1.2)
Total Protein: 6.7 g/dL (ref 6.5–8.1)

## 2019-12-20 LAB — APTT: aPTT: 32 seconds (ref 24–36)

## 2019-12-20 LAB — PROTIME-INR
INR: 1.1 (ref 0.8–1.2)
Prothrombin Time: 13.6 seconds (ref 11.4–15.2)

## 2019-12-20 LAB — SARS CORONAVIRUS 2 (TAT 6-24 HRS): SARS Coronavirus 2: NEGATIVE

## 2019-12-21 NOTE — Anesthesia Preprocedure Evaluation (Addendum)
Anesthesia Evaluation  Patient identified by MRN, date of birth, ID band Patient awake    Reviewed: Allergy & Precautions, NPO status , Patient's Chart, lab work & pertinent test results  Airway Mallampati: II  TM Distance: >3 FB Neck ROM: Full    Dental no notable dental hx. (+) Dental Advisory Given, Teeth Intact   Pulmonary asthma , sleep apnea ,  OSA- uses dental device   Pulmonary exam normal breath sounds clear to auscultation       Cardiovascular hypertension, Pt. on medications Normal cardiovascular exam+ Valvular Problems/Murmurs AS  Rhythm:Regular Rate:Normal  HLD  Last echo 06/2018:  - Left ventricle: The cavity size was normal. Wall thickness was  normal. Systolic function was normal. The estimated ejection  fraction was in the range of 55% to 60%. Wall motion was normal;  there were no regional wall motion abnormalities. Doppler  parameters are consistent with abnormal left ventricular  relaxation (grade 1 diastolic dysfunction). Doppler parameters  are consistent with elevated mean left atrial filling pressure.  - Aortic valve: There was mild stenosis.  - Mitral valve: Severely calcified annulus. Mildly thickened  leaflets .  - Left atrium: The atrium was severely dilated.  - Atrial septum: No defect or patent foramen ovale was identified.    Neuro/Psych  Headaches, Carotid doppler 06/2019: Dr. Meda Coffee, please review, previous doppler in 2018 showed 1-39% disease bilaterally. 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  negative psych ROS   GI/Hepatic Neg liver ROS, GERD  Medicated and Controlled,Gall bladder polyps, biliary dyskinesia   Endo/Other  negative endocrine ROS  Renal/GU negative Renal ROS   Interstitial cystitis    Musculoskeletal  (+) Arthritis , Osteoarthritis,    Abdominal (+) + obese,   Peds negative pediatric ROS (+)   Hematology negative hematology ROS (+)   Anesthesia Other Findings   Reproductive/Obstetrics negative OB ROS                            Anesthesia Physical Anesthesia Plan  ASA: III  Anesthesia Plan: General   Post-op Pain Management:    Induction: Intravenous  PONV Risk Score and Plan: 4 or greater and Ondansetron, Dexamethasone and Treatment may vary due to age or medical condition  Airway Management Planned: Oral ETT  Additional Equipment: None  Intra-op Plan:   Post-operative Plan: Extubation in OR  Informed Consent: I have reviewed the patients History and Physical, chart, labs and discussed the procedure including the risks, benefits and alternatives for the proposed anesthesia with the patient or authorized representative who has indicated his/her understanding and acceptance.     Dental advisory given  Plan Discussed with: CRNA  Anesthesia Plan Comments:         Anesthesia Quick Evaluation

## 2019-12-22 ENCOUNTER — Encounter (HOSPITAL_BASED_OUTPATIENT_CLINIC_OR_DEPARTMENT_OTHER)
Admission: RE | Disposition: A | Discharge status: Home / Self Care | Payer: Self-pay | Source: Home / Self Care | Attending: Surgery

## 2019-12-22 ENCOUNTER — Encounter (HOSPITAL_BASED_OUTPATIENT_CLINIC_OR_DEPARTMENT_OTHER): Payer: Self-pay | Admitting: Surgery

## 2019-12-22 ENCOUNTER — Ambulatory Visit (HOSPITAL_BASED_OUTPATIENT_CLINIC_OR_DEPARTMENT_OTHER): Payer: PPO | Admitting: Physician Assistant

## 2019-12-22 ENCOUNTER — Ambulatory Visit (HOSPITAL_BASED_OUTPATIENT_CLINIC_OR_DEPARTMENT_OTHER)
Admission: RE | Admit: 2019-12-22 | Discharge: 2019-12-22 | Disposition: A | Discharge status: Home / Self Care | Payer: PPO | Attending: Surgery | Admitting: Surgery

## 2019-12-22 ENCOUNTER — Ambulatory Visit (HOSPITAL_BASED_OUTPATIENT_CLINIC_OR_DEPARTMENT_OTHER): Payer: PPO | Admitting: Anesthesiology

## 2019-12-22 DIAGNOSIS — J45909 Unspecified asthma, uncomplicated: Secondary | ICD-10-CM | POA: Insufficient documentation

## 2019-12-22 DIAGNOSIS — K760 Fatty (change of) liver, not elsewhere classified: Secondary | ICD-10-CM | POA: Insufficient documentation

## 2019-12-22 DIAGNOSIS — I2510 Atherosclerotic heart disease of native coronary artery without angina pectoris: Secondary | ICD-10-CM | POA: Diagnosis not present

## 2019-12-22 DIAGNOSIS — Z8249 Family history of ischemic heart disease and other diseases of the circulatory system: Secondary | ICD-10-CM | POA: Insufficient documentation

## 2019-12-22 DIAGNOSIS — I059 Rheumatic mitral valve disease, unspecified: Secondary | ICD-10-CM | POA: Insufficient documentation

## 2019-12-22 DIAGNOSIS — Z888 Allergy status to other drugs, medicaments and biological substances status: Secondary | ICD-10-CM | POA: Insufficient documentation

## 2019-12-22 DIAGNOSIS — I10 Essential (primary) hypertension: Secondary | ICD-10-CM | POA: Insufficient documentation

## 2019-12-22 DIAGNOSIS — Z9842 Cataract extraction status, left eye: Secondary | ICD-10-CM | POA: Insufficient documentation

## 2019-12-22 DIAGNOSIS — E785 Hyperlipidemia, unspecified: Secondary | ICD-10-CM | POA: Insufficient documentation

## 2019-12-22 DIAGNOSIS — Z87442 Personal history of urinary calculi: Secondary | ICD-10-CM | POA: Insufficient documentation

## 2019-12-22 DIAGNOSIS — Z96642 Presence of left artificial hip joint: Secondary | ICD-10-CM | POA: Diagnosis not present

## 2019-12-22 DIAGNOSIS — Z880 Allergy status to penicillin: Secondary | ICD-10-CM | POA: Insufficient documentation

## 2019-12-22 DIAGNOSIS — K824 Cholesterolosis of gallbladder: Secondary | ICD-10-CM | POA: Insufficient documentation

## 2019-12-22 DIAGNOSIS — K219 Gastro-esophageal reflux disease without esophagitis: Secondary | ICD-10-CM | POA: Diagnosis not present

## 2019-12-22 DIAGNOSIS — E7800 Pure hypercholesterolemia, unspecified: Secondary | ICD-10-CM | POA: Diagnosis not present

## 2019-12-22 DIAGNOSIS — Z833 Family history of diabetes mellitus: Secondary | ICD-10-CM | POA: Insufficient documentation

## 2019-12-22 DIAGNOSIS — I872 Venous insufficiency (chronic) (peripheral): Secondary | ICD-10-CM | POA: Insufficient documentation

## 2019-12-22 DIAGNOSIS — K5730 Diverticulosis of large intestine without perforation or abscess without bleeding: Secondary | ICD-10-CM | POA: Insufficient documentation

## 2019-12-22 DIAGNOSIS — Z85820 Personal history of malignant melanoma of skin: Secondary | ICD-10-CM | POA: Insufficient documentation

## 2019-12-22 DIAGNOSIS — Z9841 Cataract extraction status, right eye: Secondary | ICD-10-CM | POA: Insufficient documentation

## 2019-12-22 DIAGNOSIS — M810 Age-related osteoporosis without current pathological fracture: Secondary | ICD-10-CM | POA: Insufficient documentation

## 2019-12-22 DIAGNOSIS — K828 Other specified diseases of gallbladder: Secondary | ICD-10-CM | POA: Diagnosis not present

## 2019-12-22 DIAGNOSIS — Z881 Allergy status to other antibiotic agents status: Secondary | ICD-10-CM | POA: Insufficient documentation

## 2019-12-22 DIAGNOSIS — N3010 Interstitial cystitis (chronic) without hematuria: Secondary | ICD-10-CM | POA: Insufficient documentation

## 2019-12-22 DIAGNOSIS — Z800 Family history of malignant neoplasm of digestive organs: Secondary | ICD-10-CM | POA: Insufficient documentation

## 2019-12-22 DIAGNOSIS — K862 Cyst of pancreas: Secondary | ICD-10-CM | POA: Insufficient documentation

## 2019-12-22 DIAGNOSIS — R011 Cardiac murmur, unspecified: Secondary | ICD-10-CM | POA: Insufficient documentation

## 2019-12-22 DIAGNOSIS — G4733 Obstructive sleep apnea (adult) (pediatric): Secondary | ICD-10-CM | POA: Insufficient documentation

## 2019-12-22 DIAGNOSIS — R519 Headache, unspecified: Secondary | ICD-10-CM | POA: Insufficient documentation

## 2019-12-22 DIAGNOSIS — M1990 Unspecified osteoarthritis, unspecified site: Secondary | ICD-10-CM | POA: Diagnosis not present

## 2019-12-22 DIAGNOSIS — Z91048 Other nonmedicinal substance allergy status: Secondary | ICD-10-CM | POA: Insufficient documentation

## 2019-12-22 HISTORY — PX: CHOLECYSTECTOMY: SHX55

## 2019-12-22 HISTORY — PX: GALLBLADDER SURGERY: SHX652

## 2019-12-22 SURGERY — LAPAROSCOPIC CHOLECYSTECTOMY
Anesthesia: General | Site: Abdomen

## 2019-12-22 MED ORDER — KETOROLAC TROMETHAMINE 15 MG/ML IJ SOLN
15.0000 mg | Freq: Once | INTRAMUSCULAR | Status: DC | PRN
  Filled 2019-12-22: qty 1

## 2019-12-22 MED ORDER — SUGAMMADEX SODIUM 200 MG/2ML IV SOLN
INTRAVENOUS | Status: DC | PRN
  Administered 2019-12-22: 200 mg via INTRAVENOUS

## 2019-12-22 MED ORDER — ROCURONIUM BROMIDE 100 MG/10ML IV SOLN
INTRAVENOUS | Status: DC | PRN
  Administered 2019-12-22: 50 mg via INTRAVENOUS

## 2019-12-22 MED ORDER — CLINDAMYCIN PHOSPHATE 900 MG/50ML IV SOLN
900.0000 mg | INTRAVENOUS | Status: DC
  Filled 2019-12-22: qty 50

## 2019-12-22 MED ORDER — GENTAMICIN SULFATE 40 MG/ML IJ SOLN
5.0000 mg/kg | INTRAVENOUS | Status: AC
  Administered 2019-12-22: 313.5 mg via INTRAVENOUS
  Filled 2019-12-22: qty 7.75

## 2019-12-22 MED ORDER — EPHEDRINE 5 MG/ML INJ
INTRAVENOUS | Status: AC
  Filled 2019-12-22: qty 10

## 2019-12-22 MED ORDER — LACTATED RINGERS IV SOLN
INTRAVENOUS | Status: DC
  Filled 2019-12-22: qty 1000

## 2019-12-22 MED ORDER — LIDOCAINE 2% (20 MG/ML) 5 ML SYRINGE
INTRAMUSCULAR | Status: AC
  Filled 2019-12-22: qty 5

## 2019-12-22 MED ORDER — FENTANYL CITRATE (PF) 100 MCG/2ML IJ SOLN
INTRAMUSCULAR | Status: AC
  Filled 2019-12-22: qty 2

## 2019-12-22 MED ORDER — FENTANYL CITRATE (PF) 100 MCG/2ML IJ SOLN
INTRAMUSCULAR | Status: DC | PRN
  Administered 2019-12-22 (×2): 25 ug via INTRAVENOUS
  Administered 2019-12-22: 50 ug via INTRAVENOUS

## 2019-12-22 MED ORDER — DEXAMETHASONE SODIUM PHOSPHATE 4 MG/ML IJ SOLN
INTRAMUSCULAR | Status: DC | PRN
  Administered 2019-12-22: 5 mg via INTRAVENOUS

## 2019-12-22 MED ORDER — LIDOCAINE HCL (CARDIAC) PF 100 MG/5ML IV SOSY
PREFILLED_SYRINGE | INTRAVENOUS | Status: DC | PRN
  Administered 2019-12-22: 60 mg via INTRAVENOUS

## 2019-12-22 MED ORDER — ONDANSETRON HCL 4 MG/2ML IJ SOLN
INTRAMUSCULAR | Status: AC
  Filled 2019-12-22: qty 2

## 2019-12-22 MED ORDER — ACETAMINOPHEN 500 MG PO TABS
1000.0000 mg | ORAL_TABLET | Freq: Once | ORAL | Status: AC
  Administered 2019-12-22: 1000 mg via ORAL
  Filled 2019-12-22: qty 2

## 2019-12-22 MED ORDER — ROCURONIUM BROMIDE 10 MG/ML (PF) SYRINGE
PREFILLED_SYRINGE | INTRAVENOUS | Status: AC
  Filled 2019-12-22: qty 10

## 2019-12-22 MED ORDER — CHLORHEXIDINE GLUCONATE CLOTH 2 % EX PADS
6.0000 | MEDICATED_PAD | Freq: Once | CUTANEOUS | Status: DC
  Filled 2019-12-22: qty 6

## 2019-12-22 MED ORDER — KETOROLAC TROMETHAMINE 30 MG/ML IJ SOLN
INTRAMUSCULAR | Status: DC | PRN
  Administered 2019-12-22: 15 mg via INTRAVENOUS

## 2019-12-22 MED ORDER — GLYCOPYRROLATE 0.2 MG/ML IJ SOLN
INTRAMUSCULAR | Status: DC | PRN
  Administered 2019-12-22: .2 mg via INTRAVENOUS

## 2019-12-22 MED ORDER — CLINDAMYCIN PHOSPHATE 900 MG/50ML IV SOLN
900.0000 mg | INTRAVENOUS | Status: AC
  Administered 2019-12-22: 900 mg via INTRAVENOUS
  Filled 2019-12-22: qty 50

## 2019-12-22 MED ORDER — ONDANSETRON HCL 4 MG/2ML IJ SOLN
INTRAMUSCULAR | Status: DC | PRN
  Administered 2019-12-22: 4 mg via INTRAVENOUS

## 2019-12-22 MED ORDER — ACETAMINOPHEN 500 MG PO TABS
ORAL_TABLET | ORAL | Status: AC
  Filled 2019-12-22: qty 2

## 2019-12-22 MED ORDER — EPHEDRINE SULFATE 50 MG/ML IJ SOLN
INTRAMUSCULAR | Status: DC | PRN
  Administered 2019-12-22 (×2): 20 mg via INTRAVENOUS
  Administered 2019-12-22 (×2): 10 mg via INTRAVENOUS
  Administered 2019-12-22: 20 mg via INTRAVENOUS

## 2019-12-22 MED ORDER — BUPIVACAINE-EPINEPHRINE 0.5% -1:200000 IJ SOLN
INTRAMUSCULAR | Status: DC | PRN
  Administered 2019-12-22: 18 mL

## 2019-12-22 MED ORDER — SODIUM CHLORIDE 0.9 % IR SOLN
Status: DC | PRN
  Administered 2019-12-22: 3000 mL

## 2019-12-22 MED ORDER — GLYCOPYRROLATE PF 0.2 MG/ML IJ SOSY
PREFILLED_SYRINGE | INTRAMUSCULAR | Status: AC
  Filled 2019-12-22: qty 1

## 2019-12-22 MED ORDER — ACETAMINOPHEN 500 MG PO TABS
1000.0000 mg | ORAL_TABLET | ORAL | Status: DC
  Filled 2019-12-22: qty 2

## 2019-12-22 MED ORDER — PROPOFOL 10 MG/ML IV BOLUS
INTRAVENOUS | Status: AC
  Filled 2019-12-22: qty 40

## 2019-12-22 MED ORDER — ONDANSETRON HCL 4 MG/2ML IJ SOLN
4.0000 mg | Freq: Once | INTRAMUSCULAR | Status: DC | PRN
  Filled 2019-12-22: qty 2

## 2019-12-22 MED ORDER — GENTAMICIN SULFATE 40 MG/ML IJ SOLN
5.0000 mg/kg | INTRAVENOUS | Status: DC
  Filled 2019-12-22: qty 10.5

## 2019-12-22 MED ORDER — TRAMADOL HCL 50 MG PO TABS: 50.0000 mg | ORAL_TABLET | Freq: Four times a day (QID) | ORAL | 0 refills | Status: AC | PRN

## 2019-12-22 MED ORDER — FENTANYL CITRATE (PF) 100 MCG/2ML IJ SOLN
25.0000 ug | INTRAMUSCULAR | Status: DC | PRN
  Filled 2019-12-22: qty 1

## 2019-12-22 MED ORDER — PROPOFOL 10 MG/ML IV BOLUS
INTRAVENOUS | Status: DC | PRN
  Administered 2019-12-22: 100 mg via INTRAVENOUS

## 2019-12-22 MED ORDER — DEXAMETHASONE SODIUM PHOSPHATE 10 MG/ML IJ SOLN
INTRAMUSCULAR | Status: AC
  Filled 2019-12-22: qty 1

## 2019-12-22 MED ORDER — CLINDAMYCIN PHOSPHATE 900 MG/50ML IV SOLN
INTRAVENOUS | Status: AC
  Filled 2019-12-22: qty 50

## 2019-12-22 SURGICAL SUPPLY — 54 items
ADH SKN CLS APL DERMABOND .7 (GAUZE/BANDAGES/DRESSINGS) ×1
APL PRP STRL LF DISP 70% ISPRP (MISCELLANEOUS) ×1
APL SRG 38 LTWT LNG FL B (MISCELLANEOUS)
APPLICATOR ARISTA FLEXITIP XL (MISCELLANEOUS) IMPLANT
APPLIER CLIP 5 13 M/L LIGAMAX5 (MISCELLANEOUS) ×2
APPLIER CLIP ROT 10 11.4 M/L (STAPLE)
APR CLP MED LRG 11.4X10 (STAPLE)
APR CLP MED LRG 5 ANG JAW (MISCELLANEOUS) ×1
BAG SPEC RTRVL LRG 6X4 10 (ENDOMECHANICALS) ×1
BLADE 15 SAFETY STRL DISP (BLADE) ×1 IMPLANT
CABLE HIGH FREQUENCY MONO STRZ (ELECTRODE) ×2 IMPLANT
CHLORAPREP W/TINT 26 (MISCELLANEOUS) ×2 IMPLANT
CLIP APPLIE 5 13 M/L LIGAMAX5 (MISCELLANEOUS) ×1 IMPLANT
CLIP APPLIE ROT 10 11.4 M/L (STAPLE) IMPLANT
COVER MAYO STAND STRL (DRAPES) ×1 IMPLANT
COVER SURGICAL LIGHT HANDLE (MISCELLANEOUS) ×2 IMPLANT
COVER WAND RF STERILE (DRAPES) ×2 IMPLANT
DECANTER SPIKE VIAL GLASS SM (MISCELLANEOUS) ×2 IMPLANT
DERMABOND ADVANCED (GAUZE/BANDAGES/DRESSINGS) ×1
DERMABOND ADVANCED .7 DNX12 (GAUZE/BANDAGES/DRESSINGS) ×1 IMPLANT
DISSECTOR BLUNT TIP ENDO 5MM (MISCELLANEOUS) IMPLANT
DRAPE C-ARM 42X120 X-RAY (DRAPES) IMPLANT
ELECT REM PT RETURN 15FT ADLT (MISCELLANEOUS) ×2 IMPLANT
GLOVE BIO SURGEON STRL SZ 6.5 (GLOVE) ×2 IMPLANT
GLOVE BIO SURGEON STRL SZ7 (GLOVE) ×1 IMPLANT
GLOVE BIO SURGEON STRL SZ7.5 (GLOVE) ×2 IMPLANT
GLOVE BIOGEL PI IND STRL 7.0 (GLOVE) ×1 IMPLANT
GLOVE BIOGEL PI IND STRL 7.5 (GLOVE) IMPLANT
GLOVE BIOGEL PI INDICATOR 7.0 (GLOVE) ×1
GLOVE BIOGEL PI INDICATOR 7.5 (GLOVE) ×1
GLOVE INDICATOR 8.0 STRL GRN (GLOVE) ×2 IMPLANT
GOWN STRL REUS W/TWL XL LVL3 (GOWN DISPOSABLE) ×2 IMPLANT
GRASPER SUT TROCAR 14GX15 (MISCELLANEOUS) IMPLANT
HEMOSTAT ARISTA ABSORB 3G PWDR (HEMOSTASIS) IMPLANT
HEMOSTAT SNOW SURGICEL 2X4 (HEMOSTASIS) IMPLANT
KIT TURNOVER CYSTO (KITS) IMPLANT
NEEDLE INSUFFLATION 14GA 120MM (NEEDLE) ×2 IMPLANT
PACK BASIN DAY SURGERY FS (CUSTOM PROCEDURE TRAY) ×2 IMPLANT
PENCIL SMOKE EVACUATOR (MISCELLANEOUS) IMPLANT
POUCH SPECIMEN RETRIEVAL 10MM (ENDOMECHANICALS) ×2 IMPLANT
SCISSORS LAP 5X35 DISP (ENDOMECHANICALS) ×2 IMPLANT
SET CHOLANGIOGRAPH MIX (MISCELLANEOUS) IMPLANT
SET IRRIG TUBING LAPAROSCOPIC (IRRIGATION / IRRIGATOR) ×2 IMPLANT
SET TUBE SMOKE EVAC HIGH FLOW (TUBING) ×2 IMPLANT
SLEEVE ADV FIXATION 5X100MM (TROCAR) ×6 IMPLANT
SUT MNCRL AB 4-0 PS2 18 (SUTURE) ×2 IMPLANT
SYR 10ML ECCENTRIC (SYRINGE) ×2 IMPLANT
TOWEL OR 17X26 10 PK STRL BLUE (TOWEL DISPOSABLE) ×2 IMPLANT
TRAY LAPAROSCOPIC (CUSTOM PROCEDURE TRAY) ×2 IMPLANT
TROCAR ADV FIXATION 12X100MM (TROCAR) IMPLANT
TROCAR ADV FIXATION 5X100MM (TROCAR) ×2 IMPLANT
TROCAR BLADELESS OPT 5 100 (ENDOMECHANICALS) ×2 IMPLANT
TROCAR XCEL BLUNT TIP 100MML (ENDOMECHANICALS) ×2 IMPLANT
TROCAR XCEL NON-BLD 11X100MML (ENDOMECHANICALS) IMPLANT

## 2019-12-22 NOTE — Anesthesia Procedure Notes (Signed)
Procedure Name: Intubation Date/Time: 12/22/2019 8:41 AM Performed by: Justice Rocher, CRNA Pre-anesthesia Checklist: Patient identified, Emergency Drugs available, Suction available and Patient being monitored Patient Re-evaluated:Patient Re-evaluated prior to induction Oxygen Delivery Method: Circle system utilized Preoxygenation: Pre-oxygenation with 100% oxygen Induction Type: IV induction Ventilation: Mask ventilation without difficulty Laryngoscope Size: Mac and 3 Grade View: Grade II Tube type: Oral Tube size: 7.5 mm Number of attempts: 1 Airway Equipment and Method: Stylet and Oral airway Placement Confirmation: ETT inserted through vocal cords under direct vision,  positive ETCO2,  breath sounds checked- equal and bilateral and CO2 detector Secured at: 23 cm Tube secured with: Tape Dental Injury: Teeth and Oropharynx as per pre-operative assessment  Comments: Lips chapped- ointment applied

## 2019-12-22 NOTE — Discharge Instructions (Addendum)
Post Anesthesia Home Care Instructions  Activity: Get plenty of rest for the remainder of the day. A responsible adult should stay with you for 24 hours following the procedure.  For the next 24 hours, DO NOT: -Drive a car -Paediatric nurse -Drink alcoholic beverages -Take any medication unless instructed by your physician -Make any legal decisions or sign important papers.  Meals: Start with liquid foods such as gelatin or soup. Progress to regular foods as tolerated. Avoid greasy, spicy, heavy foods. If nausea and/or vomiting occur, drink only clear liquids until the nausea and/or vomiting subsides. Call your physician if vomiting continues.  Special Instructions/Symptoms: Your throat may feel dry or sore from the anesthesia or the breathing tube placed in your throat during surgery. If this causes discomfort, gargle with warm salt water. The discomfort should disappear within 24 hours.  If you had a scopolamine patch placed behind your ear for the management of post- operative nausea and/or vomiting:  1. The medication in the patch is effective for 72 hours, after which it should be removed.  Wrap patch in a tissue and discard in the trash. Wash hands thoroughly with soap and water. 2. You may remove the patch earlier than 72 hours if you experience unpleasant side effects which may include dry mouth, dizziness or visual disturbances. 3. Avoid touching the patch. Wash your hands with soap and water after contact with the patch.   POST OP INSTRUCTIONS  1. DIET: As tolerated. Follow a light bland diet the first 24 hours after arrival home, such as soup, liquids, crackers, etc.  Be sure to include lots of fluids daily.  Avoid fast food or heavy meals as your are more likely to get nauseated.  Eat a low fat the next few days after surgery.  2. Take your usually prescribed home medications unless otherwise directed.  3. PAIN CONTROL: a. Pain is best controlled by a usual combination of  three different methods TOGETHER: i. Ice/Heat ii. Over the counter pain medication iii. Prescription pain medication b. Most patients will experience some swelling and bruising around the surgical site.  Ice packs or heating pads (30-60 minutes up to 6 times a day) will help. Some people prefer to use ice alone, heat alone, alternating between ice & heat.  Experiment to what works for you.  Swelling and bruising can take several weeks to resolve.   c. It is helpful to take an over-the-counter pain medication regularly for the first few weeks: i. Ibuprofen (Motrin/Advil) - 200mg  tabs - take 3 tabs (600mg ) every 6 hours as needed for pain ii. Acetaminophen (Tylenol) - you may take 650mg  every 6 hours as needed. You can take this with motrin as they act differently on the body. If you are taking a narcotic pain medication that has acetaminophen in it, do not take over the counter tylenol at the same time.  Iii. NOTE: You may take both of these medications together - most patients  find it most helpful when alternating between the two (i.e. Ibuprofen at 6am, tylenol at 9am, ibuprofen at 12pm ...) d. A  prescription for pain medication should be given to you upon discharge.  Take your pain medication as prescribed if your pain is not adequatly controlled with the over-the-counter pain reliefs mentioned above.  4. Avoid getting constipated.  Between the surgery and the pain medications, it is common to experience some constipation.  Increasing fluid intake and taking a fiber supplement (such as Metamucil, Citrucel, FiberCon, MiraLax, etc) 1-2  times a day regularly will usually help prevent this problem from occurring.  A mild laxative (prune juice, Milk of Magnesia, MiraLax, etc) should be taken according to package directions if there are no bowel movements after 48 hours.    5. Dressing: Your incision are covered in Dermabond which is like sterile superglue for the skin. This will come off on it's own in a  couple weeks. It is waterproof and you may bathe normally starting the day after your surgery in a shower. Avoid baths/pools/lakes/oceans until your wounds have fully healed.  6. ACTIVITIES as tolerated:   a. Avoid heavy lifting (>10lbs or 1 gallon of milk) for the next 6 weeks. b. You may resume regular (light) daily activities beginning the next day--such as daily self-care, walking, climbing stairs--gradually increasing activities as tolerated.  If you can walk 30 minutes without difficulty, it is safe to try more intense activity such as jogging, treadmill, bicycling, low-impact aerobics.  c. DO NOT PUSH THROUGH PAIN.  Let pain be your guide: If it hurts to do something, don't do it. d. Dennis Bast may drive when you are no longer taking prescription pain medication, you can comfortably wear a seatbelt, and you can safely maneuver your car and apply brakes. e. Dennis Bast may have sexual intercourse when it is comfortable.   7. FOLLOW UP in our office a. Please call [facility] at [phone] to set up an appointment to see your surgeon in the office for a follow-up appointment approximately 2 weeks after your surgery. b. Make sure that you call for this appointment the day you arrive home to insure a convenient appointment time.  9. If you have disability or family leave forms that need to be completed, you may have them completed by your primary care physician's office; for return to work instructions, please ask our office staff and they will be happy to assist you in obtaining this documentation   When to call us [phone]: 1. Poor pain control 2. Reactions / problems with new medications (rash/itching, etc)  3. Fever over 101.5 F (38.5 C) 4. Inability to urinate 5. Nausea/vomiting 6. Worsening swelling or bruising 7. Continued bleeding from incision. 8. Increased pain, redness, or drainage from the incision  The clinic staff is available to answer your questions during regular business hours  (8:30am-5pm).  Please don't hesitate to call and ask to speak to one of our nurses for clinical concerns.   A surgeon from [facility] is always on call at the hospitals   If you have a medical emergency, go to the nearest emergency room or call 911.  [facility], [place] [address], [place], [place], [place]  [address] MAIN: [phone] FAX: [phone] [email or web address]

## 2019-12-22 NOTE — Transfer of Care (Signed)
Immediate Anesthesia Transfer of Care Note  Patient: Brianna Mccarty  Procedure(s) Performed: Procedure(s) (LRB): LAPAROSCOPIC CHOLECYSTECTOMY (N/A)  Patient Location: PACU  Anesthesia Type: General  Level of Consciousness: awake, sedated, patient cooperative and responds to stimulation  Airway & Oxygen Therapy: Patient Spontanous Breathing and Patient connected to [place] 02 and soft FM  Post-op Assessment: Report given to PACU RN, Post -op Vital signs reviewed and stable and Patient moving all extremities  Post vital signs: Reviewed and stable  Complications: No apparent anesthesia complications

## 2019-12-22 NOTE — Anesthesia Postprocedure Evaluation (Signed)
Anesthesia Post Note  Patient: Brianna Mccarty  Procedure(s) Performed: LAPAROSCOPIC CHOLECYSTECTOMY (N/A Abdomen)     Patient location during evaluation: PACU Anesthesia Type: General Level of consciousness: awake and alert, oriented and patient cooperative Pain management: pain level controlled Vital Signs Assessment: post-procedure vital signs reviewed and stable Respiratory status: spontaneous breathing, nonlabored ventilation and respiratory function stable Cardiovascular status: blood pressure returned to baseline and stable Postop Assessment: no apparent nausea or vomiting Anesthetic complications: no    Last Vitals:  Vitals:   12/22/19 1059 12/22/19 1100  BP:  (!) 113/53  Pulse: 82 82  Resp: 13 14  Temp:    SpO2: 93% 97%    Last Pain:  Vitals:   12/22/19 1100  TempSrc:   PainSc: 0-No pain                 Pervis Hocking

## 2019-12-22 NOTE — H&P (Signed)
CC: Referred by primary for right upper quadrant pain and abnormal HIDA scan  HPI: Brianna Mccarty is a very pleasant 37yoF with hx of HTN, HLD, asthma (whom takes daily inhalers) presents to our office as a referral after having a HIDA scan which demonstrated a gallbladder ejection fraction of 15%. Cystic and common bile ducts are patent. She reports a history of right upper quadrant pain that is a dull boring cramp that will last hours to days it has been going on for the last 14 months. This is exacerbated by greasy/fatty food. She reports associated nausea. She denies any history of yellowing of her skin or eyes.  She underwent a complete abdominal ultrasound 11/02/2018 which demonstrated a 1.5 cm lesion until the pancreas; tiny polypoid densities in the gallbladder consistent with gallbladder polyps; Fatty liver; 1.6 and 1.7 centimeter complex right kidney cysts  Subsequent abdominal MRI 11/20/2018 showed numerous pancreatic cystic lesions similar which are minimally complicated. No solid component or suspicious enhancement. Largest lesion is 1.9 cm and the tail. Felt to represent postinflammatory or indolent cystic neoplasm. Follow-up MRI 08/13/2019 showed stability of all these lesions. She is now undergoing serial MRIs for surveillance. Her most recent MRI did also demonstrate small gallbladder polyps. Given her symptoms she also had a HIDA scan done 11/08/2019 which showed the abnormal gallbladder ejection fraction of 15%. There was no evidence of chronic cholecystitis.  Her LFTs have been normal. Back in 2018, she had a mildly elevated lipase on 1 lab check of 57.  INTERVAL HX She denies any changes in her health or health history since we met in the office. Denies any new medications. Has been feeling well. States she is ready for surgery. 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 She reports she is happily retired.  ROS: A comprehensive 10 system review of systems was completed with the patient and pertinent findings as noted above.  Past Medical History:  Diagnosis Date  . Abnormal EKG    Normal LV function in the past  . Allergic rhinitis   . Asthma   . Bronchitis, mucopurulent recurrent ([place])   . Carotid artery disease (HCC)    a. mild by carotid duplex.  . Cervical dysplasia 1971  . Diverticulosis of colon   . DJD (degenerative joint disease)   . GERD (gastroesophageal reflux disease)   . Headache(784.0)   . Hypercholesterolemia   . Hypertension   . Interstitial cystitis    sees urologist  . Lichen sclerosus   . Malignant melanoma ([place]) 2006   sees Dr. Nevada Crane in dermatology  . Migraines   . Mild aortic stenosis   . Mitral valve disease    Question mitral valve prolapse in the past, no prolapse by echo 2009  . Murmur 10/20/2015   SEES DR Meda Coffee  . OSA (obstructive sleep apnea)    USES DENTAL DEVICE  . Osteoporosis    on fosomax > 5 years, stopped 11/2015  . Renal calculus    sees urologist  . Thyroid cyst    1 x 1.1 thyroid cyst noted on carotid Doppler, January, 2012  . Venous insufficiency     Past Surgical History:  Procedure Laterality Date  . BREAST BIOPSY Left 11/25/2011   U/S core, benign performed at [facility], LEFT BREAT MARKER IN  . BREAST CYST ASPIRATION    .  BREAST SURGERY  2013   Breast Bx-Benign  . CARDIAC CATHETERIZATION    . CATARACT EXTRACTION, BILATERAL    . cystoscopy and basket stone removal right ureter  02/2006   Dr. Terance Hart  . GYNECOLOGIC CRYOSURGERY  1971  . left total hip replacement  2004   Dr. Gladstone Lighter  . melanoma removed from medial rleft knee area  2006   Dr. Nevada Crane  . NASAL SEPTUM SURGERY      Family History  Problem Relation Age of Onset  . Cancer Father        Pancreatic  . Diabetes Mother   . Heart disease Mother   . Cancer  Brother        Bile duct  . Diabetes Brother   . Hypertension Brother   . Diabetes Brother   . Heart disease Brother   . Hypertension Brother   . Hypertension Sister   . Hypertension Sister   . Colon cancer Paternal Uncle     Social:  reports that she has never smoked. She has never used smokeless tobacco. She reports current alcohol use of about 1.0 standard drinks of alcohol per week. She reports that she does not use drugs.  Allergies:  Allergies  Allergen Reactions  . Cephalexin     Reaction was a high fever  . Hydrochlorothiazide     hyponatremia  . Montelukast Sodium Other (See Comments)    hyponatremia  . Penicillins Hives and Swelling    Swelling of arms & face Has patient had a PCN reaction causing immediate rash, facial/tongue/throat swelling, SOB or lightheadedness with hypotension: Yes Has patient had a PCN reaction causing severe rash involving mucus membranes or skin necrosis: No Has patient had a PCN reaction that required hospitalization: No Has patient had a PCN reaction occurring within the last 10 years: No If all of the above answers are "NO", then may proceed with Cephalosporin use.   Marland Kitchen Phenobarbital Hives  . Rosuvastatin Other (See Comments)    Reports causes lower extremity muscle aches  . Tape Rash    MEDICAL TAPE    Medications: I have reviewed the patient's current medications.  Results for orders placed or performed during the hospital encounter of 12/22/19 (from the past 48 hour(s))  APTT     Status: None   Collection Time: 12/20/19 10:35 AM  Result Value Ref Range   aPTT 32 24 - 36 seconds    Comment: Performed at [facility], [place] [address] [place], [place] [address]  CBC WITH DIFFERENTIAL     Status: None   Collection Time: 12/20/19 10:35 AM  Result Value Ref Range   WBC 4.3 4.0 - 10.5 K/uL   RBC 4.09 3.87 - 5.11 MIL/uL   Hemoglobin 12.2 12.0 - 15.0 g/dL   HCT 37.2 36.0 - 46.0 %   MCV 91.0 80.0 - 100.0 fL   MCH  29.8 26.0 - 34.0 pg   MCHC 32.8 30.0 - 36.0 g/dL   RDW 12.6 11.5 - 15.5 %   Platelets 252 150 - 400 K/uL   nRBC 0.0 0.0 - 0.2 %   Neutrophils Relative % 57 %   Neutro Abs 2.5 1.7 - 7.7 K/uL   Lymphocytes Relative 27 %   Lymphs Abs 1.2 0.7 - 4.0 K/uL   Monocytes Relative 12 %   Monocytes Absolute 0.5 0.1 - 1.0 K/uL   Eosinophils Relative 2 %   Eosinophils Absolute 0.1 0.0 - 0.5 K/uL   Basophils Relative 1 %  Basophils Absolute 0.0 0.0 - 0.1 K/uL   Immature Granulocytes 1 %   Abs Immature Granulocytes 0.02 0.00 - 0.07 K/uL    Comment: Performed at [facility], [place] [address] [place], [place] [address]  Comprehensive metabolic panel     Status: Abnormal   Collection Time: 12/20/19 10:35 AM  Result Value Ref Range   Sodium 138 135 - 145 mmol/L   Potassium 3.9 3.5 - 5.1 mmol/L   Chloride 103 98 - 111 mmol/L   CO2 26 22 - 32 mmol/L   Glucose, Bld 106 (H) 70 - 99 mg/dL   BUN 19 8 - 23 mg/dL   Creatinine, Ser 0.71 0.44 - 1.00 mg/dL   Calcium 9.7 8.9 - 10.3 mg/dL   Total Protein 6.7 6.5 - 8.1 g/dL   Albumin 4.2 3.5 - 5.0 g/dL   AST 17 15 - 41 U/L   ALT 16 0 - 44 U/L   Alkaline Phosphatase 35 (L) 38 - 126 U/L   Total Bilirubin 0.3 0.3 - 1.2 mg/dL   GFR calc non Af Amer >60 >60 mL/min   GFR calc Af Amer >60 >60 mL/min   Anion gap 9 5 - 15    Comment: Performed at [facility], [place] [address] [place], [place] [address]  Protime-INR     Status: None   Collection Time: 12/20/19 10:35 AM  Result Value Ref Range   Prothrombin Time 13.6 11.4 - 15.2 seconds   INR 1.1 0.8 - 1.2    Comment: (NOTE) INR goal varies based on device and disease states. Performed at [facility], [place] [address], [place], [place] [address]     No results found.  ROS - all of the below systems have been reviewed with the patient and positives are indicated with bold text General: chills, fever or night sweats Eyes: blurry vision or double vision  ENT: epistaxis or sore throat Allergy/Immunology: itchy/watery eyes or nasal congestion Hematologic/Lymphatic: bleeding problems, blood clots or swollen lymph nodes Endocrine: temperature intolerance or unexpected weight changes Breast: new or changing breast lumps or nipple discharge Resp: cough, shortness of breath, or wheezing CV: chest pain or dyspnea on exertion GI: as per HPI GU: dysuria, trouble voiding, or hematuria MSK: joint pain or joint stiffness Neuro: TIA or stroke symptoms Derm: pruritus and skin lesion changes Psych: anxiety and depression  PE Blood pressure (!) 141/68, pulse 69, temperature 97.8 F (36.6 C), temperature source Oral, resp. rate 18, height _0  (1.575 m), weight 83.9 kg, SpO2 99 %. Constitutional: NAD; conversant; no deformities; wearing mask Eyes: Moist conjunctiva; no lid lag; anicteric; PERRL Neck: Trachea midline; no thyromegaly Lungs: Normal respiratory effort; no tactile fremitus CV: RRR; no palpable thrills; no pitting edema GI: Abd soft, NT/ND; no palpable hepatosplenomegaly MSK: Normal gait; no clubbing/cyanosis Psychiatric: Appropriate affect; alert and oriented x3 Lymphatic: No palpable cervical or axillary lymphadenopathy  Results for orders placed or performed during the hospital encounter of 12/22/19 (from the past 48 hour(s))  APTT     Status: None   Collection Time: 12/20/19 10:35 AM  Result Value Ref Range   aPTT 32 24 - 36 seconds    Comment: Performed at [facility], [place] [address], [place], [place] [address]  CBC WITH DIFFERENTIAL     Status: None   Collection Time: 12/20/19 10:35 AM  Result Value Ref Range   WBC 4.3 4.0 - 10.5 K/uL   RBC 4.09 3.87 -  5.11 MIL/uL   Hemoglobin 12.2 12.0 - 15.0 g/dL   HCT 37.2 36.0 - 46.0 %   MCV 91.0 80.0 - 100.0 fL   MCH 29.8 26.0 - 34.0 pg   MCHC 32.8 30.0 - 36.0 g/dL   RDW 12.6 11.5 - 15.5 %   Platelets 252 150 - 400 K/uL   nRBC 0.0 0.0 - 0.2 %   Neutrophils  Relative % 57 %   Neutro Abs 2.5 1.7 - 7.7 K/uL   Lymphocytes Relative 27 %   Lymphs Abs 1.2 0.7 - 4.0 K/uL   Monocytes Relative 12 %   Monocytes Absolute 0.5 0.1 - 1.0 K/uL   Eosinophils Relative 2 %   Eosinophils Absolute 0.1 0.0 - 0.5 K/uL   Basophils Relative 1 %   Basophils Absolute 0.0 0.0 - 0.1 K/uL   Immature Granulocytes 1 %   Abs Immature Granulocytes 0.02 0.00 - 0.07 K/uL    Comment: Performed at [facility], [place] [address], [place], [place] [address]  Comprehensive metabolic panel     Status: Abnormal   Collection Time: 12/20/19 10:35 AM  Result Value Ref Range   Sodium 138 135 - 145 mmol/L   Potassium 3.9 3.5 - 5.1 mmol/L   Chloride 103 98 - 111 mmol/L   CO2 26 22 - 32 mmol/L   Glucose, Bld 106 (H) 70 - 99 mg/dL   BUN 19 8 - 23 mg/dL   Creatinine, Ser 0.71 0.44 - 1.00 mg/dL   Calcium 9.7 8.9 - 10.3 mg/dL   Total Protein 6.7 6.5 - 8.1 g/dL   Albumin 4.2 3.5 - 5.0 g/dL   AST 17 15 - 41 U/L   ALT 16 0 - 44 U/L   Alkaline Phosphatase 35 (L) 38 - 126 U/L   Total Bilirubin 0.3 0.3 - 1.2 mg/dL   GFR calc non Af Amer >60 >60 mL/min   GFR calc Af Amer >60 >60 mL/min   Anion gap 9 5 - 15    Comment: Performed at [facility], [place] [address] [place], [place] [address]  Protime-INR     Status: None   Collection Time: 12/20/19 10:35 AM  Result Value Ref Range   Prothrombin Time 13.6 11.4 - 15.2 seconds   INR 1.1 0.8 - 1.2    Comment: (NOTE) INR goal varies based on device and disease states. Performed at [facility], [place] [address], [place],  [address]     A/P: [name] is a very pleasant 11yoF with hx of HTN, HLD, asthma here for evaluation of crampy colicky RUQ pain that lasts hours to days for last 14 mo. Workup has shown stable pancreatic cysts on abdominal MRI.  -We discussed that gallbladder polyps when tiny often represent cholesterolosis deposits or could actually be polyps - but regardless  with symptoms, recommend surgery to remove the gallbladder -We have reviewed case with our [facility] surgeon, Dr. Barry Dienes - long-term, following gallbladder removal, will need follow-up with her for surveillance -The anatomy and physiology of the hepatobiliary system was discussed at length with the patient. The pathophysiology of gallbladder disease was discussed at length as well. -We discussed some of her symptoms including fact that she is brought some issues in the past with constipation and excess amounts of gas being unlikely to improve following surgery. We specifically discussed that the right upper quadrant pain would be the most likely thing to improve with surgical removal of gallbladder. -The options for  treatment have been reviewed - ongoing observation which may result in subsequent gallbladder complications (infection, pancreatitis, choledocholithiasis, etc); we have also reviewed surgery - cholecystectomy - laparoscopic and potential open techniques -The planned procedure, material risks (including, but not limited to, pain, bleeding, infection, scarring, need for blood transfusion, damage to surrounding structures- blood vessels/nerves/viscus/organs, damage to bile duct, bile leak, need for additional procedures, incomplete resolution of abdominal symptoms, hernia, pancreatitis, pneumonia, heart attack, stroke, death) benefits and alternatives to surgery were discussed at length. I noted a good probability that the procedure would help improve her symptoms as noted above. The patient's questions were answered to her satisfaction, she voiced understanding and elected to proceed with surgery. Additionally, we discussed typical postoperative expectations and the recovery process.  Brianna Mt. Dema Severin, M.D. [facility]

## 2019-12-22 NOTE — Op Note (Signed)
12/22/2019 10:17 AM  PATIENT: Brianna Mccarty  79 y.o. female  Patient Care Team: Caren Macadam, MD as PCP - General (Family Medicine) Dorothy Spark, MD as PCP - Cardiology (Cardiology) Dorothy Spark, MD as Consulting Physician (Cardiology) Ladene Artist, MD as Consulting Physician (Gastroenterology) Festus Aloe, MD as Consulting Physician (Urology) [name], Darrick Grinder, MD as Consulting Physician (Allergy and Immunology) Monna Fam, MD as Consulting Physician (Ophthalmology) [name], MD (Dermatology) [name], MD as Consulting Physician (Orthopedic Surgery)  PRE-OPERATIVE DIAGNOSIS: 1. Gallbladder polyps 2. Biliary dyskinesia  POST-OPERATIVE DIAGNOSIS: Same  PROCEDURE: Laparoscopic cholecystectomy  SURGEON: Sharon Mt. Lorenz Donley, MD  ANESTHESIA: General endotracheal  EBL: Total I/O In: 700 [I.V.:700] Out: 5 [Blood:5]  DRAINS: None  SPECIMEN: Gallbladder  COUNTS: Sponge, needle and instrument counts were reported correct x2 at the conclusion of the operation  DISPOSITION: PACU in satisfactory condition  COMPLICATIONS: None  FINDINGS: Omental adhesions coating gallbladder consistent with likely prior cholecystitis. Liver with rounded edges. Gallbladder otherwise relatively normal in appearance without gross masses or lesions. Liver without any visible surface lesions. Anterior abdominal wall/peritoneum without masses or lesions. Critical view of safety obtained prior to clipping or dividing any structures.  INDICATION: Ms. Rothman is a very pleasant 79yoF with hx of HTN, HLD, asthma (whom takes daily inhalers) presents to our office as a referral after having a HIDA scan which demonstrated a gallbladder ejection fraction of 15%. Cystic and common bile ducts are patent. She reported history of right upper quadrant pain that is a dull boring cramp that will last hours to days it has been going on for the last 14 months. This is  exacerbated by greasy/fatty food. She reports associated nausea. She denies any history of yellowing of her skin or eyes.  She underwent a complete abdominal ultrasound 11/02/2018 which demonstrated a 1.5 cm lesion until the pancreas; tiny polypoid densities in the gallbladder consistent with gallbladder polyps; Fatty liver; 1.6 and 1.7 centimeter complex right kidney cysts  Subsequent abdominal MRI 11/20/2018 showed numerous pancreatic cystic lesions similar which are minimally complicated. No solid component or suspicious enhancement. Largest lesion is 1.9 cm and the tail. Felt to represent postinflammatory or indolent cystic neoplasm. Follow-up MRI 08/13/2019 showed stability of all these lesions. She is now undergoing serial MRIs for surveillance. Her most recent MRI did also demonstrate small gallbladder polyps. Given her symptoms she also had a HIDA scan done 11/08/2019 which showed the abnormal gallbladder ejection fraction of 15%. There was no evidence of chronic cholecystitis.  Her LFTs have been normal. Back in 2018, she had a mildly elevated lipase on 1 lab check of 57.  We had met in the office. We had a long discussion about everything and came to the joint decision to proceed with surgery to address her gallbladder. Please refer to notes elsewhere for details regarding this discussion.  DESCRIPTION:   The patient was identified & brought into the operating room. She was then positioned supine on the OR table. SCDs were in place and active during the entire case. She then underwent general endotracheal anesthesia. Pressure points were padded. The abdomen was prepped and draped in the standard sterile fashion. Antibiotics were administered and an OG tube was placed. A surgical timeout was performed and confirmed our plan.   An infraumbilical incision was made. The umbilical stalk was grasped and retracted outwardly. The infraumbilical fascia was identified and incised. The  peritoneal cavity was gently entered bluntly. A purse-string 0 Vicryl suture  was placed. The Hasson cannula was inserted into the peritoneal cavity and insufflation with CO2 commenced to 42mHg. A laparoscope was inserted into the peritoneal cavity and inspection confirmed no evidence of trocar site complications. The patient was then positioned in reverse Trendelenburg with slight left side down. 3 additional 582mtrocars were placed along the right subcostal line - one 87m14mort in mid subcostal region, another 87mm70mrt in the right flank near the anterior axillary line, and a third 87mm 72mt in the left subxiphoid region obliquely near the falciform ligament.  The anterior wall peritoneum was normal in appearance the lesions or masses.  The omentum was normal in appearance.  The liver and gallbladder were inspected. The liver was enlarged with rounded edges consistent with fatty liver.  There were no masses visualized on the surface of the liver.  There was omentum coating the gallbladder consistent with a prior bout of cholecystitis.  The gallbladder fundus was grasped and elevated cephalad.  Omentum was carefully taken down sharply.  The omentum was then inspected and hemostasis present.  Some flimsy adhesions between the infundibulum of the gallbladder and the duodenum were also lysed sharply using scissors.  The duodenum was preserved free of injury.  An additional grasper was then placed on the infundibulum of the gallbladder and the infundibulum was retracted laterally. Staying high on the gallbladder, the peritoneum on both sides of the gallbladder was opened with hook cautery. Gentle blunt dissection was then employed with a MarylIT consultanting down into [place] One cystic duct was identified and carefully circumferentially dissected. The cystic artery was also identified and carefully circumferentially dissected. The space between the cystic artery and hepatocystic plate was developed  such that a good view of the liver could be seen through a window medial to the cystic artery. The triangle of Calot had been cleared of all fibrofatty tissue. At this point, a critical view of safety was achieved and the only structures visualized was the skeletonized cystic duct laterally, the skeletonized cystic artery and the liver through the window medial to the artery. No posterior cystic artery was noted  A cholangiogram was not performed at this point as the cystic duct was somewhat foreshortened and diminutive - increasing risk for avulsion or backwall injury to common duct with cholangiogram catheter passage.  The cystic duct and artery were clipped with 2 clips on the patient side and 1 clip on the specimen side. The cystic duct and artery were then divided. The gallbladder was then freed from its remaining attachments to the liver using electrocautery and placed into an endocatch bag. The RUQ was gently irrigated with sterile saline. Hemostasis was then verified. The clips were in good position; the gallbladder fossa was dry. The rest of the abdomen was inspected no injury nor bleeding elsewhere was identified.  The endocatch bag containing the gallbladder was then removed from the umbilical port site and passed off as specimen. The RUQ ports were removed under direct visualization and noted to be hemostatic. The umbilical fascia was then closed using the 0 Vicryl purse-string suture. The fascia was palpated and noted to be completely closed. The skin of all incision sites was approximated with 4-0 monocryl subcuticular suture and dermabond applied. She was then awakened from anesthesia, extubated, and transferred to a stretcher for transport to PACU in satisfactory condition.

## 2019-12-23 LAB — SURGICAL PATHOLOGY

## 2019-12-30 ENCOUNTER — Other Ambulatory Visit: Payer: Self-pay | Admitting: Family Medicine

## 2019-12-30 DIAGNOSIS — J3089 Other allergic rhinitis: Secondary | ICD-10-CM | POA: Diagnosis not present

## 2019-12-30 DIAGNOSIS — J301 Allergic rhinitis due to pollen: Secondary | ICD-10-CM | POA: Diagnosis not present

## 2020-01-03 DIAGNOSIS — J3089 Other allergic rhinitis: Secondary | ICD-10-CM | POA: Diagnosis not present

## 2020-01-03 DIAGNOSIS — J301 Allergic rhinitis due to pollen: Secondary | ICD-10-CM | POA: Diagnosis not present

## 2020-01-06 ENCOUNTER — Encounter (INDEPENDENT_AMBULATORY_CARE_PROVIDER_SITE_OTHER): Payer: PPO | Admitting: Ophthalmology

## 2020-01-10 ENCOUNTER — Other Ambulatory Visit: Payer: Self-pay

## 2020-01-11 ENCOUNTER — Other Ambulatory Visit: Payer: Self-pay | Admitting: Family Medicine

## 2020-01-11 ENCOUNTER — Ambulatory Visit (INDEPENDENT_AMBULATORY_CARE_PROVIDER_SITE_OTHER): Payer: PPO | Admitting: Family Medicine

## 2020-01-11 ENCOUNTER — Encounter: Payer: Self-pay | Admitting: Family Medicine

## 2020-01-11 ENCOUNTER — Other Ambulatory Visit: Payer: Self-pay

## 2020-01-11 VITALS — BP 138/72 | HR 65 | Temp 98.0°F | Ht 62.0 in | Wt 182.3 lb

## 2020-01-11 DIAGNOSIS — R5383 Other fatigue: Secondary | ICD-10-CM | POA: Diagnosis not present

## 2020-01-11 DIAGNOSIS — I10 Essential (primary) hypertension: Secondary | ICD-10-CM | POA: Diagnosis not present

## 2020-01-11 DIAGNOSIS — R000 Tachycardia, unspecified: Secondary | ICD-10-CM

## 2020-01-11 DIAGNOSIS — J301 Allergic rhinitis due to pollen: Secondary | ICD-10-CM | POA: Diagnosis not present

## 2020-01-11 DIAGNOSIS — J3089 Other allergic rhinitis: Secondary | ICD-10-CM | POA: Diagnosis not present

## 2020-01-11 NOTE — Progress Notes (Signed)
Subjective:     Patient ID: Brianna Mccarty, female   DOB: 11-Mar-1941, 79 y.o.   MRN: [id number]  HPI   [name] had recent cholecystectomy for acalculus cholecystitis.  She had abnormal HIDA scan.  Her surgery went well and she has had no significant problems since then except for episode of fatigue and weakness on Saturday.  She states she woke up feeling weak and fatigued.  There was no chest pain.  No dizziness.  No syncope.  Her blood pressure was 113/52 which is slightly low for her with heart rate of 56.  On Sunday she held her hydralazine and Saturday night had gone out to eat and perhaps a lot of sodium.  By Monday her blood pressure was elevated with readings 160/101 and 174/89.  She also noted heart rate of 122 and 131 by her monitor.  She did not actually count her pulse but this was per her blood pressure monitor.  She has hypertension treated with multiple medications including amlodipine, hydralazine, losartan, and prazosin.  Except for holding her hydralazine Sunday she has been compliant with her medications.  She had TSH back in July which was normal.  History of mild aortic stenosis with most recent echo 8/19.  She states she feels fine today.  Has not had any further issues with blood pressure instability or pulse.  She did not have any subjective palpitations when her heart rate was registering at 122 recently.  No associated dizziness.  No known history of A. Fib.  Past Medical History:  Diagnosis Date  . Abnormal EKG    Normal LV function in the past  . Allergic rhinitis   . Asthma   . Bronchitis, mucopurulent recurrent ([place])   . Carotid artery disease (HCC)    a. mild by carotid duplex.  . Cervical dysplasia 1971  . Diverticulosis of colon   . DJD (degenerative joint disease)   . GERD (gastroesophageal reflux disease)   . Headache(784.0)   . Hypercholesterolemia   . Hypertension   . Interstitial cystitis    sees urologist  . Lichen sclerosus   . Malignant  melanoma ([place]) 2006   sees Dr. [place] Crane in dermatology  . Migraines   . Mild aortic stenosis   . Mitral valve disease    Question mitral valve prolapse in the past, no prolapse by echo 2009  . Murmur 10/20/2015   SEES DR Meda Coffee  . OSA (obstructive sleep apnea)    USES DENTAL DEVICE  . Osteoporosis    on fosomax > 5 years, stopped 11/2015  . Renal calculus    sees urologist  . Thyroid cyst    1 x 1.1 thyroid cyst noted on carotid Doppler, January, 2012  . Venous insufficiency    Past Surgical History:  Procedure Laterality Date  . BREAST BIOPSY Left 11/25/2011   U/S core, benign performed at [facility], LEFT BREAT MARKER IN  . BREAST CYST ASPIRATION    . BREAST SURGERY  2013   Breast Bx-Benign  . CARDIAC CATHETERIZATION    . CATARACT EXTRACTION, BILATERAL    . CHOLECYSTECTOMY N/A 12/22/2019   Procedure: LAPAROSCOPIC CHOLECYSTECTOMY;  Surgeon: Ileana Roup, MD;  Location: [facility];  Service: General;  Laterality: N/A;  . cystoscopy and basket stone removal right ureter  02/2006   Dr. Terance Hart  . GALLBLADDER SURGERY  12/22/2019  . GYNECOLOGIC CRYOSURGERY  1971  . left total hip replacement  2004   Dr. Gladstone Lighter  . melanoma removed  from medial rleft knee area  2006   Dr. Nevada Crane  . NASAL SEPTUM SURGERY      reports that she has never smoked. She has never used smokeless tobacco. She reports current alcohol use of about 1.0 standard drinks of alcohol per week. She reports that she does not use drugs. family history includes Cancer in her brother and father; Colon cancer in her paternal uncle; Diabetes in her brother, brother, and mother; Heart disease in her brother and mother; Hypertension in her brother, brother, sister, and sister. Allergies  Allergen Reactions  . Cephalexin     Reaction was a high fever  . Hydrochlorothiazide     hyponatremia  . Montelukast Sodium Other (See Comments)    hyponatremia  . Penicillins Hives and Swelling    Swelling of arms &  face Has patient had a PCN reaction causing immediate rash, facial/tongue/throat swelling, SOB or lightheadedness with hypotension: Yes Has patient had a PCN reaction causing severe rash involving mucus membranes or skin necrosis: No Has patient had a PCN reaction that required hospitalization: No Has patient had a PCN reaction occurring within the last 10 years: No If all of the above answers are "NO", then may proceed with Cephalosporin use.   Marland Kitchen Phenobarbital Hives  . Rosuvastatin Other (See Comments)    Reports causes lower extremity muscle aches  . Tape Rash    MEDICAL TAPE     Review of Systems  Constitutional: Positive for fatigue. Negative for chills and fever.  Eyes: Negative for visual disturbance.  Respiratory: Negative for cough and shortness of breath.   Cardiovascular: Negative for chest pain, palpitations and leg swelling.  Gastrointestinal: Negative for abdominal pain.  Genitourinary: Negative for dysuria.  Neurological: Negative for dizziness, syncope and headaches.  Psychiatric/Behavioral: Negative for confusion.       Objective:   Physical Exam Vitals reviewed.  Constitutional:      Appearance: Normal appearance.  Cardiovascular:     Rate and Rhythm: Normal rate and regular rhythm.     Heart sounds: Murmur present.     Comments: She has fairly prominent murmur over aortic valve area which is chronic Pulmonary:     Effort: Pulmonary effort is normal.     Breath sounds: Normal breath sounds.  Musculoskeletal:     Right lower leg: No edema.     Left lower leg: No edema.  Neurological:     Mental Status: She is alert.        Assessment:     #1 hypertension.  Currently well controlled.  Repeat left arm after rest by me 130/68.  We did compare with her cuffs and she had readings of 150/79 and 149/72 so her home cuff is apparently reading high.  #2 possible transient tachycardia by home monitor.  She had recent elevated readings as above with heart rate  122 and 131 by her home blood pressure monitor but otherwise asymptomatic.  Her pulse is normal today and regular    Plan:     -We suggest that she consider new blood pressure monitor for home monitoring  -Be in touch for any pulse elevated over 110  -Continue to monitor blood pressure and be in touch if she is having consistent readings greater than 140/90  -Consider cardiac event monitor if she has any further elevated pulse readings and we instructed her how to check this by palpation  Eulas Post MD [facility]

## 2020-01-11 NOTE — Patient Instructions (Signed)
Consider getting new home BP monitor  Monitor pulse and be in touch if any further episodes of elevated pulse (over 110)

## 2020-01-17 ENCOUNTER — Encounter (INDEPENDENT_AMBULATORY_CARE_PROVIDER_SITE_OTHER): Payer: PPO | Admitting: Ophthalmology

## 2020-01-17 ENCOUNTER — Other Ambulatory Visit: Payer: Self-pay | Admitting: Family Medicine

## 2020-01-19 ENCOUNTER — Other Ambulatory Visit: Payer: Self-pay

## 2020-01-20 ENCOUNTER — Ambulatory Visit (INDEPENDENT_AMBULATORY_CARE_PROVIDER_SITE_OTHER): Payer: PPO | Admitting: Obstetrics and Gynecology

## 2020-01-20 ENCOUNTER — Encounter: Payer: Self-pay | Admitting: Obstetrics and Gynecology

## 2020-01-20 VITALS — BP 124/84 | Ht 62.0 in | Wt 181.0 lb

## 2020-01-20 DIAGNOSIS — M810 Age-related osteoporosis without current pathological fracture: Secondary | ICD-10-CM | POA: Diagnosis not present

## 2020-01-20 DIAGNOSIS — L900 Lichen sclerosus et atrophicus: Secondary | ICD-10-CM | POA: Diagnosis not present

## 2020-01-20 DIAGNOSIS — Z01419 Encounter for gynecological examination (general) (routine) without abnormal findings: Secondary | ICD-10-CM | POA: Diagnosis not present

## 2020-01-20 MED ORDER — CLOBETASOL PROPIONATE 0.05 % EX CREA: 1.0000 "application " | TOPICAL_CREAM | CUTANEOUS | 2 refills | Status: DC | PRN

## 2020-01-20 NOTE — Progress Notes (Addendum)
Brianna Mccarty June 22, 1941 [id number]  SUBJECTIVE:  79 y.o. G2P2002 female for annual routine gynecologic exam. Titanium clip left breast causing some occasional pains.  Having some itching over vulva, is out of clobetasol. Otherwise she has no gynecologic concerns.   Current Outpatient Medications  Medication Sig Dispense Refill  . albuterol (PROAIR HFA) 108 (90 Base) MCG/ACT inhaler Inhale 2 puffs into the lungs every 6 (six) hours as needed for wheezing. 3 Inhaler 3  . albuterol (PROVENTIL) (2.5 MG/3ML) 0.083% nebulizer solution Take 3 mLs (2.5 mg total) by nebulization every 6 (six) hours as needed. 360 mL 3  . amLODipine (NORVASC) 10 MG tablet TAKE 1 TABLET BY MOUTH EVERY DAY 90 tablet 0  . Ascorbic Acid (VITAMIN C) 500 MG tablet Take 500 mg by mouth daily.      Marland Kitchen aspirin 81 MG tablet Take 81 mg by mouth daily.      Marland Kitchen aspirin-acetaminophen-caffeine (EXCEDRIN MIGRAINE) 250-250-65 MG tablet Take by mouth every 6 (six) hours as needed for headache.    . Bempedoic Acid (NEXLETOL) 180 MG TABS Take 1 tablet by mouth daily. (Patient taking differently: Take 1 tablet by mouth at bedtime. ) 30 tablet 11  . budesonide-formoterol (SYMBICORT) 160-4.5 MCG/ACT inhaler Inhale 2 puffs 2 (two) times daily into the lungs. 3 Inhaler 3  . Calcium Carbonate (CALCIUM 600) 1500 MG TABS Take 1 tablet by mouth daily.      . Cholecalciferol (VITAMIN D3) 5000 UNITS CAPS Take 5,000 Units by mouth daily.     . Cinnamon 500 MG TABS Take 1 tablet by mouth daily.     . clindamycin (CLEOCIN) 300 MG capsule FOR DENTAL WORK TAKES ALL 1 HOUR BEFORE DENTAL WORK    . Coenzyme Q10 (CO Q-10) 200 MG CAPS Take 200 mg by mouth daily. 30 each 0  . desmopressin (DDAVP) 0.2 MG tablet Take 0.6 mg by mouth at bedtime.     Marland Kitchen EPINEPHrine 0.3 mg/0.3 mL IJ SOAJ injection See admin instructions.    . fexofenadine (ALLEGRA) 180 MG tablet Take 180 mg by mouth daily.     . furosemide (LASIX) 20 MG tablet TAKE 1 TABLET BY MOUTH EVERY DAY 90  tablet 2  . hydrALAZINE (APRESOLINE) 50 MG tablet TAKE 1 TABLET BY MOUTH 3 TIMES A DAY AND TAKE EXTRA TABLET IF SBP>160 270 tablet 1  . hydrOXYzine (ATARAX/VISTARIL) 10 MG tablet Take 10 mg by mouth at bedtime.     Marland Kitchen losartan (COZAAR) 100 MG tablet TAKE 1 TABLET BY MOUTH EVERY DAY *NEEDS APPT* 90 tablet 0  . magnesium oxide (MAG-OX) 400 MG tablet Take 400 mg by mouth daily.      . Multiple Vitamins-Minerals (ICAPS AREDS 2 PO) Take by mouth daily.    Marland Kitchen omeprazole (PRILOSEC) 40 MG capsule TAKE 1 CAPSULE BY MOUTH EVERY DAY.**PATIENT NEEDS APPT PER DOCTOR** 90 capsule 0  . pentosan polysulfate (ELMIRON) 100 MG capsule Take 100 mg by mouth 3 (three) times daily before meals.      . prazosin (MINIPRESS) 5 MG capsule TAKE 1 CAPSULE BY MOUTH TWICE A DAY 60 capsule 0  . Probiotic Product (PROBIOTIC PO) Take 1 capsule by mouth daily.     Marland Kitchen UNABLE TO FIND Med Name: JUICE PLUS TAKES 6 CAPS PER DAY    . UNABLE TO FIND Med Name: NOPOLEA 2 OUNCES PER DAY    . UNABLE TO FIND Med Name: JUICE PLUS OMEGA 2 PER DAY    . valACYclovir (VALTREX) 1000 MG  tablet as needed.    Marland Kitchen azelastine (ASTELIN) 0.1 % nasal spray Instill 2 sprays into each nostril two times a day as needed for allergies  5  . miconazole (MICOTIN) 2 % powder Apply topically as needed for itching. (Patient not taking: Reported on 01/20/2020) 70 g 0  . triamcinolone ointment (KENALOG) 0.5 % Apply 1 application topically 2 (two) times daily. (Patient not taking: Reported on 01/20/2020) 30 g 0   No current facility-administered medications for this visit.   Allergies: Cephalexin, Hydrochlorothiazide, Montelukast sodium, Penicillins, Phenobarbital, Rosuvastatin, and Tape  No LMP recorded. Patient is postmenopausal.  Past medical history,surgical history, problem list, medications, allergies, family history and social history were all reviewed and documented as reviewed in the EPIC chart.  ROS:  Feeling well. No dyspnea or chest pain on exertion.  No  abdominal pain, change in bowel habits, black or bloody stools.  No urinary tract symptoms. GYN ROS: no abnormal bleeding, pelvic pain or discharge, no breast pain or new or enlarging lumps on self exam. No neurological complaints.   OBJECTIVE:  BP 124/84   Ht 5\' 2"  (1.575 m)   Wt 181 lb (82.1 kg)   BMI 33.11 kg/m  The patient appears well, alert, oriented x 3, in no distress. ENT normal.  Neck supple. No cervical or supraclavicular adenopathy or thyromegaly.  Lungs are clear, good air entry, no wheezes, rhonchi or rales. S1 and S2 normal, 3/6 systolic murmur, regular rate and rhythm.  Abdomen soft without tenderness, guarding, mass or organomegaly.  Neurological is normal, no focal findings.  BREAST EXAM: breasts appear normal, no suspicious masses, no skin or nipple changes or axillary nodes  PELVIC EXAM: VULVA: normal atrophic appearing vulva with no masses, tenderness or lesions, parchment white/blanching of skin over posterior fourchette bilaterally and also small amounts over the anterior labia as well, appearance consistent with lichen sclerosis, VAGINA: normal appearing vagina with normal color and discharge, no lesions, CERVIX: normal atrophic appearing cervix without discharge or lesions, UTERUS: uterus is normal size, shape, consistency and nontender, ADNEXA: normal adnexa in size, nontender and no masses, RECTAL: normal rectal, no masses  Chaperone: [name] present during the examination  ASSESSMENT:  79 y.o. [id number] here for annual gynecologic exam  PLAN:   1. Postmenopausal.  No significant hot flashes, no postmenopausal bleeding. 2. Pap smear 06/2015.  History of cryosurgery 40 years ago, normal Pap smears since then.  She is comfortable with discontinuing screening per the current guidelines based on age. 3.  Lichen sclerosis.  The patient intermittently uses clobetasol cream with good results, she does request a refill.  60 g refill provided with 2 refills. 4.  Mammogram 01/2019.  Normal breast exam today.  She has an annual mammogram scheduled in the upcoming weeks.  Encouraged to ask about her clip and whether it is likely to be causing her symptoms. 5. Colonoscopy 2016.  Recommended that she follow up at the recommended interval.  6. Osteoporosis.  Actively being followed by her primary doctor.  Previously on Fosamax.  DEXA 01/2017 T score -1.8.  FRAX recognizing she had been on Fosamax 12% / 3%.  She will continue to follow-up with her primary doctor for bone health. 7. Health maintenance.  No labs today as she normally has these completed with her primary care doctor.  Previous notation of mild aortic stenosis and heart murmur noted and she is followed by a primary physician and cardiologist with recent cardiac testing in 2019.  Return annually or  sooner, prn.  Joseph Pierini MD, FACOG  01/20/20

## 2020-01-22 ENCOUNTER — Other Ambulatory Visit: Payer: Self-pay | Admitting: Family Medicine

## 2020-01-24 ENCOUNTER — Other Ambulatory Visit: Payer: Self-pay | Admitting: Surgery

## 2020-01-24 DIAGNOSIS — K862 Cyst of pancreas: Secondary | ICD-10-CM

## 2020-01-25 ENCOUNTER — Encounter: Payer: Self-pay | Admitting: Obstetrics and Gynecology

## 2020-01-25 DIAGNOSIS — J3089 Other allergic rhinitis: Secondary | ICD-10-CM | POA: Diagnosis not present

## 2020-01-25 DIAGNOSIS — Z1231 Encounter for screening mammogram for malignant neoplasm of breast: Secondary | ICD-10-CM | POA: Diagnosis not present

## 2020-01-25 DIAGNOSIS — J301 Allergic rhinitis due to pollen: Secondary | ICD-10-CM | POA: Diagnosis not present

## 2020-01-27 ENCOUNTER — Other Ambulatory Visit: Payer: Self-pay

## 2020-01-27 ENCOUNTER — Encounter (INDEPENDENT_AMBULATORY_CARE_PROVIDER_SITE_OTHER): Payer: PPO | Admitting: Ophthalmology

## 2020-01-27 DIAGNOSIS — H35341 Macular cyst, hole, or pseudohole, right eye: Secondary | ICD-10-CM | POA: Diagnosis not present

## 2020-01-27 DIAGNOSIS — H35372 Puckering of macula, left eye: Secondary | ICD-10-CM

## 2020-01-27 DIAGNOSIS — I10 Essential (primary) hypertension: Secondary | ICD-10-CM | POA: Diagnosis not present

## 2020-01-27 DIAGNOSIS — H35033 Hypertensive retinopathy, bilateral: Secondary | ICD-10-CM | POA: Diagnosis not present

## 2020-01-27 DIAGNOSIS — D3132 Benign neoplasm of left choroid: Secondary | ICD-10-CM

## 2020-01-27 DIAGNOSIS — H353112 Nonexudative age-related macular degeneration, right eye, intermediate dry stage: Secondary | ICD-10-CM | POA: Diagnosis not present

## 2020-01-27 DIAGNOSIS — H33303 Unspecified retinal break, bilateral: Secondary | ICD-10-CM | POA: Diagnosis not present

## 2020-01-27 DIAGNOSIS — D3131 Benign neoplasm of right choroid: Secondary | ICD-10-CM | POA: Diagnosis not present

## 2020-02-01 ENCOUNTER — Other Ambulatory Visit: Payer: Self-pay | Admitting: Family Medicine

## 2020-02-01 DIAGNOSIS — J301 Allergic rhinitis due to pollen: Secondary | ICD-10-CM | POA: Diagnosis not present

## 2020-02-01 DIAGNOSIS — J3089 Other allergic rhinitis: Secondary | ICD-10-CM | POA: Diagnosis not present

## 2020-02-10 DIAGNOSIS — J301 Allergic rhinitis due to pollen: Secondary | ICD-10-CM | POA: Diagnosis not present

## 2020-02-10 DIAGNOSIS — J3089 Other allergic rhinitis: Secondary | ICD-10-CM | POA: Diagnosis not present

## 2020-02-16 ENCOUNTER — Other Ambulatory Visit: Payer: Self-pay | Admitting: Family Medicine

## 2020-02-17 DIAGNOSIS — J3089 Other allergic rhinitis: Secondary | ICD-10-CM | POA: Diagnosis not present

## 2020-02-23 DIAGNOSIS — J301 Allergic rhinitis due to pollen: Secondary | ICD-10-CM | POA: Diagnosis not present

## 2020-02-23 DIAGNOSIS — J3089 Other allergic rhinitis: Secondary | ICD-10-CM | POA: Diagnosis not present

## 2020-02-25 ENCOUNTER — Inpatient Hospital Stay: Admission: RE | Admit: 2020-02-25 | Payer: PPO | Source: Ambulatory Visit

## 2020-03-03 DIAGNOSIS — J301 Allergic rhinitis due to pollen: Secondary | ICD-10-CM | POA: Diagnosis not present

## 2020-03-03 DIAGNOSIS — J3089 Other allergic rhinitis: Secondary | ICD-10-CM | POA: Diagnosis not present

## 2020-03-10 ENCOUNTER — Other Ambulatory Visit: Payer: Self-pay | Admitting: Family Medicine

## 2020-03-10 ENCOUNTER — Other Ambulatory Visit: Payer: PPO

## 2020-03-13 ENCOUNTER — Other Ambulatory Visit: Payer: Self-pay | Admitting: Family Medicine

## 2020-03-13 DIAGNOSIS — J3089 Other allergic rhinitis: Secondary | ICD-10-CM | POA: Diagnosis not present

## 2020-03-13 DIAGNOSIS — J301 Allergic rhinitis due to pollen: Secondary | ICD-10-CM | POA: Diagnosis not present

## 2020-03-22 DIAGNOSIS — J3089 Other allergic rhinitis: Secondary | ICD-10-CM | POA: Diagnosis not present

## 2020-03-22 DIAGNOSIS — J301 Allergic rhinitis due to pollen: Secondary | ICD-10-CM | POA: Diagnosis not present

## 2020-03-22 DIAGNOSIS — H1045 Other chronic allergic conjunctivitis: Secondary | ICD-10-CM | POA: Diagnosis not present

## 2020-03-22 DIAGNOSIS — K219 Gastro-esophageal reflux disease without esophagitis: Secondary | ICD-10-CM | POA: Diagnosis not present

## 2020-03-23 DIAGNOSIS — J301 Allergic rhinitis due to pollen: Secondary | ICD-10-CM | POA: Diagnosis not present

## 2020-03-23 DIAGNOSIS — J3089 Other allergic rhinitis: Secondary | ICD-10-CM | POA: Diagnosis not present

## 2020-03-26 ENCOUNTER — Other Ambulatory Visit: Payer: PPO

## 2020-03-27 DIAGNOSIS — J3089 Other allergic rhinitis: Secondary | ICD-10-CM | POA: Diagnosis not present

## 2020-03-27 DIAGNOSIS — J301 Allergic rhinitis due to pollen: Secondary | ICD-10-CM | POA: Diagnosis not present

## 2020-03-28 DIAGNOSIS — Z08 Encounter for follow-up examination after completed treatment for malignant neoplasm: Secondary | ICD-10-CM | POA: Diagnosis not present

## 2020-03-28 DIAGNOSIS — Z86006 Personal history of melanoma in-situ: Secondary | ICD-10-CM | POA: Diagnosis not present

## 2020-03-28 DIAGNOSIS — Z1283 Encounter for screening for malignant neoplasm of skin: Secondary | ICD-10-CM | POA: Diagnosis not present

## 2020-03-28 DIAGNOSIS — L821 Other seborrheic keratosis: Secondary | ICD-10-CM | POA: Diagnosis not present

## 2020-03-30 DIAGNOSIS — J3089 Other allergic rhinitis: Secondary | ICD-10-CM | POA: Diagnosis not present

## 2020-03-30 DIAGNOSIS — J301 Allergic rhinitis due to pollen: Secondary | ICD-10-CM | POA: Diagnosis not present

## 2020-04-05 DIAGNOSIS — J301 Allergic rhinitis due to pollen: Secondary | ICD-10-CM | POA: Diagnosis not present

## 2020-04-05 DIAGNOSIS — J3089 Other allergic rhinitis: Secondary | ICD-10-CM | POA: Diagnosis not present

## 2020-04-06 ENCOUNTER — Other Ambulatory Visit: Payer: Self-pay | Admitting: Family Medicine

## 2020-04-07 DIAGNOSIS — J3089 Other allergic rhinitis: Secondary | ICD-10-CM | POA: Diagnosis not present

## 2020-04-07 DIAGNOSIS — J301 Allergic rhinitis due to pollen: Secondary | ICD-10-CM | POA: Diagnosis not present

## 2020-04-11 ENCOUNTER — Other Ambulatory Visit: Payer: Self-pay | Admitting: Family Medicine

## 2020-04-11 DIAGNOSIS — J3089 Other allergic rhinitis: Secondary | ICD-10-CM | POA: Diagnosis not present

## 2020-04-11 DIAGNOSIS — J301 Allergic rhinitis due to pollen: Secondary | ICD-10-CM | POA: Diagnosis not present

## 2020-04-15 ENCOUNTER — Other Ambulatory Visit: Payer: Self-pay | Admitting: Family Medicine

## 2020-04-17 ENCOUNTER — Ambulatory Visit
Admission: RE | Admit: 2020-04-17 | Discharge: 2020-04-17 | Disposition: A | Discharge status: Home / Self Care | Payer: PPO | Source: Ambulatory Visit | Attending: Surgery | Admitting: Surgery

## 2020-04-17 DIAGNOSIS — K862 Cyst of pancreas: Secondary | ICD-10-CM | POA: Diagnosis not present

## 2020-04-17 MED ORDER — GADOBENATE DIMEGLUMINE 529 MG/ML IV SOLN
17.0000 mL | Freq: Once | INTRAVENOUS | Status: AC | PRN
  Administered 2020-04-17: 17 mL via INTRAVENOUS

## 2020-04-18 ENCOUNTER — Other Ambulatory Visit: Payer: Self-pay | Admitting: Cardiovascular Disease

## 2020-04-21 DIAGNOSIS — J3089 Other allergic rhinitis: Secondary | ICD-10-CM | POA: Diagnosis not present

## 2020-04-21 DIAGNOSIS — J301 Allergic rhinitis due to pollen: Secondary | ICD-10-CM | POA: Diagnosis not present

## 2020-04-22 ENCOUNTER — Other Ambulatory Visit: Payer: Self-pay | Admitting: Family Medicine

## 2020-04-24 DIAGNOSIS — J3089 Other allergic rhinitis: Secondary | ICD-10-CM | POA: Diagnosis not present

## 2020-04-24 DIAGNOSIS — J301 Allergic rhinitis due to pollen: Secondary | ICD-10-CM | POA: Diagnosis not present

## 2020-04-28 DIAGNOSIS — Z800 Family history of malignant neoplasm of digestive organs: Secondary | ICD-10-CM | POA: Diagnosis not present

## 2020-04-28 DIAGNOSIS — K862 Cyst of pancreas: Secondary | ICD-10-CM | POA: Diagnosis not present

## 2020-05-01 DIAGNOSIS — J301 Allergic rhinitis due to pollen: Secondary | ICD-10-CM | POA: Diagnosis not present

## 2020-05-01 DIAGNOSIS — J3089 Other allergic rhinitis: Secondary | ICD-10-CM | POA: Diagnosis not present

## 2020-05-03 ENCOUNTER — Other Ambulatory Visit: Payer: Self-pay | Admitting: Family Medicine

## 2020-05-09 DIAGNOSIS — J3089 Other allergic rhinitis: Secondary | ICD-10-CM | POA: Diagnosis not present

## 2020-05-09 DIAGNOSIS — J301 Allergic rhinitis due to pollen: Secondary | ICD-10-CM | POA: Diagnosis not present

## 2020-05-12 ENCOUNTER — Encounter: Payer: Self-pay | Admitting: Family Medicine

## 2020-05-12 ENCOUNTER — Other Ambulatory Visit: Payer: Self-pay

## 2020-05-12 ENCOUNTER — Ambulatory Visit (INDEPENDENT_AMBULATORY_CARE_PROVIDER_SITE_OTHER): Payer: PPO | Admitting: Family Medicine

## 2020-05-12 VITALS — BP 128/70 | HR 70 | Temp 97.9°F | Ht 62.0 in | Wt 182.4 lb

## 2020-05-12 DIAGNOSIS — R739 Hyperglycemia, unspecified: Secondary | ICD-10-CM | POA: Diagnosis not present

## 2020-05-12 DIAGNOSIS — E7800 Pure hypercholesterolemia, unspecified: Secondary | ICD-10-CM

## 2020-05-12 DIAGNOSIS — R1011 Right upper quadrant pain: Secondary | ICD-10-CM | POA: Diagnosis not present

## 2020-05-12 DIAGNOSIS — I779 Disorder of arteries and arterioles, unspecified: Secondary | ICD-10-CM

## 2020-05-12 DIAGNOSIS — I10 Essential (primary) hypertension: Secondary | ICD-10-CM

## 2020-05-12 DIAGNOSIS — I872 Venous insufficiency (chronic) (peripheral): Secondary | ICD-10-CM | POA: Diagnosis not present

## 2020-05-12 DIAGNOSIS — Z85820 Personal history of malignant melanoma of skin: Secondary | ICD-10-CM

## 2020-05-12 DIAGNOSIS — K219 Gastro-esophageal reflux disease without esophagitis: Secondary | ICD-10-CM

## 2020-05-12 DIAGNOSIS — R0602 Shortness of breath: Secondary | ICD-10-CM

## 2020-05-12 DIAGNOSIS — Z8619 Personal history of other infectious and parasitic diseases: Secondary | ICD-10-CM

## 2020-05-12 DIAGNOSIS — J4530 Mild persistent asthma, uncomplicated: Secondary | ICD-10-CM

## 2020-05-12 DIAGNOSIS — H3530 Unspecified macular degeneration: Secondary | ICD-10-CM | POA: Diagnosis not present

## 2020-05-12 DIAGNOSIS — E538 Deficiency of other specified B group vitamins: Secondary | ICD-10-CM

## 2020-05-12 DIAGNOSIS — J309 Allergic rhinitis, unspecified: Secondary | ICD-10-CM

## 2020-05-12 DIAGNOSIS — R5383 Other fatigue: Secondary | ICD-10-CM | POA: Diagnosis not present

## 2020-05-12 DIAGNOSIS — G4733 Obstructive sleep apnea (adult) (pediatric): Secondary | ICD-10-CM

## 2020-05-12 DIAGNOSIS — M810 Age-related osteoporosis without current pathological fracture: Secondary | ICD-10-CM | POA: Diagnosis not present

## 2020-05-12 DIAGNOSIS — N3010 Interstitial cystitis (chronic) without hematuria: Secondary | ICD-10-CM

## 2020-05-12 NOTE — Progress Notes (Signed)
Brianna Mccarty DOB: [birth year] Encounter date: 05/12/2020  This is a 79 y.o. female who presents with Chief Complaint  Patient presents with  . Follow-up    History of present illness:  She has been following with Dr. Fredderick Phenix for allergy care. Follows with Dr. Delilah Shan for gynecology needs.  Last visit was March/2021.  Last visit with me was April 2020.  Hypertension: Blood pressure has been more elevated.  She is concerned with numbers that she has been getting.  She brought in our readings from beginning of the month until apparently and numbers range from 114/50-162/87.  In the next day, pressures have been lower in the 120s to 140s over 50-70.  She attributes improvement in blood pressure to increase walking that she has been doing.  Recheck of blood pressure in the office today: Right arm 100/50, left arm 130/60.  Blood pressure slightly more difficult to hear on the right than left.  Recheck with patient's home cuff demonstrated blood pressure of 137/71 on the right with heart rate of 63, and 137/58 with heart rate of 62 on the left.  Venous insufficiency/carotid artery disease/mild aortic stenosis: Follows with cardiology regularly.  Plan is to repeat echo around 2022-2024.  Asthma: Taking Symbicort BID. Feels like she has been more short of breath. Doesn't feel like she has same stamina. Occasionally has to use breathing machine - used a lot this spring. In last couple months not used more than a few times. No leg swelling; no increased work of breathing with lying down.   Sleep apnea, obstructive: using dental device which is working well for her.   GERD: Following with GI.  Had laparoscopic cholecystectomy in February/2021. Still states that stomach doesn't feel great. Still some RUQ discomfort. Saw pancreatic surgeon as well and was reassured that cysts were non-cancerous. Was told they would check her again in 2 years. Is trying to get in touch with genetics - it was recommended  that she have testing due to family history. Has to use miralax nightly or she gets constipated. Feels bloated. Sometimes a little nauseated (but better than before gallbladder was taken out). Discomfort in right side has decreased since surgery. Now comes and goes but no rhyme/reason. Would still hurt if she didn't eat.   Melanoma: follows with Dr. Nevada Crane regularly.  Osteoporosis:not on treatment currently; didn't hear about dexa scheduling.  Interstitial cystitis: on Elmiron.  States that this medication is working well for her and urinary symptoms controlled.  Hyperlipidemia: on pravastatin 20mg  nightly  Hyperglycemia: last A1C 08/2018 was 5.9  Has arthritis that is painful in nearly all joints, hands.  Between arthritis and breathing, she does have difficulty with significant exertion, walking long distances.  She is asking a UTI parking pass today.    Allergies  Allergen Reactions  . Cephalexin     Reaction was a high fever  . Hydrochlorothiazide     hyponatremia  . Montelukast Sodium Other (See Comments)    hyponatremia  . Penicillins Hives and Swelling    Swelling of arms & face Has patient had a PCN reaction causing immediate rash, facial/tongue/throat swelling, SOB or lightheadedness with hypotension: Yes Has patient had a PCN reaction causing severe rash involving mucus membranes or skin necrosis: No Has patient had a PCN reaction that required hospitalization: No Has patient had a PCN reaction occurring within the last 10 years: No If all of the above answers are "NO", then may proceed with Cephalosporin use.   Marland Kitchen Phenobarbital  Hives  . Rosuvastatin Other (See Comments)    Reports causes lower extremity muscle aches  . Tape Rash    MEDICAL TAPE   Current Meds  Medication Sig  . albuterol (PROAIR HFA) 108 (90 Base) MCG/ACT inhaler Inhale 2 puffs into the lungs every 6 (six) hours as needed for wheezing.  Marland Kitchen albuterol (PROVENTIL) (2.5 MG/3ML) 0.083% nebulizer solution  Take 3 mLs (2.5 mg total) by nebulization every 6 (six) hours as needed.  Marland Kitchen amLODipine (NORVASC) 10 MG tablet TAKE 1 TABLET BY MOUTH EVERY DAY  . Ascorbic Acid (VITAMIN C) 500 MG tablet Take 500 mg by mouth daily.    Marland Kitchen aspirin 81 MG tablet Take 81 mg by mouth daily.    Marland Kitchen aspirin-acetaminophen-caffeine (EXCEDRIN MIGRAINE) 250-250-65 MG tablet Take by mouth every 6 (six) hours as needed for headache.  Marland Kitchen azelastine (ASTELIN) 0.1 % nasal spray Instill 2 sprays into each nostril two times a day as needed for allergies  . Bempedoic Acid (NEXLETOL) 180 MG TABS Take 1 tablet by mouth daily. (Patient taking differently: Take 1 tablet by mouth at bedtime. )  . budesonide-formoterol (SYMBICORT) 160-4.5 MCG/ACT inhaler Inhale 2 puffs 2 (two) times daily into the lungs.  . Calcium Carbonate (CALCIUM 600) 1500 MG TABS Take 1 tablet by mouth daily.    . Cholecalciferol (VITAMIN D3) 5000 UNITS CAPS Take 5,000 Units by mouth daily.   . Cinnamon 500 MG TABS Take 1 tablet by mouth daily.   . clindamycin (CLEOCIN) 300 MG capsule FOR DENTAL WORK TAKES ALL 1 HOUR BEFORE DENTAL WORK  . clobetasol cream (TEMOVATE) 1.47 % Apply 1 application topically as needed (VULVAR IRRITATION).  . Coenzyme Q10 (CO Q-10) 200 MG CAPS Take 200 mg by mouth daily.  Marland Kitchen desmopressin (DDAVP) 0.2 MG tablet Take 0.6 mg by mouth at bedtime.   Marland Kitchen EPINEPHrine 0.3 mg/0.3 mL IJ SOAJ injection See admin instructions.  . fexofenadine (ALLEGRA) 180 MG tablet Take 180 mg by mouth daily.   . furosemide (LASIX) 20 MG tablet TAKE 1 TABLET BY MOUTH EVERY DAY  . hydrALAZINE (APRESOLINE) 50 MG tablet TAKE 1 TABLET BY MOUTH 3 TIMES A DAY AND TAKE EXTRA TABLET IF SBP>160 Must schedule follow up appt for further refills  . hydrOXYzine (ATARAX/VISTARIL) 10 MG tablet Take 10 mg by mouth at bedtime.   Marland Kitchen losartan (COZAAR) 100 MG tablet TAKE 1 TABLET BY MOUTH EVERY DAY**NEEDS APPT**  . magnesium oxide (MAG-OX) 400 MG tablet Take 400 mg by mouth daily.    .  miconazole (MICOTIN) 2 % powder Apply topically as needed for itching.  . Multiple Vitamins-Minerals (ICAPS AREDS 2 PO) Take by mouth daily.  Marland Kitchen omeprazole (PRILOSEC) 40 MG capsule TAKE 1 CAPSULE BY MOUTH EVERY DAY.**PATIENT NEEDS APPT PER DOCTOR**  . pentosan polysulfate (ELMIRON) 100 MG capsule Take 100 mg by mouth 3 (three) times daily before meals.    . prazosin (MINIPRESS) 5 MG capsule *NEED OFFICE VISIT* TAKE 1 CAPSULE BY MOUTH TWICE A DAY  . Probiotic Product (PROBIOTIC PO) Take 1 capsule by mouth daily.   Marland Kitchen triamcinolone ointment (KENALOG) 0.5 % Apply 1 application topically 2 (two) times daily.  Marland Kitchen UNABLE TO FIND Med Name: JUICE PLUS TAKES 6 CAPS PER DAY  . UNABLE TO FIND Med Name: NOPOLEA 2 OUNCES PER DAY  . UNABLE TO FIND Med Name: JUICE PLUS OMEGA 2 PER DAY  . valACYclovir (VALTREX) 1000 MG tablet as needed.    Review of Systems  Constitutional: Negative for  chills, fatigue and fever.  Respiratory: Positive for shortness of breath and wheezing. Negative for cough and chest tightness.   Cardiovascular: Negative for chest pain and palpitations.  Gastrointestinal: Positive for abdominal pain, constipation and nausea. Negative for blood in stool, diarrhea and vomiting.  Musculoskeletal: Positive for arthralgias and back pain.    Objective:  BP 128/70 (BP Location: Left Arm, Patient Position: Sitting, Cuff Size: Large)   Pulse 70   Temp 97.9 F (36.6 C) (Temporal)   Ht 5\' 2"  (1.575 m)   Wt 182 lb 6.4 oz (82.7 kg)   BMI 33.36 kg/m   Weight: 182 lb 6.4 oz (82.7 kg)   BP Readings from Last 3 Encounters:  05/12/20 128/70  01/20/20 124/84  01/11/20 138/72   Wt Readings from Last 3 Encounters:  05/12/20 182 lb 6.4 oz (82.7 kg)  01/20/20 181 lb (82.1 kg)  01/11/20 182 lb 4.8 oz (82.7 kg)    Physical Exam Constitutional:      General: She is not in acute distress.    Appearance: She is well-developed.  Cardiovascular:     Rate and Rhythm: Normal rate and regular rhythm.      Heart sounds: Murmur heard.  Crescendo decrescendo systolic murmur is present with a grade of 3/6.  No friction rub.  Pulmonary:     Effort: Pulmonary effort is normal. No respiratory distress.     Breath sounds: Decreased breath sounds (diffuse) present. No wheezing or rales.  Abdominal:     General: Abdomen is flat. Bowel sounds are normal.     Palpations: Abdomen is soft.     Tenderness: There is no abdominal tenderness (not reproducible).  Musculoskeletal:     Right lower leg: No edema.     Left lower leg: No edema.  Neurological:     Mental Status: She is alert and oriented to person, place, and time.  Psychiatric:        Behavior: Behavior normal.     Assessment/Plan  1. Osteoporosis, unspecified osteoporosis type, unspecified pathological fracture presence Bone density study has been ordered.  Patient was never contacted previously to complete study.  We will also recheck vitamin levels. - DG Bone Density; Future - VITAMIN D 25 Hydroxy (Vit-D Deficiency, Fractures); Future - VITAMIN D 25 Hydroxy (Vit-D Deficiency, Fractures)  2. Shortness of breath Feels that shortness of breath is increased, might be related to deconditioning.  Continue with current inhalers.  Follow-up pending x-ray report. - DG Chest 2 View; Future  3. Essential hypertension We discussed difficulty with treating systolic pressure and diastolic pressures on the lower end.  Encouraged her to keep checking blood pressures at home.we will follow up with her after blood work is obtained.  Encouraged her to follow-up with cardiology, since she is due for a visit and also having some increased fatigue.  She would love to lose weight and get off of some of medications.   - CBC with Differential/Platelet; Future - Comprehensive metabolic panel; Future - Comprehensive metabolic panel - CBC with Differential/Platelet  4. Venous (peripheral) insufficiency This is been stable.  She follows with  cardiology.  5. Bilateral carotid artery disease, unspecified type ([place]) This is monitored closely by cardiology.  Has been stable.  She has been intolerant to statins so is on the Nexletol.  6. Allergic rhinitis, unspecified seasonality, unspecified trigger Following with allergy specialist.  7. Mild persistent asthma without complication Continue with albuterol as needed.  Continue with Symbicort 2 puffs twice daily.  8.  OSA (obstructive sleep apnea) She is doing great with dental appliance.  This is significantly helped with sleep.  Continue this.  9. Gastroesophageal reflux disease without esophagitis Does well with omeprazole.  10. HYPERCHOLESTEROLEMIA On Nexletol.  Following with cardiology.. - Lipid panel; Future - Lipid panel  11. Macular degeneration of both eyes, unspecified type Follows with ophthalmology regularly.  12. Hyperglycemia Has been well controlled.  She does watch diet. - Hemoglobin A1c; Future - Hemoglobin A1c  13. H/O cold sores Zovirax or Valtrex as needed.  57. History of melanoma Follows with dermatology on a regular basis.  15. Fatigue, unspecified type Follow-up for fatigue pending results. - TSH; Future - TSH  16. B12 deficiency She is taking vitamin B12 supplementation. - Vitamin B12; Future - Vitamin B12  17. INTERSTITIAL CYSTITIS Stable on the Elmiron  18. Right upper quadrant abdominal pain Uncertain etiology for ongoing abdominal discomfort.  We will start with blood work, consider follow-up with GI for further evaluation. - Lipase; Future - Amylase; Future - Amylase - Lipase    Return for pending lab results/xray. Time of visit, charting, chart review over 40 minutes.   Micheline Rough, MD

## 2020-05-13 LAB — HEMOGLOBIN A1C
Hgb A1c MFr Bld: 5.5 % of total Hgb (ref <5.7)
Mean Plasma Glucose: 111 (calc)
eAG (mmol/L): 6.2 (calc)

## 2020-05-13 LAB — COMPREHENSIVE METABOLIC PANEL
AG Ratio: 1.8 (calc) (ref 1.0–2.5)
ALT: 11 U/L (ref 6–29)
AST: 14 U/L (ref 10–35)
Albumin: 4.2 g/dL (ref 3.6–5.1)
Alkaline phosphatase (APISO): 43 U/L (ref 37–153)
BUN: 21 mg/dL (ref 7–25)
CO2: 27 mmol/L (ref 20–32)
Calcium: 9.7 mg/dL (ref 8.6–10.4)
Chloride: 103 mmol/L (ref 98–110)
Creat: 0.88 mg/dL (ref 0.60–0.88)
Globulin: 2.3 g/dL (calc) (ref 1.9–3.7)
Glucose, Bld: 91 mg/dL (ref 65–99)
Potassium: 4.4 mmol/L (ref 3.5–5.3)
Sodium: 136 mmol/L (ref 135–146)
Total Bilirubin: 0.4 mg/dL (ref 0.2–1.2)
Total Protein: 6.5 g/dL (ref 6.1–8.1)

## 2020-05-13 LAB — CBC WITH DIFFERENTIAL/PLATELET
Absolute Monocytes: 610 cells/uL (ref 200–950)
Basophils Absolute: 38 cells/uL (ref 0–200)
Basophils Relative: 0.7 %
Eosinophils Absolute: 70 cells/uL (ref 15–500)
Eosinophils Relative: 1.3 %
HCT: 37 % (ref 35.0–45.0)
Hemoglobin: 12.3 g/dL (ref 11.7–15.5)
Lymphs Abs: 1388 cells/uL (ref 850–3900)
MCH: 29.6 pg (ref 27.0–33.0)
MCHC: 33.2 g/dL (ref 32.0–36.0)
MCV: 89.2 fL (ref 80.0–100.0)
MPV: 9.7 fL (ref 7.5–12.5)
Monocytes Relative: 11.3 %
Neutro Abs: 3294 cells/uL (ref 1500–7800)
Neutrophils Relative %: 61 %
Platelets: 259 10*3/uL (ref 140–400)
RBC: 4.15 10*6/uL (ref 3.80–5.10)
RDW: 12.9 % (ref 11.0–15.0)
Total Lymphocyte: 25.7 %
WBC: 5.4 10*3/uL (ref 3.8–10.8)

## 2020-05-13 LAB — LIPID PANEL
Cholesterol: 191 mg/dL (ref <200)
HDL: 72 mg/dL (ref >=50)
LDL Cholesterol (Calc): 105 mg/dL (calc) — ABNORMAL HIGH
Non-HDL Cholesterol (Calc): 119 mg/dL (calc) (ref <130)
Total CHOL/HDL Ratio: 2.7 (calc) (ref <5.0)
Triglycerides: 47 mg/dL (ref <150)

## 2020-05-13 LAB — TSH: TSH: 1.67 mIU/L (ref 0.40–4.50)

## 2020-05-13 LAB — LIPASE: Lipase: 16 U/L (ref 7–60)

## 2020-05-13 LAB — VITAMIN D 25 HYDROXY (VIT D DEFICIENCY, FRACTURES): Vit D, 25-Hydroxy: 31 ng/mL (ref 30–100)

## 2020-05-13 LAB — VITAMIN B12: Vitamin B-12: >2000 pg/mL — ABNORMAL HIGH (ref 200–1100)

## 2020-05-13 LAB — AMYLASE: Amylase: 23 U/L (ref 21–101)

## 2020-05-15 ENCOUNTER — Telehealth: Payer: Self-pay | Admitting: Genetic Counselor

## 2020-05-15 NOTE — Telephone Encounter (Signed)
Received a genetic counseling referral from Dr. Barry Dienes for fhx of pancreatic cancer. Pt returned my call to schedule an appt w/karen on 8/2 at 1pm. Pt aware to arrive 15 minutes early.

## 2020-05-16 ENCOUNTER — Other Ambulatory Visit: Payer: Self-pay | Admitting: Family Medicine

## 2020-05-16 ENCOUNTER — Other Ambulatory Visit: Payer: Self-pay | Admitting: Cardiovascular Disease

## 2020-05-17 ENCOUNTER — Other Ambulatory Visit: Payer: Self-pay

## 2020-05-17 ENCOUNTER — Ambulatory Visit (INDEPENDENT_AMBULATORY_CARE_PROVIDER_SITE_OTHER)
Admission: RE | Admit: 2020-05-17 | Discharge: 2020-05-17 | Disposition: A | Discharge status: Home / Self Care | Payer: PPO | Source: Ambulatory Visit | Attending: Family Medicine | Admitting: Family Medicine

## 2020-05-17 DIAGNOSIS — R0602 Shortness of breath: Secondary | ICD-10-CM | POA: Diagnosis not present

## 2020-05-17 DIAGNOSIS — J3089 Other allergic rhinitis: Secondary | ICD-10-CM | POA: Diagnosis not present

## 2020-05-17 DIAGNOSIS — M810 Age-related osteoporosis without current pathological fracture: Secondary | ICD-10-CM

## 2020-05-17 DIAGNOSIS — J90 Pleural effusion, not elsewhere classified: Secondary | ICD-10-CM | POA: Diagnosis not present

## 2020-05-17 DIAGNOSIS — J301 Allergic rhinitis due to pollen: Secondary | ICD-10-CM | POA: Diagnosis not present

## 2020-05-19 ENCOUNTER — Telehealth: Payer: Self-pay | Admitting: *Deleted

## 2020-05-19 NOTE — Telephone Encounter (Signed)
Patient called back and I informed her of the chest x-ray results from 7/14.  Unable to enter in the results note.

## 2020-05-22 ENCOUNTER — Encounter: Payer: Self-pay | Admitting: Cardiovascular Disease

## 2020-05-22 ENCOUNTER — Telehealth: Payer: Self-pay | Admitting: Cardiology

## 2020-05-22 NOTE — Telephone Encounter (Signed)
If the OT has availability they will be great.

## 2020-05-22 NOTE — Progress Notes (Signed)
Cardiology Office Note:    Date:  05/23/2020   ID:  Brianna Mccarty, DOB April 21, 1941, MRN [id number]  PCP:  Caren Macadam, MD  [facility] Cardiologist:  Ena Dawley, MD  [place] Electrophysiologist:  None   Referring MD: Caren Macadam, MD   Chief Complaint  Patient presents with  . Hypertension  . Shortness of Breath     History of Present Illness:    Brianna Mccarty is a 79 y.o. female with a hx of HTN. I am seeing her as a work in  visit today for increasing dyspnea   Echocardiogram from August, 2019 reveals normal left ventricular systolic function.  She has grade 1 diastolic dysfunction.  She has mild aortic stenosis.  The left atrium is severely dilated.  Seen for work in visit  Has had low BP with high BP   She brought her blood pressure log with her.  Her blood pressure is typically elevated when she first wakes up in the morning and she gets lots of readings in the 1 5160 range.  Several hours later after taking her medications her blood pressures returned down to the normal range-120s to 130s.  Is concerned about her increasing fatigue and extreme dyspnea.   Walks on the treadmill - 2 miles a day twice a week. More recently she has been walking 2 miles a day every day.. She has no shortness of breath or fatigue when she is walking on the treadmill.  She notices the fatigue later in the afternoon when she is walking across the parking lot or doing other chores.  No recent weight gain  Avoids salt , avoids salty foods.  Eats out twice a week.  Takes lasix 1-2 times a week only   No CP .   Has asthma and wheezes occasionally but this is not the dyspnea she is experiencing  Takes allega and allergy shot weekly  Takes PRN resuce inhaler.  synbicort BID   Has OSA,  Uses her CPAP device     Past Medical History:  Diagnosis Date  . Abnormal EKG    Normal LV function in the past  . Allergic rhinitis   . Asthma   . Bronchitis,  mucopurulent recurrent ([place])   . Carotid artery disease (HCC)    a. mild by carotid duplex.  . Cervical dysplasia 1971  . Diverticulosis of colon   . DJD (degenerative joint disease)   . GERD (gastroesophageal reflux disease)   . Headache(784.0)   . Hypercholesterolemia   . Hypertension   . Interstitial cystitis    sees urologist  . Lichen sclerosus   . Malignant melanoma ([place]) 2006   sees Dr. [place] Crane in dermatology  . Migraines   . Mild aortic stenosis   . Mitral valve disease    Question mitral valve prolapse in the past, no prolapse by echo 2009  . Murmur 10/20/2015   SEES DR Meda Coffee  . OSA (obstructive sleep apnea)    USES DENTAL DEVICE  . Osteoporosis    on fosomax > 5 years, stopped 11/2015  . Renal calculus    sees urologist  . Thyroid cyst    1 x 1.1 thyroid cyst noted on carotid Doppler, January, 2012  . Venous insufficiency     Past Surgical History:  Procedure Laterality Date  . BREAST BIOPSY Left 11/25/2011   U/S core, benign performed at [facility], LEFT BREAT MARKER IN  . BREAST CYST ASPIRATION    . BREAST SURGERY  2013   Breast Bx-Benign  . CARDIAC CATHETERIZATION    . CATARACT EXTRACTION, BILATERAL    . CHOLECYSTECTOMY N/A 12/22/2019   Procedure: LAPAROSCOPIC CHOLECYSTECTOMY;  Surgeon: Ileana Roup, MD;  Location: [facility];  Service: General;  Laterality: N/A;  . cystoscopy and basket stone removal right ureter  02/2006   Dr. Terance Hart  . GALLBLADDER SURGERY  12/22/2019  . GYNECOLOGIC CRYOSURGERY  1971  . left total hip replacement  2004   Dr. Gladstone Lighter  . melanoma removed from medial rleft knee area  2006   Dr. [place] Crane  . NASAL SEPTUM SURGERY      Current Medications: Current Meds  Medication Sig  . albuterol (PROAIR HFA) 108 (90 Base) MCG/ACT inhaler Inhale 2 puffs into the lungs every 6 (six) hours as needed for wheezing.  Marland Kitchen albuterol (PROVENTIL) (2.5 MG/3ML) 0.083% nebulizer solution Take 3 mLs (2.5 mg total) by nebulization every  6 (six) hours as needed.  Marland Kitchen amLODipine (NORVASC) 10 MG tablet TAKE 1 TABLET BY MOUTH EVERY DAY  . Ascorbic Acid (VITAMIN C) 500 MG tablet Take 500 mg by mouth daily.    Marland Kitchen aspirin 81 MG tablet Take 81 mg by mouth daily.    Marland Kitchen aspirin-acetaminophen-caffeine (EXCEDRIN MIGRAINE) 250-250-65 MG tablet Take by mouth every 6 (six) hours as needed for headache.  Marland Kitchen azelastine (ASTELIN) 0.1 % nasal spray Instill 2 sprays into each nostril two times a day as needed for allergies  . Bempedoic Acid (NEXLETOL) 180 MG TABS Take 1 tablet by mouth daily.  . budesonide-formoterol (SYMBICORT) 160-4.5 MCG/ACT inhaler Inhale 2 puffs 2 (two) times daily into the lungs.  . Calcium Carbonate (CALCIUM 600) 1500 MG TABS Take 1 tablet by mouth daily.    . Cholecalciferol (VITAMIN D3) 5000 UNITS CAPS Take 5,000 Units by mouth daily.   . Cinnamon 500 MG TABS Take 1 tablet by mouth daily.   . clindamycin (CLEOCIN) 300 MG capsule FOR DENTAL WORK TAKES ALL 1 HOUR BEFORE DENTAL WORK  . clobetasol cream (TEMOVATE) 8.54 % Apply 1 application topically as needed (VULVAR IRRITATION).  . Coenzyme Q10 (CO Q-10) 200 MG CAPS Take 200 mg by mouth daily.  Marland Kitchen desmopressin (DDAVP) 0.2 MG tablet Take 0.6 mg by mouth at bedtime.   Marland Kitchen EPINEPHrine 0.3 mg/0.3 mL IJ SOAJ injection See admin instructions.  . fexofenadine (ALLEGRA) 180 MG tablet Take 180 mg by mouth daily.   . furosemide (LASIX) 20 MG tablet TAKE 1 TABLET BY MOUTH EVERY DAY  . hydrALAZINE (APRESOLINE) 50 MG tablet TAKE 1 TABLET BY MOUTH 3 TIMES A DAY AND TAKE EXTRA TABLET IF SBP>160   Pt must schedule appt for further refills  . hydrOXYzine (ATARAX/VISTARIL) 10 MG tablet Take 10 mg by mouth at bedtime.   Marland Kitchen losartan (COZAAR) 100 MG tablet Take 1 tablet (100 mg total) by mouth daily.  . magnesium oxide (MAG-OX) 400 MG tablet Take 400 mg by mouth daily.    . miconazole (MICOTIN) 2 % powder Apply topically as needed for itching.  . montelukast (SINGULAIR) 10 MG tablet Take 1 tablet by  mouth daily in the afternoon.  . Multiple Vitamins-Minerals (ICAPS AREDS 2 PO) Take by mouth daily.  Marland Kitchen omeprazole (PRILOSEC) 40 MG capsule TAKE 1 CAPSULE BY MOUTH EVERY DAY.**PATIENT NEEDS APPT PER DOCTOR**  . pentosan polysulfate (ELMIRON) 100 MG capsule Take 100 mg by mouth 3 (three) times daily before meals.    . prazosin (MINIPRESS) 5 MG capsule *NEED OFFICE VISIT* TAKE 1  CAPSULE BY MOUTH TWICE A DAY  . Probiotic Product (PROBIOTIC PO) Take 1 capsule by mouth daily.   Marland Kitchen triamcinolone ointment (KENALOG) 0.5 % Apply 1 application topically 2 (two) times daily.  Marland Kitchen UNABLE TO FIND Med Name: JUICE PLUS TAKES 6 CAPS PER DAY  . UNABLE TO FIND Med Name: NOPOLEA 2 OUNCES PER DAY  . UNABLE TO FIND Med Name: JUICE PLUS OMEGA 2 PER DAY  . valACYclovir (VALTREX) 1000 MG tablet as needed.     Allergies:   Cephalexin, Hydrochlorothiazide, Montelukast sodium, Penicillins, Phenobarbital, Rosuvastatin, and Tape   Social History   Socioeconomic History  . Marital status: Married    Spouse name: Edison Nasuti  . Number of children: 2  . Years of education: Not on file  . Highest education level: Not on file  Occupational History  . Occupation: retired    Fish farm manager: RETIRED  Tobacco Use  . Smoking status: Never Smoker  . Smokeless tobacco: Never Used  Vaping Use  . Vaping Use: Never used  Substance and Sexual Activity  . Alcohol use: Yes    Alcohol/week: 1.0 standard drink    Types: 1 Standard drinks or equivalent per week    Comment:  OCC WINE  . Drug use: No  . Sexual activity: Not Currently    Birth control/protection: Post-menopausal    Comment: 1st intercourse 20 yo-1 partner  Other Topics Concern  . Not on file  Social History Narrative   Updated 12/13/2019   Work or School: none      Home Situation: lives with husband      Spiritual Beliefs: christian      Lifestyle: regular exercise; healthy diet      12/13/2019: Uses treadmill 3/x weekly.    Looks after sister who has been having  memory decline, as well  as husband with minor memory decline.    Enjoys writing poetry, reading, going to church, travel   Social Determinants of Health   Financial Resource Strain: Low Risk   . Difficulty of Paying Living Expenses: Not hard at all  Food Insecurity: No Food Insecurity  . Worried About Charity fundraiser in the Last Year: Never true  . Ran Out of Food in the Last Year: Never true  Transportation Needs: No Transportation Needs  . Lack of Transportation (Medical): No  . Lack of Transportation (Non-Medical): No  Physical Activity: Sufficiently Active  . Days of Exercise per Week: 3 days  . Minutes of Exercise per Session: 50 min  Stress: No Stress Concern Present  . Feeling of Stress : Only a little  Social Connections: Socially Integrated  . Frequency of Communication with Friends and Family: More than three times a week  . Frequency of Social Gatherings with Friends and Family: Once a week  . Attends Religious Services: More than 4 times per year  . Active Member of Clubs or Organizations: Yes  . Attends Archivist Meetings: More than 4 times per year  . Marital Status: Married     Family History: The patient's family history includes Cancer in her brother and father; Colon cancer in her paternal uncle; Diabetes in her brother, brother, and mother; Heart disease in her brother and mother; Hypertension in her brother, brother, sister, and sister.  ROS:   Please see the history of present illness.     All other systems reviewed and are negative.  EKGs/Labs/Other Studies Reviewed:    The following studies were reviewed today:   EKG:  May 23, 2020: Sinus bradycardia 58 beats minute.  No ST or T wave changes.  Recent Labs: 05/12/2020: ALT 11; BUN 21; Creat 0.88; Hemoglobin 12.3; Platelets 259; Potassium 4.4; Sodium 136; TSH 1.67  Recent Lipid Panel    Component Value Date/Time   CHOL 191 05/12/2020 1448   CHOL 226 (H) 05/31/2019 0952   TRIG 47  05/12/2020 1448   HDL 72 05/12/2020 1448   HDL 74 05/31/2019 0952   CHOLHDL 2.7 05/12/2020 1448   VLDL 7.4 08/10/2018 0949   LDLCALC 105 (H) 05/12/2020 1448   LDLDIRECT 126.6 07/18/2010 1045    Physical Exam:    VS:  BP 138/68   Pulse (!) 58   Ht 5\' 2"  (1.575 m)   Wt 184 lb 6.4 oz (83.6 kg)   SpO2 96%   BMI 33.73 kg/m     Wt Readings from Last 3 Encounters:  05/23/20 184 lb 6.4 oz (83.6 kg)  05/12/20 182 lb 6.4 oz (82.7 kg)  01/20/20 181 lb (82.1 kg)     GEN:  Well nourished, well developed in no acute distress HEENT: Normal NECK: No JVD; No carotid bruits LYMPHATICS: No lymphadenopathy CARDIAC: RRR, no murmurs, rubs, gallops RESPIRATORY:  Clear to auscultation without rales, wheezing or rhonchi  ABDOMEN: Soft, non-tender, non-distended MUSCULOSKELETAL:  No edema; No deformity  SKIN: Warm and dry NEUROLOGIC:  Alert and oriented x 3 PSYCHIATRIC:  Normal affect   ASSESSMENT:    1. Mild aortic stenosis    PLAN:    In order of problems listed above:  1. Hypertension: Ms. Ayesha Rumpf presents with some intermittent hypertension.  She is noticed that her blood pressure is elevated when she wakes up first thing in the morning.  Her blood pressure is typically well controlled several hours after she takes her medications.  Of advised her not to check her blood pressure when she first wakes up in the morning but only record her blood pressure readings several hours after she takes her medication.  She was also relieved to find out that having a slow heart rate of 58 is not harmful and in fact is not an emergency at all.  I told her to give [place] a call if she develops symptoms or if her heart rate was persistently in the 40s.  She is not had any episodes of syncope or presyncope.  She eats out twice a week.  I suspect that she is getting a bit of extra salt. I encouraged her to continue to workout and to avoid eating salt.  I have advised her to restrict her intake of carbohydrates-she  is to avoid things that are white, wheat or sweet.  Ideally we would have her on HCTZ but she is intolerant to HCTZ.  It caused severe hyponatremia which prompted a hospitalization on her in the past. We will continue the as needed Lasix for now.  She is having any signs or symptoms of ischemia.  She had a normal stress test several years ago and an unremarkable echo 2 years ago.  I reassured her that there does not seem to be anything serious going on now.  She will continue to work on some lifestyle modifications and will see Dr. Meda Coffee in 2 to 3 months.   Medication Adjustments/Labs and Tests Ordered: Current medicines are reviewed at length with the patient today.  Concerns regarding medicines are outlined above.  Orders Placed This Encounter  Procedures  . EKG 12-Lead   No orders of the defined  types were placed in this encounter.   Patient Instructions  Medication Instructions:  Your physician recommends that you continue on your current medications as directed. Please refer to the Current Medication list given to you today.  *If you need a refill on your cardiac medications before your next appointment, please call your pharmacy*   Lab Work: none If you have labs (blood work) drawn today and your tests are completely normal, you will receive your results only by: Marland Kitchen MyChart Message (if you have MyChart) OR . A paper copy in the mail If you have any lab test that is abnormal or we need to change your treatment, we will call you to review the results.   Testing/Procedures: none   Follow-Up: At [facility], you and your health needs are our priority.  As part of our continuing mission to provide you with exceptional heart care, we have created designated Provider Care Teams.  These Care Teams include your primary Cardiologist (physician) and Advanced Practice Providers (APPs -  Physician Assistants and Nurse Practitioners) who all work together to provide you with the  care you need, when you need it.  We recommend signing up for the patient portal called "MyChart".  Sign up information is provided on this After Visit Summary.  MyChart is used to connect with patients for Virtual Visits (Telemedicine).  Patients are able to view lab/test results, encounter notes, upcoming appointments, etc.  Non-urgent messages can be sent to your provider as well.   To learn more about what you can do with MyChart, go to [email or web address].    Your next appointment:   3 month(s)  The format for your next appointment:   In Person  Provider:   Ena Dawley, MD   Other Instructions      Signed, Mertie Moores, MD  05/23/2020 5:32 PM    [place]

## 2020-05-22 NOTE — Telephone Encounter (Signed)
Will route call to primary card nurse [name].

## 2020-05-22 NOTE — Telephone Encounter (Signed)
Pt is calling in to inform Dr. Meda Coffee that over the last 2 weeks, she has been experiencing high BP readings, and her HR's have been running lower, in the high 50s.  Pt states she is feeling more fatigued since then, and gets mild sob on exertion. She denies any chest pain, palpitations, dizziness, weakness, headaches, N/V, lower extremity edema, blurred vision, pre-syncopal or syncopal episodes.  She states she has been exercising for about a month now, by walking on the treadmill. Pt states she is very mindful of her diet and sodium intake. Pt states she is taking all her cardiac meds as prescribed, and confirmed as listed on her med list.  She said her BP reading this morning was 156/69 and HR's have been in the high 50s. She also mentioned readings in this message. She is scheduled to see Dr. Meda Coffee for routine follow-up on 9/21. She states she saw her PCP for routine wellness and for this issue about a week ago, and her PCP did not feel comfortable changing her cardiac regimen around.  Labs were also drawn and available for review in Epic.  Everything was WNL's, all but Vit D and B12, which PCP advised on. Informed the pt that I will send this message to Dr. Meda Coffee to further review and advise on. Informed the pt that once further recommendations are received, I will follow-up with her thereafter. Pt verbalized understanding and agrees with this plan.

## 2020-05-22 NOTE — Telephone Encounter (Signed)
She is not on any AV nodal blocking agent, please bring her to the clinic for appointment with a PA as soon as possible.  Thank you

## 2020-05-22 NOTE — Telephone Encounter (Signed)
Dr. Meda Coffee, there are no availabilities for APP in our office the month of July.  Please advise, or should she see DOD.

## 2020-05-22 NOTE — Telephone Encounter (Signed)
Patient returning call. She states her cell phone did not ring last time and to call her home number back.

## 2020-05-22 NOTE — Telephone Encounter (Signed)
Pt c/o BP issue: STAT if pt c/o blurred vision, one-sided weakness or slurred speech  1. What are your last 5 BP readings?   155/67 143/64 147/64 141/64 140/64  2. Are you having any other symptoms (ex. Dizziness, headache, blurred vision, passed out)? SOB  3. What is your BP issue? BP has been low.   Pt c/o Shortness Of Breath: STAT if SOB developed within the last 24 hours or pt is noticeably SOB on the phone  1. Are you currently SOB (can you hear that pt is SOB on the phone)? No  2. How long have you been experiencing SOB? Since 05/07/20  3. Are you SOB when sitting or when up moving around? When up and moving around  4. Are you currently experiencing any other symptoms? Fatigue

## 2020-05-22 NOTE — Telephone Encounter (Signed)
Left the pt a message to call the office back. 

## 2020-05-22 NOTE — Telephone Encounter (Signed)
Spoke with the pt and informed her that per Dr. Meda Coffee, she wants her to come in and be seen asap, for complaints mentioned. Informed the pt that per Dr. Meda Coffee, being she is not on any AV nodal blocking agent, she needs to be seen first,  before further medication changes can be safely made.    Scheduled the pt to come in and see the DOD Dr. Acie Fredrickson for tomorrow 7/20 at 3:20 pm.  Advised her to arrive 15 mins prior to this appt.  Pt verbalized understanding and agrees with this plan.

## 2020-05-23 ENCOUNTER — Ambulatory Visit: Payer: PPO | Admitting: Cardiovascular Disease

## 2020-05-23 ENCOUNTER — Encounter: Payer: Self-pay | Admitting: Cardiovascular Disease

## 2020-05-23 ENCOUNTER — Other Ambulatory Visit: Payer: Self-pay

## 2020-05-23 VITALS — BP 138/68 | HR 58 | Ht 62.0 in | Wt 184.4 lb

## 2020-05-23 DIAGNOSIS — I350 Nonrheumatic aortic (valve) stenosis: Secondary | ICD-10-CM | POA: Diagnosis not present

## 2020-05-23 DIAGNOSIS — I10 Essential (primary) hypertension: Secondary | ICD-10-CM | POA: Diagnosis not present

## 2020-05-23 NOTE — Patient Instructions (Signed)
Medication Instructions:  Your physician recommends that you continue on your current medications as directed. Please refer to the Current Medication list given to you today.  *If you need a refill on your cardiac medications before your next appointment, please call your pharmacy*   Lab Work: none If you have labs (blood work) drawn today and your tests are completely normal, you will receive your results only by: Marland Kitchen MyChart Message (if you have MyChart) OR . A paper copy in the mail If you have any lab test that is abnormal or we need to change your treatment, we will call you to review the results.   Testing/Procedures: none   Follow-Up: At [facility], you and your health needs are our priority.  As part of our continuing mission to provide you with exceptional heart care, we have created designated Provider Care Teams.  These Care Teams include your primary Cardiologist (physician) and Advanced Practice Providers (APPs -  Physician Assistants and Nurse Practitioners) who all work together to provide you with the care you need, when you need it.  We recommend signing up for the patient portal called "MyChart".  Sign up information is provided on this After Visit Summary.  MyChart is used to connect with patients for Virtual Visits (Telemedicine).  Patients are able to view lab/test results, encounter notes, upcoming appointments, etc.  Non-urgent messages can be sent to your provider as well.   To learn more about what you can do with MyChart, go to [email or web address].    Your next appointment:   3 month(s)  The format for your next appointment:   In Person  Provider:   Ena Dawley, MD   Other Instructions

## 2020-05-25 ENCOUNTER — Other Ambulatory Visit: Payer: Self-pay | Admitting: *Deleted

## 2020-05-25 ENCOUNTER — Telehealth: Payer: Self-pay | Admitting: Family Medicine

## 2020-05-25 DIAGNOSIS — J3089 Other allergic rhinitis: Secondary | ICD-10-CM | POA: Diagnosis not present

## 2020-05-25 DIAGNOSIS — J301 Allergic rhinitis due to pollen: Secondary | ICD-10-CM | POA: Diagnosis not present

## 2020-05-25 MED ORDER — ALENDRONATE SODIUM 70 MG PO TABS
70.0000 mg | ORAL_TABLET | ORAL | 11 refills | Status: DC
Start: 2020-05-25 — End: 2021-05-06

## 2020-05-25 NOTE — Telephone Encounter (Signed)
Pt returned the call to the office. 

## 2020-05-25 NOTE — Addendum Note (Signed)
Addended by: Georgiann Cocker on: 05/25/2020 01:23 PM   Modules accepted: Orders

## 2020-05-25 NOTE — Telephone Encounter (Signed)
See results note. 

## 2020-05-25 NOTE — Addendum Note (Signed)
Addended by: Georgiann Cocker on: 05/25/2020 01:22 PM   Modules accepted: Orders

## 2020-05-29 DIAGNOSIS — J301 Allergic rhinitis due to pollen: Secondary | ICD-10-CM | POA: Diagnosis not present

## 2020-05-29 DIAGNOSIS — J3089 Other allergic rhinitis: Secondary | ICD-10-CM | POA: Diagnosis not present

## 2020-05-31 ENCOUNTER — Other Ambulatory Visit: Payer: Self-pay | Admitting: Family Medicine

## 2020-06-05 ENCOUNTER — Inpatient Hospital Stay: Payer: PPO | Admitting: Genetic Counselor

## 2020-06-05 ENCOUNTER — Inpatient Hospital Stay: Payer: PPO

## 2020-06-07 ENCOUNTER — Other Ambulatory Visit: Payer: Self-pay | Admitting: Cardiology

## 2020-06-07 DIAGNOSIS — J3089 Other allergic rhinitis: Secondary | ICD-10-CM | POA: Diagnosis not present

## 2020-06-07 DIAGNOSIS — J301 Allergic rhinitis due to pollen: Secondary | ICD-10-CM | POA: Diagnosis not present

## 2020-06-08 ENCOUNTER — Other Ambulatory Visit: Payer: Self-pay | Admitting: Cardiovascular Disease

## 2020-06-08 MED ORDER — NEXLETOL 180 MG PO TABS: 1.0000 | ORAL_TABLET | Freq: Every day | ORAL | 11 refills | Status: DC

## 2020-06-08 NOTE — Telephone Encounter (Signed)
Pt's pharmacy is requesting a refill on bempedoic acid (Nexletol). Would Dr. Meda Coffee like to refill this medication? Please address

## 2020-06-09 MED ORDER — HYDRALAZINE HCL 50 MG PO TABS: ORAL_TABLET | ORAL | 3 refills | Status: DC

## 2020-06-12 DIAGNOSIS — J3089 Other allergic rhinitis: Secondary | ICD-10-CM | POA: Diagnosis not present

## 2020-06-12 DIAGNOSIS — J301 Allergic rhinitis due to pollen: Secondary | ICD-10-CM | POA: Diagnosis not present

## 2020-06-19 DIAGNOSIS — J3089 Other allergic rhinitis: Secondary | ICD-10-CM | POA: Diagnosis not present

## 2020-06-19 DIAGNOSIS — J301 Allergic rhinitis due to pollen: Secondary | ICD-10-CM | POA: Diagnosis not present

## 2020-06-26 DIAGNOSIS — N3010 Interstitial cystitis (chronic) without hematuria: Secondary | ICD-10-CM | POA: Diagnosis not present

## 2020-06-27 DIAGNOSIS — J3089 Other allergic rhinitis: Secondary | ICD-10-CM | POA: Diagnosis not present

## 2020-06-27 DIAGNOSIS — J301 Allergic rhinitis due to pollen: Secondary | ICD-10-CM | POA: Diagnosis not present

## 2020-06-28 ENCOUNTER — Telehealth: Payer: Self-pay | Admitting: Family Medicine

## 2020-06-28 NOTE — Telephone Encounter (Signed)
Patient stated she has not started taking Fosamax due to reading side effects about this.  Patient stated she is concerned with this causing broken thigh bones, muscle problems and GI problems and wanted to know if there was a different medication that could be sent in?.  Message sent to PCP.

## 2020-06-28 NOTE — Telephone Encounter (Signed)
Pt call and stated that dr. Ethlyn Gallery  prescribed something  For her bone and she have not taken it. she stated if she could call her something else in because of the side effect and would like a call back.

## 2020-06-28 NOTE — Telephone Encounter (Signed)
Assume she is talking about fosamax? Please get more info - did she try it? Or just worried about side effects.

## 2020-06-29 NOTE — Telephone Encounter (Signed)
*  there really is not much of an alternative to fosamax given her concerns/bone density. The risk of the femur fracture is more of an issue for patients with high doses and on the medication for longer periods of time (which is why we limit treatment).   *I am worried about the amount of decrease she had in the hip from 2018 to the present study and worry if she fell that she would fracture.   *BUT given her concerns with medication; we can maximize weight bearing exercise, vitamin D and plan to recheck in 2 years off of medication? I know there are concerns with the fosamax, but the other bone density medications also come with risks, so there is really not a "better" option. [email or web address] has a good explanation and happy to talk through this more with her if needed.

## 2020-06-29 NOTE — Telephone Encounter (Signed)
Patient informed of the message below and agreed to repeat testing off of the medication in 2 years.

## 2020-06-30 DIAGNOSIS — H6123 Impacted cerumen, bilateral: Secondary | ICD-10-CM | POA: Diagnosis not present

## 2020-07-03 ENCOUNTER — Other Ambulatory Visit: Payer: Self-pay | Admitting: Family Medicine

## 2020-07-05 ENCOUNTER — Encounter: Payer: Self-pay | Admitting: Family Medicine

## 2020-07-05 ENCOUNTER — Telehealth (INDEPENDENT_AMBULATORY_CARE_PROVIDER_SITE_OTHER): Payer: PPO | Admitting: Family Medicine

## 2020-07-05 VITALS — BP 133/64 | HR 75 | Temp 100.2°F | Ht 62.0 in | Wt 184.0 lb

## 2020-07-05 DIAGNOSIS — R509 Fever, unspecified: Secondary | ICD-10-CM | POA: Diagnosis not present

## 2020-07-05 DIAGNOSIS — I10 Essential (primary) hypertension: Secondary | ICD-10-CM

## 2020-07-05 DIAGNOSIS — R05 Cough: Secondary | ICD-10-CM

## 2020-07-05 DIAGNOSIS — J4530 Mild persistent asthma, uncomplicated: Secondary | ICD-10-CM | POA: Diagnosis not present

## 2020-07-05 DIAGNOSIS — R059 Cough, unspecified: Secondary | ICD-10-CM

## 2020-07-05 MED ORDER — PREDNISONE 20 MG PO TABS
ORAL_TABLET | ORAL | 0 refills | Status: DC
Start: 2020-07-05 — End: 2020-07-12

## 2020-07-05 MED ORDER — DOXYCYCLINE HYCLATE 100 MG PO CAPS: 100.0000 mg | ORAL_CAPSULE | Freq: Two times a day (BID) | ORAL | 0 refills | Status: DC

## 2020-07-05 NOTE — Progress Notes (Signed)
Patient ID: Brianna Mccarty, female   DOB: 01-Oct-1941, 79 y.o.   MRN: [id number]  This visit type was conducted due to national recommendations for restrictions regarding the COVID-19 pandemic in an effort to limit this patient's exposure and mitigate transmission in our community.   Virtual Visit via Telephone Note  I connected with Brianna Mccarty on 07/05/20 at  1:45 PM EDT by telephone and verified that I am speaking with the correct person using two identifiers.   I discussed the limitations, risks, security and privacy concerns of performing an evaluation and management service by telephone and the availability of in person appointments. I also discussed with the patient that there may be a patient responsible charge related to this service. The patient expressed understanding and agreed to proceed.  Location patient: home Location provider: work or home office Participants present for the call: patient, provider Patient did not have a visit in the prior 7 days to address this/these issue(s).   History of Present Illness: Brianna Mccarty has history of asthma, hypertension, obstructive sleep apnea.  She called with onset Monday of some nasal congestion.  She thought this was initially some allergies.  She then developed last night fever 100.4 and today this was up to 101.4.  She states she has been fairly isolated and no sick contacts.  She has had previous Covid vaccine with [employer].  Husband is asymptomatic.  O2 sats ranging 92 to 96% and she thinks this is near her baseline.  She denies any nausea, vomiting, or diarrhea.  She uses Symbicort at baseline and rescue inhaler with Advair.  No distress at rest.  Some increased fatigue.  Past Medical History:  Diagnosis Date  . Abnormal EKG    Normal LV function in the past  . Allergic rhinitis   . Asthma   . Bronchitis, mucopurulent recurrent ([place])   . Carotid artery disease ([facility])    a. mild by carotid duplex.  . Cervical dysplasia 1971  .  Diverticulosis of colon   . DJD (degenerative joint disease)   . GERD (gastroesophageal reflux disease)   . Headache(784.0)   . Hypercholesterolemia   . Hypertension   . Interstitial cystitis    sees urologist  . Lichen sclerosus   . Malignant melanoma ([place]) 2006   sees Dr. Nevada Crane in dermatology  . Migraines   . Mild aortic stenosis   . Mitral valve disease    Question mitral valve prolapse in the past, no prolapse by echo 2009  . Murmur 10/20/2015   SEES DR Meda Coffee  . OSA (obstructive sleep apnea)    USES DENTAL DEVICE  . Osteoporosis    on fosomax > 5 years, stopped 11/2015  . Renal calculus    sees urologist  . Thyroid cyst    1 x 1.1 thyroid cyst noted on carotid Doppler, January, 2012  . Venous insufficiency    Past Surgical History:  Procedure Laterality Date  . BREAST BIOPSY Left 11/25/2011   U/S core, benign performed at [facility], LEFT BREAT MARKER IN  . BREAST CYST ASPIRATION    . BREAST SURGERY  2013   Breast Bx-Benign  . CARDIAC CATHETERIZATION    . CATARACT EXTRACTION, BILATERAL    . CHOLECYSTECTOMY N/A 12/22/2019   Procedure: LAPAROSCOPIC CHOLECYSTECTOMY;  Surgeon: Ileana Roup, MD;  Location: [facility];  Service: General;  Laterality: N/A;  . cystoscopy and basket stone removal right ureter  02/2006   Dr. Terance Hart  . GALLBLADDER SURGERY  12/22/2019  .  GYNECOLOGIC CRYOSURGERY  1971  . left total hip replacement  2004   Dr. Gladstone Lighter  . melanoma removed from medial rleft knee area  2006   Dr. Nevada Crane  . NASAL SEPTUM SURGERY      reports that she has never smoked. She has never used smokeless tobacco. She reports current alcohol use of about 1.0 standard drink of alcohol per week. She reports that she does not use drugs. family history includes Cancer in her brother and father; Colon cancer in her paternal uncle; Diabetes in her brother, brother, and mother; Heart disease in her brother and mother; Hypertension in her brother, brother, sister,  and sister. Allergies  Allergen Reactions  . Cephalexin     Reaction was a high fever  . Hydrochlorothiazide     hyponatremia  . Montelukast Sodium Other (See Comments)    hyponatremia  . Penicillins Hives and Swelling    Swelling of arms & face Has patient had a PCN reaction causing immediate rash, facial/tongue/throat swelling, SOB or lightheadedness with hypotension: Yes Has patient had a PCN reaction causing severe rash involving mucus membranes or skin necrosis: No Has patient had a PCN reaction that required hospitalization: No Has patient had a PCN reaction occurring within the last 10 years: No If all of the above answers are "NO", then may proceed with Cephalosporin use.   Marland Kitchen Phenobarbital Hives  . Rosuvastatin Other (See Comments)    Reports causes lower extremity muscle aches  . Tape Rash    MEDICAL TAPE      Observations/Objective: Patient sounds cheerful and well on the phone. I do not appreciate any SOB. Speech and thought processing are grossly intact. Patient reported vitals:  Assessment and Plan:  2-day history of cough with associated fever.  Patient no respiratory distress.  Underlying comorbidity of asthma which is been generally well controlled on Symbicort.  She has increased risk because of her age and asthma history  -We recommended that she go ahead and get Covid testing which she plans to try to do this afternoon -Stay quarantined until further clarified -Continue regular pulse oximetry monitoring and dipping down below 90 recommend ER evaluation -Continue with her usual inhalers Symbicort and rescue inhaler with albuterol -We agreed to start prednisone 20 mg 2 tablets daily for 6 days and doxycycline 100 g twice daily pending further evaluation  Follow Up Instructions:  -As above   99441 5-10 99442 11-20 99443 21-30 I did not refer this patient for an OV in the next 24 hours for this/these issue(s).  I discussed the assessment and treatment  plan with the patient. The patient was provided an opportunity to ask questions and all were answered. The patient agreed with the plan and demonstrated an understanding of the instructions.   The patient was advised to call back or seek an in-person evaluation if the symptoms worsen or if the condition fails to improve as anticipated.  I provided 23 minutes of non-face-to-face time during this encounter.   Carolann Littler, MD

## 2020-07-06 ENCOUNTER — Inpatient Hospital Stay: Payer: PPO | Admitting: Genetic Counselor

## 2020-07-06 ENCOUNTER — Inpatient Hospital Stay: Payer: PPO

## 2020-07-10 ENCOUNTER — Telehealth: Payer: Self-pay | Admitting: Internal Medicine

## 2020-07-10 ENCOUNTER — Other Ambulatory Visit: Payer: Self-pay | Admitting: Physician Assistant

## 2020-07-10 ENCOUNTER — Ambulatory Visit (HOSPITAL_COMMUNITY)
Admission: RE | Admit: 2020-07-10 | Discharge: 2020-07-10 | Disposition: A | Discharge status: Home / Self Care | Payer: Medicare Other | Source: Ambulatory Visit | Attending: Pulmonary Disease | Admitting: Pulmonary Disease

## 2020-07-10 DIAGNOSIS — U071 COVID-19: Secondary | ICD-10-CM

## 2020-07-10 DIAGNOSIS — Z23 Encounter for immunization: Secondary | ICD-10-CM | POA: Diagnosis not present

## 2020-07-10 DIAGNOSIS — I10 Essential (primary) hypertension: Secondary | ICD-10-CM

## 2020-07-10 DIAGNOSIS — I779 Disorder of arteries and arterioles, unspecified: Secondary | ICD-10-CM

## 2020-07-10 MED ORDER — DIPHENHYDRAMINE HCL 50 MG/ML IJ SOLN: 50.0000 mg | Freq: Once | INTRAMUSCULAR | Status: DC | PRN

## 2020-07-10 MED ORDER — SODIUM CHLORIDE 0.9 % IV SOLN: INTRAVENOUS | Status: DC | PRN

## 2020-07-10 MED ORDER — ALBUTEROL SULFATE HFA 108 (90 BASE) MCG/ACT IN AERS: 2.0000 | INHALATION_SPRAY | Freq: Once | RESPIRATORY_TRACT | Status: DC | PRN

## 2020-07-10 MED ORDER — METHYLPREDNISOLONE SODIUM SUCC 125 MG IJ SOLR: 125.0000 mg | Freq: Once | INTRAMUSCULAR | Status: DC | PRN

## 2020-07-10 MED ORDER — EPINEPHRINE 0.3 MG/0.3ML IJ SOAJ: 0.3000 mg | Freq: Once | INTRAMUSCULAR | Status: DC | PRN

## 2020-07-10 MED ORDER — FAMOTIDINE IN NACL 20-0.9 MG/50ML-% IV SOLN: 20.0000 mg | Freq: Once | INTRAVENOUS | Status: DC | PRN

## 2020-07-10 MED ORDER — SODIUM CHLORIDE 0.9 % IV SOLN
1200.0000 mg | Freq: Once | INTRAVENOUS | Status: AC
  Administered 2020-07-10: 1200 mg via INTRAVENOUS
  Filled 2020-07-10: qty 10

## 2020-07-10 NOTE — Discharge Instructions (Signed)

## 2020-07-10 NOTE — Progress Notes (Signed)
I connected by phone with Brianna Mccarty on 07/10/2020 at 1:30 PM to discuss the potential use of a new treatment for mild to moderate COVID-19 viral infection in non-hospitalized patients.  This patient is a 79 y.o. female that meets the FDA criteria for Emergency Use Authorization of COVID monoclonal antibody casirivimab/imdevimab.  Has a (+) direct SARS-CoV-2 viral test result  Has mild or moderate COVID-19   Is NOT hospitalized due to COVID-19  Is within 10 days of symptom onset  Has at least one of the high risk factor(s) for progression to severe COVID-19 and/or hospitalization as defined in EUA.  Specific high risk criteria : Older age (>/= 80 yo) and BMI > 25, HTN, CVD, lung dz   I have spoken and communicated the following to the patient or parent/caregiver regarding COVID monoclonal antibody treatment:  1. FDA has authorized the emergency use for the treatment of mild to moderate COVID-19 in adults and pediatric patients with positive results of direct SARS-CoV-2 viral testing who are 21 years of age and older weighing at least 40 kg, and who are at high risk for progressing to severe COVID-19 and/or hospitalization.  2. The significant known and potential risks and benefits of COVID monoclonal antibody, and the extent to which such potential risks and benefits are unknown.  3. Information on available alternative treatments and the risks and benefits of those alternatives, including clinical trials.  4. Patients treated with COVID monoclonal antibody should continue to self-isolate and use infection control measures (e.g., wear mask, isolate, social distance, avoid sharing personal items, clean and disinfect "high touch" surfaces, and frequent handwashing) according to CDC guidelines.   5. The patient or parent/caregiver has the option to accept or refuse COVID monoclonal antibody treatment.  After reviewing this information with the patient, The patient agreed to proceed with  receiving casirivimab\imdevimab infusion and will be provided a copy of the Fact sheet prior to receiving the infusion.  Sx onset 8/30. Set up for infusion on 9/6 @ 4:30pm. Directions given to [facility]. Pt is aware that insurance will be charged an infusion fee. Pt is fully vaccinated with [employer].   [name] 07/10/2020 1:30 PM

## 2020-07-10 NOTE — Progress Notes (Signed)
  Diagnosis: COVID-19  Physician: Joya Gaskins  Procedure: Covid Infusion Clinic Med: casirivimab\imdevimab infusion - Provided patient with casirivimab\imdevimab fact sheet for patients, parents and caregivers prior to infusion.  Complications: No immediate complications noted.  Discharge: Discharged home   Babs Sciara 07/10/2020

## 2020-07-10 NOTE — Telephone Encounter (Addendum)
Patient called answering service to let us know that her Covid testing came back positive. She is already on prednisone and doxycycline. Overall feels well, slightly better, no distress. Plan discussed with the nurse on call: -ER if high fever, chills, chest pain, difficulty breathing or feeling worse -Monoclonal antibodies could be an option, I left a detailed message to the infusion center [phone] -Suggest PCP follow-up on this information

## 2020-07-11 NOTE — Telephone Encounter (Signed)
Looks like she did get infusion.  Please check and make sure she is doing okay.  Follow-up visit should be set up if not already done so.  Diplomatic Services operational officer)

## 2020-07-11 NOTE — Telephone Encounter (Signed)
Spoke with the pt and she stated she is feeling better since the infusion. Virtual appt scheduled for tomorrow at 1pm.

## 2020-07-12 ENCOUNTER — Encounter: Payer: Self-pay | Admitting: Family Medicine

## 2020-07-12 ENCOUNTER — Telehealth (INDEPENDENT_AMBULATORY_CARE_PROVIDER_SITE_OTHER): Payer: PPO | Admitting: Family Medicine

## 2020-07-12 DIAGNOSIS — I10 Essential (primary) hypertension: Secondary | ICD-10-CM | POA: Diagnosis not present

## 2020-07-12 DIAGNOSIS — U071 COVID-19: Secondary | ICD-10-CM | POA: Diagnosis not present

## 2020-07-12 DIAGNOSIS — J4530 Mild persistent asthma, uncomplicated: Secondary | ICD-10-CM

## 2020-07-12 DIAGNOSIS — K219 Gastro-esophageal reflux disease without esophagitis: Secondary | ICD-10-CM | POA: Diagnosis not present

## 2020-07-12 MED ORDER — AMLODIPINE BESYLATE 10 MG PO TABS
10.0000 mg | ORAL_TABLET | Freq: Every day | ORAL | 3 refills | Status: DC
Start: 2020-07-12 — End: 2021-07-02

## 2020-07-12 MED ORDER — LOSARTAN POTASSIUM 100 MG PO TABS
100.0000 mg | ORAL_TABLET | Freq: Every day | ORAL | 3 refills | Status: DC
Start: 2020-07-12 — End: 2021-07-02

## 2020-07-12 MED ORDER — OMEPRAZOLE 40 MG PO CPDR
DELAYED_RELEASE_CAPSULE | ORAL | 3 refills | Status: DC
Start: 2020-07-12 — End: 2021-07-13

## 2020-07-12 NOTE — Progress Notes (Signed)
Virtual Visit via Video Note  I connected with Brianna Mccarty  on 07/12/20 at  1:00 PM EDT by a video enabled telemedicine application and verified that I am speaking with the correct person using two identifiers.  Location patient: home Location provider: [facility]  [place], [place] [address] Persons participating in the virtual visit: patient, provider  Video feed not working; completed call through telephone.  I discussed the limitations of evaluation and management by telemedicine and the availability of in person appointments. The patient expressed understanding and agreed to proceed.   Brianna Mccarty DOB: 01-08-1941 Encounter date: 07/12/2020  This is a 79 y.o. female who presents with Chief Complaint  Patient presents with   Covid Exposure    follow up    History of present illness:  Started with nasal congestion on 8/30; then started with fever 8/31. Virtual visit on 9/1 where COVID testing recommended and doxy/pred given pending results. Called answering service on 9/6 to report positive covid test. On call provider left her information on infusion center line and she was able to get this on 07/10/20.   Feeling better. Last week and first of this week was really having a hard time. Temp highest was 101.4. pulse ox has been stable, heart rate and bp have been ok. No fever today 98.5.   Still on the doxy and pred.   Bp: 131/59 with HR 56. O2 95%  Just feels tired, weak. Hasn't wanted much to eat. Forcing self to eat. Drinking plenty of fluids.   States that breathing is pretty good overall. Winded if walking through house. Doing breathing tx as needed - hasn't used one since last night. She is still coughing out a lot and blowing out a lot.   Cough was so severe before, just couldn't control it and diff than asthma/bronchitis cough.    Allergies  Allergen Reactions   Cephalexin     Reaction was a high fever   Hydrochlorothiazide      hyponatremia   Montelukast Sodium Other (See Comments)    hyponatremia   Penicillins Hives and Swelling    Swelling of arms & face Has patient had a PCN reaction causing immediate rash, facial/tongue/throat swelling, SOB or lightheadedness with hypotension: Yes Has patient had a PCN reaction causing severe rash involving mucus membranes or skin necrosis: No Has patient had a PCN reaction that required hospitalization: No Has patient had a PCN reaction occurring within the last 10 years: No If all of the above answers are "NO", then may proceed with Cephalosporin use.    Phenobarbital Hives   Rosuvastatin Other (See Comments)    Reports causes lower extremity muscle aches   Tape Rash    MEDICAL TAPE   Current Meds  Medication Sig   albuterol (PROAIR HFA) 108 (90 Base) MCG/ACT inhaler Inhale 2 puffs into the lungs every 6 (six) hours as needed for wheezing.   albuterol (PROVENTIL) (2.5 MG/3ML) 0.083% nebulizer solution Take 3 mLs (2.5 mg total) by nebulization every 6 (six) hours as needed.   alendronate (FOSAMAX) 70 MG tablet Take 1 tablet (70 mg total) by mouth every 7 (seven) days. Take with a full glass of water on an empty stomach.   amLODipine (NORVASC) 10 MG tablet Take 1 tablet (10 mg total) by mouth daily.   Ascorbic Acid (VITAMIN C) 500 MG tablet Take 500 mg by mouth daily.     aspirin 81 MG tablet Take 81 mg  by mouth daily.     aspirin-acetaminophen-caffeine (EXCEDRIN MIGRAINE) 250-250-65 MG tablet Take by mouth every 6 (six) hours as needed for headache.   azelastine (ASTELIN) 0.1 % nasal spray Instill 2 sprays into each nostril two times a day as needed for allergies   Bempedoic Acid (NEXLETOL) 180 MG TABS Take 1 tablet by mouth daily.   budesonide-formoterol (SYMBICORT) 160-4.5 MCG/ACT inhaler Inhale 2 puffs 2 (two) times daily into the lungs.   Calcium Carbonate (CALCIUM 600) 1500 MG TABS Take 1 tablet by mouth daily.     Cholecalciferol (VITAMIN D3) 5000  UNITS CAPS Take 5,000 Units by mouth daily.    Cinnamon 500 MG TABS Take 1 tablet by mouth daily.    clindamycin (CLEOCIN) 300 MG capsule FOR DENTAL WORK TAKES ALL 1 HOUR BEFORE DENTAL WORK   clobetasol cream (TEMOVATE) 4.49 % Apply 1 application topically as needed (VULVAR IRRITATION).   Coenzyme Q10 (CO Q-10) 200 MG CAPS Take 200 mg by mouth daily.   desmopressin (DDAVP) 0.2 MG tablet Take 0.6 mg by mouth at bedtime.    doxycycline (VIBRAMYCIN) 100 MG capsule Take 1 capsule (100 mg total) by mouth 2 (two) times daily.   EPINEPHrine 0.3 mg/0.3 mL IJ SOAJ injection See admin instructions.   fexofenadine (ALLEGRA) 180 MG tablet Take 180 mg by mouth daily.    furosemide (LASIX) 20 MG tablet TAKE 1 TABLET BY MOUTH EVERY DAY   hydrALAZINE (APRESOLINE) 50 MG tablet TAKE 1 TABLET BY MOUTH 3 TIMES A DAY AND TAKE EXTRA TABLET IF SBP>160   hydrOXYzine (ATARAX/VISTARIL) 10 MG tablet Take 10 mg by mouth at bedtime.    losartan (COZAAR) 100 MG tablet Take 1 tablet (100 mg total) by mouth daily.   magnesium oxide (MAG-OX) 400 MG tablet Take 400 mg by mouth daily.     miconazole (MICOTIN) 2 % powder Apply topically as needed for itching.   montelukast (SINGULAIR) 10 MG tablet Take 1 tablet by mouth daily in the afternoon.   Multiple Vitamins-Minerals (ICAPS AREDS 2 PO) Take by mouth daily.   omeprazole (PRILOSEC) 40 MG capsule TAKE 1 CAPSULE BY MOUTH EVERY DAY.   pentosan polysulfate (ELMIRON) 100 MG capsule Take 100 mg by mouth 3 (three) times daily before meals.     prazosin (MINIPRESS) 5 MG capsule *NEED OFFICE VISIT* TAKE 1 CAPSULE BY MOUTH TWICE A DAY   Probiotic Product (PROBIOTIC PO) Take 1 capsule by mouth daily.    triamcinolone ointment (KENALOG) 0.5 % Apply 1 application topically 2 (two) times daily.   UNABLE TO FIND Med Name: JUICE PLUS TAKES 6 CAPS PER DAY   UNABLE TO FIND Med Name: NOPOLEA 2 OUNCES PER DAY   UNABLE TO FIND Med Name: JUICE PLUS OMEGA 2 PER DAY    valACYclovir (VALTREX) 1000 MG tablet as needed.   [DISCONTINUED] amLODipine (NORVASC) 10 MG tablet TAKE 1 TABLET BY MOUTH EVERY DAY   [DISCONTINUED] losartan (COZAAR) 100 MG tablet Take 1 tablet (100 mg total) by mouth daily.   [DISCONTINUED] omeprazole (PRILOSEC) 40 MG capsule TAKE 1 CAPSULE BY MOUTH EVERY DAY.**PATIENT NEEDS APPT PER DOCTOR**    Review of Systems  Constitutional: Negative for chills, fatigue and fever.  Respiratory: Negative for cough, chest tightness, shortness of breath and wheezing.   Cardiovascular: Negative for chest pain, palpitations and leg swelling.    Objective:  There were no vitals taken for this visit.      BP Readings from Last 3 Encounters:  07/10/20 139/66  07/05/20 133/64  05/23/20 138/68   Wt Readings from Last 3 Encounters:  07/05/20 184 lb (83.5 kg)  05/23/20 184 lb 6.4 oz (83.6 kg)  05/12/20 182 lb 6.4 oz (82.7 kg)    EXAM:  GENERAL: sounds alert, oriented, appears well and in no acute distress  LUNGS: frequent coughing on the phone. Does not sound short of breath.   PSYCH/NEURO: pleasant and cooperative, no obvious depression or anxiety, speech and thought processing grossly intact   Assessment/Plan  1. COVID-19 She has received antibody infusion and is feeling better. I have encouraged her to reach out with any worsening of symptoms. We discussed importance of fluids, rest, and expectation for symptom progression/improvement.  2. Essential hypertension Has been well controlled. She will continue current medications and continue to monitor.  3. Mild persistent asthma without complication Breathing has been stable. Doing well with this.   4. Gastroesophageal reflux disease without esophagitis Stable. Refilled medication today.     I discussed the assessment and treatment plan with the patient. The patient was provided an opportunity to ask questions and all were answered. The patient agreed with the plan and demonstrated  an understanding of the instructions.   The patient was advised to call back or seek an in-person evaluation if the symptoms worsen or if the condition fails to improve as anticipated.  I provided 30 minutes of non-face-to-face time during this encounter.   Micheline Rough, MD

## 2020-07-18 ENCOUNTER — Telehealth: Payer: Self-pay | Admitting: Family Medicine

## 2020-07-18 NOTE — Telephone Encounter (Signed)
Prolonged fever can be seen in many individuals with Covid infection.  If still having fever this long out would consider follow up in post-Covid clinic- but I would defer to opinion of Dr Ethlyn Gallery.

## 2020-07-18 NOTE — Telephone Encounter (Signed)
The patient called to see if Dr. Elease Hashimoto or Dr. Ethlyn Gallery can call her in another antibiotic or another medication. She finished the medication on 09/11 that Dr. Elease Hashimoto prescribed her on 09/01 and she has been running a fever of 100.4 everyday.  The patient also did a virtual visit with Dr. Ethlyn Gallery on 09/08.  Please advise

## 2020-07-19 NOTE — Telephone Encounter (Signed)
Left a message for the pt to return my call.  

## 2020-07-19 NOTE — Telephone Encounter (Signed)
Patient called back and was informed of the message below.  PCP is out of the office and I gave the pt the phone number to contact the care clinic at [phone] for an appt.

## 2020-07-19 NOTE — Telephone Encounter (Signed)
Noted thanks °

## 2020-07-24 ENCOUNTER — Ambulatory Visit (INDEPENDENT_AMBULATORY_CARE_PROVIDER_SITE_OTHER): Payer: PPO | Admitting: Nurse Practitioner

## 2020-07-24 ENCOUNTER — Other Ambulatory Visit: Payer: Self-pay

## 2020-07-24 VITALS — BP 118/78 | HR 78 | Temp 99.1°F | Ht 62.0 in | Wt 179.0 lb

## 2020-07-24 DIAGNOSIS — Z8616 Personal history of COVID-19: Secondary | ICD-10-CM

## 2020-07-24 DIAGNOSIS — R5081 Fever presenting with conditions classified elsewhere: Secondary | ICD-10-CM

## 2020-07-24 LAB — POCT URINALYSIS DIPSTICK
Bilirubin, UA: NEGATIVE
Blood, UA: NEGATIVE
Glucose, UA: NEGATIVE
Ketones, UA: NEGATIVE
Leukocytes, UA: NEGATIVE
Nitrite, UA: NEGATIVE
Protein, UA: NEGATIVE
Spec Grav, UA: <=1.005 — AB (ref 1.010–1.025)
Urobilinogen, UA: 0.2 E.U./dL
pH, UA: 6 (ref 5.0–8.0)

## 2020-07-24 NOTE — Patient Instructions (Addendum)
Covid 19:   Stay well hydrated  Stay active  Deep breathing exercises  May start vitamin C 2,000 mg daily, vitamin D3 2,000 IU daily, Zinc 220 mg daily, and Quercetin 500 mg twice daily  May take tylenol or fever or pain  May take mucinex DM twice daily  Will order chest x ray  Will check labs including UA  Follow up:  Follow up as needed

## 2020-07-24 NOTE — Progress Notes (Signed)
@Patient  ID: Brianna Mccarty, female    DOB: 1941-04-23, 79 y.o.   MRN: [id number]  Chief Complaint  Patient presents with  . Covid Positive    Positive 9/3, Infusion 9/6 Sx: low grade fever     Referring provider: Caren Macadam, MD   79 year old female with history of hypertension, mitral valve disease, migraines, hypertension, CAD, allergic rhinitis, asthma, OSA, GERD. Diagnosed with Covid on 07/07/2020.  HPI  Patient presents today for post COVID care clinic visit. Patient did receive monoclonal antibody infusion on 07/10/2020. She was diagnosed with Covid on 07/07/2020. She was also prescribed doxycycline and prednisone by PCP. She states that overall she is feeling improved. She complains of lingering fever. This does seem to be improving but she states that she does have a low-grade fever around 99 F in the afternoons. Denies f/c/s, n/v/d, hemoptysis, PND, chest pain or edema.       Allergies  Allergen Reactions  . Cephalexin     Reaction was a high fever  . Hydrochlorothiazide     hyponatremia  . Montelukast Sodium Other (See Comments)    hyponatremia  . Penicillins Hives and Swelling    Swelling of arms & face Has patient had a PCN reaction causing immediate rash, facial/tongue/throat swelling, SOB or lightheadedness with hypotension: Yes Has patient had a PCN reaction causing severe rash involving mucus membranes or skin necrosis: No Has patient had a PCN reaction that required hospitalization: No Has patient had a PCN reaction occurring within the last 10 years: No If all of the above answers are "NO", then may proceed with Cephalosporin use.   Marland Kitchen Phenobarbital Hives  . Rosuvastatin Other (See Comments)    Reports causes lower extremity muscle aches  . Tape Rash    MEDICAL TAPE    Immunization History  Administered Date(s) Administered  . Fluad Quad(high Dose 65+) 07/02/2019  . Influenza Split 09/18/2011, 08/11/2012, 07/28/2014  . Influenza Whole  07/21/2008, 08/04/2009, 08/01/2010  . Influenza, High Dose Seasonal PF 07/19/2013, 07/28/2015, 09/01/2017, 08/04/2018  . Influenza,inj,Quad PF,6+ Mos 07/03/2016  . PFIZER SARS-COV-2 Vaccination 11/20/2019, 12/08/2019  . Pneumococcal Conjugate-13 12/06/2013  . Pneumococcal Polysaccharide-23 10/03/2008, 07/12/2019  . Tdap 11/04/2008, 10/24/2015  . Zoster 11/05/2003  . Zoster Recombinat (Shingrix) 07/30/2019, 09/03/2019, 02/21/2020    Past Medical History:  Diagnosis Date  . Abnormal EKG    Normal LV function in the past  . Allergic rhinitis   . Asthma   . Bronchitis, mucopurulent recurrent ([facility])   . Carotid artery disease ([facility])    a. mild by carotid duplex.  . Cervical dysplasia 1971  . Diverticulosis of colon   . DJD (degenerative joint disease)   . GERD (gastroesophageal reflux disease)   . Headache(784.0)   . Hypercholesterolemia   . Hypertension   . Interstitial cystitis    sees urologist  . Lichen sclerosus   . Malignant melanoma ([facility]) 2006   sees Dr. [place] Crane in dermatology  . Migraines   . Mild aortic stenosis   . Mitral valve disease    Question mitral valve prolapse in the past, no prolapse by echo 2009  . Murmur 10/20/2015   SEES DR Meda Coffee  . OSA (obstructive sleep apnea)    USES DENTAL DEVICE  . Osteoporosis    on fosomax > 5 years, stopped 11/2015  . Renal calculus    sees urologist  . Thyroid cyst    1 x 1.1 thyroid cyst noted on carotid Doppler, January, 2012  .  Venous insufficiency     Tobacco History: Social History   Tobacco Use  Smoking Status Never Smoker  Smokeless Tobacco Never Used   Counseling given: Not Answered   Outpatient Encounter Medications as of 07/24/2020  Medication Sig  . albuterol (PROAIR HFA) 108 (90 Base) MCG/ACT inhaler Inhale 2 puffs into the lungs every 6 (six) hours as needed for wheezing.  Marland Kitchen albuterol (PROVENTIL) (2.5 MG/3ML) 0.083% nebulizer solution Take 3 mLs (2.5 mg total) by nebulization every 6 (six) hours as  needed.  Marland Kitchen alendronate (FOSAMAX) 70 MG tablet Take 1 tablet (70 mg total) by mouth every 7 (seven) days. Take with a full glass of water on an empty stomach.  Marland Kitchen amLODipine (NORVASC) 10 MG tablet Take 1 tablet (10 mg total) by mouth daily.  . Ascorbic Acid (VITAMIN C) 500 MG tablet Take 500 mg by mouth daily.    Marland Kitchen aspirin 81 MG tablet Take 81 mg by mouth daily.    Marland Kitchen aspirin-acetaminophen-caffeine (EXCEDRIN MIGRAINE) 250-250-65 MG tablet Take by mouth every 6 (six) hours as needed for headache.  Marland Kitchen azelastine (ASTELIN) 0.1 % nasal spray Instill 2 sprays into each nostril two times a day as needed for allergies  . Bempedoic Acid (NEXLETOL) 180 MG TABS Take 1 tablet by mouth daily.  . budesonide-formoterol (SYMBICORT) 160-4.5 MCG/ACT inhaler Inhale 2 puffs 2 (two) times daily into the lungs.  . Calcium Carbonate (CALCIUM 600) 1500 MG TABS Take 1 tablet by mouth daily.    . Cholecalciferol (VITAMIN D3) 5000 UNITS CAPS Take 5,000 Units by mouth daily.   . Cinnamon 500 MG TABS Take 1 tablet by mouth daily.   . Coenzyme Q10 (CO Q-10) 200 MG CAPS Take 200 mg by mouth daily.  Marland Kitchen desmopressin (DDAVP) 0.2 MG tablet Take 0.6 mg by mouth at bedtime.   Marland Kitchen EPINEPHrine 0.3 mg/0.3 mL IJ SOAJ injection See admin instructions.  . fexofenadine (ALLEGRA) 180 MG tablet Take 180 mg by mouth daily.   . furosemide (LASIX) 20 MG tablet TAKE 1 TABLET BY MOUTH EVERY DAY  . hydrALAZINE (APRESOLINE) 50 MG tablet TAKE 1 TABLET BY MOUTH 3 TIMES A DAY AND TAKE EXTRA TABLET IF SBP>160  . hydrOXYzine (ATARAX/VISTARIL) 10 MG tablet Take 10 mg by mouth at bedtime.   Marland Kitchen losartan (COZAAR) 100 MG tablet Take 1 tablet (100 mg total) by mouth daily.  . magnesium oxide (MAG-OX) 400 MG tablet Take 400 mg by mouth daily.    . miconazole (MICOTIN) 2 % powder Apply topically as needed for itching.  . montelukast (SINGULAIR) 10 MG tablet Take 1 tablet by mouth daily in the afternoon.  . Multiple Vitamins-Minerals (ICAPS AREDS 2 PO) Take by mouth  daily.  Marland Kitchen omeprazole (PRILOSEC) 40 MG capsule TAKE 1 CAPSULE BY MOUTH EVERY DAY.  Marland Kitchen pentosan polysulfate (ELMIRON) 100 MG capsule Take 100 mg by mouth 3 (three) times daily before meals.    . prazosin (MINIPRESS) 5 MG capsule *NEED OFFICE VISIT* TAKE 1 CAPSULE BY MOUTH TWICE A DAY  . Probiotic Product (PROBIOTIC PO) Take 1 capsule by mouth daily.   Marland Kitchen triamcinolone ointment (KENALOG) 0.5 % Apply 1 application topically 2 (two) times daily.  . valACYclovir (VALTREX) 1000 MG tablet as needed.  . clobetasol cream (TEMOVATE) 9.56 % Apply 1 application topically as needed (VULVAR IRRITATION). (Patient not taking: Reported on 07/24/2020)  . UNABLE TO FIND Med Name: JUICE PLUS TAKES 6 CAPS PER DAY  . UNABLE TO FIND Med Name: NOPOLEA 2 OUNCES PER  DAY  . UNABLE TO FIND Med Name: [place] 2 PER DAY  . [DISCONTINUED] clindamycin (CLEOCIN) 300 MG capsule FOR DENTAL WORK TAKES ALL 1 HOUR BEFORE DENTAL WORK  . [DISCONTINUED] doxycycline (VIBRAMYCIN) 100 MG capsule Take 1 capsule (100 mg total) by mouth 2 (two) times daily.   No facility-administered encounter medications on file as of 07/24/2020.     Review of Systems  Review of Systems  Constitutional: Positive for fever.  HENT: Negative.   Respiratory: Negative for cough and shortness of breath.   Cardiovascular: Negative.   Gastrointestinal: Negative.   Allergic/Immunologic: Negative.   Neurological: Negative.   Psychiatric/Behavioral: Negative.        Physical Exam  BP 118/78 (BP Location: Left Arm)   Pulse 78   Temp 99.1 F (37.3 C)   Ht 5\' 2"  (1.575 m)   Wt 179 lb (81.2 kg)   SpO2 96%   BMI 32.74 kg/m   Wt Readings from Last 5 Encounters:  07/24/20 179 lb (81.2 kg)  07/05/20 184 lb (83.5 kg)  05/23/20 184 lb 6.4 oz (83.6 kg)  05/12/20 182 lb 6.4 oz (82.7 kg)  01/20/20 181 lb (82.1 kg)     Physical Exam Vitals and nursing note reviewed.  Constitutional:      General: She is not in acute distress.    Appearance:  She is well-developed.  Cardiovascular:     Rate and Rhythm: Normal rate and regular rhythm.  Pulmonary:     Effort: Pulmonary effort is normal.     Breath sounds: Normal breath sounds.  Musculoskeletal:     Right lower leg: No edema.     Left lower leg: No edema.  Neurological:     Mental Status: She is alert and oriented to person, place, and time.  Psychiatric:        Mood and Affect: Mood normal.        Behavior: Behavior normal.       Imaging: DG Chest 2 View  Result Date: 07/25/2020 CLINICAL DATA:  Cough EXAM: CHEST - 2 VIEW COMPARISON:  05/17/2020 chest radiograph and prior. FINDINGS: Cardiomediastinal silhouette is unchanged. Hazy right basilar opacities. No pneumothorax or pleural effusion. No acute osseous abnormality. IMPRESSION: Hazy right basilar opacities, atelectasis versus infection. Cardiomegaly. Electronically Signed   By: Primitivo Gauze M.D.   On: 07/25/2020 14:15     Assessment & Plan:   History of COVID-19 Covid 19:   Stay well hydrated  Stay active  Deep breathing exercises  May start vitamin C 2,000 mg daily, vitamin D3 2,000 IU daily, Zinc 220 mg daily, and Quercetin 500 mg twice daily  May take tylenol or fever or pain  May take mucinex DM twice daily  Will order chest x ray  Will check labs including UA  Follow up:  Follow up as needed      [name], NP 07/26/2020

## 2020-07-25 ENCOUNTER — Ambulatory Visit: Payer: PPO | Admitting: Cardiology

## 2020-07-25 ENCOUNTER — Ambulatory Visit
Admission: RE | Admit: 2020-07-25 | Discharge: 2020-07-25 | Disposition: A | Discharge status: Home / Self Care | Payer: PPO | Source: Ambulatory Visit | Attending: Nurse Practitioner | Admitting: Nurse Practitioner

## 2020-07-25 DIAGNOSIS — R05 Cough: Secondary | ICD-10-CM | POA: Diagnosis not present

## 2020-07-25 LAB — CBC
Hematocrit: 33.7 % — ABNORMAL LOW (ref 34.0–46.6)
Hemoglobin: 11.3 g/dL (ref 11.1–15.9)
MCH: 29.3 pg (ref 26.6–33.0)
MCHC: 33.5 g/dL (ref 31.5–35.7)
MCV: 87 fL (ref 79–97)
Platelets: 217 10*3/uL (ref 150–450)
RBC: 3.86 x10E6/uL (ref 3.77–5.28)
RDW: 12.9 % (ref 11.7–15.4)
WBC: 7.1 10*3/uL (ref 3.4–10.8)

## 2020-07-25 LAB — COMPREHENSIVE METABOLIC PANEL
ALT: 19 IU/L (ref 0–32)
AST: 21 IU/L (ref 0–40)
Albumin/Globulin Ratio: 1.6 (ref 1.2–2.2)
Albumin: 4.1 g/dL (ref 3.7–4.7)
Alkaline Phosphatase: 58 IU/L (ref 44–121)
BUN/Creatinine Ratio: 16 (ref 12–28)
BUN: 12 mg/dL (ref 8–27)
Bilirubin Total: 0.4 mg/dL (ref 0.0–1.2)
CO2: 26 mmol/L (ref 20–29)
Calcium: 9.3 mg/dL (ref 8.7–10.3)
Chloride: 103 mmol/L (ref 96–106)
Creatinine, Ser: 0.77 mg/dL (ref 0.57–1.00)
GFR calc Af Amer: 84 mL/min/{1.73_m2} (ref >59)
GFR calc non Af Amer: 73 mL/min/{1.73_m2} (ref >59)
Globulin, Total: 2.5 g/dL (ref 1.5–4.5)
Glucose: 93 mg/dL (ref 65–99)
Potassium: 4.3 mmol/L (ref 3.5–5.2)
Sodium: 141 mmol/L (ref 134–144)
Total Protein: 6.6 g/dL (ref 6.0–8.5)

## 2020-07-26 ENCOUNTER — Other Ambulatory Visit: Payer: Self-pay | Admitting: Nurse Practitioner

## 2020-07-26 DIAGNOSIS — Z8616 Personal history of COVID-19: Secondary | ICD-10-CM | POA: Insufficient documentation

## 2020-07-26 DIAGNOSIS — U071 COVID-19: Secondary | ICD-10-CM

## 2020-07-26 DIAGNOSIS — R509 Fever, unspecified: Secondary | ICD-10-CM | POA: Insufficient documentation

## 2020-07-26 NOTE — Assessment & Plan Note (Signed)
Covid 19:   Stay well hydrated  Stay active  Deep breathing exercises  May start vitamin C 2,000 mg daily, vitamin D3 2,000 IU daily, Zinc 220 mg daily, and Quercetin 500 mg twice daily  May take tylenol or fever or pain  May take mucinex DM twice daily  Will order chest x ray  Will check labs including UA  Follow up:  Follow up as needed

## 2020-07-31 ENCOUNTER — Encounter: Payer: PPO | Admitting: Genetic Counselor

## 2020-07-31 ENCOUNTER — Other Ambulatory Visit: Payer: PPO

## 2020-08-04 DIAGNOSIS — J3081 Allergic rhinitis due to animal (cat) (dog) hair and dander: Secondary | ICD-10-CM | POA: Diagnosis not present

## 2020-08-04 DIAGNOSIS — J301 Allergic rhinitis due to pollen: Secondary | ICD-10-CM | POA: Diagnosis not present

## 2020-08-04 DIAGNOSIS — J3089 Other allergic rhinitis: Secondary | ICD-10-CM | POA: Diagnosis not present

## 2020-08-11 DIAGNOSIS — J301 Allergic rhinitis due to pollen: Secondary | ICD-10-CM | POA: Diagnosis not present

## 2020-08-11 DIAGNOSIS — J3089 Other allergic rhinitis: Secondary | ICD-10-CM | POA: Diagnosis not present

## 2020-08-14 ENCOUNTER — Ambulatory Visit
Admission: RE | Admit: 2020-08-14 | Discharge: 2020-08-14 | Disposition: A | Discharge status: Home / Self Care | Payer: PPO | Source: Ambulatory Visit | Attending: Nurse Practitioner | Admitting: Nurse Practitioner

## 2020-08-14 DIAGNOSIS — U071 COVID-19: Secondary | ICD-10-CM

## 2020-08-14 DIAGNOSIS — J90 Pleural effusion, not elsewhere classified: Secondary | ICD-10-CM | POA: Diagnosis not present

## 2020-08-16 DIAGNOSIS — J301 Allergic rhinitis due to pollen: Secondary | ICD-10-CM | POA: Diagnosis not present

## 2020-08-16 DIAGNOSIS — J3089 Other allergic rhinitis: Secondary | ICD-10-CM | POA: Diagnosis not present

## 2020-08-21 DIAGNOSIS — J301 Allergic rhinitis due to pollen: Secondary | ICD-10-CM | POA: Diagnosis not present

## 2020-08-21 DIAGNOSIS — J3089 Other allergic rhinitis: Secondary | ICD-10-CM | POA: Diagnosis not present

## 2020-08-23 DIAGNOSIS — J301 Allergic rhinitis due to pollen: Secondary | ICD-10-CM | POA: Diagnosis not present

## 2020-08-23 DIAGNOSIS — J3089 Other allergic rhinitis: Secondary | ICD-10-CM | POA: Diagnosis not present

## 2020-08-23 DIAGNOSIS — J3081 Allergic rhinitis due to animal (cat) (dog) hair and dander: Secondary | ICD-10-CM | POA: Diagnosis not present

## 2020-08-30 DIAGNOSIS — J3089 Other allergic rhinitis: Secondary | ICD-10-CM | POA: Diagnosis not present

## 2020-08-30 DIAGNOSIS — J301 Allergic rhinitis due to pollen: Secondary | ICD-10-CM | POA: Diagnosis not present

## 2020-08-31 ENCOUNTER — Ambulatory Visit: Payer: PPO | Admitting: Physician Assistant

## 2020-09-04 ENCOUNTER — Inpatient Hospital Stay: Payer: PPO | Attending: Genetic Counselor | Admitting: Genetic Counselor

## 2020-09-04 ENCOUNTER — Inpatient Hospital Stay: Payer: PPO

## 2020-09-04 ENCOUNTER — Other Ambulatory Visit: Payer: Self-pay

## 2020-09-04 ENCOUNTER — Other Ambulatory Visit: Payer: Self-pay | Admitting: Genetic Counselor

## 2020-09-04 DIAGNOSIS — Z85820 Personal history of malignant melanoma of skin: Secondary | ICD-10-CM

## 2020-09-04 DIAGNOSIS — Z800 Family history of malignant neoplasm of digestive organs: Secondary | ICD-10-CM | POA: Diagnosis not present

## 2020-09-04 DIAGNOSIS — Z85828 Personal history of other malignant neoplasm of skin: Secondary | ICD-10-CM | POA: Diagnosis not present

## 2020-09-04 LAB — GENETIC SCREENING ORDER

## 2020-09-05 ENCOUNTER — Encounter: Payer: Self-pay | Admitting: Genetic Counselor

## 2020-09-05 DIAGNOSIS — Z800 Family history of malignant neoplasm of digestive organs: Secondary | ICD-10-CM

## 2020-09-05 HISTORY — DX: Family history of malignant neoplasm of digestive organs: Z80.0

## 2020-09-05 NOTE — Progress Notes (Signed)
REFERRING PROVIDER: Stark Klein, MD [address] [place],  [place] [address]  PRIMARY PROVIDER:  Caren Macadam, MD  PRIMARY REASON FOR VISIT:  1. Family history of pancreatic cancer   2. Family history of colon cancer   3. Family history of cancer of extrahepatic bile ducts   4. History of melanoma     HISTORY OF PRESENT ILLNESS:   [name], a 79 y.o. female, was seen for a [facility] cancer genetics consultation at the request of Dr. Barry Dienes due to a family history of pancreatic cancer.  Brianna Mccarty presents to clinic today to discuss the possibility of a hereditary predisposition to cancer, to discuss genetic testing, and to further clarify her future cancer risks, as well as potential cancer risks for family members.   In 2006, at the age of 60, Brianna Mccarty was diagnosed with melanoma on her leg, which was surgically removed.   RISK FACTORS:  Menarche was at age 36.  First live birth at age 66.  OCP use for approximately 2-3 years.  Ovaries intact: yes.  Hysterectomy: no.  Menopausal status: postmenopausal.  HRT use: 5-10 years. Colonoscopy: most recent in 2016 Mammogram within the last year: yes. Number of breast biopsies: 2; benign per patient  Up to date with pelvic exams: most recent pelvic exam 2 years ago. Any excessive radiation exposure in the past: no Other cancer screening: dermatology annually   Past Medical History:  Diagnosis Date   Abnormal EKG    Normal LV function in the past   Allergic rhinitis    Asthma    Bronchitis, mucopurulent recurrent ([facility])    Carotid artery disease ([facility]CC)    a. mild by carotid duplex.   Cervical dysplasia 1971   Diverticulosis of colon    DJD (degenerative joint disease)    Family history of cancer of extrahepatic bile ducts 09/05/2020   Family history of colon cancer 09/05/2020   Family history of pancreatic cancer 09/05/2020   GERD (gastroesophageal reflux disease)    Headache(784.0)     Hypercholesterolemia    Hypertension    Interstitial cystitis    sees urologist   Lichen sclerosus    Malignant melanoma ([facility]) 2006   sees Dr. Nevada Crane in dermatology   Migraines    Mild aortic stenosis    Mitral valve disease    Question mitral valve prolapse in the past, no prolapse by echo 2009   Murmur 10/20/2015   SEES DR NELSON   OSA (obstructive sleep apnea)    USES DENTAL DEVICE   Osteoporosis    on fosomax > 5 years, stopped 11/2015   Renal calculus    sees urologist   Thyroid cyst    1 x 1.1 thyroid cyst noted on carotid Doppler, January, 2012   Venous insufficiency     Past Surgical History:  Procedure Laterality Date   BREAST BIOPSY Left 11/25/2011   U/S core, benign performed [facility], LEFT BREAT MARKER IN   BREAST CYST ASPIRATION     BREAST SURGERY  2013   Breast Bx-Benign   CARDIAC CATHETERIZATION     CATARACT EXTRACTION, BILATERAL     CHOLECYSTECTOMY N/A 12/22/2019   Procedure: LAPAROSCOPIC CHOLECYSTECTOMY;  Surgeon: Ileana Roup, MD;  Location: [facility];  Service: General;  Laterality: N/A;   cystoscopy and basket stone removal right ureter  02/2006   Dr. Terance Hart   GALLBLADDER SURGERY  12/22/2019   [facility]   left  total hip replacement  2004   Dr. Gladstone Lighter   melanoma removed from medial rleft knee area  2006   Dr. Nevada Crane   NASAL SEPTUM SURGERY      Social History   Socioeconomic History   Marital status: Married    Spouse name: Edison Nasuti   Number of children: 2   Years of education: Not on file   Highest education level: Not on file  Occupational History   Occupation: retired    Fish farm manager: RETIRED  Tobacco Use   Smoking status: Never Smoker   Smokeless tobacco: Never Used  Vaping Use   Vaping Use: Never used  Substance and Sexual Activity   Alcohol use: Yes    Alcohol/week: 1.0 standard drink    Types: 1 Standard drinks or equivalent per week    Comment:  OCC WINE    Drug use: No   Sexual activity: Not Currently    Birth control/protection: Post-menopausal    Comment: 1st intercourse 33 yo-1 partner  Other Topics Concern   Not on file  Social History Narrative   Updated 12/13/2019   Work or School: none      Home Situation: lives with husband      Spiritual Beliefs: christian      Lifestyle: regular exercise; healthy diet      12/13/2019: Uses treadmill 3/x weekly.    Looks after sister who has been having memory decline, as well  as husband with minor memory decline.    Enjoys writing poetry, reading, going to church, travel   Social Determinants of Radio broadcast assistant Strain: Low Risk    Difficulty of Paying Living Expenses: Not hard at all  Food Insecurity: No Food Insecurity   Worried About Charity fundraiser in the Last Year: Never true   Arboriculturist in the Last Year: Never true  Transportation Needs: No Transportation Needs   Lack of Transportation (Medical): No   Lack of Transportation (Non-Medical): No  Physical Activity: Sufficiently Active   Days of Exercise per Week: 3 days   Minutes of Exercise per Session: 50 min  Stress: No Stress Concern Present   Feeling of Stress : Only a little  Social Connections: Engineer, building services of Communication with Friends and Family: More than three times a week   Frequency of Social Gatherings with Friends and Family: Once a week   Attends Religious Services: More than 4 times per year   Active Member of [employer] or Organizations: Yes   Attends Music therapist: More than 4 times per year   Marital Status: Married     FAMILY HISTORY:  We obtained a detailed, 4-generation family history.  Significant diagnoses are listed below: Family History  Problem Relation Age of Onset   Pancreatic cancer Father 16   Cancer Brother 82       Bile duct   Colon cancer Paternal Uncle        dx 61s   Lung cancer Paternal Uncle        dx 76s      Brianna Mccarty has one son, age 62, and one daughter, age 28.  Brianna Mccarty has one brother with bile duct cancer diagnosed at 15 and three other siblings without a cancer history.  Brianna Mccarty's mother passed away at age 57 and did not have cancer.  No maternal family history of cancer was reported.  Ms. Lapinski's father was diagnosed with pancreatic cancer at age 16.  Ms. Hakimian had a paternal uncle with colon cancer diagnosed in his 38s and a paternal uncle diagnosed with lung cancer in his 46s.  Ms. Drenning also has a cousin with breast cancer diagnosed in her 76s.  No other paternal family history of cancer was reported.   Ms. Krajewski is unaware of previous family history of genetic testing for hereditary cancer risks. Patient's maternal ancestors are of English/Welsh descent, and paternal ancestors are of English/Welsh descent. There is no reported Ashkenazi Jewish ancestry. There is no known consanguinity.  GENETIC COUNSELING ASSESSMENT: Ms. Betten is a 79 y.o. female with a family history of cancer which is somewhat suggestive of a hereditary cancer syndrome and predisposition to cancer. We, therefore, discussed and recommended the following at today's visit.   DISCUSSION: We discussed that 5 - 10% of cancer is hereditary.  Hereditary pancreatic cancer associated with mutations in BRCA1/2.  There are other genes that can be associated with hereditary pancreatic and colon cancer syndromes.  Type of cancer risk and level of risk are gene-specific. We discussed that testing is beneficial for several reasons, including knowing about other cancer risks, identifying potential screening and risk-reduction options that may be appropriate, and to understanding if other family members could be at risk for cancer and allowing them to undergo genetic testing.  We reviewed the characteristics, features and inheritance patterns of hereditary cancer syndromes. We also discussed genetic testing, including the appropriate  family members to test, the process of testing, insurance coverage and turn-around-time for results. We discussed the implications of a negative, positive, carrier and/or variant of uncertain significant result. We discussed that negative results would be uninformative given that Ms. Hamler does not have a personal history of cancer. We recommended Ms. Grist pursue genetic testing for a panel that contains genes associated with pancreatic and colon cancers.  Ms. Marandola was offered a common hereditary cancer panel (48 genes) and an expanded pan-cancer panel (85 genes). Ms. Dever was informed of the benefits and limitations of each panel, including that expanded pan-cancer panels contain several preliminary evidence genes that do not have clear management guidelines at this point in time.  We also discussed that as the number of genes included on a panel increases, the chances of variants of uncertain significance increases.  After considering the benefits and limitations of each gene panel, Ms. Riebe elected to have an expanded pan-cancer panel through [facility].  The Multi-Cancer Panel offered by [facility] includes sequencing and/or deletion duplication testing of the following 85 genes: AIP, ALK, APC, ATM, AXIN2,BAP1,  BARD1, BLM, BMPR1A, BRCA1, BRCA2, BRIP1, CASR, CDC73, CDH1, CDK4, CDKN1B, CDKN1C, CDKN2A (p14ARF), CDKN2A (p16INK4a), CEBPA, CHEK2, CTNNA1, DICER1, DIS3L2, EGFR (c.2369C>T, p.Thr790Met variant only), EPCAM (Deletion/duplication testing only), FH, FLCN, GATA2, GPC3, GREM1 (Promoter region deletion/duplication testing only), HOXB13 (c.251G>A, p.Gly84Glu), HRAS, KIT, MAX, MEN1, MET, MITF (c.952G>A, p.Glu318Lys variant only), MLH1, MSH2, MSH3, MSH6, MUTYH, NBN, NF1, NF2, NTHL1, PALB2, PDGFRA, PHOX2B, PMS2, POLD1, POLE, POT1, PRKAR1A, PTCH1, PTEN, RAD50, RAD51C, RAD51D, RB1, RECQL4, RET, RNF43, RUNX1, SDHAF2, SDHA (sequence changes only), SDHB, SDHC, SDHD, SMAD4, SMARCA4, SMARCB1, SMARCE1, STK11, SUFU,  TERC, TERT, TMEM127, TP53, TSC1, TSC2, VHL, WRN and WT1.   Based on Ms. Macias's family history of cancer, she meets medical criteria for genetic testing. Despite that she meets criteria, she may still have an out of pocket cost. We discussed that if her out of pocket cost for testing is over $100, the laboratory will call and confirm whether she wants to proceed with testing.  If the out of pocket cost of testing is less than $100 she will be billed by the genetic testing laboratory.   We discussed that some people do not want to undergo genetic testing due to fear of genetic discrimination.  A federal law called the Genetic Information Non-Discrimination Act (GINA) of 2008 helps protect individuals against genetic discrimination based on their genetic test results.  It impacts both health insurance and employment.  With health insurance, it protects against increased premiums, being kicked off insurance or being forced to take a test in order to be insured.  For employment it protects against hiring, firing and promoting decisions based on genetic test results.  GINA does not apply to those in the [employer], those who work for companies with less than 15 employees, and new life insurance or long-term disability insurance policies.  Health status due to a cancer diagnosis is not protected under GINA.  PLAN: After considering the risks, benefits, and limitations, Ms. Sami provided informed consent to pursue genetic testing and the blood sample was sent to [facility] for analysis of the Multi-Cancer Panel. Results should be available within approximately 3 weeks' time, at which point they will be disclosed by telephone to Ms. Tarman, as will any additional recommendations warranted by these results. Ms. Andrades will receive a summary of her genetic counseling visit and a copy of her results once available. This information will also be available in Epic.   Lastly, we encouraged Ms. Campusano to remain in  contact with cancer genetics annually so that we can continuously update the family history and inform her of any changes in cancer genetics and testing that may be of benefit for this family.   Ms. Reitter's questions were answered to her satisfaction today. Our contact information was provided should additional questions or concerns arise. Thank you for the referral and allowing [place] to share in the care of your patient.   Zivah Mayr M. Joette Catching, [place], [facility] Certified Film/video [email or web address] (P) [phone]   The patient was seen for a total of 40 minutes in face-to-face genetic counseling.  This patient was discussed with Drs. Magrinat, Lindi Adie and/or Burr Medico who agrees with the above.   _______________________________________________________________________ For Office Staff:  Number of people involved in session: 1 Was an Intern/ student involved with case: no

## 2020-09-08 DIAGNOSIS — J3089 Other allergic rhinitis: Secondary | ICD-10-CM | POA: Diagnosis not present

## 2020-09-08 DIAGNOSIS — J301 Allergic rhinitis due to pollen: Secondary | ICD-10-CM | POA: Diagnosis not present

## 2020-09-08 DIAGNOSIS — J3081 Allergic rhinitis due to animal (cat) (dog) hair and dander: Secondary | ICD-10-CM | POA: Diagnosis not present

## 2020-09-11 ENCOUNTER — Telehealth: Payer: Self-pay | Admitting: Genetic Counselor

## 2020-09-11 NOTE — Telephone Encounter (Signed)
Revealed negative genetic testing.  Discussed that we do not know why  there is pancreatic cancer in the family. It could be due sporadic, due a mutation she did not inherit, due to a different gene that we are not testing, or maybe our current technology may not be able to pick something up.  It will be important for her to keep in contact with genetics to keep up with whether additional testing may be needed.

## 2020-09-11 NOTE — Telephone Encounter (Signed)
Contacted patient in attempt to disclose results of genetic testing.  LVM with contact information requesting a call back.  

## 2020-09-12 NOTE — Progress Notes (Signed)
Virtual Visit via Video Note   This visit type was conducted due to national recommendations for restrictions regarding the COVID-19 Pandemic (e.g. social distancing) in an effort to limit this patient's exposure and mitigate transmission in our community.  Due to her co-morbid illnesses, this patient is at least at moderate risk for complications without adequate follow up.  This format is felt to be most appropriate for this patient at this time.  All issues noted in this document were discussed and addressed.  A limited physical exam was performed with this format.  Please refer to the patient's chart for her consent to telehealth for [facility].       Date:  09/13/2020   ID:  Brianna Mccarty, DOB Apr 30, 1941, MRN [id number] The patient was identified using 2 identifiers.  Patient Location: Home Provider Location: Home Office  PCP:  Caren Macadam, MD  Cardiologist:  Ena Dawley, MD Electrophysiologist:  None   Evaluation Performed:  Follow-Up Visit  Chief Complaint:  3 months follow up   History of Present Illness:    Brianna Mccarty is a 79 y.o. female with hx of HTN, HLD, OSA on dentures and AS seen for follow up.   Echo 06/2018 showed normal LVEF and grade 1 DD. Mild AS and left atrium is severely dilated.   Recently dx with COVID 07/2020. He did receive monoclonal antibody infusion.  Seen virtually for follow-up.  Patient is under a lot of stress because of responsibilities.  Eating high salt diet.  She used to exercise 3 times per week but unable to do so since got Covid because of a lot of responsibilities. She denies chest pain, shortness of breath, orthopnea, PND, syncope, lower extremity edema or melena.  The patient does not have symptoms concerning for COVID-19 infection (fever, chills, cough, or new shortness of breath).    Past Medical History:  Diagnosis Date  . Abnormal EKG    Normal LV function in the past  . Allergic rhinitis   . Asthma   .  Bronchitis, mucopurulent recurrent ([place])   . Carotid artery disease (HCC)    a. mild by carotid duplex.  . Cervical dysplasia 1971  . Diverticulosis of colon   . DJD (degenerative joint disease)   . Family history of cancer of extrahepatic bile ducts 09/05/2020  . Family history of colon cancer 09/05/2020  . Family history of pancreatic cancer 09/05/2020  . GERD (gastroesophageal reflux disease)   . Headache(784.0)   . Hypercholesterolemia   . Hypertension   . Interstitial cystitis    sees urologist  . Lichen sclerosus   . Malignant melanoma ([place]) 2006   sees Dr. [place] Crane in dermatology  . Migraines   . Mild aortic stenosis   . Mitral valve disease    Question mitral valve prolapse in the past, no prolapse by echo 2009  . Murmur 10/20/2015   SEES DR Meda Coffee  . OSA (obstructive sleep apnea)    USES DENTAL DEVICE  . Osteoporosis    on fosomax > 5 years, stopped 11/2015  . Renal calculus    sees urologist  . Thyroid cyst    1 x 1.1 thyroid cyst noted on carotid Doppler, January, 2012  . Venous insufficiency    Past Surgical History:  Procedure Laterality Date  . BREAST BIOPSY Left 11/25/2011   U/S core, benign performed at [facility], LEFT BREAT MARKER IN  . BREAST CYST ASPIRATION    . BREAST SURGERY  2013  Breast Bx-Benign  . CARDIAC CATHETERIZATION    . CATARACT EXTRACTION, BILATERAL    . CHOLECYSTECTOMY N/A 12/22/2019   Procedure: LAPAROSCOPIC CHOLECYSTECTOMY;  Surgeon: Ileana Roup, MD;  Location: [facility];  Service: General;  Laterality: N/A;  . cystoscopy and basket stone removal right ureter  02/2006   Dr. Terance Hart  . GALLBLADDER SURGERY  12/22/2019  . GYNECOLOGIC CRYOSURGERY  1971  . left total hip replacement  2004   Dr. Gladstone Lighter  . melanoma removed from medial rleft knee area  2006   Dr. Nevada Crane  . NASAL SEPTUM SURGERY       Current Meds  Medication Sig  . albuterol (PROAIR HFA) 108 (90 Base) MCG/ACT inhaler Inhale 2 puffs into the lungs every 6  (six) hours as needed for wheezing.  Marland Kitchen albuterol (PROVENTIL) (2.5 MG/3ML) 0.083% nebulizer solution Take 3 mLs (2.5 mg total) by nebulization every 6 (six) hours as needed.  Marland Kitchen alendronate (FOSAMAX) 70 MG tablet Take 1 tablet (70 mg total) by mouth every 7 (seven) days. Take with a full glass of water on an empty stomach.  Marland Kitchen amLODipine (NORVASC) 10 MG tablet Take 1 tablet (10 mg total) by mouth daily.  . Ascorbic Acid (VITAMIN C) 500 MG tablet Take 500 mg by mouth daily.    Marland Kitchen aspirin 81 MG tablet Take 81 mg by mouth daily.    Marland Kitchen aspirin-acetaminophen-caffeine (EXCEDRIN MIGRAINE) 250-250-65 MG tablet Take by mouth every 6 (six) hours as needed for headache.  Marland Kitchen azelastine (ASTELIN) 0.1 % nasal spray Instill 2 sprays into each nostril two times a day as needed for allergies  . Bempedoic Acid (NEXLETOL) 180 MG TABS Take 1 tablet by mouth daily.  . budesonide-formoterol (SYMBICORT) 160-4.5 MCG/ACT inhaler Inhale 2 puffs 2 (two) times daily into the lungs.  . Calcium Carbonate (CALCIUM 600) 1500 MG TABS Take 1 tablet by mouth daily.    . Cholecalciferol (VITAMIN D3) 5000 UNITS CAPS Take 5,000 Units by mouth daily.   . Cinnamon 500 MG TABS Take 1 tablet by mouth daily.   . clobetasol (TEMOVATE) 0.05 % external solution Apply topically.  . Coenzyme Q10 (CO Q-10) 200 MG CAPS Take 200 mg by mouth daily.  Marland Kitchen desmopressin (DDAVP) 0.2 MG tablet Take 0.6 mg by mouth at bedtime.   Marland Kitchen EPINEPHrine 0.3 mg/0.3 mL IJ SOAJ injection See admin instructions.  . fexofenadine (ALLEGRA) 180 MG tablet Take 180 mg by mouth daily.   . furosemide (LASIX) 20 MG tablet TAKE 1 TABLET BY MOUTH EVERY DAY  . hydrALAZINE (APRESOLINE) 50 MG tablet TAKE 1 TABLET BY MOUTH 3 TIMES A DAY AND TAKE EXTRA TABLET IF SBP>160  . hydrOXYzine (ATARAX/VISTARIL) 10 MG tablet Take 10 mg by mouth at bedtime.   Marland Kitchen losartan (COZAAR) 100 MG tablet Take 1 tablet (100 mg total) by mouth daily.  . magnesium oxide (MAG-OX) 400 MG tablet Take 400 mg by mouth  daily.    . miconazole (MICOTIN) 2 % powder Apply topically as needed for itching.  . montelukast (SINGULAIR) 10 MG tablet Take 1 tablet by mouth daily in the afternoon.  . Multiple Vitamins-Minerals (ICAPS AREDS 2 PO) Take by mouth daily.  Marland Kitchen omeprazole (PRILOSEC) 40 MG capsule TAKE 1 CAPSULE BY MOUTH EVERY DAY.  Marland Kitchen pentosan polysulfate (ELMIRON) 100 MG capsule Take 100 mg by mouth 3 (three) times daily before meals.    . prazosin (MINIPRESS) 5 MG capsule *NEED OFFICE VISIT* TAKE 1 CAPSULE BY MOUTH TWICE A DAY  .  Probiotic Product (PROBIOTIC PO) Take 1 capsule by mouth daily.   Marland Kitchen UNABLE TO FIND Med Name: JUICE PLUS TAKES 6 CAPS PER DAY  . UNABLE TO FIND Med Name: NOPOLEA 2 OUNCES PER DAY  . UNABLE TO FIND Med Name: JUICE PLUS OMEGA 2 PER DAY  . valACYclovir (VALTREX) 1000 MG tablet Take 1,000 mg by mouth as needed (FEVER BLISTER).   . [DISCONTINUED] triamcinolone ointment (KENALOG) 0.5 % Apply 1 application topically 2 (two) times daily.     Allergies:   Cephalexin, Hydrochlorothiazide, Penicillins, Phenobarbital, Rosuvastatin, and Tape   Social History   Tobacco Use  . Smoking status: Never Smoker  . Smokeless tobacco: Never Used  Vaping Use  . Vaping Use: Never used  Substance Use Topics  . Alcohol use: Yes    Alcohol/week: 1.0 standard drink    Types: 1 Standard drinks or equivalent per week    Comment:  OCC WINE  . Drug use: No     Family Hx: The patient's family history includes Cancer (age of onset: 79) in her brother; Colon cancer in her paternal uncle; Diabetes in her brother, brother, and mother; Heart disease in her brother and mother; Hypertension in her brother, brother, sister, and sister; Lung cancer in her paternal uncle; Pancreatic cancer (age of onset: 50) in her father.  ROS:   Please see the history of present illness.     All other systems reviewed and are negative.   Prior CV studies:   The following studies were reviewed today:  [facility] 06/2018 Study  Conclusions   - Left ventricle: The cavity size was normal. Wall thickness was  normal. Systolic function was normal. The estimated ejection  fraction was in the range of 55% to 60%. Wall motion was normal;  there were no regional wall motion abnormalities. Doppler  parameters are consistent with abnormal left ventricular  relaxation (grade 1 diastolic dysfunction). Doppler parameters  are consistent with elevated mean left atrial filling pressure.  - Aortic valve: There was mild stenosis.  - Mitral valve: Severely calcified annulus. Mildly thickened  leaflets .  - Left atrium: The atrium was severely dilated.  - Atrial septum: No defect or patent foramen ovale was identified.   Labs/Other Tests and Data Reviewed:    EKG:  No ECG reviewed.  Recent Labs: 05/12/2020: TSH 1.67 07/24/2020: ALT 19; BUN 12; Creatinine, Ser 0.77; Hemoglobin 11.3; Platelets 217; Potassium 4.3; Sodium 141   Recent Lipid Panel Lab Results  Component Value Date/Time   CHOL 191 05/12/2020 02:48 PM   CHOL 226 (H) 05/31/2019 09:52 AM   TRIG 47 05/12/2020 02:48 PM   HDL 72 05/12/2020 02:48 PM   HDL 74 05/31/2019 09:52 AM   CHOLHDL 2.7 05/12/2020 02:48 PM   LDLCALC 105 (H) 05/12/2020 02:48 PM   LDLDIRECT 126.6 07/18/2010 10:45 AM    Wt Readings from Last 3 Encounters:  09/13/20 177 lb (80.3 kg)  07/24/20 179 lb (81.2 kg)  07/05/20 184 lb (83.5 kg)     Risk Assessment/Calculations:      Objective:    Vital Signs:  BP (!) 169/76   Pulse 63   Ht 5\' 2"  (1.575 m)   Wt 177 lb (80.3 kg)   BMI 32.37 kg/m    VITAL SIGNS:  reviewed GEN:  no acute distress EYES:  sclerae anicteric, EOMI - Extraocular Movements Intact RESPIRATORY:  normal respiratory effort, symmetric expansion CARDIOVASCULAR:  no peripheral edema SKIN:  no rash, lesions or ulcers. MUSCULOSKELETAL:  no  obvious deformities. NEURO:  alert and oriented x 3, no obvious focal deficit PSYCH:  normal affect  ASSESSMENT & PLAN:     1. Hypertension Patient reports blood pressure running in normal range most of the times.  This morning it was high which she attributes to high salt diet and excess stress on her life.  Discussed importance of low-sodium diet.  No change in medical therapy.  She will continue to keep log.  2.  Obstructive sleep apnea -Compliant and dentures  3.  Mild aortic stenosis -Follow-up with routine echo periodically  COVID-19 Education: The signs and symptoms of COVID-19 were discussed with the patient and how to seek care for testing (follow up with PCP or arrange E-visit).  The importance of social distancing was discussed today.  Time:   Today, I have spent 10 minutes with the patient with telehealth technology discussing the above problems.     Medication Adjustments/Labs and Tests Ordered: Current medicines are reviewed at length with the patient today.  Concerns regarding medicines are outlined above.   Tests Ordered: No orders of the defined types were placed in this encounter.   Medication Changes: No orders of the defined types were placed in this encounter.   Follow Up:  Virtual Visit  in 6 month(s)  Signed, Leanor Kail, [place]  09/13/2020 9:03 AM    [place]

## 2020-09-13 ENCOUNTER — Telehealth: Payer: Self-pay | Admitting: *Deleted

## 2020-09-13 ENCOUNTER — Ambulatory Visit: Payer: Self-pay | Admitting: Genetic Counselor

## 2020-09-13 ENCOUNTER — Encounter: Payer: Self-pay | Admitting: Genetic Counselor

## 2020-09-13 ENCOUNTER — Other Ambulatory Visit: Payer: Self-pay

## 2020-09-13 ENCOUNTER — Telehealth (INDEPENDENT_AMBULATORY_CARE_PROVIDER_SITE_OTHER): Payer: PPO | Admitting: Physician Assistant

## 2020-09-13 ENCOUNTER — Ambulatory Visit: Payer: PPO | Admitting: Cardiology

## 2020-09-13 ENCOUNTER — Encounter: Payer: Self-pay | Admitting: Physician Assistant

## 2020-09-13 VITALS — BP 169/76 | HR 63 | Ht 62.0 in | Wt 177.0 lb

## 2020-09-13 DIAGNOSIS — I350 Nonrheumatic aortic (valve) stenosis: Secondary | ICD-10-CM | POA: Diagnosis not present

## 2020-09-13 DIAGNOSIS — Z1379 Encounter for other screening for genetic and chromosomal anomalies: Secondary | ICD-10-CM | POA: Insufficient documentation

## 2020-09-13 DIAGNOSIS — J301 Allergic rhinitis due to pollen: Secondary | ICD-10-CM | POA: Diagnosis not present

## 2020-09-13 DIAGNOSIS — I10 Essential (primary) hypertension: Secondary | ICD-10-CM | POA: Diagnosis not present

## 2020-09-13 DIAGNOSIS — Z800 Family history of malignant neoplasm of digestive organs: Secondary | ICD-10-CM

## 2020-09-13 DIAGNOSIS — G4733 Obstructive sleep apnea (adult) (pediatric): Secondary | ICD-10-CM

## 2020-09-13 DIAGNOSIS — J3089 Other allergic rhinitis: Secondary | ICD-10-CM | POA: Diagnosis not present

## 2020-09-13 DIAGNOSIS — Z85820 Personal history of malignant melanoma of skin: Secondary | ICD-10-CM

## 2020-09-13 NOTE — Telephone Encounter (Signed)
°  Patient Consent for Virtual Visit         Brianna Mccarty has provided verbal consent on 09/13/2020 for a virtual visit (video or telephone).   CONSENT FOR VIRTUAL VISIT FOR:  Brianna Mccarty  By participating in this virtual visit I agree to the following:  I hereby voluntarily request, consent and authorize [facility] and its employed or contracted physicians, physician assistants, nurse practitioners or other licensed health care professionals (the Practitioner), to provide me with telemedicine health care services (the Services") as deemed necessary by the treating Practitioner. I acknowledge and consent to receive the Services by the Practitioner via telemedicine. I understand that the telemedicine visit will involve communicating with the Practitioner through live audiovisual communication technology and the disclosure of certain medical information by electronic transmission. I acknowledge that I have been given the opportunity to request an in-person assessment or other available alternative prior to the telemedicine visit and am voluntarily participating in the telemedicine visit.  I understand that I have the right to withhold or withdraw my consent to the use of telemedicine in the course of my care at any time, without affecting my right to future care or treatment, and that the Practitioner or I may terminate the telemedicine visit at any time. I understand that I have the right to inspect all information obtained and/or recorded in the course of the telemedicine visit and may receive copies of available information for a reasonable fee.  I understand that some of the potential risks of receiving the Services via telemedicine include:   Delay or interruption in medical evaluation due to technological equipment failure or disruption;  Information transmitted may not be sufficient (e.g. poor resolution of images) to allow for appropriate medical decision making by the  Practitioner; and/or   In rare instances, security protocols could fail, causing a breach of personal health information.  Furthermore, I acknowledge that it is my responsibility to provide information about my medical history, conditions and care that is complete and accurate to the best of my ability. I acknowledge that Practitioner's advice, recommendations, and/or decision may be based on factors not within their control, such as incomplete or inaccurate data provided by me or distortions of diagnostic images or specimens that may result from electronic transmissions. I understand that the practice of medicine is not an exact science and that Practitioner makes no warranties or guarantees regarding treatment outcomes. I acknowledge that a copy of this consent can be made available to me via my patient portal ([place]), or I can request a printed copy by calling the office of [facility].    I understand that my insurance will be billed for this visit.   I have read or had this consent read to me.  I understand the contents of this consent, which adequately explains the benefits and risks of the Services being provided via telemedicine.   I have been provided ample opportunity to ask questions regarding this consent and the Services and have had my questions answered to my satisfaction.  I give my informed consent for the services to be provided through the use of telemedicine in my medical care

## 2020-09-13 NOTE — Progress Notes (Signed)
HPI:  Brianna Mccarty was previously seen in the [place] clinic due to a family history of cancer and concerns regarding a hereditary predisposition to cancer. Please refer to our prior cancer genetics clinic note for more information regarding our discussion, assessment and recommendations, at the time. Brianna Mccarty's recent genetic test results were disclosed to her, as were recommendations warranted by these results. These results and recommendations are discussed in more detail below.  CANCER HISTORY:  In 2006, at the age of 29, Brianna Mccarty was diagnosed with melanoma on her leg, which was surgically removed.   FAMILY HISTORY:  We obtained a detailed, 4-generation family history.  Significant diagnoses are listed below: Family History  Problem Relation Age of Onset  . Pancreatic cancer Father 27  . Cancer Brother 77       Bile duct  . Colon cancer Paternal Uncle        dx 65s  . Lung cancer Paternal Uncle        dx 34s     Brianna Mccarty has one son, age 17, and one daughter, age 21.  Brianna Mccarty has one brother with bile duct cancer diagnosed at 33 and three other siblings without a cancer history.  Brianna Mccarty's mother passed away at age 35 and did not have cancer.  No maternal family history of cancer was reported.  Brianna Mccarty's father was diagnosed with pancreatic cancer at age 73.  Brianna Mccarty had a paternal uncle with colon cancer diagnosed in his 24s and a paternal uncle diagnosed with lung cancer in his 46s.  Brianna Mccarty also has a cousin with breast cancer diagnosed in her 61s.  No other paternal family history of cancer was reported.   Brianna Mccarty is unaware of previous family history of genetic testing for hereditary cancer risks. Patient's maternal ancestors are of English/Welsh descent, and paternal ancestors are of English/Welsh descent. There is no reported Ashkenazi Jewish ancestry. There is no known consanguinity.  GENETIC TEST RESULTS: Genetic testing reported out on September 11, 2020.  The [facility] Multi-Cancer Panel found no pathogenic mutations. The Multi-Cancer Panel offered by [facility] includes sequencing and/or deletion duplication testing of the following 85 genes: AIP, ALK, APC, ATM, AXIN2,BAP1,  BARD1, BLM, BMPR1A, BRCA1, BRCA2, BRIP1, CASR, CDC73, CDH1, CDK4, CDKN1B, CDKN1C, CDKN2A (p14ARF), CDKN2A (p16INK4a), CEBPA, CHEK2, CTNNA1, DICER1, DIS3L2, EGFR (c.2369C>T, p.Thr790Met variant only), EPCAM (Deletion/duplication testing only), FH, FLCN, GATA2, GPC3, GREM1 (Promoter region deletion/duplication testing only), HOXB13 (c.251G>A, p.Gly84Glu), HRAS, KIT, MAX, MEN1, MET, MITF (c.952G>A, p.Glu318Lys variant only), MLH1, MSH2, MSH3, MSH6, MUTYH, NBN, NF1, NF2, NTHL1, PALB2, PDGFRA, PHOX2B, PMS2, POLD1, POLE, POT1, PRKAR1A, PTCH1, PTEN, RAD50, RAD51C, RAD51D, RB1, RECQL4, RET, RNF43, RUNX1, SDHAF2, SDHA (sequence changes only), SDHB, SDHC, SDHD, SMAD4, SMARCA4, SMARCB1, SMARCE1, STK11, SUFU, TERC, TERT, TMEM127, TP53, TSC1, TSC2, VHL, WRN and WT1.   The test report has been scanned into EPIC and is located under the Molecular Pathology section of the Results Review tab.  A portion of the result report is included below for reference.     We discussed with Brianna Mccarty that because current genetic testing is not perfect, it is possible there may be a gene mutation in one of these genes that current testing cannot detect, but that chance is small.  We also discussed, that there could be another gene that has not yet been discovered, or that we have not yet tested, that is responsible for the cancer diagnoses in the family. It is  also possible there is a hereditary cause for the cancer in the family that Brianna Mccarty did not inherit and therefore was not identified in her testing.  Therefore, it is important to remain in touch with cancer genetics in the future so that we can continue to offer Brianna Mccarty the most up to date genetic testing.   ADDITIONAL GENETIC TESTING: We discussed  with Brianna Mccarty that her genetic testing was fairly extensive.  If there are genes identified to increase cancer risk that can be analyzed in the future, we would be happy to discuss and coordinate this testing at that time.    CANCER SCREENING RECOMMENDATIONS: Brianna Mccarty's test result is considered negative (normal).  This means that we have not identified a hereditary cause for her personal and family history of cancer at this time. Most cancers happen by chance and this negative test suggests that her and her family's cancer may fall into this category.    While reassuring, this does not definitively rule out a hereditary predisposition to cancer. It is still possible that there could be genetic mutations that are undetectable by current technology. There could be genetic mutations in genes that have not been tested or identified to increase cancer risk.  Therefore, it is recommended she continue to follow the cancer management and screening guidelines provided by her primary healthcare provider.   An individual's cancer risk and medical management are not determined by genetic test results alone. Overall cancer risk assessment incorporates additional factors, including personal medical history, family history, and any available genetic information that may result in a personalized plan for cancer prevention and surveillance  RECOMMENDATIONS FOR FAMILY MEMBERS:  Individuals in this family might be at some increased risk of developing cancer, over the general population risk, simply due to the family history of cancer.  We recommended women in this family have a yearly mammogram beginning at age 84, or 30 years younger than the earliest onset of cancer, an annual clinical breast exam, and perform monthly breast self-exams. Women in this family should also have a gynecological exam as recommended by their primary provider. All family members should be referred for colonoscopy starting at age 66.  It is also  possible there is a hereditary cause for the cancer in Brianna Mccarty's family that she did not inherit and therefore was not identified in her.  Based on Brianna Mccarty's family history of pancreatic cancer, we recommended her sister have genetic counseling and testing. Brianna Mccarty will let us know if we can be of any assistance in coordinating genetic counseling and/or testing for this family member.   FOLLOW-UP: Lastly, we discussed with Ms. Spells that cancer genetics is a rapidly advancing field and it is possible that new genetic tests will be appropriate for her and/or her family members in the future. We encouraged her to remain in contact with cancer genetics on an annual basis so we can update her personal and family histories and let her know of advances in cancer genetics that may benefit this family.   Our contact number was provided. Ms. Lunden's questions were answered to her satisfaction, and she knows she is welcome to call us at anytime with additional questions or concerns.   Delane Stalling M. Joette Catching, [place], [place] Film/video [email or web address] (P) [phone]

## 2020-09-13 NOTE — Patient Instructions (Signed)
Medication Instructions:  Your physician recommends that you continue on your current medications as directed. Please refer to the Current Medication list given to you today.  *If you need a refill on your cardiac medications before your next appointment, please call your pharmacy*   Lab Work: None ordered  If you have labs (blood work) drawn today and your tests are completely normal, you will receive your results only by: . MyChart Message (if you have MyChart) OR . A paper copy in the mail If you have any lab test that is abnormal or we need to change your treatment, we will call you to review the results.   Testing/Procedures: None ordered   Follow-Up: At [facility], you and your health needs are our priority.  As part of our continuing mission to provide you with exceptional heart care, we have created designated Provider Care Teams.  These Care Teams include your primary Cardiologist (physician) and Advanced Practice Providers (APPs -  Physician Assistants and Nurse Practitioners) who all work together to provide you with the care you need, when you need it.  We recommend signing up for the patient portal called "MyChart".  Sign up information is provided on this After Visit Summary.  MyChart is used to connect with patients for Virtual Visits (Telemedicine).  Patients are able to view lab/test results, encounter notes, upcoming appointments, etc.  Non-urgent messages can be sent to your provider as well.   To learn more about what you can do with MyChart, go to [email or web address].    Your next appointment:   6 month(s)  The format for your next appointment:   In Person  Provider:   You may see Katarina Nelson, MD or one of the following Advanced Practice Providers on your designated Care Team:    Dayna Dunn, PA-C  Michele Lenze, PA-C    Other Instructions   

## 2020-09-19 ENCOUNTER — Other Ambulatory Visit: Payer: Self-pay | Admitting: Cardiology

## 2020-09-19 ENCOUNTER — Other Ambulatory Visit: Payer: Self-pay | Admitting: Family Medicine

## 2020-09-20 DIAGNOSIS — H40013 Open angle with borderline findings, low risk, bilateral: Secondary | ICD-10-CM | POA: Diagnosis not present

## 2020-09-20 DIAGNOSIS — Z961 Presence of intraocular lens: Secondary | ICD-10-CM | POA: Diagnosis not present

## 2020-09-20 DIAGNOSIS — J301 Allergic rhinitis due to pollen: Secondary | ICD-10-CM | POA: Diagnosis not present

## 2020-09-20 DIAGNOSIS — H524 Presbyopia: Secondary | ICD-10-CM | POA: Diagnosis not present

## 2020-09-20 DIAGNOSIS — J3089 Other allergic rhinitis: Secondary | ICD-10-CM | POA: Diagnosis not present

## 2020-09-20 DIAGNOSIS — H04123 Dry eye syndrome of bilateral lacrimal glands: Secondary | ICD-10-CM | POA: Diagnosis not present

## 2020-10-02 ENCOUNTER — Other Ambulatory Visit: Payer: Self-pay | Admitting: Family Medicine

## 2020-10-05 DIAGNOSIS — J3089 Other allergic rhinitis: Secondary | ICD-10-CM | POA: Diagnosis not present

## 2020-10-05 DIAGNOSIS — J301 Allergic rhinitis due to pollen: Secondary | ICD-10-CM | POA: Diagnosis not present

## 2020-10-18 DIAGNOSIS — J3089 Other allergic rhinitis: Secondary | ICD-10-CM | POA: Diagnosis not present

## 2020-10-18 DIAGNOSIS — J301 Allergic rhinitis due to pollen: Secondary | ICD-10-CM | POA: Diagnosis not present

## 2020-10-23 DIAGNOSIS — J3089 Other allergic rhinitis: Secondary | ICD-10-CM | POA: Diagnosis not present

## 2020-10-23 DIAGNOSIS — J301 Allergic rhinitis due to pollen: Secondary | ICD-10-CM | POA: Diagnosis not present

## 2020-11-01 DIAGNOSIS — J3089 Other allergic rhinitis: Secondary | ICD-10-CM | POA: Diagnosis not present

## 2020-11-01 DIAGNOSIS — J301 Allergic rhinitis due to pollen: Secondary | ICD-10-CM | POA: Diagnosis not present

## 2020-11-09 DIAGNOSIS — J301 Allergic rhinitis due to pollen: Secondary | ICD-10-CM | POA: Diagnosis not present

## 2020-11-09 DIAGNOSIS — J3089 Other allergic rhinitis: Secondary | ICD-10-CM | POA: Diagnosis not present

## 2020-11-14 DIAGNOSIS — J3089 Other allergic rhinitis: Secondary | ICD-10-CM | POA: Diagnosis not present

## 2020-11-14 DIAGNOSIS — J301 Allergic rhinitis due to pollen: Secondary | ICD-10-CM | POA: Diagnosis not present

## 2020-11-29 DIAGNOSIS — J3089 Other allergic rhinitis: Secondary | ICD-10-CM | POA: Diagnosis not present

## 2020-11-29 DIAGNOSIS — J301 Allergic rhinitis due to pollen: Secondary | ICD-10-CM | POA: Diagnosis not present

## 2020-12-06 DIAGNOSIS — J301 Allergic rhinitis due to pollen: Secondary | ICD-10-CM | POA: Diagnosis not present

## 2020-12-06 DIAGNOSIS — J3089 Other allergic rhinitis: Secondary | ICD-10-CM | POA: Diagnosis not present

## 2020-12-13 ENCOUNTER — Telehealth: Payer: Self-pay | Admitting: Family Medicine

## 2020-12-13 NOTE — Telephone Encounter (Signed)
Left message for patient to call back and schedule Medicare Annual Wellness Visit (AWV) either virtually or in office.   Last AWV 12/13/19  please schedule at anytime with [facility] Nurse Health Advisor 1 or 2   This should be a 45 minute visit. 

## 2020-12-20 DIAGNOSIS — J301 Allergic rhinitis due to pollen: Secondary | ICD-10-CM | POA: Diagnosis not present

## 2020-12-20 DIAGNOSIS — J3089 Other allergic rhinitis: Secondary | ICD-10-CM | POA: Diagnosis not present

## 2020-12-25 DIAGNOSIS — J301 Allergic rhinitis due to pollen: Secondary | ICD-10-CM | POA: Diagnosis not present

## 2020-12-25 DIAGNOSIS — J3089 Other allergic rhinitis: Secondary | ICD-10-CM | POA: Diagnosis not present

## 2020-12-27 DIAGNOSIS — M25551 Pain in right hip: Secondary | ICD-10-CM | POA: Diagnosis not present

## 2020-12-27 DIAGNOSIS — M5459 Other low back pain: Secondary | ICD-10-CM | POA: Diagnosis not present

## 2021-01-01 ENCOUNTER — Other Ambulatory Visit: Payer: Self-pay | Admitting: Family Medicine

## 2021-01-01 DIAGNOSIS — H6123 Impacted cerumen, bilateral: Secondary | ICD-10-CM | POA: Diagnosis not present

## 2021-01-11 DIAGNOSIS — J301 Allergic rhinitis due to pollen: Secondary | ICD-10-CM | POA: Diagnosis not present

## 2021-01-17 DIAGNOSIS — J301 Allergic rhinitis due to pollen: Secondary | ICD-10-CM | POA: Diagnosis not present

## 2021-01-17 DIAGNOSIS — J3089 Other allergic rhinitis: Secondary | ICD-10-CM | POA: Diagnosis not present

## 2021-01-19 DIAGNOSIS — M25551 Pain in right hip: Secondary | ICD-10-CM | POA: Diagnosis not present

## 2021-01-22 ENCOUNTER — Encounter: Payer: Self-pay | Admitting: Nurse Practitioner

## 2021-01-22 ENCOUNTER — Ambulatory Visit: Payer: PPO | Admitting: Nurse Practitioner

## 2021-01-22 ENCOUNTER — Other Ambulatory Visit: Payer: Self-pay

## 2021-01-22 VITALS — BP 130/70 | Ht 61.0 in | Wt 180.0 lb

## 2021-01-22 DIAGNOSIS — L900 Lichen sclerosus et atrophicus: Secondary | ICD-10-CM

## 2021-01-22 DIAGNOSIS — J3089 Other allergic rhinitis: Secondary | ICD-10-CM | POA: Diagnosis not present

## 2021-01-22 DIAGNOSIS — J301 Allergic rhinitis due to pollen: Secondary | ICD-10-CM | POA: Diagnosis not present

## 2021-01-22 DIAGNOSIS — M810 Age-related osteoporosis without current pathological fracture: Secondary | ICD-10-CM | POA: Diagnosis not present

## 2021-01-22 DIAGNOSIS — Z01419 Encounter for gynecological examination (general) (routine) without abnormal findings: Secondary | ICD-10-CM

## 2021-01-22 NOTE — Patient Instructions (Signed)
Health Maintenance After Age 81 After age 81, you are at a higher risk for certain long-term diseases and infections as well as injuries from falls. Falls are a major cause of broken bones and head injuries in people who are older than age 81. Getting regular preventive care can help to keep you healthy and well. Preventive care includes getting regular testing and making lifestyle changes as recommended by your health care provider. Talk with your health care provider about:  Which screenings and tests you should have. A screening is a test that checks for a disease when you have no symptoms.  A diet and exercise plan that is right for you. What should I know about screenings and tests to prevent falls? Screening and testing are the best ways to find a health problem early. Early diagnosis and treatment give you the best chance of managing medical conditions that are common after age 81. Certain conditions and lifestyle choices may make you more likely to have a fall. Your health care provider may recommend:  Regular vision checks. Poor vision and conditions such as cataracts can make you more likely to have a fall. If you wear glasses, make sure to get your prescription updated if your vision changes.  Medicine review. Work with your health care provider to regularly review all of the medicines you are taking, including over-the-counter medicines. Ask your health care provider about any side effects that may make you more likely to have a fall. Tell your health care provider if any medicines that you take make you feel dizzy or sleepy.  Osteoporosis screening. Osteoporosis is a condition that causes the bones to get weaker. This can make the bones weak and cause them to break more easily.  Blood pressure screening. Blood pressure changes and medicines to control blood pressure can make you feel dizzy.  Strength and balance checks. Your health care provider may recommend certain tests to check your  strength and balance while standing, walking, or changing positions.  Foot health exam. Foot pain and numbness, as well as not wearing proper footwear, can make you more likely to have a fall.  Depression screening. You may be more likely to have a fall if you have a fear of falling, feel emotionally low, or feel unable to do activities that you used to do.  Alcohol use screening. Using too much alcohol can affect your balance and may make you more likely to have a fall. What actions can I take to lower my risk of falls? General instructions  Talk with your health care provider about your risks for falling. Tell your health care provider if: ? You fall. Be sure to tell your health care provider about all falls, even ones that seem minor. ? You feel dizzy, sleepy, or off-balance.  Take over-the-counter and prescription medicines only as told by your health care provider. These include any supplements.  Eat a healthy diet and maintain a healthy weight. A healthy diet includes low-fat dairy products, low-fat (lean) meats, and fiber from whole grains, beans, and lots of fruits and vegetables. Home safety  Remove any tripping hazards, such as rugs, cords, and clutter.  Install safety equipment such as grab bars in bathrooms and safety rails on stairs.  Keep rooms and walkways well-lit. Activity  Follow a regular exercise program to stay fit. This will help you maintain your balance. Ask your health care provider what types of exercise are appropriate for you.  If you need a cane or walker,   use it as recommended by your health care provider.  Wear supportive shoes that have nonskid soles.   Lifestyle  Do not drink alcohol if your health care provider tells you not to drink.  If you drink alcohol, limit how much you have: ? 0-1 drink a day for women. ? 0-2 drinks a day for men.  Be aware of how much alcohol is in your drink. In the [place] one drink equals one typical bottle of beer (12  oz), one-half glass of wine (5 oz), or one shot of hard liquor (1 oz).  Do not use any products that contain nicotine or tobacco, such as cigarettes and e-cigarettes. If you need help quitting, ask your health care provider. Summary  Having a healthy lifestyle and getting preventive care can help to protect your health and wellness after age 81.  Screening and testing are the best way to find a health problem early and help you avoid having a fall. Early diagnosis and treatment give you the best chance for managing medical conditions that are more common for people who are older than age 81.  Falls are a major cause of broken bones and head injuries in people who are older than age 81. Take precautions to prevent a fall at home.  Work with your health care provider to learn what changes you can make to improve your health and wellness and to prevent falls. This information is not intended to replace advice given to you by your health care provider. Make sure you discuss any questions you have with your health care provider. Document Revised: 02/11/2019 Document Reviewed: 09/03/2017 Elsevier Patient Education  2021 Elsevier Inc.  

## 2021-01-22 NOTE — Progress Notes (Signed)
   Brianna Mccarty 1941/09/24 [id number]   History:  80 y.o. G2P2002 presents for breast and pelvic exam without GYN complaints. Postmenopausal - no HRT, no bleeding. Cryosurgery for abnormal pap >40 years ago, subsequent paps normal. Lichen Sclerosus managed well with Clobetasol as needed. Normal mammogram history. Osteoporosis managed by PCP, was restarted of Fosamax July 2021.  Gynecologic History No LMP recorded. Patient is postmenopausal.   Contraception: post menopausal status Last Pap: No longer screening per guidelines Last mammogram: 01/2020. Results were: normal Last colonoscopy: 2016 Last Dexa: 05/2020. Results were: t-score -2.0  Past medical history, past surgical history, family history and social history were all reviewed and documented in the EPIC chart.  ROS:  A ROS was performed and pertinent positives and negatives are included.  Exam:  Vitals:   01/22/21 1332  BP: 130/70  Weight: 180 lb (81.6 kg)  Height: 5\' 1"  (1.549 m)   Body mass index is 34.01 kg/m.  General appearance:  Normal Thyroid:  Symmetrical, normal in size, without palpable masses or nodularity. Respiratory  Auscultation:  Clear without wheezing or rhonchi Cardiovascular  Auscultation:  Regular rate, without rubs, murmurs or gallops  Edema/varicosities:  Not grossly evident Abdominal  Soft,nontender, without masses, guarding or rebound.  Liver/spleen:  No organomegaly noted  Hernia:  None appreciated  Skin  Inspection:  Grossly normal   Breasts: Examined lying and sitting.   Right: Without masses, retractions, discharge or axillary adenopathy.   Left: Without masses, retractions, discharge or axillary adenopathy. Gentitourinary   Inguinal/mons:  Normal without inguinal adenopathy  External genitalia:  Normal atrophic appearing vulva. Pearly white/blanking skin at posterior introitus consistent with LC  BUS/Urethra/Skene's glands:  Normal  Vagina:  Atrophic changes  Cervix:   Normal  Uterus:  Normal in size, shape and contour.  Midline and mobile  Adnexa/parametria:     Rt: Without masses or tenderness.   Lt: Without masses or tenderness.  Anus and perineum: Normal  Digital rectal exam: Normal sphincter tone without palpated masses or tenderness  Assessment/Plan:  Brianna y.o. [id number] for breast and pelvic exam.   Well female exam with routine gynecological exam - Education provided on SBEs, importance of preventative screenings, current guidelines, high calcium diet, regular exercise, and multivitamin daily. Labs with PCP.   Lichen sclerosus - managed well with Clobetasol as needed. No refill needed today.   Osteoporosis, unspecified osteoporosis type, unspecified pathological fracture presence - PCP manages. Restarted on Fosamax July 2021. Most recent T-score -2.0 in July 2021.  Screening for cervical cancer - Cryosurgery >40 years ago, subsequent paps normal. No longer screening per guidelines.   Screening for breast cancer - Normal mammogram history.  Continue annual screenings.  Normal breast exam today.  Screening for colon cancer - 2016 colonoscopy. Will repeat at GI's recommended interval.   Return in 1 year for annual.   [name] DNP, 1:40 PM 01/22/2021

## 2021-01-29 ENCOUNTER — Other Ambulatory Visit: Payer: Self-pay

## 2021-01-29 ENCOUNTER — Encounter (INDEPENDENT_AMBULATORY_CARE_PROVIDER_SITE_OTHER): Payer: PPO | Admitting: Ophthalmology

## 2021-01-29 DIAGNOSIS — D3131 Benign neoplasm of right choroid: Secondary | ICD-10-CM

## 2021-01-29 DIAGNOSIS — H33303 Unspecified retinal break, bilateral: Secondary | ICD-10-CM | POA: Diagnosis not present

## 2021-01-29 DIAGNOSIS — I10 Essential (primary) hypertension: Secondary | ICD-10-CM | POA: Diagnosis not present

## 2021-01-29 DIAGNOSIS — H43813 Vitreous degeneration, bilateral: Secondary | ICD-10-CM | POA: Diagnosis not present

## 2021-01-29 DIAGNOSIS — H35033 Hypertensive retinopathy, bilateral: Secondary | ICD-10-CM | POA: Diagnosis not present

## 2021-01-29 DIAGNOSIS — H353122 Nonexudative age-related macular degeneration, left eye, intermediate dry stage: Secondary | ICD-10-CM

## 2021-01-29 DIAGNOSIS — D3132 Benign neoplasm of left choroid: Secondary | ICD-10-CM | POA: Diagnosis not present

## 2021-01-29 DIAGNOSIS — H35372 Puckering of macula, left eye: Secondary | ICD-10-CM | POA: Diagnosis not present

## 2021-01-29 DIAGNOSIS — H35341 Macular cyst, hole, or pseudohole, right eye: Secondary | ICD-10-CM

## 2021-02-02 DIAGNOSIS — Z03818 Encounter for observation for suspected exposure to other biological agents ruled out: Secondary | ICD-10-CM | POA: Diagnosis not present

## 2021-02-02 DIAGNOSIS — Z20822 Contact with and (suspected) exposure to covid-19: Secondary | ICD-10-CM | POA: Diagnosis not present

## 2021-02-03 ENCOUNTER — Ambulatory Visit
Admission: EM | Admit: 2021-02-03 | Discharge: 2021-02-03 | Disposition: A | Discharge status: Home / Self Care | Payer: PPO | Attending: Family Medicine | Admitting: Family Medicine

## 2021-02-03 ENCOUNTER — Other Ambulatory Visit: Payer: Self-pay

## 2021-02-03 ENCOUNTER — Encounter: Payer: Self-pay | Admitting: Emergency Medicine

## 2021-02-03 DIAGNOSIS — J0100 Acute maxillary sinusitis, unspecified: Secondary | ICD-10-CM | POA: Diagnosis not present

## 2021-02-03 MED ORDER — DOXYCYCLINE HYCLATE 100 MG PO CAPS: 100.0000 mg | ORAL_CAPSULE | Freq: Two times a day (BID) | ORAL | 0 refills | Status: DC

## 2021-02-03 MED ORDER — AZELASTINE HCL 0.1 % NA SOLN
NASAL | 0 refills | Status: AC
Start: 2021-02-03 — End: ?

## 2021-02-03 NOTE — ED Provider Notes (Signed)
[facility]    CSN: [id number] Arrival date & time: 02/03/21  1101      History   Chief Complaint Chief Complaint  Patient presents with  . Cough  . Fever    HPI Brianna Mccarty is a 80 y.o. female.   HPI  Nasal congestion x 4 days. Cough which is mostly non-productive.  Fever 101.3 MAX x 2 days. Endorses recent use of rescue inhaler since outdoor allergens have been active.  She denies any overt shortness of breath or wheezing.  She has had a negative Covid test.  She has been taking OTC medication for symptoms without relief.  Past Medical History:  Diagnosis Date  . Abnormal EKG    Normal LV function in the past  . Allergic rhinitis   . Asthma   . Bronchitis, mucopurulent recurrent ([place])   . Carotid artery disease (H[facility])    a. mild by carotid duplex.  . Cervical dysplasia 1971  . Diverticulosis of colon   . DJD (degenerative joint disease)   . Family history of cancer of extrahepatic bile ducts 09/05/2020  . Family history of colon cancer 09/05/2020  . Family history of pancreatic cancer 09/05/2020  . GERD (gastroesophageal reflux disease)   . Headache(784.0)   . Hypercholesterolemia   . Hypertension   . Interstitial cystitis    sees urologist  . Lichen sclerosus   . Malignant melanoma ([place]) 2006   sees Dr. Nevada Crane in dermatology  . Migraines   . Mild aortic stenosis   . Mitral valve disease    Question mitral valve prolapse in the past, no prolapse by echo 2009  . Murmur 10/20/2015   SEES DR Meda Coffee  . OSA (obstructive sleep apnea)    USES DENTAL DEVICE  . Osteoporosis    on fosomax > 5 years, stopped 11/2015  . Renal calculus    sees urologist  . Thyroid cyst    1 x 1.1 thyroid cyst noted on carotid Doppler, January, 2012  . Venous insufficiency     Patient Active Problem List   Diagnosis Date Noted  . Genetic testing 09/13/2020  . Family history of pancreatic cancer 09/05/2020  . Family history of colon cancer 09/05/2020  . Family history  of cancer of extrahepatic bile ducts 09/05/2020  . Fever 07/26/2020  . History of COVID-19 07/26/2020  . History of melanoma 05/12/2020  . Macular degeneration 02/17/2019  . OSA (obstructive sleep apnea) 02/12/2018  . Hyperglycemia 02/12/2018  . H/O cold sores 11/27/2015  . Murmur 10/20/2015  . Carotid artery disease ([place])   . Thyroid cyst   . Venous (peripheral) insufficiency 06/02/2009  . Asthma 04/27/2008  . Melanoma of skin ([place]) 02/10/2008  . Allergic rhinitis 02/10/2008  . INTERSTITIAL CYSTITIS 02/10/2008  . HYPERCHOLESTEROLEMIA 02/09/2008  . Essential hypertension 02/09/2008  . GERD 02/09/2008  . Osteoporosis 02/09/2008    Past Surgical History:  Procedure Laterality Date  . BREAST BIOPSY Left 11/25/2011   U/S core, benign performed at [facility], LEFT BREAT MARKER IN  . BREAST CYST ASPIRATION    . BREAST SURGERY  2013   Breast Bx-Benign  . CARDIAC CATHETERIZATION    . CATARACT EXTRACTION, BILATERAL    . CHOLECYSTECTOMY N/A 12/22/2019   Procedure: LAPAROSCOPIC CHOLECYSTECTOMY;  Surgeon: Ileana Roup, MD;  Location: [facility];  Service: General;  Laterality: N/A;  . cystoscopy and basket stone removal right ureter  02/2006   Dr. Terance Hart  . GALLBLADDER SURGERY  12/22/2019  .  GYNECOLOGIC CRYOSURGERY  1971  . left total hip replacement  2004   Dr. Gladstone Lighter  . melanoma removed from medial rleft knee area  2006   Dr. Nevada Crane  . NASAL SEPTUM SURGERY      OB History    Gravida  2   Para  2   Term  2   Preterm      AB      Living  2     SAB      IAB      Ectopic      Multiple      Live Births               Home Medications    Prior to Admission medications   Medication Sig Start Date End Date Taking? Authorizing Provider  albuterol (PROAIR HFA) 108 (90 Base) MCG/ACT inhaler Inhale 2 puffs into the lungs every 6 (six) hours as needed for wheezing. 09/10/18   Noralee Space, MD  albuterol (PROVENTIL) (2.5 MG/3ML) 0.083%  nebulizer solution Take 3 mLs (2.5 mg total) by nebulization every 6 (six) hours as needed. 09/10/18 07/09/29  Noralee Space, MD  alendronate (FOSAMAX) 70 MG tablet Take 1 tablet (70 mg total) by mouth every 7 (seven) days. Take with a full glass of water on an empty stomach. 05/25/20   Koberlein, Steele Berg, MD  amLODipine (NORVASC) 10 MG tablet Take 1 tablet (10 mg total) by mouth daily. 07/12/20   Caren Macadam, MD  Ascorbic Acid (VITAMIN C) 500 MG tablet Take 500 mg by mouth daily.    [provider]  aspirin 81 MG tablet Take 81 mg by mouth daily.    [provider]  aspirin-acetaminophen-caffeine (EXCEDRIN MIGRAINE) [phone] MG tablet Take by mouth every 6 (six) hours as needed for headache.    [provider]  azelastine (ASTELIN) 0.1 % nasal spray Instill 2 sprays into each nostril two times a day as needed for allergies 11/19/16   [provider]  Bempedoic Acid (NEXLETOL) 180 MG TABS Take 1 tablet by mouth daily. 06/08/20   Dorothy Spark, MD  budesonide-formoterol [facility]) 160-4.5 MCG/ACT inhaler Inhale 2 puffs 2 (two) times daily into the lungs. 09/10/17   Noralee Space, MD  calcium carbonate (OSCAL) 1500 (600 Ca) MG TABS tablet Take 1 tablet by mouth daily.    [provider]  Cholecalciferol (VITAMIN D3) 5000 UNITS CAPS Take 5,000 Units by mouth daily.    [provider]  Cinnamon 500 MG TABS Take 1 tablet by mouth daily.     [provider]  clobetasol (TEMOVATE) 0.05 % external solution Apply topically. 06/08/20   [provider]  Coenzyme Q10 (CO Q-10) 200 MG CAPS Take 200 mg by mouth daily. 05/25/18   Sueanne Margarita, MD  desmopressin (DDAVP) 0.2 MG tablet Take 0.6 mg by mouth at bedtime.     [provider]  EPINEPHrine 0.3 mg/0.3 mL IJ SOAJ injection See admin instructions. 12/21/18   [provider]  fexofenadine (ALLEGRA) 180 MG tablet Take 180 mg by mouth daily.    [provider]  furosemide (LASIX) 20 MG tablet TAKE 1 TABLET BY MOUTH EVERY DAY 09/19/20   Bhagat, Bhavinkumar, PA  hydrALAZINE (APRESOLINE) 50 MG tablet TAKE 1 TABLET BY MOUTH 3 TIMES A DAY AND TAKE EXTRA TABLET IF SBP>160 06/09/20   Dorothy Spark, MD  hydrOXYzine (ATARAX/VISTARIL) 10 MG tablet Take 10 mg by mouth at bedtime.  [provider]  losartan (COZAAR) 100 MG tablet Take 1 tablet (100 mg total) by mouth daily. 07/12/20   Caren Macadam, MD  magnesium oxide (MAG-OX) 400 MG tablet Take 400 mg by mouth daily.    [provider]  Multiple Vitamins-Minerals (ICAPS AREDS 2 PO) Take by mouth daily.    [provider]  omeprazole (PRILOSEC) 40 MG capsule TAKE 1 CAPSULE BY MOUTH EVERY DAY. 07/12/20   Caren Macadam, MD  pentosan polysulfate (ELMIRON) 100 MG capsule Take 100 mg by mouth 3 (three) times daily before meals.    [provider]  prazosin (MINIPRESS) 5 MG capsule *NEED OFFICE VISIT* TAKE 1 CAPSULE BY MOUTH TWICE A DAY 01/02/21   Koberlein, Steele Berg, MD  Probiotic Product (PROBIOTIC PO) Take 1 capsule by mouth daily.     [provider]  UNABLE TO FIND Med Name: JUICE PLUS TAKES 6 CAPS PER DAY    [provider]  UNABLE TO FIND Med Name: NOPOLEA 2 OUNCES PER DAY    [provider]  UNABLE TO FIND Med Name: [place] 2 PER DAY    [provider]  valACYclovir (VALTREX) 1000 MG tablet Take 1,000 mg by mouth as needed (FEVER BLISTER).  07/28/19   [provider]    Family History Family History  Problem Relation Age of Onset  . Pancreatic cancer Father 20  . Diabetes Mother   . Heart disease Mother   . Cancer Brother 66       Bile duct  . Diabetes Brother   . Hypertension Brother   . Diabetes Brother   . Heart disease Brother   . Hypertension Brother   . Hypertension Sister   . Hypertension Sister   . Colon cancer Paternal Uncle        dx 66s  . Lung cancer Paternal Uncle        dx 38s     Social History Social History   Tobacco Use  . Smoking status: Never Smoker  . Smokeless tobacco: Never Used  Vaping Use  . Vaping Use: Never used  Substance Use Topics  . Alcohol use: Yes    Alcohol/week: 1.0 standard drink    Types: 1 Standard drinks or equivalent per week    Comment:  OCC WINE  . Drug use: No     Allergies   Cephalexin, Hydrochlorothiazide, Penicillins, Phenobarbital, Rosuvastatin, and Tape   Review of Systems Review of Systems Pertinent negatives listed in HPI   Physical Exam Triage Vital Signs ED Triage Vitals [02/03/21 1154]  Enc Vitals Group     BP 109/67     Pulse Rate 86     Resp 18     Temp 98.4 F (36.9 C)     Temp Source Oral     SpO2 95 %     Weight      Height      Head Circumference      Peak Flow      Pain Score      Pain Loc      Pain Edu?      Excl. in [place]?    No data found.  Updated Vital Signs BP 109/67 (BP Location: Left Arm)   Pulse 86   Temp 98.4 F (36.9 C) (Oral)   Resp 18   SpO2 95%   Visual Acuity Right Eye Distance:   Left Eye Distance:   Bilateral Distance:    Right Eye  Near:   Left Eye Near:    Bilateral Near:     Physical Exam  General Appearance:    Alert, cooperative, no distress  HENT:   Normocephalic, ears normal, nares mucosal edema with congestion, rhinorrhea, oropharynx clear   Eyes:    PERRL, conjunctiva/corneas clear, EOM's intact       Lungs:     Clear to auscultation bilaterally, respirations unlabored  Heart:    Regular rate and rhythm  Neurologic:   Awake, alert, oriented x 3. No apparent focal neurological           defect.      UC Treatments / Results  Labs (all labs ordered are listed, but only abnormal results are displayed) Labs Reviewed - No data to display  EKG   Radiology No results found.  Procedures Procedures (including critical care time)  Medications Ordered in UC Medications - No data to display  Initial Impression / Assessment and Plan / UC  Course  I have reviewed the triage vital signs and the nursing notes.  Pertinent labs & imaging results that were available during my care of the patient were reviewed by me and considered in my medical decision making (see chart for details).    Acute nonrecurrent maxillary sinusitis.  Treating with doxycycline 100 mg twice daily for total of 10 days.  Refill patient's Astelin nasal spray 2 times daily as needed for allergic rhinitis symptoms.  Follow-up precautions discussed indicating need to follow-up with PCP and if symptoms become severe such as shortness of breath or severe wheezing, go immediately to the ER. Final Clinical Impressions(s) / UC Diagnoses   Final diagnoses:  Acute non-recurrent maxillary sinusitis   Discharge Instructions   None    ED Prescriptions    Medication Sig Dispense Auth. Provider   doxycycline (VIBRAMYCIN) 100 MG capsule Take 1 capsule (100 mg total) by mouth 2 (two) times daily. 20 capsule Scot Jun, FNP   azelastine (ASTELIN) 0.1 % nasal spray Instill 2 sprays into each nostril two times a day as needed for allergies 30 mL Scot Jun, FNP     PDMP not reviewed this encounter.   Scot Jun, FNP 02/03/21 1527

## 2021-02-03 NOTE — ED Triage Notes (Signed)
Pt here for fever starting last night with nasal congestion and some cough; pt had negative covid test

## 2021-02-12 DIAGNOSIS — M25551 Pain in right hip: Secondary | ICD-10-CM | POA: Diagnosis not present

## 2021-02-13 DIAGNOSIS — J3089 Other allergic rhinitis: Secondary | ICD-10-CM | POA: Diagnosis not present

## 2021-02-13 DIAGNOSIS — J301 Allergic rhinitis due to pollen: Secondary | ICD-10-CM | POA: Diagnosis not present

## 2021-02-14 ENCOUNTER — Encounter: Payer: Self-pay | Admitting: Nurse Practitioner

## 2021-02-14 DIAGNOSIS — Z1231 Encounter for screening mammogram for malignant neoplasm of breast: Secondary | ICD-10-CM | POA: Diagnosis not present

## 2021-02-15 ENCOUNTER — Telehealth: Payer: Self-pay | Admitting: Family Medicine

## 2021-02-15 NOTE — Telephone Encounter (Signed)
Left message for patient to call back and schedule Medicare Annual Wellness Visit (AWV) either virtually or in office.   Last AWV 12/13/19  please schedule at anytime with [facility] Nurse Health Advisor 1 or 2   This should be a 45 minute visit.

## 2021-02-16 DIAGNOSIS — M25551 Pain in right hip: Secondary | ICD-10-CM | POA: Diagnosis not present

## 2021-02-20 DIAGNOSIS — J3089 Other allergic rhinitis: Secondary | ICD-10-CM | POA: Diagnosis not present

## 2021-02-20 DIAGNOSIS — J301 Allergic rhinitis due to pollen: Secondary | ICD-10-CM | POA: Diagnosis not present

## 2021-02-28 DIAGNOSIS — J3089 Other allergic rhinitis: Secondary | ICD-10-CM | POA: Diagnosis not present

## 2021-02-28 DIAGNOSIS — J301 Allergic rhinitis due to pollen: Secondary | ICD-10-CM | POA: Diagnosis not present

## 2021-03-07 DIAGNOSIS — J3089 Other allergic rhinitis: Secondary | ICD-10-CM | POA: Diagnosis not present

## 2021-03-07 DIAGNOSIS — J301 Allergic rhinitis due to pollen: Secondary | ICD-10-CM | POA: Diagnosis not present

## 2021-03-11 NOTE — Progress Notes (Signed)
Cardiology Office Note:    Date:  03/14/2021   ID:  Brianna Mccarty, DOB [birth year], MRN [id number]  PCP:  Caren Macadam, MD   [facility] Providers Cardiologist:  Ena Dawley, MD (Inactive) {   Referring MD: Caren Macadam, MD    History of Present Illness:    Brianna Mccarty is a 80 y.o. female with a hx of HTN, HLD, OSA and mild AS who was previously followed by Dr. Meda Coffee who now returns to clinic for follow-up.  Today, the patient feels well. No chest pain, SOB, palpitations, lightheadedness, orthopnea or dizziness. Has significant right hip and may be planned for hip surgery over the summer. Patient's mobility is significantly limited due to arthritic pain and she is not able to walk as much as she used to.  Blood pressure is well controlled at home. No heart failure or anginal symptoms.   Last TTE 06/2018 showed normal LVEF and grade 1 DD. Mild AS and left atrium is severely dilated.   Past Medical History:  Diagnosis Date  . Abnormal EKG    Normal LV function in the past  . Allergic rhinitis   . Asthma   . Bronchitis, mucopurulent recurrent ([place])   . Carotid artery disease (HCC)    a. mild by carotid duplex.  . Cervical dysplasia 1971  . Diverticulosis of colon   . DJD (degenerative joint disease)   . Family history of cancer of extrahepatic bile ducts 09/05/2020  . Family history of colon cancer 09/05/2020  . Family history of pancreatic cancer 09/05/2020  . GERD (gastroesophageal reflux disease)   . Headache(784.0)   . Hypercholesterolemia   . Hypertension   . Interstitial cystitis    sees urologist  . Lichen sclerosus   . Malignant melanoma ([place]) 2006   sees Dr. [place] Crane in dermatology  . Migraines   . Mild aortic stenosis   . Mitral valve disease    Question mitral valve prolapse in the past, no prolapse by echo 2009  . Murmur 10/20/2015   SEES DR Meda Coffee  . OSA (obstructive sleep apnea)    USES DENTAL DEVICE  . Osteoporosis    on fosomax >  5 years, stopped 11/2015  . Renal calculus    sees urologist  . Thyroid cyst    1 x 1.1 thyroid cyst noted on carotid Doppler, January, 2012  . Venous insufficiency     Past Surgical History:  Procedure Laterality Date  . BREAST BIOPSY Left 11/25/2011   U/S core, benign performed at [facility], LEFT BREAT MARKER IN  . BREAST CYST ASPIRATION    . BREAST SURGERY  2013   Breast Bx-Benign  . CARDIAC CATHETERIZATION    . CATARACT EXTRACTION, BILATERAL    . CHOLECYSTECTOMY N/A 12/22/2019   Procedure: LAPAROSCOPIC CHOLECYSTECTOMY;  Surgeon: Ileana Roup, MD;  Location: [facility];  Service: General;  Laterality: N/A;  . cystoscopy and basket stone removal right ureter  02/2006   Dr. Terance Hart  . GALLBLADDER SURGERY  12/22/2019  . GYNECOLOGIC CRYOSURGERY  1971  . left total hip replacement  2004   Dr. Gladstone Lighter  . melanoma removed from medial rleft knee area  2006   Dr. [place] Crane  . NASAL SEPTUM SURGERY      Current Medications: Current Meds  Medication Sig  . albuterol (PROAIR HFA) 108 (90 Base) MCG/ACT inhaler Inhale 2 puffs into the lungs every 6 (six) hours as needed for wheezing.  Marland Kitchen albuterol (PROVENTIL) (2.5 MG/3ML)  0.083% nebulizer solution Take 3 mLs (2.5 mg total) by nebulization every 6 (six) hours as needed.  Marland Kitchen alendronate (FOSAMAX) 70 MG tablet Take 1 tablet (70 mg total) by mouth every 7 (seven) days. Take with a full glass of water on an empty stomach.  Marland Kitchen amLODipine (NORVASC) 10 MG tablet Take 1 tablet (10 mg total) by mouth daily.  . Ascorbic Acid (VITAMIN C) 500 MG tablet Take 500 mg by mouth daily.  Marland Kitchen aspirin 81 MG tablet Take 81 mg by mouth daily.  . Aspirin-Acetaminophen-Caffeine (EXCEDRIN PO) Take by mouth as needed.  Marland Kitchen azelastine (ASTELIN) 0.1 % nasal spray Instill 2 sprays into each nostril two times a day as needed for allergies  . Bempedoic Acid (NEXLETOL) 180 MG TABS Take 1 tablet by mouth daily.  . budesonide-formoterol (SYMBICORT) 160-4.5 MCG/ACT  inhaler Inhale 2 puffs 2 (two) times daily into the lungs.  . calcium carbonate (OSCAL) 1500 (600 Ca) MG TABS tablet Take 1 tablet by mouth daily.  . Cholecalciferol (VITAMIN D3) 5000 UNITS CAPS Take 5,000 Units by mouth daily.  . Cinnamon 500 MG TABS Take 1 tablet by mouth daily.   . clobetasol (TEMOVATE) 0.05 % external solution Apply topically.  . Coenzyme Q10 (CO Q-10) 200 MG CAPS Take 200 mg by mouth daily.  Marland Kitchen desmopressin (DDAVP) 0.2 MG tablet Take 0.6 mg by mouth at bedtime.   Marland Kitchen EPINEPHrine 0.3 mg/0.3 mL IJ SOAJ injection See admin instructions.  . fexofenadine (ALLEGRA) 180 MG tablet Take 180 mg by mouth daily.  . furosemide (LASIX) 20 MG tablet Take 20 mg by mouth as needed.  . hydrALAZINE (APRESOLINE) 50 MG tablet TAKE 1 TAB 3 TIMES A DAY AND TAKE EXTRA TAB IF SBP>160  . hydrOXYzine (ATARAX/VISTARIL) 10 MG tablet Take 10 mg by mouth at bedtime.   Marland Kitchen losartan (COZAAR) 100 MG tablet Take 1 tablet (100 mg total) by mouth daily.  . magnesium oxide (MAG-OX) 400 MG tablet Take 400 mg by mouth daily.  . Multiple Vitamins-Minerals (ICAPS AREDS 2 PO) Take by mouth daily.  Marland Kitchen omeprazole (PRILOSEC) 40 MG capsule TAKE 1 CAPSULE BY MOUTH EVERY DAY.  Marland Kitchen pentosan polysulfate (ELMIRON) 100 MG capsule Take 100 mg by mouth daily.  . prazosin (MINIPRESS) 5 MG capsule *NEED OFFICE VISIT* TAKE 1 CAPSULE BY MOUTH TWICE A DAY  . Probiotic Product (PROBIOTIC PO) Take 1 capsule by mouth daily.   Marland Kitchen UNABLE TO FIND Med Name: JUICE PLUS TAKES 6 CAPS PER DAY  . UNABLE TO FIND Med Name: NOPOLEA 2 OUNCES PER DAY  . UNABLE TO FIND Med Name: JUICE PLUS OMEGA 2 PER DAY  . valACYclovir (VALTREX) 1000 MG tablet Take 1,000 mg by mouth as needed (FEVER BLISTER).      Allergies:   Celebrex [celecoxib], Cephalexin, Hydrochlorothiazide, Penicillins, Phenobarbital, Rosuvastatin, and Tape   Social History   Socioeconomic History  . Marital status: Married    Spouse name: Edison Nasuti  . Number of children: 2  . Years of  education: Not on file  . Highest education level: Not on file  Occupational History  . Occupation: retired    Fish farm manager: RETIRED  Tobacco Use  . Smoking status: Never Smoker  . Smokeless tobacco: Never Used  Vaping Use  . Vaping Use: Never used  Substance and Sexual Activity  . Alcohol use: Yes    Alcohol/week: 1.0 standard drink    Types: 1 Standard drinks or equivalent per week    Comment:  OCC WINE  .  Drug use: No  . Sexual activity: Not Currently    Birth control/protection: Post-menopausal    Comment: 1st intercourse 6 yo-1 partner  Other Topics Concern  . Not on file  Social History Narrative   Updated 12/13/2019   Work or School: none      Home Situation: lives with husband      Spiritual Beliefs: christian      Lifestyle: regular exercise; healthy diet      12/13/2019: Uses treadmill 3/x weekly.    Looks after sister who has been having memory decline, as well  as husband with minor memory decline.    Enjoys Occupational hygienist, reading, going to church, travel   Social Determinants of Radio broadcast assistant Strain: Not on file  Food Insecurity: Not on file  Transportation Needs: Not on file  Physical Activity: Not on file  Stress: Not on file  Social Connections: Not on file     Family History: The patient's family history includes Cancer (age of onset: 59) in her brother; Colon cancer in her paternal uncle; Diabetes in her brother, brother, and mother; Heart disease in her brother and mother; Hypertension in her brother, brother, sister, and sister; Lung cancer in her paternal uncle; Pancreatic cancer (age of onset: 6) in her father.  ROS:   Please see the history of present illness.    Review of Systems  Constitutional: Negative for chills and fever.  HENT: Negative for hearing loss.   Eyes: Negative for blurred vision.  Respiratory: Negative for shortness of breath.   Cardiovascular: Negative for chest pain, palpitations, orthopnea, claudication, leg  swelling and PND.  Gastrointestinal: Negative for melena, nausea and vomiting.  Genitourinary: Negative for dysuria.  Musculoskeletal: Positive for back pain, joint pain and myalgias.  Neurological: Negative for dizziness and loss of consciousness.  Psychiatric/Behavioral: Negative for substance abuse.    EKGs/Labs/Other Studies Reviewed:    The following studies were reviewed today: [facility] 06/2018 Study Conclusions - Left ventricle: The cavity size was normal. Wall thickness was  normal. Systolic function was normal. The estimated ejection  fraction was in the range of 55% to 60%. Wall motion was normal;  there were no regional wall motion abnormalities. Doppler  parameters are consistent with abnormal left ventricular  relaxation (grade 1 diastolic dysfunction). Doppler parameters  are consistent with elevated mean left atrial filling pressure.  - Aortic valve: There was mild stenosis.  - Mitral valve: Severely calcified annulus. Mildly thickened  leaflets .  - Left atrium: The atrium was severely dilated.  - Atrial septum: No defect or patent foramen ovale was identified.   EKG:  EKG is ordered today.  The ekg ordered today demonstrates NSR with HR65  Recent Labs: 05/12/2020: TSH 1.67 07/24/2020: ALT 19; BUN 12; Creatinine, Ser 0.77; Hemoglobin 11.3; Platelets 217; Potassium 4.3; Sodium 141  Recent Lipid Panel    Component Value Date/Time   CHOL 191 05/12/2020 1448   CHOL 226 (H) 05/31/2019 0952   TRIG 47 05/12/2020 1448   HDL 72 05/12/2020 1448   HDL 74 05/31/2019 0952   CHOLHDL 2.7 05/12/2020 1448   VLDL 7.4 08/10/2018 0949   LDLCALC 105 (H) 05/12/2020 1448   LDLDIRECT 126.6 07/18/2010 1045      Physical Exam:    VS:  BP 132/78   Pulse 65   Ht 5\' 1"  (1.549 m)   Wt 175 lb 6.4 oz (79.6 kg)   SpO2 98%   BMI 33.14 kg/m  Wt Readings from Last 3 Encounters:  03/14/21 175 lb 6.4 oz (79.6 kg)  01/22/21 180 lb (81.6 kg)  09/13/20 177 lb (80.3 kg)      GEN:  Well nourished, well developed in no acute distress HEENT: Normal NECK: No JVD; No carotid bruits CARDIAC: RRR, no murmurs, rubs, gallops RESPIRATORY:  Clear to auscultation without rales, wheezing or rhonchi  ABDOMEN: Soft, non-tender, non-distended MUSCULOSKELETAL:  No edema; No deformity  SKIN: Warm and dry NEUROLOGIC:  Alert and oriented x 3 PSYCHIATRIC:  Normal affect   ASSESSMENT:    1. Mild aortic stenosis   2. Essential hypertension   3. OSA (obstructive sleep apnea)   4. Hyperlipidemia, unspecified hyperlipidemia type   5. Right hip pain    PLAN:    In order of problems listed above:  #HTN: Well controlled -Continue amlodipine 10mg  daily -Continue hydralazine 50mg  TID -Continue losartan 100mg  daily -Continue lasix 20mg  daily  #OSA: -Has dentures which has helped  #Mild AS: Last TTE with AVA 1.5cm2, mean gradient 26mmHg. Harsh systolic murmur on examination today.  -Repeat TTE for monitoring   #HLD: Intolerant of statins. Wants to avoid injections if possible. -Continue Nexletol 180mg  daily  #Right Hip Pain: Patient with mobility limiting right hip pain currently being seen by orthopedic surgery. May be planned for possible hip replacement over the summer.  -Follow-up with ortho as scheduled -If needed, can obtain myoview prior to surgery for CV clearance -Will obtain TTE as above   Medication Adjustments/Labs and Tests Ordered: Current medicines are reviewed at length with the patient today.  Concerns regarding medicines are outlined above.  Orders Placed This Encounter  Procedures  . EKG 12-Lead  . ECHOCARDIOGRAM COMPLETE   No orders of the defined types were placed in this encounter.   Patient Instructions  Medication Instructions:   Your physician recommends that you continue on your current medications as directed. Please refer to the Current Medication list given to you today.   *If you need a refill on your cardiac medications  before your next appointment, please call your pharmacy*   Testing/Procedures:  Your physician has requested that you have an echocardiogram. Echocardiography is a painless test that uses sound waves to create images of your heart. It provides your doctor with information about the size and shape of your heart and how well your heart's chambers and valves are working. This procedure takes approximately one hour. There are no restrictions for this procedure.   Follow-Up: At [facility], you and your health needs are our priority.  As part of our continuing mission to provide you with exceptional heart care, we have created designated Provider Care Teams.  These Care Teams include your primary Cardiologist (physician) and Advanced Practice Providers (APPs -  Physician Assistants and Nurse Practitioners) who all work together to provide you with the care you need, when you need it.  We recommend signing up for the patient portal called "MyChart".  Sign up information is provided on this After Visit Summary.  MyChart is used to connect with patients for Virtual Visits (Telemedicine).  Patients are able to view lab/test results, encounter notes, upcoming appointments, etc.  Non-urgent messages can be sent to your provider as well.   To learn more about what you can do with MyChart, go to [email or web address].    Your next appointment:   6 month(s)  The format for your next appointment:   In Person  Provider:   You will see one of the following  Advanced Practice Providers on your designated Care Team:    Richardson Dopp, PA-C  Robbie Lis, [place]       Signed, Freada Bergeron, MD  03/14/2021 3:20 PM    [place]

## 2021-03-12 ENCOUNTER — Other Ambulatory Visit: Payer: Self-pay | Admitting: Cardiovascular Disease

## 2021-03-12 ENCOUNTER — Other Ambulatory Visit: Payer: Self-pay | Admitting: Family Medicine

## 2021-03-13 IMAGING — MR MR ABDOMEN WO/W CM
11 of 18 series · 24 of 48 positions shown · IV contrast (multihance)
Comparison: MRI 08/24/2019, 11/20/2018

CLINICAL DATA: Follow-up pancreatic cysts

EXAM:
MRI ABDOMEN WITHOUT AND WITH CONTRAST
TECHNIQUE: Multiplanar multisequence MR imaging of the abdomen was performed
both before and after the administration of intravenous contrast.
CONTRAST:  17mL MULTIHANCE GADOBENATE DIMEGLUMINE 529 MG/ML IV SOLN

[Series 2: T2 · coronal · 5.0mm · 1.48mm/px · 1 of 25 slices shown (1 of 3)]
[im 1/25]
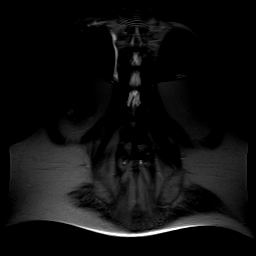

[Series 3: T2 · axial · 5.0mm · 1.37mm/px · 1 of 34 slices shown (2 of 3)]
[im 1/34]
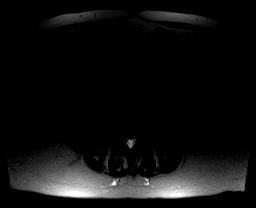

[Series 4: axial in out · axial · 5.5mm · 0.74mm/px · 1 of 68 slices shown]
[im 1/68]
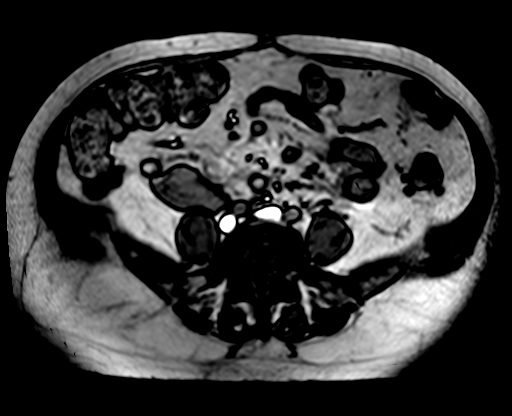

[Series 5: MRCP fat-sat · coronal · 3.0mm · 0.70mm/px · 1 of 22 slices shown]
[im 1/22]
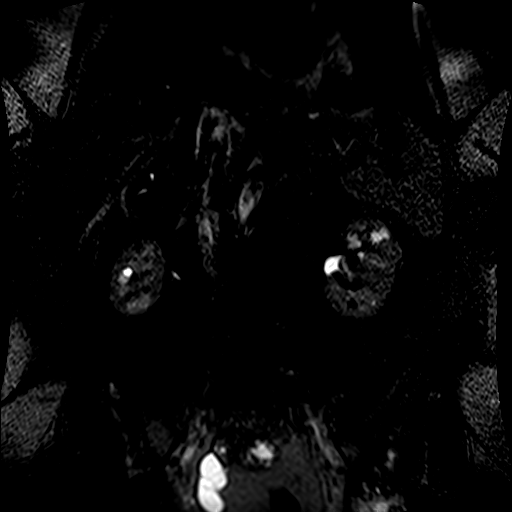

[Series 6: T2 · axial · 5.0mm · 0.74mm/px · 1 of 39 slices shown (3 of 3)]
[im 1/39]
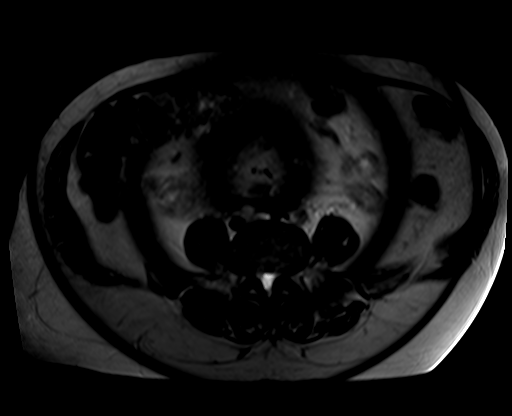

[Series 7: ep2d_[id number]_[id number]_trig · axial · 5.0mm · 1.98mm/px · z∈[-34,+203]mm · 3 of 117 slices shown]
[im 1/117]
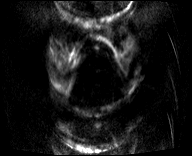
[im 59/117]
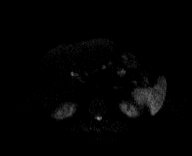
[im 117/117]
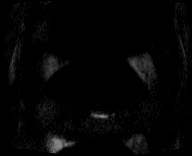

[Series 8: [id number]_trig_adc · axial · 5.0mm · 1.98mm/px · 1 of 39 slices shown]
[im 1/39]
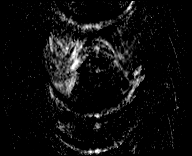

[Series 11: T1 dynamic · axial · non-contrast · 2.0mm · 0.78mm/px · z∈[-51,+171]mm · 3 of 112 slices shown]
[im 1/112]
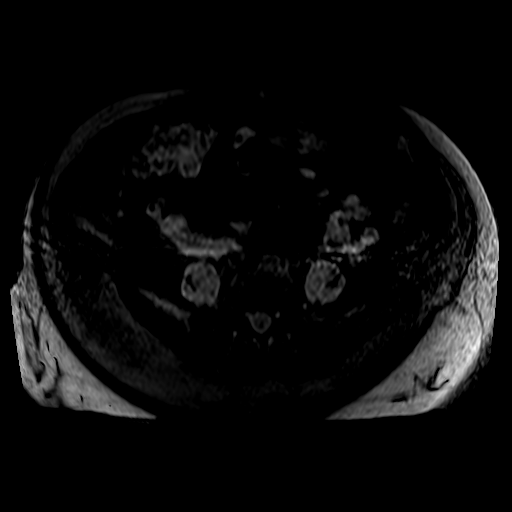
[im 56/112]
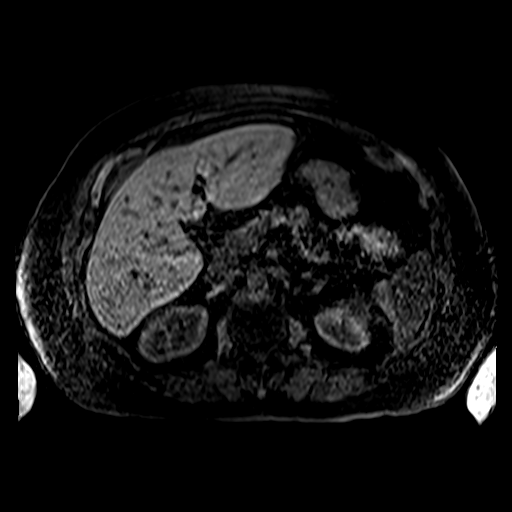
[im 112/112]
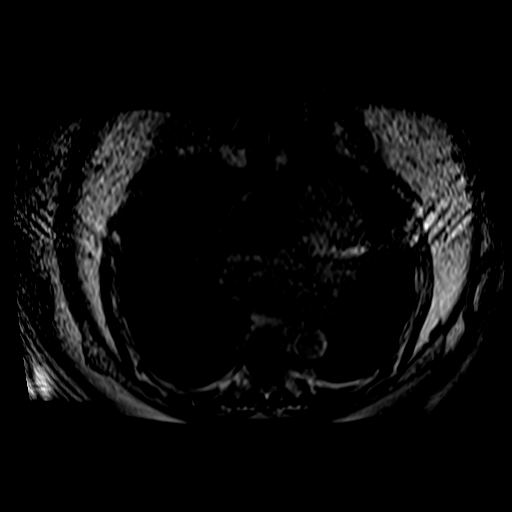

[Series 12: post 25 sec · axial · 2.0mm · 0.78mm/px · z∈[-51,+171]mm · 4 of 112 slices shown]
[im 1/112]
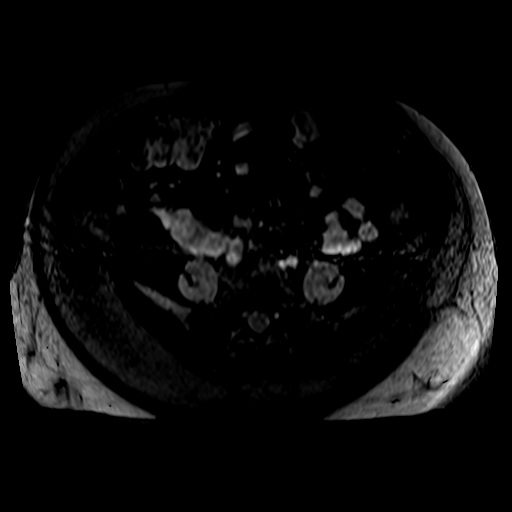
[im 38/112]
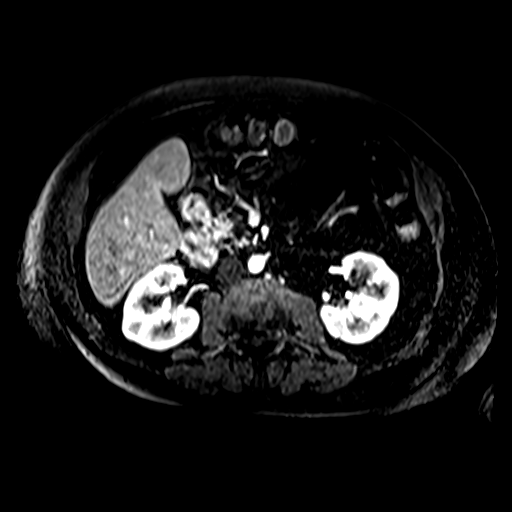
[im 75/112]
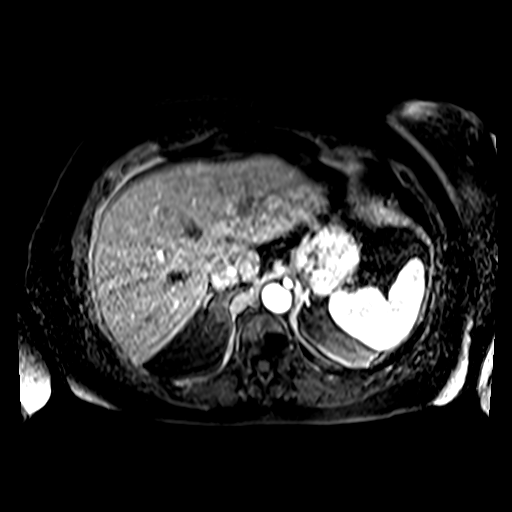
[im 112/112]
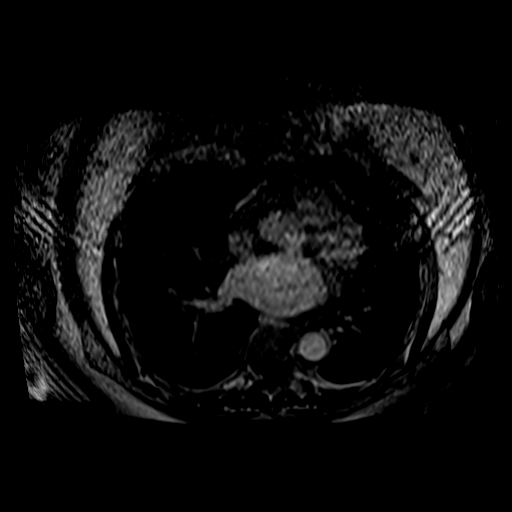

[Series 13: post 25 sec_sub · axial · 2.0mm · 0.78mm/px · z∈[-51,+171]mm · 4 of 112 slices shown]
[im 1/112]
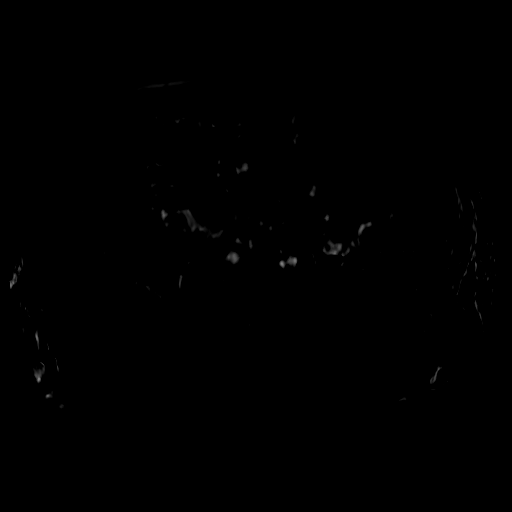
[im 38/112]
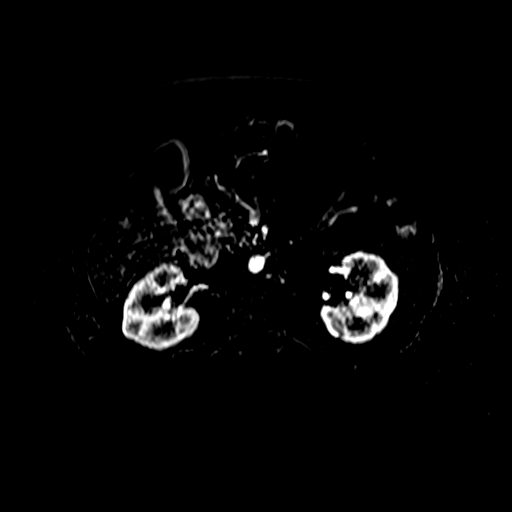
[im 75/112]
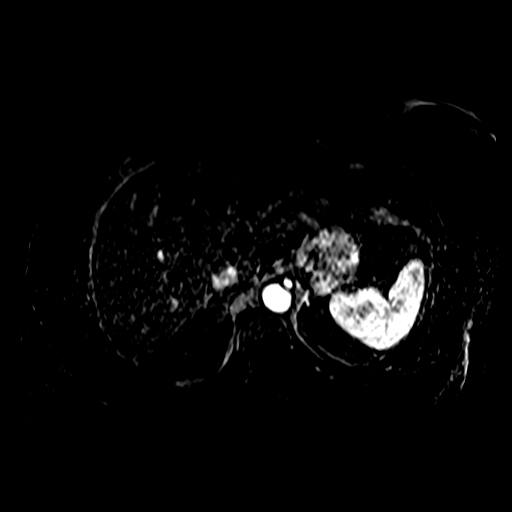
[im 112/112]
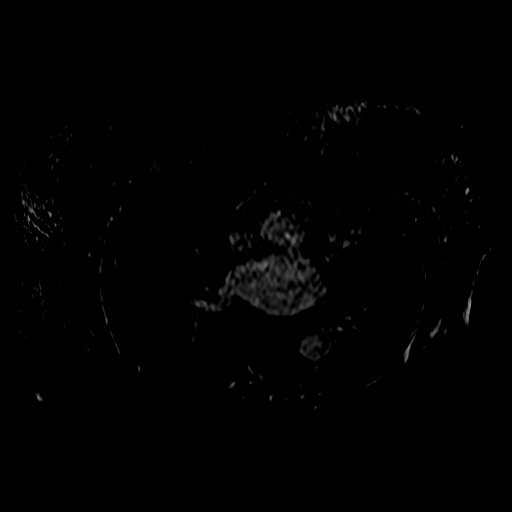

[Series 14: post 45 sec · axial · 2.0mm · 0.78mm/px · z∈[-51,+171]mm · 4 of 112 slices shown]
[im 1/112]
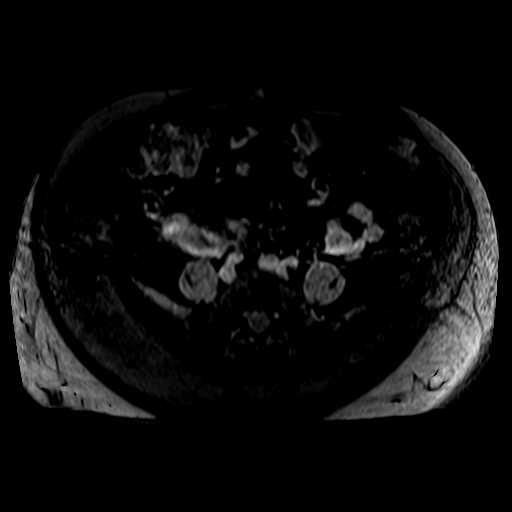
[im 38/112]
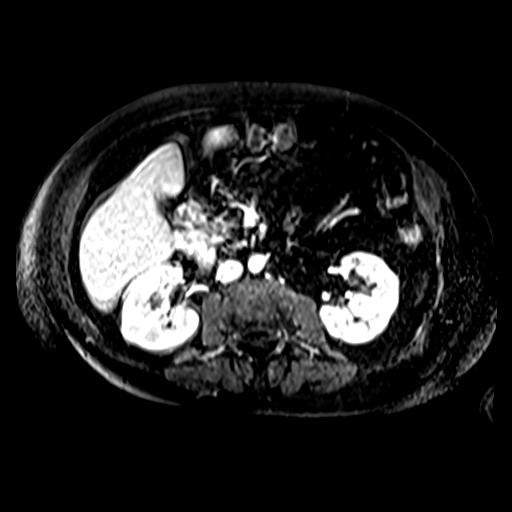
[im 75/112]
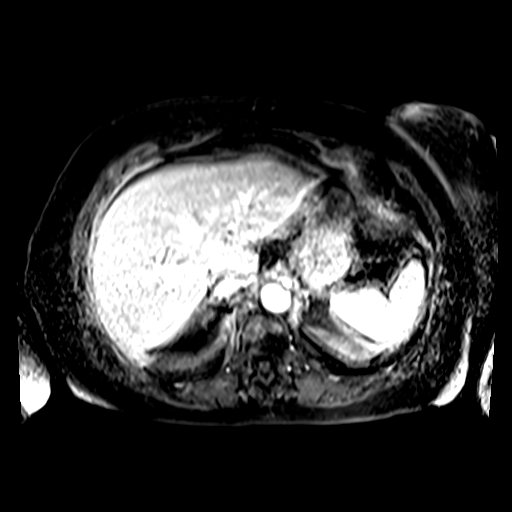
[im 112/112]
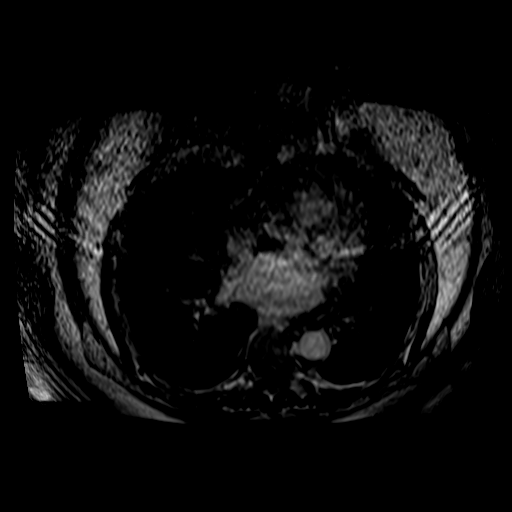

[24 of 48 positions shown; findings below may reference images not displayed]

FINDINGS: Lower chest:  Lung bases are clear.

Hepatobiliary: No focal hepatic lesion. Postcholecystectomy. Mild
prominence of the extrahepatic bile ducts similar prior. No filling
defect bile

Pancreas: No pancreatic duct dilatation. Again demonstrated multiple
nonenhancing cysts of the pancreas. The cysts are hyperintense on T2
weighted imaging without internal complexity or nodularity. Largest
cyst towards the tail the pancreas measures 17 mm (image [DATE]) and
is unchanged from prior. There are multiple additional smaller cysts
scattered through the body and tail. There is no communication with
the pancreatic duct.

Second example cyst towards the uncinate measures 12 mm (image 21)
also unchanged from 12 mm

Spleen: Normal spleen.

Adrenals/urinary tract: Adrenal glands normal. There multiple
nonenhancing cysts within LEFT and RIGHT kidney

Stomach/Bowel: Stomach and limited of the small bowel is
unremarkable

Vascular/Lymphatic: Abdominal aortic normal caliber. No
retroperitoneal periportal lymphadenopathy.

Musculoskeletal: No aggressive osseous lesion
IMPRESSION: 1. Multiple the cysts scattered throughout the body and of tail the
pancreas without worrisome features most suggestive benign post
inflammatory cystic change of the pancreas. No interval change from
MRI 11/20/2018. As patient is now 79 years old, recommend 1
additional follow-up MRI in 24 months from current scan. This
recommendation follows ACR consensus guidelines: Management of
Incidental Pancreatic Cysts: A White Paper of the ACR Incidental
Findings Committee. [HOSPITAL] 1866;[DATE].
2. Benign cysts of the kidneys (Bosniak 1).

## 2021-03-14 ENCOUNTER — Ambulatory Visit: Payer: PPO | Admitting: Cardiology

## 2021-03-14 ENCOUNTER — Encounter: Payer: Self-pay | Admitting: Cardiology

## 2021-03-14 ENCOUNTER — Other Ambulatory Visit: Payer: Self-pay

## 2021-03-14 VITALS — BP 132/78 | HR 65 | Ht 61.0 in | Wt 175.4 lb

## 2021-03-14 DIAGNOSIS — M25551 Pain in right hip: Secondary | ICD-10-CM | POA: Diagnosis not present

## 2021-03-14 DIAGNOSIS — I10 Essential (primary) hypertension: Secondary | ICD-10-CM

## 2021-03-14 DIAGNOSIS — G4733 Obstructive sleep apnea (adult) (pediatric): Secondary | ICD-10-CM

## 2021-03-14 DIAGNOSIS — I350 Nonrheumatic aortic (valve) stenosis: Secondary | ICD-10-CM | POA: Diagnosis not present

## 2021-03-14 DIAGNOSIS — E785 Hyperlipidemia, unspecified: Secondary | ICD-10-CM

## 2021-03-14 NOTE — Patient Instructions (Signed)
Medication Instructions:   Your physician recommends that you continue on your current medications as directed. Please refer to the Current Medication list given to you today.   *If you need a refill on your cardiac medications before your next appointment, please call your pharmacy*   Testing/Procedures:  Your physician has requested that you have an echocardiogram. Echocardiography is a painless test that uses sound waves to create images of your heart. It provides your doctor with information about the size and shape of your heart and how well your heart's chambers and valves are working. This procedure takes approximately one hour. There are no restrictions for this procedure.   Follow-Up: At [facility], you and your health needs are our priority.  As part of our continuing mission to provide you with exceptional heart care, we have created designated Provider Care Teams.  These Care Teams include your primary Cardiologist (physician) and Advanced Practice Providers (APPs -  Physician Assistants and Nurse Practitioners) who all work together to provide you with the care you need, when you need it.  We recommend signing up for the patient portal called "MyChart".  Sign up information is provided on this After Visit Summary.  MyChart is used to connect with patients for Virtual Visits (Telemedicine).  Patients are able to view lab/test results, encounter notes, upcoming appointments, etc.  Non-urgent messages can be sent to your provider as well.   To learn more about what you can do with MyChart, go to [email or web address].    Your next appointment:   6 month(s)  The format for your next appointment:   In Person  Provider:   You will see one of the following Advanced Practice Providers on your designated Care Team:    Richardson Dopp, PA-C  [place], [place]

## 2021-03-22 DIAGNOSIS — J301 Allergic rhinitis due to pollen: Secondary | ICD-10-CM | POA: Diagnosis not present

## 2021-03-22 DIAGNOSIS — J3089 Other allergic rhinitis: Secondary | ICD-10-CM | POA: Diagnosis not present

## 2021-03-27 ENCOUNTER — Other Ambulatory Visit: Payer: Self-pay

## 2021-03-27 ENCOUNTER — Telehealth (INDEPENDENT_AMBULATORY_CARE_PROVIDER_SITE_OTHER): Payer: PPO

## 2021-03-27 DIAGNOSIS — Z0000 Encounter for general adult medical examination without abnormal findings: Secondary | ICD-10-CM

## 2021-03-27 NOTE — Progress Notes (Signed)
Virtual Visit via Telephone Note  I connected with  SHAQUANDA GRAVES on 03/27/21 at  1:00 PM EDT by telephone and verified that I am speaking with the correct person using two identifiers.  Location: Patient: Home Provider: Office Persons participating in the virtual visit: patient/Nurse Health Advisor   I discussed the limitations, risks, security and privacy concerns of performing an evaluation and management service by telephone and the availability of in person appointments. The patient expressed understanding and agreed to proceed.  Interactive audio and video telecommunications were attempted between this nurse and patient, however failed, due to patient having technical difficulties OR patient did not have access to video capability.  We continued and completed visit with audio only.  Some vital signs may be absent or patient reported.   Willette Brace, LPN    Subjective:   KERI VEALE is a 80 y.o. female who presents for Medicare Annual (Subsequent) preventive examination.  Review of Systems     Cardiac Risk Factors include: advanced age (>61men, >37 women);hypertension;obesity (BMI >30kg/m2)     Objective:    There were no vitals filed for this visit. There is no height or weight on file to calculate BMI.  Advanced Directives 03/27/2021 12/22/2019 12/13/2019 12/08/2018 10/22/2017 05/24/2017 05/24/2017  Does Patient Have a Medical Advance Directive? Yes Yes Yes Yes Yes - Yes  Type of Advance Directive [place];Living will - - [place];Living will  Does patient want to make changes to medical advance directive? - No - Patient declined No - Patient declined No - Patient declined - - No - Patient declined  Copy of [place] in Chart? Yes - validated most recent copy scanned in chart (See row information) - - No - copy requested - No - copy requested No - copy requested  Would patient  like information on creating a medical advance directive? - - - - - - -    Current Medications (verified) Outpatient Encounter Medications as of 03/27/2021  Medication Sig  . albuterol (PROAIR HFA) 108 (90 Base) MCG/ACT inhaler Inhale 2 puffs into the lungs every 6 (six) hours as needed for wheezing.  Marland Kitchen albuterol (PROVENTIL) (2.5 MG/3ML) 0.083% nebulizer solution Take 3 mLs (2.5 mg total) by nebulization every 6 (six) hours as needed.  Marland Kitchen alendronate (FOSAMAX) 70 MG tablet Take 1 tablet (70 mg total) by mouth every 7 (seven) days. Take with a full glass of water on an empty stomach.  Marland Kitchen amLODipine (NORVASC) 10 MG tablet Take 1 tablet (10 mg total) by mouth daily.  . Ascorbic Acid (VITAMIN C) 500 MG tablet Take 500 mg by mouth daily.  Marland Kitchen aspirin 81 MG tablet Take 81 mg by mouth daily.  . Aspirin-Acetaminophen-Caffeine (EXCEDRIN PO) Take by mouth as needed.  Marland Kitchen azelastine (ASTELIN) 0.1 % nasal spray Instill 2 sprays into each nostril two times a day as needed for allergies  . Bempedoic Acid (NEXLETOL) 180 MG TABS Take 1 tablet by mouth daily.  . budesonide-formoterol (SYMBICORT) 160-4.5 MCG/ACT inhaler Inhale 2 puffs 2 (two) times daily into the lungs.  . calcium carbonate (OSCAL) 1500 (600 Ca) MG TABS tablet Take 1 tablet by mouth daily.  . Cholecalciferol (VITAMIN D3) 5000 UNITS CAPS Take 5,000 Units by mouth daily.  . Cinnamon 500 MG TABS Take 1 tablet by mouth daily.   . clobetasol (TEMOVATE) 0.05 % external solution Apply topically.  . Coenzyme Q10 (CO Q-10) 200  MG CAPS Take 200 mg by mouth daily.  Marland Kitchen desmopressin (DDAVP) 0.2 MG tablet Take 0.6 mg by mouth at bedtime.   Marland Kitchen EPINEPHrine 0.3 mg/0.3 mL IJ SOAJ injection See admin instructions.  . fexofenadine (ALLEGRA) 180 MG tablet Take 180 mg by mouth daily.  . furosemide (LASIX) 20 MG tablet Take 20 mg by mouth as needed.  . hydrALAZINE (APRESOLINE) 50 MG tablet TAKE 1 TAB 3 TIMES A DAY AND TAKE EXTRA TAB IF SBP>160  . hydrOXYzine  (ATARAX/VISTARIL) 10 MG tablet Take 10 mg by mouth at bedtime.   Marland Kitchen losartan (COZAAR) 100 MG tablet Take 1 tablet (100 mg total) by mouth daily.  . magnesium oxide (MAG-OX) 400 MG tablet Take 400 mg by mouth daily.  . Multiple Vitamins-Minerals (ICAPS AREDS 2 PO) Take by mouth daily.  Marland Kitchen omeprazole (PRILOSEC) 40 MG capsule TAKE 1 CAPSULE BY MOUTH EVERY DAY.  Marland Kitchen pentosan polysulfate (ELMIRON) 100 MG capsule Take 100 mg by mouth daily.  . prazosin (MINIPRESS) 5 MG capsule *NEED OFFICE VISIT* TAKE 1 CAPSULE BY MOUTH TWICE A DAY  . Probiotic Product (PROBIOTIC PO) Take 1 capsule by mouth daily.   Marland Kitchen UNABLE TO FIND Med Name: JUICE PLUS TAKES 6 CAPS PER DAY  . UNABLE TO FIND Med Name: NOPOLEA 2 OUNCES PER DAY  . UNABLE TO FIND Med Name: JUICE PLUS OMEGA 2 PER DAY  . valACYclovir (VALTREX) 1000 MG tablet Take 1,000 mg by mouth as needed (FEVER BLISTER).    No facility-administered encounter medications on file as of 03/27/2021.    Allergies (verified) Celebrex [celecoxib], Cephalexin, Hydrochlorothiazide, Penicillins, Phenobarbital, Rosuvastatin, and Tape   History: Past Medical History:  Diagnosis Date  . Abnormal EKG    Normal LV function in the past  . Allergic rhinitis   . Asthma   . Bronchitis, mucopurulent recurrent ([place])   . Carotid artery disease ([facility])    a. mild by carotid duplex.  . Cervical dysplasia 1971  . Diverticulosis of colon   . DJD (degenerative joint disease)   . Family history of cancer of extrahepatic bile ducts 09/05/2020  . Family history of colon cancer 09/05/2020  . Family history of pancreatic cancer 09/05/2020  . GERD (gastroesophageal reflux disease)   . Headache(784.0)   . Hypercholesterolemia   . Hypertension   . Interstitial cystitis    sees urologist  . Lichen sclerosus   . Malignant melanoma ([place]) 2006   sees Dr. [place] Crane in dermatology  . Migraines   . Mild aortic stenosis   . Mitral valve disease    Question mitral valve prolapse in the past, no  prolapse by echo 2009  . Murmur 10/20/2015   SEES DR Meda Coffee  . OSA (obstructive sleep apnea)    USES DENTAL DEVICE  . Osteoporosis    on fosomax > 5 years, stopped 11/2015  . Renal calculus    sees urologist  . Thyroid cyst    1 x 1.1 thyroid cyst noted on carotid Doppler, January, 2012  . Venous insufficiency    Past Surgical History:  Procedure Laterality Date  . BREAST BIOPSY Left 11/25/2011   U/S core, benign performed [facility], LEFT BREAT MARKER IN  . BREAST CYST ASPIRATION    . BREAST SURGERY  2013   Breast Bx-Benign  . CARDIAC CATHETERIZATION    . CATARACT EXTRACTION, BILATERAL    . CHOLECYSTECTOMY N/A 12/22/2019   Procedure: LAPAROSCOPIC CHOLECYSTECTOMY;  Surgeon: Ileana Roup, MD;  Location: [facility];  Service: General;  Laterality: N/A;  . cystoscopy and basket stone removal right ureter  02/2006   Dr. Terance Hart  . GALLBLADDER SURGERY  12/22/2019  . GYNECOLOGIC CRYOSURGERY  1971  . left total hip replacement  2004   Dr. Gladstone Lighter  . melanoma removed from medial rleft knee area  2006   Dr. Nevada Crane  . NASAL SEPTUM SURGERY     Family History  Problem Relation Age of Onset  . Pancreatic cancer Father 54  . Diabetes Mother   . Heart disease Mother   . Cancer Brother 74       Bile duct  . Diabetes Brother   . Hypertension Brother   . Diabetes Brother   . Heart disease Brother   . Hypertension Brother   . Hypertension Sister   . Hypertension Sister   . Colon cancer Paternal Uncle        dx 79s  . Lung cancer Paternal Uncle        dx 25s   Social History   Socioeconomic History  . Marital status: Married    Spouse name: Edison Nasuti  . Number of children: 2  . Years of education: Not on file  . Highest education level: Not on file  Occupational History  . Occupation: retired    Fish farm manager: RETIRED  Tobacco Use  . Smoking status: Never Smoker  . Smokeless tobacco: Never Used  Vaping Use  . Vaping Use: Never used  Substance and Sexual  Activity  . Alcohol use: Yes    Alcohol/week: 1.0 standard drink    Types: 1 Standard drinks or equivalent per week    Comment:  OCC WINE  . Drug use: No  . Sexual activity: Not Currently    Birth control/protection: Post-menopausal    Comment: 1st intercourse 17 yo-1 partner  Other Topics Concern  . Not on file  Social History Narrative   Updated 12/13/2019   Work or School: none      Home Situation: lives with husband      Spiritual Beliefs: christian      Lifestyle: regular exercise; healthy diet      12/13/2019: Uses treadmill 3/x weekly.    Looks after sister who has been having memory decline, as well  as husband with minor memory decline.    Enjoys writing poetry, reading, going to church, travel   Social Determinants of Health   Financial Resource Strain: Low Risk   . Difficulty of Paying Living Expenses: Not hard at all  Food Insecurity: No Food Insecurity  . Worried About Charity fundraiser in the Last Year: Never true  . Ran Out of Food in the Last Year: Never true  Transportation Needs: No Transportation Needs  . Lack of Transportation (Medical): No  . Lack of Transportation (Non-Medical): No  Physical Activity: Inactive  . Days of Exercise per Week: 0 days  . Minutes of Exercise per Session: 0 min  Stress: No Stress Concern Present  . Feeling of Stress : Not at all  Social Connections: Moderately Integrated  . Frequency of Communication with Friends and Family: More than three times a week  . Frequency of Social Gatherings with Friends and Family: More than three times a week  . Attends Religious Services: More than 4 times per year  . Active Member of Clubs or Organizations: No  . Attends Archivist Meetings: Never  . Marital Status: Married    Tobacco Counseling Counseling given: Not Answered   Clinical Intake:  Pre-visit preparation completed: Yes  Pain : No/denies pain     BMI - recorded: 33.16 Nutritional Status: BMI > 30   Obese Nutritional Risks: None Diabetes: No  How often do you need to have someone help you when you read instructions, pamphlets, or other written materials from your doctor or pharmacy?: 1 - Never  Diabetic?No  Interpreter Needed?: No  Information entered by :: Charlott Rakes, LPN   Activities of Daily Living In your present state of health, do you have any difficulty performing the following activities: 03/27/2021  Hearing? Y  Comment left ear hoh  Vision? N  Difficulty concentrating or making decisions? N  Walking or climbing stairs? Y  Comment related to hip, knee and back  Dressing or bathing? N  Doing errands, shopping? N  Preparing Food and eating ? N  Using the Toilet? N  In the past six months, have you accidently leaked urine? N  Do you have problems with loss of bowel control? N  Managing your Medications? N  Managing your Finances? N  Housekeeping or managing your Housekeeping? N  Some recent data might be hidden    Patient Care Team: Caren Macadam, MD as PCP - General (Family Medicine) Dorothy Spark, MD (Inactive) as PCP - Cardiology (Cardiology) Dorothy Spark, MD (Inactive) as Consulting Physician (Cardiology) Ladene Artist, MD as Consulting Physician (Gastroenterology) Festus Aloe, MD as Consulting Physician (Urology) [name], Darrick Grinder, MD as Consulting Physician (Allergy and Immunology) Monna Fam, MD as Consulting Physician (Ophthalmology) [name], MD (Dermatology) [name], MD as Consulting Physician (Orthopedic Surgery)  Indicate any recent Medical Services you may have received from other than [facility] providers in the past year (date may be approximate).     Assessment:   This is a routine wellness examination for Deepika.  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 Patient Stated       Lose weight       Depression Screen PHQ 2/9 Scores 03/27/2021 12/13/2019 12/09/2018 10/22/2017 07/03/2016 11/27/2015 08/06/2015  PHQ - 2 Score 0 0 0 0 0 0 0  PHQ- 9 Score - - 0 - - - -    Fall Risk Fall Risk  03/27/2021 07/24/2020 12/13/2019 12/09/2018 10/22/2017  Falls in the past year? 1 0 0 0 No  Number falls in past yr: 1 0 - - -  Injury with Fall? 0 0 - - -  Risk for fall due to : Impaired vision - Medication side effect - -  Follow up Falls prevention discussed - Falls evaluation completed;Education provided;Falls prevention discussed - -    FALL RISK PREVENTION PERTAINING TO THE HOME:  Any stairs in or around the home? Yes  If so, are there any without handrails? No  Home free of loose throw rugs in walkways, pet beds, electrical cords, etc? Yes  Adequate lighting in your home to reduce risk of falls?  Yes   ASSISTIVE DEVICES UTILIZED TO PREVENT FALLS:  Life alert? No  Use of a cane, walker or w/c? No  Grab bars in the bathroom? Yes  Shower chair or bench in shower? Yes  Elevated toilet seat or a handicapped toilet? Yes   TIMED UP AND GO:  Was the test performed? No .     Cognitive Function: MMSE - Mini Mental State Exam 10/22/2017 07/05/2016  Not completed: (No Data) (No Data)     6CIT Screen 03/27/2021 12/13/2019  What Year? 0 points 0 points  What month? 0 points 0 points  What time? 0 points 0 points  Count back from 20 0 points 0 points  Months in reverse 0 points 0 points  Repeat phrase 0 points 0 points  Total Score 0 0    Immunizations Immunization History  Administered Date(s) Administered  . Fluad Quad(high Dose 65+) 07/02/2019  . Influenza  Split 09/18/2011, 08/11/2012, 07/28/2014  . Influenza Whole 07/21/2008, 08/04/2009, 08/01/2010  . Influenza, High Dose Seasonal PF 07/19/2013, 07/28/2015, 10/17/2016, 08/11/2017, 09/01/2017, 08/04/2018, 09/11/2018, 09/01/2019  . Influenza,inj,Quad PF,6+ Mos 07/03/2016  . PFIZER(Purple Top)SARS-COV-2 Vaccination 11/20/2019, 12/08/2019, 03/22/2020  . Pneumococcal Conjugate-13 12/06/2013  . Pneumococcal Polysaccharide-23 10/03/2008, 10/17/2016, 11/24/2017, 09/11/2018, 07/12/2019, 09/01/2019  . Tdap 11/04/2008, 10/24/2015  . Zoster 11/05/2003  . Zoster Recombinat (Shingrix) 07/30/2019, 09/03/2019, 02/21/2020    TDAP status: Up to date  Flu Vaccine status: Due, Education has been provided regarding the importance of this vaccine. Advised may receive this vaccine at local pharmacy or Health Dept. Aware to provide a copy of the vaccination record if obtained from local pharmacy or Health Dept. Verbalized acceptance and understanding.  Pneumococcal vaccine status: Up to date  Covid-19 vaccine status: Completed vaccines  Qualifies for Shingles Vaccine? Yes   Zostavax completed Yes   Shingrix Completed?: Yes  Screening Tests Health Maintenance  Topic Date Due  . COLONOSCOPY (Pts 45-53yrs Insurance coverage will need to be confirmed)  02/07/2020  . COVID-19 Vaccine (4 - Booster for Pfizer series) 06/22/2020  . INFLUENZA VACCINE  06/04/2021  . MAMMOGRAM  02/14/2022  . TETANUS/TDAP  10/23/2025  . DEXA SCAN  Completed  . PNA vac Low Risk Adult  Completed  . HPV VACCINES  Aged Out    Health Maintenance  Health Maintenance Due  Topic Date Due  . COLONOSCOPY (Pts 45-39yrs Insurance coverage will need to be confirmed)  02/07/2020  . COVID-19 Vaccine (4 - Booster for Pfizer series) 06/22/2020    Colorectal cancer screening: Type of screening: Colonoscopy. Completed 02/07/15. Repeat every 5 years  Mammogram status: Completed 02/14/21. Repeat every year  Bone Density status: Completed  05/17/20. Results reflect: Bone density results: OSTEOPOROSIS. Repeat every 2 years.  Additional Screening:   Vision Screening: Recommended annual ophthalmology exams for early detection of glaucoma and other disorders of the eye. Is the patient up to date with their annual eye exam?  Yes  Who is the provider or what is the name of the office in which the patient attends annual eye exams? Dr Herbert Deaner  If pt is not established with a provider, would they like to be referred to a provider to establish care? No .   Dental Screening: Recommended annual dental exams for proper oral hygiene  Community Resource Referral / Chronic Care Management: CRR required this visit?  No   CCM required this visit?  No      Plan:     I have personally reviewed and noted the following in  the patient's chart:   . Medical and social history . Use of alcohol, tobacco or illicit drugs  . Current medications and supplements including opioid prescriptions.  . Functional ability and status . Nutritional status . Physical activity . Advanced directives . List of other physicians . Hospitalizations, surgeries, and ER visits in previous 12 months . Vitals . Screenings to include cognitive, depression, and falls . Referrals and appointments  In addition, I have reviewed and discussed with patient certain preventive protocols, quality metrics, and best practice recommendations. A written personalized care plan for preventive services as well as general preventive health recommendations were provided to patient.     Willette Brace, LPN   [id number]   Nurse Notes: None

## 2021-03-27 NOTE — Patient Instructions (Addendum)
Brianna Mccarty , Thank you for taking time to come for your Medicare Wellness Visit. I appreciate your ongoing commitment to your health goals. Please review the following plan we discussed and let me know if I can assist you in the future.   Screening recommendations/referrals: Colonoscopy: Done 02/07/15 Mammogram: Done 01/25/20 Bone Density: Done 05/17/20 Recommended yearly ophthalmology/optometry visit for glaucoma screening and checkup Recommended yearly dental visit for hygiene and checkup  Vaccinations: Influenza vaccine: Due in season Pneumococcal vaccine: Up to date Tdap vaccine: Up to date Shingles vaccine: Completed 9/25, 09/03/19, & 02/21/20   Covid-19:Completed 1/6, 2/3, & 03/22/20  Advanced directives: Copies in chart  Conditions/risks identified: lose weight   Next appointment: Follow up in one year for your annual wellness visit    Preventive Care 81 Years and Older, Female Preventive care refers to lifestyle choices and visits with your health care provider that can promote health and wellness. What does preventive care include?  A yearly physical exam. This is also called an annual well check.  Dental exams once or twice a year.  Routine eye exams. Ask your health care provider how often you should have your eyes checked.  Personal lifestyle choices, including:  Daily care of your teeth and gums.  Regular physical activity.  Eating a healthy diet.  Avoiding tobacco and drug use.  Limiting alcohol use.  Practicing safe sex.  Taking low-dose aspirin every day.  Taking vitamin and mineral supplements as recommended by your health care provider. What happens during an annual well check? The services and screenings done by your health care provider during your annual well check will depend on your age, overall health, lifestyle risk factors, and family history of disease. Counseling  Your health care provider may ask you questions about your:  Alcohol  use.  Tobacco use.  Drug use.  Emotional well-being.  Home and relationship well-being.  Sexual activity.  Eating habits.  History of falls.  Memory and ability to understand (cognition).  Work and work Statistician.  Reproductive health. Screening  You may have the following tests or measurements:  Height, weight, and BMI.  Blood pressure.  Lipid and cholesterol levels. These may be checked every 5 years, or more frequently if you are over 81 years old.  Skin check.  Lung cancer screening. You may have this screening every year starting at age 81 if you have a 30-pack-year history of smoking and currently smoke or have quit within the past 15 years.  Fecal occult blood test (FOBT) of the stool. You may have this test every year starting at age 81.  Flexible sigmoidoscopy or colonoscopy. You may have a sigmoidoscopy every 5 years or a colonoscopy every 10 years starting at age 81.  Hepatitis C blood test.  Hepatitis B blood test.  Sexually transmitted disease (STD) testing.  Diabetes screening. This is done by checking your blood sugar (glucose) after you have not eaten for a while (fasting). You may have this done every 1-3 years.  Bone density scan. This is done to screen for osteoporosis. You may have this done starting at age 81.  Mammogram. This may be done every 1-2 years. Talk to your health care provider about how often you should have regular mammograms. Talk with your health care provider about your test results, treatment options, and if necessary, the need for more tests. Vaccines  Your health care provider may recommend certain vaccines, such as:  Influenza vaccine. This is recommended every year.  Tetanus, diphtheria,  and acellular pertussis (Tdap, Td) vaccine. You may need a Td booster every 10 years.  Zoster vaccine. You may need this after age 81.  Pneumococcal 13-valent conjugate (PCV13) vaccine. One dose is recommended after age  81.  Pneumococcal polysaccharide (PPSV23) vaccine. One dose is recommended after age 81. Talk to your health care provider about which screenings and vaccines you need and how often you need them. This information is not intended to replace advice given to you by your health care provider. Make sure you discuss any questions you have with your health care provider. Document Released: 11/17/2015 Document Revised: 07/10/2016 Document Reviewed: 08/22/2015 Elsevier Interactive Patient Education  2017 [employer] Prevention in the Home Falls can cause injuries. They can happen to people of all ages. There are many things you can do to make your home safe and to help prevent falls. What can I do on the outside of my home?  Regularly fix the edges of walkways and driveways and fix any cracks.  Remove anything that might make you trip as you walk through a door, such as a raised step or threshold.  Trim any bushes or trees on the path to your home.  Use bright outdoor lighting.  Clear any walking paths of anything that might make someone trip, such as rocks or tools.  Regularly check to see if handrails are loose or broken. Make sure that both sides of any steps have handrails.  Any raised decks and porches should have guardrails on the edges.  Have any leaves, snow, or ice cleared regularly.  Use sand or salt on walking paths during winter.  Clean up any spills in your garage right away. This includes oil or grease spills. What can I do in the bathroom?  Use night lights.  Install grab bars by the toilet and in the tub and shower. Do not use towel bars as grab bars.  Use non-skid mats or decals in the tub or shower.  If you need to sit down in the shower, use a plastic, non-slip stool.  Keep the floor dry. Clean up any water that spills on the floor as soon as it happens.  Remove soap buildup in the tub or shower regularly.  Attach bath mats securely with double-sided  non-slip rug tape.  Do not have throw rugs and other things on the floor that can make you trip. What can I do in the bedroom?  Use night lights.  Make sure that you have a light by your bed that is easy to reach.  Do not use any sheets or blankets that are too big for your bed. They should not hang down onto the floor.  Have a firm chair that has side arms. You can use this for support while you get dressed.  Do not have throw rugs and other things on the floor that can make you trip. What can I do in the kitchen?  Clean up any spills right away.  Avoid walking on wet floors.  Keep items that you use a lot in easy-to-reach places.  If you need to reach something above you, use a strong step stool that has a grab bar.  Keep electrical cords out of the way.  Do not use floor polish or wax that makes floors slippery. If you must use wax, use non-skid floor wax.  Do not have throw rugs and other things on the floor that can make you trip. What can I do with my  stairs?  Do not leave any items on the stairs.  Make sure that there are handrails on both sides of the stairs and use them. Fix handrails that are broken or loose. Make sure that handrails are as long as the stairways.  Check any carpeting to make sure that it is firmly attached to the stairs. Fix any carpet that is loose or worn.  Avoid having throw rugs at the top or bottom of the stairs. If you do have throw rugs, attach them to the floor with carpet tape.  Make sure that you have a light switch at the top of the stairs and the bottom of the stairs. If you do not have them, ask someone to add them for you. What else can I do to help prevent falls?  Wear shoes that:  Do not have high heels.  Have rubber bottoms.  Are comfortable and fit you well.  Are closed at the toe. Do not wear sandals.  If you use a stepladder:  Make sure that it is fully opened. Do not climb a closed stepladder.  Make sure that both  sides of the stepladder are locked into place.  Ask someone to hold it for you, if possible.  Clearly mark and make sure that you can see:  Any grab bars or handrails.  First and last steps.  Where the edge of each step is.  Use tools that help you move around (mobility aids) if they are needed. These include:  Canes.  Walkers.  Scooters.  Crutches.  Turn on the lights when you go into a dark area. Replace any light bulbs as soon as they burn out.  Set up your furniture so you have a clear path. Avoid moving your furniture around.  If any of your floors are uneven, fix them.  If there are any pets around you, be aware of where they are.  Review your medicines with your doctor. Some medicines can make you feel dizzy. This can increase your chance of falling. Ask your doctor what other things that you can do to help prevent falls. This information is not intended to replace advice given to you by your health care provider. Make sure you discuss any questions you have with your health care provider. Document Released: 08/17/2009 Document Revised: 03/28/2016 Document Reviewed: 11/25/2014 Elsevier Interactive Patient Education  2017 [employer].

## 2021-03-28 DIAGNOSIS — D225 Melanocytic nevi of trunk: Secondary | ICD-10-CM | POA: Diagnosis not present

## 2021-03-28 DIAGNOSIS — Z85820 Personal history of malignant melanoma of skin: Secondary | ICD-10-CM | POA: Diagnosis not present

## 2021-03-28 DIAGNOSIS — D485 Neoplasm of uncertain behavior of skin: Secondary | ICD-10-CM | POA: Diagnosis not present

## 2021-03-28 DIAGNOSIS — Z1283 Encounter for screening for malignant neoplasm of skin: Secondary | ICD-10-CM | POA: Diagnosis not present

## 2021-03-28 DIAGNOSIS — L280 Lichen simplex chronicus: Secondary | ICD-10-CM | POA: Diagnosis not present

## 2021-03-28 DIAGNOSIS — Z08 Encounter for follow-up examination after completed treatment for malignant neoplasm: Secondary | ICD-10-CM | POA: Diagnosis not present

## 2021-04-03 DIAGNOSIS — J3089 Other allergic rhinitis: Secondary | ICD-10-CM | POA: Diagnosis not present

## 2021-04-03 DIAGNOSIS — J301 Allergic rhinitis due to pollen: Secondary | ICD-10-CM | POA: Diagnosis not present

## 2021-04-06 DIAGNOSIS — M25551 Pain in right hip: Secondary | ICD-10-CM | POA: Diagnosis not present

## 2021-04-09 DIAGNOSIS — J301 Allergic rhinitis due to pollen: Secondary | ICD-10-CM | POA: Diagnosis not present

## 2021-04-09 DIAGNOSIS — J3089 Other allergic rhinitis: Secondary | ICD-10-CM | POA: Diagnosis not present

## 2021-04-12 ENCOUNTER — Other Ambulatory Visit (HOSPITAL_COMMUNITY): Payer: PPO

## 2021-04-12 DIAGNOSIS — J3089 Other allergic rhinitis: Secondary | ICD-10-CM | POA: Diagnosis not present

## 2021-04-12 DIAGNOSIS — J301 Allergic rhinitis due to pollen: Secondary | ICD-10-CM | POA: Diagnosis not present

## 2021-04-16 DIAGNOSIS — J3089 Other allergic rhinitis: Secondary | ICD-10-CM | POA: Diagnosis not present

## 2021-04-16 DIAGNOSIS — J301 Allergic rhinitis due to pollen: Secondary | ICD-10-CM | POA: Diagnosis not present

## 2021-04-23 ENCOUNTER — Other Ambulatory Visit: Payer: Self-pay

## 2021-04-23 ENCOUNTER — Ambulatory Visit (HOSPITAL_COMMUNITY): Payer: PPO | Attending: Cardiology

## 2021-04-23 DIAGNOSIS — I350 Nonrheumatic aortic (valve) stenosis: Secondary | ICD-10-CM

## 2021-04-23 LAB — ECHOCARDIOGRAM COMPLETE
AR max vel: 1.31 cm2
AV Area VTI: 1.38 cm2
AV Area mean vel: 1.22 cm2
AV Mean grad: 10.5 mmHg
AV Peak grad: 20.5 mmHg
Ao pk vel: 2.27 m/s
Area-P 1/2: 2.56 cm2
S' Lateral: 2.3 cm

## 2021-04-24 ENCOUNTER — Telehealth: Payer: Self-pay | Admitting: *Deleted

## 2021-04-24 DIAGNOSIS — J3089 Other allergic rhinitis: Secondary | ICD-10-CM | POA: Diagnosis not present

## 2021-04-24 DIAGNOSIS — Z0189 Encounter for other specified special examinations: Secondary | ICD-10-CM

## 2021-04-24 DIAGNOSIS — I77819 Aortic ectasia, unspecified site: Secondary | ICD-10-CM

## 2021-04-24 DIAGNOSIS — I77810 Thoracic aortic ectasia: Secondary | ICD-10-CM

## 2021-04-24 DIAGNOSIS — I10 Essential (primary) hypertension: Secondary | ICD-10-CM

## 2021-04-24 DIAGNOSIS — I350 Nonrheumatic aortic (valve) stenosis: Secondary | ICD-10-CM

## 2021-04-24 DIAGNOSIS — J301 Allergic rhinitis due to pollen: Secondary | ICD-10-CM | POA: Diagnosis not present

## 2021-04-24 NOTE — Telephone Encounter (Signed)
Pt made aware of echo results and recommendations per Dr. Johney Frame. 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 Informed the pt that I will go ahead and place the order for her to have this done in one year, and send a message to our CT Scheduler to call her back and arrange this appt.  Pt also aware she needs to maintain good BP control, watch her salt intake, and continue her med regimen going forward, to maintain stability. Pt verbalized understanding and agrees with this plan.

## 2021-04-24 NOTE — Telephone Encounter (Signed)
-----   Message from Freada Bergeron, MD sent at 04/24/2021  8:03 AM EDT ----- Her echo is stable with normal pumping function and mild narrowing of her aortic valve. She has mild leakiness of her mitral valve. Her aorta is a little dilated which we will just monitor with yearly CTA or MRA scans. We just need to make sure her blood pressure is well controlled going forward.

## 2021-05-04 DIAGNOSIS — H1045 Other chronic allergic conjunctivitis: Secondary | ICD-10-CM | POA: Diagnosis not present

## 2021-05-04 DIAGNOSIS — J3089 Other allergic rhinitis: Secondary | ICD-10-CM | POA: Diagnosis not present

## 2021-05-04 DIAGNOSIS — J301 Allergic rhinitis due to pollen: Secondary | ICD-10-CM | POA: Diagnosis not present

## 2021-05-04 DIAGNOSIS — K219 Gastro-esophageal reflux disease without esophagitis: Secondary | ICD-10-CM | POA: Diagnosis not present

## 2021-05-05 ENCOUNTER — Other Ambulatory Visit: Payer: Self-pay | Admitting: Family Medicine

## 2021-05-11 DIAGNOSIS — J3089 Other allergic rhinitis: Secondary | ICD-10-CM | POA: Diagnosis not present

## 2021-05-11 DIAGNOSIS — J301 Allergic rhinitis due to pollen: Secondary | ICD-10-CM | POA: Diagnosis not present

## 2021-05-18 DIAGNOSIS — M25511 Pain in right shoulder: Secondary | ICD-10-CM | POA: Diagnosis not present

## 2021-05-18 DIAGNOSIS — M25551 Pain in right hip: Secondary | ICD-10-CM | POA: Diagnosis not present

## 2021-05-18 DIAGNOSIS — J301 Allergic rhinitis due to pollen: Secondary | ICD-10-CM | POA: Diagnosis not present

## 2021-05-18 DIAGNOSIS — J3089 Other allergic rhinitis: Secondary | ICD-10-CM | POA: Diagnosis not present

## 2021-05-21 ENCOUNTER — Telehealth: Payer: Self-pay | Admitting: Cardiology

## 2021-05-21 DIAGNOSIS — Z01818 Encounter for other preprocedural examination: Secondary | ICD-10-CM

## 2021-05-21 DIAGNOSIS — I77810 Thoracic aortic ectasia: Secondary | ICD-10-CM

## 2021-05-21 DIAGNOSIS — I10 Essential (primary) hypertension: Secondary | ICD-10-CM

## 2021-05-21 DIAGNOSIS — E785 Hyperlipidemia, unspecified: Secondary | ICD-10-CM

## 2021-05-21 DIAGNOSIS — I350 Nonrheumatic aortic (valve) stenosis: Secondary | ICD-10-CM

## 2021-05-21 NOTE — Telephone Encounter (Signed)
Patient called and wanted to know if Dr. Johney Frame wanted her to take a stress test or not

## 2021-05-21 NOTE — Telephone Encounter (Signed)
Will need a myoview if she is planned for orthopedic surgery.

## 2021-05-22 ENCOUNTER — Encounter: Payer: Self-pay | Admitting: *Deleted

## 2021-05-22 NOTE — Telephone Encounter (Signed)
Spoke with the pt and she states that she will be having her orthopedic surgery done in late Sept, and will require a lexiscan before then, to clear her for this.  Order of lexiscan placed in the system.  Went over lexi instructions with the pt over the phone, and informed her that our nuc dept will call her a couple days before this test, to go over these instructions again.  Informed the pt she will be receiving a call here shortly to arrange this appt. Attestation order pended for Dr. Johney Frame to sign, when she returns to the office. Pt verbalized understanding and agrees with this plan. Will send this message back to Dr. Johney Frame to make her aware of lexiscan order placed and to sign off on pended attestation order.

## 2021-05-22 NOTE — Telephone Encounter (Signed)
Pt is returning a call  

## 2021-05-22 NOTE — Telephone Encounter (Signed)
Left the pt a message to call the office back to endorse recommendations per Dr. Pemberton. 

## 2021-05-23 NOTE — Telephone Encounter (Signed)
Pt is scheduled for her [place] for 06/06/21 at 1100. Pt made aware of appt date and time by Nuclear Scheduler.

## 2021-05-24 DIAGNOSIS — J301 Allergic rhinitis due to pollen: Secondary | ICD-10-CM | POA: Diagnosis not present

## 2021-05-24 DIAGNOSIS — J3089 Other allergic rhinitis: Secondary | ICD-10-CM | POA: Diagnosis not present

## 2021-05-30 ENCOUNTER — Telehealth (HOSPITAL_COMMUNITY): Payer: Self-pay | Admitting: *Deleted

## 2021-05-30 ENCOUNTER — Telehealth: Payer: Self-pay | Admitting: *Deleted

## 2021-05-30 NOTE — Telephone Encounter (Signed)
Dr Ethlyn Gallery received a fax from [facility] requesting a surgical clearance for the pt.  Per Dr Ethlyn Gallery I spoke with the pt and informed her an appt is needed prior to completing the form.  Patient stated she is on vacation and will call back to schedule an appt.  Fax returned to PCP.

## 2021-05-30 NOTE — Telephone Encounter (Signed)
Patient given detailed instructions per Myocardial Perfusion Study Information Sheet for the test on 06/06/21. Patient notified to arrive 15 minutes early and that it is imperative to arrive on time for appointment to keep from having the test rescheduled.  If you need to cancel or reschedule your appointment, please call the office within 24 hours of your appointment. . Patient verbalized understanding. Brycelynn Stampley Jacqueline   

## 2021-06-01 DIAGNOSIS — J301 Allergic rhinitis due to pollen: Secondary | ICD-10-CM | POA: Diagnosis not present

## 2021-06-01 DIAGNOSIS — J3089 Other allergic rhinitis: Secondary | ICD-10-CM | POA: Diagnosis not present

## 2021-06-06 ENCOUNTER — Other Ambulatory Visit: Payer: Self-pay

## 2021-06-06 ENCOUNTER — Ambulatory Visit (HOSPITAL_COMMUNITY): Payer: PPO | Attending: Cardiology

## 2021-06-06 DIAGNOSIS — I350 Nonrheumatic aortic (valve) stenosis: Secondary | ICD-10-CM | POA: Diagnosis not present

## 2021-06-06 DIAGNOSIS — I77810 Thoracic aortic ectasia: Secondary | ICD-10-CM

## 2021-06-06 DIAGNOSIS — I10 Essential (primary) hypertension: Secondary | ICD-10-CM | POA: Diagnosis not present

## 2021-06-06 DIAGNOSIS — Z01818 Encounter for other preprocedural examination: Secondary | ICD-10-CM | POA: Diagnosis not present

## 2021-06-06 DIAGNOSIS — Z01810 Encounter for preprocedural cardiovascular examination: Secondary | ICD-10-CM | POA: Diagnosis not present

## 2021-06-06 DIAGNOSIS — E785 Hyperlipidemia, unspecified: Secondary | ICD-10-CM

## 2021-06-06 LAB — MYOCARDIAL PERFUSION IMAGING
LV dias vol: 68 mL (ref 46–106)
LV sys vol: 18 mL
Peak HR: 94 {beats}/min
Rest HR: 67 {beats}/min
SDS: 1
SRS: 0
SSS: 1
TID: 0.89

## 2021-06-06 MED ORDER — TECHNETIUM TC 99M TETROFOSMIN IV KIT
10.9000 | PACK | Freq: Once | INTRAVENOUS | Status: AC | PRN
  Administered 2021-06-06: 10.9 via INTRAVENOUS
  Filled 2021-06-06: qty 11

## 2021-06-06 MED ORDER — REGADENOSON 0.4 MG/5ML IV SOLN
0.4000 mg | Freq: Once | INTRAVENOUS | Status: AC
  Administered 2021-06-06: 0.4 mg via INTRAVENOUS

## 2021-06-06 MED ORDER — TECHNETIUM TC 99M TETROFOSMIN IV KIT
30.5000 | PACK | Freq: Once | INTRAVENOUS | Status: AC | PRN
  Administered 2021-06-06: 30.5 via INTRAVENOUS
  Filled 2021-06-06: qty 31

## 2021-06-08 DIAGNOSIS — M545 Low back pain, unspecified: Secondary | ICD-10-CM | POA: Diagnosis not present

## 2021-06-08 DIAGNOSIS — J301 Allergic rhinitis due to pollen: Secondary | ICD-10-CM | POA: Diagnosis not present

## 2021-06-08 DIAGNOSIS — J3089 Other allergic rhinitis: Secondary | ICD-10-CM | POA: Diagnosis not present

## 2021-06-14 DIAGNOSIS — J3089 Other allergic rhinitis: Secondary | ICD-10-CM | POA: Diagnosis not present

## 2021-06-14 DIAGNOSIS — J301 Allergic rhinitis due to pollen: Secondary | ICD-10-CM | POA: Diagnosis not present

## 2021-06-18 ENCOUNTER — Other Ambulatory Visit: Payer: Self-pay | Admitting: Family Medicine

## 2021-06-18 MED ORDER — BUDESONIDE-FORMOTEROL FUMARATE 160-4.5 MCG/ACT IN AERO: 2.0000 | INHALATION_SPRAY | Freq: Two times a day (BID) | RESPIRATORY_TRACT | 3 refills | Status: DC

## 2021-06-21 DIAGNOSIS — J3089 Other allergic rhinitis: Secondary | ICD-10-CM | POA: Diagnosis not present

## 2021-06-21 DIAGNOSIS — J301 Allergic rhinitis due to pollen: Secondary | ICD-10-CM | POA: Diagnosis not present

## 2021-06-29 ENCOUNTER — Other Ambulatory Visit: Payer: Self-pay

## 2021-06-29 ENCOUNTER — Ambulatory Visit (INDEPENDENT_AMBULATORY_CARE_PROVIDER_SITE_OTHER): Payer: PPO | Admitting: Family Medicine

## 2021-06-29 ENCOUNTER — Encounter: Payer: Self-pay | Admitting: Family Medicine

## 2021-06-29 VITALS — BP 128/60 | HR 76 | Temp 98.0°F | Ht 61.0 in | Wt 173.4 lb

## 2021-06-29 DIAGNOSIS — R739 Hyperglycemia, unspecified: Secondary | ICD-10-CM | POA: Diagnosis not present

## 2021-06-29 DIAGNOSIS — E7800 Pure hypercholesterolemia, unspecified: Secondary | ICD-10-CM

## 2021-06-29 DIAGNOSIS — M810 Age-related osteoporosis without current pathological fracture: Secondary | ICD-10-CM | POA: Diagnosis not present

## 2021-06-29 DIAGNOSIS — Z01818 Encounter for other preprocedural examination: Secondary | ICD-10-CM | POA: Diagnosis not present

## 2021-06-29 DIAGNOSIS — J3089 Other allergic rhinitis: Secondary | ICD-10-CM | POA: Diagnosis not present

## 2021-06-29 DIAGNOSIS — J4530 Mild persistent asthma, uncomplicated: Secondary | ICD-10-CM

## 2021-06-29 DIAGNOSIS — I10 Essential (primary) hypertension: Secondary | ICD-10-CM | POA: Diagnosis not present

## 2021-06-29 DIAGNOSIS — J301 Allergic rhinitis due to pollen: Secondary | ICD-10-CM | POA: Diagnosis not present

## 2021-06-29 NOTE — Progress Notes (Signed)
Brianna Mccarty Date of Birth:  Nov 28, 1940  This patient presents to the office today for a preoperative consultation at the request of surgeon, Dr. Wynelle Link, who plans on performing total right hip  onSeptember 28.  She is ready to get this done. Feels like hip went bad quickly. Has been following regularly with ortho. This has kept her from exercising like she was.   Having some headaches with allergy season.   HTN: does check at home. Was up in the last week; but worrying about daughter. She does follow regularly with cardiology and recently completed a myocardial perfusion study (06/06/21) which was normal/low risk and she did receive cardiac clearance for surgery.  Macular degeneration: doing ok with this; stable. Following with Dr. Zigmund Daniel and sees him yearly.   GERD: well controlled with omeprazole.   Planned anesthesia: general  Known anesthesia problems: Negative  Bleeding risk: none Personal or FH of DVT/PE: none    Patient Active Problem List   Diagnosis Date Noted   Genetic testing 09/13/2020   Family history of pancreatic cancer 09/05/2020   Family history of colon cancer 09/05/2020   Family history of cancer of extrahepatic bile ducts 09/05/2020   Fever 07/26/2020   History of COVID-19 07/26/2020   History of melanoma 05/12/2020   Macular degeneration 02/17/2019   OSA (obstructive sleep apnea) 02/12/2018   Hyperglycemia 02/12/2018   H/O cold sores 11/27/2015   Murmur 10/20/2015   Carotid artery disease ([place])    Thyroid cyst    Venous (peripheral) insufficiency 06/02/2009   Asthma 04/27/2008   Melanoma of skin ([place]) 02/10/2008   Allergic rhinitis 02/10/2008   INTERSTITIAL CYSTITIS 02/10/2008   HYPERCHOLESTEROLEMIA 02/09/2008   Essential hypertension 02/09/2008   GERD 02/09/2008   Osteoporosis 02/09/2008   Past Surgical History:  Procedure Laterality Date   BREAST BIOPSY Left 11/25/2011   U/S core, benign performed at [facility], [place] CYST  ASPIRATION     BREAST SURGERY  2013   Breast Bx-Benign   CARDIAC CATHETERIZATION     CATARACT EXTRACTION, BILATERAL     CHOLECYSTECTOMY N/A 12/22/2019   Procedure: LAPAROSCOPIC CHOLECYSTECTOMY;  Surgeon: Ileana Roup, MD;  Location: [facility];  Service: General;  Laterality: N/A;   cystoscopy and basket stone removal right ureter  02/2006   Dr. Terance Hart   GALLBLADDER SURGERY  12/22/2019   [place]   left total hip replacement  2004   Dr. [place] Lighter   melanoma removed from medial rleft knee area  2006   Dr. [place] Crane   NASAL SEPTUM SURGERY      Allergies  Allergen Reactions   Celebrex [Celecoxib] Diarrhea   Cephalexin     Reaction was a high fever   Hydrochlorothiazide     hyponatremia   Penicillins Hives and Swelling    Swelling of arms & face Has patient had a PCN reaction causing immediate rash, facial/tongue/throat swelling, SOB or lightheadedness with hypotension: Yes Has patient had a PCN reaction causing severe rash involving mucus membranes or skin necrosis: No Has patient had a PCN reaction that required hospitalization: No Has patient had a PCN reaction occurring within the last 10 years: No If all of the above answers are "NO", then may proceed with Cephalosporin use.    Phenobarbital Hives   Rosuvastatin Other (See Comments)    Reports causes lower extremity muscle aches   Tape Rash    MEDICAL TAPE   Current Meds  Medication  Sig   albuterol (PROAIR HFA) 108 (90 Base) MCG/ACT inhaler Inhale 2 puffs into the lungs every 6 (six) hours as needed for wheezing.   albuterol (PROVENTIL) (2.5 MG/3ML) 0.083% nebulizer solution Take 3 mLs (2.5 mg total) by nebulization every 6 (six) hours as needed.   alendronate (FOSAMAX) 70 MG tablet TAKE 1 TABLET EVERY 7 (SEVEN) DAYS. TAKE WITH A FULL GLASS OF WATER ON AN EMPTY STOMACH.   amLODipine (NORVASC) 10 MG tablet Take 1 tablet (10 mg total) by mouth daily.   Ascorbic Acid (VITAMIN C) 500 MG  tablet Take 500 mg by mouth daily.   aspirin 81 MG tablet Take 81 mg by mouth daily.   Aspirin-Acetaminophen-Caffeine (EXCEDRIN PO) Take by mouth as needed.   azelastine (ASTELIN) 0.1 % nasal spray Instill 2 sprays into each nostril two times a day as needed for allergies   Bempedoic Acid (NEXLETOL) 180 MG TABS Take 1 tablet by mouth daily.   budesonide-formoterol (SYMBICORT) 160-4.5 MCG/ACT inhaler Inhale 2 puffs into the lungs 2 (two) times daily.   calcium carbonate (OSCAL) 1500 (600 Ca) MG TABS tablet Take 1 tablet by mouth daily.   Cholecalciferol (VITAMIN D3) 5000 UNITS CAPS Take 5,000 Units by mouth daily.   Cinnamon 500 MG TABS Take 1 tablet by mouth daily.    clobetasol (TEMOVATE) 0.05 % external solution Apply topically.   Coenzyme Q10 (CO Q-10) 200 MG CAPS Take 200 mg by mouth daily.   desmopressin (DDAVP) 0.2 MG tablet Take 0.6 mg by mouth at bedtime.    EPINEPHrine 0.3 mg/0.3 mL IJ SOAJ injection See admin instructions.   fexofenadine (ALLEGRA) 180 MG tablet Take 180 mg by mouth daily.   furosemide (LASIX) 20 MG tablet Take 20 mg by mouth as needed.   hydrALAZINE (APRESOLINE) 50 MG tablet TAKE 1 TAB 3 TIMES A DAY AND TAKE EXTRA TAB IF SBP>160   hydrOXYzine (ATARAX/VISTARIL) 10 MG tablet Take 10 mg by mouth at bedtime.    losartan (COZAAR) 100 MG tablet Take 1 tablet (100 mg total) by mouth daily.   magnesium oxide (MAG-OX) 400 MG tablet Take 400 mg by mouth daily.   Multiple Vitamins-Minerals (ICAPS AREDS 2 PO) Take by mouth daily.   omeprazole (PRILOSEC) 40 MG capsule TAKE 1 CAPSULE BY MOUTH EVERY DAY.   pentosan polysulfate (ELMIRON) 100 MG capsule Take 100 mg by mouth daily.   prazosin (MINIPRESS) 5 MG capsule *NEED OFFICE VISIT* TAKE 1 CAPSULE BY MOUTH TWICE A DAY   Probiotic Product (PROBIOTIC PO) Take 1 capsule by mouth daily.    UNABLE TO FIND Med Name: JUICE PLUS TAKES 6 CAPS PER DAY   UNABLE TO FIND Med Name: NOPOLEA 2 OUNCES PER DAY   UNABLE TO FIND Med Name: JUICE  PLUS OMEGA 2 PER DAY   valACYclovir (VALTREX) 1000 MG tablet Take 1,000 mg by mouth as needed (FEVER BLISTER).     Social History   Tobacco Use   Smoking status: Never   Smokeless tobacco: Never  Substance Use Topics   Alcohol use: Yes    Alcohol/week: 1.0 standard drink    Types: 1 Standard drinks or equivalent per week    Comment:  OCC WINE   Family History  Problem Relation Age of Onset   Pancreatic cancer Father 73   Diabetes Mother    Heart disease Mother    Cancer Brother 93       Bile duct   Diabetes Brother    Hypertension Brother  Diabetes Brother    Heart disease Brother    Hypertension Brother    Hypertension Sister    Hypertension Sister    Colon cancer Paternal Uncle        dx 3s   Lung cancer Paternal Uncle        dx 31s    Review of Systems  Review of Systems  Constitutional:  Negative for chills, fever and malaise/fatigue.  HENT:  Negative for sinus pain and sore throat.   Respiratory:  Negative for cough and shortness of breath.   Cardiovascular:  Negative for chest pain, palpitations and leg swelling.  Genitourinary:  Negative for dysuria.  Musculoskeletal:  Positive for joint pain (hip; see hpi).  Neurological:  Negative for dizziness and headaches.     Recent Labs: CBC:  Lab Results  Component Value Date   WBC 7.1 07/24/2020   WBC 5.4 05/12/2020   HGB 11.3 07/24/2020   HCT 33.7 (L) 07/24/2020   MCH 29.3 07/24/2020   MCH 29.6 05/12/2020   MCHC 33.5 07/24/2020   MCHC 33.2 05/12/2020   RDW 12.9 07/24/2020   PLT 217 07/24/2020   MPV 9.7 05/12/2020   CMP:  Lab Results  Component Value Date   NA 141 07/24/2020   K 4.3 07/24/2020   CL 103 07/24/2020   CO2 26 07/24/2020   ANIONGAP 9 12/20/2019   GLUCOSE 93 07/24/2020   GLUCOSE 91 05/12/2020   BUN 12 07/24/2020   CREATININE 0.77 07/24/2020   CREATININE 0.88 05/12/2020   LABGLOB 2.5 07/24/2020   GFRAA 84 07/24/2020   CALCIUM 9.3 07/24/2020   PROT 6.6 07/24/2020   AGRATIO 1.6  07/24/2020   BILITOT 0.4 07/24/2020   ALKPHOS 58 07/24/2020   ALT 19 07/24/2020   AST 21 07/24/2020    HBA1C:  Lab Results  Component Value Date   EAG 117 05/31/2019    Objective:   BP 128/60 (BP Location: Left Arm, Patient Position: Sitting, Cuff Size: Large)   Pulse 76   Temp 98 F (36.7 C) (Oral)   Ht '5\' 1"'$  (1.549 m)   Wt 173 lb 6.4 oz (78.7 kg)   SpO2 98%   BMI 32.76 kg/m  Weight: 173 lb 6.4 oz (78.7 kg)    Physical Exam Constitutional:      General: She is not in acute distress.    Appearance: She is well-developed.  HENT:     Head: Normocephalic and atraumatic.     Right Ear: External ear normal.     Left Ear: External ear normal.     Mouth/Throat:     Pharynx: No oropharyngeal exudate.  Eyes:     Conjunctiva/sclera: Conjunctivae normal.     Pupils: Pupils are equal, round, and reactive to light.  Neck:     Thyroid: No thyromegaly.  Cardiovascular:     Rate and Rhythm: Normal rate and regular rhythm.     Heart sounds: Normal heart sounds. No murmur heard.   No friction rub. No gallop.  Pulmonary:     Effort: Pulmonary effort is normal.     Breath sounds: Normal breath sounds.  Abdominal:     General: Bowel sounds are normal. There is no distension.     Palpations: Abdomen is soft. There is no mass.     Tenderness: There is no abdominal tenderness. There is no guarding.     Hernia: No hernia is present.  Musculoskeletal:        General: No tenderness or deformity. Normal  range of motion.     Cervical back: Normal range of motion and neck supple.  Lymphadenopathy:     Cervical: No cervical adenopathy.  Skin:    General: Skin is warm and dry.     Findings: No rash.  Neurological:     Mental Status: She is alert and oriented to person, place, and time.     Deep Tendon Reflexes: Reflexes normal.     Reflex Scores:      Tricep reflexes are 2+ on the right side and 2+ on the left side.      Bicep reflexes are 2+ on the right side and 2+ on the left  side.      Brachioradialis reflexes are 2+ on the right side and 2+ on the left side.      Patellar reflexes are 2+ on the right side and 2+ on the left side. Psychiatric:        Speech: Speech normal.        Behavior: Behavior normal.        Thought Content: Thought content normal.     Lab Review: she has lab  appointment set up for pre-surgical eval; I have ordered additional baseline bloodwork that hopefully she can complete at the same time.     Assessment:   [place] was seen today for pre-op exam.  Diagnoses and all orders for this visit:  Encounter for preoperative examination for general surgical procedure  Essential hypertension  Mild persistent asthma without complication  HYPERCHOLESTEROLEMIA -     Lipid panel; Future -     TSH; Future  Hyperglycemia -     Hemoglobin A1c; Future  Osteoporosis, unspecified osteoporosis type, unspecified pathological fracture presence -     VITAMIN D 25 Hydroxy (Vit-D Deficiency, Fractures); Future  80 y.o.patient  approved for Surgery     Plan:   1. Preoperative workup as follows:none (she will get bloodwork completed per surgeons preference; she has appointment set up) 2. Change in medication regimen before surgery: no 3. No contraindications to planned surgery  Note electronically signed by provider.  Micheline Rough, MD

## 2021-06-30 ENCOUNTER — Other Ambulatory Visit: Payer: Self-pay | Admitting: Family Medicine

## 2021-07-02 ENCOUNTER — Telehealth: Payer: Self-pay | Admitting: *Deleted

## 2021-07-02 DIAGNOSIS — H6123 Impacted cerumen, bilateral: Secondary | ICD-10-CM | POA: Diagnosis not present

## 2021-07-02 NOTE — Telephone Encounter (Signed)
-----   Message from Caren Macadam, MD sent at 07/01/2021  3:08 PM EDT ----- Please fax surgical clearance note to Dr. Wynelle Link

## 2021-07-02 NOTE — Telephone Encounter (Signed)
Office notes from 06/29/2021 were faxed to [facility] at [phone] attn: surgical scheduler for Dr Wynelle Link.

## 2021-07-05 DIAGNOSIS — M25551 Pain in right hip: Secondary | ICD-10-CM | POA: Diagnosis not present

## 2021-07-05 DIAGNOSIS — M1611 Unilateral primary osteoarthritis, right hip: Secondary | ICD-10-CM | POA: Diagnosis not present

## 2021-07-06 DIAGNOSIS — J3089 Other allergic rhinitis: Secondary | ICD-10-CM | POA: Diagnosis not present

## 2021-07-06 DIAGNOSIS — J301 Allergic rhinitis due to pollen: Secondary | ICD-10-CM | POA: Diagnosis not present

## 2021-07-10 NOTE — H&P (Signed)
TOTAL HIP ADMISSION H&P  Patient is admitted for right total hip arthroplasty.  Subjective:  Chief Complaint: Right hip pain  HPI: Brianna Mccarty, 80 y.o. female, has a history of pain and functional disability in the right hip due to arthritis and patient has failed non-surgical conservative treatments for greater than 12 weeks to include corticosteriod injections and activity modification. Onset of symptoms was gradual, starting  several  years ago with gradually worsening course since that time. The patient noted no past surgery on the right hip. Patient currently rates pain in the right hip at 7 out of 10 with activity. Patient has night pain, worsening of pain with activity and weight bearing, pain that interfers with activities of daily living, and pain with passive range of motion. Patient has evidence of  bone-on-bone arthritis in the right hip  by imaging studies. This condition presents safety issues increasing the risk of falls. There is no current active infection.  Patient Active Problem List   Diagnosis Date Noted   Genetic testing 09/13/2020   Family history of pancreatic cancer 09/05/2020   Family history of colon cancer 09/05/2020   Family history of cancer of extrahepatic bile ducts 09/05/2020   Fever 07/26/2020   History of COVID-19 07/26/2020   History of melanoma 05/12/2020   Macular degeneration 02/17/2019   OSA (obstructive sleep apnea) 02/12/2018   Hyperglycemia 02/12/2018   H/O cold sores 11/27/2015   Murmur 10/20/2015   Carotid artery disease ([place])    Thyroid cyst    Venous (peripheral) insufficiency 06/02/2009   Asthma 04/27/2008   Melanoma of skin ([place]) 02/10/2008   Allergic rhinitis 02/10/2008   INTERSTITIAL CYSTITIS 02/10/2008   HYPERCHOLESTEROLEMIA 02/09/2008   Essential hypertension 02/09/2008   GERD 02/09/2008   Osteoporosis 02/09/2008    Past Medical History:  Diagnosis Date   Abnormal EKG    Normal LV function in the past   Allergic  rhinitis    Asthma    Bronchitis, mucopurulent recurrent (HCC)    Carotid artery disease ([place])    a. mild by carotid duplex.   Cervical dysplasia 1971   Diverticulosis of colon    DJD (degenerative joint disease)    Family history of cancer of extrahepatic bile ducts 09/05/2020   Family history of colon cancer 09/05/2020   Family history of pancreatic cancer 09/05/2020   GERD (gastroesophageal reflux disease)    Headache(784.0)    Hypercholesterolemia    Hypertension    Interstitial cystitis    sees urologist   Lichen sclerosus    Malignant melanoma ([place]) 2006   sees Dr. [place] Crane in dermatology   Migraines    Mild aortic stenosis    Mitral valve disease    Question mitral valve prolapse in the past, no prolapse by echo 2009   Murmur 10/20/2015   SEES DR NELSON   OSA (obstructive sleep apnea)    USES DENTAL DEVICE   Osteoporosis    on fosomax > 5 years, stopped 11/2015   Renal calculus    sees urologist   Thyroid cyst    1 x 1.1 thyroid cyst noted on carotid Doppler, January, 2012   Venous insufficiency     Past Surgical History:  Procedure Laterality Date   BREAST BIOPSY Left 11/25/2011   U/S core, benign performed at [facility], LEFT BREAT MARKER IN   BREAST CYST ASPIRATION     BREAST SURGERY  2013   Breast Bx-Benign   CARDIAC CATHETERIZATION     CATARACT EXTRACTION, BILATERAL  CHOLECYSTECTOMY N/A 12/22/2019   Procedure: LAPAROSCOPIC CHOLECYSTECTOMY;  Surgeon: Ileana Roup, MD;  Location: [facility];  Service: General;  Laterality: N/A;   cystoscopy and basket stone removal right ureter  02/2006   Dr. Terance Hart   GALLBLADDER SURGERY  12/22/2019   [place]   left total hip replacement  2004   Dr. Gladstone Lighter   melanoma removed from medial rleft knee area  2006   Dr. [place] Crane   NASAL SEPTUM SURGERY      Prior to Admission medications   Medication Sig Start Date End Date Taking? Authorizing Provider  albuterol (PROAIR HFA) 108 (90  Base) MCG/ACT inhaler Inhale 2 puffs into the lungs every 6 (six) hours as needed for wheezing. 09/10/18   Noralee Space, MD  albuterol (PROVENTIL) (2.5 MG/3ML) 0.083% nebulizer solution Take 3 mLs (2.5 mg total) by nebulization every 6 (six) hours as needed. 09/10/18 07/09/29  Noralee Space, MD  alendronate (FOSAMAX) 70 MG tablet TAKE 1 TABLET EVERY 7 (SEVEN) DAYS. TAKE WITH A FULL GLASS OF WATER ON AN EMPTY STOMACH. 05/06/21   Koberlein, Andris Flurry C, MD  amLODipine (NORVASC) 10 MG tablet TAKE 1 TABLET BY MOUTH EVERY DAY 07/02/21   Caren Macadam, MD  Ascorbic Acid (VITAMIN C) 500 MG tablet Take 500 mg by mouth daily.    [provider]  aspirin 81 MG tablet Take 81 mg by mouth daily.    [provider]  Aspirin-Acetaminophen-Caffeine (EXCEDRIN PO) Take by mouth as needed.    [provider]  azelastine (ASTELIN) 0.1 % nasal spray Instill 2 sprays into each nostril two times a day as needed for allergies 02/03/21   Scot Jun, FNP  Bempedoic Acid (NEXLETOL) 180 MG TABS Take 1 tablet by mouth daily. 06/08/20   Dorothy Spark, MD  budesonide-formoterol [facility]) 160-4.5 MCG/ACT inhaler Inhale 2 puffs into the lungs 2 (two) times daily. 06/18/21   Caren Macadam, MD  calcium carbonate (OSCAL) 1500 (600 Ca) MG TABS tablet Take 1 tablet by mouth daily.    [provider]  Cholecalciferol (VITAMIN D3) 5000 UNITS CAPS Take 5,000 Units by mouth daily.    [provider]  Cinnamon 500 MG TABS Take 1 tablet by mouth daily.     [provider]  clobetasol (TEMOVATE) 0.05 % external solution Apply topically. 06/08/20   [provider]  Coenzyme Q10 (CO Q-10) 200 MG CAPS Take 200 mg by mouth daily. 05/25/18   Sueanne Margarita, MD  desmopressin (DDAVP) 0.2 MG tablet Take 0.6 mg by mouth at bedtime.     [provider]  EPINEPHrine 0.3 mg/0.3 mL IJ SOAJ injection See admin instructions. 12/21/18   [provider]  fexofenadine  (ALLEGRA) 180 MG tablet Take 180 mg by mouth daily.    [provider]  furosemide (LASIX) 20 MG tablet Take 20 mg by mouth as needed.    [provider]  hydrALAZINE (APRESOLINE) 50 MG tablet TAKE 1 TAB 3 TIMES A DAY AND TAKE EXTRA TAB IF SBP>160 03/14/21   Bhagat, Crista Luria, PA  hydrOXYzine (ATARAX/VISTARIL) 10 MG tablet Take 10 mg by mouth at bedtime.     [provider]  losartan (COZAAR) 100 MG tablet TAKE 1 TABLET BY MOUTH EVERY DAY 07/02/21   Koberlein, Andris Flurry C, MD  magnesium oxide (MAG-OX) 400 MG tablet Take 400 mg by mouth daily.    [provider]  Multiple Vitamins-Minerals (ICAPS AREDS 2 PO)  Take by mouth daily.    [provider]  omeprazole (PRILOSEC) 40 MG capsule TAKE 1 CAPSULE BY MOUTH EVERY DAY. 07/12/20   Caren Macadam, MD  pentosan polysulfate (ELMIRON) 100 MG capsule Take 100 mg by mouth daily.    [provider]  prazosin (MINIPRESS) 5 MG capsule *NEED OFFICE VISIT* TAKE 1 CAPSULE BY MOUTH TWICE A DAY 03/13/21   Koberlein, Steele Berg, MD  Probiotic Product (PROBIOTIC PO) Take 1 capsule by mouth daily.     [provider]  UNABLE TO FIND Med Name: JUICE PLUS TAKES 6 CAPS PER DAY    [provider]  UNABLE TO FIND Med Name: NOPOLEA 2 OUNCES PER DAY    [provider]  UNABLE TO FIND Med Name: [place] 2 PER DAY    [provider]  valACYclovir (VALTREX) 1000 MG tablet Take 1,000 mg by mouth as needed (FEVER BLISTER).  07/28/19   [provider]    Allergies  Allergen Reactions   Celebrex [Celecoxib] Diarrhea   Cephalexin     Reaction was a high fever   Hydrochlorothiazide     hyponatremia   Penicillins Hives and Swelling    Swelling of arms & face Has patient had a PCN reaction causing immediate rash, facial/tongue/throat swelling, SOB or lightheadedness with hypotension: Yes Has patient had a PCN reaction causing severe rash involving mucus membranes or skin  necrosis: No Has patient had a PCN reaction that required hospitalization: No Has patient had a PCN reaction occurring within the last 10 years: No If all of the above answers are "NO", then may proceed with Cephalosporin use.    Phenobarbital Hives   Rosuvastatin Other (See Comments)    Reports causes lower extremity muscle aches   Tape Rash    MEDICAL TAPE    Social History   Socioeconomic History   Marital status: Married    Spouse name: Edison Nasuti   Number of children: 2   Years of education: Not on file   Highest education level: Not on file  Occupational History   Occupation: retired    Fish farm manager: RETIRED  Tobacco Use   Smoking status: Never   Smokeless tobacco: Never  Vaping Use   Vaping Use: Never used  Substance and Sexual Activity   Alcohol use: Yes    Alcohol/week: 1.0 standard drink    Types: 1 Standard drinks or equivalent per week    Comment:  OCC WINE   Drug use: No   Sexual activity: Not Currently    Birth control/protection: Post-menopausal    Comment: 1st intercourse 75 yo-1 partner  Other Topics Concern   Not on file  Social History Narrative   Updated 12/13/2019   Work or School: none      Home Situation: lives with husband      Spiritual Beliefs: christian      Lifestyle: regular exercise; healthy diet      12/13/2019: Uses treadmill 3/x weekly.    Looks after sister who has been having memory decline, as well  as husband with minor memory decline.    Enjoys writing poetry, reading, going to church, travel   Social Determinants of Radio broadcast assistant Strain: Low Risk    Difficulty of Paying Living Expenses: Not hard at all  Food Insecurity: No Food Insecurity   Worried About Charity fundraiser in the Last Year: Never true   Arboriculturist in the Last Year: Never true  Transportation Needs: No Data processing manager (Medical): No   Lack of Transportation (Non-Medical): No  Physical Activity: Inactive   Days  of Exercise per Week: 0 days   Minutes of Exercise per Session: 0 min  Stress: No Stress Concern Present   Feeling of Stress : Not at all  Social Connections: Moderately Integrated   Frequency of Communication with Friends and Family: More than three times a week   Frequency of Social Gatherings with Friends and Family: More than three times a week   Attends Religious Services: More than 4 times per year   Active Member of [employer] or Organizations: No   Attends Music therapist: Never   Marital Status: Married  Human resources officer Violence: Not At Risk   Fear of Current or Ex-Partner: No   Emotionally Abused: No   Physically Abused: No   Sexually Abused: No    Tobacco Use: Low Risk    Smoking Tobacco Use: Never   Smokeless Tobacco Use: Never   Social History   Substance and Sexual Activity  Alcohol Use Yes   Alcohol/week: 1.0 standard drink   Types: 1 Standard drinks or equivalent per week   Comment:  [place]    Family History  Problem Relation Age of Onset   Pancreatic cancer Father 64   Diabetes Mother    Heart disease Mother    Cancer Brother 50       Bile duct   Diabetes Brother    Hypertension Brother    Diabetes Brother    Heart disease Brother    Hypertension Brother    Hypertension Sister    Hypertension Sister    Colon cancer Paternal Uncle        dx 36s   Lung cancer Paternal Uncle        dx 79s    Review of Systems  Constitutional:  Negative for chills and fever.  HENT:  Negative for congestion, sore throat and tinnitus.   Eyes:  Negative for double vision, photophobia and pain.  Respiratory:  Negative for cough, shortness of breath and wheezing.   Cardiovascular:  Negative for chest pain, palpitations and orthopnea.  Gastrointestinal:  Negative for heartburn, nausea and vomiting.  Genitourinary:  Negative for dysuria, frequency and urgency.  Musculoskeletal:  Positive for joint pain.  Neurological:  Negative for dizziness, weakness and  headaches.    Objective:  Physical Exam: Well nourished and well developed.  General: Alert and oriented x3, cooperative and pleasant, no acute distress.  Head: normocephalic, atraumatic, neck supple.  Eyes: EOMI.  Respiratory: breath sounds clear in all fields, no wheezing, rales, or rhonchi. Cardiovascular: Regular rate and rhythm, no murmurs, gallops or rubs.  Abdomen: non-tender to palpation and soft, normoactive bowel sounds. Musculoskeletal:  Right Hip Exam:  The range of motion: Flexion to 100 degrees, Internal Rotation to 0 degrees, External Rotation to 10 to 20 degrees, and abduction to 20 degrees without discomfort.  There is no tenderness over the greater trochanteric bursa.    Calves soft and nontender. Motor function intact in LE. Strength 5/5 LE bilaterally. Neuro: Distal pulses 2+. Sensation to light touch intact in LE.  Imaging Review Plain radiographs demonstrate severe degenerative joint disease of the right hip. The bone quality appears to be adequate for age and reported activity level.  Assessment/Plan:  End stage arthritis, right hip  The patient history, physical examination, clinical judgement of the provider and imaging studies are consistent with end  stage degenerative joint disease of the right hip and total hip arthroplasty is deemed medically necessary. The treatment options including medical management, injection therapy, arthroscopy and arthroplasty were discussed at length. The risks and benefits of total hip arthroplasty were presented and reviewed. The risks due to aseptic loosening, infection, stiffness, dislocation/subluxation, thromboembolic complications and other imponderables were discussed. The patient acknowledged the explanation, agreed to proceed with the plan and consent was signed. Patient is being admitted for inpatient treatment for surgery, pain control, PT, OT, prophylactic antibiotics, VTE prophylaxis, progressive ambulation and ADLs  and discharge planning.The patient is planning to be discharged  home .   Patient's anticipated LOS is less than 2 midnights, meeting these requirements: - Lives within 1 hour of care - Has a competent adult at home to recover with post-op recover - NO history of  - Chronic pain requiring opioids  - Diabetes  - Heart failure  - Heart attack  - Stroke  - DVT/VTE  - Cardiac arrhythmia  - Respiratory Failure/COPD  - Renal failure  - Anemia  - Advanced Liver disease  Therapy Plans: HEP Disposition: Home with husband Planned DVT Prophylaxis: Xarelto 10 mg QD DME Needed: None PCP: Micheline Rough, MD (clearance received) Cardiologist: Gwyndolyn Kaufman, MD (nuclear stress test on 8/3 - notes in EPIC) TXA: IV Allergies: PCN (rash, swelling), cephalexin (fever), adhesive tape Anesthesia Concerns: None BMI: 32.8 Last HgbA1c: Not diabetic Pharmacy: [facility]  Other: - Hx mild aortic stenosis, CAD, malignant melanoma  - Patient was instructed on what medications to stop prior to surgery. - Follow-up visit in 2 weeks with Dr. Wynelle Link - Begin physical therapy following surgery - Pre-operative lab work as pre-surgical testing - Prescriptions will be provided in hospital at time of discharge  Theresa Duty, PA-C Orthopedic Surgery [facility] [place]

## 2021-07-11 DIAGNOSIS — J301 Allergic rhinitis due to pollen: Secondary | ICD-10-CM | POA: Diagnosis not present

## 2021-07-11 DIAGNOSIS — J3089 Other allergic rhinitis: Secondary | ICD-10-CM | POA: Diagnosis not present

## 2021-07-13 ENCOUNTER — Other Ambulatory Visit: Payer: Self-pay | Admitting: Physician Assistant

## 2021-07-13 ENCOUNTER — Other Ambulatory Visit: Payer: Self-pay | Admitting: Family Medicine

## 2021-07-13 MED ORDER — NEXLETOL 180 MG PO TABS: 1.0000 | ORAL_TABLET | Freq: Every day | ORAL | 1 refills | Status: DC

## 2021-07-16 MED ORDER — HYDRALAZINE HCL 50 MG PO TABS: ORAL_TABLET | ORAL | 2 refills | Status: DC

## 2021-07-17 NOTE — Patient Instructions (Signed)
DUE TO COVID-19 ONLY ONE VISITOR IS ALLOWED TO COME WITH YOU AND STAY IN THE WAITING ROOM ONLY DURING PRE OP AND PROCEDURE.   **NO VISITORS ARE ALLOWED IN THE SHORT STAY AREA OR RECOVERY ROOM!!**  IF YOU WILL BE ADMITTED INTO THE HOSPITAL YOU ARE ALLOWED ONLY TWO SUPPORT PEOPLE DURING VISITATION HOURS ONLY (10AM -8PM)   The support person(s) may change daily. The support person(s) must pass our screening, gel in and out, and wear a mask at all times, including in the patient's room. Patients must also wear a mask when staff or their support person are in the room.  No visitors under the age of 32. Any visitor under the age of 32 must be accompanied by an adult.    COVID SWAB TESTING MUST BE COMPLETED ON:  07/30/21 **MUST PRESENT COMPLETED FORM AT TESTING SITE**    [place] (backside of the building) You are not required to quarantine, however you are required to wear a well-fitted mask when you are out and around people not in your household.  Hand Hygiene often Do NOT share personal items Notify your provider if you are in close contact with someone who has COVID or you develop fever 100.4 or greater, new onset of sneezing, cough, sore throat, shortness of breath or body aches.  [place], [address], must go inside of the hospital, NOT A DRIVE THRU!  (Must self quarantine after testing. Follow instructions on handout.)       Your procedure is scheduled on: 08/01/21   Report to [facility] Main  Entrance    Report to admitting at: 12:15  PM   Call this number if you have problems the morning of surgery [phone]   Do not eat food :After Midnight.   May have liquids until: 12:00 PM.    day of surgery  CLEAR LIQUID DIET  Foods Allowed                                                                     Foods Excluded  Water, Black Coffee and tea, regular and decaf                              liquids that you cannot  Plain Jell-O in any flavor  (No red)                                           see through such as: Fruit ices (not with fruit pulp)                                     milk, soups, orange juice              Iced Popsicles (No red)  All solid food                                   Apple juices Sports drinks like Gatorade (No red) Lightly seasoned clear broth or consume(fat free) Sugar,   Sample Menu Breakfast                                Lunch                                     Supper Cranberry juice                    Beef broth                            Chicken broth Jell-O                                     Grape juice                           Apple juice Coffee or tea                        Jell-O                                      Popsicle                                                Coffee or tea                        Coffee or tea    Complete one Ensure drink the morning of surgery at: 12:00 PM       the day of surgery.   The day of surgery:  Drink ONE (1) Pre-Surgery Clear Ensure or G2 by am the morning of surgery. Drink in one sitting. Do not sip.  This drink was given to you during your hospital  pre-op appointment visit. Nothing else to drink after completing the  Pre-Surgery Clear Ensure or G2.          If you have questions, please contact your surgeon's office.     Oral Hygiene is also important to reduce your risk of infection.                                    Remember - BRUSH YOUR TEETH THE MORNING OF SURGERY WITH YOUR REGULAR TOOTHPASTE   Do NOT smoke after Midnight   Take these medicines the morning of surgery with A SIP OF WATER: allegra,apresoline,amlodipine,pentosan,omeprazole.  DO NOT TAKE ANY ORAL DIABETIC MEDICATIONS DAY OF YOUR SURGERY  You may not have any metal on your body including hair pins, jewelry, and body piercing             Do not  wear make-up, lotions, powders, perfumes/cologne, or deodorant  Do not wear nail polish including gel and S&S, artificial/acrylic nails, or any other type of covering on natural nails including finger and toenails. If you have artificial nails, gel coating, etc. that needs to be removed by a nail salon please have this removed prior to surgery or surgery may need to be canceled/ delayed if the surgeon/ anesthesia feels like they are unable to be safely monitored.   Do not shave  48 hours prior to surgery.    Do not bring valuables to the hospital. [place].   Contacts, dentures or bridgework may not be worn into surgery.   Bring small overnight bag day of surgery.    Patients discharged the day of surgery will not be allowed to drive home.   Special Instructions: Bring a copy of your healthcare power of attorney and living will documents         the day of surgery if you haven't scanned them in before.              Please read over the following fact sheets you were given: IF YOU HAVE QUESTIONS ABOUT YOUR PRE OP INSTRUCTIONS PLEASE CALL [phone]   [facility] - Preparing for Surgery Before surgery, you can play an important role.  Because skin is not sterile, your skin needs to be as free of germs as possible.  You can reduce the number of germs on your skin by washing with CHG (chlorahexidine gluconate) soap before surgery.  CHG is an antiseptic cleaner which kills germs and bonds with the skin to continue killing germs even after washing. Please DO NOT use if you have an allergy to CHG or antibacterial soaps.  If your skin becomes reddened/irritated stop using the CHG and inform your nurse when you arrive at Short Stay. Do not shave (including legs and underarms) for at least 48 hours prior to the first CHG shower.  You may shave your face/neck. Please follow these instructions carefully:  1.  Shower with CHG Soap the night before  surgery and the  morning of Surgery.  2.  If you choose to wash your hair, wash your hair first as usual with your  normal  shampoo.  3.  After you shampoo, rinse your hair and body thoroughly to remove the  shampoo.                           4.  Use CHG as you would any other liquid soap.  You can apply chg directly  to the skin and wash                       Gently with a scrungie or clean washcloth.  5.  Apply the CHG Soap to your body ONLY FROM THE NECK DOWN.   Do not use on face/ open                           Wound or open sores. Avoid contact with eyes, ears mouth and genitals (private parts).  Wash face,  Genitals (private parts) with your normal soap.             6.  Wash thoroughly, paying special attention to the area where your surgery  will be performed.  7.  Thoroughly rinse your body with warm water from the neck down.  8.  DO NOT shower/wash with your normal soap after using and rinsing off  the CHG Soap.                9.  Pat yourself dry with a clean towel.            10.  Wear clean pajamas.            11.  Place clean sheets on your bed the night of your first shower and do not  sleep with pets. Day of Surgery : Do not apply any lotions/deodorants the morning of surgery.  Please wear clean clothes to the hospital/surgery center.  FAILURE TO FOLLOW THESE INSTRUCTIONS MAY RESULT IN THE CANCELLATION OF YOUR SURGERY PATIENT SIGNATURE_________________________________  NURSE SIGNATURE__________________________________  ________________________________________________________________________   Adam Phenix  An incentive spirometer is a tool that can help keep your lungs clear and active. This tool measures how well you are filling your lungs with each breath. Taking long deep breaths may help reverse or decrease the chance of developing breathing (pulmonary) problems (especially infection) following: A long period of time when you are unable to  move or be active. BEFORE THE PROCEDURE  If the spirometer includes an indicator to show your best effort, your nurse or respiratory therapist will set it to a desired goal. If possible, sit up straight or lean slightly forward. Try not to slouch. Hold the incentive spirometer in an upright position. INSTRUCTIONS FOR USE  Sit on the edge of your bed if possible, or sit up as far as you can in bed or on a chair. Hold the incentive spirometer in an upright position. Breathe out normally. Place the mouthpiece in your mouth and seal your lips tightly around it. Breathe in slowly and as deeply as possible, raising the piston or the ball toward the top of the column. Hold your breath for 3-5 seconds or for as long as possible. Allow the piston or ball to fall to the bottom of the column. Remove the mouthpiece from your mouth and breathe out normally. Rest for a few seconds and repeat Steps 1 through 7 at least 10 times every 1-2 hours when you are awake. Take your time and take a few normal breaths between deep breaths. The spirometer may include an indicator to show your best effort. Use the indicator as a goal to work toward during each repetition. After each set of 10 deep breaths, practice coughing to be sure your lungs are clear. If you have an incision (the cut made at the time of surgery), support your incision when coughing by placing a pillow or rolled up towels firmly against it. Once you are able to get out of bed, walk around indoors and cough well. You may stop using the incentive spirometer when instructed by your caregiver.  RISKS AND COMPLICATIONS Take your time so you do not get dizzy or light-headed. If you are in pain, you may need to take or ask for pain medication before doing incentive spirometry. It is harder to take a deep breath if you are having pain. AFTER USE Rest and breathe slowly and easily. It can be helpful to keep track of  a log of your progress. Your caregiver can  provide you with a simple table to help with this. If you are using the spirometer at home, follow these instructions: [place] IF:  You are having difficultly using the spirometer. You have trouble using the spirometer as often as instructed. Your pain medication is not giving enough relief while using the spirometer. You develop fever of 100.5 F (38.1 C) or higher. SEEK IMMEDIATE MEDICAL CARE IF:  You cough up bloody sputum that had not been present before. You develop fever of 102 F (38.9 C) or greater. You develop worsening pain at or near the incision site. MAKE SURE YOU:  Understand these instructions. Will watch your condition. Will get help right away if you are not doing well or get worse. Document Released: 03/03/2007 Document Revised: 01/13/2012 Document Reviewed: 05/04/2007 [facility] Patient Information 2014 [place], [place].   ________________________________________________________________________

## 2021-07-18 ENCOUNTER — Encounter (HOSPITAL_COMMUNITY)
Admission: RE | Admit: 2021-07-18 | Discharge: 2021-07-18 | Disposition: A | Discharge status: Home / Self Care | Payer: PPO | Source: Ambulatory Visit | Attending: Orthopedic Surgery | Admitting: Orthopedic Surgery

## 2021-07-18 ENCOUNTER — Encounter (HOSPITAL_COMMUNITY): Payer: Self-pay

## 2021-07-18 ENCOUNTER — Other Ambulatory Visit: Payer: Self-pay

## 2021-07-18 ENCOUNTER — Telehealth: Payer: Self-pay | Admitting: Family Medicine

## 2021-07-18 DIAGNOSIS — Z01812 Encounter for preprocedural laboratory examination: Secondary | ICD-10-CM | POA: Diagnosis not present

## 2021-07-18 HISTORY — DX: Pneumonia, unspecified organism: J18.9

## 2021-07-18 HISTORY — DX: Atherosclerotic heart disease of native coronary artery without angina pectoris: I25.10

## 2021-07-18 HISTORY — DX: Personal history of urinary calculi: Z87.442

## 2021-07-18 LAB — COMPREHENSIVE METABOLIC PANEL
ALT: 15 U/L (ref 0–44)
AST: 19 U/L (ref 15–41)
Albumin: 4.2 g/dL (ref 3.5–5.0)
Alkaline Phosphatase: 32 U/L — ABNORMAL LOW (ref 38–126)
Anion gap: 10 (ref 5–15)
BUN: 18 mg/dL (ref 8–23)
CO2: 25 mmol/L (ref 22–32)
Calcium: 9.4 mg/dL (ref 8.9–10.3)
Chloride: 101 mmol/L (ref 98–111)
Creatinine, Ser: 0.73 mg/dL (ref 0.44–1.00)
GFR, Estimated: >60 mL/min (ref >60)
Glucose, Bld: 101 mg/dL — ABNORMAL HIGH (ref 70–99)
Potassium: 3.6 mmol/L (ref 3.5–5.1)
Sodium: 136 mmol/L (ref 135–145)
Total Bilirubin: 0.7 mg/dL (ref 0.3–1.2)
Total Protein: 6.7 g/dL (ref 6.5–8.1)

## 2021-07-18 LAB — SURGICAL PCR SCREEN
MRSA, PCR: NEGATIVE
Staphylococcus aureus: NEGATIVE

## 2021-07-18 LAB — CBC
HCT: 36 % (ref 36.0–46.0)
Hemoglobin: 11.8 g/dL — ABNORMAL LOW (ref 12.0–15.0)
MCH: 29.8 pg (ref 26.0–34.0)
MCHC: 32.8 g/dL (ref 30.0–36.0)
MCV: 90.9 fL (ref 80.0–100.0)
Platelets: 224 10*3/uL (ref 150–400)
RBC: 3.96 MIL/uL (ref 3.87–5.11)
RDW: 13.5 % (ref 11.5–15.5)
WBC: 5.9 10*3/uL (ref 4.0–10.5)
nRBC: 0 % (ref 0.0–0.2)

## 2021-07-18 LAB — PROTIME-INR
INR: 1.1 (ref 0.8–1.2)
Prothrombin Time: 13.7 seconds (ref 11.4–15.2)

## 2021-07-18 LAB — TYPE AND SCREEN
ABO/RH(D): B POS
Antibody Screen: NEGATIVE

## 2021-07-18 NOTE — Chronic Care Management (AMB) (Signed)
  Chronic Care Management   Outreach Note  07/18/2021 Name: Brianna Mccarty MRN: [id number] DOB: 1941/09/02  Referred by: Caren Macadam, MD Reason for referral : No chief complaint on file.   An unsuccessful telephone outreach was attempted today. The patient was referred to the pharmacist for assistance with care management and care coordination.   Follow Up Plan:   Tatjana Dellinger Upstream Scheduler

## 2021-07-18 NOTE — Progress Notes (Signed)
COVID Vaccine Completed: Yes Date COVID Vaccine completed: 03/22/21 x 3 COVID vaccine manufacturer: Pfizer     COVID Test: 07/30/21  PCP - Dr. Micheline Rough. Cardiologist - Dr. Gwyndolyn Kaufman. LOV: 03/14/21. : Clearance: 06/07/21 : EPIC  Chest x-ray - 08/15/20 EKG - 03/14/21 Stress Test - 06/06/21 ECHO - 04/23/21 Cardiac Cath -  Pacemaker/ICD device last checked:  Sleep Study - Yes CPAP - NO. Uses dental device.  Fasting Blood Sugar -  Checks Blood Sugar _____ times a day  Blood Thinner Instructions:Dr. Aluisio. Aspirin Instructions:Hold 7 days before. Last Dose:  Anesthesia review: Hx: Murmur,CAD,mitral valve disease,OSA(No CPAP)  Patient denies shortness of breath, fever, cough and chest pain at PAT appointment   Patient verbalized understanding of instructions that were given to them at the PAT appointment. Patient was also instructed that they will need to review over the PAT instructions again at home before surgery.

## 2021-07-18 NOTE — Progress Notes (Signed)
  Chronic Care Management   Note  07/18/2021 Name: Brianna Mccarty MRN: [id number] DOB: 01-12-1941  Brianna Mccarty is a 80 y.o. year old female who is a primary care patient of Koberlein, Steele Berg, MD. I reached out to [name] by phone today in response to a referral sent by [name]'s PCP, Caren Macadam, MD.   [name] was given information about Chronic Care Management services today including:  CCM service includes personalized support from designated clinical staff supervised by her physician, including individualized plan of care and coordination with other care providers 24/7 contact phone numbers for assistance for urgent and routine care needs. Service will only be billed when office clinical staff spend 20 minutes or more in a month to coordinate care. Only one practitioner may furnish and bill the service in a calendar month. The patient may stop CCM services at any time (effective at the end of the month) by phone call to the office staff.   Patient agreed to services and verbal consent obtained.   Follow up plan:   Tatjana Secretary/administrator

## 2021-07-19 DIAGNOSIS — J301 Allergic rhinitis due to pollen: Secondary | ICD-10-CM | POA: Diagnosis not present

## 2021-07-19 DIAGNOSIS — J3089 Other allergic rhinitis: Secondary | ICD-10-CM | POA: Diagnosis not present

## 2021-07-23 NOTE — Anesthesia Preprocedure Evaluation (Addendum)
Anesthesia Evaluation  Patient identified by MRN, date of birth, ID band Patient awake    Reviewed: Allergy & Precautions, NPO status , Patient's Chart, lab work & pertinent test results  Airway Mallampati: II  TM Distance: >3 FB Neck ROM: Full    Dental no notable dental hx. (+) Dental Advisory Given   Pulmonary asthma , sleep apnea ,    Pulmonary exam normal        Cardiovascular hypertension, + Valvular Problems/Murmurs AS  Rhythm:Regular Rate:Normal + Systolic murmurs Cardiology ordered stress test for preoperative risk stratification.  Stress test 06/06/21 low risk study.  Per results, "Reasonable to proceed to hip surgery as per Dr. Jacolyn Reedy last note; we should let let her surgeon know (Dr. Gaynelle Arabian)"  Stress Test 06/06/2021  ? The left ventricular ejection fraction is hyperdynamic (>65%). ? Nuclear stress EF: 74%. ? There was no ST segment deviation noted during stress. ? The study is normal. ? This is a low risk study.  1. Fixed apical inferior perfusion defect with normal wall motion, consistent with artifact 2. Low risk study  IMPRESSIONS    1. Left ventricular ejection fraction, by estimation, is 60 to 65%. The  left ventricle has normal function. The left ventricle has no regional  wall motion abnormalities. There is mild concentric left ventricular  hypertrophy. Left ventricular diastolic  parameters are consistent with Grade II diastolic dysfunction  (pseudonormalization). Elevated left ventricular end-diastolic pressure.  2. Right ventricular systolic function is normal. The right ventricular  size is normal. There is normal pulmonary artery systolic pressure.  3. Left atrial size was severely dilated.  4. Right atrial size was mildly dilated.  5. The mitral valve is abnormal. Mild mitral valve regurgitation. No  evidence of mitral stenosis. Severe mitral annular calcification.  6. The aortic  valve is calcified. There is moderate calcification of the  aortic valve. There is moderate thickening of the aortic valve. Aortic  valve regurgitation is trivial. Mild aortic valve stenosis.  7. Aortic dilatation noted. There is mild dilatation of the ascending  aorta, measuring 40 mm.  8. The inferior vena cava is normal in size with greater than 50%  respiratory variability, suggesting right atrial pressure of 3 mmHg.   Comparison(s): No significant change from prior study.   Conclusion(s)/Recommendation(s): Otherwise normal echocardiogram, with  minor abnormalities described in the report.    Neuro/Psych negative neurological ROS  negative psych ROS   GI/Hepatic Neg liver ROS, GERD  ,  Endo/Other  negative endocrine ROS  Renal/GU negative Renal ROS  negative genitourinary   Musculoskeletal negative musculoskeletal ROS (+)   Abdominal   Peds negative pediatric ROS (+)  Hematology negative hematology ROS (+)   Anesthesia Other Findings   Reproductive/Obstetrics negative OB ROS                            Anesthesia Physical Anesthesia Plan  ASA: 3  Anesthesia Plan: Spinal and MAC   Post-op Pain Management:    Induction:   PONV Risk Score and Plan: 3 and Ondansetron, Dexamethasone and Propofol infusion  Airway Management Planned: Natural Airway  Additional Equipment:   Intra-op Plan:   Post-operative Plan:   Informed Consent:     Dental advisory given  Plan Discussed with: Anesthesiologist and CRNA  Anesthesia Plan Comments: (See PAT note 07/18/2021, Konrad Felix Ward, PA-C)       Anesthesia Quick Evaluation

## 2021-07-23 NOTE — Progress Notes (Signed)
Anesthesia Chart Review   Case: [id number] Date/Time: 08/01/21 1445   Procedure: TOTAL HIP ARTHROPLASTY ANTERIOR APPROACH (Right: Hip)   Anesthesia type: Choice   Pre-op diagnosis: right hip osteoarthritis   Location: [facility]   Surgeons: Gaynelle Arabian, MD       DISCUSSION:80 y.o. never smoker with h/o HTN, GERD, asthma, OSA, CAD, mild aortic stenosis, right hip OA scheduled for above procedure 08/01/2021 with Dr. Gaynelle Arabian.   Pt seen by PCP 06/29/2021 for preoperative evaluation.  Per OV note, "No contraindications to planned surgery".   Cardiology ordered stress test for preoperative risk stratification.  Stress test 06/06/21 low risk study.  Per results, "Reasonable to proceed to hip surgery as per Dr. Jacolyn Reedy last note; we should let let her surgeon know (Dr. Gaynelle Arabian)"  Anticipate pt can proceed with planned procedure barring acute status change.   VS: BP (!) 145/76   Pulse 66   Temp 36.7 C (Oral)   Ht '5\' 1"'$  (1.549 m)   Wt 79.4 kg   SpO2 96%   BMI 33.07 kg/m   PROVIDERS: Caren Macadam, MD is PCP   Gwyndolyn Kaufman, MD is Cardiologist  LABS: Labs reviewed: Acceptable for surgery. (all labs ordered are listed, but only abnormal results are displayed)  Labs Reviewed  CBC - Abnormal; Notable for the following components:      Result Value   Hemoglobin 11.8 (*)    All other components within normal limits  COMPREHENSIVE METABOLIC PANEL - Abnormal; Notable for the following components:   Glucose, Bld 101 (*)    Alkaline Phosphatase 32 (*)    All other components within normal limits  SURGICAL PCR SCREEN  PROTIME-INR  TYPE AND SCREEN     IMAGES:   EKG: 03/14/21 Rate 65 bpm  NSR  CV: Stress Test 06/06/2021   The left ventricular ejection fraction is hyperdynamic (>65%). Nuclear stress EF: 74%. There was no ST segment deviation noted during stress. The study is normal. This is a low risk study.   1. Fixed apical inferior  perfusion defect with normal wall motion, consistent with artifact 2. Low risk study   Echo 04/23/2021 1. Left ventricular ejection fraction, by estimation, is 60 to 65%. The  left ventricle has normal function. The left ventricle has no regional  wall motion abnormalities. There is mild concentric left ventricular  hypertrophy. Left ventricular diastolic  parameters are consistent with Grade II diastolic dysfunction  (pseudonormalization). Elevated left ventricular end-diastolic pressure.   2. Right ventricular systolic function is normal. The right ventricular  size is normal. There is normal pulmonary artery systolic pressure.   3. Left atrial size was severely dilated.   4. Right atrial size was mildly dilated.   5. The mitral valve is abnormal. Mild mitral valve regurgitation. No  evidence of mitral stenosis. Severe mitral annular calcification.   6. The aortic valve is calcified. There is moderate calcification of the  aortic valve. There is moderate thickening of the aortic valve. Aortic  valve regurgitation is trivial. Mild aortic valve stenosis.   7. Aortic dilatation noted. There is mild dilatation of the ascending  aorta, measuring 40 mm.   8. The inferior vena cava is normal in size with greater than 50%  respiratory variability, suggesting right atrial pressure of 3 mmHg Past Medical History:  Diagnosis Date   Abnormal EKG    Normal LV function in the past   Allergic rhinitis    Asthma  Bronchitis, mucopurulent recurrent (HCC)    Carotid artery disease (HCC)    a. mild by carotid duplex.   Cervical dysplasia 1971   Coronary artery disease    Diverticulosis of colon    DJD (degenerative joint disease)    Family history of cancer of extrahepatic bile ducts 09/05/2020   Family history of colon cancer 09/05/2020   Family history of pancreatic cancer 09/05/2020   GERD (gastroesophageal reflux disease)    Headache(784.0)    History of kidney stones     Hypercholesterolemia    Hypertension    Interstitial cystitis    sees urologist   Lichen sclerosus    Malignant melanoma ([place]) 2006   sees Dr. [place] Crane in dermatology   Migraines    Mild aortic stenosis    Mitral valve disease    Question mitral valve prolapse in the past, no prolapse by echo 2009   Murmur 10/20/2015   SEES DR NELSON   OSA (obstructive sleep apnea)    USES DENTAL DEVICE   Osteoporosis    on fosomax > 5 years, stopped 11/2015   Pneumonia    Thyroid cyst    1 x 1.1 thyroid cyst noted on carotid Doppler, January, 2012   Venous insufficiency     Past Surgical History:  Procedure Laterality Date   BREAST BIOPSY Left 11/25/2011   U/S core, benign performed at [facility], LEFT BREAT MARKER IN   BREAST CYST ASPIRATION     BREAST SURGERY  2013   Breast Bx-Benign   CARDIAC CATHETERIZATION     CATARACT EXTRACTION, BILATERAL     CHOLECYSTECTOMY N/A 12/22/2019   Procedure: LAPAROSCOPIC CHOLECYSTECTOMY;  Surgeon: Ileana Roup, MD;  Location: [facility];  Service: General;  Laterality: N/A;   cystoscopy and basket stone removal right ureter  02/2006   Dr. Robbi Garter SURGERY  12/22/2019   [place]   left total hip replacement  2004   Dr. Gladstone Lighter   melanoma removed from medial rleft knee area  2006   Dr. [place] Crane   NASAL SEPTUM SURGERY      MEDICATIONS:  acyclovir (ZOVIRAX) 400 MG tablet   albuterol (PROAIR HFA) 108 (90 Base) MCG/ACT inhaler   albuterol (PROVENTIL) (2.5 MG/3ML) 0.083% nebulizer solution   alendronate (FOSAMAX) 70 MG tablet   amLODipine (NORVASC) 10 MG tablet   Ascorbic Acid (VITAMIN C) 500 MG tablet   aspirin 81 MG tablet   aspirin-acetaminophen-caffeine (EXCEDRIN EXTRA STRENGTH) 250-250-65 MG tablet   azelastine (ASTELIN) 0.1 % nasal spray   Bempedoic Acid (NEXLETOL) 180 MG TABS   betamethasone dipropionate 0.05 % cream   budesonide-formoterol (SYMBICORT) 160-4.5 MCG/ACT inhaler   calcium carbonate  (OSCAL) 1500 (600 Ca) MG TABS tablet   Cholecalciferol (VITAMIN D3) 5000 UNITS CAPS   Cinnamon 500 MG TABS   Coenzyme Q10 (COQ-10 PO)   desmopressin (DDAVP) 0.2 MG tablet   EPINEPHrine 0.3 mg/0.3 mL IJ SOAJ injection   fexofenadine (ALLEGRA) 180 MG tablet   furosemide (LASIX) 20 MG tablet   hydrALAZINE (APRESOLINE) 50 MG tablet   hydrOXYzine (ATARAX/VISTARIL) 10 MG tablet   ibuprofen (ADVIL) 200 MG tablet   losartan (COZAAR) 100 MG tablet   Magnesium 250 MG TABS   METAMUCIL FIBER PO   montelukast (SINGULAIR) 10 MG tablet   Multiple Vitamins-Minerals (ICAPS AREDS 2 PO)   omeprazole (PRILOSEC) 40 MG capsule   OVER THE COUNTER MEDICATION   OVER THE COUNTER MEDICATION   OVER THE COUNTER MEDICATION  OVER THE COUNTER MEDICATION   OVER THE COUNTER MEDICATION   pentosan polysulfate (ELMIRON) 100 MG capsule   polyethylene glycol (MIRALAX / GLYCOLAX) 17 g packet   prazosin (MINIPRESS) 5 MG capsule   Probiotic Product (PROBIOTIC PO)   Sodium Chloride, Hypertonic, (MURO 128 OP)   No current facility-administered medications for this encounter.    Konrad Felix Ward, PA-C [facility] Pre-Surgical Testing [phone]

## 2021-07-26 DIAGNOSIS — J301 Allergic rhinitis due to pollen: Secondary | ICD-10-CM | POA: Diagnosis not present

## 2021-07-26 DIAGNOSIS — J3089 Other allergic rhinitis: Secondary | ICD-10-CM | POA: Diagnosis not present

## 2021-07-30 ENCOUNTER — Other Ambulatory Visit: Payer: Self-pay | Admitting: Orthopedic Surgery

## 2021-07-30 DIAGNOSIS — J301 Allergic rhinitis due to pollen: Secondary | ICD-10-CM | POA: Diagnosis not present

## 2021-07-30 DIAGNOSIS — J3089 Other allergic rhinitis: Secondary | ICD-10-CM | POA: Diagnosis not present

## 2021-07-31 LAB — SARS CORONAVIRUS 2 (TAT 6-24 HRS): SARS Coronavirus 2: NEGATIVE

## 2021-08-01 ENCOUNTER — Encounter (HOSPITAL_COMMUNITY): Payer: Self-pay | Admitting: Orthopedic Surgery

## 2021-08-01 ENCOUNTER — Ambulatory Visit (HOSPITAL_COMMUNITY): Payer: PPO

## 2021-08-01 ENCOUNTER — Encounter (HOSPITAL_COMMUNITY)
Admission: RE | Disposition: A | Discharge status: Home / Self Care | Payer: Self-pay | Source: Ambulatory Visit | Attending: Orthopedic Surgery

## 2021-08-01 ENCOUNTER — Ambulatory Visit (HOSPITAL_COMMUNITY): Payer: PPO | Admitting: Certified Registered Nurse Anesthetist

## 2021-08-01 ENCOUNTER — Observation Stay (HOSPITAL_COMMUNITY): Payer: PPO

## 2021-08-01 ENCOUNTER — Observation Stay (HOSPITAL_COMMUNITY)
Admission: RE | Admit: 2021-08-01 | Discharge: 2021-08-02 | Disposition: A | Discharge status: Home / Self Care | Payer: PPO | Source: Ambulatory Visit | Attending: Orthopedic Surgery | Admitting: Orthopedic Surgery

## 2021-08-01 ENCOUNTER — Ambulatory Visit (HOSPITAL_COMMUNITY): Payer: PPO | Admitting: Physician Assistant

## 2021-08-01 ENCOUNTER — Other Ambulatory Visit: Payer: Self-pay

## 2021-08-01 DIAGNOSIS — E7800 Pure hypercholesterolemia, unspecified: Secondary | ICD-10-CM | POA: Diagnosis not present

## 2021-08-01 DIAGNOSIS — Z96641 Presence of right artificial hip joint: Secondary | ICD-10-CM

## 2021-08-01 DIAGNOSIS — M169 Osteoarthritis of hip, unspecified: Secondary | ICD-10-CM

## 2021-08-01 DIAGNOSIS — Z8616 Personal history of COVID-19: Secondary | ICD-10-CM | POA: Diagnosis not present

## 2021-08-01 DIAGNOSIS — I10 Essential (primary) hypertension: Secondary | ICD-10-CM | POA: Insufficient documentation

## 2021-08-01 DIAGNOSIS — Z7982 Long term (current) use of aspirin: Secondary | ICD-10-CM | POA: Insufficient documentation

## 2021-08-01 DIAGNOSIS — M1611 Unilateral primary osteoarthritis, right hip: Secondary | ICD-10-CM | POA: Diagnosis not present

## 2021-08-01 DIAGNOSIS — Z85820 Personal history of malignant melanoma of skin: Secondary | ICD-10-CM | POA: Insufficient documentation

## 2021-08-01 DIAGNOSIS — J45909 Unspecified asthma, uncomplicated: Secondary | ICD-10-CM | POA: Diagnosis not present

## 2021-08-01 DIAGNOSIS — M25551 Pain in right hip: Secondary | ICD-10-CM

## 2021-08-01 DIAGNOSIS — Z96649 Presence of unspecified artificial hip joint: Secondary | ICD-10-CM

## 2021-08-01 DIAGNOSIS — Z471 Aftercare following joint replacement surgery: Secondary | ICD-10-CM | POA: Diagnosis not present

## 2021-08-01 DIAGNOSIS — K219 Gastro-esophageal reflux disease without esophagitis: Secondary | ICD-10-CM | POA: Diagnosis not present

## 2021-08-01 DIAGNOSIS — Z79899 Other long term (current) drug therapy: Secondary | ICD-10-CM | POA: Diagnosis not present

## 2021-08-01 DIAGNOSIS — G4733 Obstructive sleep apnea (adult) (pediatric): Secondary | ICD-10-CM | POA: Diagnosis not present

## 2021-08-01 HISTORY — PX: TOTAL HIP ARTHROPLASTY: SHX124

## 2021-08-01 LAB — ABO/RH: ABO/RH(D): B POS

## 2021-08-01 SURGERY — ARTHROPLASTY, HIP, TOTAL, ANTERIOR APPROACH
Anesthesia: Monitor Anesthesia Care | Site: Hip | Laterality: Right

## 2021-08-01 MED ORDER — SODIUM CHLORIDE (HYPERTONIC) 2 % OP SOLN
1.0000 [drp] | Freq: Every day | OPHTHALMIC | Status: DC | PRN
  Filled 2021-08-01: qty 15

## 2021-08-01 MED ORDER — PROPOFOL 10 MG/ML IV BOLUS
INTRAVENOUS | Status: DC | PRN
  Administered 2021-08-01 (×6): 10 mg via INTRAVENOUS
  Administered 2021-08-01: 20 mg via INTRAVENOUS

## 2021-08-01 MED ORDER — VANCOMYCIN HCL IN DEXTROSE 1-5 GM/200ML-% IV SOLN
1000.0000 mg | INTRAVENOUS | Status: AC
  Administered 2021-08-01: 1000 mg via INTRAVENOUS
  Filled 2021-08-01: qty 200

## 2021-08-01 MED ORDER — PANTOPRAZOLE SODIUM 40 MG PO TBEC
40.0000 mg | DELAYED_RELEASE_TABLET | Freq: Every day | ORAL | Status: DC
  Administered 2021-08-02: 40 mg via ORAL
  Filled 2021-08-01: qty 1

## 2021-08-01 MED ORDER — HYDROXYZINE HCL 10 MG PO TABS
10.0000 mg | ORAL_TABLET | Freq: Every day | ORAL | Status: DC
  Administered 2021-08-01: 10 mg via ORAL
  Filled 2021-08-01: qty 1

## 2021-08-01 MED ORDER — PHENYLEPHRINE 40 MCG/ML (10ML) SYRINGE FOR IV PUSH (FOR BLOOD PRESSURE SUPPORT)
PREFILLED_SYRINGE | INTRAVENOUS | Status: DC | PRN
  Administered 2021-08-01 (×3): 80 ug via INTRAVENOUS

## 2021-08-01 MED ORDER — VANCOMYCIN HCL IN DEXTROSE 1-5 GM/200ML-% IV SOLN
1000.0000 mg | Freq: Two times a day (BID) | INTRAVENOUS | Status: AC
  Administered 2021-08-02: 1000 mg via INTRAVENOUS
  Filled 2021-08-01: qty 200

## 2021-08-01 MED ORDER — METHOCARBAMOL 500 MG PO TABS
500.0000 mg | ORAL_TABLET | Freq: Four times a day (QID) | ORAL | Status: DC | PRN
  Administered 2021-08-01 (×2): 500 mg via ORAL
  Filled 2021-08-01 (×2): qty 1

## 2021-08-01 MED ORDER — EPHEDRINE 5 MG/ML INJ
INTRAVENOUS | Status: AC
  Filled 2021-08-01: qty 5

## 2021-08-01 MED ORDER — DESMOPRESSIN ACETATE 0.1 MG PO TABS
0.2000 mg | ORAL_TABLET | Freq: Every day | ORAL | Status: DC
  Administered 2021-08-01: 0.2 mg via ORAL
  Filled 2021-08-01: qty 2
  Filled 2021-08-01: qty 1
  Filled 2021-08-01: qty 2

## 2021-08-01 MED ORDER — TRANEXAMIC ACID-NACL 1000-0.7 MG/100ML-% IV SOLN
1000.0000 mg | INTRAVENOUS | Status: AC
  Administered 2021-08-01: 1000 mg via INTRAVENOUS
  Filled 2021-08-01: qty 100

## 2021-08-01 MED ORDER — PENTOSAN POLYSULFATE SODIUM 100 MG PO CAPS
100.0000 mg | ORAL_CAPSULE | Freq: Every day | ORAL | Status: DC
  Administered 2021-08-01: 100 mg via ORAL
  Filled 2021-08-01: qty 1

## 2021-08-01 MED ORDER — POLYETHYLENE GLYCOL 3350 17 G PO PACK: 17.0000 g | PACK | Freq: Every day | ORAL | Status: DC | PRN

## 2021-08-01 MED ORDER — RIVAROXABAN 10 MG PO TABS
10.0000 mg | ORAL_TABLET | Freq: Every day | ORAL | Status: DC
  Administered 2021-08-02: 10 mg via ORAL
  Filled 2021-08-01 (×2): qty 1

## 2021-08-01 MED ORDER — ACETAMINOPHEN 325 MG PO TABS
325.0000 mg | ORAL_TABLET | Freq: Four times a day (QID) | ORAL | Status: DC | PRN
  Administered 2021-08-02: 325 mg via ORAL
  Filled 2021-08-01: qty 1

## 2021-08-01 MED ORDER — MORPHINE SULFATE (PF) 2 MG/ML IV SOLN: 0.5000 mg | INTRAVENOUS | Status: DC | PRN

## 2021-08-01 MED ORDER — DEXAMETHASONE SODIUM PHOSPHATE 10 MG/ML IJ SOLN
INTRAMUSCULAR | Status: DC | PRN
  Administered 2021-08-01: 5 mg via INTRAVENOUS

## 2021-08-01 MED ORDER — HYDROCODONE-ACETAMINOPHEN 5-325 MG PO TABS
1.0000 | ORAL_TABLET | ORAL | Status: DC | PRN
  Administered 2021-08-01: 2 via ORAL
  Administered 2021-08-02: 1 via ORAL
  Administered 2021-08-02: 2 via ORAL
  Filled 2021-08-01: qty 1
  Filled 2021-08-01: qty 2
  Filled 2021-08-01: qty 1
  Filled 2021-08-01: qty 2

## 2021-08-01 MED ORDER — FENTANYL CITRATE (PF) 100 MCG/2ML IJ SOLN
INTRAMUSCULAR | Status: DC | PRN
  Administered 2021-08-01 (×2): 50 ug via INTRAVENOUS

## 2021-08-01 MED ORDER — ALBUTEROL SULFATE HFA 108 (90 BASE) MCG/ACT IN AERS: 2.0000 | INHALATION_SPRAY | Freq: Four times a day (QID) | RESPIRATORY_TRACT | Status: DC | PRN

## 2021-08-01 MED ORDER — LACTATED RINGERS IV SOLN: INTRAVENOUS | Status: DC

## 2021-08-01 MED ORDER — DOCUSATE SODIUM 100 MG PO CAPS
100.0000 mg | ORAL_CAPSULE | Freq: Two times a day (BID) | ORAL | Status: DC
  Administered 2021-08-01 – 2021-08-02 (×2): 100 mg via ORAL
  Filled 2021-08-01 (×2): qty 1

## 2021-08-01 MED ORDER — SODIUM CHLORIDE 0.9 % IV SOLN: INTRAVENOUS | Status: DC

## 2021-08-01 MED ORDER — EPHEDRINE SULFATE-NACL 50-0.9 MG/10ML-% IV SOSY
PREFILLED_SYRINGE | INTRAVENOUS | Status: DC | PRN
  Administered 2021-08-01: 5 mg via INTRAVENOUS

## 2021-08-01 MED ORDER — LIDOCAINE HCL (CARDIAC) PF 100 MG/5ML IV SOSY
PREFILLED_SYRINGE | INTRAVENOUS | Status: DC | PRN
  Administered 2021-08-01: 40 mg via INTRAVENOUS

## 2021-08-01 MED ORDER — FENTANYL CITRATE PF 50 MCG/ML IJ SOSY: 25.0000 ug | PREFILLED_SYRINGE | INTRAMUSCULAR | Status: DC | PRN

## 2021-08-01 MED ORDER — BISACODYL 10 MG RE SUPP: 10.0000 mg | Freq: Every day | RECTAL | Status: DC | PRN

## 2021-08-01 MED ORDER — PROPOFOL 1000 MG/100ML IV EMUL
INTRAVENOUS | Status: AC
  Filled 2021-08-01: qty 100

## 2021-08-01 MED ORDER — BUPIVACAINE-EPINEPHRINE (PF) 0.25% -1:200000 IJ SOLN
INTRAMUSCULAR | Status: AC
  Filled 2021-08-01: qty 30

## 2021-08-01 MED ORDER — PHENYLEPHRINE 40 MCG/ML (10ML) SYRINGE FOR IV PUSH (FOR BLOOD PRESSURE SUPPORT)
PREFILLED_SYRINGE | INTRAVENOUS | Status: AC
  Filled 2021-08-01: qty 10

## 2021-08-01 MED ORDER — POVIDONE-IODINE 10 % EX SWAB
2.0000 "application " | Freq: Once | CUTANEOUS | Status: AC
  Administered 2021-08-01: 2 via TOPICAL

## 2021-08-01 MED ORDER — PHENOL 1.4 % MT LIQD: 1.0000 | OROMUCOSAL | Status: DC | PRN

## 2021-08-01 MED ORDER — BEMPEDOIC ACID 180 MG PO TABS: 180.0000 mg | ORAL_TABLET | Freq: Every day | ORAL | Status: DC

## 2021-08-01 MED ORDER — MONTELUKAST SODIUM 10 MG PO TABS
10.0000 mg | ORAL_TABLET | Freq: Every day | ORAL | Status: DC
  Administered 2021-08-01: 10 mg via ORAL
  Filled 2021-08-01: qty 1

## 2021-08-01 MED ORDER — DEXAMETHASONE SODIUM PHOSPHATE 10 MG/ML IJ SOLN
10.0000 mg | Freq: Once | INTRAMUSCULAR | Status: AC
  Administered 2021-08-02: 10 mg via INTRAVENOUS
  Filled 2021-08-01: qty 1

## 2021-08-01 MED ORDER — LIDOCAINE HCL (PF) 2 % IJ SOLN
INTRAMUSCULAR | Status: AC
  Filled 2021-08-01: qty 5

## 2021-08-01 MED ORDER — LOSARTAN POTASSIUM 50 MG PO TABS: 100.0000 mg | ORAL_TABLET | Freq: Every day | ORAL | Status: DC

## 2021-08-01 MED ORDER — 0.9 % SODIUM CHLORIDE (POUR BTL) OPTIME
TOPICAL | Status: DC | PRN
  Administered 2021-08-01: 1000 mL

## 2021-08-01 MED ORDER — ONDANSETRON HCL 4 MG/2ML IJ SOLN
INTRAMUSCULAR | Status: AC
  Filled 2021-08-01: qty 2

## 2021-08-01 MED ORDER — FENTANYL CITRATE (PF) 100 MCG/2ML IJ SOLN
INTRAMUSCULAR | Status: AC
  Filled 2021-08-01: qty 2

## 2021-08-01 MED ORDER — FUROSEMIDE 20 MG PO TABS: 20.0000 mg | ORAL_TABLET | Freq: Every day | ORAL | Status: DC | PRN

## 2021-08-01 MED ORDER — ONDANSETRON HCL 4 MG PO TABS: 4.0000 mg | ORAL_TABLET | Freq: Four times a day (QID) | ORAL | Status: DC | PRN

## 2021-08-01 MED ORDER — MENTHOL 3 MG MT LOZG: 1.0000 | LOZENGE | OROMUCOSAL | Status: DC | PRN

## 2021-08-01 MED ORDER — BUPIVACAINE IN DEXTROSE 0.75-8.25 % IT SOLN
INTRATHECAL | Status: DC | PRN
  Administered 2021-08-01: 1.6 mL via INTRATHECAL

## 2021-08-01 MED ORDER — METOCLOPRAMIDE HCL 5 MG/ML IJ SOLN: 5.0000 mg | Freq: Three times a day (TID) | INTRAMUSCULAR | Status: DC | PRN

## 2021-08-01 MED ORDER — AMISULPRIDE (ANTIEMETIC) 5 MG/2ML IV SOLN: 10.0000 mg | Freq: Once | INTRAVENOUS | Status: DC | PRN

## 2021-08-01 MED ORDER — PROMETHAZINE HCL 25 MG/ML IJ SOLN: 6.2500 mg | INTRAMUSCULAR | Status: DC | PRN

## 2021-08-01 MED ORDER — ONDANSETRON HCL 4 MG/2ML IJ SOLN
INTRAMUSCULAR | Status: DC | PRN
  Administered 2021-08-01: 4 mg via INTRAVENOUS

## 2021-08-01 MED ORDER — PHENYLEPHRINE HCL-NACL 20-0.9 MG/250ML-% IV SOLN
INTRAVENOUS | Status: DC | PRN
  Administered 2021-08-01: 25 ug/min via INTRAVENOUS

## 2021-08-01 MED ORDER — DEXAMETHASONE SODIUM PHOSPHATE 10 MG/ML IJ SOLN: 8.0000 mg | Freq: Once | INTRAMUSCULAR | Status: DC

## 2021-08-01 MED ORDER — ONDANSETRON HCL 4 MG/2ML IJ SOLN: 4.0000 mg | Freq: Four times a day (QID) | INTRAMUSCULAR | Status: DC | PRN

## 2021-08-01 MED ORDER — MOMETASONE FURO-FORMOTEROL FUM 200-5 MCG/ACT IN AERO
2.0000 | INHALATION_SPRAY | Freq: Two times a day (BID) | RESPIRATORY_TRACT | Status: DC
  Administered 2021-08-01 – 2021-08-02 (×2): 2 via RESPIRATORY_TRACT
  Filled 2021-08-01: qty 8.8

## 2021-08-01 MED ORDER — BUPIVACAINE-EPINEPHRINE (PF) 0.25% -1:200000 IJ SOLN
INTRAMUSCULAR | Status: DC | PRN
  Administered 2021-08-01: 30 mL

## 2021-08-01 MED ORDER — ACETAMINOPHEN 10 MG/ML IV SOLN
1000.0000 mg | Freq: Four times a day (QID) | INTRAVENOUS | Status: DC
  Administered 2021-08-01: 1000 mg via INTRAVENOUS
  Filled 2021-08-01: qty 100

## 2021-08-01 MED ORDER — LORATADINE 10 MG PO TABS
10.0000 mg | ORAL_TABLET | Freq: Every day | ORAL | Status: DC
  Administered 2021-08-02: 10 mg via ORAL
  Filled 2021-08-01: qty 1

## 2021-08-01 MED ORDER — CHLORHEXIDINE GLUCONATE 0.12 % MT SOLN
15.0000 mL | Freq: Once | OROMUCOSAL | Status: AC
  Administered 2021-08-01: 15 mL via OROMUCOSAL

## 2021-08-01 MED ORDER — ORAL CARE MOUTH RINSE: 15.0000 mL | Freq: Once | OROMUCOSAL | Status: AC

## 2021-08-01 MED ORDER — TRAMADOL HCL 50 MG PO TABS
50.0000 mg | ORAL_TABLET | Freq: Four times a day (QID) | ORAL | Status: DC | PRN
  Administered 2021-08-01 – 2021-08-02 (×2): 100 mg via ORAL
  Filled 2021-08-01 (×2): qty 2

## 2021-08-01 MED ORDER — WATER FOR IRRIGATION, STERILE IR SOLN
Status: DC | PRN
  Administered 2021-08-01: 2000 mL

## 2021-08-01 MED ORDER — AMLODIPINE BESYLATE 10 MG PO TABS: 10.0000 mg | ORAL_TABLET | Freq: Every day | ORAL | Status: DC

## 2021-08-01 MED ORDER — METOCLOPRAMIDE HCL 5 MG PO TABS: 5.0000 mg | ORAL_TABLET | Freq: Three times a day (TID) | ORAL | Status: DC | PRN

## 2021-08-01 MED ORDER — PROPOFOL 500 MG/50ML IV EMUL
INTRAVENOUS | Status: DC | PRN
  Administered 2021-08-01: 75 ug/kg/min via INTRAVENOUS

## 2021-08-01 MED ORDER — HYDRALAZINE HCL 50 MG PO TABS: 50.0000 mg | ORAL_TABLET | Freq: Three times a day (TID) | ORAL | Status: DC | PRN

## 2021-08-01 MED ORDER — DEXAMETHASONE SODIUM PHOSPHATE 10 MG/ML IJ SOLN
INTRAMUSCULAR | Status: AC
  Filled 2021-08-01: qty 1

## 2021-08-01 MED ORDER — ALBUTEROL SULFATE (2.5 MG/3ML) 0.083% IN NEBU: 2.5000 mg | INHALATION_SOLUTION | Freq: Four times a day (QID) | RESPIRATORY_TRACT | Status: DC | PRN

## 2021-08-01 MED ORDER — METHOCARBAMOL 500 MG IVPB - SIMPLE MED
500.0000 mg | Freq: Four times a day (QID) | INTRAVENOUS | Status: DC | PRN
  Filled 2021-08-01: qty 50

## 2021-08-01 MED ORDER — AZELASTINE HCL 0.1 % NA SOLN
1.0000 | Freq: Every day | NASAL | Status: DC | PRN
  Filled 2021-08-01: qty 30

## 2021-08-01 SURGICAL SUPPLY — 44 items
BAG COUNTER SPONGE SURGICOUNT (BAG) IMPLANT
BAG DECANTER FOR FLEXI CONT (MISCELLANEOUS) IMPLANT
BAG SPEC THK2 15X12 ZIP CLS (MISCELLANEOUS)
BAG SPNG CNTER NS LX DISP (BAG)
BAG ZIPLOCK 12X15 (MISCELLANEOUS) IMPLANT
BLADE SAG 18X100X1.27 (BLADE) ×2 IMPLANT
CLSR STERI-STRIP ANTIMIC 1/2X4 (GAUZE/BANDAGES/DRESSINGS) ×1 IMPLANT
COVER PERINEAL POST (MISCELLANEOUS) ×2 IMPLANT
COVER SURGICAL LIGHT HANDLE (MISCELLANEOUS) ×2 IMPLANT
CUP ACETBLR 48 OD SECTOR II (Hips) ×1 IMPLANT
DECANTER SPIKE VIAL GLASS SM (MISCELLANEOUS) ×2 IMPLANT
DRAPE FOOT SWITCH (DRAPES) ×2 IMPLANT
DRAPE STERI IOBAN 125X83 (DRAPES) ×2 IMPLANT
DRAPE U-SHAPE 47X51 STRL (DRAPES) ×4 IMPLANT
DRSG AQUACEL AG ADV 3.5X10 (GAUZE/BANDAGES/DRESSINGS) ×2 IMPLANT
DURAPREP 26ML APPLICATOR (WOUND CARE) ×2 IMPLANT
ELECT REM PT RETURN 15FT ADLT (MISCELLANEOUS) ×2 IMPLANT
GLOVE SRG 8 PF TXTR STRL LF DI (GLOVE) ×1 IMPLANT
GLOVE SURG ENC MOIS LTX SZ6.5 (GLOVE) ×2 IMPLANT
GLOVE SURG ENC MOIS LTX SZ7 (GLOVE) ×2 IMPLANT
GLOVE SURG ENC MOIS LTX SZ8 (GLOVE) ×4 IMPLANT
GLOVE SURG UNDER POLY LF SZ7 (GLOVE) ×2 IMPLANT
GLOVE SURG UNDER POLY LF SZ8 (GLOVE) ×2
GLOVE SURG UNDER POLY LF SZ8.5 (GLOVE) IMPLANT
GOWN STRL REUS W/TWL LRG LVL3 (GOWN DISPOSABLE) ×4 IMPLANT
GOWN STRL REUS W/TWL XL LVL3 (GOWN DISPOSABLE) IMPLANT
HEAD FEM STD 28X+1.5 STRL (Hips) ×1 IMPLANT
HOLDER FOLEY CATH W/STRAP (MISCELLANEOUS) ×2 IMPLANT
KIT TURNOVER KIT A (KITS) ×2 IMPLANT
LINER ACET ALTRX NEUT +4X28X48 (Hips) ×2 IMPLANT
MANIFOLD NEPTUNE II (INSTRUMENTS) ×2 IMPLANT
PACK ANTERIOR HIP CUSTOM (KITS) ×2 IMPLANT
PENCIL SMOKE EVACUATOR COATED (MISCELLANEOUS) ×2 IMPLANT
STEM FEM ACTIS STD SZ4 (Stem) ×1 IMPLANT
STRIP CLOSURE SKIN 1/2X4 (GAUZE/BANDAGES/DRESSINGS) ×2 IMPLANT
SUT ETHIBOND NAB CT1 #1 30IN (SUTURE) ×2 IMPLANT
SUT MNCRL AB 4-0 PS2 18 (SUTURE) ×2 IMPLANT
SUT STRATAFIX 0 PDS 27 VIOLET (SUTURE) ×2
SUT VIC AB 2-0 CT1 27 (SUTURE) ×4
SUT VIC AB 2-0 CT1 TAPERPNT 27 (SUTURE) ×2 IMPLANT
SUTURE STRATFX 0 PDS 27 VIOLET (SUTURE) ×1 IMPLANT
SYR 50ML LL SCALE MARK (SYRINGE) IMPLANT
TRAY FOLEY MTR SLVR 16FR STAT (SET/KITS/TRAYS/PACK) ×2 IMPLANT
TUBE SUCTION HIGH CAP CLEAR NV (SUCTIONS) ×2 IMPLANT

## 2021-08-01 NOTE — Op Note (Signed)
OPERATIVE REPORT- TOTAL HIP ARTHROPLASTY   PREOPERATIVE DIAGNOSIS: Osteoarthritis of the Right hip.   POSTOPERATIVE DIAGNOSIS: Osteoarthritis of the Right  hip.   PROCEDURE: Right total hip arthroplasty, anterior approach.   SURGEON: Gaynelle Arabian, MD   ASSISTANT: [name], PA-C  ANESTHESIA:  Spinal  ESTIMATED BLOOD LOSS:-150 mL    DRAINS: Hemovac x1.   COMPLICATIONS: None   CONDITION: PACU - hemodynamically stable.   BRIEF CLINICAL NOTE: Brianna Mccarty is a 80 y.o. female who has advanced end-  stage arthritis of their Right  hip with progressively worsening pain and  dysfunction.The patient has failed nonoperative management and presents for  total hip arthroplasty.   PROCEDURE IN DETAIL: After successful administration of spinal  anesthetic, the traction boots for the [facility] bed were placed on both  feet and the patient was placed onto the [facility] bed, boots placed into the leg  holders. The Right hip was then isolated from the perineum with plastic  drapes and prepped and draped in the usual sterile fashion. ASIS and  greater trochanter were marked and a oblique incision was made, starting  at about 1 cm lateral and 2 cm distal to the ASIS and coursing towards  the anterior cortex of the femur. The skin was cut with a 10 blade  through subcutaneous tissue to the level of the fascia overlying the  tensor fascia lata muscle. The fascia was then incised in line with the  incision at the junction of the anterior third and posterior 2/3rd. The  muscle was teased off the fascia and then the interval between the TFL  and the rectus was developed. The Hohmann retractor was then placed at  the top of the femoral neck over the capsule. The vessels overlying the  capsule were cauterized and the fat on top of the capsule was removed.  A Hohmann retractor was then placed anterior underneath the rectus  femoris to give exposure to the entire anterior capsule. A T-shaped   capsulotomy was performed. The edges were tagged and the femoral head  was identified.       Osteophytes are removed off the superior acetabulum.  The femoral neck was then cut in situ with an oscillating saw. Traction  was then applied to the left lower extremity utilizing the [facility]  traction. The femoral head was then removed. Retractors were placed  around the acetabulum and then circumferential removal of the labrum was  performed. Osteophytes were also removed. Reaming starts at 45 mm to  medialize and  Increased in 2 mm increments to 47 mm. We reamed in  approximately 40 degrees of abduction, 20 degrees anteversion. A 48 mm  pinnacle acetabular shell was then impacted in anatomic position under  fluoroscopic guidance with excellent purchase. We did not need to place  any additional dome screws. A 28 mm neutral + 4 marathon liner was then  placed into the acetabular shell.       The femoral lift was then placed along the lateral aspect of the femur  just distal to the vastus ridge. The leg was  externally rotated and capsule  was stripped off the inferior aspect of the femoral neck down to the  level of the lesser trochanter, this was done with electrocautery. The femur was lifted after this was performed. The  leg was then placed in an extended and adducted position essentially delivering the femur. We also removed the capsule superiorly and the piriformis from the piriformis  fossa to gain excellent exposure of the  proximal femur. Rongeur was used to remove some cancellous bone to get  into the lateral portion of the proximal femur for placement of the  initial starter reamer. The starter broaches was placed  the starter broach  and was shown to go down the center of the canal. Broaching  with the Actis system was then performed starting at size 0  coursing  Up to size 4. A size 4 had excellent torsional and rotational  and axial stability. The trial standard offset neck was then  placed  with a 28 + 1.5 trial head. The hip was then reduced. We confirmed that  the stem was in the canal both on AP and lateral x-rays. It also has excellent sizing. The hip was reduced with outstanding stability through full extension and full external rotation.. AP pelvis was taken and the leg lengths were measured and found to be equal. Hip was then dislocated again and the femoral head and neck removed. The  femoral broach was removed. Size 4 Actis stem with a standard offset  neck was then impacted into the femur following native anteversion. Has  excellent purchase in the canal. Excellent torsional and rotational and  axial stability. It is confirmed to be in the canal on AP and lateral  fluoroscopic views. The 28 + 1.5 metal head was placed and the hip  reduced with outstanding stability. Again AP pelvis was taken and it  confirmed that the leg lengths were equal. The wound was then copiously  irrigated with saline solution and the capsule reattached and repaired  with Ethibond suture. 30 ml of .25% Bupivicaine was  injected into the capsule and into the edge of the tensor fascia lata as well as subcutaneous tissue. The fascia overlying the tensor fascia lata was then closed with a running #1 V-Loc. Subcu was closed with interrupted 2-0 Vicryl and subcuticular running 4-0 Monocryl. Incision was cleaned  and dried. Steri-Strips and a bulky sterile dressing applied. The patient was awakened and transported to  recovery in stable condition.        Please note that a surgical assistant was a medical necessity for this procedure to perform it in a safe and expeditious manner. Assistant was necessary to provide appropriate retraction of vital neurovascular structures and to prevent femoral fracture and allow for anatomic placement of the prosthesis.  Gaynelle Arabian, M.D.

## 2021-08-01 NOTE — Plan of Care (Signed)
  Problem: Education: Goal: Knowledge of General Education information will improve Description Including pain rating scale, medication(s)/side effects and non-pharmacologic comfort measures Outcome: Progressing   

## 2021-08-01 NOTE — Transfer of Care (Signed)
Immediate Anesthesia Transfer of Care Note  Patient: Brianna Mccarty  Procedure(s) Performed: TOTAL HIP ARTHROPLASTY ANTERIOR APPROACH (Right: Hip)  Patient Location: PACU  Anesthesia Type:Spinal  Level of Consciousness: awake, drowsy and patient cooperative  Airway & Oxygen Therapy: Patient Spontanous Breathing and Patient connected to face mask oxygen  Post-op Assessment: Report given to RN and Post -op Vital signs reviewed and stable  Post vital signs: Reviewed and stable  Last Vitals:  Vitals Value Taken Time  BP 97/49 08/01/21 1330  Temp    Pulse 57 08/01/21 1332  Resp 18 08/01/21 1332  SpO2 99 % 08/01/21 1332  Vitals shown include unvalidated device data.  Last Pain:  Vitals:   08/01/21 1105  TempSrc:   PainSc: 0-No pain         Complications: No notable events documented.

## 2021-08-01 NOTE — Interval H&P Note (Signed)
History and Physical Interval Note:  08/01/2021 10:18 AM  Brianna Mccarty  has presented today for surgery, with the diagnosis of right hip osteoarthritis.  The various methods of treatment have been discussed with the patient and family. After consideration of risks, benefits and other options for treatment, the patient has consented to  Procedure(s): TOTAL HIP ARTHROPLASTY ANTERIOR APPROACH (Right) as a surgical intervention.  The patient's history has been reviewed, patient examined, no change in status, stable for surgery.  I have reviewed the patient's chart and labs.  Questions were answered to the patient's satisfaction.     Pilar Plate Isidore Margraf

## 2021-08-01 NOTE — Anesthesia Postprocedure Evaluation (Signed)
Anesthesia Post Note  Patient: Brianna Mccarty  Procedure(s) Performed: TOTAL HIP ARTHROPLASTY ANTERIOR APPROACH (Right: Hip)     Patient location during evaluation: PACU Anesthesia Type: MAC and Spinal Level of consciousness: awake and alert Pain management: pain level controlled Vital Signs Assessment: post-procedure vital signs reviewed and stable Respiratory status: spontaneous breathing and respiratory function stable Cardiovascular status: blood pressure returned to baseline and stable Postop Assessment: spinal receding Anesthetic complications: no   No notable events documented.  Last Vitals:  Vitals:   08/01/21 1415 08/01/21 1430  BP: 115/82 105/74  Pulse: (!) 50 (!) 59  Resp: 12 20  Temp:    SpO2: 99% 99%    Last Pain:  Vitals:   08/01/21 1430  TempSrc:   PainSc: 0-No pain                 Paiten Boies DANIEL

## 2021-08-01 NOTE — Discharge Instructions (Addendum)
Gaynelle Arabian, MD Total Joint Specialist [facility] [place] [address], [address] [place], [place] [address] [phone]  ANTERIOR APPROACH TOTAL HIP REPLACEMENT POSTOPERATIVE DIRECTIONS     Hip Rehabilitation, Guidelines Following Surgery  The results of a hip operation are greatly improved after range of motion and muscle strengthening exercises. Follow all safety measures which are given to protect your hip. If any of these exercises cause increased pain or swelling in your joint, decrease the amount until you are comfortable again. Then slowly increase the exercises. Call your caregiver if you have problems or questions.   BLOOD CLOT PREVENTION Take a 10 mg Xarelto once a day for three weeks following surgery. Then resume one 81 mg aspirin once a day. You may resume your vitamins/supplements once you have discontinued the Xarelto. Do not take any NSAIDs (Advil, Aleve, Ibuprofen, Meloxicam, etc.) until you have discontinued the Xarelto.   HOME CARE INSTRUCTIONS  Remove items at home which could result in a fall. This includes throw rugs or furniture in walking pathways.  ICE to the affected hip as frequently as 20-30 minutes an hour and then as needed for pain and swelling. Continue to use ice on the hip for pain and swelling from surgery. You may notice swelling that will progress down to the foot and ankle. This is normal after surgery. Elevate the leg when you are not up walking on it.   Continue to use the breathing machine which will help keep your temperature down.  It is common for your temperature to cycle up and down following surgery, especially at night when you are not up moving around and exerting yourself.  The breathing machine keeps your lungs expanded and your temperature down.  DIET You may resume your previous home diet once your are discharged from the hospital.  DRESSING / WOUND CARE / SHOWERING You have an adhesive waterproof bandage over the  incision. Leave this in place until your first follow-up appointment. Once you remove this you will not need to place another bandage.  You may begin showering 3 days following surgery, but do not submerge the incision under water.  ACTIVITY For the first 3-5 days, it is important to rest and keep the operative leg elevated. You should, as a general rule, rest for 50 minutes and walk/stretch for 10 minutes per hour. After 5 days, you may slowly increase activity as tolerated.  Perform the exercises you were provided twice a day for about 15-20 minutes each session. Begin these 2 days following surgery. Walk with your walker as instructed. Use the walker until you are comfortable transitioning to a cane. Walk with the cane in the opposite hand of the operative leg. You may discontinue the cane once you are comfortable and walking steadily. Avoid periods of inactivity such as sitting longer than an hour when not asleep. This helps prevent blood clots.  Do not drive a car for 6 weeks or until released by your surgeon.  Do not drive while taking narcotics.  TED HOSE STOCKINGS Wear the elastic stockings on both legs for three weeks following surgery during the day. You may remove them at night while sleeping.  WEIGHT BEARING Weight bearing as tolerated with assist device (walker, cane, etc) as directed, use it as long as suggested by your surgeon or therapist, typically at least 4-6 weeks.  POSTOPERATIVE CONSTIPATION PROTOCOL Constipation - defined medically as fewer than three stools per week and severe constipation as less than one stool per week.  less than one stool per week.  One of the most common issues patients have following surgery is constipation.  Even if you have a regular bowel pattern at home, your normal regimen is likely to be disrupted due to multiple reasons following surgery.  Combination of anesthesia, postoperative narcotics, change in appetite and fluid intake all can  affect your bowels.  In order to avoid complications following surgery, here are some recommendations in order to help you during your recovery period.  Colace (docusate) - Pick up an over-the-counter form of Colace or another stool softener and take twice a day as long as you are requiring postoperative pain medications.  Take with a full glass of water daily.  If you experience loose stools or diarrhea, hold the colace until you stool forms back up.  If your symptoms do not get better within 1 week or if they get worse, check with your doctor. Dulcolax (bisacodyl) - Pick up over-the-counter and take as directed by the product packaging as needed to assist with the movement of your bowels.  Take with a full glass of water.  Use this product as needed if not relieved by Colace only.  MiraLax (polyethylene glycol) - Pick up over-the-counter to have on hand.  MiraLax is a solution that will increase the amount of water in your bowels to assist with bowel movements.  Take as directed and can mix with a glass of water, juice, soda, coffee, or tea.  Take if you go more than two days without a movement.Do not use MiraLax more than once per day. Call your doctor if you are still constipated or irregular after using this medication for 7 days in a row.  If you continue to have problems with postoperative constipation, please contact the office for further assistance and recommendations.  If you experience "the worst abdominal pain ever" or develop nausea or vomiting, please contact the office immediatly for further recommendations for treatment.  ITCHING  If you experience itching with your medications, try taking only a single pain pill, or even half a pain pill at a time.  You can also use Benadryl over the counter for itching or also to help with sleep.   MEDICATIONS See your medication summary on the "After Visit Summary" that the nursing staff will review with you prior to discharge.  You may have some home  medications which will be placed on hold until you complete the course of blood thinner medication.  It is important for you to complete the blood thinner medication as prescribed by your surgeon.  Continue your approved medications as instructed at time of discharge.  PRECAUTIONS If you experience chest pain or shortness of breath - call 911 immediately for transfer to the hospital emergency department.  If you develop a fever greater that 101 F, purulent drainage from wound, increased redness or drainage from wound, foul odor from the wound/dressing, or calf pain - CONTACT YOUR SURGEON.                                                   FOLLOW-UP APPOINTMENTS Make sure you keep all of your appointments after your operation with your surgeon and caregivers. You should call the office at the above phone number and make an appointment for approximately two weeks after the date of your surgery or on the   date instructed by your surgeon outlined in the "After Visit Summary".  RANGE OF MOTION AND STRENGTHENING EXERCISES  These exercises are designed to help you keep full movement of your hip joint. Follow your caregiver's or physical therapist's instructions. Perform all exercises about fifteen times, three times per day or as directed. Exercise both hips, even if you have had only one joint replacement. These exercises can be done on a training (exercise) mat, on the floor, on a table or on a bed. Use whatever works the best and is most comfortable for you. Use music or television while you are exercising so that the exercises are a pleasant break in your day. This will make your life better with the exercises acting as a break in routine you can look forward to.  Lying on your back, slowly slide your foot toward your buttocks, raising your knee up off the floor. Then slowly slide your foot back down until your leg is straight again.  Lying on your back spread your legs as far apart as you can without causing  discomfort.  Lying on your side, raise your upper leg and foot straight up from the floor as far as is comfortable. Slowly lower the leg and repeat.  Lying on your back, tighten up the muscle in the front of your thigh (quadriceps muscles). You can do this by keeping your leg straight and trying to raise your heel off the floor. This helps strengthen the largest muscle supporting your knee.  Lying on your back, tighten up the muscles of your buttocks both with the legs straight and with the knee bent at a comfortable angle while keeping your heel on the floor.   POST-OPERATIVE OPIOID TAPER INSTRUCTIONS: It is important to wean off of your opioid medication as soon as possible. If you do not need pain medication after your surgery it is ok to stop day one. Opioids include: Codeine, Hydrocodone(Norco, Vicodin), Oxycodone(Percocet, oxycontin) and hydromorphone amongst others.  Long term and even short term use of opiods can cause: Increased pain response Dependence Constipation Depression Respiratory depression And more.  Withdrawal symptoms can include Flu like symptoms Nausea, vomiting And more Techniques to manage these symptoms Hydrate well Eat regular healthy meals Stay active Use relaxation techniques(deep breathing, meditating, yoga) Do Not substitute Alcohol to help with tapering If you have been on opioids for less than two weeks and do not have pain than it is ok to stop all together.  Plan to wean off of opioids This plan should start within one week post op of your joint replacement. Maintain the same interval or time between taking each dose and first decrease the dose.  Cut the total daily intake of opioids by one tablet each day Next start to increase the time between doses. The last dose that should be eliminated is the evening dose.   IF YOU ARE TRANSFERRED TO A SKILLED REHAB FACILITY If the patient is transferred to a skilled rehab facility following release from the  hospital, a list of the current medications will be sent to the facility for the patient to continue.  When discharged from the skilled rehab facility, please have the facility set up the patient's Home Health Physical Therapy prior to being released. Also, the skilled facility will be responsible for providing the patient with their medications at time of release from the facility to include their pain medication, the muscle relaxants, and their blood thinner medication. If the patient is still at the rehab facility   up appointment, the skilled rehab facility will also need to assist the patient in arranging follow up appointment in our office and any transportation needs.  MAKE SURE YOU:  Understand these instructions.  Get help right away if you are not doing well or get worse.    DENTAL ANTIBIOTICS:  In most cases prophylactic antibiotics for Dental procdeures after total joint surgery are not necessary.  Exceptions are as follows:  1. History of prior total joint infection  2. Severely immunocompromised (Organ Transplant, cancer chemotherapy, Rheumatoid biologic meds such as [place])  3. Poorly controlled diabetes (A1C &gt; 8.0, blood glucose over 200)  If you have one of these conditions, contact your surgeon for an antibiotic prescription, prior to your dental procedure.    Pick up stool softner and laxative for home use following surgery while on pain medications. Do not submerge incision under water. Please use good hand washing techniques while changing dressing each day. May shower starting three days after surgery. Please use a clean towel to pat the incision dry following showers. Continue to use ice for pain and swelling after surgery. Do not use any lotions or creams on the incision until instructed by your surgeon.  Information on my medicine - XARELTO (Rivaroxaban)   Why was Xarelto prescribed for you? Xarelto was prescribed for you to reduce the risk of blood  clots forming after orthopedic surgery. The medical term for these abnormal blood clots is venous thromboembolism (VTE).  What do you need to know about xarelto ? Take your Xarelto ONCE DAILY at the same time every day. You may take it either with or without food.  If you have difficulty swallowing the tablet whole, you may crush it and mix in applesauce just prior to taking your dose.  Take Xarelto exactly as prescribed by your doctor and DO NOT stop taking Xarelto without talking to the doctor who prescribed the medication.  Stopping without other VTE prevention medication to take the place of Xarelto may increase your risk of developing a clot.  After discharge, you should have regular check-up appointments with your healthcare provider that is prescribing your Xarelto.    What do you do if you miss a dose? If you miss a dose, take it as soon as you remember on the same day then continue your regularly scheduled once daily regimen the next day. Do not take two doses of Xarelto on the same day.   Important Safety Information A possible side effect of Xarelto is bleeding. You should call your healthcare provider right away if you experience any of the following: Bleeding from an injury or your nose that does not stop. Unusual colored urine (red or dark brown) or unusual colored stools (red or black). Unusual bruising for unknown reasons. A serious fall or if you hit your head (even if there is no bleeding).  Some medicines may interact with Xarelto and might increase your risk of bleeding while on Xarelto. To help avoid this, consult your healthcare provider or pharmacist prior to using any new prescription or non-prescription medications, including herbals, vitamins, non-steroidal anti-inflammatory drugs (NSAIDs) and supplements.  This website has more information on Xarelto: [email or web address]

## 2021-08-02 DIAGNOSIS — M1611 Unilateral primary osteoarthritis, right hip: Secondary | ICD-10-CM | POA: Diagnosis not present

## 2021-08-02 LAB — BASIC METABOLIC PANEL
Anion gap: 6 (ref 5–15)
BUN: 13 mg/dL (ref 8–23)
CO2: 25 mmol/L (ref 22–32)
Calcium: 8.5 mg/dL — ABNORMAL LOW (ref 8.9–10.3)
Chloride: 106 mmol/L (ref 98–111)
Creatinine, Ser: 0.69 mg/dL (ref 0.44–1.00)
GFR, Estimated: >60 mL/min (ref >60)
Glucose, Bld: 126 mg/dL — ABNORMAL HIGH (ref 70–99)
Potassium: 3.4 mmol/L — ABNORMAL LOW (ref 3.5–5.1)
Sodium: 137 mmol/L (ref 135–145)

## 2021-08-02 LAB — CBC
HCT: 32.9 % — ABNORMAL LOW (ref 36.0–46.0)
Hemoglobin: 10.9 g/dL — ABNORMAL LOW (ref 12.0–15.0)
MCH: 30 pg (ref 26.0–34.0)
MCHC: 33.1 g/dL (ref 30.0–36.0)
MCV: 90.6 fL (ref 80.0–100.0)
Platelets: 206 10*3/uL (ref 150–400)
RBC: 3.63 MIL/uL — ABNORMAL LOW (ref 3.87–5.11)
RDW: 13.6 % (ref 11.5–15.5)
WBC: 10.5 10*3/uL (ref 4.0–10.5)
nRBC: 0 % (ref 0.0–0.2)

## 2021-08-02 MED ORDER — HYDROCODONE-ACETAMINOPHEN 5-325 MG PO TABS: 1.0000 | ORAL_TABLET | Freq: Four times a day (QID) | ORAL | 0 refills | Status: DC | PRN

## 2021-08-02 MED ORDER — POTASSIUM CHLORIDE CRYS ER 20 MEQ PO TBCR
40.0000 meq | EXTENDED_RELEASE_TABLET | Freq: Once | ORAL | Status: AC
  Administered 2021-08-02: 40 meq via ORAL
  Filled 2021-08-02: qty 2

## 2021-08-02 MED ORDER — TRAMADOL HCL 50 MG PO TABS: 50.0000 mg | ORAL_TABLET | Freq: Four times a day (QID) | ORAL | 0 refills | Status: DC | PRN

## 2021-08-02 MED ORDER — RIVAROXABAN 10 MG PO TABS: 10.0000 mg | ORAL_TABLET | Freq: Every day | ORAL | 0 refills | Status: DC

## 2021-08-02 MED ORDER — METHOCARBAMOL 500 MG PO TABS: 500.0000 mg | ORAL_TABLET | Freq: Four times a day (QID) | ORAL | 0 refills | Status: DC | PRN

## 2021-08-02 NOTE — Progress Notes (Signed)
Physical Therapy Treatment Patient Details Name: Brianna Mccarty MRN: [id number] DOB: [birth year] Today's Date: 08/02/2021   History of Present Illness 80 YO S/P R THA through direct anterior approach. S/P LTHA    PT Comments    The patient  reports no pain. Wants to Dc home today. PT goals met. RN aware. SOPO2 96% after ambulating.   Recommendations for follow up therapy are one component of a multi-disciplinary discharge planning process, led by the attending physician.  Recommendations may be updated based on patient status, additional functional criteria and insurance authorization.  Follow Up Recommendations  Follow surgeon's recommendation for DC plan and follow-up therapies     Equipment Recommendations  None recommended by PT    Recommendations for Other Services       Precautions / Restrictions Precautions Precautions: Anterior Hip;Fall Restrictions RLE Weight Bearing: Weight bearing as tolerated     Mobility  Bed Mobility               General bed mobility comments: in recliner    Transfers Overall transfer level: Needs assistance Equipment used: Rolling walker (2 wheeled) Transfers: Sit to/from Stand Sit to Stand: Supervision         General transfer comment: cues to have Rw available, not stand without Rw  Ambulation/Gait Ambulation/Gait assistance: Min guard Gait Distance (Feet): 50 Feet Assistive device: Rolling walker (2 wheeled) Gait Pattern/deviations: Step-to pattern;Step-through pattern Gait velocity: decr   General Gait Details: cues for sequence   Stairs Stairs: Yes Stairs assistance: Min assist Stair Management: Forwards;With walker Number of Stairs: 1 General stair comments: cues for  sequence   Wheelchair Mobility    Modified Rankin (Stroke Patients Only)       Balance Overall balance assessment: Mild deficits observed, not formally tested                                          Cognition  Arousal/Alertness: Awake/alert Behavior During Therapy: WFL for tasks assessed/performed Overall Cognitive Status: Within Functional Limits for tasks assessed                                        Exercises   General Comments        Pertinent Vitals/Pain Pain Assessment: No/denies pain Pain Score: 5  Pain Location: right thigh/HA Pain Descriptors / Indicators: Aching Pain Intervention(s): Limited activity within patient's tolerance;Monitored during session;Premedicated before session;Ice applied    Home Living Family/patient expects to be discharged to:: Private residence Living Arrangements: Spouse/significant other Available Help at Discharge: Family Type of Home: House Home Access: Stairs to enter   Home Layout: One level Home Equipment: Environmental consultant - 2 wheels;Bedside commode      Prior Function Level of Independence: Independent          PT Goals (current goals can now be found in the care plan section) Acute Rehab PT Goals Patient Stated Goal: o go home PT Goal Formulation: With patient Time For Goal Achievement: 08/09/21 Potential to Achieve Goals: Good Progress towards PT goals: Progressing toward goals    Frequency    7X/week      PT Plan Current plan remains appropriate    Co-evaluation              AM-PAC PT "6 Clicks" Mobility  Outcome Measure  Help needed turning from your back to your side while in a flat bed without using bedrails?: A Little Help needed moving from lying on your back to sitting on the side of a flat bed without using bedrails?: A Little Help needed moving to and from a bed to a chair (including a wheelchair)?: A Little Help needed standing up from a chair using your arms (e.g., wheelchair or bedside chair)?: A Little Help needed to walk in hospital room?: A Little Help needed climbing 3-5 steps with a railing? : A Little 6 Click Score: 18    End of Session Equipment Utilized During Treatment: Gait  belt Activity Tolerance: Patient tolerated treatment well Patient left: in chair;with call bell/phone within reach Nurse Communication: Mobility status PT Visit Diagnosis: Muscle weakness (generalized) (M62.81) Pain - Right/Left: Right Pain - part of body: Hip     Time: 1400-1425 PT Time Calculation (min) (ACUTE ONLY): 25 min  Charges:  $Gait Training: 8-22 mins $Self Care/Home Management: [place] Pager [phone] Office [phone]    Claretha Cooper 08/02/2021, 2:24 PM

## 2021-08-02 NOTE — TOC Transition Note (Signed)
Transition of Care [facility]) - CM/SW Discharge Note  Patient Details  Name: Brianna Mccarty MRN: [id number] Date of Birth: [birth year]  Transition of Care [facility]) CM/SW Contact:  Sherie Don, LCSW Phone Number: 08/02/2021, 9:36 AM  Clinical Narrative: Patient is expected to discharge home after working with PT. CSW met with patient to confirm discharge plan. Patient will discharge home with a home exercise program (HEP). Patient has a rolling walker, 3N1, shower chair, and cane at home so there are no DME needs at this time. TOC signing off.  Final next level of care: Home/Self Care Barriers to Discharge: No Barriers Identified  Patient Goals and CMS Choice Patient states their goals for this hospitalization and ongoing recovery are:: Return home CMS Medicare.gov Compare Post Acute Care list provided to:: Patient Choice offered to / list presented to : NA  Discharge Plan and Services         DME Arranged: N/A DME Agency: NA HH Arranged: NA [place] Agency: NA  Readmission Risk Interventions No flowsheet data found.

## 2021-08-02 NOTE — Care Plan (Signed)
Ortho Bundle Case Management Note  Patient Details  Name: Brianna Mccarty MRN: [id number] Date of Birth: 1941-02-18  R THA on 08-01-21 DCP:  Home with husband.  1 story home with 1 ste. DME:  No needs.  Has a RW and 3-in-1 PT:  HEP                   DME Arranged:  N/A DME Agency:  NA  HH Arranged:  NA HH Agency:  NA  Additional Comments: Please contact me with any questions of if this plan should need to change.  Marianne Sofia, RN,CCM [facility]  [phone] 08/02/2021, 8:25 AM

## 2021-08-02 NOTE — Anesthesia Procedure Notes (Signed)
Spinal  Patient location during procedure: OR Start time: 08/01/2021 11:46 AM End time: 08/01/2021 11:54 AM Reason for block: surgical anesthesia Staffing Performed: anesthesiologist  Anesthesiologist: Duane Boston, MD Preanesthetic Checklist Completed: patient identified, IV checked, risks and benefits discussed, surgical consent, monitors and equipment checked, pre-op evaluation and timeout performed Spinal Block Patient position: sitting Prep: DuraPrep Patient monitoring: cardiac monitor, continuous pulse ox and blood pressure Approach: midline Location: L2-3 Injection technique: single-shot Needle Needle type: Pencan  Needle gauge: 24 G Needle length: 9 cm Assessment Events: CSF return Additional Notes Functioning IV was confirmed and monitors were applied. Sterile prep and drape, including hand hygiene and sterile gloves were used. The patient was positioned and the spine was prepped. The skin was anesthetized with lidocaine.  Free flow of clear CSF was obtained prior to injecting local anesthetic into the CSF.  The spinal needle aspirated freely following injection.  The needle was carefully withdrawn.  The patient tolerated the procedure well.

## 2021-08-02 NOTE — Progress Notes (Signed)
Subjective: 1 Day Post-Op Procedure(s) (LRB): TOTAL HIP ARTHROPLASTY ANTERIOR APPROACH (Right) Patient reports pain as mild.   Patient seen in rounds by Dr. Wynelle Link. Patient is well, and has had no acute complaints or problems. Denies SOB, chest pain, or calf pain. No acute overnight events. Will begin therapy today. Foley pulled this am.    Objective: Vital signs in last 24 hours: Temp:  [97.6 F (36.4 C)-98.4 F (36.9 C)] 98.4 F (36.9 C) (09/29 0437) Pulse Rate:  [50-112] 70 (09/29 0437) Resp:  [12-23] 16 (09/29 0437) BP: (97-164)/(49-90) 146/59 (09/29 0437) SpO2:  [98 %-100 %] 98 % (09/29 0437) Weight:  [79.4 kg] 79.4 kg (09/28 1105)  Intake/Output from previous day:  Intake/Output Summary (Last 24 hours) at 08/02/2021 0743 Last data filed at 08/02/2021 0645 Gross per 24 hour  Intake 2305.68 ml  Output 3650 ml  Net -1344.32 ml     Intake/Output this shift: No intake/output data recorded.  Labs: Recent Labs    08/02/21 0317  HGB 10.9*   Recent Labs    08/02/21 0317  WBC 10.5  RBC 3.63*  HCT 32.9*  PLT 206   Recent Labs    08/02/21 0317  NA 137  K 3.4*  CL 106  CO2 25  BUN 13  CREATININE 0.69  GLUCOSE 126*  CALCIUM 8.5*   No results for input(s): LABPT, INR in the last 72 hours.  Exam: General - Patient is Alert and Oriented Extremity - Neurologically intact Neurovascular intact Intact pulses distally Dorsiflexion/Plantar flexion intact Dressing - dressing C/D/I Motor Function - intact, moving foot and toes well on exam.   Past Medical History:  Diagnosis Date   Abnormal EKG    Normal LV function in the past   Allergic rhinitis    Asthma    Bronchitis, mucopurulent recurrent (HCC)    Carotid artery disease (HCC)    a. mild by carotid duplex.   Cervical dysplasia 1971   Coronary artery disease    Diverticulosis of colon    DJD (degenerative joint disease)    Family history of cancer of extrahepatic bile ducts 09/05/2020   Family  history of colon cancer 09/05/2020   Family history of pancreatic cancer 09/05/2020   GERD (gastroesophageal reflux disease)    Headache(784.0)    History of kidney stones    Hypercholesterolemia    Hypertension    Interstitial cystitis    sees urologist   Lichen sclerosus    Malignant melanoma ([place]) 2006   sees Dr. [place] Crane in dermatology   Migraines    Mild aortic stenosis    Mitral valve disease    Question mitral valve prolapse in the past, no prolapse by echo 2009   Murmur 10/20/2015   SEES DR NELSON   OSA (obstructive sleep apnea)    USES DENTAL DEVICE   Osteoporosis    on fosomax > 5 years, stopped 11/2015   Pneumonia    Thyroid cyst    1 x 1.1 thyroid cyst noted on carotid Doppler, January, 2012   Venous insufficiency     Assessment/Plan: 1 Day Post-Op Procedure(s) (LRB): TOTAL HIP ARTHROPLASTY ANTERIOR APPROACH (Right) Principal Problem:   OA (osteoarthritis) of hip Active Problems:   S/P total right hip arthroplasty  Estimated body mass index is 33.07 kg/m as calculated from the following:   Height as of this encounter: 5\' 1"  (1.549 m).   Weight as of this encounter: 79.4 kg. Up with therapy  DVT Prophylaxis - Xarelto and  TED hose Weight bearing as tolerated. Continue therapy.  Plan is to go Home after hospital stay.   Plan for two sessions with PT this morning, and if meeting goals, will plan for discharge this afternoon.   Patient to follow up in two weeks with Dr. Wynelle Link in clinic.   The PDMP database was reviewed today prior to any opioid medications being prescribed to this patient.[name], [place], PA-C Orthopedic Surgery [phone] 08/02/2021, 7:43 AM

## 2021-08-02 NOTE — Addendum Note (Signed)
Addendum  created 08/02/21 1647 by Duane Boston, MD   Child order released for a procedure order, Clinical Note Signed, Intraprocedure Blocks edited, SmartForm saved

## 2021-08-05 ENCOUNTER — Encounter (HOSPITAL_COMMUNITY): Payer: Self-pay | Admitting: Orthopedic Surgery

## 2021-08-14 NOTE — Discharge Summary (Signed)
Physician Discharge Summary   Patient ID: Brianna Mccarty MRN: [id number] DOB/AGE: [birth year] 80 y.o.  Admit date: 08/01/2021 Discharge date: 08/02/2021  Primary Diagnosis: s/p Right THA  Admission Diagnoses:  Past Medical History:  Diagnosis Date   Abnormal EKG    Normal LV function in the past   Allergic rhinitis    Asthma    Bronchitis, mucopurulent recurrent (HCC)    Carotid artery disease (HCC)    a. mild by carotid duplex.   Cervical dysplasia 1971   Coronary artery disease    Diverticulosis of colon    DJD (degenerative joint disease)    Family history of cancer of extrahepatic bile ducts 09/05/2020   Family history of colon cancer 09/05/2020   Family history of pancreatic cancer 09/05/2020   GERD (gastroesophageal reflux disease)    Headache(784.0)    History of kidney stones    Hypercholesterolemia    Hypertension    Interstitial cystitis    sees urologist   Lichen sclerosus    Malignant melanoma ([place]) 2006   sees Dr. [place] Crane in dermatology   Migraines    Mild aortic stenosis    Mitral valve disease    Question mitral valve prolapse in the past, no prolapse by echo 2009   Murmur 10/20/2015   SEES DR NELSON   OSA (obstructive sleep apnea)    USES DENTAL DEVICE   Osteoporosis    on fosomax > 5 years, stopped 11/2015   Pneumonia    Thyroid cyst    1 x 1.1 thyroid cyst noted on carotid Doppler, January, 2012   Venous insufficiency    Discharge Diagnoses:   Principal Problem:   OA (osteoarthritis) of hip Active Problems:   S/P total right hip arthroplasty  Estimated body mass index is 33.07 kg/m as calculated from the following:   Height as of this encounter: 5\' 1"  (1.549 m).   Weight as of this encounter: 79.4 kg.  Procedure:  Procedure(s) (LRB): TOTAL HIP ARTHROPLASTY ANTERIOR APPROACH (Right)   Consults: None  HPI: Brianna Mccarty is a 80 y.o. female who has advanced end-  stage arthritis of their Right  hip with progressively worsening pain and   dysfunction.The patient has failed nonoperative management and presents for  total hip arthroplasty.   Laboratory Data: Admission on 08/01/2021, Discharged on 08/02/2021  Component Date Value Ref Range Status   ABO/RH(D) 08/01/2021    Final                   Value:B POS Performed at [facility], [place] [address], [place], [place] [address]    WBC 08/02/2021 10.5  4.0 - 10.5 K/uL Final   RBC 08/02/2021 3.63 (A) 3.87 - 5.11 MIL/uL Final   Hemoglobin 08/02/2021 10.9 (A) 12.0 - 15.0 g/dL Final   HCT 08/02/2021 32.9 (A) 36.0 - 46.0 % Final   MCV 08/02/2021 90.6  80.0 - 100.0 fL Final   MCH 08/02/2021 30.0  26.0 - 34.0 pg Final   MCHC 08/02/2021 33.1  30.0 - 36.0 g/dL Final   RDW 08/02/2021 13.6  11.5 - 15.5 % Final   Platelets 08/02/2021 206  150 - 400 K/uL Final   nRBC 08/02/2021 0.0  0.0 - 0.2 % Final   Performed at [facility], [place] [address] [place], [place] [address]   Sodium 08/02/2021 137  135 - 145 mmol/L Final   Potassium 08/02/2021 3.4 (A) 3.5 - 5.1 mmol/L Final   Chloride 08/02/2021  106  98 - 111 mmol/L Final   CO2 08/02/2021 25  22 - 32 mmol/L Final   Glucose, Bld 08/02/2021 126 (A) 70 - 99 mg/dL Final   Glucose reference range applies only to samples taken after fasting for at least 8 hours.   BUN 08/02/2021 13  8 - 23 mg/dL Final   Creatinine, Ser 08/02/2021 0.69  0.44 - 1.00 mg/dL Final   Calcium 08/02/2021 8.5 (A) 8.9 - 10.3 mg/dL Final   GFR, Estimated 08/02/2021 >60  >60 mL/min Final   Comment: (NOTE) Calculated using the CKD-EPI Creatinine Equation (2021)    Anion gap 08/02/2021 6  5 - 15 Final   Performed at [facility], [place] [address], [place], [place] [address]  Orders Only on 07/30/2021  Component Date Value Ref Range Status   SARS Coronavirus 2 07/30/2021 RESULT: NEGATIVE   Final   Comment: RESULT: NEGATIVESARS-CoV-2 INTERPRETATION:A NEGATIVE  test result means that SARS-CoV-2 RNA was not present in  the specimen above the limit of detection of this test. This does not preclude a possible SARS-CoV-2 infection and should not be used as the  sole basis for patient management decisions. Negative results must be combined with clinical observations, patient history, and epidemiological information. Optimum specimen types and timing for peak viral levels during infections caused by SARS-CoV-2  have not been determined. Collection of multiple specimens or types of specimens may be necessary to detect virus. Improper specimen collection and handling, sequence variability under primers/probes, or organism present below the limit of detection may  lead to false negative results. Positive and negative predictive values of testing are highly dependent on prevalence. False negative test results are more likely when prevalence of disease is high.The expected result is NEGATIVE.Fact S                          heet for  Healthcare Providers: LocalChronicle.no Sheet for Patients: SalonLookup.es Reference Range - Negative   Hospital Outpatient Visit on 07/18/2021  Component Date Value Ref Range Status   WBC 07/18/2021 5.9  4.0 - 10.5 K/uL Final   RBC 07/18/2021 3.96  3.87 - 5.11 MIL/uL Final   Hemoglobin 07/18/2021 11.8 (A) 12.0 - 15.0 g/dL Final   HCT 07/18/2021 36.0  36.0 - 46.0 % Final   MCV 07/18/2021 90.9  80.0 - 100.0 fL Final   MCH 07/18/2021 29.8  26.0 - 34.0 pg Final   MCHC 07/18/2021 32.8  30.0 - 36.0 g/dL Final   RDW 07/18/2021 13.5  11.5 - 15.5 % Final   Platelets 07/18/2021 224  150 - 400 K/uL Final   nRBC 07/18/2021 0.0  0.0 - 0.2 % Final   Performed at [facility], [place] [address], [place], [place] [address]   Sodium 07/18/2021 136  135 - 145 mmol/L Final   Potassium 07/18/2021 3.6  3.5 - 5.1 mmol/L Final   Chloride 07/18/2021 101  98 - 111 mmol/L Final   CO2 07/18/2021 25  22 - 32 mmol/L Final   Glucose, Bld  07/18/2021 101 (A) 70 - 99 mg/dL Final   Glucose reference range applies only to samples taken after fasting for at least 8 hours.   BUN 07/18/2021 18  8 - 23 mg/dL Final   Creatinine, Ser 07/18/2021 0.73  0.44 - 1.00 mg/dL Final   Calcium 07/18/2021 9.4  8.9 - 10.3 mg/dL Final   Total Protein 07/18/2021 6.7  6.5 - 8.1 g/dL  Final   Albumin 07/18/2021 4.2  3.5 - 5.0 g/dL Final   AST 07/18/2021 19  15 - 41 U/L Final   ALT 07/18/2021 15  0 - 44 U/L Final   Alkaline Phosphatase 07/18/2021 32 (A) 38 - 126 U/L Final   Total Bilirubin 07/18/2021 0.7  0.3 - 1.2 mg/dL Final   GFR, Estimated 07/18/2021 >60  >60 mL/min Final   Comment: (NOTE) Calculated using the CKD-EPI Creatinine Equation (2021)    Anion gap 07/18/2021 10  5 - 15 Final   Performed at [facility], [place] [address] [place], [place] [address]   ABO/RH(D) 07/18/2021 B POS   Final   Antibody Screen 07/18/2021 NEG   Final   Sample Expiration 07/18/2021 08/01/2021,2359   Final   Extend sample reason 07/18/2021    Final                   Value:NO TRANSFUSIONS OR PREGNANCY IN THE PAST 3 MONTHS Performed at [place] [address], [place], [place] [address]    Prothrombin Time 07/18/2021 13.7  11.4 - 15.2 seconds Final   INR 07/18/2021 1.1  0.8 - 1.2 Final   Comment: (NOTE) INR goal varies based on device and disease states. Performed at [facility], [place] [address] [place], [place] [address]    MRSA, PCR 07/18/2021 NEGATIVE  NEGATIVE Final   Staphylococcus aureus 07/18/2021 NEGATIVE  NEGATIVE Final   Comment: (NOTE) The Xpert SA Assay (FDA approved for NASAL specimens in patients 42 years of age and older), is one component of a comprehensive surveillance program. It is not intended to diagnose infection nor to guide or monitor treatment. Performed at [facility], [place] [address] [place], [place] [address]      X-Rays:DG Pelvis  Portable  Result Date: 08/01/2021 CLINICAL DATA:  Post right hip replacement EXAM: PORTABLE PELVIS 1-2 VIEWS COMPARISON:  None. FINDINGS: New changes of right hip replacement and remote changes of left hip replacement. Normal AP alignment. No visible hardware bony complicating feature. IMPRESSION: Right hip replacement.  No visible complicating feature. Electronically Signed   By: Rolm Baptise M.D.   On: 08/01/2021 14:00   DG C-Arm 1-60 Min-No Report  Result Date: 08/01/2021 Fluoroscopy was utilized by the requesting physician.  No radiographic interpretation.   DG HIP OPERATIVE UNILAT WITH PELVIS RIGHT  Result Date: 08/01/2021 CLINICAL DATA:  Right anterior hip replacement EXAM: OPERATIVE RIGHT HIP (WITH PELVIS IF PERFORMED) 2 VIEWS TECHNIQUE: Fluoroscopic spot image(s) were submitted for interpretation post-operatively. COMPARISON:  None. FINDINGS: Changes of right hip replacement. No hardware or bony complicating feature. Normal AP alignment. Remote changes of left hip replacement. IMPRESSION: Right hip replacement.  No visible complicating feature. Electronically Signed   By: Rolm Baptise M.D.   On: 08/01/2021 13:59    EKG: Orders placed or performed in visit on 03/14/21   EKG 12-Lead     Hospital Course: Brianna Mccarty is a 80 y.o. who was admitted to [facility]. They were brought to the operating room on 08/01/2021 and underwent Procedure(s): [place].  Patient tolerated the procedure well and was later transferred to the recovery room and then to the orthopaedic floor for postoperative care. They were given PO and IV analgesics for pain control following their surgery. They were given 24 hours of postoperative antibiotics of  Anti-infectives (From admission, onward)    Start     Dose/Rate  Route Frequency Ordered Stop   08/01/21 2359  vancomycin (VANCOCIN) IVPB 1000 mg/200 mL premix        1,000 mg 200 mL/hr over 60 Minutes Intravenous Every 12  hours 08/01/21 1704 08/02/21 0141   08/01/21 1015  vancomycin (VANCOCIN) IVPB 1000 mg/200 mL premix        1,000 mg 200 mL/hr over 60 Minutes Intravenous On call to O.R. 08/01/21 1010 08/01/21 1244      and started on DVT prophylaxis in the form of Xarelto and TED hose.   PT and OT were ordered for total joint protocol. Discharge planning consulted to help with postop disposition and equipment needs.  Patient had an uneventful night on the evening of surgery. They started to get up OOB with therapy on POD#1. Pt was seen during rounds and was ready to go home pending progress with therapy. She worked with therapy on POD #1 and was meeting goals. Pt was discharged to home later that day in stable condition.  Diet: Regular diet Activity: WBAT Follow-up: in 2 weeks Disposition: Home Discharged Condition: good   Discharge Instructions     Call MD / Call 911   Complete by: As directed    If you experience chest pain or shortness of breath, CALL 911 and be transported to the hospital emergency room.  If you develope a fever above 101 F, pus (white drainage) or increased drainage or redness at the wound, or calf pain, call your surgeon's office.   Change dressing   Complete by: As directed    You have an adhesive waterproof bandage over the incision. Leave this in place until your first follow-up appointment. Once you remove this you will not need to place another bandage.   Constipation Prevention   Complete by: As directed    Drink plenty of fluids.  Prune juice may be helpful.  You may use a stool softener, such as Colace (over the counter) 100 mg twice a day.  Use MiraLax (over the counter) for constipation as needed.   Diet - low sodium heart healthy   Complete by: As directed    Do not sit on low chairs, stoools or toilet seats, as it may be difficult to get up from low surfaces   Complete by: As directed    Driving restrictions   Complete by: As directed    No driving for two weeks    Post-operative opioid taper instructions:   Complete by: As directed    POST-OPERATIVE OPIOID TAPER INSTRUCTIONS: It is important to wean off of your opioid medication as soon as possible. If you do not need pain medication after your surgery it is ok to stop day one. Opioids include: Codeine, Hydrocodone(Norco, Vicodin), Oxycodone(Percocet, oxycontin) and hydromorphone amongst others.  Long term and even short term use of opiods can cause: Increased pain response Dependence Constipation Depression Respiratory depression And more.  Withdrawal symptoms can include Flu like symptoms Nausea, vomiting And more Techniques to manage these symptoms Hydrate well Eat regular healthy meals Stay active Use relaxation techniques(deep breathing, meditating, yoga) Do Not substitute Alcohol to help with tapering If you have been on opioids for less than two weeks and do not have pain than it is ok to stop all together.  Plan to wean off of opioids This plan should start within one week post op of your joint replacement. Maintain the same interval or time between taking each dose and first decrease the dose.  Cut the total daily intake  of opioids by one tablet each day Next start to increase the time between doses. The last dose that should be eliminated is the evening dose.      TED hose   Complete by: As directed    Use stockings (TED hose) for three weeks on both leg(s).  You may remove them at night for sleeping.   Weight bearing as tolerated   Complete by: As directed       Allergies as of 08/02/2021       Reactions   Celebrex [celecoxib] Diarrhea   Cephalexin    Reaction was a high fever   Hydrochlorothiazide    hyponatremia   Penicillins Hives, Swelling   Swelling of arms & face Has patient had a PCN reaction causing immediate rash, facial/tongue/throat swelling, SOB or lightheadedness with hypotension: Yes Has patient had a PCN reaction causing severe rash involving mucus  membranes or skin necrosis: No Has patient had a PCN reaction that required hospitalization: No Has patient had a PCN reaction occurring within the last 10 years: No If all of the above answers are "NO", then may proceed with Cephalosporin use.   Phenobarbital Hives   Statins    Muscle pain   Passion Fruit Flavor Diarrhea, Rash   Tape Rash   MEDICAL TAPE        Medication List     STOP taking these medications    aspirin 81 MG tablet   calcium carbonate 1500 (600 Ca) MG Tabs tablet Commonly known as: OSCAL   Cinnamon 500 MG Tabs   COQ-10 PO   Excedrin Extra Strength 250-250-65 MG tablet Generic drug: aspirin-acetaminophen-caffeine   ibuprofen 200 MG tablet Commonly known as: ADVIL   ICAPS AREDS 2 PO   Magnesium 250 MG Tabs   OVER THE COUNTER MEDICATION   OVER THE COUNTER MEDICATION   OVER THE COUNTER MEDICATION   OVER THE COUNTER MEDICATION   OVER THE COUNTER MEDICATION   vitamin C 500 MG tablet Commonly known as: ASCORBIC ACID   Vitamin D3 125 MCG (5000 UT) Caps       TAKE these medications    acyclovir 400 MG tablet Commonly known as: ZOVIRAX Take 400 mg by mouth 5 (five) times daily as needed (fever blisters).   albuterol (2.5 MG/3ML) 0.083% nebulizer solution Commonly known as: PROVENTIL Take 3 mLs (2.5 mg total) by nebulization every 6 (six) hours as needed.   albuterol 108 (90 Base) MCG/ACT inhaler Commonly known as: ProAir HFA Inhale 2 puffs into the lungs every 6 (six) hours as needed for wheezing.   alendronate 70 MG tablet Commonly known as: FOSAMAX TAKE 1 TABLET EVERY 7 (SEVEN) DAYS. TAKE WITH A FULL GLASS OF WATER ON AN EMPTY STOMACH.   amLODipine 10 MG tablet Commonly known as: NORVASC TAKE 1 TABLET BY MOUTH EVERY DAY   azelastine 0.1 % nasal spray Commonly known as: ASTELIN Instill 2 sprays into each nostril two times a day as needed for allergies   betamethasone dipropionate 0.05 % cream Apply 1 application topically 2  (two) times daily as needed (scaly skin).   budesonide-formoterol 160-4.5 MCG/ACT inhaler Commonly known as: SYMBICORT Inhale 2 puffs into the lungs 2 (two) times daily.   desmopressin 0.2 MG tablet Commonly known as: DDAVP Take 0.2 mg by mouth at bedtime.   EPINEPHrine 0.3 mg/0.3 mL Soaj injection Commonly known as: EPI-PEN Inject 0.3 mg into the muscle as needed for anaphylaxis.   fexofenadine 180 MG tablet Commonly known as: ALLEGRA  Take 180 mg by mouth daily.   furosemide 20 MG tablet Commonly known as: LASIX Take 20 mg by mouth daily as needed for edema.   hydrALAZINE 50 MG tablet Commonly known as: APRESOLINE TAKE 1 TAB 3 TIMES A DAY AND TAKE EXTRA TAB IF SBP>160   HYDROcodone-acetaminophen 5-325 MG tablet Commonly known as: NORCO/VICODIN Take 1-2 tablets by mouth every 6 (six) hours as needed for severe pain.   hydrOXYzine 10 MG tablet Commonly known as: ATARAX/VISTARIL Take 10 mg by mouth at bedtime.   losartan 100 MG tablet Commonly known as: COZAAR TAKE 1 TABLET BY MOUTH EVERY DAY What changed: when to take this   METAMUCIL FIBER PO Take 1 capsule by mouth once a week.   methocarbamol 500 MG tablet Commonly known as: ROBAXIN Take 1 tablet (500 mg total) by mouth every 6 (six) hours as needed for muscle spasms.   montelukast 10 MG tablet Commonly known as: SINGULAIR Take 10 mg by mouth at bedtime.   MURO 128 OP Place 1 drop into both eyes daily as needed (allergies).   Nexletol 180 MG Tabs Generic drug: Bempedoic Acid Take 1 tablet by mouth daily.   omeprazole 40 MG capsule Commonly known as: PRILOSEC TAKE 1 CAPSULE BY MOUTH EVERY DAY   pentosan polysulfate 100 MG capsule Commonly known as: ELMIRON Take 100 mg by mouth daily.   polyethylene glycol 17 g packet Commonly known as: MIRALAX / GLYCOLAX Take 17 g by mouth daily as needed for moderate constipation.   prazosin 5 MG capsule Commonly known as: MINIPRESS *NEED OFFICE VISIT* TAKE 1  CAPSULE BY MOUTH TWICE A DAY   PROBIOTIC PO Take 1 capsule by mouth daily.   rivaroxaban 10 MG Tabs tablet Commonly known as: XARELTO Take 1 tablet (10 mg total) by mouth daily with breakfast. Take for 21 days, then take one 81 mg aspirin once a day for three weeks. Then discontinue aspirin.   traMADol 50 MG tablet Commonly known as: ULTRAM Take 1-2 tablets (50-100 mg total) by mouth every 6 (six) hours as needed for moderate pain.               Discharge Care Instructions  (From admission, onward)           Start     Ordered   08/02/21 0000  Weight bearing as tolerated        08/02/21 0748   08/02/21 0000  Change dressing       Comments: You have an adhesive waterproof bandage over the incision. Leave this in place until your first follow-up appointment. Once you remove this you will not need to place another bandage.   08/02/21 0748            Follow-up Information     Edmisten, Ok Anis, PA. Go on 08/15/2021.   Specialty: Orthopedic Surgery Why: You are scheduled for a follow up appointment on 08-15-21 at 11:00 am. Contact information: [address] [place] [address] [phone]                 Signed: [name], [place], PA-C Orthopedic Surgery 08/14/2021, 11:51 AM

## 2021-08-15 DIAGNOSIS — J3081 Allergic rhinitis due to animal (cat) (dog) hair and dander: Secondary | ICD-10-CM | POA: Diagnosis not present

## 2021-08-15 DIAGNOSIS — J301 Allergic rhinitis due to pollen: Secondary | ICD-10-CM | POA: Diagnosis not present

## 2021-08-15 DIAGNOSIS — J3089 Other allergic rhinitis: Secondary | ICD-10-CM | POA: Diagnosis not present

## 2021-08-17 ENCOUNTER — Other Ambulatory Visit: Payer: Self-pay | Admitting: Family Medicine

## 2021-08-24 DIAGNOSIS — J3089 Other allergic rhinitis: Secondary | ICD-10-CM | POA: Diagnosis not present

## 2021-08-24 DIAGNOSIS — J301 Allergic rhinitis due to pollen: Secondary | ICD-10-CM | POA: Diagnosis not present

## 2021-09-04 DIAGNOSIS — Z96641 Presence of right artificial hip joint: Secondary | ICD-10-CM | POA: Diagnosis not present

## 2021-09-04 DIAGNOSIS — Z471 Aftercare following joint replacement surgery: Secondary | ICD-10-CM | POA: Diagnosis not present

## 2021-09-05 DIAGNOSIS — J3089 Other allergic rhinitis: Secondary | ICD-10-CM | POA: Diagnosis not present

## 2021-09-05 DIAGNOSIS — J301 Allergic rhinitis due to pollen: Secondary | ICD-10-CM | POA: Diagnosis not present

## 2021-09-14 DIAGNOSIS — J301 Allergic rhinitis due to pollen: Secondary | ICD-10-CM | POA: Diagnosis not present

## 2021-09-14 DIAGNOSIS — J3089 Other allergic rhinitis: Secondary | ICD-10-CM | POA: Diagnosis not present

## 2021-09-19 DIAGNOSIS — J3089 Other allergic rhinitis: Secondary | ICD-10-CM | POA: Diagnosis not present

## 2021-09-24 ENCOUNTER — Telehealth: Payer: Self-pay | Admitting: Pharmacist

## 2021-09-24 NOTE — Chronic Care Management (AMB) (Signed)
Chronic Care Management Pharmacy Assistant   Name: Brianna Mccarty  MRN: [id number] DOB: 1940-12-07   Reason for Encounter: Chart review for initial encounter with Clinical Pharmacist  Jeni Salles on 10/01/21 at 11 am via phone call.    Conditions to be addressed/monitored: HTN, GERD, Asthma, and Osteoporosis  Primary concerns for visit include: 03/27/21  Willette Brace, LPN - Patient presented for Medicare Annual Wellness exam via video visit. No medication changes.   Recent office visits:  06/29/21 - Caren Macadam, MD - Patient presented for Pre-op and other concerns. No medication changes.  Recent consult visits:  08/24/21 [name], R - Patient presented for Allergic rhinitis due to pollen. No other details available.  08/15/21 [name], R - Patient presented for Allergic rhinitis due to pollen. No other details available.  07/05/21 Aluisio, Pilar Plate - (Orthopedic Surg) - Patient presented for Right hip pain and osteoarthritis. O other details available.   07/02/21 Leta Baptist - (Otolaryngology) - Patient presented for bilateral impacted cerumen. No other visit details available.  06/08/21 [name] - (Orthopedic Surgery) - Patient presented for Low back pain unspecified. No other visit details available.  06/05/21 [name], R - Patient presented for Moderate persistent Asthma. No other visit details available.  05/18/21 Edmisten, Ok Anis - (Orthopedic Surgery) - Patient presented for Pain in right hip and shoulder. No other visit details available.  04/06/21 Aluisio, Pilar Plate - (Orthopedic Surgery) - Patient presented for Right hip pain. No other visit details available.  03/28/2021 [name] - (Dermatology) - Patient presented for follow up examination after completed treatment for malignant neoplasm and other concerns. No other visit details available.  Hospital visits:  Medication Reconciliation was completed by comparing discharge summary, patient's EMR and  Pharmacy list, and upon discussion with patient.  Patient presented to [facility] on 08/01/21 for Right Hip Arthroplasty.Patient was present for 30 hours.   New?Medications Started at [facility] Discharge:?? -started  HYDROcodone-acetaminophen (NORCO/VICODIN) methocarbamol (ROBAXIN) rivaroxaban (XARELTO) traMADol  Medication Changes at Hospital Discharge: -Changed  None  Medications Discontinued at Hospital Discharge: -Stopped  aspirin 81 MG tablet calcium carbonate 1500 (600 Ca) MG Tabs tablet (OSCAL) Cinnamon 500 MG Tabs COQ-10 PO Excedrin Extra Strength 250-250-65 MG tablet (aspirin-acetaminophen-caffeine) ibuprofen 200 MG tablet (ADVIL) ICAPS AREDS 2 PO Magnesium 250 MG Tabs OVER THE COUNTER MEDICATION vitamin C 500 MG tablet (ASCORBIC ACID) Vitamin D3 125 Medications that remain the same after Hospital Discharge:??  -All other medications will remain the same.    Medications: Outpatient Encounter Medications as of 09/24/2021  Medication Sig Note   acyclovir (ZOVIRAX) 400 MG tablet Take 400 mg by mouth 5 (five) times daily as needed (fever blisters).    albuterol (PROAIR HFA) 108 (90 Base) MCG/ACT inhaler Inhale 2 puffs into the lungs every 6 (six) hours as needed for wheezing.    albuterol (PROVENTIL) (2.5 MG/3ML) 0.083% nebulizer solution Take 3 mLs (2.5 mg total) by nebulization every 6 (six) hours as needed.    alendronate (FOSAMAX) 70 MG tablet TAKE 1 TABLET EVERY 7 (SEVEN) DAYS. TAKE WITH A FULL GLASS OF WATER ON AN EMPTY STOMACH. 07/13/2021: Tuesdays   amLODipine (NORVASC) 10 MG tablet TAKE 1 TABLET BY MOUTH EVERY DAY    azelastine (ASTELIN) 0.1 % nasal spray Instill 2 sprays into each nostril two times a day as needed for allergies    Bempedoic Acid (NEXLETOL) 180 MG TABS Take 1 tablet by mouth daily.    betamethasone dipropionate  0.05 % cream Apply 1 application topically 2 (two) times daily as needed (scaly skin).    budesonide-formoterol  (SYMBICORT) 160-4.5 MCG/ACT inhaler Inhale 2 puffs into the lungs 2 (two) times daily.    desmopressin (DDAVP) 0.2 MG tablet Take 0.2 mg by mouth at bedtime.    EPINEPHrine 0.3 mg/0.3 mL IJ SOAJ injection Inject 0.3 mg into the muscle as needed for anaphylaxis.    fexofenadine (ALLEGRA) 180 MG tablet Take 180 mg by mouth daily.    furosemide (LASIX) 20 MG tablet Take 20 mg by mouth daily as needed for edema.    hydrALAZINE (APRESOLINE) 50 MG tablet TAKE 1 TAB 3 TIMES A DAY AND TAKE EXTRA TAB IF SBP>160    HYDROcodone-acetaminophen (NORCO/VICODIN) 5-325 MG tablet Take 1-2 tablets by mouth every 6 (six) hours as needed for severe pain.    hydrOXYzine (ATARAX/VISTARIL) 10 MG tablet Take 10 mg by mouth at bedtime.     losartan (COZAAR) 100 MG tablet TAKE 1 TABLET BY MOUTH EVERY DAY (Patient taking differently: Take 100 mg by mouth every evening.)    METAMUCIL FIBER PO Take 1 capsule by mouth once a week.    methocarbamol (ROBAXIN) 500 MG tablet Take 1 tablet (500 mg total) by mouth every 6 (six) hours as needed for muscle spasms.    montelukast (SINGULAIR) 10 MG tablet Take 10 mg by mouth at bedtime.    omeprazole (PRILOSEC) 40 MG capsule TAKE 1 CAPSULE BY MOUTH EVERY DAY    pentosan polysulfate (ELMIRON) 100 MG capsule Take 100 mg by mouth daily.    polyethylene glycol (MIRALAX / GLYCOLAX) 17 g packet Take 17 g by mouth daily as needed for moderate constipation.    prazosin (MINIPRESS) 5 MG capsule *NEED OFFICE VISIT* TAKE 1 CAPSULE BY MOUTH TWICE A DAY    Probiotic Product (PROBIOTIC PO) Take 1 capsule by mouth daily.     rivaroxaban (XARELTO) 10 MG TABS tablet Take 1 tablet (10 mg total) by mouth daily with breakfast. Take for 21 days, then take one 81 mg aspirin once a day for three weeks. Then discontinue aspirin.    Sodium Chloride, Hypertonic, (MURO 128 OP) Place 1 drop into both eyes daily as needed (allergies).    traMADol (ULTRAM) 50 MG tablet Take 1-2 tablets (50-100 mg total) by mouth  every 6 (six) hours as needed for moderate pain.    No facility-administered encounter medications on file as of 09/24/2021.  Fill History :  ACYCLOVIR 400 MG TABLET 09/19/2020   ALBUTEROL HFA 90 MCG INHALER 06/05/2021 90   ALBUTEROL SUL 2.5 MG/3 ML SOLN 01/01/2021 9   ALENDRONATE SODIUM 70 MG TAB 05/06/2021 84   AMLODIPINE BESYLATE 10 MG TAB 07/02/2021 90   AZELASTINE 0.1% (137 MCG) SPRY 05/04/2021 90   NEXLETOL 180 MG PO TABS 08/20/2021 30   BETAMETHASONE DP 0.05% CRM 03/29/2021 14   BUDESONIDE-FORMOTEROL 160-4.5 06/18/2021 90   DESMOPRESSIN ACETATE 0.2 MG TB 06/04/2021 90   EPINEPHRINE 0.3 MG AUTO-INJECT 03/25/2021 2   FUROSEMIDE 20MG  TABLET 09/19/2020 90   hydrALAZINE (APRESOLINE) tablet 08/22/2021 90   HYDROCODONE BITARTRATE/ACETAMINOPHEN 325MG  / 5MG  TABLET 08/02/2021 6   HYDROXYZINE HCL 10 MG TABLET 06/14/2021 90   LOSARTAN POTASSIUM 100 MG TAB 08/08/2021 90   METHOCARBAMOL 500MG  TABLET 08/02/2021 10   MONTELUKAST SOD 10 MG TABLET 06/27/2021 90   OMEPRAZOLE DR 40 MG CAPSULE 06/15/2021 90   ELMIRON 100 MG CAPSULE 06/30/2021 30   prazosin (MINIPRESS) capsule 08/31/2021 90  XARELTO 10MG  TABLET 08/02/2021 21   TRAMADOL HCL 50MG  TABLET 08/02/2021 5    Have you seen any other providers since your last visit? Patient reports her hip replacement was the last visit.  Any changes in your medications or health? Patient reports no changes.  Any side effects from any medications? Patient reports no she has been taking her med's for years.  Do you have an symptoms or problems not managed by your medications? Patient reports none.  Any concerns about your health right now? Patient reports no.  Has your provider asked that you check blood pressure, blood sugar, or follow special diet at home? Patient reports yes she watches her sodium levels and has an arm cuff she checks with at home.  Do you get any type of exercise on a regular basis? Patient reports she is  using cane when she goes out for extra safety, doing PT exercises still at home once a day.  Can you think of a goal you would like to reach for your health? Patient reports not at this time.  Do you have any problems getting your medications? Patient reports she is happy with [facility] her asthma medications are a little costly.  Is there anything that you would like to discuss during the appointment? Patient report none.   Patient assistance for the following mediations  Patient aware to bring blood pressure cuff, medications that do not need refrigeration and supplements to appointment   Care Gaps: BP- 128/6 (06/29/21) AWV - 03/27/21 COVID Booster #4 - Overdue Flu Vaccine - Overdue   Star Rating Drugs: Losartan (Cozaar) 100 mg - Last filled 08/08/21 90 DS at [facility] Pharmacist Assistant [phone]

## 2021-09-30 NOTE — Progress Notes (Signed)
Chronic Care Management Pharmacy Note  10/03/2021 Name:  Brianna Mccarty MRN:  [id number] DOB:  06/14/1941  Summary: Pt is overdue for lab work  Recommendations/Changes made from today's visit: -Recommended taking omeprazole every other day to see if she tolerates -Scheduled annual lab work for December -Applied for Symbicort PAP -Recommend repeat vitamin D level  Plan: Follow up on results of PAP   Subjective: Brianna Mccarty is an 80 y.o. year old female who is a primary patient of Koberlein, Steele Berg, MD.  The CCM team was consulted for assistance with disease management and care coordination needs.    Engaged with patient by telephone for initial visit in response to provider referral for pharmacy case management and/or care coordination services.   Consent to Services:  The patient was given the following information about Chronic Care Management services today, agreed to services, and gave verbal consent: 1. CCM service includes personalized support from designated clinical staff supervised by the primary care provider, including individualized plan of care and coordination with other care providers 2. 24/7 contact phone numbers for assistance for urgent and routine care needs. 3. Service will only be billed when office clinical staff spend 20 minutes or more in a month to coordinate care. 4. Only one practitioner may furnish and bill the service in a calendar month. 5.The patient may stop CCM services at any time (effective at the end of the month) by phone call to the office staff. 6. The patient will be responsible for cost sharing (co-pay) of up to 20% of the service fee (after annual deductible is met). Patient agreed to services and consent obtained.  Patient Care Team: Caren Macadam, MD as PCP - General (Family Medicine) Dorothy Spark, MD (Inactive) as PCP - Cardiology (Cardiology) Dorothy Spark, MD (Inactive) as Consulting Physician (Cardiology) Ladene Artist, MD as Consulting Physician (Gastroenterology) Festus Aloe, MD as Consulting Physician (Urology) [name], Darrick Grinder, MD as Consulting Physician (Allergy and Immunology) Monna Fam, MD as Consulting Physician (Ophthalmology) [name], MD (Dermatology) [name], MD as Consulting Physician (Orthopedic Surgery) Viona Gilmore, [facility] as Pharmacist (Pharmacist)  Recent office visits: 06/29/21 - Caren Macadam, MD - Patient presented for Pre-op and other concerns. No medication changes.  03/27/21  Willette Brace, LPN - Patient presented for Medicare Annual Wellness exam via video visit. No medication changes.   Recent consult visits: 08/24/21 [name], R - Patient presented for Allergic rhinitis due to pollen. No other details available.   08/15/21 [name], R - Patient presented for Allergic rhinitis due to pollen. No other details available.  07/05/21 Aluisio, Pilar Plate - (Orthopedic Surg) - Patient presented for Right hip pain and osteoarthritis. O other details available.    07/02/21 Leta Baptist - (Otolaryngology) - Patient presented for bilateral impacted cerumen. No other visit details available.   06/08/21 [name] - (Orthopedic Surgery) - Patient presented for Low back pain unspecified. No other visit details available.   06/05/21 [name], R - Patient presented for Moderate persistent Asthma. No other visit details available.  05/18/21 Edmisten, Ok Anis - (Orthopedic Surgery) - Patient presented for Pain in right hip and shoulder. No other visit details available.   04/06/21 Aluisio, Pilar Plate - (Orthopedic Surgery) - Patient presented for Right hip pain. No other visit details available.   03/28/2021 [name] - (Dermatology) - Patient presented for follow up examination after completed treatment for malignant neoplasm and other concerns. No other visit  details available.    Hospital visits: Medication Reconciliation was completed by comparing  discharge summary, patient's EMR and Pharmacy list, and upon discussion with patient. Patient presented to [facility] on 08/01/21 for Right Hip Arthroplasty.Patient was present for 30 hours.     New?Medications Started at [facility] Discharge:?? -started  HYDROcodone-acetaminophen (NORCO/VICODIN) methocarbamol (ROBAXIN) rivaroxaban (XARELTO) traMADol  Medication Changes at Hospital Discharge: -Changed  None   Medications Discontinued at Hospital Discharge: -Stopped  aspirin 81 MG tablet calcium carbonate 1500 (600 Ca) MG Tabs tablet (OSCAL) Cinnamon 500 MG Tabs COQ-10 PO Excedrin Extra Strength 250-250-65 MG tablet (aspirin-acetaminophen-caffeine) ibuprofen 200 MG tablet (ADVIL) ICAPS AREDS 2 PO Magnesium 250 MG Tabs OVER THE COUNTER MEDICATION vitamin C 500 MG tablet (ASCORBIC ACID) Vitamin D3 125 Medications that remain the same after Hospital Discharge:??  -All other medications will remain the same.     Objective:  Lab Results  Component Value Date   CREATININE 0.69 08/02/2021   BUN 13 08/02/2021   GFR 91.84 10/14/2018   GFRNONAA >60 08/02/2021   GFRAA 84 07/24/2020   NA 137 08/02/2021   K 3.4 (L) 08/02/2021   CALCIUM 8.5 (L) 08/02/2021   CO2 25 08/02/2021   GLUCOSE 126 (H) 08/02/2021    Lab Results  Component Value Date/Time   HGBA1C 5.5 05/12/2020 02:48 PM   HGBA1C 5.7 (H) 05/31/2019 09:52 AM   GFR 91.84 10/14/2018 01:49 PM   GFR 83.11 08/10/2018 09:49 AM    Last diabetic Eye exam:  Lab Results  Component Value Date/Time   HMDIABEYEEXA No Retinopathy 07/10/2016 12:00 AM    Last diabetic Foot exam: No results found for: HMDIABFOOTEX   Lab Results  Component Value Date   CHOL 191 05/12/2020   HDL 72 05/12/2020   LDLCALC 105 (H) 05/12/2020   LDLDIRECT 126.6 07/18/2010   TRIG 47 05/12/2020   CHOLHDL 2.7 05/12/2020    Hepatic Function Latest Ref Rng & Units 07/18/2021 07/24/2020 05/12/2020  Total Protein 6.5 - 8.1 g/dL 6.7 6.6 6.5   Albumin 3.5 - 5.0 g/dL 4.2 4.1 -  AST 15 - 41 U/L _0 ALT 0 - 44 U/L _1 Alk Phosphatase 38 - 126 U/L 32(L) 58 -  Total Bilirubin 0.3 - 1.2 mg/dL 0.7 0.4 0.4  Bilirubin, Direct 0.0 - 0.3 mg/dL - - -    Lab Results  Component Value Date/Time   TSH 1.67 05/12/2020 02:48 PM   TSH 3.530 05/31/2019 09:52 AM    CBC Latest Ref Rng & Units 08/02/2021 07/18/2021 07/24/2020  WBC 4.0 - 10.5 K/uL 10.5 5.9 7.1  Hemoglobin 12.0 - 15.0 g/dL 10.9(L) 11.8(L) 11.3  Hematocrit 36.0 - 46.0 % 32.9(L) 36.0 33.7(L)  Platelets 150 - 400 K/uL 206 224 217    Lab Results  Component Value Date/Time   VD25OH 31 05/12/2020 02:48 PM   VD25OH 36.7 05/31/2019 09:52 AM    Clinical ASCVD: No  The ASCVD Risk score (Arnett DK, et al., 2019) failed to calculate for the following reasons:   The 2019 ASCVD risk score is only valid for ages 38 to 76    Depression screen PHQ 2/9 03/27/2021 12/13/2019 12/09/2018  Decreased Interest 0 0 0  Down, Depressed, Hopeless 0 0 0  PHQ - 2 Score 0 0 0  Altered sleeping - - 0  Tired, decreased energy - - 0  Change in appetite - - 0  Feeling bad or failure about yourself  - - 0  Trouble concentrating - - 0  Moving slowly or fidgety/restless - - 0  Suicidal thoughts - - 0  PHQ-9 Score - - 0  Some recent data might be hidden     Social History   Tobacco Use  Smoking Status Never  Smokeless Tobacco Never   BP Readings from Last 3 Encounters:  08/02/21 132/71  07/18/21 (!) 145/76  06/29/21 128/60   Pulse Readings from Last 3 Encounters:  08/02/21 64  07/18/21 66  06/29/21 76   Wt Readings from Last 3 Encounters:  08/01/21 175 lb 0.7 oz (79.4 kg)  07/18/21 175 lb (79.4 kg)  06/29/21 173 lb 6.4 oz (78.7 kg)   BMI Readings from Last 3 Encounters:  08/01/21 33.07 kg/m  07/18/21 33.07 kg/m  06/29/21 32.76 kg/m    Assessment/Interventions: Review of patient past medical history, allergies, medications, health status, including review of consultants  reports, laboratory and other test data, was performed as part of comprehensive evaluation and provision of chronic care management services.   SDOH:  (Social Determinants of Health) assessments and interventions performed: Yes SDOH Interventions    Flowsheet Row Most Recent Value  SDOH Interventions   Financial Strain Interventions Other (Comment)  [working on PAP]  Transportation Interventions Intervention Not Indicated      SDOH Screenings   Alcohol Screen: Not on file  Depression (PHQ2-9): Low Risk    PHQ-2 Score: 0  Financial Resource Strain: Medium Risk   Difficulty of Paying Living Expenses: Somewhat hard  Food Insecurity: No Food Insecurity   Worried About Charity fundraiser in the Last Year: Never true   Ran Out of Food in the Last Year: Never true  Housing: Low Risk    Last Housing Risk Score: 0  Physical Activity: Inactive   Days of Exercise per Week: 0 days   Minutes of Exercise per Session: 0 min  Social Connections: Moderately Integrated   Frequency of Communication with Friends and Family: More than three times a week   Frequency of Social Gatherings with Friends and Family: More than three times a week   Attends Religious Services: More than 4 times per year   Active Member of [employer] or Organizations: No   Attends Music therapist: Never   Marital Status: Married  Stress: No Stress Concern Present   Feeling of Stress : Not at all  Tobacco Use: Low Risk    Smoking Tobacco Use: Never   Smokeless Tobacco Use: Never   Passive Exposure: Not on file  Transportation Needs: No Transportation Needs   Lack of Transportation (Medical): No   Lack of Transportation (Non-Medical): No   Patient reports yes she watches her sodium levels and has an arm cuff she checks with at home.  Patient is busy during most of the day. Patient stays busy with taking care of her sister as Alfredo Bach and bringing her to doctor's appointment. Patient's daughter is in  [place] area and is trying to relocate up here as well, which is keeping her busy.  Patient is very active with family and friends and very involved in church activities.  Patient is doing a lot better with hip replacement. She goes back on the 6th for her next visit with Dr. Maureen Ralphs. Patient is doing PT exercises once a day. Patient does not think this is strenuous exercise and she wants to get back to walking on her treadmill. She is going to inquire about restarting this.   Patient denies any problems with  her current medications. Patient knows what her medications are for.  CCM Care Plan  Allergies  Allergen Reactions   Celebrex [Celecoxib] Diarrhea   Cephalexin     Reaction was a high fever   Hydrochlorothiazide     hyponatremia   Penicillins Hives and Swelling    Swelling of arms & face Has patient had a PCN reaction causing immediate rash, facial/tongue/throat swelling, SOB or lightheadedness with hypotension: Yes Has patient had a PCN reaction causing severe rash involving mucus membranes or skin necrosis: No Has patient had a PCN reaction that required hospitalization: No Has patient had a PCN reaction occurring within the last 10 years: No If all of the above answers are "NO", then may proceed with Cephalosporin use.    Phenobarbital Hives   Statins     Muscle pain   Passion Fruit Flavor Diarrhea and Rash   Tape Rash    MEDICAL TAPE    Medications Reviewed Today     Reviewed by Raenette Rover, CRNA (Certified Registered Nurse Anesthetist) on 08/01/21 at 5  Med List Status: Complete   Medication Order Taking? Sig Documenting Provider Last Dose Status Informant  acyclovir (ZOVIRAX) 400 MG tablet [id number] Yes Take 400 mg by mouth 5 (five) times daily as needed (fever blisters). [provider] Past Week Active Self  albuterol (PROAIR HFA) 108 (90 Base) MCG/ACT inhaler [id number] Yes Inhale 2 puffs into the lungs every 6 (six) hours as needed for wheezing.  Noralee Space, MD 07/31/2021 0900 Active Self  albuterol (PROVENTIL) (2.5 MG/3ML) 0.083% nebulizer solution [id number] No Take 3 mLs (2.5 mg total) by nebulization every 6 (six) hours as needed. Noralee Space, MD More than a month Active Self  alendronate (FOSAMAX) 70 MG tablet [id number] Yes TAKE 1 TABLET EVERY 7 (SEVEN) DAYS. TAKE WITH A FULL GLASS OF WATER ON AN EMPTY STOMACH. Caren Macadam, MD 07/24/2021 Active Self           Med Note Jilda Roche A   Fri Jul 13, 2021 11:56 AM) Tuesdays  amLODipine (NORVASC) 10 MG tablet [id number] Yes TAKE 1 TABLET BY MOUTH EVERY DAY Caren Macadam, MD 08/01/2021 0730 Active Self  Ascorbic Acid (VITAMIN C) 500 MG tablet [id number] Yes Take 500 mg by mouth daily. [provider] Past Week Active Self  aspirin 81 MG tablet [id number] Yes Take 81 mg by mouth every evening. [provider] Past Week Active Self  aspirin-acetaminophen-caffeine Jearld Adjutant EXTRA STRENGTH) [phone] MG tablet [id number] Yes Take 1 tablet by mouth every 6 (six) hours as needed (allergy headaches). [provider] Past Week Active Self  azelastine (ASTELIN) 0.1 % nasal spray [id number] Yes Instill 2 sprays into each nostril two times a day as needed for allergies Scot Jun, FNP Past Month Active Self  Bempedoic Acid (NEXLETOL) 180 MG TABS [id number] Yes Take 1 tablet by mouth daily. Freada Bergeron, MD 07/31/2021 2100 Active Self  betamethasone dipropionate 0.05 % cream [id number] No Apply 1 application topically 2 (two) times daily as needed (scaly skin). [provider] Unknown Active Self  budesonide-formoterol (SYMBICORT) 160-4.5 MCG/ACT inhaler [id number] Yes Inhale 2 puffs into the lungs 2 (two) times daily. Caren Macadam, MD 07/31/2021 2100 Active Self  calcium carbonate (OSCAL) 1500 (600 Ca) MG TABS tablet [id number] Yes Take 1 tablet by mouth every evening. [provider] Past Week Active Self  Cholecalciferol (VITAMIN D3)  5000 UNITS CAPS [id number] Yes Take 5,000 Units by mouth daily. [provider] Past Week Active Self  Cinnamon 500 MG TABS [id number] Yes Take 1 tablet by mouth daily.  [provider] Past Week Active Self  Coenzyme Q10 (COQ-10 PO) [id number] Yes Take 1 tablet by mouth daily. [provider] Past Week Active Self  desmopressin (DDAVP) 0.2 MG tablet [id number] Yes Take 0.2 mg by mouth at bedtime. [provider] 07/31/2021 2100 Active Self  EPINEPHrine 0.3 mg/0.3 mL IJ SOAJ injection [id number] Yes Inject 0.3 mg into the muscle as needed for anaphylaxis. [provider]  Active Self  fexofenadine (ALLEGRA) 180 MG tablet [id number] Yes Take 180 mg by mouth daily. [provider] 08/01/2021 Active Self  furosemide (LASIX) 20 MG tablet [id number] Yes Take 20 mg by mouth daily as needed for edema. [provider] 07/31/2021 Active Self  hydrALAZINE (APRESOLINE) 50 MG tablet [id number] Yes TAKE 1 TAB 3 TIMES A DAY AND TAKE EXTRA TAB IF SBP>160 Freada Bergeron, MD 08/01/2021 0730 Active   hydrOXYzine (ATARAX/VISTARIL) 10 MG tablet [id number] Yes Take 10 mg by mouth at bedtime.  [provider] 08/01/2021 0730 Active Self  ibuprofen (ADVIL) 200 MG tablet [id number] No Take 200 mg by mouth every 6 (six) hours as needed for moderate pain. [provider] More than a month Active Self  losartan (COZAAR) 100 MG tablet [id number] Yes TAKE 1 TABLET BY MOUTH EVERY DAY  Patient taking differently: Take 100 mg by mouth every evening.   Caren Macadam, MD 07/31/2021 2100 Active   Magnesium 250 MG TABS [id number] Yes Take 500 mg by mouth every evening. [provider] Past Week Active Self  METAMUCIL FIBER PO [id number] Yes Take 1 capsule by mouth once a week. [provider] Past Month Active Self  montelukast (SINGULAIR) 10 MG tablet [id number] Yes Take 10 mg by mouth at bedtime. [provider] 07/31/2021 Active Self   Multiple Vitamins-Minerals (ICAPS AREDS 2 PO) [id number] Yes Take 1 capsule by mouth 2 (two) times daily. [provider] Past Week Active Self  omeprazole (PRILOSEC) 40 MG capsule [id number] Yes TAKE 1 CAPSULE BY MOUTH EVERY DAY Caren Macadam, MD 08/01/2021 0730 Active   OVER THE COUNTER MEDICATION [id number] Yes Take 90 mLs by mouth daily. Nopalea otc supplement [provider] Past Week Active Self  OVER THE COUNTER MEDICATION [id number] Yes Take 1 capsule by mouth daily. Juice Plus fruit [provider] Past Week Active Self  OVER THE COUNTER MEDICATION [id number] Yes Take 1 capsule by mouth daily. Juice Plus vegetables [provider] Past Week Active Self  OVER THE COUNTER MEDICATION [id number] Yes Take 1 capsule by mouth daily. Juice Plus berry [provider] Past Week Active Self  OVER THE COUNTER MEDICATION [id number] Yes Take 1 capsule by mouth daily. Juice Plus omega 3 [provider] Past Week Active Self  pentosan polysulfate (ELMIRON) 100 MG capsule [id number] Yes Take 100 mg by mouth daily. [provider] 07/31/2021 2100 Active Self  polyethylene glycol (MIRALAX / GLYCOLAX) 17 g packet [id number] Yes Take 17 g by mouth daily as needed for moderate constipation. [provider] Past Month Active Self  prazosin (MINIPRESS) 5 MG capsule [id number] Yes *NEED OFFICE VISIT* TAKE 1 CAPSULE BY MOUTH TWICE A DAY Caren Macadam, MD 07/31/2021 2100 Active Self  Probiotic Product (PROBIOTIC PO) [id number] Yes Take 1 capsule by mouth daily.  [provider] 07/31/2021 0730 Active Self  Sodium Chloride, Hypertonic, (MURO 128 OP) [id number] Yes Place 1 drop into both eyes daily  as needed (allergies). [provider] Past Week Active Self            Patient Active Problem List   Diagnosis Date Noted   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   Genetic testing  09/13/2020   Family history of pancreatic cancer 09/05/2020   Family history of colon cancer 09/05/2020   Family history of cancer of extrahepatic bile ducts 09/05/2020   Fever 07/26/2020   History of COVID-19 07/26/2020   History of melanoma 05/12/2020   Macular degeneration 02/17/2019   OSA (obstructive sleep apnea) 02/12/2018   Hyperglycemia 02/12/2018   H/O cold sores 11/27/2015   Murmur 10/20/2015   Carotid artery disease ([place])    Thyroid cyst    Venous (peripheral) insufficiency 06/02/2009   Asthma 04/27/2008   Melanoma of skin ([place]) 02/10/2008   Allergic rhinitis 02/10/2008   INTERSTITIAL CYSTITIS 02/10/2008   HYPERCHOLESTEROLEMIA 02/09/2008   Essential hypertension 02/09/2008   GERD 02/09/2008   Osteoporosis 02/09/2008    Immunization History  Administered Date(s) Administered   Fluad Quad(high Dose 65+) 07/02/2019   Influenza Split 09/18/2011, 08/11/2012, 07/28/2014   Influenza Whole 07/21/2008, 08/04/2009, 08/01/2010   Influenza, High Dose Seasonal PF 07/19/2013, 07/28/2015, 10/17/2016, 08/11/2017, 09/01/2017, 08/04/2018, 09/11/2018, 09/01/2019, 06/30/2021   Influenza,inj,Quad PF,6+ Mos 07/03/2016   Moderna Covid-19 Vaccine Bivalent Booster 2yr & up 09/24/2021   PFIZER(Purple Top)SARS-COV-2 Vaccination 11/20/2019, 12/08/2019, 03/22/2020   Pneumococcal Conjugate-13 12/06/2013   Pneumococcal Polysaccharide-23 10/03/2008, 10/17/2016, 11/24/2017, 09/11/2018, 07/12/2019, 09/01/2019   Tdap 11/04/2008, 10/24/2015   Zoster Recombinat (Shingrix) 07/30/2019, 09/03/2019, 02/21/2020   Zoster, Live 11/05/2003    Conditions to be addressed/monitored:  Hypertension, Hyperlipidemia, Asthma, Osteoporosis, Osteoarthritis, and Allergic Rhinitis  Care Plan : [place] Updates made by PViona Gilmore RAmessince 10/03/2021 12:00 AM     Problem: Problem: Hypertension, Hyperlipidemia, Asthma, Osteoporosis, Osteoarthritis, and Allergic Rhinitis      Long-Range  Goal: Patient-Specific Goal   Start Date: 10/01/2021  Expected End Date: 10/01/2022  This Visit's Progress: On track  Priority: High  Note:   Current Barriers:  Unable to independently monitor therapeutic efficacy  Pharmacist Clinical Goal(s):  Patient will achieve adherence to monitoring guidelines and medication adherence to achieve therapeutic efficacy through collaboration with PharmD and provider.   Interventions: 1:1 collaboration with KCaren Macadam MD regarding development and update of comprehensive plan of care as evidenced by provider attestation and co-signature Inter-disciplinary care team collaboration (see longitudinal plan of care) Comprehensive medication review performed; medication list updated in electronic medical record  Hypertension (BP goal <140/90) -Not ideally controlled -Current treatment: Amlodipine 10 mg 1 tablet daily Hydralazine 50 mg 1 tablet three times daily - taking one in morning and one at night Losartan 100 mg 1 tablet daily Prazosin 5 mg one in the morning and one at night -Medications previously tried: /a  -Current home readings: 127/66 (118/50-60s) HR 62 (twice a week) -Current dietary habits: pays close attention to salt intake - eats out often but eats a lot of salads; grilled meat and not beef often; does read package labels -Current exercise habits: not able to right now -Denies hypotensive/hypertensive symptoms -Educated on BP goals and benefits of medications for prevention of heart attack, stroke and kidney damage; Importance of home blood pressure monitoring; Proper BP monitoring technique; Symptoms of hypotension and importance of maintaining adequate hydration; -Counseled to monitor BP at home at least weekly, document, and provide log at future appointments -  Counseled on diet and exercise extensively Recommended to continue current medication  Hyperlipidemia: (LDL goal < 100) -Not ideally controlled -Current  treatment: Nexletol 180 mg 1 tablet daily -Medications previously tried: statins (muscle pain)  -Current dietary patterns: little beef; cooks with olive oil or vegetable oil -Current exercise habits: not able to right now -Educated on Cholesterol goals;  Importance of limiting foods high in cholesterol; -Counseled on diet and exercise extensively Recommended to continue current medication Assessed patient finances. Patient is approved with [employer].  Swelling (Goal: minimize fluid retention) -Controlled -Current treatment  Furosemide 20 mg 1 tablet as needed -Medications previously tried: none  -Recommended to continue current medication Patient only takes this 2-3 times a week.  Asthma (Goal: control symptoms) -Controlled -Current treatment  Albuterol HFA as needed Albuterol nebulizer as needed Symbicort 160-4.5 mcg 2 puffs twice daily Montelukast 10 mg 1 tablet at bedtime -Medications previously tried: unknown  -Pulmonary function testing: n/a -Patient reports consistent use of maintenance inhaler -Frequency of rescue inhaler use: hasn't used since the fall -Counseled on Proper inhaler technique; Benefits of consistent maintenance inhaler use -Counseled on diet and exercise extensively Recommended to continue current medication Assessed patient finances. Apply for Symbicort PAP.  Allergic rhinitis (Goal: minimize symptoms) -Controlled -Current treatment  Azelastine nasal spray as needed Allegra 180 mg 1 tablet daily Saline solution once a day Hydroxyzine 10 mg once at bedtime  -Medications previously tried: n/a  -Recommended to continue current medication  Osteoporosis (Goal prevent fractures) -Controlled -Last DEXA Scan: 05/2020   T-Score femoral neck: -2.0  T-Score total hip: n/a  T-Score lumbar spine: 0.8  T-Score forearm radius: n/a  10-year probability of major osteoporotic fracture: 15%  10-year probability of hip fracture: 4.4% -Patient is a  candidate for pharmacologic treatment due to T-Score -1.0 to -2.5 and 10-year risk of hip fracture > 3% -Current treatment  Alendronate 70 mg 1 tablet weekly - Tuesdays Calcium with vitamin D (400 mg with 12.5 mcg) twice daily -Medications previously tried: none  -Recommend [phone] units of vitamin D daily. Recommend 1200 mg of calcium daily from dietary and supplemental sources. Recommend weight-bearing and muscle strengthening exercises for building and maintaining bone density. -Counseled on diet and exercise extensively Recommended to continue current medication Recommended repeat vitamin D level.  GERD (Goal: minimize symptoms) -Controlled -Current treatment  Omeprazole 40 mg 1 capsule daily -Medications previously tried: none  -Counseled on risks of taking PPIs long term. Recommended every other day use and can supplement with Tums if needed.  Health Maintenance -Vaccine gaps: none -Current therapy:  Metamucil once a week as needed Miralax as needed Probiotic daily Juice plus 2 daily Omegas by juice plus once daily QBC plex - takes it once a week now -Educated on Cost vs benefit of each product must be carefully weighed by individual consumer -Patient is satisfied with current therapy and denies issues -Recommended to continue current medication  Patient Goals/Self-Care Activities Patient will:  - take medications as prescribed as evidenced by patient report and record review check blood pressure at least weekly, document, and provide at future appointments target a minimum of 150 minutes of moderate intensity exercise weekly  Follow Up Plan: The care management team will reach out to the patient again over the next 30 days.        Medication Assistance: Application for Symbicort  medication assistance program. in process.  Anticipated assistance start date 10/03/21.  See plan of care for additional detail.  Compliance/Adherence/Medication fill history:  Care  Gaps: BP- 128/60 (06/29/21)    Star-Rating Drugs: Losartan (Cozaar) 100 mg - [facility]  [id number] [place] [address]Phone: [phone]: [phone] Uses pill box? Yes - uses a tackle box for; then weekly for supplements Pt endorses 99% compliance   We discussed: Benefits of medication synchronization, packaging and delivery as well as enhanced pharmacist oversight with [employer]. Patient decided to: Continue current medication management strategy  Care Plan and Follow Up Patient Decision:  Patient agrees to Care Plan and Follow-up.  Plan: The care management team will reach out to the patient again over the next 30 days.  MJeni Salles PharmD, BColumbusPharmacist [place]at [place]

## 2021-10-01 ENCOUNTER — Ambulatory Visit: Payer: PPO | Admitting: Pharmacist

## 2021-10-01 DIAGNOSIS — M810 Age-related osteoporosis without current pathological fracture: Secondary | ICD-10-CM

## 2021-10-01 DIAGNOSIS — I10 Essential (primary) hypertension: Secondary | ICD-10-CM

## 2021-10-01 DIAGNOSIS — J3089 Other allergic rhinitis: Secondary | ICD-10-CM | POA: Diagnosis not present

## 2021-10-01 DIAGNOSIS — J301 Allergic rhinitis due to pollen: Secondary | ICD-10-CM | POA: Diagnosis not present

## 2021-10-02 DIAGNOSIS — Z08 Encounter for follow-up examination after completed treatment for malignant neoplasm: Secondary | ICD-10-CM | POA: Diagnosis not present

## 2021-10-02 DIAGNOSIS — L821 Other seborrheic keratosis: Secondary | ICD-10-CM | POA: Diagnosis not present

## 2021-10-02 DIAGNOSIS — L304 Erythema intertrigo: Secondary | ICD-10-CM | POA: Diagnosis not present

## 2021-10-02 DIAGNOSIS — Z86006 Personal history of melanoma in-situ: Secondary | ICD-10-CM | POA: Diagnosis not present

## 2021-10-02 DIAGNOSIS — Z1283 Encounter for screening for malignant neoplasm of skin: Secondary | ICD-10-CM | POA: Diagnosis not present

## 2021-10-03 NOTE — Patient Instructions (Signed)
Hi Brianna Mccarty,  It was great to get to meet you over the telephone! Below is a summary of some of the topics we discussed.   As a reminder, I want you to go ahead and try taking the omeprazole every other day and see how things go. The less you need of that medication, the better!  Please reach out to me if you have any questions or need anything before our follow up!  Best, Maddie  Jeni Salles, PharmD, [place] Pharmacist [facility]   Visit Information   Goals Addressed   None    Patient Care Plan: CCM Pharmacy Care Plan     Problem Identified: Problem: Hypertension, Hyperlipidemia, Asthma, Osteoporosis, Osteoarthritis, and Allergic Rhinitis      Long-Range Goal: Patient-Specific Goal   Start Date: 10/01/2021  Expected End Date: 10/01/2022  This Visit's Progress: On track  Priority: High  Note:   Current Barriers:  Unable to independently monitor therapeutic efficacy  Pharmacist Clinical Goal(s):  Patient will achieve adherence to monitoring guidelines and medication adherence to achieve therapeutic efficacy through collaboration with PharmD and provider.   Interventions: 1:1 collaboration with Caren Macadam, MD regarding development and update of comprehensive plan of care as evidenced by provider attestation and co-signature Inter-disciplinary care team collaboration (see longitudinal plan of care) Comprehensive medication review performed; medication list updated in electronic medical record  Hypertension (BP goal <140/90) -Not ideally controlled -Current treatment: Amlodipine 10 mg 1 tablet daily Hydralazine 50 mg 1 tablet three times daily - taking one in morning and one at night Losartan 100 mg 1 tablet daily Prazosin 5 mg one in the morning and one at night -Medications previously tried: /a  -Current home readings: 127/66 (118/50-60s) HR 62 (twice a week) -Current dietary habits: pays close attention to salt  intake - eats out often but eats a lot of salads; grilled meat and not beef often; does read package labels -Current exercise habits: not able to right now -Denies hypotensive/hypertensive symptoms -Educated on BP goals and benefits of medications for prevention of heart attack, stroke and kidney damage; Importance of home blood pressure monitoring; Proper BP monitoring technique; Symptoms of hypotension and importance of maintaining adequate hydration; -Counseled to monitor BP at home at least weekly, document, and provide log at future appointments -Counseled on diet and exercise extensively Recommended to continue current medication  Hyperlipidemia: (LDL goal < 100) -Not ideally controlled -Current treatment: Nexletol 180 mg 1 tablet daily -Medications previously tried: statins (muscle pain)  -Current dietary patterns: little beef; cooks with olive oil or vegetable oil -Current exercise habits: not able to right now -Educated on Cholesterol goals;  Importance of limiting foods high in cholesterol; -Counseled on diet and exercise extensively Recommended to continue current medication Assessed patient finances. Patient is approved with [employer].  Swelling (Goal: minimize fluid retention) -Controlled -Current treatment  Furosemide 20 mg 1 tablet as needed -Medications previously tried: none  -Recommended to continue current medication Patient only takes this 2-3 times a week.  Asthma (Goal: control symptoms) -Controlled -Current treatment  Albuterol HFA as needed Albuterol nebulizer as needed Symbicort 160-4.5 mcg 2 puffs twice daily Montelukast 10 mg 1 tablet at bedtime -Medications previously tried: unknown  -Pulmonary function testing: n/a -Patient reports consistent use of maintenance inhaler -Frequency of rescue inhaler use: hasn't used since the fall -Counseled on Proper inhaler technique; Benefits of consistent maintenance inhaler use -Counseled on diet and  exercise extensively Recommended to continue current medication Assessed  patient finances. Apply for Symbicort PAP.  Allergic rhinitis (Goal: minimize symptoms) -Controlled -Current treatment  Azelastine nasal spray as needed Allegra 180 mg 1 tablet daily Saline solution once a day Hydroxyzine 10 mg once at bedtime  -Medications previously tried: n/a  -Recommended to continue current medication  Osteoporosis (Goal prevent fractures) -Controlled -Last DEXA Scan: 05/2020   T-Score femoral neck: -2.0  T-Score total hip: n/a  T-Score lumbar spine: 0.8  T-Score forearm radius: n/a  10-year probability of major osteoporotic fracture: 15%  10-year probability of hip fracture: 4.4% -Patient is a candidate for pharmacologic treatment due to T-Score -1.0 to -2.5 and 10-year risk of hip fracture > 3% -Current treatment  Alendronate 70 mg 1 tablet weekly - Tuesdays Calcium with vitamin D (400 mg with 12.5 mcg) twice daily -Medications previously tried: none  -Recommend [phone] units of vitamin D daily. Recommend 1200 mg of calcium daily from dietary and supplemental sources. Recommend weight-bearing and muscle strengthening exercises for building and maintaining bone density. -Counseled on diet and exercise extensively Recommended to continue current medication Recommended repeat vitamin D level.  GERD (Goal: minimize symptoms) -Controlled -Current treatment  Omeprazole 40 mg 1 capsule daily -Medications previously tried: none  -Counseled on risks of taking PPIs long term. Recommended every other day use and can supplement with Tums if needed.  Health Maintenance -Vaccine gaps: none -Current therapy:  Metamucil once a week as needed Miralax as needed Probiotic daily Juice plus 2 daily Omegas by juice plus once daily QBC plex - takes it once a week now -Educated on Cost vs benefit of each product must be carefully weighed by individual consumer -Patient is satisfied with current  therapy and denies issues -Recommended to continue current medication  Patient Goals/Self-Care Activities Patient will:  - take medications as prescribed as evidenced by patient report and record review check blood pressure at least weekly, document, and provide at future appointments target a minimum of 150 minutes of moderate intensity exercise weekly  Follow Up Plan: The care management team will reach out to the patient again over the next 30 days.       Ms. Dedic was given information about Chronic Care Management services today including:  CCM service includes personalized support from designated clinical staff supervised by her physician, including individualized plan of care and coordination with other care providers 24/7 contact phone numbers for assistance for urgent and routine care needs. Standard insurance, coinsurance, copays and deductibles apply for chronic care management only during months in which we provide at least 20 minutes of these services. Most insurances cover these services at 100%, however patients may be responsible for any copay, coinsurance and/or deductible if applicable. This service may help you avoid the need for more expensive face-to-face services. Only one practitioner may furnish and bill the service in a calendar month. The patient may stop CCM services at any time (effective at the end of the month) by phone call to the office staff.  Patient agreed to services and verbal consent obtained.   The patient verbalized understanding of instructions, educational materials, and care plan provided today and agreed to receive a mailed copy of patient instructions, educational materials, and care plan.  Telephone follow up appointment with pharmacy team member scheduled for: 6 months  Viona Gilmore, [facility]

## 2021-10-05 ENCOUNTER — Other Ambulatory Visit: Payer: Self-pay | Admitting: Family Medicine

## 2021-10-05 ENCOUNTER — Telehealth: Payer: Self-pay | Admitting: Cardiology

## 2021-10-05 MED ORDER — NEXLETOL 180 MG PO TABS: 1.0000 | ORAL_TABLET | Freq: Every day | ORAL | 1 refills | Status: DC

## 2021-10-05 MED ORDER — BUDESONIDE-FORMOTEROL FUMARATE 160-4.5 MCG/ACT IN AERO
2.0000 | INHALATION_SPRAY | Freq: Two times a day (BID) | RESPIRATORY_TRACT | 3 refills | Status: DC
Start: 2021-10-05 — End: 2024-03-03

## 2021-10-05 NOTE — Telephone Encounter (Signed)
*  STAT* If patient is at the pharmacy, call can be transferred to refill team.   1. Which medications need to be refilled? (please list name of each medication and dose if known) Bempedoic Acid (NEXLETOL) 180 MG TABS  2. Which pharmacy/location (including street and city if local pharmacy) is medication to be sent to? [id number] [place] [place] [address]  3. Do they need a 30 day or 90 day supply? 90 day

## 2021-10-05 NOTE — Telephone Encounter (Signed)
Pt's medication was sent to pt's pharmacy as requested. Confirmation received.  °

## 2021-10-09 DIAGNOSIS — J3089 Other allergic rhinitis: Secondary | ICD-10-CM | POA: Diagnosis not present

## 2021-10-09 DIAGNOSIS — J301 Allergic rhinitis due to pollen: Secondary | ICD-10-CM | POA: Diagnosis not present

## 2021-10-15 ENCOUNTER — Other Ambulatory Visit: Payer: PPO

## 2021-10-16 DIAGNOSIS — M542 Cervicalgia: Secondary | ICD-10-CM | POA: Diagnosis not present

## 2021-10-17 ENCOUNTER — Ambulatory Visit: Payer: PPO

## 2021-10-18 DIAGNOSIS — M542 Cervicalgia: Secondary | ICD-10-CM | POA: Diagnosis not present

## 2021-10-18 DIAGNOSIS — M25512 Pain in left shoulder: Secondary | ICD-10-CM | POA: Diagnosis not present

## 2021-10-21 ENCOUNTER — Other Ambulatory Visit: Payer: Self-pay

## 2021-10-21 ENCOUNTER — Emergency Department (HOSPITAL_COMMUNITY)
Admission: EM | Admit: 2021-10-21 | Discharge: 2021-10-21 | Disposition: A | Discharge status: Home / Self Care | Payer: PPO | Attending: Emergency Medicine | Admitting: Emergency Medicine

## 2021-10-21 ENCOUNTER — Encounter (HOSPITAL_COMMUNITY): Payer: Self-pay | Admitting: Emergency Medicine

## 2021-10-21 ENCOUNTER — Emergency Department (HOSPITAL_COMMUNITY): Payer: PPO

## 2021-10-21 DIAGNOSIS — R0602 Shortness of breath: Secondary | ICD-10-CM | POA: Diagnosis not present

## 2021-10-21 DIAGNOSIS — Z8616 Personal history of COVID-19: Secondary | ICD-10-CM | POA: Diagnosis not present

## 2021-10-21 DIAGNOSIS — R079 Chest pain, unspecified: Secondary | ICD-10-CM | POA: Diagnosis not present

## 2021-10-21 DIAGNOSIS — K219 Gastro-esophageal reflux disease without esophagitis: Secondary | ICD-10-CM | POA: Diagnosis not present

## 2021-10-21 DIAGNOSIS — R072 Precordial pain: Secondary | ICD-10-CM | POA: Diagnosis not present

## 2021-10-21 DIAGNOSIS — J45909 Unspecified asthma, uncomplicated: Secondary | ICD-10-CM | POA: Insufficient documentation

## 2021-10-21 DIAGNOSIS — I10 Essential (primary) hypertension: Secondary | ICD-10-CM | POA: Insufficient documentation

## 2021-10-21 DIAGNOSIS — I2510 Atherosclerotic heart disease of native coronary artery without angina pectoris: Secondary | ICD-10-CM | POA: Insufficient documentation

## 2021-10-21 DIAGNOSIS — Z85820 Personal history of malignant melanoma of skin: Secondary | ICD-10-CM | POA: Insufficient documentation

## 2021-10-21 DIAGNOSIS — R0789 Other chest pain: Secondary | ICD-10-CM | POA: Diagnosis not present

## 2021-10-21 DIAGNOSIS — R002 Palpitations: Secondary | ICD-10-CM

## 2021-10-21 DIAGNOSIS — Z96643 Presence of artificial hip joint, bilateral: Secondary | ICD-10-CM | POA: Insufficient documentation

## 2021-10-21 DIAGNOSIS — Z79899 Other long term (current) drug therapy: Secondary | ICD-10-CM | POA: Insufficient documentation

## 2021-10-21 LAB — COMPREHENSIVE METABOLIC PANEL
ALT: 16 U/L (ref 0–44)
AST: 17 U/L (ref 15–41)
Albumin: 4 g/dL (ref 3.5–5.0)
Alkaline Phosphatase: 50 U/L (ref 38–126)
Anion gap: 8 (ref 5–15)
BUN: 21 mg/dL (ref 8–23)
CO2: 23 mmol/L (ref 22–32)
Calcium: 9.2 mg/dL (ref 8.9–10.3)
Chloride: 104 mmol/L (ref 98–111)
Creatinine, Ser: 0.68 mg/dL (ref 0.44–1.00)
GFR, Estimated: >60 mL/min (ref >60)
Glucose, Bld: 119 mg/dL — ABNORMAL HIGH (ref 70–99)
Potassium: 3.8 mmol/L (ref 3.5–5.1)
Sodium: 135 mmol/L (ref 135–145)
Total Bilirubin: 0.8 mg/dL (ref 0.3–1.2)
Total Protein: 6.8 g/dL (ref 6.5–8.1)

## 2021-10-21 LAB — CBC
HCT: 37.9 % (ref 36.0–46.0)
Hemoglobin: 12.3 g/dL (ref 12.0–15.0)
MCH: 28.7 pg (ref 26.0–34.0)
MCHC: 32.5 g/dL (ref 30.0–36.0)
MCV: 88.3 fL (ref 80.0–100.0)
Platelets: 244 10*3/uL (ref 150–400)
RBC: 4.29 MIL/uL (ref 3.87–5.11)
RDW: 13.5 % (ref 11.5–15.5)
WBC: 7.3 10*3/uL (ref 4.0–10.5)
nRBC: 0 % (ref 0.0–0.2)

## 2021-10-21 LAB — TROPONIN I (HIGH SENSITIVITY)
Troponin I (High Sensitivity): 7 ng/L (ref <18)
Troponin I (High Sensitivity): 7 ng/L (ref <18)

## 2021-10-21 NOTE — ED Provider Notes (Signed)
Emergency Medicine Provider Triage Evaluation Note  Brianna Mccarty , a 80 y.o. female  was evaluated in triage.  Pt complains of palpitations and hypertension. States that last night when she was getting ready for bed she felt her heart start racing and states it has persisted since onset. She checked her heart rate and said it was in the 140s. She then checked her blood pressure and states it was in the 190s. She also endorses some mild chest pain and shortness of breath. No history of afib.  Review of Systems  Positive:  Negative: See above  Physical Exam  BP (!) 161/85    Pulse (!) 102    Resp 16    SpO2 100%  Gen:   Awake, no distress   Resp:  Normal effort  MSK:   Moves extremities without difficulty  Other:  Irregularly irregular heart rate  Medical Decision Making  Medically screening exam initiated at 8:46 AM.  Appropriate orders placed.  Brianna Mccarty was informed that the remainder of the evaluation will be completed by another provider, this initial triage assessment does not replace that evaluation, and the importance of remaining in the ED until their evaluation is complete.     Nestor Lewandowsky 10/21/21 8527    Pattricia Boss, MD 10/22/21 [phone]

## 2021-10-21 NOTE — Discharge Instructions (Signed)
Call on Monday to set up an appointment to be followed by cardiology.  Certainly if he had more palpitations home monitoring would be appropriate.  Return for any chest pain that last 20 minutes or longer.  Today's work-up for the chest pain without any acute findings.  The cardiac enzyme called a troponin was normal x2 which is very reassuring.  Your blood pressures been fine here.  So your blood pressure medications seem to be working well.

## 2021-10-21 NOTE — ED Provider Notes (Signed)
[facility] EMERGENCY DEPARTMENT Provider Note   CSN: [id number] Arrival date & time: 10/21/21  6301     History Chief Complaint  Patient presents with   Chest Pain   Hypertension    Brianna Mccarty is a 80 y.o. female.  Patient when she went to bed at 1230 felt palpitations.  Felt like her heart was going fast.  At 8:30 in the morning she experienced tightness feeling in the substernal area.  Patient did have a hip replacement back in September and did go through a stress test at that time which did not have any significant findings.  Patient states she has had an echo with some mild heart valve abnormalities.  Is followed by [facility].  Patient also got concerned because during the night her blood pressure was very high and she took an additional hydralazine.  She took her blood pressure medicines this morning.  Blood pressure here is very reassuring at 138/66.  She still has some of that discomfort in the substernal area.  But it is very mild.      Past Medical History:  Diagnosis Date   Abnormal EKG    Normal LV function in the past   Allergic rhinitis    Asthma    Bronchitis, mucopurulent recurrent (HCC)    Carotid artery disease (HCC)    a. mild by carotid duplex.   Cervical dysplasia 1971   Coronary artery disease    Diverticulosis of colon    DJD (degenerative joint disease)    Family history of cancer of extrahepatic bile ducts 09/05/2020   Family history of colon cancer 09/05/2020   Family history of pancreatic cancer 09/05/2020   GERD (gastroesophageal reflux disease)    Headache(784.0)    History of kidney stones    Hypercholesterolemia    Hypertension    Interstitial cystitis    sees urologist   Lichen sclerosus    Malignant melanoma ([place]) 2006   sees Dr. 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   Genetic testing 09/13/2020   Family history of pancreatic cancer 09/05/2020   Family history of colon cancer 09/05/2020   Family history of cancer of extrahepatic bile ducts 09/05/2020   Fever 07/26/2020   History of COVID-19 07/26/2020   History of melanoma 05/12/2020   Macular degeneration 02/17/2019   OSA (obstructive sleep apnea) 02/12/2018   Hyperglycemia 02/12/2018   H/O cold sores 11/27/2015   Murmur 10/20/2015   Carotid artery disease ([place])    Thyroid cyst    Venous (peripheral) insufficiency 06/02/2009   Asthma 04/27/2008   Melanoma of skin ([place]) 02/10/2008   Allergic rhinitis 02/10/2008   INTERSTITIAL CYSTITIS 02/10/2008   HYPERCHOLESTEROLEMIA 02/09/2008   Essential hypertension 02/09/2008   GERD 02/09/2008   Osteoporosis 02/09/2008    Past Surgical History:  Procedure Laterality Date   BREAST BIOPSY Left 11/25/2011   U/S core, benign performed at [facility], LEFT BREAT MARKER IN   BREAST CYST ASPIRATION     BREAST SURGERY  2013   Breast Bx-Benign   CARDIAC CATHETERIZATION     CATARACT EXTRACTION, BILATERAL     CHOLECYSTECTOMY N/A 12/22/2019   Procedure: LAPAROSCOPIC CHOLECYSTECTOMY;  Surgeon: Ileana Roup, MD;  Location: [place] [facility];  Service: General;  Laterality: N/A;   cystoscopy and basket stone removal right ureter  02/2006   Dr. Robbi Garter SURGERY  12/22/2019   [place]   left total hip replacement  2004   Dr. Gladstone Lighter   melanoma removed from medial rleft knee area  2006   Dr. [place] Crane   NASAL SEPTUM SURGERY     TOTAL HIP  ARTHROPLASTY Right 08/01/2021   Procedure: [place];  Surgeon: Gaynelle Arabian, MD;  Location: [facility];  Service: Orthopedics;  Laterality: Right;     OB History     Gravida  2   Para  2   Term  2   Preterm      AB      Living  2      SAB      IAB      Ectopic      Multiple      Live Births              Family History  Problem Relation Age of Onset   Pancreatic cancer Father 17   Diabetes Mother    Heart disease Mother    Cancer Brother 11       Bile duct   Diabetes Brother    Hypertension Brother    Diabetes Brother    Heart disease Brother    Hypertension Brother    Hypertension Sister    Hypertension Sister    Colon cancer Paternal Uncle        dx 56s   Lung cancer Paternal Uncle        dx 64s    Social History   Tobacco Use   Smoking status: Never   Smokeless tobacco: Never  Vaping Use   Vaping Use: Never used  Substance Use Topics   Alcohol use: Yes    Alcohol/week: 1.0 standard drink    Types: 1 Standard drinks or equivalent per week    Comment:  OCC WINE   Drug use: No    Home Medications Prior to Admission medications   Medication Sig Start Date End Date Taking? Authorizing Provider  acyclovir (ZOVIRAX) 400 MG tablet Take 400 mg by mouth 5 (five) times daily as needed (fever blisters).    [provider]  albuterol (PROAIR HFA) 108 (90 Base) MCG/ACT inhaler Inhale 2 puffs into the lungs every 6 (six) hours as needed for wheezing. 09/10/18   Noralee Space, MD  albuterol (PROVENTIL) (2.5 MG/3ML) 0.083% nebulizer solution Take 3 mLs (2.5 mg total) by nebulization every 6 (six) hours as needed. 09/10/18 07/09/29  Noralee Space, MD  alendronate (FOSAMAX) 70 MG tablet TAKE 1 TABLET EVERY 7 (SEVEN) DAYS. TAKE WITH A FULL GLASS OF WATER ON AN EMPTY STOMACH. 05/06/21   Koberlein, Andris Flurry C, MD  amLODipine (NORVASC) 10 MG tablet TAKE 1 TABLET BY MOUTH EVERY DAY 07/02/21   Koberlein, Steele Berg, MD  azelastine  (ASTELIN) 0.1 % nasal spray Instill 2 sprays into each nostril two times a day as  needed for allergies 02/03/21   Scot Jun, FNP  Bempedoic Acid (NEXLETOL) 180 MG TABS Take 1 tablet by mouth daily. 10/05/21   Freada Bergeron, MD  betamethasone dipropionate 0.05 % cream Apply 1 application topically 2 (two) times daily as needed (scaly skin). 03/29/21   [provider]  budesonide-formoterol (SYMBICORT) 160-4.5 MCG/ACT inhaler Inhale 2 puffs into the lungs 2 (two) times daily. 10/05/21   Caren Macadam, MD  Cholecalciferol 125 MCG (5000 UT) TABS Take 1 tablet by mouth daily.    [provider]  desmopressin (DDAVP) 0.2 MG tablet Take 0.2 mg by mouth at bedtime.    [provider]  EPINEPHrine 0.3 mg/0.3 mL IJ SOAJ injection Inject 0.3 mg into the muscle as needed for anaphylaxis. 12/21/18   [provider]  fexofenadine (ALLEGRA) 180 MG tablet Take 180 mg by mouth daily.    [provider]  furosemide (LASIX) 20 MG tablet Take 20 mg by mouth daily as needed for edema.    [provider]  hydrALAZINE (APRESOLINE) 50 MG tablet TAKE 1 TAB 3 TIMES A DAY AND TAKE EXTRA TAB IF SBP>160 07/16/21   Freada Bergeron, MD  hydrOXYzine (ATARAX/VISTARIL) 10 MG tablet Take 10 mg by mouth at bedtime.     [provider]  losartan (COZAAR) 100 MG tablet TAKE 1 TABLET BY MOUTH EVERY DAY Patient taking differently: Take 100 mg by mouth every evening. 07/02/21   Koberlein, Steele Berg, MD  METAMUCIL FIBER PO Take 1 capsule by mouth once a week.    [provider]  montelukast (SINGULAIR) 10 MG tablet Take 10 mg by mouth at bedtime.    [provider]  omeprazole (PRILOSEC) 40 MG capsule TAKE 1 CAPSULE BY MOUTH EVERY DAY 07/13/21   Koberlein, Steele Berg, MD  pentosan polysulfate (ELMIRON) 100 MG capsule Take 100 mg by mouth daily.    [provider]  polyethylene glycol (MIRALAX / GLYCOLAX) 17 g packet Take 17 g by mouth  daily as needed for moderate constipation.    [provider]  prazosin (MINIPRESS) 5 MG capsule *NEED OFFICE VISIT* TAKE 1 CAPSULE BY MOUTH TWICE A DAY 08/20/21   Koberlein, Steele Berg, MD  Probiotic Product (PROBIOTIC PO) Take 1 capsule by mouth daily.     [provider]  Sodium Chloride, Hypertonic, (MURO 128 OP) Place 1 drop into both eyes daily as needed (allergies).    [provider]    Allergies    Celebrex [celecoxib], Cephalexin, Hydrochlorothiazide, Penicillins, Phenobarbital, Statins, Passion fruit flavor, and Tape  Review of Systems   Review of Systems  Constitutional:  Negative for chills and fever.  HENT:  Negative for ear pain and sore throat.   Eyes:  Negative for pain and visual disturbance.  Respiratory:  Negative for cough and shortness of breath.   Cardiovascular:  Positive for chest pain and palpitations. Negative for leg swelling.  Gastrointestinal:  Negative for abdominal pain and vomiting.  Genitourinary:  Negative for dysuria and hematuria.  Musculoskeletal:  Negative for arthralgias and back pain.  Skin:  Negative for color change and rash.  Neurological:  Negative for seizures and syncope.  All other systems reviewed and are negative.  Physical Exam Updated Vital Signs BP 139/64    Pulse (!) 57    Temp 98.3 F (36.8 C)    Resp 14    SpO2 98%   Physical Exam Vitals and nursing note reviewed.  Constitutional:  General: She is not in acute distress.    Appearance: Normal appearance. She is well-developed.  HENT:     Head: Normocephalic and atraumatic.  Eyes:     Extraocular Movements: Extraocular movements intact.     Conjunctiva/sclera: Conjunctivae normal.     Pupils: Pupils are equal, round, and reactive to light.  Cardiovascular:     Rate and Rhythm: Normal rate and regular rhythm.     Heart sounds: No murmur heard. Pulmonary:     Effort: Pulmonary effort is normal. No respiratory distress.     Breath sounds:  Normal breath sounds.  Abdominal:     Palpations: Abdomen is soft.     Tenderness: There is no abdominal tenderness.  Musculoskeletal:        General: No swelling.     Cervical back: Neck supple.     Right lower leg: No edema.     Left lower leg: No edema.  Skin:    General: Skin is warm and dry.     Capillary Refill: Capillary refill takes less than 2 seconds.  Neurological:     General: No focal deficit present.     Mental Status: She is alert and oriented to person, place, and time.  Psychiatric:        Mood and Affect: Mood normal.    ED Results / Procedures / Treatments   Labs (all labs ordered are listed, but only abnormal results are displayed) Labs Reviewed  COMPREHENSIVE METABOLIC PANEL - Abnormal; Notable for the following components:      Result Value   Glucose, Bld 119 (*)    All other components within normal limits  CBC  TROPONIN I (HIGH SENSITIVITY)  TROPONIN I (HIGH SENSITIVITY)    EKG None  Radiology DG Chest 2 View  Result Date: 10/21/2021 CLINICAL DATA:  Chest pain and shortness of breath. EXAM: CHEST - 2 VIEW COMPARISON:  08/14/2020. FINDINGS: Mild enlargement of the cardiac silhouette, stable. No mediastinal or hilar masses. No evidence of adenopathy. Lungs are hyperexpanded, but clear. No pleural effusion or pneumothorax. Skeletal structures are intact. IMPRESSION: No acute cardiopulmonary disease. Electronically Signed   By: Lajean Manes M.D.   On: 10/21/2021 09:34    Procedures Procedures   Medications Ordered in ED Medications - No data to display  ED Course  I have reviewed the triage vital signs and the nursing notes.  Pertinent labs & imaging results that were available during my care of the patient were reviewed by me and considered in my medical decision making (see chart for details).    MDM Rules/Calculators/A&P                          Patient blood pressure here is very remarkable.  Cardiac monitoring here is without any  arrhythmias.  EKG without any acute changes.  Patient does states she did have history at one time where she was told she had some atrial fibs that could have been what occurred last night.  Patient's labs without any significant abnormalities.  Complete metabolic panel without any significant abnormalities.  Chest x-ray no acute cardiopulmonary process.  Troponins are 7x2 2 hours apart which is also very reassuring.  Think patient will be able to be discharged home follow back up with cardiology for consideration for home monitoring.  For evaluation of the chest pain and returning for any new or worse symptoms.  Patient's delta troponins were 7 and 7.  Patient without  any worsening chest pain no palpitations while here.  We will have her follow-up with cardiology.  Patient will return for any chest pain that last 20 minutes or longer.  Patient will return for rapid heart rate that lasted 40 minutes or longer  Final Clinical Impression(s) / ED Diagnoses Final diagnoses:  Palpitations  Precordial pain    Rx / DC Orders ED Discharge Orders     None        Fredia Sorrow, MD 10/21/21 1414

## 2021-10-21 NOTE — ED Triage Notes (Addendum)
Pt reports heart racing at 1am when she was lying down to go to bed.  Reports HR 140s and BP 195/ so she took hydralazine.  C/o sob and chest tightness this morning.  Denies nausea and vomiting.  States BP 198/96 this morning.  Took her 3 HTN meds this morning.

## 2021-10-21 NOTE — ED Notes (Signed)
Pt was able to ambulate to the restroom.  

## 2021-10-22 ENCOUNTER — Telehealth: Payer: Self-pay | Admitting: Family Medicine

## 2021-10-22 DIAGNOSIS — J301 Allergic rhinitis due to pollen: Secondary | ICD-10-CM | POA: Diagnosis not present

## 2021-10-22 DIAGNOSIS — J3089 Other allergic rhinitis: Secondary | ICD-10-CM | POA: Diagnosis not present

## 2021-10-22 NOTE — Telephone Encounter (Signed)
FYI pt is calling to let dr Ethlyn Gallery know she went to [facility] for elevated bp and will follow up with her cardiologist

## 2021-10-22 NOTE — Telephone Encounter (Signed)
Noted  

## 2021-10-28 ENCOUNTER — Other Ambulatory Visit: Payer: Self-pay | Admitting: Family Medicine

## 2021-11-01 DIAGNOSIS — J301 Allergic rhinitis due to pollen: Secondary | ICD-10-CM | POA: Diagnosis not present

## 2021-11-01 DIAGNOSIS — J3089 Other allergic rhinitis: Secondary | ICD-10-CM | POA: Diagnosis not present

## 2021-11-02 DIAGNOSIS — H35373 Puckering of macula, bilateral: Secondary | ICD-10-CM | POA: Diagnosis not present

## 2021-11-02 DIAGNOSIS — H04123 Dry eye syndrome of bilateral lacrimal glands: Secondary | ICD-10-CM | POA: Diagnosis not present

## 2021-11-02 DIAGNOSIS — Z961 Presence of intraocular lens: Secondary | ICD-10-CM | POA: Diagnosis not present

## 2021-11-02 DIAGNOSIS — H40013 Open angle with borderline findings, low risk, bilateral: Secondary | ICD-10-CM | POA: Diagnosis not present

## 2021-11-02 DIAGNOSIS — H524 Presbyopia: Secondary | ICD-10-CM | POA: Diagnosis not present

## 2021-11-08 DIAGNOSIS — J301 Allergic rhinitis due to pollen: Secondary | ICD-10-CM | POA: Diagnosis not present

## 2021-11-08 DIAGNOSIS — J3089 Other allergic rhinitis: Secondary | ICD-10-CM | POA: Diagnosis not present

## 2021-11-09 ENCOUNTER — Other Ambulatory Visit (INDEPENDENT_AMBULATORY_CARE_PROVIDER_SITE_OTHER): Payer: PPO

## 2021-11-09 DIAGNOSIS — R739 Hyperglycemia, unspecified: Secondary | ICD-10-CM | POA: Diagnosis not present

## 2021-11-09 DIAGNOSIS — E7800 Pure hypercholesterolemia, unspecified: Secondary | ICD-10-CM

## 2021-11-09 DIAGNOSIS — M810 Age-related osteoporosis without current pathological fracture: Secondary | ICD-10-CM

## 2021-11-09 LAB — LIPID PANEL
Cholesterol: 267 mg/dL — ABNORMAL HIGH (ref 0–200)
HDL: 85.7 mg/dL (ref >39.00)
LDL Cholesterol: 173 mg/dL — ABNORMAL HIGH (ref 0–99)
NonHDL: 181.61
Total CHOL/HDL Ratio: 3
Triglycerides: 42 mg/dL (ref 0.0–149.0)
VLDL: 8.4 mg/dL (ref 0.0–40.0)

## 2021-11-09 LAB — HEMOGLOBIN A1C: Hgb A1c MFr Bld: 5.9 % (ref 4.6–6.5)

## 2021-11-09 LAB — VITAMIN D 25 HYDROXY (VIT D DEFICIENCY, FRACTURES): VITD: 29.28 ng/mL — ABNORMAL LOW (ref 30.00–100.00)

## 2021-11-09 LAB — TSH: TSH: 0.97 u[IU]/mL (ref 0.35–5.50)

## 2021-11-09 NOTE — Addendum Note (Signed)
Addended by: Rosalyn Gess D on: 11/09/2021 09:38 AM   Modules accepted: Orders

## 2021-11-19 DIAGNOSIS — J3089 Other allergic rhinitis: Secondary | ICD-10-CM | POA: Diagnosis not present

## 2021-11-19 DIAGNOSIS — J301 Allergic rhinitis due to pollen: Secondary | ICD-10-CM | POA: Diagnosis not present

## 2021-11-22 DIAGNOSIS — N3946 Mixed incontinence: Secondary | ICD-10-CM | POA: Diagnosis not present

## 2021-11-22 DIAGNOSIS — N281 Cyst of kidney, acquired: Secondary | ICD-10-CM | POA: Diagnosis not present

## 2021-11-22 DIAGNOSIS — N3010 Interstitial cystitis (chronic) without hematuria: Secondary | ICD-10-CM | POA: Diagnosis not present

## 2021-12-04 DIAGNOSIS — J301 Allergic rhinitis due to pollen: Secondary | ICD-10-CM | POA: Diagnosis not present

## 2021-12-04 DIAGNOSIS — J3089 Other allergic rhinitis: Secondary | ICD-10-CM | POA: Diagnosis not present

## 2021-12-06 ENCOUNTER — Telehealth: Payer: Self-pay | Admitting: Pharmacist

## 2021-12-06 NOTE — Chronic Care Management (AMB) (Signed)
Chronic Care Management Pharmacy Assistant   Name: Brianna Mccarty  MRN: [id number] DOB: 1941-07-28  Reason for Encounter: Disease State Hypertension Assessment   Conditions to be addressed/monitored: HTN  Recent office visits:  None  Recent consult visits:  10/22/21 Brianna Mccarty  - Patient presented for Allergy injection  10/09/21  Brianna Mccarty  - Patient presented for Allergy injection  10/02/21 Brianna Mccarty (Dermatology) - Patient presented for Seborrheic Keratosis and other concerned.   Hospital visits:  Medication Reconciliation was completed by comparing discharge summary, patients EMR and Pharmacy list, and upon discussion with patient. Patient presented to [facility] ED on 10/21/21 due to Palpitations and other concerns. Patient was present for 6 hours.   New?Medications Started at [facility] Discharge:?? -started  None  Medication Changes at Hospital Discharge: -Changed  None  Medications Discontinued at Hospital Discharge: -Stopped  None  Medications that remain the same after Hospital Discharge:??  -All other medications will remain the same.    Medications: Outpatient Encounter Medications as of 12/06/2021  Medication Sig Note   acyclovir (ZOVIRAX) 400 MG tablet Take 400 mg by mouth 5 (five) times daily as needed (fever blisters).    albuterol (PROAIR HFA) 108 (90 Base) MCG/ACT inhaler Inhale 2 puffs into the lungs every 6 (six) hours as needed for wheezing.    albuterol (PROVENTIL) (2.5 MG/3ML) 0.083% nebulizer solution Take 3 mLs (2.5 mg total) by nebulization every 6 (six) hours as needed.    alendronate (FOSAMAX) 70 MG tablet TAKE 1 TABLET EVERY 7 (SEVEN) DAYS. TAKE WITH A FULL GLASS OF WATER ON AN EMPTY STOMACH. 07/13/2021: Tuesdays   amLODipine (NORVASC) 10 MG tablet TAKE 1 TABLET BY MOUTH EVERY DAY    azelastine (ASTELIN) 0.1 % nasal spray Instill 2 sprays into each nostril two times a day as needed for allergies     Bempedoic Acid (NEXLETOL) 180 MG TABS Take 1 tablet by mouth daily.    betamethasone dipropionate 0.05 % cream Apply 1 application topically 2 (two) times daily as needed (scaly skin).    budesonide-formoterol (SYMBICORT) 160-4.5 MCG/ACT inhaler Inhale 2 puffs into the lungs 2 (two) times daily.    Cholecalciferol 125 MCG (5000 UT) TABS Take 1 tablet by mouth daily.    desmopressin (DDAVP) 0.2 MG tablet Take 0.2 mg by mouth at bedtime.    EPINEPHrine 0.3 mg/0.3 mL IJ SOAJ injection Inject 0.3 mg into the muscle as needed for anaphylaxis.    fexofenadine (ALLEGRA) 180 MG tablet Take 180 mg by mouth daily.    furosemide (LASIX) 20 MG tablet Take 20 mg by mouth daily as needed for edema.    hydrALAZINE (APRESOLINE) 50 MG tablet TAKE 1 TAB 3 TIMES A DAY AND TAKE EXTRA TAB IF SBP>160    hydrOXYzine (ATARAX/VISTARIL) 10 MG tablet Take 10 mg by mouth at bedtime.     losartan (COZAAR) 100 MG tablet TAKE 1 TABLET BY MOUTH EVERY DAY (Patient taking differently: Take 100 mg by mouth every evening.)    METAMUCIL FIBER PO Take 1 capsule by mouth once a week.    montelukast (SINGULAIR) 10 MG tablet Take 10 mg by mouth at bedtime.    omeprazole (PRILOSEC) 40 MG capsule TAKE 1 CAPSULE BY MOUTH EVERY DAY    pentosan polysulfate (ELMIRON) 100 MG capsule Take 100 mg by mouth daily.    polyethylene glycol (MIRALAX / GLYCOLAX) 17 g packet Take 17 g by mouth daily as needed for moderate constipation.  prazosin (MINIPRESS) 5 MG capsule *NEED OFFICE VISIT* TAKE 1 CAPSULE BY MOUTH TWICE A DAY    Probiotic Product (PROBIOTIC PO) Take 1 capsule by mouth daily.     Sodium Chloride, Hypertonic, (MURO 128 OP) Place 1 drop into both eyes daily as needed (allergies).    No facility-administered encounter medications on file as of 12/06/2021.  Reviewed chart prior to disease state call. Spoke with patient regarding BP  Recent Office Vitals: BP Readings from Last 3 Encounters:  10/21/21 (!) 123/58  08/02/21 132/71   07/18/21 (!) 145/76   Pulse Readings from Last 3 Encounters:  10/21/21 (!) 59  08/02/21 64  07/18/21 66    Wt Readings from Last 3 Encounters:  08/01/21 175 lb 0.7 oz (79.4 kg)  07/18/21 175 lb (79.4 kg)  06/29/21 173 lb 6.4 oz (78.7 kg)     Kidney Function Lab Results  Component Value Date/Time   CREATININE 0.68 10/21/2021 08:51 AM   CREATININE 0.69 08/02/2021 03:17 AM   CREATININE 0.88 05/12/2020 02:48 PM   CREATININE 1.14 (H) 12/21/2015 12:42 PM   GFR 91.84 10/14/2018 01:49 PM   GFRNONAA >60 10/21/2021 08:51 AM   GFRAA 84 07/24/2020 03:56 PM    BMP Latest Ref Rng & Units 10/21/2021 08/02/2021 07/18/2021  Glucose 70 - 99 mg/dL 119(H) 126(H) 101(H)  BUN 8 - 23 mg/dL 21 13 18   Creatinine 0.44 - 1.00 mg/dL 0.68 0.69 0.73  BUN/Creat Ratio 12 - 28 - - -  Sodium 135 - 145 mmol/L 135 137 136  Potassium 3.5 - 5.1 mmol/L 3.8 3.4(L) 3.6  Chloride 98 - 111 mmol/L 104 106 101  CO2 22 - 32 mmol/L 23 25 25   Calcium 8.9 - 10.3 mg/dL 9.2 8.5(L) 9.4    Current antihypertensive regimen:  Amlodipine 10 mg 1 tablet daily Hydralazine 50 mg 1 tablet three times daily - taking one in morning and one at night Losartan 100 mg 1 tablet daily Prazosin 5 mg one in the morning and one at night How often are you checking your Blood Pressure? 1-2x per week Current home BP readings: Patient reports she has been remaining near or at 120/60 pulse of 50, she denies any low readings lightheadedness or dizziness What recent interventions/DTPs have been made by any provider to improve Blood Pressure control since last CPP Visit: Patient reports no changes Any recent hospitalizations or ED visits since last visit with CPP? None What exercise is being done to improve your Blood Pressure Control?  Patient reports she is walking well was currently on her way to visit with her daughter.  Adherence Review: Is the patient currently on ACE/ARB medication? Yes Does the patient have >5 day gap between last  estimated fill dates? No    Care Gaps: BP- 120/60  home 12/06/21 AWV - 03/27/21 COVID Booster #4 - Overdue Flu Vaccine - Overdue  Star Rating Drugs: Losartan (Cozaar) 100 mg - Last filled 11/05/21 90 DS at [facility] Pharmacist Assistant [phone]

## 2021-12-11 DIAGNOSIS — J301 Allergic rhinitis due to pollen: Secondary | ICD-10-CM | POA: Diagnosis not present

## 2021-12-11 DIAGNOSIS — J3089 Other allergic rhinitis: Secondary | ICD-10-CM | POA: Diagnosis not present

## 2021-12-13 ENCOUNTER — Other Ambulatory Visit: Payer: Self-pay | Admitting: Cardiology

## 2021-12-13 MED ORDER — FUROSEMIDE 20 MG PO TABS: 20.0000 mg | ORAL_TABLET | Freq: Every day | ORAL | 3 refills | Status: DC | PRN

## 2021-12-13 NOTE — Telephone Encounter (Signed)
Pt is requesting a refill on furosemide. Pt's PCP has been refilling this medication. Would Dr. Johney Frame like to refill this medication? Please address

## 2021-12-13 NOTE — Telephone Encounter (Signed)
°*  STAT* If patient is at the pharmacy, call can be transferred to refill team.   1. Which medications need to be refilled? (please list name of each medication and dose if known)  furosemide (LASIX) 20 MG tablet  2. Which pharmacy/location (including street and city if local pharmacy) is medication to be sent to? [id number] - [place], [place]  3. Do they need a 30 day or 90 day supply?  90 day supply.  Patient is completely out of medication.

## 2021-12-17 DIAGNOSIS — J3089 Other allergic rhinitis: Secondary | ICD-10-CM | POA: Diagnosis not present

## 2021-12-17 DIAGNOSIS — J301 Allergic rhinitis due to pollen: Secondary | ICD-10-CM | POA: Diagnosis not present

## 2021-12-24 DIAGNOSIS — J3089 Other allergic rhinitis: Secondary | ICD-10-CM | POA: Diagnosis not present

## 2021-12-24 DIAGNOSIS — J301 Allergic rhinitis due to pollen: Secondary | ICD-10-CM | POA: Diagnosis not present

## 2021-12-28 DIAGNOSIS — I781 Nevus, non-neoplastic: Secondary | ICD-10-CM | POA: Diagnosis not present

## 2021-12-28 DIAGNOSIS — L308 Other specified dermatitis: Secondary | ICD-10-CM | POA: Diagnosis not present

## 2021-12-31 DIAGNOSIS — H6123 Impacted cerumen, bilateral: Secondary | ICD-10-CM | POA: Diagnosis not present

## 2022-01-03 DIAGNOSIS — J301 Allergic rhinitis due to pollen: Secondary | ICD-10-CM | POA: Diagnosis not present

## 2022-01-03 DIAGNOSIS — J3089 Other allergic rhinitis: Secondary | ICD-10-CM | POA: Diagnosis not present

## 2022-01-07 DIAGNOSIS — J3089 Other allergic rhinitis: Secondary | ICD-10-CM | POA: Diagnosis not present

## 2022-01-07 DIAGNOSIS — J3081 Allergic rhinitis due to animal (cat) (dog) hair and dander: Secondary | ICD-10-CM | POA: Diagnosis not present

## 2022-01-07 DIAGNOSIS — J301 Allergic rhinitis due to pollen: Secondary | ICD-10-CM | POA: Diagnosis not present

## 2022-01-15 ENCOUNTER — Encounter: Payer: Self-pay | Admitting: Physician Assistant

## 2022-01-15 ENCOUNTER — Other Ambulatory Visit: Payer: Self-pay | Admitting: Family Medicine

## 2022-01-15 DIAGNOSIS — R002 Palpitations: Secondary | ICD-10-CM | POA: Insufficient documentation

## 2022-01-15 DIAGNOSIS — I350 Nonrheumatic aortic (valve) stenosis: Secondary | ICD-10-CM | POA: Insufficient documentation

## 2022-01-15 DIAGNOSIS — I77810 Thoracic aortic ectasia: Secondary | ICD-10-CM

## 2022-01-15 HISTORY — DX: Thoracic aortic ectasia: I77.810

## 2022-01-15 NOTE — Progress Notes (Signed)
?Cardiology Office Note:   ? ?Date:  01/16/2022  ? ?ID:  LUMI WINSLETT, DOB 1941-04-30, MRN [id number] ? ?PCP:  Caren Macadam, MD  ?[facility] Providers ?Cardiologist:  Freada Bergeron, MD    ?Referring MD: Caren Macadam, MD  ? ?Chief Complaint:  F/u for AS, HTN and Palpitations ?  ? 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 ?Prior CV Studies: ?Myoview 06/06/21 ?EF 74, normal perfusion; low risk ? ?Echocardiogram 04/23/21 ?EF 60-65, no RWMA, mild conc LVH, Gr 2 DD, normal RVSF, normal PASP, severe LAE, mild RAE, mild MR, trivial AI, mild AS (mean 10.5 mmHg, Vmax 226.5 cm/s, DI 0.54), mild dilation of asc aorta (40 mm) ? ?Carotid US 06/29/19 ?RICA 3-24; LICA 4-01 ? ? ?History of Present Illness:   ?Brianna Mccarty is a 81 y.o. female with the above problem list.  She was last seen by Dr. Johney Frame in 5/22.   She was seen in the ED in 12/22 with palpitations.  Her EKG demonstrated NSR.  Her hs-Trops were neg.  She is here alone.  She feels her visit the ED in Dec was related to stress.  Her daughter just moved in with her.  Her daughter has a lot of health issues and cannot care for herself any longer.  She notes that the post op staff told her that she has a lot of extra beats after her hip surgery.  She was told that it was not clear if she was in AFib or not.  She has not had any recent palpitations.  She did have PACs on her EKG in the ED in Dec.  She has not had chest pain, significant shortness of breath, syncope, orthopnea, leg edema.   ?   ?Past Medical History:  ?Diagnosis Date  ? Abnormal EKG   ? Normal LV function in the past  ? Allergic rhinitis   ? Ascending aorta dilation ([place]) 01/15/2022  ? Echocardiogram 6/22: 40 mm  ? Asthma   ? Bronchitis, mucopurulent recurrent ([place])   ? Carotid artery disease ([place])   ? a. mild by carotid duplex.  ? Cervical  dysplasia 1971  ? Coronary artery disease   ? Diverticulosis of colon   ? DJD (degenerative joint disease)   ? Family history of cancer of extrahepatic bile ducts 09/05/2020  ? Family history of colon cancer 09/05/2020  ? Family history of pancreatic cancer 09/05/2020  ? GERD (gastroesophageal reflux disease)   ? Headache(784.0)   ? History of kidney stones   ? Hypercholesterolemia   ? Hypertension   ? Interstitial cystitis   ? sees urologist  ? Lichen sclerosus   ? Malignant melanoma ([place]) 2006  ? sees Dr. [place] Crane in dermatology  ? Migraines   ? Mild aortic stenosis   ? Mitral valve disease   ? Question mitral valve prolapse in the past, no prolapse by echo 2009  ? Murmur 10/20/2015  ? SEES DR Meda Coffee  ? OSA (obstructive sleep apnea)   ? USES DENTAL DEVICE  ? Osteoporosis   ? on fosomax > 5 years, stopped 11/2015  ? Pneumonia   ? Thyroid cyst   ? 1 x 1.1 thyroid cyst noted on carotid Doppler, January, 2012  ? Venous insufficiency   ? ?Current Medications: ?Current Meds  ?Medication Sig  ? acyclovir (ZOVIRAX) 400 MG tablet Take 400 mg by mouth  5 (five) times daily as needed (fever blisters).  ? albuterol (PROAIR HFA) 108 (90 Base) MCG/ACT inhaler Inhale 2 puffs into the lungs every 6 (six) hours as needed for wheezing.  ? albuterol (PROVENTIL) (2.5 MG/3ML) 0.083% nebulizer solution Take 3 mLs (2.5 mg total) by nebulization every 6 (six) hours as needed.  ? alendronate (FOSAMAX) 70 MG tablet TAKE 1 TABLET EVERY 7 (SEVEN) DAYS. TAKE WITH A FULL GLASS OF WATER ON AN EMPTY STOMACH.  ? amLODipine (NORVASC) 10 MG tablet TAKE 1 TABLET BY MOUTH EVERY DAY  ? azelastine (ASTELIN) 0.1 % nasal spray Instill 2 sprays into each nostril two times a day as needed for allergies  ? Bempedoic Acid (NEXLETOL) 180 MG TABS Take 1 tablet by mouth daily.  ? betamethasone dipropionate 0.05 % cream Apply 1 application topically 2 (two) times daily as needed (scaly skin).  ? budesonide-formoterol (SYMBICORT) 160-4.5 MCG/ACT inhaler Inhale 2 puffs  into the lungs 2 (two) times daily.  ? Cholecalciferol 125 MCG (5000 UT) TABS Take 1 tablet by mouth daily.  ? desmopressin (DDAVP) 0.2 MG tablet Take 0.2 mg by mouth at bedtime.  ? EPINEPHrine 0.3 mg/0.3 mL IJ SOAJ injection Inject 0.3 mg into the muscle as needed for anaphylaxis.  ? fexofenadine (ALLEGRA) 180 MG tablet Take 180 mg by mouth daily.  ? furosemide (LASIX) 20 MG tablet Take 1 tablet (20 mg total) by mouth daily as needed for edema.  ? hydrALAZINE (APRESOLINE) 50 MG tablet TAKE 1 TAB 3 TIMES A DAY AND TAKE EXTRA TAB IF SBP>160 (Patient taking differently: TAKE 1 TAB 2 TIMES A DAY AND TAKE EXTRA TAB IF SBP>160)  ? hydrOXYzine (ATARAX/VISTARIL) 10 MG tablet Take 10 mg by mouth at bedtime.   ? losartan (COZAAR) 100 MG tablet TAKE 1 TABLET BY MOUTH EVERY DAY (Patient taking differently: Take 100 mg by mouth every evening.)  ? METAMUCIL FIBER PO Take 1 capsule by mouth once a week.  ? montelukast (SINGULAIR) 10 MG tablet Take 10 mg by mouth at bedtime.  ? omeprazole (PRILOSEC) 40 MG capsule TAKE 1 CAPSULE BY MOUTH EVERY DAY  ? pentosan polysulfate (ELMIRON) 100 MG capsule Take 100 mg by mouth daily.  ? polyethylene glycol (MIRALAX / GLYCOLAX) 17 g packet Take 17 g by mouth daily as needed for moderate constipation.  ? prazosin (MINIPRESS) 5 MG capsule *NEED OFFICE VISIT* TAKE 1 CAPSULE BY MOUTH TWICE A DAY  ? Probiotic Product (PROBIOTIC PO) Take 1 capsule by mouth daily.   ? Sodium Chloride, Hypertonic, (MURO 128 OP) Place 1 drop into both eyes daily as needed (allergies).  ?  ?Allergies:   Celebrex [celecoxib], Cephalexin, Hydrochlorothiazide, Penicillins, Phenobarbital, Statins, Passion fruit flavor, and Tape  ? ?Social History  ? ?Tobacco Use  ? Smoking status: Never  ? Smokeless tobacco: Never  ?Vaping Use  ? Vaping Use: Never used  ?Substance Use Topics  ? Alcohol use: Yes  ?  Alcohol/week: 1.0 standard drink  ?  Types: 1 Standard drinks or equivalent per week  ?  Comment:  OCC WINE  ? Drug use: No  ?   ?Family Hx: ?The patient's family history includes Cancer (age of onset: 30) in her brother; Colon cancer in her paternal uncle; Diabetes in her brother, brother, and mother; Heart disease in her brother and mother; Hypertension in her brother, brother, sister, and sister; Lung cancer in her paternal uncle; Pancreatic cancer (age of onset: 52) in her father. ? ?Review of Systems  ?Gastrointestinal:  Negative for hematochezia.  ?Genitourinary:  Negative for hematuria.   ? ?EKGs/Labs/Other Test Reviewed:   ? ?EKG:  EKG is not ordered today.  The ekg ordered today demonstrates n/a ? ?Recent Labs: ?10/21/2021: ALT 16; BUN 21; Creatinine, Ser 0.68; Hemoglobin 12.3; Platelets 244; Potassium 3.8; Sodium 135 ?11/09/2021: TSH 0.97  ? ?Recent Lipid Panel ?Recent Labs  ?  11/09/21 ?0938  ?CHOL 267*  ?TRIG 42.0  ?HDL 85.70  ?VLDL 8.4  ?LDLCALC 173*  ?  ? ?Risk Assessment/Calculations:   ?  ?    ?Physical Exam:   ? ?VS:  BP 136/78   Pulse 65   Ht '5\' 1"'$  (1.549 m)   Wt 155 lb 6.4 oz (70.5 kg)   SpO2 100%   BMI 29.36 kg/m?    ? ?Wt Readings from Last 3 Encounters:  ?01/16/22 155 lb 6.4 oz (70.5 kg)  ?08/01/21 175 lb 0.7 oz (79.4 kg)  ?07/18/21 175 lb (79.4 kg)  ?  ?Constitutional:   ?   Appearance: Healthy appearance. Not in distress.  ?Neck:  ?   Vascular: No JVR. JVD normal.  ?Pulmonary:  ?   Effort: Pulmonary effort is normal.  ?   Breath sounds: No wheezing. No rales.  ?Cardiovascular:  ?   Normal rate. Regular rhythm. Normal S1. Normal S2.   ?   Murmurs: There is a grade 2/6 crescendo-decrescendo systolic murmur at the URSB.  ?Edema: ?   Peripheral edema absent.  ?Abdominal:  ?   Palpations: Abdomen is soft.  ?Skin: ?   General: Skin is warm and dry.  ?Neurological:  ?   Mental Status: Alert and oriented to person, place and time.  ?   Cranial Nerves: Cranial nerves are intact.  ?  ?    ?ASSESSMENT & PLAN:   ?Palpitations ?She went to the ED in Dec for elevated HR and BP.  She was told after her hip surgery that she had a  lot of extra beats and the staff was not able to tell if she was in AFib or not.  I think it is worthwhile to screen for atrial fibrillation with a 14 Zio patch monitor.   ?Zio XT x 14 days ? ?Mild aortic stenos

## 2022-01-16 ENCOUNTER — Ambulatory Visit (INDEPENDENT_AMBULATORY_CARE_PROVIDER_SITE_OTHER): Payer: PPO

## 2022-01-16 ENCOUNTER — Encounter: Payer: Self-pay | Admitting: Physician Assistant

## 2022-01-16 ENCOUNTER — Ambulatory Visit (INDEPENDENT_AMBULATORY_CARE_PROVIDER_SITE_OTHER): Payer: PPO | Admitting: Physician Assistant

## 2022-01-16 ENCOUNTER — Other Ambulatory Visit: Payer: Self-pay

## 2022-01-16 VITALS — BP 136/78 | HR 65 | Ht 61.0 in | Wt 155.4 lb

## 2022-01-16 DIAGNOSIS — I10 Essential (primary) hypertension: Secondary | ICD-10-CM

## 2022-01-16 DIAGNOSIS — I77810 Thoracic aortic ectasia: Secondary | ICD-10-CM

## 2022-01-16 DIAGNOSIS — R002 Palpitations: Secondary | ICD-10-CM

## 2022-01-16 DIAGNOSIS — I350 Nonrheumatic aortic (valve) stenosis: Secondary | ICD-10-CM | POA: Diagnosis not present

## 2022-01-16 DIAGNOSIS — E7800 Pure hypercholesterolemia, unspecified: Secondary | ICD-10-CM

## 2022-01-16 DIAGNOSIS — I6523 Occlusion and stenosis of bilateral carotid arteries: Secondary | ICD-10-CM

## 2022-01-16 NOTE — Assessment & Plan Note (Signed)
She brings in a list of her home BPs. Most of these are optimal.  She has had some lightheadedness when her diastolic is in the 74B. I have asked her to hydrate well to avoid this. Continue amlodipine 10 mg once daily, hydralazine 50 mg twice daily + extra prn, losartan 100 mg once daily, prazosin 5 mg once daily. ?

## 2022-01-16 NOTE — Assessment & Plan Note (Signed)
Lipids uncontrolled on recent labs.  She has not been as active since her hip surgery and would like to recheck her panel prior to seeing the lipid clinic for consideration of PCSK9i.   ?? CMET, Lipids in 3-4 mos ?

## 2022-01-16 NOTE — Progress Notes (Unsigned)
Applied a 14 day Zio XT monitor to patient in the office  Dr Pemberton to read 

## 2022-01-16 NOTE — Patient Instructions (Addendum)
Medication Instructions:  ?Your physician recommends that you continue on your current medications as directed. Please refer to the Current Medication list given to you today. ? ?*If you need a refill on your cardiac medications before your next appointment, please call your pharmacy* ? ? ?Lab Work: ?04/24/22:  come to the office for labwork, make sure you are fasting, anytime between 7:15-5:00:  CMET & LIPID ? ?If you have labs (blood work) drawn today and your tests are completely normal, you will receive your results only by: ?MyChart Message (if you have MyChart) OR ?A paper copy in the mail ?If you have any lab test that is abnormal or we need to change your treatment, we will call you to review the results. ? ? ?Testing/Procedures: ?ZIO XT- Long Term Monitor Instructions ? ?Your physician has requested you wear a ZIO patch monitor for 14 days.  ?This is a single patch monitor. [employer] supplies one patch monitor per enrollment. Additional ?stickers are not available. Please do not apply patch if you will be having a Nuclear Stress Test,  ?Echocardiogram, Cardiac CT, MRI, or Chest Xray during the period you would be wearing the  ?monitor. The patch cannot be worn during these tests. You cannot remove and re-apply the  ?ZIO XT patch monitor.  ? ?Billing and Patient Assistance Program Information ? ?We have supplied [employer] with any of your insurance information on file for billing purposes. ?[employer] offers a sliding scale Patient Assistance Program for patients that do not have  ?insurance, or whose insurance does not completely cover the cost of the ZIO monitor.  ?You must apply for the Patient Assistance Program to qualify for this discounted rate.  ?To apply, please call [employer] at [phone], select option 4, select option 2, ask to apply for  ?Patient Assistance Program. Theodore Demark will ask your household income, and how many people  ?are in your household. They will quote your out-of-pocket cost based on that  information.  ?[employer] will also be able to set up a 38-month interest-free payment plan if needed. ? ?Applying the monitor ?  ?Shave hair from upper left chest.  ?Hold abrader disc by orange tab. Rub abrader in 40 strokes over the upper left chest as  ?indicated in your monitor instructions.  ?Clean area with 4 enclosed alcohol pads. Let dry.  ?Apply patch as indicated in monitor instructions. Patch will be placed under collarbone on left  ?side of chest with arrow pointing upward.  ?Rub patch adhesive wings for 2 minutes. Remove white label marked "1". Remove the white  ?label marked "2". Rub patch adhesive wings for 2 additional minutes.  ?While looking in a mirror, press and release button in center of patch. A small green light will  ?flash 3-4 times. This will be your only indicator that the monitor has been turned on.  ?Do not shower for the first 24 hours. You may shower after the first 24 hours.  ?Press the button if you feel a symptom. You will hear a small click. Record Date, Time and  ?Symptom in the Patient Logbook.  ?When you are ready to remove the patch, follow instructions on the last 2 pages of Patient  ?Logbook. Stick patch monitor onto the last page of Patient Logbook.  ?Place Patient Logbook in the blue and white box. Use locking tab on box and tape box closed  ?securely. The blue and white box has prepaid postage on it. Please place it in the mailbox as  ?soon as possible.  Your physician should have your test results approximately 7 days after the  ?monitor has been mailed back to [facility].  ?Call [facility] at [phone] if you have questions regarding  ?your ZIO XT patch monitor. Call them immediately if you see an orange light blinking on your  ?monitor.  ?If your monitor falls off in less than 4 days, contact our Monitor department at [phone].  ?If your monitor becomes loose or falls off after 4 days call [employer] at [phone] for  ?suggestions on  securing your monitor ? ? ? ?Follow-Up: ?At [facility], you and your health needs are our priority.  As part of our continuing mission to provide you with exceptional heart care, we have created designated Provider Care Teams.  These Care Teams include your primary Cardiologist (physician) and Advanced Practice Providers (APPs -  Physician Assistants and Nurse Practitioners) who all work together to provide you with the care you need, when you need it. ? ?We recommend signing up for the patient portal called "MyChart".  Sign up information is provided on this After Visit Summary.  MyChart is used to connect with patients for Virtual Visits (Telemedicine).  Patients are able to view lab/test results, encounter notes, upcoming appointments, etc.  Non-urgent messages can be sent to your provider as well.   ?To learn more about what you can do with MyChart, go to [email or web address].   ? ?Your next appointment:   ?6 month(s) ? ?The format for your next appointment:   ?In Person ? ?Provider:   ?Freada Bergeron, MD   ? ? ?Other Instructions ? ?

## 2022-01-16 NOTE — Assessment & Plan Note (Signed)
Mild by echocardiogram in June of 22. She is not having symptoms to suggest worsening.  She will need a f/u echocardiogram in 1-2 years. ?

## 2022-01-16 NOTE — Assessment & Plan Note (Signed)
She went to the ED in Dec for elevated HR and BP.  She was told after her hip surgery that she had a lot of extra beats and the staff was not able to tell if she was in AFib or not.  I think it is worthwhile to screen for atrial fibrillation with a 14 Zio patch monitor.   ?? Zio XT x 14 days ?

## 2022-01-16 NOTE — Assessment & Plan Note (Signed)
40 mm by recent echocardiogram.   CTA is pending in June 2023. ?

## 2022-01-23 DIAGNOSIS — J301 Allergic rhinitis due to pollen: Secondary | ICD-10-CM | POA: Diagnosis not present

## 2022-01-23 DIAGNOSIS — J3089 Other allergic rhinitis: Secondary | ICD-10-CM | POA: Diagnosis not present

## 2022-01-28 DIAGNOSIS — J3089 Other allergic rhinitis: Secondary | ICD-10-CM | POA: Diagnosis not present

## 2022-01-28 DIAGNOSIS — J301 Allergic rhinitis due to pollen: Secondary | ICD-10-CM | POA: Diagnosis not present

## 2022-01-30 ENCOUNTER — Encounter (INDEPENDENT_AMBULATORY_CARE_PROVIDER_SITE_OTHER): Payer: PPO | Admitting: Ophthalmology

## 2022-01-30 DIAGNOSIS — H35033 Hypertensive retinopathy, bilateral: Secondary | ICD-10-CM | POA: Diagnosis not present

## 2022-01-30 DIAGNOSIS — I10 Essential (primary) hypertension: Secondary | ICD-10-CM

## 2022-01-30 DIAGNOSIS — H35341 Macular cyst, hole, or pseudohole, right eye: Secondary | ICD-10-CM | POA: Diagnosis not present

## 2022-01-30 DIAGNOSIS — H353132 Nonexudative age-related macular degeneration, bilateral, intermediate dry stage: Secondary | ICD-10-CM

## 2022-01-30 DIAGNOSIS — D3132 Benign neoplasm of left choroid: Secondary | ICD-10-CM | POA: Diagnosis not present

## 2022-01-30 DIAGNOSIS — H33303 Unspecified retinal break, bilateral: Secondary | ICD-10-CM

## 2022-01-30 DIAGNOSIS — D3131 Benign neoplasm of right choroid: Secondary | ICD-10-CM | POA: Diagnosis not present

## 2022-01-30 DIAGNOSIS — H43813 Vitreous degeneration, bilateral: Secondary | ICD-10-CM | POA: Diagnosis not present

## 2022-01-31 ENCOUNTER — Encounter: Payer: Self-pay | Admitting: Adult Health

## 2022-01-31 ENCOUNTER — Ambulatory Visit (INDEPENDENT_AMBULATORY_CARE_PROVIDER_SITE_OTHER): Payer: PPO | Admitting: Adult Health

## 2022-01-31 VITALS — BP 108/60 | HR 62 | Temp 98.4°F | Ht 61.0 in | Wt 166.0 lb

## 2022-01-31 DIAGNOSIS — J4541 Moderate persistent asthma with (acute) exacerbation: Secondary | ICD-10-CM | POA: Diagnosis not present

## 2022-01-31 DIAGNOSIS — H6982 Other specified disorders of Eustachian tube, left ear: Secondary | ICD-10-CM

## 2022-01-31 MED ORDER — PREDNISONE 10 MG PO TABS: ORAL_TABLET | ORAL | 0 refills | Status: DC

## 2022-01-31 NOTE — Progress Notes (Signed)
? ?Subjective:  ? ? Patient ID: Brianna Mccarty, female    DOB: [birth year], 81 y.o.   MRN: [id number] ? 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), Asthma, Bronchitis, mucopurulent recurrent ([place]), Carotid artery disease ([place]), Cervical dysplasia (1971), Coronary artery disease, Diverticulosis of colon, DJD (degenerative joint disease), Family history of cancer of extrahepatic bile ducts (09/05/2020), Family history of colon cancer (09/05/2020), Family history of pancreatic cancer (09/05/2020), GERD (gastroesophageal reflux disease), Headache(784.0), History of kidney stones, Hypercholesterolemia, Hypertension, Interstitial cystitis, Lichen sclerosus, Malignant melanoma ([place]) (2006), Migraines, Mild aortic stenosis, Mitral valve disease, Murmur (10/20/2015), OSA (obstructive sleep apnea), Osteoporosis, Pneumonia, Thyroid cyst, and Venous insufficiency. ? ?She is a patient of Dr. Ethlyn Gallery who I am seeing today for an acute issue. Her symptoms have been present for one week. Symptoms include feeling of left ear fullness, ear pain, and " gargling" sounds in her left ear. She does report that her symptoms have started to improve over the last few days  ? ?She also reports worsening asthma and has been having to use breathing treatments on a daily basis.  ? ?She does receive allergy shots at [facility]. She has been taking her medications and using inhalers as directed. She has not started Astelin nasal spray just yet though  ? ?Associated symptoms include itchy watery eyes.  ? ?Review of Systems  ?All other systems reviewed and are negative. ?See HPI  ? ?Past Medical History:  ?Diagnosis Date  ? Abnormal EKG   ? Normal LV function in the past  ? Allergic rhinitis   ? Ascending aorta dilation ([place]) 01/15/2022  ? Echocardiogram 6/22: 40 mm  ? Asthma   ? Bronchitis, mucopurulent recurrent ([place])   ? Carotid artery  disease ([place])   ? a. mild by carotid duplex.  ? Cervical dysplasia 1971  ? Coronary artery disease   ? Diverticulosis of colon   ? DJD (degenerative joint disease)   ? Family history of cancer of extrahepatic bile ducts 09/05/2020  ? Family history of colon cancer 09/05/2020  ? Family history of pancreatic cancer 09/05/2020  ? GERD (gastroesophageal reflux disease)   ? Headache(784.0)   ? History of kidney stones   ? Hypercholesterolemia   ? Hypertension   ? Interstitial cystitis   ? sees urologist  ? Lichen sclerosus   ? Malignant melanoma ([place]) 2006  ? sees Dr. Nevada Crane in dermatology  ? Migraines   ? Mild aortic stenosis   ? Mitral valve disease   ? Question mitral valve prolapse in the past, no prolapse by echo 2009  ? Murmur 10/20/2015  ? SEES DR Meda Coffee  ? OSA (obstructive sleep apnea)   ? USES DENTAL DEVICE  ? Osteoporosis   ? on fosomax > 5 years, stopped 11/2015  ? Pneumonia   ? Thyroid cyst   ? 1 x 1.1 thyroid cyst noted on carotid Doppler, January, 2012  ? Venous insufficiency   ? ? ?Social History  ? ?Socioeconomic History  ? Marital status: Married  ?  Spouse name: Edison Nasuti  ? Number of children: 2  ? Years of education: Not on file  ? Highest education level: Not on file  ?Occupational History  ? Occupation: retired  ?  Employer: RETIRED  ?Tobacco Use  ? Smoking status: Never  ? Smokeless tobacco: Never  ?Vaping Use  ? Vaping Use: Never used  ?Substance and Sexual Activity  ?  Alcohol use: Yes  ?  Alcohol/week: 1.0 standard drink  ?  Types: 1 Standard drinks or equivalent per week  ?  Comment:  OCC WINE  ? Drug use: No  ? Sexual activity: Not Currently  ?  Birth control/protection: Post-menopausal  ?  Comment: 1st intercourse 54 yo-1 partner  ?Other Topics Concern  ? Not on file  ?Social History Narrative  ? Updated 12/13/2019  ? Work or School: none  ?   ? Home Situation: lives with husband  ?   ? Spiritual Beliefs: christian  ?   ? Lifestyle: regular exercise; healthy diet  ?   ? 12/13/2019: Uses treadmill 3/x  weekly.   ? Looks after sister who has been having memory decline, as well  as husband with minor memory decline.   ? Enjoys writing poetry, reading, going to church, travel  ? ?Social Determinants of Health  ? ?Financial Resource Strain: Medium Risk  ? Difficulty of Paying Living Expenses: Somewhat hard  ?Food Insecurity: No Food Insecurity  ? Worried About Charity fundraiser in the Last Year: Never true  ? Ran Out of Food in the Last Year: Never true  ?Transportation Needs: No Transportation Needs  ? Lack of Transportation (Medical): No  ? Lack of Transportation (Non-Medical): No  ?Physical Activity: Inactive  ? Days of Exercise per Week: 0 days  ? Minutes of Exercise per Session: 0 min  ?Stress: No Stress Concern Present  ? Feeling of Stress : Not at all  ?Social Connections: Moderately Integrated  ? Frequency of Communication with Friends and Family: More than three times a week  ? Frequency of Social Gatherings with Friends and Family: More than three times a week  ? Attends Religious Services: More than 4 times per year  ? Active Member of Clubs or Organizations: No  ? Attends Archivist Meetings: Never  ? Marital Status: Married  ?Intimate Partner Violence: Not At Risk  ? Fear of Current or Ex-Partner: No  ? Emotionally Abused: No  ? Physically Abused: No  ? Sexually Abused: No  ? ? ?Past Surgical History:  ?Procedure Laterality Date  ? BREAST BIOPSY Left 11/25/2011  ? U/S core, benign performed at [facility] [place]  ? BREAST CYST ASPIRATION    ? BREAST SURGERY  2013  ? Breast Bx-Benign  ? CARDIAC CATHETERIZATION    ? CATARACT EXTRACTION, BILATERAL    ? CHOLECYSTECTOMY N/A 12/22/2019  ? Procedure: LAPAROSCOPIC CHOLECYSTECTOMY;  Surgeon: Ileana Roup, MD;  Location: [facility];  Service: General;  Laterality: N/A;  ? cystoscopy and basket stone removal right ureter  02/2006  ? Dr. Terance Hart  ? GALLBLADDER SURGERY  12/22/2019  ? GYNECOLOGIC CRYOSURGERY  1971  ? left  total hip replacement  2004  ? Dr. Gladstone Lighter  ? melanoma removed from medial rleft knee area  2006  ? Dr. Nevada Crane  ? NASAL SEPTUM SURGERY    ? TOTAL HIP ARTHROPLASTY Right 08/01/2021  ? Procedure: TOTAL HIP ARTHROPLASTY ANTERIOR APPROACH;  Surgeon: Gaynelle Arabian, MD;  Location: [facility];  Service: Orthopedics;  Laterality: Right;  ? ? ?Family History  ?Problem Relation Age of Onset  ? Pancreatic cancer Father 33  ? Diabetes Mother   ? Heart disease Mother   ? Cancer Brother 26  ?     Bile duct  ? Diabetes Brother   ? Hypertension Brother   ? Diabetes Brother   ? Heart disease Brother   ?  Hypertension Brother   ? Hypertension Sister   ? Hypertension Sister   ? Colon cancer Paternal Uncle   ?     dx 2s  ? Lung cancer Paternal Uncle   ?     dx 23s  ? ? ?Allergies  ?Allergen Reactions  ? Celebrex [Celecoxib] Diarrhea  ? Cephalexin   ?  Reaction was a high fever  ? Hydrochlorothiazide   ?  hyponatremia  ? Penicillins Hives and Swelling  ?  Swelling of arms & face ?Has patient had a PCN reaction causing immediate rash, facial/tongue/throat swelling, SOB or lightheadedness with hypotension: Yes ?Has patient had a PCN reaction causing severe rash involving mucus membranes or skin necrosis: No ?Has patient had a PCN reaction that required hospitalization: No ?Has patient had a PCN reaction occurring within the last 10 years: No ?If all of the above answers are "NO", then may proceed with Cephalosporin use. ?  ? Phenobarbital Hives  ? Statins   ?  Muscle pain  ? Passion Fruit Flavor Diarrhea and Rash  ? Tape Rash  ?  MEDICAL TAPE  ? ? ?Current Outpatient Medications on File Prior to Visit  ?Medication Sig Dispense Refill  ? acyclovir (ZOVIRAX) 400 MG tablet Take 400 mg by mouth 5 (five) times daily as needed (fever blisters).    ? albuterol (PROAIR HFA) 108 (90 Base) MCG/ACT inhaler Inhale 2 puffs into the lungs every 6 (six) hours as needed for wheezing. 3 Inhaler 3  ? albuterol (PROVENTIL) (2.5 MG/3ML) 0.083% nebulizer solution  Take 3 mLs (2.5 mg total) by nebulization every 6 (six) hours as needed. 360 mL 3  ? alendronate (FOSAMAX) 70 MG tablet TAKE 1 TABLET EVERY 7 (SEVEN) DAYS. TAKE WITH A FULL GLASS OF WATER ON AN EMPTY STOMACH.

## 2022-02-02 ENCOUNTER — Other Ambulatory Visit: Payer: Self-pay | Admitting: Family Medicine

## 2022-02-05 DIAGNOSIS — I350 Nonrheumatic aortic (valve) stenosis: Secondary | ICD-10-CM | POA: Diagnosis not present

## 2022-02-05 DIAGNOSIS — E7800 Pure hypercholesterolemia, unspecified: Secondary | ICD-10-CM | POA: Diagnosis not present

## 2022-02-05 DIAGNOSIS — I10 Essential (primary) hypertension: Secondary | ICD-10-CM | POA: Diagnosis not present

## 2022-02-05 DIAGNOSIS — R002 Palpitations: Secondary | ICD-10-CM | POA: Diagnosis not present

## 2022-02-08 ENCOUNTER — Ambulatory Visit: Payer: Self-pay | Admitting: *Deleted

## 2022-02-08 ENCOUNTER — Emergency Department (HOSPITAL_BASED_OUTPATIENT_CLINIC_OR_DEPARTMENT_OTHER): Admit: 2022-02-08 | Discharge: 2022-02-08 | Disposition: A | Discharge status: Home / Self Care | Payer: PPO

## 2022-02-08 ENCOUNTER — Emergency Department (HOSPITAL_COMMUNITY)
Admission: EM | Admit: 2022-02-08 | Discharge: 2022-02-08 | Disposition: A | Discharge status: Home / Self Care | Payer: PPO | Attending: Emergency Medicine | Admitting: Emergency Medicine

## 2022-02-08 ENCOUNTER — Encounter (HOSPITAL_COMMUNITY): Payer: Self-pay | Admitting: Emergency Medicine

## 2022-02-08 DIAGNOSIS — I2510 Atherosclerotic heart disease of native coronary artery without angina pectoris: Secondary | ICD-10-CM | POA: Diagnosis not present

## 2022-02-08 DIAGNOSIS — M79604 Pain in right leg: Secondary | ICD-10-CM | POA: Diagnosis not present

## 2022-02-08 DIAGNOSIS — M79661 Pain in right lower leg: Secondary | ICD-10-CM | POA: Insufficient documentation

## 2022-02-08 DIAGNOSIS — Z79899 Other long term (current) drug therapy: Secondary | ICD-10-CM | POA: Insufficient documentation

## 2022-02-08 DIAGNOSIS — J45909 Unspecified asthma, uncomplicated: Secondary | ICD-10-CM | POA: Diagnosis not present

## 2022-02-08 DIAGNOSIS — I10 Essential (primary) hypertension: Secondary | ICD-10-CM | POA: Diagnosis not present

## 2022-02-08 DIAGNOSIS — H9202 Otalgia, left ear: Secondary | ICD-10-CM | POA: Diagnosis not present

## 2022-02-08 MED ORDER — METHYLPREDNISOLONE 4 MG PO TBPK: ORAL_TABLET | ORAL | 0 refills | Status: DC

## 2022-02-08 NOTE — ED Provider Notes (Signed)
?[facility] DEPT ?Provider Note ? ? ?CSN: [id number] ?Arrival date & time: 02/08/22  1523 ? ?  ? ?History ? ?Chief Complaint  ?Patient presents with  ? Leg Pain  ? ? ?Brianna Mccarty is a 81 y.o. female with medical history of allergic rhinitis, asthma, bronchitis, coronary artery disease, diverticulosis, kidney stones, hypertension, migraines, osteoporosis.  Patient presents ED for evaluation of right leg pain.  Patient states that 2 weeks ago she suffered an injury to her right leg when she attempted to open a door on a hill causing the door to swing back into her leg.  Patient states since the injury the leg has been constantly sore and tingling and she is not concerned about blood clots.  Patient goes on to state that she "has multiple family members who have had blood clots and she just wanted to get checked out".  Patient continues that now she "has knots where her previous injury was".  Patient denies any calf tenderness.  Patient also states that she is on prednisone taper since 3/30 due to asthma and allergies.  Patient states that this for years had fluid in them and since being on steroid taper the fluid has receded.  Patient states that her taper ended 2 days ago and since then she has noticed increased trouble hearing and feels as if the fluid has returned.  Patient is requesting medication refill for steroids.  Patient continues that apparently her daughter is in crisis, she has significant alcohol use and feels that she does not need help.  This is because much stress for the patient.  Patient also adds that her nephew was airlifted to [facility] yesterday after being involved in MVC.  Patient endorsing right leg pain, trouble hearing out of left ear.  Patient denies any chest pain, shortness of breath, fevers, headache, blurred vision, nausea, vomiting, diarrhea, abdominal pain, sore throat, leg swelling, ear discharge. ? ? ?Leg Pain ?Associated symptoms: no  fever   ? ?  ? ?Home Medications ?Prior to Admission medications   ?Medication Sig Start Date End Date Taking? Authorizing Provider  ?methylPREDNISolone (MEDROL DOSEPAK) 4 MG TBPK tablet Follow instructions on package 02/08/22  Yes Azucena Cecil, PA-C  ?acyclovir (ZOVIRAX) 400 MG tablet Take 400 mg by mouth 5 (five) times daily as needed (fever blisters).    [provider]  ?albuterol (PROAIR HFA) 108 (90 Base) MCG/ACT inhaler Inhale 2 puffs into the lungs every 6 (six) hours as needed for wheezing. 09/10/18   Noralee Space, MD  ?albuterol (PROVENTIL) (2.5 MG/3ML) 0.083% nebulizer solution Take 3 mLs (2.5 mg total) by nebulization every 6 (six) hours as needed. 09/10/18 07/09/29  Noralee Space, MD  ?alendronate (FOSAMAX) 70 MG tablet TAKE 1 TABLET EVERY 7 (SEVEN) DAYS. TAKE WITH A FULL GLASS OF WATER ON AN EMPTY STOMACH. 05/06/21   Micheline Rough C, MD  ?amLODipine (NORVASC) 10 MG tablet TAKE 1 TABLET BY MOUTH EVERY DAY 01/16/22   Caren Macadam, MD  ?azelastine (ASTELIN) 0.1 % nasal spray Instill 2 sprays into each nostril two times a day as needed for allergies 02/03/21   Scot Jun, FNP  ?Bempedoic Acid (NEXLETOL) 180 MG TABS Take 1 tablet by mouth daily. 10/05/21   Freada Bergeron, MD  ?betamethasone dipropionate 0.05 % cream Apply 1 application topically 2 (two) times daily as needed (scaly skin). 03/29/21   [provider]  ?budesonide-formoterol (SYMBICORT) 160-4.5 MCG/ACT inhaler Inhale 2 puffs into  the lungs 2 (two) times daily. 10/05/21   Caren Macadam, MD  ?Cholecalciferol 125 MCG (5000 UT) TABS Take 1 tablet by mouth daily.    [provider]  ?desmopressin (DDAVP) 0.2 MG tablet Take 0.2 mg by mouth at bedtime.    [provider]  ?EPINEPHrine 0.3 mg/0.3 mL IJ SOAJ injection Inject 0.3 mg into the muscle as needed for anaphylaxis. 12/21/18   [provider]  ?fexofenadine (ALLEGRA) 180 MG tablet Take 180 mg by mouth daily.    [provider]  ?furosemide (LASIX) 20 MG tablet Take 1 tablet (20 mg total) by mouth daily as needed for edema. 12/13/21   Freada Bergeron, MD  ?hydrALAZINE (APRESOLINE) 50 MG tablet TAKE 1 TAB 3 TIMES A DAY AND TAKE EXTRA TAB IF SBP>160 ?Patient taking differently: TAKE 1 TAB 2 TIMES A DAY AND TAKE EXTRA TAB IF SBP>160 07/16/21   Freada Bergeron, MD  ?hydrOXYzine (ATARAX/VISTARIL) 10 MG tablet Take 10 mg by mouth at bedtime.     [provider]  ?losartan (COZAAR) 100 MG tablet TAKE 1 TABLET BY MOUTH EVERY DAY 01/16/22   Koberlein, Steele Berg, MD  ?METAMUCIL FIBER PO Take 1 capsule by mouth once a week.    [provider]  ?montelukast (SINGULAIR) 10 MG tablet Take 10 mg by mouth at bedtime.    [provider]  ?omeprazole (PRILOSEC) 40 MG capsule TAKE 1 CAPSULE BY MOUTH EVERY DAY 07/13/21   Caren Macadam, MD  ?pentosan polysulfate (ELMIRON) 100 MG capsule Take 100 mg by mouth daily.    [provider]  ?polyethylene glycol (MIRALAX / GLYCOLAX) 17 g packet Take 17 g by mouth daily as needed for moderate constipation.    [provider]  ?prazosin (MINIPRESS) 5 MG capsule *NEED OFFICE VISIT* TAKE 1 CAPSULE BY MOUTH TWICE A DAY 02/02/22   Koberlein, Steele Berg, MD  ?predniSONE (DELTASONE) 10 MG tablet 40 mg x 3 days, 20 mg x 3 days, 10 mg x 3 days 01/31/22   Dorothyann Peng, NP  ?Probiotic Product (PROBIOTIC PO) Take 1 capsule by mouth daily.     [provider]  ?Sodium Chloride, Hypertonic, (MURO 128 OP) Place 1 drop into both eyes daily as needed (allergies).    [provider]  ?   ? ?Allergies    ?Celebrex [celecoxib], Cephalexin, Hydrochlorothiazide, Penicillins, Phenobarbital, Statins, Passion fruit flavor, and Tape   ? ?Review of Systems   ?Review of Systems  ?Constitutional:  Negative for fever.  ?HENT:  Positive for ear pain. Negative for ear discharge and sore throat.   ?Eyes:  Negative for visual disturbance.  ?Respiratory:  Negative for  shortness of breath.   ?Cardiovascular:  Negative for chest pain and leg swelling.  ?Gastrointestinal:  Negative for abdominal pain, diarrhea, nausea and vomiting.  ?Musculoskeletal:  Positive for arthralgias.  ?Neurological:  Negative for headaches.  ?All other systems reviewed and are negative. ? ?Physical Exam ?Updated Vital Signs ?BP (!) 160/69 (BP Location: Right Arm)   Pulse 75   Temp (!) 97.5 ?F (36.4 ?C) (Oral)   Resp 17   SpO2 98%  ?Physical Exam ?Vitals and nursing note reviewed.  ?Constitutional:   ?   General: She is not in acute distress. ?   Appearance: Normal appearance. She is normal weight. She is not ill-appearing, toxic-appearing or diaphoretic.  ?HENT:  ?   Head: Normocephalic and atraumatic.  ?   Right Ear: Tympanic membrane, ear canal  and external ear normal.  ?   Left Ear: Tenderness present. No drainage. A middle ear effusion is present. Tympanic membrane is not perforated or erythematous.  ?   Nose: Nose normal. No congestion.  ?   Mouth/Throat:  ?   Mouth: Mucous membranes are moist.  ?   Pharynx: Oropharynx is clear. No oropharyngeal exudate or posterior oropharyngeal erythema.  ?Eyes:  ?   Extraocular Movements: Extraocular movements intact.  ?   Conjunctiva/sclera: Conjunctivae normal.  ?   Pupils: Pupils are equal, round, and reactive to light.  ?Cardiovascular:  ?   Rate and Rhythm: Normal rate and regular rhythm.  ?Pulmonary:  ?   Effort: Pulmonary effort is normal.  ?   Breath sounds: Normal breath sounds. No wheezing.  ?Abdominal:  ?   General: Abdomen is flat. Bowel sounds are normal.  ?   Palpations: Abdomen is soft.  ?   Tenderness: There is no abdominal tenderness.  ?Musculoskeletal:  ?   Cervical back: Normal range of motion and neck supple. No tenderness.  ?Skin: ?   General: Skin is warm and dry.  ?   Capillary Refill: Capillary refill takes less than 2 seconds.  ?Neurological:  ?   Mental Status: She is alert and oriented to person, place, and time.  ? ? ?ED Results /  Procedures / Treatments   ?Labs ?(all labs ordered are listed, but only abnormal results are displayed) ?Labs Reviewed - No data to display ? ?EKG ?None ? ?Radiology ?VAS [place] LOWER EXTREMITY VENOUS (DVT) (7a-7p) ?

## 2022-02-08 NOTE — ED Triage Notes (Signed)
Patient c/o R leg pain after hitting it on car door x2 weeks ago. Also reports taking prednisone for fluid in ears and states since finishing prednisone symptoms have returned. ?

## 2022-02-08 NOTE — Telephone Encounter (Signed)
?  Chief Complaint: knot on side of calf ?Symptoms: injured two weeks ago now a knot, she is concerned about a blood clot. ?Frequency: constant for two weeks ?Pertinent Negatives: Patient denies fever, sob ?Disposition: '[x]'$ ED /'[]'$ Urgent Care (no appt availability in office) / '[]'$ Appointment(In office/virtual)/ '[]'$  [facility]/ '[]'$ Home Care/ '[]'$ Refused Recommended Disposition /'[]'$ [facility]/ '[]'$  Follow-up with PCP ?Additional Notes: Pt has appt with PCP [facility] but not until next week. She is concerned it is a blood clot. Pt will be going to [facility] ED. I did ask her to have a driver but pt insists on driving herself. ? ?Reason for Disposition ? Chest pain ? ?Answer Assessment - Initial Assessment Questions ?1. ONSET: "When did the pain start?"  ?    After hitting leg two weeks ago ?2. LOCATION: "Where is the pain located?"  ?    On side of calf ?3. PAIN: "How bad is the pain?"    (Scale 1-10; or mild, moderate, severe) ?  -  MILD (1-3): doesn't interfere with normal activities  ?  -  MODERATE (4-7): interferes with normal activities (e.g., work or school) or awakens from sleep, limping  ?  -  SEVERE (8-10): excruciating pain, unable to do any normal activities, unable to walk ?    Just sore unless rubs it ?4. WORK OR EXERCISE: "Has there been any recent work or exercise that involved this part of the body?"  ?    Hit leg on car door two weeks ago but now a knot ?5. CAUSE: "What do you think is causing the leg pain?" ?    Hit on car door. ?6. OTHER SYMPTOMS: "Do you have any other symptoms?" (e.g., chest pain, back pain, breathing difficulty, swelling, rash, fever, numbness, weakness) ?    no ?7. PREGNANCY: "Is there any chance you are pregnant?" "When was your last menstrual period?" ?    Na ? ?Protocols used: Leg Pain-A-AH ? ?

## 2022-02-08 NOTE — Progress Notes (Signed)
Right lower extremity venous duplex has been completed. ?Preliminary results can be found in CV Proc through chart review.  ?Results were given to Genevive Bi PA. ? ?02/08/22 4:15 PM ?Brianna Mccarty RVT   ?

## 2022-02-08 NOTE — Discharge Instructions (Signed)
Please return to the ED with any new symptoms ?Please follow-up with your PCP for any ongoing needs ?I have sent in a Medrol Dosepak for you.  Please follow the instructions on the packet. ?

## 2022-02-11 ENCOUNTER — Ambulatory Visit (INDEPENDENT_AMBULATORY_CARE_PROVIDER_SITE_OTHER): Payer: PPO | Admitting: Family Medicine

## 2022-02-11 ENCOUNTER — Encounter: Payer: Self-pay | Admitting: Family Medicine

## 2022-02-11 ENCOUNTER — Telehealth: Payer: Self-pay | Admitting: Cardiology

## 2022-02-11 VITALS — BP 122/80 | HR 62 | Temp 98.1°F | Ht 61.0 in | Wt 168.2 lb

## 2022-02-11 DIAGNOSIS — J069 Acute upper respiratory infection, unspecified: Secondary | ICD-10-CM

## 2022-02-11 DIAGNOSIS — H65193 Other acute nonsuppurative otitis media, bilateral: Secondary | ICD-10-CM | POA: Diagnosis not present

## 2022-02-11 DIAGNOSIS — S8011XD Contusion of right lower leg, subsequent encounter: Secondary | ICD-10-CM | POA: Diagnosis not present

## 2022-02-11 NOTE — Progress Notes (Signed)
? ?  Subjective:  ? ? Patient ID: Brianna Mccarty, female    DOB: 1941-06-13, 81 y.o.   MRN: [id number] ? ?HPI ?Here to follow up an ER visit on 02-08-22 along with several issues. Two weeks prior to the ER visit she had been struck on the right lower leg by a door that had closed on her. She had swelling in the area and she was worried about a potential blood clot. At the ER an US of the leg was negative for any DVT's. Also in the ER she was noted to have some fluid in the ears, and she was started on a Medrol dose pack. Today she asks [place] to recheck her leg and eras. Also this morning she developed a dry cough that she asks [place] to check. There is no SOB or chest pain or fever.  ? ? ?Review of Systems  ?Constitutional: Negative.   ?HENT:  Positive for congestion. Negative for ear pain, postnasal drip, sinus pressure and sore throat.   ?Eyes: Negative.   ?Respiratory:  Positive for cough. Negative for shortness of breath and wheezing.   ?Cardiovascular:  Positive for leg swelling. Negative for chest pain and palpitations.  ? ?   ?Objective:  ? Physical Exam ?Constitutional:   ?   Appearance: Normal appearance. She is not ill-appearing.  ?HENT:  ?   Right Ear: Tympanic membrane, ear canal and external ear normal.  ?   Left Ear: Tympanic membrane, ear canal and external ear normal.  ?   Nose: Nose normal.  ?   Mouth/Throat:  ?   Pharynx: Oropharynx is clear.  ?Eyes:  ?   Conjunctiva/sclera: Conjunctivae normal.  ?Cardiovascular:  ?   Rate and Rhythm: Normal rate and regular rhythm.  ?   Pulses: Normal pulses.  ?   Heart sounds: Normal heart sounds.  ?Pulmonary:  ?   Effort: Pulmonary effort is normal.  ?   Breath sounds: Normal breath sounds.  ?Musculoskeletal:  ?   Comments: The lateral right lower leg has a small area of tenderness and a small hematoma is palpable under the skin. No ecchymosis.  ?Lymphadenopathy:  ?   Cervical: No cervical adenopathy.  ?Neurological:  ?   Mental Status: She is alert.  ? ? ? ? ? ?    ?Assessment & Plan:  ?She has a small hematoma on the leg from the bruise, and I reassured her this will resolve over the next 4-6 weeks. Her ears are clear today so the effusions seem to have resolved. She also has a viral URI today. She can drink fluids and take Robitussin as needed. We spent a total of ( 34  ) minutes reviewing records and discussing these issues.  ?Alysia Penna, MD ? ? ? ?

## 2022-02-11 NOTE — Telephone Encounter (Signed)
Called pt informed that MD has not resulted heart monitor yet.  Advised that our office will call once resulted.  ?

## 2022-02-11 NOTE — Telephone Encounter (Signed)
Patient is requesting a call back to discuss monitor results. 

## 2022-02-12 ENCOUNTER — Ambulatory Visit: Payer: PPO | Admitting: Nurse Practitioner

## 2022-02-14 DIAGNOSIS — J3089 Other allergic rhinitis: Secondary | ICD-10-CM | POA: Diagnosis not present

## 2022-02-14 DIAGNOSIS — J301 Allergic rhinitis due to pollen: Secondary | ICD-10-CM | POA: Diagnosis not present

## 2022-02-15 ENCOUNTER — Telehealth: Payer: Self-pay

## 2022-02-15 ENCOUNTER — Encounter: Payer: Self-pay | Admitting: Physician Assistant

## 2022-02-15 DIAGNOSIS — I471 Supraventricular tachycardia, unspecified: Secondary | ICD-10-CM

## 2022-02-15 HISTORY — DX: Supraventricular tachycardia: I47.1

## 2022-02-15 HISTORY — DX: Supraventricular tachycardia, unspecified: I47.10

## 2022-02-15 MED ORDER — METOPROLOL SUCCINATE ER 25 MG PO TB24: 25.0000 mg | ORAL_TABLET | Freq: Every day | ORAL | 3 refills | Status: DC

## 2022-02-15 NOTE — Telephone Encounter (Signed)
The patient has been notified of the result and verbalized understanding.  All questions (if any) were answered. ?Antonieta Iba, RN 02/15/2022 1:57 PM  ?Rx has been sent in.  ?

## 2022-02-15 NOTE — Telephone Encounter (Signed)
-----   Message from Liliane Shi, [place] sent at 02/15/2022  1:43 PM EDT ----- ?Monitor shows mostly normal sinus rhythm.  There was no atrial fibrillation.  She did have several runs of Supraventricular Tachycardia.  The longest was 3 minutes.  This likely explains her palpitations.  I think we should try her on a low dose of beta-blocker to see if we can control her palpitations.  ?PLAN:  ?-Start Toprol XL 25 mg once daily ?-If palpitations continue, call for earlier f/u. ?-Send copy to PCP ?Richardson Dopp, PA-C    ?02/15/2022 1:39 PM   ? ?

## 2022-02-21 ENCOUNTER — Ambulatory Visit (INDEPENDENT_AMBULATORY_CARE_PROVIDER_SITE_OTHER): Payer: PPO | Admitting: Nurse Practitioner

## 2022-02-21 ENCOUNTER — Encounter: Payer: Self-pay | Admitting: Nurse Practitioner

## 2022-02-21 VITALS — BP 120/70 | Ht 62.0 in | Wt 168.0 lb

## 2022-02-21 DIAGNOSIS — M810 Age-related osteoporosis without current pathological fracture: Secondary | ICD-10-CM | POA: Diagnosis not present

## 2022-02-21 DIAGNOSIS — Z01419 Encounter for gynecological examination (general) (routine) without abnormal findings: Secondary | ICD-10-CM

## 2022-02-21 DIAGNOSIS — L900 Lichen sclerosus et atrophicus: Secondary | ICD-10-CM

## 2022-02-21 DIAGNOSIS — Z780 Asymptomatic menopausal state: Secondary | ICD-10-CM

## 2022-02-21 DIAGNOSIS — Z9189 Other specified personal risk factors, not elsewhere classified: Secondary | ICD-10-CM

## 2022-02-21 DIAGNOSIS — B009 Herpesviral infection, unspecified: Secondary | ICD-10-CM

## 2022-02-21 MED ORDER — ACYCLOVIR 400 MG PO TABS: 400.0000 mg | ORAL_TABLET | Freq: Every day | ORAL | 2 refills | Status: DC | PRN

## 2022-02-21 NOTE — Progress Notes (Signed)
?Brianna Mccarty 1940/11/23 [id number] ? ? ?History:  81 y.o. [id number] presents for breast and pelvic exam without GYN complaints. Postmenopausal - no HRT, no bleeding. Cryosurgery for abnormal pap >40 years ago, subsequent paps normal. Lichen Sclerosus managed well with Betamethasone ointment as needed. Normal mammogram history. Osteoporosis managed by PCP, was restarted of Fosamax July 2021. 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  ? ?Gynecologic History ?No LMP recorded. Patient is postmenopausal. ?  ?Contraception: post menopausal status ?Sexually active: No ? ?Health maintenance ?Last Pap: 07/03/2015. Results were: Normal ?Last mammogram: 02/14/2021. Results were: Normal ?Last colonoscopy: 02/07/2015. Results were: Normal ?Last Dexa: 05/17/2020. Results were: T-score -2.0 ? ?Past medical history, past surgical history, family history and social history were all reviewed and documented in the EPIC chart. Married. 2 children. Involved in her church, helping daughter who is having back surgery soon. 2 great grandchildren ages 17 and 20.  ? ?ROS:  A ROS was performed and pertinent positives and negatives are included. ? ?Exam: ? ?Vitals:  ? 02/21/22 1025  ?BP: 120/70  ?Weight: 168 lb (76.2 kg)  ?Height: '5\' 2"'$  (1.575 m)  ? ? ?Body mass index is 30.73 kg/m?. ? ?General appearance:  Normal ?Thyroid:  Symmetrical, normal in size, without palpable masses or nodularity. ?Respiratory ? Auscultation:  Clear without wheezing or rhonchi ?Cardiovascular ? Auscultation:  Regular rate, without rubs, murmurs or gallops ? Edema/varicosities:  Not grossly evident ?Abdominal ? Soft,nontender, without masses, guarding or rebound. ? Liver/spleen:  No organomegaly noted ? Hernia:  None appreciated ? Skin ? Inspection:  Grossly normal ?  ?Breasts: Examined lying and sitting.  ? Right: Without masses, retractions, discharge or axillary adenopathy. ? ? Left: Without masses, retractions, discharge or axillary  adenopathy. ?Gentitourinary  ? Inguinal/mons:  Normal without inguinal adenopathy ? External genitalia:  Normal atrophic appearing vulva ? BUS/Urethra/Skene's glands:  Normal ? Vagina:  Atrophic changes ? Cervix:  Normal ? Uterus:  Normal in size, shape and contour.  Midline and mobile ? Adnexa/parametria:   ?  Rt: Without masses or tenderness. ?  Lt: Without masses or tenderness. ? Anus and perineum: Hypopigmentation with wrinkled appearance c/w LS ? Digital rectal exam: Normal sphincter tone without palpated masses or tenderness ? ?Patient informed chaperone available to be present for breast and pelvic exam. Patient has requested no chaperone to be present. Patient has been advised what will be completed during breast and pelvic exam.  ? ?Assessment/Plan:  81 y.o. [id number] for breast and pelvic exam.  ? ?Well female exam with routine gynecological exam - Education provided on SBEs, importance of preventative screenings, current guidelines, high calcium diet, regular exercise, and multivitamin daily. Labs with PCP.  ? ?Postmenopausal - no HRT, no bleeding ? ?Age-related osteoporosis without current pathological fracture - PCP manages. Restarted on Fosamax July 2021. Most recent T-score -2.0 in July 2021. ? ?HSV-1 infection - Plan: acyclovir (ZOVIRAX) 400 MG tablet 5x per day as needed for outbreaks. Has 2-3 per year. Oral only.  ? ?Lichen sclerosus - managed well with Betamethasone ointment as needed. No refill needed today.  ? ?Screening for cervical cancer - Cryosurgery >40 years ago, subsequent paps normal. No longer screening per guidelines.  ? ?Screening for breast cancer - Normal mammogram history.  Continue annual screenings.  Normal breast exam today. ? ?Screening for colon cancer - 2016 colonoscopy. Screenings no longer recommended d/t age per GI. ? ?Return in 1 year for medication management.  ? ? ? ? ?  [name] DNP, 11:03 AM 02/21/2022 ? ?

## 2022-02-26 ENCOUNTER — Ambulatory Visit (INDEPENDENT_AMBULATORY_CARE_PROVIDER_SITE_OTHER): Payer: PPO | Admitting: Family Medicine

## 2022-02-26 ENCOUNTER — Encounter: Payer: Self-pay | Admitting: Family Medicine

## 2022-02-26 ENCOUNTER — Telehealth: Payer: Self-pay | Admitting: Cardiology

## 2022-02-26 VITALS — BP 120/76 | HR 62 | Temp 100.4°F | Wt 165.0 lb

## 2022-02-26 DIAGNOSIS — A491 Streptococcal infection, unspecified site: Secondary | ICD-10-CM | POA: Diagnosis not present

## 2022-02-26 DIAGNOSIS — R509 Fever, unspecified: Secondary | ICD-10-CM | POA: Diagnosis not present

## 2022-02-26 LAB — POCT RAPID STREP A (OFFICE): Rapid Strep A Screen: POSITIVE — AB

## 2022-02-26 LAB — POC COVID19 BINAXNOW: SARS Coronavirus 2 Ag: NEGATIVE

## 2022-02-26 LAB — POCT INFLUENZA A/B
Influenza A, POC: NEGATIVE
Influenza B, POC: NEGATIVE

## 2022-02-26 MED ORDER — AZITHROMYCIN 250 MG PO TABS: ORAL_TABLET | ORAL | 0 refills | Status: DC

## 2022-02-26 NOTE — Telephone Encounter (Signed)
Spoke with the pt and endorsed recommendations per Richardson Dopp PA-C.  ?Pt states she will start out taking 1/2 tablet of her Toprol XL and work her way to the full dose of 25 mg po daily, if tolerable.  She will keep an eye out on her pressures and for any symptoms.  ?She is aware if this doesn't work, Event organiser endorsed we can always decrease her losartan thereafter.  She will touch base with [place] as needed.  ?Pt verbalized understanding and agrees with this plan.  Pt was more than gracious for all the assistance provided. ?

## 2022-02-26 NOTE — Telephone Encounter (Signed)
Toprol XL dose is fairly low.  It should not drop her BP much.  She can start with 1/2 tablet (12.5 mg once daily).  If her BP runs lower, she can decrease her Losartan to 50 mg once daily. ?Richardson Dopp, PA-C    ?02/26/2022 11:06 AM   ?

## 2022-02-26 NOTE — Telephone Encounter (Signed)
? ?  Pt c/o medication issue: ? ?1. Name of Medication: metoprolol succinate (TOPROL XL) 25 MG 24 hr tablet ? ?2. How are you currently taking this medication (dosage and times per day)? Take 1 tablet (25 mg total) by mouth daily. ? ?3. Are you having a reaction (difficulty breathing--STAT)?  ? ?4. What is your medication issue? Pt said, this was prescribed by Richardson Dopp, when she read the side effects it says it will lower her BP and her diastolic is already on the low 50s. She wanted to check in with Dr. Johney Frame if she needs to be taking this meds  ?

## 2022-02-26 NOTE — Telephone Encounter (Signed)
Will forward this information to Dr. Johney Frame and Richardson Dopp PA-C, for further review and advisement.  ?We will follow-up with the pt accordingly thereafter.  ?

## 2022-02-26 NOTE — Progress Notes (Signed)
? ?  Subjective:  ? ? Patient ID: Brianna Mccarty, female    DOB: [birth year], 81 y.o.   MRN: [id number] ? ?HPI ?Here for a cough and a fever that started last night. She has been seen twice in our clinic and once in the ER over the past month for allergy-related symptoms, and she responded well to a couple of Prednisone tapers. However this time she feels very different. She is coughing up yellow sputum and is mildly SOB. Using her inhalers and nebulizer. She has tested negative here today for Covid and flu, and she is positive for Strep.  ? ? ?Review of Systems  ?Constitutional:  Positive for fever.  ?HENT:  Positive for congestion, postnasal drip and sore throat.   ?Eyes: Negative.   ?Respiratory:  Positive for cough and shortness of breath.   ?Cardiovascular: Negative.   ?Gastrointestinal: Negative.   ? ?   ?Objective:  ? Physical Exam ?Constitutional:   ?   Comments: Coughing frequently   ?HENT:  ?   Right Ear: Tympanic membrane, ear canal and external ear normal.  ?   Left Ear: Tympanic membrane, ear canal and external ear normal.  ?   Nose: Nose normal.  ?   Mouth/Throat:  ?   Pharynx: Oropharynx is clear.  ?Eyes:  ?   Conjunctiva/sclera: Conjunctivae normal.  ?   Pupils: Pupils are equal, round, and reactive to light.  ?Cardiovascular:  ?   Rate and Rhythm: Normal rate and regular rhythm.  ?   Pulses: Normal pulses.  ?   Heart sounds: Normal heart sounds.  ?Pulmonary:  ?   Effort: Pulmonary effort is normal.  ?   Breath sounds: Normal breath sounds.  ?Lymphadenopathy:  ?   Cervical: No cervical adenopathy.  ?Neurological:  ?   Mental Status: She is alert.  ? ? ? ? ? ?   ?Assessment & Plan:  ?Strep infection, treat with a Zpack. Recheck as needed.  ?Alysia Penna, MD ? ? ?

## 2022-02-28 DIAGNOSIS — J449 Chronic obstructive pulmonary disease, unspecified: Secondary | ICD-10-CM | POA: Diagnosis not present

## 2022-03-03 ENCOUNTER — Encounter: Payer: Self-pay | Admitting: Emergency Medicine

## 2022-03-03 ENCOUNTER — Ambulatory Visit
Admission: EM | Admit: 2022-03-03 | Discharge: 2022-03-03 | Disposition: A | Discharge status: Home / Self Care | Payer: PPO | Attending: Urgent Care | Admitting: Urgent Care

## 2022-03-03 ENCOUNTER — Ambulatory Visit (INDEPENDENT_AMBULATORY_CARE_PROVIDER_SITE_OTHER): Payer: PPO

## 2022-03-03 DIAGNOSIS — J45909 Unspecified asthma, uncomplicated: Secondary | ICD-10-CM | POA: Diagnosis not present

## 2022-03-03 DIAGNOSIS — Z8709 Personal history of other diseases of the respiratory system: Secondary | ICD-10-CM | POA: Diagnosis not present

## 2022-03-03 DIAGNOSIS — R0602 Shortness of breath: Secondary | ICD-10-CM | POA: Diagnosis not present

## 2022-03-03 DIAGNOSIS — J0191 Acute recurrent sinusitis, unspecified: Secondary | ICD-10-CM | POA: Diagnosis not present

## 2022-03-03 DIAGNOSIS — J3489 Other specified disorders of nose and nasal sinuses: Secondary | ICD-10-CM

## 2022-03-03 DIAGNOSIS — R059 Cough, unspecified: Secondary | ICD-10-CM | POA: Diagnosis not present

## 2022-03-03 DIAGNOSIS — R051 Acute cough: Secondary | ICD-10-CM | POA: Diagnosis not present

## 2022-03-03 MED ORDER — FLUCONAZOLE 150 MG PO TABS: 150.0000 mg | ORAL_TABLET | ORAL | 0 refills | Status: DC

## 2022-03-03 MED ORDER — DOXYCYCLINE HYCLATE 100 MG PO CAPS: 100.0000 mg | ORAL_CAPSULE | Freq: Two times a day (BID) | ORAL | 0 refills | Status: DC

## 2022-03-03 MED ORDER — BENZONATATE 100 MG PO CAPS
100.0000 mg | ORAL_CAPSULE | Freq: Three times a day (TID) | ORAL | 0 refills | Status: DC | PRN
Start: 2022-03-03 — End: 2022-10-01

## 2022-03-03 MED ORDER — PREDNISONE 20 MG PO TABS: ORAL_TABLET | ORAL | 0 refills | Status: DC

## 2022-03-03 MED ORDER — PROMETHAZINE-DM 6.25-15 MG/5ML PO SYRP: 5.0000 mL | ORAL_SOLUTION | Freq: Every evening | ORAL | 0 refills | Status: DC | PRN

## 2022-03-03 NOTE — ED Triage Notes (Addendum)
Patient c/o history of cough, recently diagnosed with strep, finished antbs yesterday, nasal drainage, SOB, itchy eyes, fatigue. ? ?Also patient would like to know if we could look at her bottom it stay irritated and red.  The creams that her dermatologists have prescribed have not worked.  Would like to know if there's anything eternally she can take. ?

## 2022-03-03 NOTE — ED Provider Notes (Signed)
?[place] ? ? ?MRN: [id number] DOB: [birth year] ? ?Subjective:  ? ?Brianna Mccarty is a 81 y.o. female presenting for 1 week history of persistent and recurrent shortness of breath, sinus drainage, sinus pressure, persistent hacking cough.  Patient was seen last week and treated for strep throat which she tested positive for.  She underwent a course of azithromycin due to her allergies to penicillins and cephalosporins.  She reports that she completed it but still has respiratory symptoms.  She does have a history of allergic rhinitis and asthma.  Uses albuterol inhaler consistently.  Reports that she does pretty well with steroids.  However she has actually undergone 2 rounds of very well at the end of March and then again April 10. ? ?She is also had a 4-week history of persistent irritating and itchy rash over the buttock area.  Has seen her dermatologist about this.  She initially went with a combination steroid antifungal cream which she applied for 2 weeks as prescribed.  Unfortunately this did not resolve her rash and therefore she had a recheck and was prescribed betamethasone ointment.  This also did not make much of a difference.  She has not set up follow-up with her dermatologist. ? ?No current facility-administered medications for this encounter. ? ?Current Outpatient Medications:  ?  acyclovir (ZOVIRAX) 400 MG tablet, Take 1 tablet (400 mg total) by mouth 5 (five) times daily as needed (fever blisters)., Disp: 30 tablet, Rfl: 2 ?  albuterol (PROAIR HFA) 108 (90 Base) MCG/ACT inhaler, Inhale 2 puffs into the lungs every 6 (six) hours as needed for wheezing., Disp: 3 Inhaler, Rfl: 3 ?  albuterol (PROVENTIL) (2.5 MG/3ML) 0.083% nebulizer solution, Take 3 mLs (2.5 mg total) by nebulization every 6 (six) hours as needed., Disp: 360 mL, Rfl: 3 ?  alendronate (FOSAMAX) 70 MG tablet, TAKE 1 TABLET EVERY 7 (SEVEN) DAYS. TAKE WITH A FULL GLASS OF WATER ON AN EMPTY STOMACH., Disp: 12 tablet, Rfl:  3 ?  amLODipine (NORVASC) 10 MG tablet, TAKE 1 TABLET BY MOUTH EVERY DAY, Disp: 90 tablet, Rfl: 0 ?  azelastine (ASTELIN) 0.1 % nasal spray, Instill 2 sprays into each nostril two times a day as needed for allergies, Disp: 30 mL, Rfl: 0 ?  azithromycin (ZITHROMAX Z-PAK) 250 MG tablet, As directed, Disp: 6 each, Rfl: 0 ?  Bempedoic Acid (NEXLETOL) 180 MG TABS, Take 1 tablet by mouth daily., Disp: 90 tablet, Rfl: 1 ?  betamethasone dipropionate 0.05 % cream, Apply 1 application topically 2 (two) times daily as needed (scaly skin)., Disp: , Rfl:  ?  budesonide-formoterol (SYMBICORT) 160-4.5 MCG/ACT inhaler, Inhale 2 puffs into the lungs 2 (two) times daily., Disp: 3 each, Rfl: 3 ?  Cholecalciferol 125 MCG (5000 UT) TABS, Take 1 tablet by mouth daily., Disp: , Rfl:  ?  desmopressin (DDAVP) 0.2 MG tablet, Take 0.2 mg by mouth at bedtime., Disp: , Rfl:  ?  EPINEPHrine 0.3 mg/0.3 mL IJ SOAJ injection, Inject 0.3 mg into the muscle as needed for anaphylaxis., Disp: , Rfl:  ?  fexofenadine (ALLEGRA) 180 MG tablet, Take 180 mg by mouth daily., Disp: , Rfl:  ?  furosemide (LASIX) 20 MG tablet, Take 1 tablet (20 mg total) by mouth daily as needed for edema., Disp: 30 tablet, Rfl: 3 ?  hydrALAZINE (APRESOLINE) 50 MG tablet, TAKE 1 TAB 3 TIMES A DAY AND TAKE EXTRA TAB IF SBP>160 (Patient taking differently: TAKE 1 TAB 2 TIMES A DAY AND TAKE EXTRA TAB  IF SBP>160), Disp: 315 tablet, Rfl: 2 ?  hydrOXYzine (ATARAX/VISTARIL) 10 MG tablet, Take 10 mg by mouth at bedtime. , Disp: , Rfl:  ?  losartan (COZAAR) 100 MG tablet, TAKE 1 TABLET BY MOUTH EVERY DAY, Disp: 90 tablet, Rfl: 0 ?  METAMUCIL FIBER PO, Take 1 capsule by mouth once a week., Disp: , Rfl:  ?  metoprolol succinate (TOPROL XL) 25 MG 24 hr tablet, Take 1 tablet (25 mg total) by mouth daily., Disp: 90 tablet, Rfl: 3 ?  montelukast (SINGULAIR) 10 MG tablet, Take 10 mg by mouth at bedtime., Disp: , Rfl:  ?  omeprazole (PRILOSEC) 40 MG capsule, TAKE 1 CAPSULE BY MOUTH EVERY DAY,  Disp: 90 capsule, Rfl: 3 ?  pentosan polysulfate (ELMIRON) 100 MG capsule, Take 100 mg by mouth daily., Disp: , Rfl:  ?  polyethylene glycol (MIRALAX / GLYCOLAX) 17 g packet, Take 17 g by mouth daily as needed for moderate constipation., Disp: , Rfl:  ?  prazosin (MINIPRESS) 5 MG capsule, *NEED OFFICE VISIT* TAKE 1 CAPSULE BY MOUTH TWICE A DAY, Disp: 180 capsule, Rfl: 1 ?  Probiotic Product (PROBIOTIC PO), Take 1 capsule by mouth daily. , Disp: , Rfl:  ?  Sodium Chloride, Hypertonic, (MURO 128 OP), Place 1 drop into both eyes daily as needed (allergies)., Disp: , Rfl:   ? ?Allergies  ?Allergen Reactions  ? Celebrex [Celecoxib] Diarrhea  ? Cephalexin   ?  Reaction was a high fever  ? Hydrochlorothiazide   ?  hyponatremia  ? Penicillins Hives and Swelling  ?  Swelling of arms & face ?Has patient had a PCN reaction causing immediate rash, facial/tongue/throat swelling, SOB or lightheadedness with hypotension: Yes ?Has patient had a PCN reaction causing severe rash involving mucus membranes or skin necrosis: No ?Has patient had a PCN reaction that required hospitalization: No ?Has patient had a PCN reaction occurring within the last 10 years: No ?If all of the above answers are "NO", then may proceed with Cephalosporin use. ?  ? Phenobarbital Hives  ? Statins   ?  Muscle pain  ? Passion Fruit Flavor Diarrhea and Rash  ? Tape Rash  ?  MEDICAL TAPE  ? ? ?Past Medical History:  ?Diagnosis Date  ? Abnormal EKG   ? Normal LV function in the past  ? Allergic rhinitis   ? 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 Echocardiogram 6/22: 40 mm  ? Asthma   ? Bronchitis, mucopurulent recurrent ([place])   ? Carotid artery disease ([place])   ? a. mild by carotid duplex.  ? Cervical dysplasia 1971  ? Coronary artery disease   ? Diverticulosis of colon   ? DJD (degenerative joint disease)   ? Family history of cancer of extrahepatic bile ducts 09/05/2020  ? Family history of colon cancer 09/05/2020  ? Family history of pancreatic cancer  09/05/2020  ? GERD (gastroesophageal reflux disease)   ? Headache(784.0)   ? History of kidney stones   ? Hypercholesterolemia   ? Hypertension   ? Interstitial cystitis   ? sees urologist  ? Lichen sclerosus   ? Malignant melanoma ([place]) 2006  ? sees Dr. [place] Crane in dermatology  ? Migraines   ? Mild aortic stenosis   ? Mitral valve disease   ? Question mitral valve prolapse in the past, no prolapse by echo 2009  ? Murmur 10/20/2015  ? SEES DR Meda Coffee  ? OSA (obstructive sleep apnea)   ? USES DENTAL DEVICE  ? Osteoporosis   ?  on fosomax > 5 years, stopped 11/2015  ? Pneumonia   ? SVT (supraventricular tachycardia) ([place]) 02/15/2022  ? Monitor 02/2022: NSR, avg HR 61; 2 runs of NSVT (4 beats); several runs of Supraventricular Tachycardia (longest 3"11'); no AFib  ? Thyroid cyst   ? 1 x 1.1 thyroid cyst noted on carotid Doppler, January, 2012  ? Venous insufficiency   ?  ? ?Past Surgical History:  ?Procedure Laterality Date  ? BREAST BIOPSY Left 11/25/2011  ? U/S core, benign performed at [facility], [place]  ? BREAST CYST ASPIRATION    ? BREAST SURGERY  2013  ? Breast Bx-Benign  ? CARDIAC CATHETERIZATION    ? CATARACT EXTRACTION, BILATERAL    ? CHOLECYSTECTOMY N/A 12/22/2019  ? Procedure: LAPAROSCOPIC CHOLECYSTECTOMY;  Surgeon: Ileana Roup, MD;  Location: [facility];  Service: General;  Laterality: N/A;  ? cystoscopy and basket stone removal right ureter  02/2006  ? Dr. Terance Hart  ? GALLBLADDER SURGERY  12/22/2019  ? GYNECOLOGIC CRYOSURGERY  1971  ? left total hip replacement  2004  ? Dr. Gladstone Lighter  ? melanoma removed from medial rleft knee area  2006  ? Dr. Nevada Crane  ? NASAL SEPTUM SURGERY    ? TOTAL HIP ARTHROPLASTY Right 08/01/2021  ? Procedure: TOTAL HIP ARTHROPLASTY ANTERIOR APPROACH;  Surgeon: Gaynelle Arabian, MD;  Location: [facility];  Service: Orthopedics;  Laterality: Right;  ? ? ?Family History  ?Problem Relation Age of Onset  ? Pancreatic cancer Father 50  ? Diabetes Mother   ? Heart disease  Mother   ? Cancer Brother 38  ?     Bile duct  ? Diabetes Brother   ? Hypertension Brother   ? Diabetes Brother   ? Heart disease Brother   ? Hypertension Brother   ? Hypertension Sister   ? Hypertension Sist

## 2022-03-12 DIAGNOSIS — M25511 Pain in right shoulder: Secondary | ICD-10-CM | POA: Diagnosis not present

## 2022-03-16 ENCOUNTER — Other Ambulatory Visit: Payer: Self-pay | Admitting: Family Medicine

## 2022-03-18 ENCOUNTER — Telehealth: Payer: Self-pay | Admitting: Cardiology

## 2022-03-18 DIAGNOSIS — J3089 Other allergic rhinitis: Secondary | ICD-10-CM | POA: Diagnosis not present

## 2022-03-18 DIAGNOSIS — J301 Allergic rhinitis due to pollen: Secondary | ICD-10-CM | POA: Diagnosis not present

## 2022-03-18 NOTE — Telephone Encounter (Signed)
Pt aware that per Dr. Johney Frame, okay for her to hold and stop metoprolol succinate and continue the rest of her cardiac meds and losartan regimen, and see how she feels. ? ?Advised the pt to continue monitoring her pressures at home and report to [place] as needed, if her numbers start to elevate. ? ?Advised her to make sure she is hydrating well during the day. ? ?Pt verbalized understanding and agrees with this plan. ? ?Toprol XL removed from the pts med list.  ?

## 2022-03-18 NOTE — Telephone Encounter (Signed)
Okay with me to hold the metop and see how she feels.  ?

## 2022-03-18 NOTE — Addendum Note (Signed)
Addended by: Nuala Alpha on: 03/18/2022 03:18 PM ? ? Modules accepted: Orders ? ?

## 2022-03-18 NOTE — Telephone Encounter (Signed)
Left the pt a message to call the office back to endorse recommendations per Dr. Pemberton. 

## 2022-03-18 NOTE — Telephone Encounter (Signed)
Pt c/o medication issue: ? ?1. Name of Medication: metoprolol succinate (TOPROL XL) 25 MG 24 hr tablet ? ?2. How are you currently taking this medication (dosage and times per day)?  ?metoprolol succinate (TOPROL XL) 25 MG 24 hr tablet ?3. Are you having a reaction (difficulty breathing--STAT)? No ? ?4. What is your medication issue?  Pt states that medication is causing her to be tired and weak. Pt would like to know if it can be changed . Please advise ? ?

## 2022-03-18 NOTE — Telephone Encounter (Signed)
Pt is calling to inform Dr. Johney Frame that metoprolol is still causing her to feel fatigued and tired all the time. ? ?She states she is only taking Toprol XL 12.5 mg po daily now, as Richardson Dopp PA-C advised she could do about a month ago. ? ?She states she continuously is feeling very tired from this medication.  She states she is still taking losartan 100 mg po daily. ? ?She reports her pressures and rates have been excellent, with last BP last night at 124/60 and HR-62.  ? ?Pt is inquiring if Dr. Johney Frame would be ok with her stopping the Toprol XL all together, and continuing her losartan 100 mg po daily regimen, and continue to monitor her numbers at home.  She states she will keep [place] in-the-loop if her numbers start creeping up again.  ? ?Pt voiced no other cardiac complaints.  ? ?Informed the pt that I will route this message to Dr. Johney Frame to further review and advise on this.  Pt aware I will follow-up with her accordingly thereafter.  ? ?Pt verbalized understanding and agrees with this plan. ?

## 2022-03-19 ENCOUNTER — Other Ambulatory Visit: Payer: Self-pay | Admitting: General Surgery

## 2022-03-19 DIAGNOSIS — K862 Cyst of pancreas: Secondary | ICD-10-CM

## 2022-03-20 ENCOUNTER — Ambulatory Visit (INDEPENDENT_AMBULATORY_CARE_PROVIDER_SITE_OTHER): Payer: PPO | Admitting: Family Medicine

## 2022-03-20 ENCOUNTER — Encounter: Payer: Self-pay | Admitting: Family Medicine

## 2022-03-20 VITALS — BP 130/80 | HR 60 | Temp 97.9°F | Ht 62.0 in | Wt 168.6 lb

## 2022-03-20 DIAGNOSIS — R5383 Other fatigue: Secondary | ICD-10-CM

## 2022-03-20 DIAGNOSIS — G4733 Obstructive sleep apnea (adult) (pediatric): Secondary | ICD-10-CM | POA: Diagnosis not present

## 2022-03-20 DIAGNOSIS — I10 Essential (primary) hypertension: Secondary | ICD-10-CM

## 2022-03-20 DIAGNOSIS — Z8349 Family history of other endocrine, nutritional and metabolic diseases: Secondary | ICD-10-CM | POA: Diagnosis not present

## 2022-03-20 DIAGNOSIS — R7301 Impaired fasting glucose: Secondary | ICD-10-CM

## 2022-03-20 DIAGNOSIS — R110 Nausea: Secondary | ICD-10-CM | POA: Diagnosis not present

## 2022-03-20 DIAGNOSIS — Z862 Personal history of diseases of the blood and blood-forming organs and certain disorders involving the immune mechanism: Secondary | ICD-10-CM

## 2022-03-20 DIAGNOSIS — J4530 Mild persistent asthma, uncomplicated: Secondary | ICD-10-CM | POA: Diagnosis not present

## 2022-03-20 MED ORDER — AMLODIPINE BESYLATE 10 MG PO TABS: 10.0000 mg | ORAL_TABLET | Freq: Every day | ORAL | 1 refills | Status: DC

## 2022-03-20 MED ORDER — ONDANSETRON HCL 4 MG PO TABS: 4.0000 mg | ORAL_TABLET | Freq: Three times a day (TID) | ORAL | 0 refills | Status: DC | PRN

## 2022-03-20 NOTE — Patient Instructions (Signed)
Apply the betamethazone to elbow area, cover with thick layer of aquaphor, wrap in syran wrap and then dressing nightly x 2 weeks. Let us know if not better in 1 weeks time! ?

## 2022-03-20 NOTE — Progress Notes (Signed)
Brianna Mccarty DOB: 10/29/1941 Encounter date: 03/20/2022  This is a 81 y.o. female who presents with Chief Complaint  Patient presents with   Fatigue    X months   Rash    Patient complains of recurrent rash she feels is eczema bilateral arms and hands x1 week    History of present illness:  Has no energy, just not feeling well. Has had allergies, asthma. Fluid in ears, strep throat, sinus infection. Multiple visits in last few months.   Started on medication from cardiology and this seemed like it worsened fatigue. Told her to stop and monitor blood pressure and heart rate.   Takes care of sister (just monitored in living facility) and daughter with major back surgery 2 weeks ago.   Just no energy even after sleep.   She is also addressing her own back issue. No muscle aches/cramps, but states that she has been nauseated. Wondered about getting some zofran. No abdominal pain. Just queasy sensation. Not all the time. Appetite has been good. Has been eating more sweets than normal.   Occasionally has flares of skin - in elbow creases and on hands. Has been using cream from dermatology - just really dry and irritated skin. Has tried both betamethasone and fluticasone and neither are clearing it. Feels like it dries too much; skin is like leather. Is better than it has been.  Good about using dental device nightly for OSA.   Breathing has been good. Better since asthma has calmed down. Was using neb a lot during spring, but this is better. Sinuses are all better. Went through 2 rounds of abx and 2 rounds of prednisone.   No trouble with urination; bowels normal.   Allergies  Allergen Reactions   Celebrex [Celecoxib] Diarrhea   Cephalexin     Reaction was a high fever   Hydrochlorothiazide     hyponatremia   Penicillins Hives and Swelling    Swelling of arms & face Has patient had a PCN reaction causing immediate rash, facial/tongue/throat swelling, SOB or lightheadedness with  hypotension: Yes Has patient had a PCN reaction causing severe rash involving mucus membranes or skin necrosis: No Has patient had a PCN reaction that required hospitalization: No Has patient had a PCN reaction occurring within the last 10 years: No If all of the above answers are "NO", then may proceed with Cephalosporin use.    Phenobarbital Hives   Statins     Muscle pain   Passion Fruit Flavor Diarrhea and Rash   Tape Rash    MEDICAL TAPE   Current Meds  Medication Sig   acyclovir (ZOVIRAX) 400 MG tablet Take 1 tablet (400 mg total) by mouth 5 (five) times daily as needed (fever blisters).   albuterol (PROAIR HFA) 108 (90 Base) MCG/ACT inhaler Inhale 2 puffs into the lungs every 6 (six) hours as needed for wheezing.   albuterol (PROVENTIL) (2.5 MG/3ML) 0.083% nebulizer solution Take 3 mLs (2.5 mg total) by nebulization every 6 (six) hours as needed.   alendronate (FOSAMAX) 70 MG tablet TAKE 1 TABLET EVERY 7 (SEVEN) DAYS. TAKE WITH A FULL GLASS OF WATER ON AN EMPTY STOMACH.   amLODipine (NORVASC) 10 MG tablet TAKE 1 TABLET BY MOUTH EVERY DAY   azelastine (ASTELIN) 0.1 % nasal spray Instill 2 sprays into each nostril two times a day as needed for allergies   Bempedoic Acid (NEXLETOL) 180 MG TABS Take 1 tablet by mouth daily.   benzonatate (TESSALON) 100 MG capsule Take  1-2 capsules (100-200 mg total) by mouth 3 (three) times daily as needed for cough.   betamethasone dipropionate 0.05 % cream Apply 1 application topically 2 (two) times daily as needed (scaly skin).   budesonide-formoterol (SYMBICORT) 160-4.5 MCG/ACT inhaler Inhale 2 puffs into the lungs 2 (two) times daily.   Cholecalciferol 125 MCG (5000 UT) TABS Take 1 tablet by mouth daily.   desmopressin (DDAVP) 0.2 MG tablet Take 0.2 mg by mouth at bedtime.   EPINEPHrine 0.3 mg/0.3 mL IJ SOAJ injection Inject 0.3 mg into the muscle as needed for anaphylaxis.   fexofenadine (ALLEGRA) 180 MG tablet Take 180 mg by mouth daily.    fluconazole (DIFLUCAN) 150 MG tablet Take 1 tablet (150 mg total) by mouth once a week.   furosemide (LASIX) 20 MG tablet Take 1 tablet (20 mg total) by mouth daily as needed for edema.   hydrALAZINE (APRESOLINE) 50 MG tablet TAKE 1 TAB 3 TIMES A DAY AND TAKE EXTRA TAB IF SBP>160 (Patient taking differently: TAKE 1 TAB 2 TIMES A DAY AND TAKE EXTRA TAB IF SBP>160)   hydrOXYzine (ATARAX/VISTARIL) 10 MG tablet Take 10 mg by mouth at bedtime.    losartan (COZAAR) 100 MG tablet TAKE 1 TABLET BY MOUTH EVERY DAY   METAMUCIL FIBER PO Take 1 capsule by mouth once a week.   montelukast (SINGULAIR) 10 MG tablet Take 10 mg by mouth at bedtime.   omeprazole (PRILOSEC) 40 MG capsule TAKE 1 CAPSULE BY MOUTH EVERY DAY   pentosan polysulfate (ELMIRON) 100 MG capsule Take 100 mg by mouth daily.   polyethylene glycol (MIRALAX / GLYCOLAX) 17 g packet Take 17 g by mouth daily as needed for moderate constipation.   prazosin (MINIPRESS) 5 MG capsule *NEED OFFICE VISIT* TAKE 1 CAPSULE BY MOUTH TWICE A DAY   Probiotic Product (PROBIOTIC PO) Take 1 capsule by mouth daily.    [DISCONTINUED] azithromycin (ZITHROMAX Z-PAK) 250 MG tablet As directed   [DISCONTINUED] doxycycline (VIBRAMYCIN) 100 MG capsule Take 1 capsule (100 mg total) by mouth 2 (two) times daily.   [DISCONTINUED] predniSONE (DELTASONE) 20 MG tablet Take 2 tablets daily with breakfast.   [DISCONTINUED] promethazine-dextromethorphan (PROMETHAZINE-DM) 6.25-15 MG/5ML syrup Take 5 mLs by mouth at bedtime as needed for cough.   [DISCONTINUED] Sodium Chloride, Hypertonic, (MURO 128 OP) Place 1 drop into both eyes daily as needed (allergies).    Review of Systems  Constitutional:  Positive for fatigue. Negative for activity change, appetite change, chills, fever and unexpected weight change.  HENT:  Negative for congestion, ear pain, hearing loss, sinus pressure, sinus pain, sore throat and trouble swallowing.   Eyes:  Negative for pain and visual disturbance.   Respiratory:  Negative for cough, chest tightness, shortness of breath and wheezing.   Cardiovascular:  Negative for chest pain, palpitations and leg swelling.  Gastrointestinal:  Negative for abdominal pain, blood in stool, constipation, diarrhea, nausea and vomiting.  Genitourinary:  Negative for difficulty urinating and menstrual problem.  Musculoskeletal:  Negative for arthralgias and back pain.  Skin:  Positive for rash (elbows bilat).  Neurological:  Negative for dizziness, weakness, numbness and headaches.  Hematological:  Negative for adenopathy. Does not bruise/bleed easily.  Psychiatric/Behavioral:  Negative for sleep disturbance and suicidal ideas. The patient is not nervous/anxious.    Objective:  BP 130/80 (BP Location: Left Arm, Patient Position: Sitting, Cuff Size: Large)   Pulse 60   Temp 97.9 F (36.6 C) (Rectal)   Ht '5\' 2"'$  (1.575 m)   Wt  168 lb 9.6 oz (76.5 kg)   SpO2 99%   BMI 30.84 kg/m   Weight: 168 lb 9.6 oz (76.5 kg)   BP Readings from Last 3 Encounters:  03/20/22 130/80  03/03/22 122/81  02/26/22 120/76   Wt Readings from Last 3 Encounters:  03/20/22 168 lb 9.6 oz (76.5 kg)  03/03/22 164 lb 14.5 oz (74.8 kg)  02/26/22 165 lb (74.8 kg)    Physical Exam Constitutional:      General: She is not in acute distress.    Appearance: She is well-developed.  HENT:     Head: Normocephalic and atraumatic.     Right Ear: External ear normal.     Left Ear: External ear normal.     Mouth/Throat:     Pharynx: No oropharyngeal exudate.  Eyes:     Conjunctiva/sclera: Conjunctivae normal.     Pupils: Pupils are equal, round, and reactive to light.  Neck:     Thyroid: No thyromegaly.  Cardiovascular:     Rate and Rhythm: Normal rate and regular rhythm.     Heart sounds: Normal heart sounds. No murmur heard.   No friction rub. No gallop.  Pulmonary:     Effort: Pulmonary effort is normal.     Breath sounds: Normal breath sounds.  Abdominal:     General:  Bowel sounds are normal. There is no distension.     Palpations: Abdomen is soft. There is no mass.     Tenderness: There is no abdominal tenderness. There is no guarding.     Hernia: No hernia is present.  Musculoskeletal:        General: No tenderness or deformity. Normal range of motion.     Cervical back: Normal range of motion and neck supple.  Lymphadenopathy:     Cervical: No cervical adenopathy.     Upper Body:     Right upper body: No supraclavicular or axillary adenopathy.     Left upper body: No supraclavicular or axillary adenopathy.     Lower Body: No right inguinal adenopathy. No left inguinal adenopathy.  Skin:    General: Skin is warm and dry.     Findings: No rash.  Neurological:     Mental Status: She is alert and oriented to person, place, and time.     Deep Tendon Reflexes: Reflexes normal.     Reflex Scores:      Tricep reflexes are 2+ on the right side and 2+ on the left side.      Bicep reflexes are 2+ on the right side and 2+ on the left side.      Brachioradialis reflexes are 2+ on the right side and 2+ on the left side.      Patellar reflexes are 2+ on the right side and 2+ on the left side. Psychiatric:        Speech: Speech normal.        Behavior: Behavior normal.        Thought Content: Thought content normal.    Assessment/Plan  1. Fatigue, unspecified type Start with bloodwork and consider further eval pending these results. - CBC with Differential/Platelet; Future - Comprehensive metabolic panel; Future - TSH; Future - Urinalysis with Reflex Microscopic; Future - Urinalysis with Reflex Microscopic - TSH - Comprehensive metabolic panel - CBC with Differential/Platelet  2. Essential hypertension Bp has been well controlled. Continue current medication.   3. Nausea Zofran prn. Consider GI follow up if other eval negative and sx continue.  4. Mild persistent asthma without complication Has been doing well s/p bronchitis. Continue with  symbicort.  5. OSA (obstructive sleep apnea) Using dental appliance regularly.   6. Family history of B12 deficiency Recheck bloodwork. She had stopped B12 after being elevated due to over-replacement on prior bloodwork. - Folate; Future - Vitamin B12; Future - Folate; Future - Folate - Vitamin B12 - Folate  7. Impaired fasting glucose - Hemoglobin A1c; Future - Hemoglobin A1c  8. History of anemia - CBC with Differential/Platelet; Future - IBC + Ferritin; Future - IBC + Ferritin - CBC with Differential/Platelet   Return for pending lab or imaging results.      Micheline Rough, MD

## 2022-03-21 ENCOUNTER — Telehealth: Payer: Self-pay | Admitting: Family Medicine

## 2022-03-21 LAB — URINALYSIS, ROUTINE W REFLEX MICROSCOPIC
Bilirubin Urine: NEGATIVE
Hgb urine dipstick: NEGATIVE
Ketones, ur: NEGATIVE
Leukocytes,Ua: NEGATIVE
Nitrite: NEGATIVE
RBC / HPF: NONE SEEN (ref 0–?)
Specific Gravity, Urine: <=1.005 — AB (ref 1.000–1.030)
Total Protein, Urine: NEGATIVE
Urine Glucose: NEGATIVE
Urobilinogen, UA: 0.2 (ref 0.0–1.0)
pH: 6 (ref 5.0–8.0)

## 2022-03-21 LAB — CBC WITH DIFFERENTIAL/PLATELET
Basophils Absolute: 0.1 10*3/uL (ref 0.0–0.1)
Basophils Relative: 1.1 % (ref 0.0–3.0)
Eosinophils Absolute: 0 10*3/uL (ref 0.0–0.7)
Eosinophils Relative: 0.1 % (ref 0.0–5.0)
HCT: 34.9 % — ABNORMAL LOW (ref 36.0–46.0)
Hemoglobin: 11.8 g/dL — ABNORMAL LOW (ref 12.0–15.0)
Lymphocytes Relative: 14.6 % (ref 12.0–46.0)
Lymphs Abs: 1.2 10*3/uL (ref 0.7–4.0)
MCHC: 33.7 g/dL (ref 30.0–36.0)
MCV: 89 fl (ref 78.0–100.0)
Monocytes Absolute: 0.7 10*3/uL (ref 0.1–1.0)
Monocytes Relative: 8.7 % (ref 3.0–12.0)
Neutro Abs: 6.4 10*3/uL (ref 1.4–7.7)
Neutrophils Relative %: 75.5 % (ref 43.0–77.0)
Platelets: 221 10*3/uL (ref 150.0–400.0)
RBC: 3.92 Mil/uL (ref 3.87–5.11)
RDW: 13.7 % (ref 11.5–15.5)
WBC: 8.5 10*3/uL (ref 4.0–10.5)

## 2022-03-21 LAB — COMPREHENSIVE METABOLIC PANEL
ALT: 10 U/L (ref 0–35)
AST: 11 U/L (ref 0–37)
Albumin: 3.9 g/dL (ref 3.5–5.2)
Alkaline Phosphatase: 27 U/L — ABNORMAL LOW (ref 39–117)
BUN: 32 mg/dL — ABNORMAL HIGH (ref 6–23)
CO2: 27 mEq/L (ref 19–32)
Calcium: 9.6 mg/dL (ref 8.4–10.5)
Chloride: 103 mEq/L (ref 96–112)
Creatinine, Ser: 1.17 mg/dL (ref 0.40–1.20)
GFR: 43.51 mL/min — ABNORMAL LOW (ref >60.00)
Glucose, Bld: 93 mg/dL (ref 70–99)
Potassium: 4.6 mEq/L (ref 3.5–5.1)
Sodium: 135 mEq/L (ref 135–145)
Total Bilirubin: 0.4 mg/dL (ref 0.2–1.2)
Total Protein: 6.2 g/dL (ref 6.0–8.3)

## 2022-03-21 LAB — IBC + FERRITIN
Ferritin: 48.7 ng/mL (ref 10.0–291.0)
Iron: 126 ug/dL (ref 42–145)
Saturation Ratios: 29.5 % (ref 20.0–50.0)
TIBC: 427 ug/dL (ref 250.0–450.0)
Transferrin: 305 mg/dL (ref 212.0–360.0)

## 2022-03-21 LAB — VITAMIN B12: Vitamin B-12: 926 pg/mL — ABNORMAL HIGH (ref 211–911)

## 2022-03-21 LAB — TSH: TSH: 1.94 u[IU]/mL (ref 0.35–5.50)

## 2022-03-21 LAB — FOLATE: Folate: >23.5 ng/mL (ref >5.9)

## 2022-03-21 LAB — HEMOGLOBIN A1C: Hgb A1c MFr Bld: 5.9 % (ref 4.6–6.5)

## 2022-03-21 NOTE — Telephone Encounter (Signed)
Pt states ondansetron (ZOFRAN) 4 MG tablet  isn't covered by her insurance and requests a substitute

## 2022-03-22 ENCOUNTER — Other Ambulatory Visit: Payer: Self-pay | Admitting: Family Medicine

## 2022-03-22 MED ORDER — PROMETHAZINE HCL 12.5 MG PO TABS: 12.5000 mg | ORAL_TABLET | Freq: Four times a day (QID) | ORAL | 2 refills | Status: DC | PRN

## 2022-03-22 NOTE — Telephone Encounter (Signed)
Patient informed of the message below.

## 2022-03-22 NOTE — Telephone Encounter (Signed)
I have sent phenergan in. This is cheaper.

## 2022-03-25 DIAGNOSIS — J3089 Other allergic rhinitis: Secondary | ICD-10-CM | POA: Diagnosis not present

## 2022-03-25 DIAGNOSIS — J301 Allergic rhinitis due to pollen: Secondary | ICD-10-CM | POA: Diagnosis not present

## 2022-03-29 ENCOUNTER — Telehealth: Payer: Self-pay | Admitting: Pharmacist

## 2022-03-29 NOTE — Chronic Care Management (AMB) (Signed)
Chronic Care Management Pharmacy Assistant   Name: Brianna Mccarty  MRN: [id number] DOB: 12-Sep-1941  03/29/22 APPOINTMENT REMINDER   Patient was reminded to have all medications, supplements and any blood glucose and blood pressure readings available for review with Jeni Salles, Pharm. D, for telephone visit on 04/02/22 at 10:30.    Care Gaps: Mammogram - Overdue BP- 130/80 ( 03/20/22) AWV- 5/22  Star Rating Drug: Losartan (Cozaar) 100 mg - Last filled 03/25/22 90 DS at [facility]    Medications: Outpatient Encounter Medications as of 03/29/2022  Medication Sig Note   acyclovir (ZOVIRAX) 400 MG tablet Take 1 tablet (400 mg total) by mouth 5 (five) times daily as needed (fever blisters).    albuterol (PROAIR HFA) 108 (90 Base) MCG/ACT inhaler Inhale 2 puffs into the lungs every 6 (six) hours as needed for wheezing.    albuterol (PROVENTIL) (2.5 MG/3ML) 0.083% nebulizer solution Take 3 mLs (2.5 mg total) by nebulization every 6 (six) hours as needed.    alendronate (FOSAMAX) 70 MG tablet TAKE 1 TABLET EVERY 7 (SEVEN) DAYS. TAKE WITH A FULL GLASS OF WATER ON AN EMPTY STOMACH. 07/13/2021: Tuesdays   amLODipine (NORVASC) 10 MG tablet Take 1 tablet (10 mg total) by mouth daily.    azelastine (ASTELIN) 0.1 % nasal spray Instill 2 sprays into each nostril two times a day as needed for allergies    Bempedoic Acid (NEXLETOL) 180 MG TABS Take 1 tablet by mouth daily.    benzonatate (TESSALON) 100 MG capsule Take 1-2 capsules (100-200 mg total) by mouth 3 (three) times daily as needed for cough.    betamethasone dipropionate 0.05 % cream Apply 1 application topically 2 (two) times daily as needed (scaly skin).    budesonide-formoterol (SYMBICORT) 160-4.5 MCG/ACT inhaler Inhale 2 puffs into the lungs 2 (two) times daily.    Cholecalciferol 125 MCG (5000 UT) TABS Take 1 tablet by mouth daily.    desmopressin (DDAVP) 0.2 MG tablet Take 0.2 mg by mouth at bedtime.    EPINEPHrine 0.3 mg/0.3 mL IJ SOAJ  injection Inject 0.3 mg into the muscle as needed for anaphylaxis.    fexofenadine (ALLEGRA) 180 MG tablet Take 180 mg by mouth daily.    fluconazole (DIFLUCAN) 150 MG tablet Take 1 tablet (150 mg total) by mouth once a week.    furosemide (LASIX) 20 MG tablet Take 1 tablet (20 mg total) by mouth daily as needed for edema.    hydrALAZINE (APRESOLINE) 50 MG tablet TAKE 1 TAB 3 TIMES A DAY AND TAKE EXTRA TAB IF SBP>160 (Patient taking differently: TAKE 1 TAB 2 TIMES A DAY AND TAKE EXTRA TAB IF SBP>160)    hydrOXYzine (ATARAX/VISTARIL) 10 MG tablet Take 10 mg by mouth at bedtime.     losartan (COZAAR) 100 MG tablet TAKE 1 TABLET BY MOUTH EVERY DAY    METAMUCIL FIBER PO Take 1 capsule by mouth once a week.    montelukast (SINGULAIR) 10 MG tablet Take 10 mg by mouth at bedtime.    omeprazole (PRILOSEC) 40 MG capsule TAKE 1 CAPSULE BY MOUTH EVERY DAY    pentosan polysulfate (ELMIRON) 100 MG capsule Take 100 mg by mouth daily.    polyethylene glycol (MIRALAX / GLYCOLAX) 17 g packet Take 17 g by mouth daily as needed for moderate constipation.    prazosin (MINIPRESS) 5 MG capsule *NEED OFFICE VISIT* TAKE 1 CAPSULE BY MOUTH TWICE A DAY    Probiotic Product (PROBIOTIC PO) Take 1 capsule by mouth  daily.     promethazine (PHENERGAN) 12.5 MG tablet Take 1 tablet (12.5 mg total) by mouth every 6 (six) hours as needed for nausea or vomiting.    No facility-administered encounter medications on file as of 03/29/2022.      [place] Clinical Pharmacist Assistant [phone]

## 2022-04-02 ENCOUNTER — Ambulatory Visit: Payer: PPO | Admitting: Pharmacist

## 2022-04-02 ENCOUNTER — Telehealth: Payer: Self-pay | Admitting: Cardiology

## 2022-04-02 DIAGNOSIS — J4530 Mild persistent asthma, uncomplicated: Secondary | ICD-10-CM

## 2022-04-02 DIAGNOSIS — I10 Essential (primary) hypertension: Secondary | ICD-10-CM

## 2022-04-02 NOTE — Telephone Encounter (Signed)
Eldoris, Beiser - 04/02/2022 11:32 AM Freada Bergeron, MD  Sent: Tue Apr 02, 2022  2:34 PM  To: Nuala Alpha, LPN          Message  Brilliant plan. Thank you!

## 2022-04-02 NOTE — Telephone Encounter (Signed)
On 5/15 pt called in with complaints of feeling tired and fatigued, which she attributed was coming from taking Toprol XL 12.5 mg daily.  At that time she asked Dr. Johney Frame if she could stop taking Toprol XL 12.5 mg po daily and just continue her losartan 100 mg daily, for her pressures and rates were doing great, and she was feeling great at that time.  Dr. Maudie Mercury for her to stop Toprol XL and see how she feels.  She has been off this medication since that encounter.  Pt has been off of Toprol XL since 5/15 call, and notes she still feels tired and fatigued.   She states she doesn't think her symptoms of feeling tired had anything to do with taking Toprol XL, for she has been off of this for a couple weeks, and still feels the same, plus having additional symptoms.  She states also since stopping the med, her pressures and rates are elevating.  She is occasionally tachycardic and feels palpitations.  She is still taking all her other cardiac meds.   Pt states she had an episode last night of tachycardia and her BP was elevated at 151/70 and HR-90.  She is asking for advisement on what to do.   Advised the pt to start back taking her Toprol XL 12.5 mg po daily, for her symptoms are more than likely coming from stopping this drug on 5/15 telephone call.  Advised her to restart back taking this medication along with all her other cardiac meds, until she is seen in the clinic for this on Thursday 6/1 at 3:20 pm with Dayna Dunn PA-C.   Advised her to start back taking Toprol XL 12.5 mg po daily, and continue to monitor her numbers at home.  Advised her to log them and bring to her OV appt with Dayna on 6/1.  Advised her to increase her po water intake.  Advised her to eat a meal then take her Toprol XL.  Advised her to take this at the same time each day.  ED precautions provided to the pt if symptoms were to worsen or persist on this plan, between now and her appt with Dayna on  Thursday.  Informed the pt that Dr. Johney Frame is out of the office this week, but I will route this message to her to further review, upon return to the office.  Pt verbalized understanding and agrees with this plan.

## 2022-04-02 NOTE — Telephone Encounter (Signed)
Patient c/o Palpitations:  High priority if patient c/o lightheadedness, shortness of breath, or chest pain  How long have you had palpitations/irregular HR/ Afib? Are you having the symptoms now? Tachycardia episode last night, extremely tired at this time-  Are you currently experiencing lightheadedness, SOB or CP? no  Do you have a history of afib (atrial fibrillation) or irregular heart rhythm?   Have you checked your BP or HR? (document readings if available):  last night it 151/77 heart rate 90  Are you experiencing any other symptoms? extremely tired- wants to be seen asap- I made an appointment with Melina Copa for Thursday

## 2022-04-02 NOTE — Progress Notes (Signed)
Chronic Care Management Pharmacy Note  04/02/2022 Name:  Brianna Mccarty MRN:  [id number] DOB:  1941/04/26  Summary: BP is not at goal < 140/90 and is fluctuating  Recommendations/Changes made from today's visit: -Recommended calling cardiology about BP and HR fluctuating and fatigue -Recommend repeat vitamin D level  Plan: BP assessment in 2 months Follow up in 3 months  Subjective: Brianna Mccarty is an 81 y.o. year old female who is a primary patient of Koberlein, Steele Berg, MD.  The CCM team was consulted for assistance with disease management and care coordination needs.    Engaged with patient by telephone for follow up visit in response to provider referral for pharmacy case management and/or care coordination services.   Consent to Services:  The patient was given information about Chronic Care Management services, agreed to services, and gave verbal consent prior to initiation of services.  Please see initial visit note for detailed documentation.   Patient Care Team: Caren Macadam, MD as PCP - General (Family Medicine) Freada Bergeron, MD as PCP - Cardiology (Cardiology) Dorothy Spark, MD as Consulting Physician (Cardiology) Ladene Artist, MD as Consulting Physician (Gastroenterology) Festus Aloe, MD as Consulting Physician (Urology) [name], Darrick Grinder, MD as Consulting Physician (Allergy and Immunology) Monna Fam, MD as Consulting Physician (Ophthalmology) [name], MD (Dermatology) [name], MD as Consulting Physician (Orthopedic Surgery) Viona Gilmore, [facility] as Pharmacist (Pharmacist)  Recent office visits: 03/20/22 Micheline Rough, MD: Patient presented for fatigue and rash.  02/26/22 Alysia Penna, MD: Patient presented for Strep infection. Prescribed Z-pak.  02/11/22 Alysia Penna, MD: Patient presented for virual URI with cough.  01/31/22 Dorothyann Peng, NP: Patient presented for ear pain. Prescribed prednisone  taper.  Recent consult visits: 03/18/22 [name], MD (Allergy): Patient presented for Allergic rhinitis due to pollen. Unable to access notes.  03/12/22 Ronad Gioffre (emergeortho): Patient presented for right shoulder pain. Unable to access notes.  02/21/22 Marny Lowenstein, NP (OBGYN): Patient presented for pelvic and breast exam. Follow up in 1 year.  01/28/22 [name], MD (Allergy): Patient presented for Allergic rhinitis due to pollen. Unable to access notes.  01/16/22 Richardson Dopp, PA-C (cardiology): Patient presented for follow up for HTN and palpitations. Recheck lipids in 3-4 months. Plan for 14 day monitor.  01/03/22 [name], MD (Allergy): Patient presented for Allergic rhinitis due to pollen. Unable to access notes.  12/31/21 Leta Baptist - (Otolaryngology) - Patient presented for bilateral impacted cerumen. No other visit details available.  12/28/21 [name] - (Dermatology) - Patient presented for follow up examination after completed treatment for malignant neoplasm and other concerns. No other visit details available.  12/17/21 [name], MD (Allergy): Patient presented for Allergic rhinitis due to pollen. Unable to access notes.  10/22/21 [name]  - Patient presented for Allergy injection   10/09/21  [name], [name]  - Patient presented for Allergy injection  Hospital visits: Medication Reconciliation was completed by comparing discharge summary, patient's EMR and Pharmacy list, and upon discussion with patient.  Patient presented to [facility] on 03/03/22 for acute recurrent sinusitis. Patient was present for 1 hour.     New?Medications Started at [facility] Discharge:?? -started  Benzonatate Doxycycline Fluconazole Prednisone Promethazine-DM  Medication Changes at Hospital Discharge: -Changed  None   Medications Discontinued at Hospital Discharge: -Stopped  None  Medications that remain the  same after Hospital Discharge:??  -All other medications  will remain the same.    Medication Reconciliation was completed by comparing discharge summary, patient's EMR and Pharmacy list, and upon discussion with patient.  Patient presented to [facility] on 02/08/22 for right leg pain. Patient was present for 1 hour.     New?Medications Started at [facility] Discharge:?? -started  Methylprednisolone dosepak  Medication Changes at Hospital Discharge: -Changed  None   Medications Discontinued at Hospital Discharge: -Stopped  None  Medications that remain the same after Hospital Discharge:??  -All other medications will remain the same.    Medication Reconciliation was completed by comparing discharge summary, patient's EMR and Pharmacy list, and upon discussion with patient. Patient presented to [facility] ED on 10/21/21 due to Palpitations and other concerns. Patient was present for 6 hours.    New?Medications Started at [facility] Discharge:?? -started  None   Medication Changes at Hospital Discharge: -Changed  None   Medications Discontinued at Hospital Discharge: -Stopped  None   Medications that remain the same after Hospital Discharge:??  -All other medications will remain the same.     Objective:  Lab Results  Component Value Date   CREATININE 1.17 03/20/2022   BUN 32 (H) 03/20/2022   GFR 43.51 (L) 03/20/2022   GFRNONAA >60 10/21/2021   GFRAA 84 07/24/2020   NA 135 03/20/2022   K 4.6 03/20/2022   CALCIUM 9.6 03/20/2022   CO2 27 03/20/2022   GLUCOSE 93 03/20/2022    Lab Results  Component Value Date/Time   HGBA1C 5.9 03/20/2022 03:32 PM   HGBA1C 5.9 11/09/2021 09:38 AM   GFR 43.51 (L) 03/20/2022 03:32 PM   GFR 91.84 10/14/2018 01:49 PM    Last diabetic Eye exam:  Lab Results  Component Value Date/Time   HMDIABEYEEXA No Retinopathy 07/10/2016 12:00 AM    Last diabetic Foot exam: No results found for: HMDIABFOOTEX    Lab Results  Component Value Date   CHOL 267 (H) 11/09/2021   HDL 85.70 11/09/2021   LDLCALC 173 (H) 11/09/2021   LDLDIRECT 126.6 07/18/2010   TRIG 42.0 11/09/2021   CHOLHDL 3 11/09/2021       Latest Ref Rng & Units 03/20/2022    3:32 PM 10/21/2021    8:51 AM 07/18/2021    1:54 PM  Hepatic Function  Total Protein 6.0 - 8.3 g/dL 6.2   6.8   6.7    Albumin 3.5 - 5.2 g/dL 3.9   4.0   4.2    AST 0 - 37 U/L _0 ALT 0 - 35 U/L _1 Alk Phosphatase 39 - 117 U/L 27   50   32    Total Bilirubin 0.2 - 1.2 mg/dL 0.4   0.8   0.7      Lab Results  Component Value Date/Time   TSH 1.94 03/20/2022 03:32 PM   TSH 0.97 11/09/2021 09:38 AM       Latest Ref Rng & Units 03/20/2022    3:32 PM 10/21/2021    8:51 AM 08/02/2021    3:17 AM  CBC  WBC 4.0 - 10.5 K/uL 8.5   7.3   10.5    Hemoglobin 12.0 - 15.0 g/dL 11.8   12.3   10.9    Hematocrit 36.0 - 46.0 % 34.9   37.9   32.9    Platelets 150.0 - 400.0 K/uL 221.0   244   206  Lab Results  Component Value Date/Time   VD25OH 29.28 (L) 11/09/2021 09:38 AM   VD25OH 31 05/12/2020 02:48 PM   VD25OH 36.7 05/31/2019 09:52 AM    Clinical ASCVD: No  The ASCVD Risk score (Arnett DK, et al., 2019) failed to calculate for the following reasons:   The 2019 ASCVD risk score is only valid for ages 81 to 70       03/20/2022    2:40 PM 02/26/2022    4:45 PM 03/27/2021    1:14 PM  Depression screen PHQ 2/9  Decreased Interest 3 0 0  Down, Depressed, Hopeless 0 0 0  PHQ - 2 Score 3 0 0  Altered sleeping 0 0   Tired, decreased energy 3 2   Change in appetite 0 0   Feeling bad or failure about yourself  0 0   Trouble concentrating 0 0   Moving slowly or fidgety/restless 0 0   Suicidal thoughts 0 0   PHQ-9 Score 6 2   Difficult doing work/chores Somewhat difficult Not difficult at all      Social History   Tobacco Use  Smoking Status Never  Smokeless Tobacco Never   BP Readings from Last 3 Encounters:  03/20/22  130/80  03/03/22 122/81  02/26/22 120/76   Pulse Readings from Last 3 Encounters:  03/20/22 60  03/03/22 67  02/26/22 62   Wt Readings from Last 3 Encounters:  03/20/22 168 lb 9.6 oz (76.5 kg)  03/03/22 164 lb 14.5 oz (74.8 kg)  02/26/22 165 lb (74.8 kg)   BMI Readings from Last 3 Encounters:  03/20/22 30.84 kg/m  03/03/22 30.16 kg/m  02/26/22 30.18 kg/m    Assessment/Interventions: Review of patient past medical history, allergies, medications, health status, including review of consultants reports, laboratory and other test data, was performed as part of comprehensive evaluation and provision of chronic care management services.   SDOH:  (Social Determinants of Health) assessments and interventions performed: Yes   SDOH Screenings   Alcohol Screen: Not on file  Depression (PHQ2-9): Medium Risk   PHQ-2 Score: 6  Financial Resource Strain: Medium Risk   Difficulty of Paying Living Expenses: Somewhat hard  Food Insecurity: Not on file  Housing: Not on file  Physical Activity: Not on file  Social Connections: Not on file  Stress: Not on file  Tobacco Use: Low Risk    Smoking Tobacco Use: Never   Smokeless Tobacco Use: Never   Passive Exposure: Not on file  Transportation Needs: No Transportation Needs   Lack of Transportation (Medical): No   Lack of Transportation (Non-Medical): No   CCM Care Plan  Allergies  Allergen Reactions   Celebrex [Celecoxib] Diarrhea   Cephalexin     Reaction was a high fever   Hydrochlorothiazide     hyponatremia   Penicillins Hives and Swelling    Swelling of arms & face Has patient had a PCN reaction causing immediate rash, facial/tongue/throat swelling, SOB or lightheadedness with hypotension: Yes Has patient had a PCN reaction causing severe rash involving mucus membranes or skin necrosis: No Has patient had a PCN reaction that required hospitalization: No Has patient had a PCN reaction occurring within the last 10 years:  No If all of the above answers are "NO", then may proceed with Cephalosporin use.    Phenobarbital Hives   Statins     Muscle pain   Passion Fruit Flavor Diarrhea and Rash   Tape Rash    MEDICAL TAPE  Medications Reviewed Today     Reviewed by Viona Gilmore, [facility] (Pharmacist) on 04/02/22 at 1047  Med List Status: <None>   Medication Order Taking? Sig Documenting Provider Last Dose Status Informant  acyclovir (ZOVIRAX) 400 MG tablet [id number] No Take 1 tablet (400 mg total) by mouth 5 (five) times daily as needed (fever blisters). [name], NP Taking Active   albuterol [facility] HFA) 108 (90 Base) MCG/ACT inhaler [id number] No Inhale 2 puffs into the lungs every 6 (six) hours as needed for wheezing. Noralee Space, MD Taking Active Self  albuterol (PROVENTIL) (2.5 MG/3ML) 0.083% nebulizer solution [id number] No Take 3 mLs (2.5 mg total) by nebulization every 6 (six) hours as needed. Noralee Space, MD Taking Active Self  alendronate (FOSAMAX) 70 MG tablet [id number] No TAKE 1 TABLET EVERY 7 (SEVEN) DAYS. TAKE WITH A FULL GLASS OF WATER ON AN EMPTY STOMACH. Caren Macadam, MD Taking Active Self           Med Note Jilda Roche A   Fri Jul 13, 2021 11:56 AM) Tuesdays  amLODipine (NORVASC) 10 MG tablet [id number]  Take 1 tablet (10 mg total) by mouth daily. Caren Macadam, MD  Active   azelastine (ASTELIN) 0.1 % nasal spray [id number] No Instill 2 sprays into each nostril two times a day as needed for allergies Scot Jun, FNP Taking Active Self  Bempedoic Acid (NEXLETOL) 180 MG TABS [id number] No Take 1 tablet by mouth daily. Freada Bergeron, MD Taking Active   benzonatate (TESSALON) 100 MG capsule [id number] No Take 1-2 capsules (100-200 mg total) by mouth 3 (three) times daily as needed for cough. Jaynee Eagles, PA-C Taking Active   betamethasone dipropionate 0.05 % cream [id number] No Apply 1 application topically 2 (two) times daily as needed (scaly skin).  [provider] Taking Active Self  budesonide-formoterol (SYMBICORT) 160-4.5 MCG/ACT inhaler [id number] No Inhale 2 puffs into the lungs 2 (two) times daily. Caren Macadam, MD Taking Active   Cholecalciferol 125 MCG (5000 UT) TABS [id number] No Take 1 tablet by mouth daily. [provider] Taking Active   desmopressin (DDAVP) 0.2 MG tablet [id number] No Take 0.2 mg by mouth at bedtime. [provider] Taking Active Self  EPINEPHrine 0.3 mg/0.3 mL IJ SOAJ injection [id number] No Inject 0.3 mg into the muscle as needed for anaphylaxis. [provider] Taking Active Self  fexofenadine (ALLEGRA) 180 MG tablet [id number] No Take 180 mg by mouth daily. [provider] Taking Active Self  fluconazole (DIFLUCAN) 150 MG tablet [id number] No Take 1 tablet (150 mg total) by mouth once a week. Jaynee Eagles, PA-C Taking Active   furosemide (LASIX) 20 MG tablet [id number] No Take 1 tablet (20 mg total) by mouth daily as needed for edema. Freada Bergeron, MD Taking Active   hydrALAZINE (APRESOLINE) 50 MG tablet [id number] No TAKE 1 TAB 3 TIMES A DAY AND TAKE EXTRA TAB IF SBP>160  Patient taking differently: TAKE 1 TAB 2 TIMES A DAY AND TAKE EXTRA TAB IF SBP>160   Freada Bergeron, MD Taking Active   hydrOXYzine (ATARAX/VISTARIL) 10 MG tablet [id number] No Take 10 mg by mouth at bedtime.  [provider] Taking Active Self  losartan (COZAAR) 100 MG tablet [id number] No TAKE 1 TABLET BY MOUTH EVERY DAY Koberlein, Junell C, MD Taking Active   METAMUCIL FIBER PO [id number] No Take 1 capsule by mouth once a week. [provider] Taking Active Self  montelukast (SINGULAIR) 10  MG tablet [id number] No Take 10 mg by mouth at bedtime. [provider] Taking Active Self  omeprazole (PRILOSEC) 40 MG capsule [id number] No TAKE 1 CAPSULE BY MOUTH EVERY DAY Koberlein, Junell C, MD Taking Active   pentosan polysulfate (ELMIRON) 100 MG capsule [id number] No Take  100 mg by mouth daily. [provider] Taking Active Self  polyethylene glycol (MIRALAX / GLYCOLAX) 17 g packet [id number] No Take 17 g by mouth daily as needed for moderate constipation. [provider] Taking Active Self  prazosin (MINIPRESS) 5 MG capsule [id number] No *NEED OFFICE VISIT* TAKE 1 CAPSULE BY MOUTH TWICE A DAY Koberlein, Junell C, MD Taking Active   Probiotic Product (PROBIOTIC PO) [id number] No Take 1 capsule by mouth daily.  [provider] Taking Active Self  promethazine (PHENERGAN) 12.5 MG tablet [id number]  Take 1 tablet (12.5 mg total) by mouth every 6 (six) hours as needed for nausea or vomiting. 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/2022   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   Genetic testing 09/13/2020   Family history of pancreatic cancer 09/05/2020   Family history of colon cancer 09/05/2020   Family history of cancer of extrahepatic bile ducts 09/05/2020   Fever 07/26/2020   History of COVID-19 07/26/2020   History of melanoma 05/12/2020   Macular degeneration 02/17/2019   OSA (obstructive sleep apnea) 02/12/2018   Hyperglycemia 02/12/2018   H/O cold sores 11/27/2015   Murmur 10/20/2015   Carotid artery disease ([place])    Thyroid cyst    Venous (peripheral) insufficiency 06/02/2009   Asthma 04/27/2008   Melanoma of skin ([place]) 02/10/2008   Allergic rhinitis 02/10/2008   INTERSTITIAL CYSTITIS 02/10/2008   Pure hypercholesterolemia 02/09/2008   Essential hypertension 02/09/2008   GERD 02/09/2008   Osteoporosis 02/09/2008    Immunization History  Administered Date(s) Administered   Fluad Quad(high Dose 65+) 07/02/2019   Influenza Split 09/18/2011, 08/11/2012, 07/28/2014    Influenza Whole 07/21/2008, 08/04/2009, 08/01/2010   Influenza, High Dose Seasonal PF 07/19/2013, 07/28/2015, 10/17/2016, 08/11/2017, 09/01/2017, 08/04/2018, 09/11/2018, 09/01/2019, 06/30/2021   Influenza,inj,Quad PF,6+ Mos 07/03/2016   Moderna Covid-19 Vaccine Bivalent Booster 63yr & up 09/24/2021   PFIZER(Purple Top)SARS-COV-2 Vaccination 11/20/2019, 12/08/2019, 03/22/2020   Pneumococcal Conjugate-13 12/06/2013   Pneumococcal Polysaccharide-23 10/03/2008, 10/17/2016, 11/24/2017, 09/11/2018, 07/12/2019, 09/01/2019   Tdap 11/04/2008, 10/24/2015   Zoster Recombinat (Shingrix) 07/30/2019, 09/03/2019, 02/21/2020   Zoster, Live 11/05/2003   Patient is still experiencing a lot of fatigue. She stopped the metoprolol over 2 weeks ago and has not noticed any difference in how she is feeling. She sleeps through the night and gets up once to go to the bathroom sometimes but otherwise feels like she is getting a good amount of sleep. Patient slept from 11:30 - 9pm last night.  Patient has been taking naps with the fatigue but this only started when she noticed the fatigue.  Patient has noticed that she is taking the fluid pill more often lately. She wasn't taking it every day before and is now taking it 4-5 days a week. She measures around her wrist everyday to see if there is fluid building up as she does not notice it around her ankles. Patient does  weigh herself every day and only notices a change with bowel movements.   Conditions to be addressed/monitored:  Hypertension, Hyperlipidemia, Asthma, Osteoporosis, Osteoarthritis, and Allergic Rhinitis  Conditions addressed this visit: Hypertension, asthma  Care Plan : CCM Pharmacy Care Plan  Updates made by Viona Gilmore, [place] since 04/02/2022 12:00 AM     Problem: Problem: Hypertension, Hyperlipidemia, Asthma, Osteoporosis, Osteoarthritis, and Allergic Rhinitis      Long-Range Goal: Patient-Specific Goal   Start Date: 10/01/2021  Expected  End Date: 10/01/2022  Recent Progress: On track  Priority: High  Note:   Current Barriers:  Unable to independently monitor therapeutic efficacy  Pharmacist Clinical Goal(s):  Patient will achieve adherence to monitoring guidelines and medication adherence to achieve therapeutic efficacy through collaboration with PharmD and provider.   Interventions: 1:1 collaboration with Caren Macadam, MD regarding development and update of comprehensive plan of care as evidenced by provider attestation and co-signature Inter-disciplinary care team collaboration (see longitudinal plan of care) Comprehensive medication review performed; medication list updated in electronic medical record  Hypertension (BP goal <140/90) -Uncontrolled -Current treatment: Amlodipine 10 mg 1 tablet daily - Appropriate, Query effective, Safe, Accessible Hydralazine 50 mg 1 tablet three times daily - taking one in morning and one at night - Appropriate, Query effective, Safe, Accessible Losartan 100 mg 1 tablet daily  - Appropriate, Query effective, Safe, Accessible Prazosin 5 mg one in the morning and one at night - Appropriate, Query effective, Query Safe, Accessible -Medications previously tried: metoprolol (fatigue) -Current home readings: 152/69, 149/73, 151/77  HR 78, 90 (checking) -Current dietary habits: pays close attention to salt intake - eats out often but eats a lot of salads; grilled meat and not beef often; does read package labels -Current exercise habits: no changes with activity  -Denies hypotensive/hypertensive symptoms -Educated on BP goals and benefits of medications for prevention of heart attack, stroke and kidney damage; Importance of home blood pressure monitoring; Proper BP monitoring technique; Symptoms of hypotension and importance of maintaining adequate hydration; -Counseled to monitor BP at home at least weekly, document, and provide log at future appointments -Counseled on diet and  exercise extensively Recommended to continue current medication  Hyperlipidemia: (LDL goal < 100) -Not ideally controlled -Current treatment: Nexletol 180 mg 1 tablet daily -Medications previously tried: statins (muscle pain)  -Current dietary patterns: little beef; cooks with olive oil or vegetable oil -Current exercise habits: not able to right now -Educated on Cholesterol goals;  Importance of limiting foods high in cholesterol; -Counseled on diet and exercise extensively Recommended to continue current medication Assessed patient finances. Patient is approved with [employer].  Swelling (Goal: minimize fluid retention) -Controlled -Current treatment  Furosemide 20 mg 1 tablet as needed - Appropriate, Effective, Safe, Accessible -Medications previously tried: none  -Recommended to continue current medication Patient only takes this 4-5 times a week.  Asthma (Goal: control symptoms) -Controlled -Current treatment  Albuterol HFA as needed - Appropriate, Effective, Safe, Accessible Albuterol nebulizer as needed - Appropriate, Effective, Safe, Accessible Symbicort 160-4.5 mcg 2 puffs twice daily - Appropriate, Effective, Safe, Accessible Montelukast 10 mg 1 tablet at bedtime - Appropriate, Effective, Safe, Accessible -Medications previously tried: unknown  -Pulmonary function testing: n/a -Patient reports consistent use of maintenance inhaler -Frequency of rescue inhaler use: hasn't used since the fall -Counseled on Proper inhaler technique; Benefits of consistent maintenance inhaler use -Counseled on diet and exercise extensively Recommended to continue current medication Assessed patient finances. Apply for Symbicort PAP.  Allergic rhinitis (Goal:  minimize symptoms) -Controlled -Current treatment  Azelastine nasal spray as needed Allegra 180 mg 1 tablet daily Saline solution once a day Hydroxyzine 10 mg once at bedtime  -Medications previously tried: n/a   -Recommended to continue current medication  Osteoporosis (Goal prevent fractures) -Controlled -Last DEXA Scan: 05/2020   T-Score femoral neck: -2.0  T-Score total hip: n/a  T-Score lumbar spine: 0.8  T-Score forearm radius: n/a  10-year probability of major osteoporotic fracture: 15%  10-year probability of hip fracture: 4.4% -Patient is a candidate for pharmacologic treatment due to T-Score -1.0 to -2.5 and 10-year risk of hip fracture > 3% -Current treatment  Alendronate 70 mg 1 tablet weekly - Tuesdays Calcium with vitamin D (400 mg with 12.5 mcg) twice daily -Medications previously tried: none  -Recommend [phone] units of vitamin D daily. Recommend 1200 mg of calcium daily from dietary and supplemental sources. Recommend weight-bearing and muscle strengthening exercises for building and maintaining bone density. -Counseled on diet and exercise extensively Recommended to continue current medication Recommended repeat vitamin D level.  GERD (Goal: minimize symptoms) -Controlled -Current treatment  Omeprazole 40 mg 1 capsule daily - Appropriate, Effective, Safe, Accessible -Medications previously tried: none  -Counseled on risks of taking PPIs long term. Recommended every other day use and can supplement with Tums if needed.  Health Maintenance -Vaccine gaps: none -Current therapy:  Metamucil once a week as needed Miralax as needed Probiotic daily Juice plus 2 daily Omegas by juice plus once daily QBC plex - takes it once a week now -Educated on Cost vs benefit of each product must be carefully weighed by individual consumer -Patient is satisfied with current therapy and denies issues -Recommended to continue current medication  Patient Goals/Self-Care Activities Patient will:  - take medications as prescribed as evidenced by patient report and record review check blood pressure at least weekly, document, and provide at future appointments target a minimum of 150  minutes of moderate intensity exercise weekly  Follow Up Plan: Telephone follow up appointment with care management team member scheduled for: 6 months      Medication Assistance: Application for Symbicort  medication assistance program. in process.  Anticipated assistance start date 10/03/21.  See plan of care for additional detail.  Compliance/Adherence/Medication fill history: Care Gaps: BP- 130/80 ( 03/20/22)  Star-Rating Drugs: Losartan (Cozaar) 100 mg - [facility]/25/22 [facility]  Patient's preferred [facility]:  [id number] [place] [address] [address]Phone: [phone]: [phone]  Uses pill box? Yes - uses a tackle box for; then weekly for supplements Pt endorses 99% compliance   We discussed: Benefits of medication synchronization, packaging and delivery as well as enhanced pharmacist oversight with [employer]. Patient decided to: Continue current medication management strategy  Care Plan and Follow Up Patient Decision:  Patient agrees to Care Plan and Follow-up.  Plan: Telephone follow up appointment with care management team member scheduled for:  6 months  MJeni Salles PharmD, [place] [id number]

## 2022-04-03 DIAGNOSIS — J301 Allergic rhinitis due to pollen: Secondary | ICD-10-CM | POA: Diagnosis not present

## 2022-04-03 DIAGNOSIS — J3089 Other allergic rhinitis: Secondary | ICD-10-CM | POA: Diagnosis not present

## 2022-04-03 NOTE — Progress Notes (Unsigned)
Office Visit    Patient Name: Brianna Mccarty Date of Encounter: 04/03/2022  Primary Care Provider:  Caren Macadam, MD Primary Cardiologist:  Freada Bergeron, MD Primary Electrophysiologist: None  Chief Complaint    Brianna Mccarty is a 81 y.o. female with PMH of HTN, HLD, OSA, SVT, and mild AS who presents today for follow-up of palpitations and fatigue.  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 mild by carotid duplex.   Cervical dysplasia 1971   Coronary artery disease    Diverticulosis of colon    DJD (degenerative joint disease)    Family history of cancer of extrahepatic bile ducts 09/05/2020   Family history of colon cancer 09/05/2020   Family history of pancreatic cancer 09/05/2020   GERD (gastroesophageal reflux disease)    Headache(784.0)    History of kidney stones    Hypercholesterolemia    Hypertension    Interstitial cystitis    sees urologist   Lichen sclerosus    Malignant melanoma ([place]) 2006   sees Dr. [place] Crane in dermatology   Migraines    Mild aortic stenosis    Mitral valve disease    Question mitral valve prolapse in the past, no prolapse by echo 2009   Murmur 10/20/2015   SEES DR NELSON   OSA (obstructive sleep apnea)    USES DENTAL DEVICE   Osteoporosis    on fosomax > 5 years, stopped 11/2015   Pneumonia    SVT (supraventricular tachycardia) ([place]) 02/15/2022   Monitor 02/2022: NSR, avg HR 61; 2 runs of NSVT (4 beats); several runs of Supraventricular Tachycardia (longest 3"11'); no AFib   Thyroid cyst    1 x 1.1 thyroid cyst noted on carotid Doppler, January, 2012   Venous insufficiency    Past Surgical History:  Procedure Laterality Date   BREAST BIOPSY Left 11/25/2011    U/S core, benign performed at [facility], LEFT BREAT MARKER IN   BREAST CYST ASPIRATION     BREAST SURGERY  2013   Breast Bx-Benign   CARDIAC CATHETERIZATION     CATARACT EXTRACTION, BILATERAL     CHOLECYSTECTOMY N/A 12/22/2019   Procedure: LAPAROSCOPIC CHOLECYSTECTOMY;  Surgeon: Ileana Roup, MD;  Location: [place];  Service: General;  Laterality: N/A;   cystoscopy and basket stone removal right ureter  02/2006   Dr. Robbi Garter SURGERY  12/22/2019   [facility]   left total hip replacement  2004   Dr. Gladstone Lighter   melanoma removed from medial rleft knee area  2006   Dr. [place] Crane   NASAL SEPTUM SURGERY     TOTAL HIP ARTHROPLASTY Right 08/01/2021   Procedure: TOTAL HIP ARTHROPLASTY ANTERIOR APPROACH;  Surgeon: Gaynelle Arabian, MD;  Location: [facility];  Service: Orthopedics;  Laterality: Right;    Allergies  Allergies  Allergen Reactions   Celebrex [Celecoxib] Diarrhea   Cephalexin     Reaction was a high fever   Hydrochlorothiazide     hyponatremia   Penicillins Hives and Swelling    Swelling of arms & face Has patient had a PCN reaction causing immediate rash, facial/tongue/throat swelling, SOB or  lightheadedness with hypotension: Yes Has patient had a PCN reaction causing severe rash involving mucus membranes or skin necrosis: No Has patient had a PCN reaction that required hospitalization: No Has patient had a PCN reaction occurring within the last 10 years: No If all of the above answers are "NO", then may proceed with Cephalosporin use.    Phenobarbital Hives   Statins     Muscle pain   Passion Fruit Flavor Diarrhea and Rash   Tape Rash    MEDICAL TAPE    History of Present Illness    Brianna Mccarty is a 81 year old with above-mentioned past medical history.  She was previously followed by Dr. Meda Coffee and is now followed by Dr. Johney Frame.  She had nuclear stress test on 2018 that was normal following complaint of significant  fatigue. 2D echo was completed 06/2018 that showed EF of 55-60% with grade 1 DD, mild aortic stenosis with AVA 1.5cm2, mean gradient 64m Hg and severely dilated LA. she has a history of statin intolerance and is currently on Nexletol.  Carotid U/S completed 8/20 with mild disease present.  She had additional Lexiscan Myoview completed 8/22 with EF of 74 and normal perfusion low risk study.  2D echo was also completed on 6/22 with EF of 60-65%, no RWMA, mild concentric LVH, grade 2 DDAS (mean 10.5 mmHg  Patient was seen in the ED on 12/22 with complaint of palpitations.  Ischemic work-up was negative with no complaints of chest pain, SOB, syncope, leg edema.  She was seen in follow-up on 01/2022 and more 14-day ZIO monitor.  Patient was noted to have few extra beats of possible AF following hip surgery.  Event monitor showed no atrial fibrillation with 2 runs of nonsustained VT and 1674 runs of SVT with longest lasting 3 minutes.  She was started on low-dose Toprol-XL 25 however experienced worsened fatigue and eventually stopped due to symptoms.   Since last being seen in the office patient reports***.  Patient denies chest pain, palpitations, dyspnea, PND, orthopnea, nausea, vomiting, dizziness, syncope, edema, weight gain, or early satiety.     ***Notes:  Home Medications    Current Outpatient Medications  Medication Sig Dispense Refill   acyclovir (ZOVIRAX) 400 MG tablet Take 1 tablet (400 mg total) by mouth 5 (five) times daily as needed (fever blisters). 30 tablet 2   albuterol (PROAIR HFA) 108 (90 Base) MCG/ACT inhaler Inhale 2 puffs into the lungs every 6 (six) hours as needed for wheezing. 3 Inhaler 3   albuterol (PROVENTIL) (2.5 MG/3ML) 0.083% nebulizer solution Take 3 mLs (2.5 mg total) by nebulization every 6 (six) hours as needed. 360 mL 3   alendronate (FOSAMAX) 70 MG tablet TAKE 1 TABLET EVERY 7 (SEVEN) DAYS. TAKE WITH A FULL GLASS OF WATER ON AN EMPTY STOMACH. 12 tablet 3    amLODipine (NORVASC) 10 MG tablet Take 1 tablet (10 mg total) by mouth daily. 90 tablet 1   azelastine (ASTELIN) 0.1 % nasal spray Instill 2 sprays into each nostril two times a day as needed for allergies 30 mL 0   Bempedoic Acid (NEXLETOL) 180 MG TABS Take 1 tablet by mouth daily. 90 tablet 1   benzonatate (TESSALON) 100 MG capsule Take 1-2 capsules (100-200 mg total) by mouth 3 (three) times daily as needed for cough. 60 capsule 0   betamethasone dipropionate 0.05 % cream Apply 1 application topically 2 (two) times daily as needed (scaly skin).     budesonide-formoterol (SYMBICORT) 160-4.5 MCG/ACT inhaler  Inhale 2 puffs into the lungs 2 (two) times daily. 3 each 3   Cholecalciferol 125 MCG (5000 UT) TABS Take 1 tablet by mouth daily.     desmopressin (DDAVP) 0.2 MG tablet Take 0.2 mg by mouth at bedtime.     EPINEPHrine 0.3 mg/0.3 mL IJ SOAJ injection Inject 0.3 mg into the muscle as needed for anaphylaxis.     fexofenadine (ALLEGRA) 180 MG tablet Take 180 mg by mouth daily.     fluconazole (DIFLUCAN) 150 MG tablet Take 1 tablet (150 mg total) by mouth once a week. 2 tablet 0   furosemide (LASIX) 20 MG tablet Take 1 tablet (20 mg total) by mouth daily as needed for edema. 30 tablet 3   hydrALAZINE (APRESOLINE) 50 MG tablet TAKE 1 TAB 3 TIMES A DAY AND TAKE EXTRA TAB IF SBP>160 (Patient taking differently: TAKE 1 TAB 2 TIMES A DAY AND TAKE EXTRA TAB IF SBP>160) 315 tablet 2   hydrOXYzine (ATARAX/VISTARIL) 10 MG tablet Take 10 mg by mouth at bedtime.      losartan (COZAAR) 100 MG tablet TAKE 1 TABLET BY MOUTH EVERY DAY 90 tablet 1   METAMUCIL FIBER PO Take 1 capsule by mouth once a week.     montelukast (SINGULAIR) 10 MG tablet Take 10 mg by mouth at bedtime.     omeprazole (PRILOSEC) 40 MG capsule TAKE 1 CAPSULE BY MOUTH EVERY DAY 90 capsule 3   pentosan polysulfate (ELMIRON) 100 MG capsule Take 100 mg by mouth daily.     polyethylene glycol (MIRALAX / GLYCOLAX) 17 g packet Take 17 g by mouth  daily as needed for moderate constipation.     prazosin (MINIPRESS) 5 MG capsule *NEED OFFICE VISIT* TAKE 1 CAPSULE BY MOUTH TWICE A DAY 180 capsule 1   Probiotic Product (PROBIOTIC PO) Take 1 capsule by mouth daily.      promethazine (PHENERGAN) 12.5 MG tablet Take 1 tablet (12.5 mg total) by mouth every 6 (six) hours as needed for nausea or vomiting. 30 tablet 2   No current facility-administered medications for this visit.     Review of Systems  Please see the history of present illness.    (+)*** (+)***  All other systems reviewed and are otherwise negative except as noted above.  Physical Exam    Wt Readings from Last 3 Encounters:  03/20/22 168 lb 9.6 oz (76.5 kg)  03/03/22 164 lb 14.5 oz (74.8 kg)  02/26/22 165 lb (74.8 kg)   XB:WIOMB were no vitals filed for this visit.,There is no height or weight on file to calculate BMI.  Constitutional:      Appearance: Healthy appearance. Not in distress.  Neck:     Vascular: JVD normal.  Pulmonary:     Effort: Pulmonary effort is normal.     Breath sounds: No wheezing. No rales. Diminished in the bases Cardiovascular:     Normal rate. Regular rhythm. Normal S1. Normal S2.      Murmurs: There is no murmur.  Edema:    Peripheral edema absent.  Abdominal:     Palpations: Abdomen is soft non tender. There is no hepatomegaly.  Skin:    General: Skin is warm and dry.  Neurological:     General: No focal deficit present.     Mental Status: Alert and oriented to person, place and time.     Cranial Nerves: Cranial nerves are intact.  EKG/LABS/Other Studies Reviewed    ECG personally reviewed by me today - ***  Risk Assessment/Calculations:   {Does this patient have ATRIAL FIBRILLATION?:[phone]}        Lab Results  Component Value Date   WBC 8.5 03/20/2022   HGB 11.8 (L) 03/20/2022   HCT 34.9 (L) 03/20/2022   MCV 89.0 03/20/2022   PLT 221.0 03/20/2022   Lab Results  Component Value Date   CREATININE 1.17  03/20/2022   BUN 32 (H) 03/20/2022   NA 135 03/20/2022   K 4.6 03/20/2022   CL 103 03/20/2022   CO2 27 03/20/2022   Lab Results  Component Value Date   ALT 10 03/20/2022   AST 11 03/20/2022   ALKPHOS 27 (L) 03/20/2022   BILITOT 0.4 03/20/2022   Lab Results  Component Value Date   CHOL 267 (H) 11/09/2021   HDL 85.70 11/09/2021   LDLCALC 173 (H) 11/09/2021   LDLDIRECT 126.6 07/18/2010   TRIG 42.0 11/09/2021   CHOLHDL 3 11/09/2021    Lab Results  Component Value Date   HGBA1C 5.9 03/20/2022    Assessment & Plan    1.  Palpitations  2.  Essential hypertension  3.  Pure cholesteremia  4.  Mild aortic stenosis      Disposition: Follow-up with Freada Bergeron, MD or APP in *** months {Are you ordering a CV Procedure (e.g. stress test, cath, DCCV, TEE, etc)?   Press F2        :[id number]}   Medication Adjustments/Labs and Tests Ordered: Current medicines are reviewed at length with the patient today.  Concerns regarding medicines are outlined above.   Signed, Mable Fill, Marissa Nestle, NP 04/03/2022, 12:30 PM [place]

## 2022-04-04 ENCOUNTER — Ambulatory Visit: Payer: PPO | Admitting: Nurse Practitioner

## 2022-04-04 ENCOUNTER — Encounter: Payer: Self-pay | Admitting: Nurse Practitioner

## 2022-04-04 VITALS — BP 124/68 | HR 57 | Ht 62.0 in | Wt 169.0 lb

## 2022-04-04 DIAGNOSIS — I350 Nonrheumatic aortic (valve) stenosis: Secondary | ICD-10-CM | POA: Diagnosis not present

## 2022-04-04 DIAGNOSIS — E7800 Pure hypercholesterolemia, unspecified: Secondary | ICD-10-CM | POA: Diagnosis not present

## 2022-04-04 DIAGNOSIS — I77819 Aortic ectasia, unspecified site: Secondary | ICD-10-CM

## 2022-04-04 DIAGNOSIS — I10 Essential (primary) hypertension: Secondary | ICD-10-CM | POA: Diagnosis not present

## 2022-04-04 DIAGNOSIS — R002 Palpitations: Secondary | ICD-10-CM

## 2022-04-04 NOTE — Patient Instructions (Signed)
Medication Instructions:  Your physician recommends that you continue on your current medications as directed. Please refer to the Current Medication list given to you today.  *If you need a refill on your cardiac medications before your next appointment, please call your pharmacy*   Lab Work: TODAY-BMET, MAG If you have labs (blood work) drawn today and your tests are completely normal, you will receive your results only by: [place] (if you have MyChart) OR A paper copy in the mail If you have any lab test that is abnormal or we need to change your treatment, we will call you to review the results.   Testing/Procedures: NONE ORDERED   Follow-Up: At [facility], you and your health needs are our priority.  As part of our continuing mission to provide you with exceptional heart care, we have created designated Provider Care Teams.  These Care Teams include your primary Cardiologist (physician) and Advanced Practice Providers (APPs -  Physician Assistants and Nurse Practitioners) who all work together to provide you with the care you need, when you need it.  We recommend signing up for the patient portal called "MyChart".  Sign up information is provided on this After Visit Summary.  MyChart is used to connect with patients for Virtual Visits (Telemedicine).  Patients are able to view lab/test results, encounter notes, upcoming appointments, etc.  Non-urgent messages can be sent to your provider as well.   To learn more about what you can do with MyChart, go to [email or web address].    Your next appointment:   3 month(s)  The format for your next appointment:   In Person  Provider:   Freada Bergeron, MD     Other Instructions   Important Information About Sugar

## 2022-04-05 LAB — BASIC METABOLIC PANEL
BUN/Creatinine Ratio: 22 (ref 12–28)
BUN: 16 mg/dL (ref 8–27)
CO2: 24 mmol/L (ref 20–29)
Calcium: 8.9 mg/dL (ref 8.7–10.3)
Chloride: 101 mmol/L (ref 96–106)
Creatinine, Ser: 0.74 mg/dL (ref 0.57–1.00)
Glucose: 89 mg/dL (ref 70–99)
Potassium: 4 mmol/L (ref 3.5–5.2)
Sodium: 137 mmol/L (ref 134–144)
eGFR: 81 mL/min/{1.73_m2} (ref >59)

## 2022-04-05 LAB — MAGNESIUM: Magnesium: 1.9 mg/dL (ref 1.6–2.3)

## 2022-04-06 ENCOUNTER — Ambulatory Visit
Admission: RE | Admit: 2022-04-06 | Discharge: 2022-04-06 | Disposition: A | Discharge status: Home / Self Care | Payer: PPO | Source: Ambulatory Visit | Attending: General Surgery | Admitting: General Surgery

## 2022-04-06 DIAGNOSIS — K863 Pseudocyst of pancreas: Secondary | ICD-10-CM | POA: Diagnosis not present

## 2022-04-06 DIAGNOSIS — N281 Cyst of kidney, acquired: Secondary | ICD-10-CM | POA: Diagnosis not present

## 2022-04-06 DIAGNOSIS — Z9049 Acquired absence of other specified parts of digestive tract: Secondary | ICD-10-CM | POA: Diagnosis not present

## 2022-04-06 DIAGNOSIS — K862 Cyst of pancreas: Secondary | ICD-10-CM

## 2022-04-06 DIAGNOSIS — R935 Abnormal findings on diagnostic imaging of other abdominal regions, including retroperitoneum: Secondary | ICD-10-CM | POA: Diagnosis not present

## 2022-04-06 MED ORDER — GADOBENATE DIMEGLUMINE 529 MG/ML IV SOLN
15.0000 mL | Freq: Once | INTRAVENOUS | Status: AC | PRN
  Administered 2022-04-06: 15 mL via INTRAVENOUS

## 2022-04-08 ENCOUNTER — Other Ambulatory Visit: Payer: Self-pay | Admitting: *Deleted

## 2022-04-08 MED ORDER — NEXLETOL 180 MG PO TABS: 1.0000 | ORAL_TABLET | Freq: Every day | ORAL | 3 refills | Status: DC

## 2022-04-09 ENCOUNTER — Ambulatory Visit: Payer: PPO

## 2022-04-11 DIAGNOSIS — Z1231 Encounter for screening mammogram for malignant neoplasm of breast: Secondary | ICD-10-CM | POA: Diagnosis not present

## 2022-04-12 DIAGNOSIS — J3081 Allergic rhinitis due to animal (cat) (dog) hair and dander: Secondary | ICD-10-CM | POA: Diagnosis not present

## 2022-04-12 DIAGNOSIS — J3089 Other allergic rhinitis: Secondary | ICD-10-CM | POA: Diagnosis not present

## 2022-04-12 DIAGNOSIS — L308 Other specified dermatitis: Secondary | ICD-10-CM | POA: Diagnosis not present

## 2022-04-12 DIAGNOSIS — J301 Allergic rhinitis due to pollen: Secondary | ICD-10-CM | POA: Diagnosis not present

## 2022-04-15 ENCOUNTER — Ambulatory Visit (INDEPENDENT_AMBULATORY_CARE_PROVIDER_SITE_OTHER): Payer: PPO

## 2022-04-15 VITALS — Ht 62.0 in | Wt 165.0 lb

## 2022-04-15 DIAGNOSIS — Z0000 Encounter for general adult medical examination without abnormal findings: Secondary | ICD-10-CM

## 2022-04-15 NOTE — Patient Instructions (Signed)
Brianna Mccarty , Thank you for taking time to come for your Medicare Wellness Visit. I appreciate your ongoing commitment to your health goals. Please review the following plan we discussed and let me know if I can assist you in the future.   Screening recommendations/referrals: Colonoscopy: not required Mammogram: completed 04/2022 Bone Density: completed 05/17/2020 Recommended yearly ophthalmology/optometry visit for glaucoma screening and checkup Recommended yearly dental visit for hygiene and checkup  Vaccinations: Influenza vaccine: due 06/04/2022 Pneumococcal vaccine: completed 09/01/2019 Tdap vaccine: completed 10/24/2015, due 10/23/2025 Shingles vaccine: completed    Covid-19:09/24/2021, 03/22/2020, 12/08/2019, 11/20/2019  Advanced directives: copy in chart  Conditions/risks identified: none  Next appointment: Follow up in one year for your annual wellness visit    Preventive Care 37 Years and Older, Female Preventive care refers to lifestyle choices and visits with your health care provider that can promote health and wellness. What does preventive care include? A yearly physical exam. This is also called an annual well check. Dental exams once or twice a year. Routine eye exams. Ask your health care provider how often you should have your eyes checked. Personal lifestyle choices, including: Daily care of your teeth and gums. Regular physical activity. Eating a healthy diet. Avoiding tobacco and drug use. Limiting alcohol use. Practicing safe sex. Taking low-dose aspirin every day. Taking vitamin and mineral supplements as recommended by your health care provider. What happens during an annual well check? The services and screenings done by your health care provider during your annual well check will depend on your age, overall health, lifestyle risk factors, and family history of disease. Counseling  Your health care provider may ask you questions about your: Alcohol  use. Tobacco use. Drug use. Emotional well-being. Home and relationship well-being. Sexual activity. Eating habits. History of falls. Memory and ability to understand (cognition). Work and work Statistician. Reproductive health. Screening  You may have the following tests or measurements: Height, weight, and BMI. Blood pressure. Lipid and cholesterol levels. These may be checked every 5 years, or more frequently if you are over 72 years old. Skin check. Lung cancer screening. You may have this screening every year starting at age 22 if you have a 30-pack-year history of smoking and currently smoke or have quit within the past 15 years. Fecal occult blood test (FOBT) of the stool. You may have this test every year starting at age 48. Flexible sigmoidoscopy or colonoscopy. You may have a sigmoidoscopy every 5 years or a colonoscopy every 10 years starting at age 67. Hepatitis C blood test. Hepatitis B blood test. Sexually transmitted disease (STD) testing. Diabetes screening. This is done by checking your blood sugar (glucose) after you have not eaten for a while (fasting). You may have this done every 1-3 years. Bone density scan. This is done to screen for osteoporosis. You may have this done starting at age 68. Mammogram. This may be done every 1-2 years. Talk to your health care provider about how often you should have regular mammograms. Talk with your health care provider about your test results, treatment options, and if necessary, the need for more tests. Vaccines  Your health care provider may recommend certain vaccines, such as: Influenza vaccine. This is recommended every year. Tetanus, diphtheria, and acellular pertussis (Tdap, Td) vaccine. You may need a Td booster every 10 years. Zoster vaccine. You may need this after age 64. Pneumococcal 13-valent conjugate (PCV13) vaccine. One dose is recommended after age 69. Pneumococcal polysaccharide (PPSV23) vaccine. One dose is  recommended  after age 73. Talk to your health care provider about which screenings and vaccines you need and how often you need them. This information is not intended to replace advice given to you by your health care provider. Make sure you discuss any questions you have with your health care provider. Document Released: 11/17/2015 Document Revised: 07/10/2016 Document Reviewed: 08/22/2015 Elsevier Interactive Patient Education  2017 [place] Prevention in the Home Falls can cause injuries. They can happen to people of all ages. There are many things you can do to make your home safe and to help prevent falls. What can I do on the outside of my home? Regularly fix the edges of walkways and driveways and fix any cracks. Remove anything that might make you trip as you walk through a door, such as a raised step or threshold. Trim any bushes or trees on the path to your home. Use bright outdoor lighting. Clear any walking paths of anything that might make someone trip, such as rocks or tools. Regularly check to see if handrails are loose or broken. Make sure that both sides of any steps have handrails. Any raised decks and porches should have guardrails on the edges. Have any leaves, snow, or ice cleared regularly. Use sand or salt on walking paths during winter. Clean up any spills in your garage right away. This includes oil or grease spills. What can I do in the bathroom? Use night lights. Install grab bars by the toilet and in the tub and shower. Do not use towel bars as grab bars. Use non-skid mats or decals in the tub or shower. If you need to sit down in the shower, use a plastic, non-slip stool. Keep the floor dry. Clean up any water that spills on the floor as soon as it happens. Remove soap buildup in the tub or shower regularly. Attach bath mats securely with double-sided non-slip rug tape. Do not have throw rugs and other things on the floor that can make you  trip. What can I do in the bedroom? Use night lights. Make sure that you have a light by your bed that is easy to reach. Do not use any sheets or blankets that are too big for your bed. They should not hang down onto the floor. Have a firm chair that has side arms. You can use this for support while you get dressed. Do not have throw rugs and other things on the floor that can make you trip. What can I do in the kitchen? Clean up any spills right away. Avoid walking on wet floors. Keep items that you use a lot in easy-to-reach places. If you need to reach something above you, use a strong step stool that has a grab bar. Keep electrical cords out of the way. Do not use floor polish or wax that makes floors slippery. If you must use wax, use non-skid floor wax. Do not have throw rugs and other things on the floor that can make you trip. What can I do with my stairs? Do not leave any items on the stairs. Make sure that there are handrails on both sides of the stairs and use them. Fix handrails that are broken or loose. Make sure that handrails are as long as the stairways. Check any carpeting to make sure that it is firmly attached to the stairs. Fix any carpet that is loose or worn. Avoid having throw rugs at the top or bottom of the stairs. If you do  have throw rugs, attach them to the floor with carpet tape. Make sure that you have a light switch at the top of the stairs and the bottom of the stairs. If you do not have them, ask someone to add them for you. What else can I do to help prevent falls? Wear shoes that: Do not have high heels. Have rubber bottoms. Are comfortable and fit you well. Are closed at the toe. Do not wear sandals. If you use a stepladder: Make sure that it is fully opened. Do not climb a closed stepladder. Make sure that both sides of the stepladder are locked into place. Ask someone to hold it for you, if possible. Clearly mark and make sure that you can  see: Any grab bars or handrails. First and last steps. Where the edge of each step is. Use tools that help you move around (mobility aids) if they are needed. These include: Canes. Walkers. Scooters. Crutches. Turn on the lights when you go into a dark area. Replace any light bulbs as soon as they burn out. Set up your furniture so you have a clear path. Avoid moving your furniture around. If any of your floors are uneven, fix them. If there are any pets around you, be aware of where they are. Review your medicines with your doctor. Some medicines can make you feel dizzy. This can increase your chance of falling. Ask your doctor what other things that you can do to help prevent falls. This information is not intended to replace advice given to you by your health care provider. Make sure you discuss any questions you have with your health care provider. Document Released: 08/17/2009 Document Revised: 03/28/2016 Document Reviewed: 11/25/2014 Elsevier Interactive Patient Education  2017 [employer].

## 2022-04-15 NOTE — Progress Notes (Signed)
I connected with Brianna Mccarty today by telephone and verified that I am speaking with the correct person using two identifiers. Location patient: home Location provider: work Persons participating in the virtual visit: Ameenah Prosser, Glenna Durand LPN.   I discussed the limitations, risks, security and privacy concerns of performing an evaluation and management service by telephone and the availability of in person appointments. I also discussed with the patient that there may be a patient responsible charge related to this service. The patient expressed understanding and verbally consented to this telephonic visit.    Interactive audio and video telecommunications were attempted between this provider and patient, however failed, due to patient having technical difficulties OR patient did not have access to video capability.  We continued and completed visit with audio only.     Vital signs may be patient reported or missing.  Subjective:   Brianna Mccarty is a 81 y.o. female who presents for Medicare Annual (Subsequent) preventive examination.  Review of Systems     Cardiac Risk Factors include: advanced age (>71mn, >>47women);hypertension;obesity (BMI >30kg/m2)     Objective:    Today's Vitals   04/15/22 1357  Weight: 165 lb (74.8 kg)  Height: '5\' 2"'$  (1.575 m)   Body mass index is 30.18 kg/m.     04/15/2022    2:03 PM 08/01/2021   10:49 AM 07/18/2021    1:29 PM 03/27/2021    1:16 PM 12/22/2019    6:56 AM 12/13/2019   10:51 AM 12/08/2018    2:03 PM  Advanced Directives  Does Patient Have a Medical Advance Directive? Yes Yes Yes Yes Yes Yes Yes  Type of AParamedicof [place]Living will [place]Living will H[place]Living will Healthcare Power of [place]onLiving will  Does patient want to make changes to medical advance directive?  No - Patient declined   No - Patient declined No -  Patient declined No - Patient declined  Copy of HLaddoniain Chart? Yes - validated most recent copy scanned in chart (See row information) Yes - validated most recent copy scanned in chart (See row information)  Yes - validated most recent copy scanned in chart (See row information)   No - copy requested    Current Medications (verified) Outpatient Encounter Medications as of 04/15/2022  Medication Sig   acyclovir (ZOVIRAX) 400 MG tablet Take 1 tablet (400 mg total) by mouth 5 (five) times daily as needed (fever blisters).   albuterol (PROAIR HFA) 108 (90 Base) MCG/ACT inhaler Inhale 2 puffs into the lungs every 6 (six) hours as needed for wheezing.   albuterol (PROVENTIL) (2.5 MG/3ML) 0.083% nebulizer solution Take 3 mLs (2.5 mg total) by nebulization every 6 (six) hours as needed.   alendronate (FOSAMAX) 70 MG tablet TAKE 1 TABLET EVERY 7 (SEVEN) DAYS. TAKE WITH A FULL GLASS OF WATER ON AN EMPTY STOMACH.   amLODipine (NORVASC) 10 MG tablet Take 1 tablet (10 mg total) by mouth daily.   azelastine (ASTELIN) 0.1 % nasal spray Instill 2 sprays into each nostril two times a day as needed for allergies   Bempedoic Acid (NEXLETOL) 180 MG TABS Take 1 tablet by mouth daily.   benzonatate (TESSALON) 100 MG capsule Take 1-2 capsules (100-200 mg total) by mouth 3 (three) times daily as needed for cough.   betamethasone dipropionate 0.05 % cream Apply 1 application topically 2 (two) times daily as needed (scaly skin).  budesonide-formoterol (SYMBICORT) 160-4.5 MCG/ACT inhaler Inhale 2 puffs into the lungs 2 (two) times daily.   Cholecalciferol 125 MCG (5000 UT) TABS Take 1 tablet by mouth daily.   desmopressin (DDAVP) 0.2 MG tablet Take 0.2 mg by mouth at bedtime.   EPINEPHrine 0.3 mg/0.3 mL IJ SOAJ injection Inject 0.3 mg into the muscle as needed for anaphylaxis.   fexofenadine (ALLEGRA) 180 MG tablet Take 180 mg by mouth daily.   furosemide (LASIX) 20 MG tablet Take 1 tablet (20 mg  total) by mouth daily as needed for edema.   hydrALAZINE (APRESOLINE) 50 MG tablet TAKE 1 TAB 3 TIMES A DAY AND TAKE EXTRA TAB IF SBP>160   hydrOXYzine (ATARAX/VISTARIL) 10 MG tablet Take 10 mg by mouth at bedtime.    losartan (COZAAR) 100 MG tablet TAKE 1 TABLET BY MOUTH EVERY DAY   METAMUCIL FIBER PO Take 1 capsule by mouth once a week.   metoprolol tartrate (LOPRESSOR) 25 MG tablet Take 12.5 mg by mouth daily. Take half a tablet daily   montelukast (SINGULAIR) 10 MG tablet Take 10 mg by mouth at bedtime.   omeprazole (PRILOSEC) 40 MG capsule TAKE 1 CAPSULE BY MOUTH EVERY DAY   pentosan polysulfate (ELMIRON) 100 MG capsule Take 100 mg by mouth daily.   polyethylene glycol (MIRALAX / GLYCOLAX) 17 g packet Take 17 g by mouth daily as needed for moderate constipation.   prazosin (MINIPRESS) 5 MG capsule *NEED OFFICE VISIT* TAKE 1 CAPSULE BY MOUTH TWICE A DAY   Probiotic Product (PROBIOTIC PO) Take 1 capsule by mouth daily.    promethazine (PHENERGAN) 12.5 MG tablet Take 1 tablet (12.5 mg total) by mouth every 6 (six) hours as needed for nausea or vomiting.   triamcinolone acetonide (TRIESENCE) 40 MG/ML SUSP Once per year (Patient not taking: Reported on 04/15/2022)   No facility-administered encounter medications on file as of 04/15/2022.    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 mild by carotid duplex.   Cervical dysplasia 1971   Coronary artery disease    Diverticulosis of colon    DJD (degenerative joint disease)    Family history of cancer of extrahepatic bile ducts 09/05/2020   Family history of  colon cancer 09/05/2020   Family history of pancreatic cancer 09/05/2020   GERD (gastroesophageal reflux disease)    Headache(784.0)    History of kidney stones    Hypercholesterolemia    Hypertension    Interstitial cystitis    sees urologist   Lichen sclerosus    Malignant melanoma ([place]) 2006   sees Dr. Nevada Crane in dermatology   Migraines    Mild aortic stenosis    Mitral valve disease    Question mitral valve prolapse in the past, no prolapse by echo 2009   Murmur 10/20/2015   SEES DR NELSON   OSA (obstructive sleep apnea)    USES DENTAL DEVICE   Osteoporosis    on fosomax > 5 years, stopped 11/2015   Pneumonia    SVT (supraventricular tachycardia) ([place]) 02/15/2022   Monitor 02/2022: NSR, avg HR 61; 2 runs of NSVT (4 beats);  several runs of Supraventricular Tachycardia (longest 3"11'); no AFib   Thyroid cyst    1 x 1.1 thyroid cyst noted on carotid Doppler, January, 2012   Venous insufficiency    Past Surgical History:  Procedure Laterality Date   BREAST BIOPSY Left 11/25/2011   U/S core, benign performed at [facility], LEFT BREAT MARKER IN   BREAST CYST ASPIRATION     BREAST SURGERY  2013   Breast Bx-Benign   CARDIAC CATHETERIZATION     CATARACT EXTRACTION, BILATERAL     CHOLECYSTECTOMY N/A 12/22/2019   Procedure: LAPAROSCOPIC CHOLECYSTECTOMY;  Surgeon: Ileana Roup, MD;  Location: [place];  Service: General;  Laterality: N/A;   cystoscopy and basket stone removal right ureter  02/2006   Dr. Robbi Garter SURGERY  12/22/2019   Ali Chuk   left total hip replacement  2004   Dr. Gladstone Lighter   melanoma removed from medial rleft knee area  2006   Dr. Nevada Crane   NASAL SEPTUM SURGERY     TOTAL HIP ARTHROPLASTY Right 08/01/2021   Procedure: [place];  Surgeon: Gaynelle Arabian, MD;  Location: [facility];  Service: Orthopedics;  Laterality: Right;   Family History  Problem Relation Age of Onset   Pancreatic  cancer Father 86   Diabetes Mother    Heart disease Mother    Cancer Brother 94       Bile duct   Diabetes Brother    Hypertension Brother    Diabetes Brother    Heart disease Brother    Hypertension Brother    Hypertension Sister    Hypertension Sister    Colon cancer Paternal Uncle        dx 79s   Lung cancer Paternal Uncle        dx 37s   Social History   Socioeconomic History   Marital status: Married    Spouse name: Edison Nasuti   Number of children: 2   Years of education: Not on file   Highest education level: Not on file  Occupational History   Occupation: retired    Fish farm manager: RETIRED  Tobacco Use   Smoking status: Never   Smokeless tobacco: Never  Vaping Use   Vaping Use: Never used  Substance and Sexual Activity   Alcohol use: Yes    Alcohol/week: 1.0 standard drink of alcohol    Types: 1 Standard drinks or equivalent per week    Comment:  OCC WINE   Drug use: No   Sexual activity: Not Currently    Birth control/protection: Post-menopausal    Comment: 1st intercourse 73 yo-1 partner  Other Topics Concern   Not on file  Social History Narrative   Updated 12/13/2019   Work or School: none      Home Situation: lives with husband      Spiritual Beliefs: christian      Lifestyle: regular exercise; healthy diet      12/13/2019: Uses treadmill 3/x weekly.    Looks after sister who has been having memory decline, as well  as husband with minor memory decline.    Enjoys writing poetry, reading, going to church, travel   Social Determinants of Health   Financial Resource Strain: Low Risk  (04/15/2022)   Overall Financial Resource Strain (CARDIA)    Difficulty of Paying Living Expenses: Not hard at all  Food Insecurity: No Food Insecurity (04/15/2022)   Hunger Vital Sign    Worried About Running Out of  Food in the Last Year: Never true    [place] in the Last Year: Never true  Transportation Needs: No Transportation Needs (04/15/2022)   PRAPARE -  Hydrologist (Medical): No    Lack of Transportation (Non-Medical): No  Physical Activity: Inactive (04/15/2022)   Exercise Vital Sign    Days of Exercise per Week: 0 days    Minutes of Exercise per Session: 0 min  Stress: No Stress Concern Present (04/15/2022)   [place]    Feeling of Stress : Not at all  Social Connections: Moderately Integrated (03/27/2021)   Social Connection and Isolation Panel [NHANES]    Frequency of Communication with Friends and Family: More than three times a week    Frequency of Social Gatherings with Friends and Family: More than three times a week    Attends Religious Services: More than 4 times per year    Active Member of [employer] or Organizations: No    Attends Music therapist: Never    Marital Status: Married    Tobacco Counseling Counseling given: Not Answered   Clinical Intake:  Pre-visit preparation completed: Yes  Pain : No/denies pain     Nutritional Status: BMI > 30  Obese Nutritional Risks: None Diabetes: No  How often do you need to have someone help you when you read instructions, pamphlets, or other written materials from your doctor or pharmacy?: 1 - Never What is the last grade level you completed in school?: 83yrcollege  Diabetic? no  Interpreter Needed?: No  Information entered by :: NAllen LPN   Activities of Daily Living    04/15/2022    2:04 PM 08/01/2021    5:37 PM  In your present state of health, do you have any difficulty performing the following activities:  Hearing? 1 0  Comment decreased hearing in the left   Vision? 0 0  Difficulty concentrating or making decisions? 0 0  Walking or climbing stairs? 0 1  Dressing or bathing? 0 0  Doing errands, shopping? 0 1  Preparing Food and eating ? N   Using the Toilet? N   In the past six months, have you accidently leaked urine? Y   Comment has incontinence    Do you have problems with loss of bowel control? N   Managing your Medications? N   Managing your Finances? N   Housekeeping or managing your Housekeeping? N     Patient Care Team: WKennyth Arnold FNP as PCP - General (Family Medicine) PFreada Bergeron MD as PCP - Cardiology (Cardiology) NDorothy Spark MD as Consulting Physician (Cardiology) SLadene Artist MD as Consulting Physician (Gastroenterology) EFestus Aloe MD as Consulting Physician (Urology) VHarold Hedge RDarrick Grinder MD as Consulting Physician (Allergy and Immunology) HMonna Fam MD as Consulting Physician (Ophthalmology) HAllyn Kenner MD (Dermatology) GLatanya Maudlin MD as Consulting Physician (Orthopedic Surgery) PViona Gilmore [facility]Bl[facility] Centeras Pharmacist (Pharmacist)  Indicate any recent Medical Services you may have received from other than [facility] providers in the past year (date may be approximate).     Assessment:   This is a routine wellness examination for FTresea  Hearing/Vision screen Vision Screening - Comments:: Regular eye exams, HHillery Hunter Dietary issues and exercise activities discussed: Current Exercise Habits: The patient does not participate in regular exercise at present   Goals Addressed  This Visit's Progress    Patient Stated       04/15/2022, wants to lose weight and exercise more and eat healthy       Depression Screen    04/15/2022    2:04 PM 03/20/2022    2:40 PM 02/26/2022    4:45 PM 03/27/2021    1:14 PM 12/13/2019   10:50 AM 12/09/2018    8:32 AM 10/22/2017    4:53 PM  PHQ 2/9 Scores  PHQ - 2 Score 0 3 0 0 0 0 0  PHQ- 9 Score  6 2   0     Fall Risk    04/15/2022    2:04 PM 02/26/2022    4:45 PM 03/27/2021    1:18 PM 07/24/2020    3:27 PM 12/13/2019   10:53 AM  Fall Risk   Falls in the past year? 0 0 1 0 0  Number falls in past yr: 0 0 1 0   Injury with Fall? 0 0 0 0   Risk for fall due to : Medication side effect No Fall Risks Impaired vision   Medication side effect  Follow up Falls evaluation completed;Education provided;Falls prevention discussed Falls evaluation completed Falls prevention discussed  Falls evaluation completed;Education provided;Falls prevention discussed    FALL RISK PREVENTION PERTAINING TO THE HOME:  Any stairs in or around the home? Yes  If so, are there any without handrails? No  Home free of loose throw rugs in walkways, pet beds, electrical cords, etc? Yes  Adequate lighting in your home to reduce risk of falls? Yes   ASSISTIVE DEVICES UTILIZED TO PREVENT FALLS:  Life alert? No  Use of a cane, walker or w/c? No  Grab bars in the bathroom? Yes  Shower chair or bench in shower? Yes  Elevated toilet seat or a handicapped toilet? Yes   TIMED UP AND GO:  Was the test performed? No .      Cognitive Function:        04/15/2022    2:08 PM 04/15/2022    2:06 PM 03/27/2021    1:24 PM 12/13/2019   10:55 AM  6CIT Screen  What Year? 0 points 0 points 0 points 0 points  What month? 0 points 0 points 0 points 0 points  What time? 0 points 0 points 0 points 0 points  Count back from 20 0 points 0 points 0 points 0 points  Months in reverse 0 points 0 points 0 points 0 points  Repeat phrase 0 points 0 points 0 points 0 points  Total Score 0 points 0 points 0 points 0 points    Immunizations Immunization History  Administered Date(s) Administered   Fluad Quad(high Dose 65+) 07/02/2019   Influenza Split 09/18/2011, 08/11/2012, 07/28/2014   Influenza Whole 07/21/2008, 08/04/2009, 08/01/2010   Influenza, High Dose Seasonal PF 07/19/2013, 07/28/2015, 10/17/2016, 08/11/2017, 09/01/2017, 08/04/2018, 09/11/2018, 09/01/2019, 06/30/2021   Influenza,inj,Quad PF,6+ Mos 07/03/2016   Moderna Covid-19 Vaccine Bivalent Booster 48yr & up 09/24/2021   PFIZER(Purple Top)SARS-COV-2 Vaccination 11/20/2019, 12/08/2019, 03/22/2020   Pneumococcal Conjugate-13 12/06/2013   Pneumococcal Polysaccharide-23 10/03/2008,  10/17/2016, 11/24/2017, 09/11/2018, 07/12/2019, 09/01/2019   Tdap 11/04/2008, 10/24/2015   Zoster Recombinat (Shingrix) 07/30/2019, 09/03/2019, 02/21/2020   Zoster, Live 11/05/2003    TDAP status: Up to date  Flu Vaccine status: Up to date  Pneumococcal vaccine status: Up to date  Covid-19 vaccine status: Completed vaccines  Qualifies for Shingles Vaccine? Yes   Zostavax completed Yes   Shingrix Completed?:  Yes  Screening Tests Health Maintenance  Topic Date Due   MAMMOGRAM  02/14/2022   INFLUENZA VACCINE  06/04/2022   TETANUS/TDAP  10/23/2025   Pneumonia Vaccine 27+ Years old  Completed   DEXA SCAN  Completed   COVID-19 Vaccine  Completed   Zoster Vaccines- Shingrix  Completed   HPV VACCINES  Aged Out   COLONOSCOPY (Pts 45-51yr Insurance coverage will need to be confirmed)  Discontinued    Health Maintenance  Health Maintenance Due  Topic Date Due   MAMMOGRAM  02/14/2022    Colorectal cancer screening: No longer required.   Mammogram status: Completed 04/2022. Repeat every year  Bone Density status: Completed 05/17/2020.   Lung Cancer Screening: (Low Dose CT Chest recommended if Age 82-80years, 30 pack-year currently smoking OR have quit w/in 15years.) does not qualify.   Lung Cancer Screening Referral: no  Additional Screening:  Hepatitis C Screening: does not qualify;   Vision Screening: Recommended annual ophthalmology exams for early detection of glaucoma and other disorders of the eye. Is the patient up to date with their annual eye exam?  Yes  Who is the provider or what is the name of the office in which the patient attends annual eye exams? HHillery HunterIf pt is not established with a provider, would they like to be referred to a provider to establish care? No .   Dental Screening: Recommended annual dental exams for proper oral hygiene  Community Resource Referral / Chronic Care Management: CRR required this visit?  No   CCM required this visit?   No      Plan:     I have personally reviewed and noted the following in the patient's chart:   Medical and social history Use of alcohol, tobacco or illicit drugs  Current medications and supplements including opioid prescriptions.  Functional ability and status Nutritional status Physical activity Advanced directives List of other physicians Hospitalizations, surgeries, and ER visits in previous 12 months Vitals Screenings to include cognitive, depression, and falls Referrals and appointments  In addition, I have reviewed and discussed with patient certain preventive protocols, quality metrics, and best practice recommendations. A written personalized care plan for preventive services as well as general preventive health recommendations were provided to patient.     NKellie Simmering LPN   [id number]  Nurse Notes: none  Due to this being a virtual visit, the after visit summary with patients personalized plan was offered to patient via mail or my-chart.  patient was mailed a copy of AVS.

## 2022-04-16 ENCOUNTER — Encounter: Payer: Self-pay | Admitting: Nurse Practitioner

## 2022-04-17 ENCOUNTER — Other Ambulatory Visit: Payer: PPO

## 2022-04-17 DIAGNOSIS — J301 Allergic rhinitis due to pollen: Secondary | ICD-10-CM | POA: Diagnosis not present

## 2022-04-17 DIAGNOSIS — J3089 Other allergic rhinitis: Secondary | ICD-10-CM | POA: Diagnosis not present

## 2022-04-17 DIAGNOSIS — J3081 Allergic rhinitis due to animal (cat) (dog) hair and dander: Secondary | ICD-10-CM | POA: Diagnosis not present

## 2022-04-24 ENCOUNTER — Ambulatory Visit (INDEPENDENT_AMBULATORY_CARE_PROVIDER_SITE_OTHER)
Admission: RE | Admit: 2022-04-24 | Discharge: 2022-04-24 | Disposition: A | Discharge status: Home / Self Care | Payer: PPO | Source: Ambulatory Visit | Attending: Cardiology | Admitting: Cardiology

## 2022-04-24 ENCOUNTER — Other Ambulatory Visit: Payer: PPO

## 2022-04-24 ENCOUNTER — Other Ambulatory Visit: Payer: Self-pay | Admitting: *Deleted

## 2022-04-24 DIAGNOSIS — R002 Palpitations: Secondary | ICD-10-CM

## 2022-04-24 DIAGNOSIS — Z0189 Encounter for other specified special examinations: Secondary | ICD-10-CM

## 2022-04-24 DIAGNOSIS — E7800 Pure hypercholesterolemia, unspecified: Secondary | ICD-10-CM | POA: Diagnosis not present

## 2022-04-24 DIAGNOSIS — I77810 Thoracic aortic ectasia: Secondary | ICD-10-CM | POA: Diagnosis not present

## 2022-04-24 DIAGNOSIS — I77819 Aortic ectasia, unspecified site: Secondary | ICD-10-CM

## 2022-04-24 DIAGNOSIS — I10 Essential (primary) hypertension: Secondary | ICD-10-CM

## 2022-04-24 DIAGNOSIS — J3081 Allergic rhinitis due to animal (cat) (dog) hair and dander: Secondary | ICD-10-CM | POA: Diagnosis not present

## 2022-04-24 DIAGNOSIS — R918 Other nonspecific abnormal finding of lung field: Secondary | ICD-10-CM | POA: Diagnosis not present

## 2022-04-24 DIAGNOSIS — J3089 Other allergic rhinitis: Secondary | ICD-10-CM | POA: Diagnosis not present

## 2022-04-24 DIAGNOSIS — I517 Cardiomegaly: Secondary | ICD-10-CM | POA: Diagnosis not present

## 2022-04-24 DIAGNOSIS — I700 Atherosclerosis of aorta: Secondary | ICD-10-CM | POA: Diagnosis not present

## 2022-04-24 DIAGNOSIS — I350 Nonrheumatic aortic (valve) stenosis: Secondary | ICD-10-CM | POA: Diagnosis not present

## 2022-04-24 DIAGNOSIS — J301 Allergic rhinitis due to pollen: Secondary | ICD-10-CM | POA: Diagnosis not present

## 2022-04-24 LAB — COMPREHENSIVE METABOLIC PANEL
ALT: 9 IU/L (ref 0–32)
AST: 14 IU/L (ref 0–40)
Albumin/Globulin Ratio: 2.6 — ABNORMAL HIGH (ref 1.2–2.2)
Albumin: 4.1 g/dL (ref 3.6–4.6)
Alkaline Phosphatase: 39 IU/L — ABNORMAL LOW (ref 44–121)
BUN/Creatinine Ratio: 19 (ref 12–28)
BUN: 16 mg/dL (ref 8–27)
Bilirubin Total: 0.3 mg/dL (ref 0.0–1.2)
CO2: 25 mmol/L (ref 20–29)
Calcium: 9.2 mg/dL (ref 8.7–10.3)
Chloride: 105 mmol/L (ref 96–106)
Creatinine, Ser: 0.84 mg/dL (ref 0.57–1.00)
Globulin, Total: 1.6 g/dL (ref 1.5–4.5)
Glucose: 94 mg/dL (ref 70–99)
Potassium: 4.5 mmol/L (ref 3.5–5.2)
Sodium: 140 mmol/L (ref 134–144)
Total Protein: 5.7 g/dL — ABNORMAL LOW (ref 6.0–8.5)
eGFR: 69 mL/min/{1.73_m2} (ref >59)

## 2022-04-24 LAB — LIPID PANEL
Chol/HDL Ratio: 2.4 ratio (ref 0.0–4.4)
Cholesterol, Total: 202 mg/dL — ABNORMAL HIGH (ref 100–199)
HDL: 83 mg/dL (ref >39)
LDL Chol Calc (NIH): 113 mg/dL — ABNORMAL HIGH (ref 0–99)
Triglycerides: 31 mg/dL (ref 0–149)
VLDL Cholesterol Cal: 6 mg/dL (ref 5–40)

## 2022-04-24 MED ORDER — IOHEXOL 350 MG/ML SOLN
75.0000 mL | Freq: Once | INTRAVENOUS | Status: AC | PRN
  Administered 2022-04-24: 75 mL via INTRAVENOUS

## 2022-04-25 ENCOUNTER — Telehealth: Payer: Self-pay | Admitting: Cardiology

## 2022-04-25 ENCOUNTER — Other Ambulatory Visit: Payer: Self-pay | Admitting: *Deleted

## 2022-04-25 DIAGNOSIS — E7800 Pure hypercholesterolemia, unspecified: Secondary | ICD-10-CM

## 2022-04-25 MED ORDER — EZETIMIBE 10 MG PO TABS: 10.0000 mg | ORAL_TABLET | Freq: Every day | ORAL | 3 refills | Status: DC

## 2022-04-25 NOTE — Telephone Encounter (Signed)
The patient has been notified of the result and verbalized understanding.  All questions (if any) were answered. Nuala Alpha, LPN 6/80/3212 24:82 AM

## 2022-04-25 NOTE — Telephone Encounter (Signed)
Patient is returning phone call about lab results. Please call back

## 2022-04-25 NOTE — Telephone Encounter (Signed)
-----   Message from Freada Bergeron, MD sent at 04/24/2022  7:58 PM EDT ----- CTA shows no significant dilation of her aorta. This is great news and means we do not need to monitor further.

## 2022-04-26 ENCOUNTER — Other Ambulatory Visit: Payer: PPO

## 2022-05-02 DIAGNOSIS — J3081 Allergic rhinitis due to animal (cat) (dog) hair and dander: Secondary | ICD-10-CM | POA: Diagnosis not present

## 2022-05-02 DIAGNOSIS — J3089 Other allergic rhinitis: Secondary | ICD-10-CM | POA: Diagnosis not present

## 2022-05-02 DIAGNOSIS — J301 Allergic rhinitis due to pollen: Secondary | ICD-10-CM | POA: Diagnosis not present

## 2022-05-08 ENCOUNTER — Telehealth: Payer: Self-pay | Admitting: Pharmacist

## 2022-05-08 NOTE — Progress Notes (Signed)
Chronic Care Management Pharmacy Assistant   Name: Brianna Mccarty  MRN: [id number] DOB: 10-May-1941  Reason for Encounter: Disease State   Conditions to be addressed/monitored: HTN  Recent office visits:  04/15/22 Brianna Simmering, LPN - Patient presented for Medicare Annual Wellness exam. Patient Voiced Goal of Loosing weight and regular exercise.  Recent consult visits:   05/17/22 - Brianna Maudlin, MD Patient presented to [facility] for Acute left shoulder subacromial bursitis and other concerns. Administered Corticosteroid injection. Prescribed Methocarbamol.  05/14/22 Brianna Mccarty Patient presented for Allergic Rhinitis and other concerns.  6/21//23 - Patient presented to [facility] for CT Angio Chest Aorta W/CM & OR  04/17/22 Brianna Mccarty, [name]  - Patient presented for Allergic rhinitis due to pollen and other concerns. No medication Changes.  04/12/2022 Brianna Mccarty, [name]  - Patient presented for Allergic rhinitis due to pollen and other concerns. No medication Changes.  04/06/2022 Patient presented to [facility] for MR ABD W/WO CM/MRCP-GI.  04/04/2022 Brianna Mccarty., NP (Cardiology) - Patient presented for Palpitations and other concerns. Stopped Fluconazole 150 mg.   04/03/2022 Brianna Mccarty - Patient presented for Allergic rhinitis due to pollen and other concerns. No medication changes.  Hospital visits:   Medication Reconciliation was completed by comparing discharge summary, patient's EMR and Pharmacy list, and upon discussion with patient.  Patient presented to [facility] ED on 05/17/2022 due to Acute pain of left shoulder. Patient was present for 1 hour.  New?Medications Started at [facility] Discharge:?? -started  oxyCODONE-acetaminophen 5-325 MG  Medication Changes at Hospital Discharge: -Changed  none  Medications Discontinued at Hospital Discharge: -Stopped  none  Medications that remain the same after Hospital  Discharge:??  -All other medications will remain the same.     Medication Reconciliation was completed by comparing discharge summary, patient's EMR and Pharmacy list, and upon discussion with patient.  Patient presented to [facility]gent care at [facility] on 03/03/2022 due to Cough. Patient was present for 1 hour.  New?Medications Started at [facility] Discharge:?? -started  benzonatate 100 MG capsule doxycycline 100 MG capsule fluconazole 150 MG tablet predniSONE 20 MG tablet promethazine-dextromethorphan 6.25-15 MG  Medication Changes at Hospital Discharge: -Changed  none  Medications Discontinued at Hospital Discharge: -Stopped  none  Medications that remain the same after Hospital Discharge:??  -All other medications will remain the same.    Medications: Outpatient Encounter Medications as of 05/08/2022  Medication Sig   acyclovir (ZOVIRAX) 400 MG tablet Take 1 tablet (400 mg total) by mouth 5 (five) times daily as needed (fever blisters).   albuterol (PROAIR HFA) 108 (90 Base) MCG/ACT inhaler Inhale 2 puffs into the lungs every 6 (six) hours as needed for wheezing.   albuterol (PROVENTIL) (2.5 MG/3ML) 0.083% nebulizer solution Take 3 mLs (2.5 mg total) by nebulization every 6 (six) hours as needed.   alendronate (FOSAMAX) 70 MG tablet TAKE 1 TABLET EVERY 7 (SEVEN) DAYS. TAKE WITH A FULL GLASS OF WATER ON AN EMPTY STOMACH.   amLODipine (NORVASC) 10 MG tablet Take 1 tablet (10 mg total) by mouth daily.   azelastine (ASTELIN) 0.1 % nasal spray Instill 2 sprays into each nostril two times a day as needed for allergies   Bempedoic Acid (NEXLETOL) 180 MG TABS Take 1 tablet by mouth daily.   benzonatate (TESSALON) 100 MG capsule Take 1-2 capsules (100-200 mg total) by mouth 3 (three) times daily as needed for cough.   betamethasone dipropionate 0.05 %  cream Apply 1 application topically 2 (two) times daily as needed (scaly skin).   budesonide-formoterol (SYMBICORT) 160-4.5  MCG/ACT inhaler Inhale 2 puffs into the lungs 2 (two) times daily.   Cholecalciferol 125 MCG (5000 UT) TABS Take 1 tablet by mouth daily.   desmopressin (DDAVP) 0.2 MG tablet Take 0.2 mg by mouth at bedtime.   EPINEPHrine 0.3 mg/0.3 mL IJ SOAJ injection Inject 0.3 mg into the muscle as needed for anaphylaxis.   ezetimibe (ZETIA) 10 MG tablet Take 1 tablet (10 mg total) by mouth daily.   fexofenadine (ALLEGRA) 180 MG tablet Take 180 mg by mouth daily.   furosemide (LASIX) 20 MG tablet Take 1 tablet (20 mg total) by mouth daily as needed for edema.   hydrALAZINE (APRESOLINE) 50 MG tablet TAKE 1 TAB 3 TIMES A DAY AND TAKE EXTRA TAB IF SBP>160   hydrOXYzine (ATARAX/VISTARIL) 10 MG tablet Take 10 mg by mouth at bedtime.    losartan (COZAAR) 100 MG tablet TAKE 1 TABLET BY MOUTH EVERY DAY   METAMUCIL FIBER PO Take 1 capsule by mouth once a week.   metoprolol tartrate (LOPRESSOR) 25 MG tablet Take 12.5 mg by mouth daily. Take half a tablet daily   montelukast (SINGULAIR) 10 MG tablet Take 10 mg by mouth at bedtime.   omeprazole (PRILOSEC) 40 MG capsule TAKE 1 CAPSULE BY MOUTH EVERY DAY   pentosan polysulfate (ELMIRON) 100 MG capsule Take 100 mg by mouth daily.   polyethylene glycol (MIRALAX / GLYCOLAX) 17 g packet Take 17 g by mouth daily as needed for moderate constipation.   prazosin (MINIPRESS) 5 MG capsule *NEED OFFICE VISIT* TAKE 1 CAPSULE BY MOUTH TWICE A DAY   Probiotic Product (PROBIOTIC PO) Take 1 capsule by mouth daily.    promethazine (PHENERGAN) 12.5 MG tablet Take 1 tablet (12.5 mg total) by mouth every 6 (six) hours as needed for nausea or vomiting.   triamcinolone acetonide (TRIESENCE) 40 MG/ML SUSP Once per year (Patient not taking: Reported on 04/15/2022)   No facility-administered encounter medications on file as of 05/08/2022.   Reviewed chart prior to disease state call. Spoke with patient regarding BP  Recent Office Vitals: BP Readings from Last 3 Encounters:  05/17/22 139/75   04/04/22 124/68  03/20/22 130/80   Pulse Readings from Last 3 Encounters:  05/17/22 85  04/04/22 (!) 57  03/20/22 60    Wt Readings from Last 3 Encounters:  05/17/22 165 lb (74.8 kg)  04/15/22 165 lb (74.8 kg)  04/04/22 169 lb (76.7 kg)     Kidney Function Lab Results  Component Value Date/Time   CREATININE 0.84 04/24/2022 07:24 AM   CREATININE 0.74 04/04/2022 04:17 PM   CREATININE 0.88 05/12/2020 02:48 PM   CREATININE 1.14 (H) 12/21/2015 12:42 PM   GFR 43.51 (L) 03/20/2022 03:32 PM   GFRNONAA >60 10/21/2021 08:51 AM   GFRAA 84 07/24/2020 03:56 PM       Latest Ref Rng & Units 04/24/2022    7:24 AM 04/04/2022    4:17 PM 03/20/2022    3:32 PM  BMP  Glucose 70 - 99 mg/dL 94  89  93   BUN 8 - 27 mg/dL 16  16  32   Creatinine 0.57 - 1.00 mg/dL 0.84  0.74  1.17   BUN/Creat Ratio 12 - '28 19  22    '$ Sodium 134 - 144 mmol/L 140  137  135   Potassium 3.5 - 5.2 mmol/L 4.5  4.0  4.6  Chloride 96 - 106 mmol/L 105  101  103   CO2 20 - 29 mmol/L '25  24  27   '$ Calcium 8.7 - 10.3 mg/dL 9.2  8.9  9.6     Current antihypertensive regimen:  Amlodipine 10 mg 1 tablet daily - Appropriate, Query effective, Safe, Accessible Hydralazine 50 mg 1 tablet three times daily - taking one in morning and one at night - Appropriate, Query effective, Safe, Accessible Losartan 100 mg 1 tablet daily  - Appropriate, Query effective, Safe, Accessible Prazosin 5 mg one in the morning and one at night - Appropriate, Query effective, Query Safe, Accessible How often are you checking your Blood Pressure? several times per month Current home BP readings:  BP Readings from Last 3 Encounters:  05/17/22 139/75  04/04/22 124/68  03/20/22 130/80   What recent interventions/DTPs have been made by any provider to improve Blood Pressure control since last CPP Visit: Patient reports none Any recent hospitalizations or ED visits since last visit with CPP? Yes Patient reports she is still having some neck and  shoulder pain after her ED visit and she did follow up with her provider who has been dealing with this for some time, she reports she was given some medication that she thinks will help. She was advised that she may call the office to schedule a visit with the provider that took over for her prior PCP when she is ready and she was in agreement. She reports no needs issues or concerns or questions following DC from ED at this time.  Adherence Review: Is the patient currently on ACE/ARB medication? Yes Does the patient have >5 day gap between last estimated fill dates? No     Care Gaps: BP- 124/68 04/04/2022 CCM- 11/23 AWV- 6/23  Star Rating Drugs: Losartan (Cozaar) 100 mg - Last filled 03/25/22 90 DS at [facility] Pharmacist Assistant [phone]

## 2022-05-14 DIAGNOSIS — J3089 Other allergic rhinitis: Secondary | ICD-10-CM | POA: Diagnosis not present

## 2022-05-14 DIAGNOSIS — J301 Allergic rhinitis due to pollen: Secondary | ICD-10-CM | POA: Diagnosis not present

## 2022-05-14 DIAGNOSIS — J4540 Moderate persistent asthma, uncomplicated: Secondary | ICD-10-CM | POA: Diagnosis not present

## 2022-05-14 DIAGNOSIS — K219 Gastro-esophageal reflux disease without esophagitis: Secondary | ICD-10-CM | POA: Diagnosis not present

## 2022-05-14 DIAGNOSIS — H1045 Other chronic allergic conjunctivitis: Secondary | ICD-10-CM | POA: Diagnosis not present

## 2022-05-16 DIAGNOSIS — M542 Cervicalgia: Secondary | ICD-10-CM | POA: Diagnosis not present

## 2022-05-16 DIAGNOSIS — M7552 Bursitis of left shoulder: Secondary | ICD-10-CM | POA: Diagnosis not present

## 2022-05-17 ENCOUNTER — Emergency Department (HOSPITAL_COMMUNITY)
Admission: EM | Admit: 2022-05-17 | Discharge: 2022-05-17 | Disposition: A | Discharge status: Home / Self Care | Payer: PPO | Attending: Emergency Medicine | Admitting: Emergency Medicine

## 2022-05-17 ENCOUNTER — Other Ambulatory Visit: Payer: Self-pay

## 2022-05-17 ENCOUNTER — Encounter (HOSPITAL_COMMUNITY): Payer: Self-pay

## 2022-05-17 DIAGNOSIS — Z85820 Personal history of malignant melanoma of skin: Secondary | ICD-10-CM | POA: Insufficient documentation

## 2022-05-17 DIAGNOSIS — M25512 Pain in left shoulder: Secondary | ICD-10-CM | POA: Insufficient documentation

## 2022-05-17 MED ORDER — OXYCODONE-ACETAMINOPHEN 5-325 MG PO TABS: 1.0000 | ORAL_TABLET | Freq: Four times a day (QID) | ORAL | 0 refills | Status: DC | PRN

## 2022-05-17 MED ORDER — OXYCODONE-ACETAMINOPHEN 5-325 MG PO TABS
1.0000 | ORAL_TABLET | Freq: Once | ORAL | Status: AC
  Administered 2022-05-17: 1 via ORAL
  Filled 2022-05-17: qty 1

## 2022-05-17 NOTE — ED Triage Notes (Signed)
Patient states she has arthritis pain in the posterior neck, upper back and left shoulder pain x 14 and worse in the past 4 days. Patient states that she has received Prednisone, a muscle relaxant, a "shot " and thinks it was a cortisone shot.  Patient states that she has had no relief from the pain.

## 2022-05-17 NOTE — ED Provider Notes (Signed)
[facility] DEPT Provider Note   CSN: [id number] Arrival date & time: 05/17/22  1546     History  Chief Complaint  Patient presents with   Shoulder Pain   Neck Pain    Brianna Mccarty is a 81 y.o. female with past medical history significant for known arthritis, remote history of melanoma treated with clear lymph node excision back in 2007 who presents with concern for severe left shoulder pain that has been ongoing for around 2 weeks but has been significantly worse for the last 4 days.  Patient reports that she is being seen and evaluated by orthopedics, began taking prednisone 2 days ago, and had a steroid shot in the left arm yesterday.  Patient reports that she is getting no relief with Aleve, Tylenol, muscle relaxant, steroid, steroid injection.  She reports that she normally does get relief with steroid injection.  Patient denies any weakness, numbness, tingling.  She denies any fever, chills, neck or back pain other than radiating from the left shoulder.   Shoulder Pain Neck Pain      Home Medications Prior to Admission medications   Medication Sig Start Date End Date Taking? Authorizing Provider  oxyCODONE-acetaminophen (PERCOCET/ROXICET) 5-325 MG tablet Take 1 tablet by mouth every 6 (six) hours as needed for severe pain. 05/17/22  Yes Domonik Levario H, PA-C  acyclovir (ZOVIRAX) 400 MG tablet Take 1 tablet (400 mg total) by mouth 5 (five) times daily as needed (fever blisters). 02/21/22   [name], NP  albuterol (PROAIR HFA) 108 (90 Base) MCG/ACT inhaler Inhale 2 puffs into the lungs every 6 (six) hours as needed for wheezing. 09/10/18   Noralee Space, MD  albuterol (PROVENTIL) (2.5 MG/3ML) 0.083% nebulizer solution Take 3 mLs (2.5 mg total) by nebulization every 6 (six) hours as needed. 09/10/18 07/09/29  Noralee Space, MD  alendronate (FOSAMAX) 70 MG tablet TAKE 1 TABLET EVERY 7 (SEVEN) DAYS. TAKE WITH A FULL GLASS OF WATER ON AN  EMPTY STOMACH. 05/06/21   Koberlein, Andris Flurry C, MD  amLODipine (NORVASC) 10 MG tablet Take 1 tablet (10 mg total) by mouth daily. 03/20/22   Caren Macadam, MD  azelastine (ASTELIN) 0.1 % nasal spray Instill 2 sprays into each nostril two times a day as needed for allergies 02/03/21   Scot Jun, FNP  Bempedoic Acid (NEXLETOL) 180 MG TABS Take 1 tablet by mouth daily. 04/08/22   Freada Bergeron, MD  benzonatate (TESSALON) 100 MG capsule Take 1-2 capsules (100-200 mg total) by mouth 3 (three) times daily as needed for cough. 03/03/22   Jaynee Eagles, PA-C  betamethasone dipropionate 0.05 % cream Apply 1 application topically 2 (two) times daily as needed (scaly skin). 03/29/21   [provider]  budesonide-formoterol (SYMBICORT) 160-4.5 MCG/ACT inhaler Inhale 2 puffs into the lungs 2 (two) times daily. 10/05/21   Caren Macadam, MD  Cholecalciferol 125 MCG (5000 UT) TABS Take 1 tablet by mouth daily.    [provider]  desmopressin (DDAVP) 0.2 MG tablet Take 0.2 mg by mouth at bedtime.    [provider]  EPINEPHrine 0.3 mg/0.3 mL IJ SOAJ injection Inject 0.3 mg into the muscle as needed for anaphylaxis. 12/21/18   [provider]  ezetimibe (ZETIA) 10 MG tablet Take 1 tablet (10 mg total) by mouth daily. 04/25/22   Richardson Dopp T, PA-C  fexofenadine (ALLEGRA) 180 MG tablet Take 180 mg by mouth daily.    [provider]  furosemide (LASIX) 20 MG tablet Take 1 tablet (20 mg total) by mouth daily as needed for edema. 12/13/21   Freada Bergeron, MD  hydrALAZINE (APRESOLINE) 50 MG tablet TAKE 1 TAB 3 TIMES A DAY AND TAKE EXTRA TAB IF SBP>160 07/16/21   Freada Bergeron, MD  hydrOXYzine (ATARAX/VISTARIL) 10 MG tablet Take 10 mg by mouth at bedtime.     [provider]  losartan (COZAAR) 100 MG tablet TAKE 1 TABLET BY MOUTH EVERY DAY 03/17/22   Koberlein, Steele Berg, MD  METAMUCIL FIBER PO Take 1 capsule by mouth once a week.    [provider]  metoprolol tartrate (LOPRESSOR) 25 MG tablet Take 12.5 mg by mouth daily. Take half a tablet daily    [provider]  montelukast (SINGULAIR) 10 MG tablet Take 10 mg by mouth at bedtime.    [provider]  omeprazole (PRILOSEC) 40 MG capsule TAKE 1 CAPSULE BY MOUTH EVERY DAY 07/13/21   Koberlein, Steele Berg, MD  pentosan polysulfate (ELMIRON) 100 MG capsule Take 100 mg by mouth daily.    [provider]  polyethylene glycol (MIRALAX / GLYCOLAX) 17 g packet Take 17 g by mouth daily as needed for moderate constipation.    [provider]  prazosin (MINIPRESS) 5 MG capsule *NEED OFFICE VISIT* TAKE 1 CAPSULE BY MOUTH TWICE A DAY 02/02/22   Koberlein, Steele Berg, MD  Probiotic Product (PROBIOTIC PO) Take 1 capsule by mouth daily.     [provider]  promethazine (PHENERGAN) 12.5 MG tablet Take 1 tablet (12.5 mg total) by mouth every 6 (six) hours as needed for nausea or vomiting. 03/22/22   Caren Macadam, MD  triamcinolone acetonide (TRIESENCE) 40 MG/ML SUSP Once per year Patient not taking: Reported on 04/15/2022    [provider]      Allergies    Celebrex [celecoxib], Cephalexin, Hydrochlorothiazide, Penicillins, Phenobarbital, Statins, Passion fruit flavor, and Tape    Review of Systems   Review of Systems  Musculoskeletal:  Positive for arthralgias.  All other systems reviewed and are negative.   Physical Exam Updated Vital Signs BP 139/75 (BP Location: Right Arm)   Pulse 85   Temp 97.9 F (36.6 C) (Oral)   Resp 20   Ht '5\' 2"'$  (1.575 m)   Wt 74.8 kg   SpO2 98%   BMI 30.18 kg/m  Physical Exam Vitals and nursing note reviewed.  Constitutional:      General: She is not in acute distress.    Appearance: Normal appearance.  HENT:     Head: Normocephalic and atraumatic.  Eyes:     General:        Right eye: No discharge.        Left eye: No discharge.  Cardiovascular:     Rate and Rhythm: Normal rate and  regular rhythm.     Pulses: Normal pulses.  Pulmonary:     Effort: Pulmonary effort is normal. No respiratory distress.  Musculoskeletal:        General: No deformity.     Comments: Intact range of motion to flexion, extension, abduction, abduction, internal and external rotation of the left shoulder.  Patient with significant pain with abduction, as well as internal and external rotation.  She does have some crepitus that is audible with shoulder motion.  Tenderness over the humeral head and left joint space.  Some paraspinous muscle tenderness of the cervical spine, no midline spinal tenderness throughout.  Intact strength 5  out of 5 of other extremities, both the right arm as well as bilateral lower extremities.  Patient can ambulate without difficulty.  Skin:    General: Skin is warm and dry.     Capillary Refill: Capillary refill takes less than 2 seconds.  Neurological:     Mental Status: She is alert and oriented to person, place, and time.  Psychiatric:        Mood and Affect: Mood normal.        Behavior: Behavior normal.     ED Results / Procedures / Treatments   Labs (all labs ordered are listed, but only abnormal results are displayed) Labs Reviewed - No data to display  EKG None  Radiology No results found.  Procedures Procedures    Medications Ordered in ED Medications  oxyCODONE-acetaminophen (PERCOCET/ROXICET) 5-325 MG per tablet 1 tablet (has no administration in time range)    ED Course/ Medical Decision Making/ A&P                           Medical Decision Making Risk Prescription drug management.   This is a well-appearing, pleasant 81 year old female with history of arthritis of left shoulder who presents with concern for severe ongoing pain despite significant measures with her orthopedic doctor.  Patient has been taking muscle relaxant, Aleve, Tylenol, she has been on prednisone for 2 days, and received a steroid injection directly into the joint  space yesterday.  She reports excruciating pain, which is much worse than previous incidences with shoulder.  On my exam she is neurovascularly intact and has pain which is consistent with arthritis related pain.  She denies any inciting incident, fall, reports that she thinks she exacerbated it pushing herself up and forward in her recliner chair.  She does have a remote history of melanoma, however she has no other bony tenderness, spinal tenderness, or worrisome neurologic symptoms.  She has a documented history of arthritis in the shoulder as well as a story consistent with exacerbation.  I have low clinical concern that this is a manifestation of malignant metastasis.  I have reviewed recent plain film radiographic imaging performed at her orthopedic doctor of the affected extremity which shows no evidence of new mass metastasis, does note arthritic changes.  Given patient's excruciating pain, no history of narcotic overuse, and appropriate use of other adjunctive medications without relief I think it is appropriate for very short course of narcotics to bridge patient until she is able to see her orthopedic doctor on Monday.  Encouraged continuing the rest of the medications that she is prescribed.  Additionally encouraged immobilization of the left shoulder in sling.  Patient understands and agrees to plan, discharged in stable condition at this time. Final Clinical Impression(s) / ED Diagnoses Final diagnoses:  Acute pain of left shoulder    Rx / DC Orders ED Discharge Orders          Ordered    oxyCODONE-acetaminophen (PERCOCET/ROXICET) 5-325 MG tablet  Every 6 hours PRN        05/17/22 1621              Obbie Lewallen, Joesph Fillers, PA-C 05/17/22 1627    Blanchie Dessert, MD 05/17/22 2339

## 2022-05-17 NOTE — Discharge Instructions (Signed)
Please use Tylenol or ibuprofen for pain.  You may use 600 mg ibuprofen every 6 hours or 1000 mg of Tylenol every 6 hours.  You may choose to alternate between the 2.  This would be most effective.  Not to exceed 4 g of Tylenol within 24 hours.  Not to exceed 3200 mg ibuprofen 24 hours.  Please continue to use the muscle relaxant that you have prescribed as well as continue taking your steroids.  If you have severe breakthrough pain that does not respond to all of the above you can take one of the narcotic pain medications I prescribed in place of the Tylenol above.  Please follow-up as planned with your orthopedic doctor on Monday.

## 2022-05-17 NOTE — Progress Notes (Signed)
Orthopedic Tech Progress Note Patient Details:  Brianna Mccarty 03/23/1941 [id number]  Ortho Devices Type of Ortho Device: Shoulder immobilizer Ortho Device/Splint Location: LUE Ortho Device/Splint Interventions: Application   Post Interventions Patient Tolerated: Well  Linus Salmons [name] 05/17/2022, 4:37 PM

## 2022-05-20 DIAGNOSIS — M542 Cervicalgia: Secondary | ICD-10-CM | POA: Diagnosis not present

## 2022-05-20 DIAGNOSIS — M25512 Pain in left shoulder: Secondary | ICD-10-CM | POA: Diagnosis not present

## 2022-05-23 DIAGNOSIS — M5412 Radiculopathy, cervical region: Secondary | ICD-10-CM | POA: Diagnosis not present

## 2022-05-27 DIAGNOSIS — J301 Allergic rhinitis due to pollen: Secondary | ICD-10-CM | POA: Diagnosis not present

## 2022-05-27 DIAGNOSIS — J3089 Other allergic rhinitis: Secondary | ICD-10-CM | POA: Diagnosis not present

## 2022-05-27 DIAGNOSIS — M542 Cervicalgia: Secondary | ICD-10-CM | POA: Diagnosis not present

## 2022-05-27 DIAGNOSIS — M25512 Pain in left shoulder: Secondary | ICD-10-CM | POA: Diagnosis not present

## 2022-05-27 DIAGNOSIS — J3081 Allergic rhinitis due to animal (cat) (dog) hair and dander: Secondary | ICD-10-CM | POA: Diagnosis not present

## 2022-06-03 DIAGNOSIS — J301 Allergic rhinitis due to pollen: Secondary | ICD-10-CM | POA: Diagnosis not present

## 2022-06-03 DIAGNOSIS — M25512 Pain in left shoulder: Secondary | ICD-10-CM | POA: Diagnosis not present

## 2022-06-03 DIAGNOSIS — J3089 Other allergic rhinitis: Secondary | ICD-10-CM | POA: Diagnosis not present

## 2022-06-07 DIAGNOSIS — M25512 Pain in left shoulder: Secondary | ICD-10-CM | POA: Diagnosis not present

## 2022-06-13 DIAGNOSIS — M25512 Pain in left shoulder: Secondary | ICD-10-CM | POA: Diagnosis not present

## 2022-06-24 DIAGNOSIS — J301 Allergic rhinitis due to pollen: Secondary | ICD-10-CM | POA: Diagnosis not present

## 2022-06-24 DIAGNOSIS — J3089 Other allergic rhinitis: Secondary | ICD-10-CM | POA: Diagnosis not present

## 2022-06-24 DIAGNOSIS — M25512 Pain in left shoulder: Secondary | ICD-10-CM | POA: Diagnosis not present

## 2022-06-24 DIAGNOSIS — J3081 Allergic rhinitis due to animal (cat) (dog) hair and dander: Secondary | ICD-10-CM | POA: Diagnosis not present

## 2022-06-28 ENCOUNTER — Ambulatory Visit (INDEPENDENT_AMBULATORY_CARE_PROVIDER_SITE_OTHER): Payer: PPO | Admitting: Family Medicine

## 2022-06-28 ENCOUNTER — Encounter: Payer: Self-pay | Admitting: Family Medicine

## 2022-06-28 VITALS — BP 110/58 | HR 70 | Temp 98.5°F | Ht 62.0 in | Wt 166.8 lb

## 2022-06-28 DIAGNOSIS — I10 Essential (primary) hypertension: Secondary | ICD-10-CM

## 2022-06-28 DIAGNOSIS — R7303 Prediabetes: Secondary | ICD-10-CM | POA: Diagnosis not present

## 2022-06-28 NOTE — Patient Instructions (Signed)
Ask for the high dose Flu vaccine, the updated COVID booster should be coming out first week of October, and get the RSV vaccine.

## 2022-06-28 NOTE — Progress Notes (Signed)
Established Patient Office Visit  Subjective   Patient ID: Brianna Mccarty, female    DOB: January 26, 1941  Age: 81 y.o. MRN: [id number]  Chief Complaint  Patient presents with  . Establish Care    Patient is here for transition of care visit. States that she is still in PT for her left shoulder but it is improving. 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 Other than that she is feeling well no new symptoms or problems to report today.  Current Outpatient Medications  Medication Instructions  . acyclovir (ZOVIRAX) 400 mg, Oral, 5 times daily PRN  . albuterol (PROAIR HFA) 108 (90 Base) MCG/ACT inhaler 2 puffs, Inhalation, Every 6 hours PRN  . albuterol (PROVENTIL) 2.5 mg, Nebulization, Every 6 hours PRN  . alendronate (FOSAMAX) 70 MG tablet TAKE 1 TABLET EVERY 7 (SEVEN) DAYS. TAKE WITH A FULL GLASS OF WATER ON AN EMPTY STOMACH.  Marland Kitchen amLODipine (NORVASC) 10 mg, Oral, Daily  . azelastine (ASTELIN) 0.1 % nasal spray Instill 2 sprays into each nostril two times a day as needed for allergies  . Bempedoic Acid (NEXLETOL) 180 MG TABS 1 tablet, Oral, Daily  . benzonatate (TESSALON) 100-200 mg, Oral, 3 times daily PRN  . betamethasone dipropionate 1.82 % cream 1 application , Topical, 2 times daily PRN  . budesonide-formoterol (SYMBICORT) 160-4.5 MCG/ACT inhaler 2 puffs, Inhalation, 2 times daily  . Cholecalciferol 125 MCG (5000 UT) TABS 1 tablet, Oral, Daily  . desmopressin (DDAVP) 0.2 mg, Oral, Daily at bedtime  . EPINEPHrine (EPI-PEN) 0.3 mg, Intramuscular, As needed  . ezetimibe (ZETIA) 10 mg, Oral, Daily  . fexofenadine (ALLEGRA) 180 mg, Oral, Daily  . furosemide (LASIX) 20 mg, Oral, Daily PRN  . hydrALAZINE (APRESOLINE) 50 MG tablet TAKE 1 TAB 3 TIMES A DAY AND TAKE EXTRA TAB IF SBP>160  . hydrOXYzine (ATARAX) 10 mg, Oral, Daily at bedtime  . losartan (COZAAR) 100 MG tablet TAKE 1 TABLET BY MOUTH EVERY DAY  . METAMUCIL FIBER PO 1 capsule, Oral, Weekly  .  metoprolol tartrate (LOPRESSOR) 12.5 mg, Oral, Daily, Take half a tablet daily  . montelukast (SINGULAIR) 10 mg, Oral, Daily at bedtime  . omeprazole (PRILOSEC) 40 MG capsule TAKE 1 CAPSULE BY MOUTH EVERY DAY  . pentosan polysulfate (ELMIRON) 100 mg, Oral, Daily,    . polyethylene glycol (MIRALAX / GLYCOLAX) 17 g, Oral, Daily PRN  . prazosin (MINIPRESS) 5 MG capsule *NEED OFFICE VISIT* TAKE 1 CAPSULE BY MOUTH TWICE A DAY  . Probiotic Product (PROBIOTIC PO) 1 capsule, Oral, Daily  . promethazine (PHENERGAN) 12.5 mg, Oral, Every 6 hours PRN     Patient Active Problem List   Diagnosis Date Noted  . SVT (supraventricular tachycardia) ([place]) 02/15/2022  . Palpitations 01/15/2022  . Mild aortic stenosis 01/15/2022  . Ascending aorta dilation ([place]) 01/15/2022  . 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  . 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  . Genetic testing 09/13/2020  . Family history of pancreatic cancer 09/05/2020  . Family history of colon cancer 09/05/2020  . Family history of cancer of extrahepatic bile ducts 09/05/2020  . Fever 07/26/2020  . History of COVID-19 07/26/2020  . History of melanoma 05/12/2020  . Macular degeneration 02/17/2019  . OSA (obstructive sleep apnea) 02/12/2018  . Hyperglycemia 02/12/2018  . H/O cold sores 11/27/2015  . Murmur 10/20/2015  . Carotid artery disease ([place])   . Thyroid cyst   . Venous (peripheral) insufficiency 06/02/2009  . Asthma 04/27/2008  .  Melanoma of skin ([place]) 02/10/2008  . Allergic rhinitis 02/10/2008  . INTERSTITIAL CYSTITIS 02/10/2008  . Pure hypercholesterolemia 02/09/2008  . Essential hypertension 02/09/2008  . GERD 02/09/2008  . Osteoporosis 02/09/2008      ROS    Objective:     BP (!) 110/58 (BP Location: Left Arm, Patient Position: Sitting, Cuff Size: Large)   Pulse 70   Temp 98.5 F (36.9 C) (Oral)   Ht 5' 2" (1.575 m)   Wt 166 lb 12.8 oz (75.7 kg)   SpO2 98%   BMI 30.51 kg/m  BP Readings from Last 3  Encounters:  06/28/22 (!) 110/58  05/17/22 139/75  04/04/22 124/68      Physical Exam   No results found for any visits on 06/28/22.  Last metabolic panel Lab Results  Component Value Date   GLUCOSE 94 04/24/2022   NA 140 04/24/2022   K 4.5 04/24/2022   CL 105 04/24/2022   CO2 25 04/24/2022   BUN 16 04/24/2022   CREATININE 0.84 04/24/2022   EGFR 69 04/24/2022   CALCIUM 9.2 04/24/2022   PROT 5.7 (L) 04/24/2022   ALBUMIN 4.1 04/24/2022   LABGLOB 1.6 04/24/2022   AGRATIO 2.6 (H) 04/24/2022   BILITOT 0.3 04/24/2022   ALKPHOS 39 (L) 04/24/2022   AST 14 04/24/2022   ALT 9 04/24/2022   ANIONGAP 8 10/21/2021   Last lipids Lab Results  Component Value Date   CHOL 202 (H) 04/24/2022   HDL 83 04/24/2022   LDLCALC 113 (H) 04/24/2022   LDLDIRECT 126.6 07/18/2010   TRIG 31 04/24/2022   CHOLHDL 2.4 04/24/2022   Last hemoglobin A1c Lab Results  Component Value Date   HGBA1C 5.9 03/20/2022      The ASCVD Risk score (Arnett DK, et al., 2019) failed to calculate for the following reasons:   The 2019 ASCVD risk score is only valid for ages 23 to 88    Assessment & Plan:   Problem List Items Addressed This Visit       Cardiovascular and Mediastinum   Essential hypertension - Primary    No follow-ups on file.    Farrel Conners, MD

## 2022-07-01 NOTE — Assessment & Plan Note (Signed)
Last A1C was 5.9 which does indicate some insulin resistance/prediabetes. We discussed reucing sugar and starches in her diet.

## 2022-07-01 NOTE — Assessment & Plan Note (Signed)
BP is well controlled today on current medications listed below:  Current hypertension medications:      Sig   amLODipine (NORVASC) 10 MG tablet (Taking) Take 1 tablet (10 mg total) by mouth daily.   furosemide (LASIX) 20 MG tablet (Taking) Take 1 tablet (20 mg total) by mouth daily as needed for edema.   hydrALAZINE (APRESOLINE) 50 MG tablet (Taking) TAKE 1 TAB 3 TIMES A DAY AND TAKE EXTRA TAB IF SBP>160   losartan (COZAAR) 100 MG tablet (Taking) TAKE 1 TABLET BY MOUTH EVERY DAY   metoprolol tartrate (LOPRESSOR) 25 MG tablet (Taking) Take 12.5 mg by mouth daily. Take half a tablet daily   prazosin (MINIPRESS) 5 MG capsule (Taking) *NEED OFFICE VISIT* TAKE 1 CAPSULE BY MOUTH TWICE A DAY

## 2022-07-02 DIAGNOSIS — J3081 Allergic rhinitis due to animal (cat) (dog) hair and dander: Secondary | ICD-10-CM | POA: Diagnosis not present

## 2022-07-02 DIAGNOSIS — J3089 Other allergic rhinitis: Secondary | ICD-10-CM | POA: Diagnosis not present

## 2022-07-02 DIAGNOSIS — J301 Allergic rhinitis due to pollen: Secondary | ICD-10-CM | POA: Diagnosis not present

## 2022-07-02 DIAGNOSIS — M25512 Pain in left shoulder: Secondary | ICD-10-CM | POA: Diagnosis not present

## 2022-07-03 DIAGNOSIS — H6123 Impacted cerumen, bilateral: Secondary | ICD-10-CM | POA: Diagnosis not present

## 2022-07-04 DIAGNOSIS — M25512 Pain in left shoulder: Secondary | ICD-10-CM | POA: Diagnosis not present

## 2022-07-10 DIAGNOSIS — M25512 Pain in left shoulder: Secondary | ICD-10-CM | POA: Diagnosis not present

## 2022-07-11 ENCOUNTER — Encounter: Payer: Self-pay | Admitting: Family Medicine

## 2022-07-11 ENCOUNTER — Encounter: Payer: Self-pay | Admitting: Nurse Practitioner

## 2022-07-11 ENCOUNTER — Ambulatory Visit (INDEPENDENT_AMBULATORY_CARE_PROVIDER_SITE_OTHER): Payer: PPO | Admitting: Family Medicine

## 2022-07-11 VITALS — BP 140/70 | HR 65 | Temp 98.3°F | Ht 62.0 in | Wt 168.8 lb

## 2022-07-11 DIAGNOSIS — J3089 Other allergic rhinitis: Secondary | ICD-10-CM | POA: Diagnosis not present

## 2022-07-11 DIAGNOSIS — J3081 Allergic rhinitis due to animal (cat) (dog) hair and dander: Secondary | ICD-10-CM | POA: Diagnosis not present

## 2022-07-11 DIAGNOSIS — J301 Allergic rhinitis due to pollen: Secondary | ICD-10-CM | POA: Diagnosis not present

## 2022-07-11 DIAGNOSIS — K5792 Diverticulitis of intestine, part unspecified, without perforation or abscess without bleeding: Secondary | ICD-10-CM

## 2022-07-11 DIAGNOSIS — R110 Nausea: Secondary | ICD-10-CM

## 2022-07-11 MED ORDER — CIPROFLOXACIN HCL 500 MG PO TABS: 500.0000 mg | ORAL_TABLET | Freq: Two times a day (BID) | ORAL | 0 refills | Status: AC

## 2022-07-11 MED ORDER — METRONIDAZOLE 500 MG PO TABS: 500.0000 mg | ORAL_TABLET | Freq: Three times a day (TID) | ORAL | 0 refills | Status: AC

## 2022-07-11 MED ORDER — ONDANSETRON HCL 4 MG PO TABS: 4.0000 mg | ORAL_TABLET | Freq: Three times a day (TID) | ORAL | 0 refills | Status: DC | PRN

## 2022-07-11 NOTE — Patient Instructions (Signed)
You may use up to 8 tablets of imodium per day as needed for diarrhea/ mulitple BM's.

## 2022-07-11 NOTE — Progress Notes (Signed)
Established Patient Office Visit  Subjective   Patient ID: Brianna Mccarty, female    DOB: 28-Apr-1941  Age: 81 y.o. MRN: [id number]  Chief Complaint  Patient presents with   Abdominal Pain    Patient complains of abdominal pain, increased number of normal-loose bowel movements approximately 6-7 times a day x2 weeks, also complains of bloating and flatulence   Fatigue    Pt is reporting 1-2 week history of abdominal pain, states it started with diarrhea about 2-3 days after she saw me in August. States that she is having abdominal cramping in the center of her abdomen, is having 6-7  BM's per day, states that it is formed, not watery, no fever or chills, some nausea but not vomiting. States that it has persisted, doesn't seem to be improving. Eating doesn't make it better or worse, states that she cannot tell if anything is making it better. States she took imodium the first day but nothing since. I reviewed her last colonoscopy in 2016-- it showed mild diverticulosis in the sigmoid colon. No blood in her stool.   Abdominal Pain This is a new problem. The pain is located in the periumbilical region. The pain is moderate. The quality of the pain is dull, cramping and a sensation of fullness.      Review of Systems  Gastrointestinal:  Positive for abdominal pain.      Objective:     BP (!) 140/70 (BP Location: Left Arm, Patient Position: Sitting, Cuff Size: Large)   Pulse 65   Temp 98.3 F (36.8 C) (Oral)   Ht '5\' 2"'$  (1.575 m)   Wt 168 lb 12.8 oz (76.6 kg)   SpO2 98%   BMI 30.87 kg/m    Physical Exam Vitals reviewed.  Constitutional:      Appearance: She is well-developed and normal weight.  Abdominal:     General: Bowel sounds are normal. There is distension.     Palpations: Abdomen is soft. There is no fluid wave.     Tenderness: There is abdominal tenderness in the periumbilical area. There is no guarding or rebound.  Neurological:     Mental Status: She is alert.       No results found for any visits on 07/11/22.    The ASCVD Risk score (Arnett DK, et al., 2019) failed to calculate for the following reasons:   The 2019 ASCVD risk score is only valid for ages 56 to 39    Assessment & Plan:   Problem List Items Addressed This Visit       Digestive   Acute diverticulitis    Patient had confirmed diverticulosis on colonoscopy in 2016, this seems to be an acute flare up of this problem. She does not have any of the other more concerning signs, no rebound tenderness, no fever, the pain is not severe and there is no vomiting. Will treat empirically with 7 day course of abx, she will continue her fiber and probiotics at home. If it does not improve with the abx then she will keep her appt with the GI doctor on 10/4, she was advised to message me next week, we may need to obtain stool studies/ culture or additional imaging.      Relevant Medications   ciprofloxacin (CIPRO) 500 MG tablet   metroNIDAZOLE (FLAGYL) 500 MG tablet   Other Visit Diagnoses     Nausea    -  Primary   Relevant Medications   ondansetron (ZOFRAN) 4 MG tablet  No follow-ups on file.    Farrel Conners, MD

## 2022-07-11 NOTE — Assessment & Plan Note (Addendum)
Patient had confirmed diverticulosis on colonoscopy in 2016, this seems to be an acute flare up of this problem. She does not have any of the other more concerning signs, no rebound tenderness, no fever, the pain is not severe and there is no vomiting. Will treat empirically with 7 day course of abx, she will continue her fiber and probiotics at home. If it does not improve with the abx then she will keep her appt with the GI doctor on 10/4, she was advised to message me next week, we may need to obtain stool studies/ culture or additional imaging.

## 2022-07-12 ENCOUNTER — Telehealth: Payer: Self-pay | Admitting: *Deleted

## 2022-07-12 NOTE — Telephone Encounter (Signed)
Prior auth for Ondansetron sent to [email or web address] [id number] pending review by insurance.

## 2022-07-15 DIAGNOSIS — M25512 Pain in left shoulder: Secondary | ICD-10-CM | POA: Diagnosis not present

## 2022-07-15 DIAGNOSIS — J3089 Other allergic rhinitis: Secondary | ICD-10-CM | POA: Diagnosis not present

## 2022-07-15 DIAGNOSIS — J301 Allergic rhinitis due to pollen: Secondary | ICD-10-CM | POA: Diagnosis not present

## 2022-07-15 DIAGNOSIS — J3081 Allergic rhinitis due to animal (cat) (dog) hair and dander: Secondary | ICD-10-CM | POA: Diagnosis not present

## 2022-07-15 NOTE — Telephone Encounter (Signed)
Fax received from [facility] stating the request was denied and given to PCP for review.

## 2022-07-16 NOTE — Telephone Encounter (Signed)
Pt is feeling better per the last message, she will not need the medication.

## 2022-07-16 NOTE — Telephone Encounter (Signed)
Noted  

## 2022-07-18 ENCOUNTER — Other Ambulatory Visit: Payer: Self-pay | Admitting: Family Medicine

## 2022-07-18 ENCOUNTER — Telehealth: Payer: Self-pay | Admitting: *Deleted

## 2022-07-18 ENCOUNTER — Telehealth: Payer: Self-pay | Admitting: Family Medicine

## 2022-07-18 DIAGNOSIS — K5792 Diverticulitis of intestine, part unspecified, without perforation or abscess without bleeding: Secondary | ICD-10-CM

## 2022-07-18 DIAGNOSIS — R110 Nausea: Secondary | ICD-10-CM

## 2022-07-18 MED ORDER — ONDANSETRON 4 MG PO TBDP: 4.0000 mg | ORAL_TABLET | Freq: Three times a day (TID) | ORAL | 0 refills | Status: DC | PRN

## 2022-07-18 NOTE — Telephone Encounter (Signed)
Patient informed of the message below.

## 2022-07-18 NOTE — Telephone Encounter (Signed)
I will send in a different form of the zofran to see if they will approve it-- as long as she is having regular BM's then I won't order more testing-- if she gets worse then I advise she go to the ER

## 2022-07-18 NOTE — Telephone Encounter (Signed)
Is she still having pain? Her x-ray showed a dilated loop of bowel and I was concerned about recurrent small bowel obstruction

## 2022-07-18 NOTE — Telephone Encounter (Signed)
Spoke with the patient and she complains of recurrent nausea; Zofran was denied by the insurance and the pain is still present but better than last week.  Message sent to PCP.

## 2022-07-18 NOTE — Telephone Encounter (Signed)
Pt is calling  and seen dr Legrand Como on 07-11-2022 and was given 2 abx for diverticulitis and the  2 abx only help with the gas and pt would like to know the next step

## 2022-07-18 NOTE — Telephone Encounter (Signed)
[facility] faxed a prior auth request for Ondansetron '4mg'$  ODT.  PA was sent to [email or web address] pending review by insurance.

## 2022-07-19 DIAGNOSIS — M25512 Pain in left shoulder: Secondary | ICD-10-CM | POA: Diagnosis not present

## 2022-07-19 NOTE — Telephone Encounter (Signed)
Fax received stating the request was denied and given to PCP for review. 

## 2022-07-22 DIAGNOSIS — J3089 Other allergic rhinitis: Secondary | ICD-10-CM | POA: Diagnosis not present

## 2022-07-22 DIAGNOSIS — J3081 Allergic rhinitis due to animal (cat) (dog) hair and dander: Secondary | ICD-10-CM | POA: Diagnosis not present

## 2022-07-22 DIAGNOSIS — J301 Allergic rhinitis due to pollen: Secondary | ICD-10-CM | POA: Diagnosis not present

## 2022-07-24 DIAGNOSIS — M25512 Pain in left shoulder: Secondary | ICD-10-CM | POA: Diagnosis not present

## 2022-07-26 ENCOUNTER — Ambulatory Visit: Payer: PPO | Attending: Physician Assistant

## 2022-07-26 DIAGNOSIS — E7800 Pure hypercholesterolemia, unspecified: Secondary | ICD-10-CM | POA: Diagnosis not present

## 2022-07-26 LAB — LIPID PANEL
Chol/HDL Ratio: 2.5 ratio (ref 0.0–4.4)
Cholesterol, Total: 192 mg/dL (ref 100–199)
HDL: 76 mg/dL (ref >39)
LDL Chol Calc (NIH): 109 mg/dL — ABNORMAL HIGH (ref 0–99)
Triglycerides: 35 mg/dL (ref 0–149)
VLDL Cholesterol Cal: 7 mg/dL (ref 5–40)

## 2022-07-26 LAB — HEPATIC FUNCTION PANEL
ALT: 7 IU/L (ref 0–32)
AST: 12 IU/L (ref 0–40)
Albumin: 4.4 g/dL (ref 3.7–4.7)
Alkaline Phosphatase: 36 IU/L — ABNORMAL LOW (ref 44–121)
Bilirubin Total: 0.3 mg/dL (ref 0.0–1.2)
Bilirubin, Direct: 0.13 mg/dL (ref 0.00–0.40)
Total Protein: 6.3 g/dL (ref 6.0–8.5)

## 2022-07-29 ENCOUNTER — Telehealth: Payer: Self-pay | Admitting: *Deleted

## 2022-07-29 DIAGNOSIS — M25512 Pain in left shoulder: Secondary | ICD-10-CM | POA: Diagnosis not present

## 2022-07-29 DIAGNOSIS — J3089 Other allergic rhinitis: Secondary | ICD-10-CM | POA: Diagnosis not present

## 2022-07-29 DIAGNOSIS — E7800 Pure hypercholesterolemia, unspecified: Secondary | ICD-10-CM

## 2022-07-29 DIAGNOSIS — J301 Allergic rhinitis due to pollen: Secondary | ICD-10-CM | POA: Diagnosis not present

## 2022-07-29 NOTE — Telephone Encounter (Signed)
-----   Message from Liliane Shi, [place] sent at 07/28/2022  2:32 PM EDT ----- LDL marginally improved and still above goal. LFTs normal.  PLAN:  -Continue current meds -Refer to [facility] for consideration of PCSK9 inhibitor  Richardson Dopp, PA-C    07/28/2022 2:30 PM

## 2022-07-31 DIAGNOSIS — M25512 Pain in left shoulder: Secondary | ICD-10-CM | POA: Diagnosis not present

## 2022-08-01 NOTE — Progress Notes (Unsigned)
Cardiology Office Note:    Date:  08/01/2022   ID:  Brianna Mccarty, DOB 09-30-1941, MRN [id number]  PCP:  Farrel Conners, MD   [facility] Providers Cardiologist:  Freada Bergeron, MD {   Referring MD: Brianna Macadam, MD    History of Present Illness:    Brianna Mccarty is a 81 y.o. female with a hx of HTN, HLD, OSA and mild AS who was previously followed by Dr. Meda Mccarty who now returns to clinic for follow-up.  TTE 04/2021 with LVEF 60-65%, no RWMA, mild concentric LVH, grade 2 DDAS, mild MR, mild AS, mild dilation of ascending aorta. Cardiac monitor that was obtained for palpitations in 01/2022 showed frequent SVT (1674 episodes) with longest lasting 18mn. She was recommended for metop but developed fatigue and this was decreased to 12.'5mg'$  daily.  Was last seen in clinic by EAmbrose Pancoast NP where her fatigue and palpitations had improved. She was willing to discuss PCSK9i at that time.   Today, ***  Past Medical History:  Diagnosis Date   Abnormal EKG    Normal LV function in the past   Allergic rhinitis    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   Echocardiogram 6/22: 40 mm   Asthma    Bronchitis, mucopurulent recurrent (HCC)    Carotid artery disease (HCC)    a. mild by carotid duplex.   Cervical dysplasia 1971   Coronary artery disease    Diverticulosis of colon    DJD (degenerative joint disease)    Family history of cancer of extrahepatic bile ducts 09/05/2020   Family history of colon cancer 09/05/2020   Family history of pancreatic cancer 09/05/2020   GERD (gastroesophageal reflux disease)    Headache(784.0)    History of kidney stones    Hypercholesterolemia    Hypertension    Interstitial cystitis    sees urologist   Lichen sclerosus    Malignant melanoma (HOpp 2006   sees Dr. HNevada Mccarty dermatology   Migraines    Mild aortic stenosis    Mitral valve disease    Question mitral valve prolapse in the past, no prolapse by echo 2009   Murmur  10/20/2015   SEES DR NELSON   OSA (obstructive sleep apnea)    USES DENTAL DEVICE   Osteoporosis    on fosomax > 5 years, stopped 11/2015   Pneumonia    SVT (supraventricular tachycardia) ([facility]Helper 02/15/2022   Monitor 02/2022: NSR, avg HR 61; 2 runs of NSVT (4 beats); several runs of Supraventricular Tachycardia (longest 3"11'); no AFib   Thyroid cyst    1 x 1.1 thyroid cyst noted on carotid Doppler, January, 2012   Venous insufficiency     Past Surgical History:  Procedure Laterality Date   BREAST BIOPSY Left 11/25/2011   [facility]   BREAST CYST ASPIRATION     BREAST SURGERY  2013   Breast Bx-Benign   CARDIAC CATHETERIZATION     CATARACT EXTRACTION, BILATERAL     CHOLECYSTECTOMY N/A 12/22/2019   Procedure: LAPAROSCOPIC CHOLECYSTECTOMY;  Surgeon: WIleana Roup MD;  [place] [facility]  Service: General;  Laterality: N/A;   cystoscopy and basket stone removal right ureter  02/2006   Dr. PRobbi GarterSURGERY  12/22/2019   GFort Mohave  left total hip replacement  2004   Dr. GGladstone Lighter  melanoma removed from medial rleft knee area  2006  Dr. [place] Crane   NASAL SEPTUM SURGERY     TOTAL HIP ARTHROPLASTY Right 08/01/2021   Procedure: TOTAL HIP ARTHROPLASTY ANTERIOR APPROACH;  Surgeon: Brianna Arabian, MD;  Location: [facility];  Service: Orthopedics;  Laterality: Right;    Current Medications: No outpatient medications have been marked as taking for the 08/07/22 encounter (Appointment) with Freada Bergeron, MD.     Allergies:   Celebrex [celecoxib], Cephalexin, Hydrochlorothiazide, Penicillins, Phenobarbital, Statins, Passion fruit flavor, and Tape   Social History   Socioeconomic History   Marital status: Married    Spouse name: Brianna Mccarty   Number of children: 2   Years of education: Not on file   Highest education level: Not on file  Occupational History   Occupation: retired     Fish farm manager: RETIRED  Tobacco Use   Smoking status: Never   Smokeless tobacco: Never  Vaping Use   Vaping Use: Never used  Substance and Sexual Activity   Alcohol use: Yes    Alcohol/week: 1.0 standard drink of alcohol    Types: 1 Standard drinks or equivalent per week    Comment:  OCC WINE   Drug use: No   Sexual activity: Not Currently    Birth control/protection: Post-menopausal    Comment: 1st intercourse 22 yo-1 partner  Other Topics Concern   Not on file  Social History Narrative   Updated 12/13/2019   Work or School: none      Home Situation: lives with husband      Spiritual Beliefs: christian      Lifestyle: regular exercise; healthy diet      12/13/2019: Uses treadmill 3/x weekly.    Looks after sister who has been having memory decline, as well  as husband with minor memory decline.    Enjoys writing poetry, reading, going to church, travel   Social Determinants of Health   Financial Resource Strain: Low Risk  (04/15/2022)   Overall Financial Resource Strain (CARDIA)    Difficulty of Paying Living Expenses: Not hard at all  Food Insecurity: No Food Insecurity (04/15/2022)   Hunger Vital Sign    Worried About Running Out of Food in the Last Year: Never true    Ran Out of Food in the Last Year: Never true  Transportation Needs: No Transportation Needs (04/15/2022)   PRAPARE - Hydrologist (Medical): No    Lack of Transportation (Non-Medical): No  Physical Activity: Inactive (04/15/2022)   Exercise Vital Sign    Days of Exercise per Week: 0 days    Minutes of Exercise per Session: 0 min  Stress: No Stress Concern Present (04/15/2022)   [place]    Feeling of Stress : Not at all  Social Connections: Moderately Integrated (03/27/2021)   Social Connection and Isolation Panel [NHANES]    Frequency of Communication with Friends and Family: More than three times a week     Frequency of Social Gatherings with Friends and Family: More than three times a week    Attends Religious Services: More than 4 times per year    Active Member of [employer] or Organizations: No    Attends Music therapist: Never    Marital Status: Married     Family History: The patient's family history includes Cancer (age of onset: 29) in her brother; Colon cancer in her paternal uncle; Diabetes in her brother, brother, and mother; Heart disease in her brother and mother; Hypertension in her  brother, brother, sister, and sister; Lung cancer in her paternal uncle; Pancreatic cancer (age of onset: 50) in her father.  ROS:   Please see the history of present illness.    Review of Systems  Constitutional:  Negative for chills and fever.  HENT:  Negative for hearing loss.   Eyes:  Negative for blurred vision.  Respiratory:  Negative for shortness of breath.   Cardiovascular:  Negative for chest pain, palpitations, orthopnea, claudication, leg swelling and PND.  Gastrointestinal:  Negative for melena, nausea and vomiting.  Genitourinary:  Negative for dysuria.  Musculoskeletal:  Positive for back pain, joint pain and myalgias.  Neurological:  Negative for dizziness and loss of consciousness.  Psychiatric/Behavioral:  Negative for substance abuse.     EKGs/Labs/Other Studies Reviewed:    The following studies were reviewed today: Cardiac Monitor 02/2022: Patch wear time was 14 days Predominant rhythm was NSR with average HR 61bpm (ranging from 40-218bpm) There were 2 runs of nonsustained VT with longest lasting 4 beats There were 1674 runs of SVT with longest lasting 89mn and 11 sec There were occasional SVE including couplets and triplets Rare VE (<1%) No Afib or significant pauses     Patch Wear Time:  14 days and 0 hours (2023-03-15T09:54:44-0400 to 2023-03-29T09:54:48-0400)   Patient had a min HR of 40 bpm, max HR of 218 bpm, and avg HR of 61 bpm. Predominant  underlying rhythm was Sinus Rhythm. 2 Ventricular Tachycardia runs occurred, the run with the fastest interval lasting 4 beats with a max rate of 158 bpm, the longest  lasting 4 beats with an avg rate of 113 bpm. 1674 Supraventricular Tachycardia runs occurred, the run with the fastest interval lasting 8 beats with a max rate of 218 bpm, the longest lasting 3 mins 11 secs with an avg rate of 104 bpm. Isolated SVEs were  occasional (2.3%, 28550), SVE Couplets were occasional (1.0%, 6221), and SVE Triplets were occasional (1.1%, 4286). Isolated VEs were rare (<1.0%, 3110), VE Couplets were rare (<1.0%, 155), and VE Triplets were rare (<1.0%, 23). Ventricular Bigeminy and  Trigeminy were present.   Myoview 06/2021: The left ventricular ejection fraction is hyperdynamic (>65%). Nuclear stress EF: 74%. There was no ST segment deviation noted during stress. The study is normal. This is a low risk study.   1. Fixed apical inferior perfusion defect with normal wall motion, consistent with artifact 2. Low risk study  TTE 04/23/22: IMPRESSIONS     1. Left ventricular ejection fraction, by estimation, is 60 to 65%. The  left ventricle has normal function. The left ventricle has no regional  wall motion abnormalities. There is mild concentric left ventricular  hypertrophy. Left ventricular diastolic  parameters are consistent with Grade II diastolic dysfunction  (pseudonormalization). Elevated left ventricular end-diastolic pressure.   2. Right ventricular systolic function is normal. The right ventricular  size is normal. There is normal pulmonary artery systolic pressure.   3. Left atrial size was severely dilated.   4. Right atrial size was mildly dilated.   5. The mitral valve is abnormal. Mild mitral valve regurgitation. No  evidence of mitral stenosis. Severe mitral annular calcification.   6. The aortic valve is calcified. There is moderate calcification of the  aortic valve. There is  moderate thickening of the aortic valve. Aortic  valve regurgitation is trivial. Mild aortic valve stenosis.   7. Aortic dilatation noted. There is mild dilatation of the ascending  aorta, measuring 40 mm.   8. The  inferior vena cava is normal in size with greater than 50%  respiratory variability, suggesting right atrial pressure of 3 mmHg.   ECho 06/2018 Study Conclusions - Left ventricle: The cavity size was normal. Wall thickness was    normal. Systolic function was normal. The estimated ejection    fraction was in the range of 55% to 60%. Wall motion was normal;    there were no regional wall motion abnormalities. Doppler    parameters are consistent with abnormal left ventricular    relaxation (grade 1 diastolic dysfunction). Doppler parameters    are consistent with elevated mean left atrial filling pressure.  - Aortic valve: There was mild stenosis.  - Mitral valve: Severely calcified annulus. Mildly thickened    leaflets .  - Left atrium: The atrium was severely dilated.  - Atrial septum: No defect or patent foramen ovale was identified.   EKG:  EKG is ordered today.  The ekg ordered today demonstrates NSR with HR65  Recent Labs: 03/20/2022: Hemoglobin 11.8; Platelets 221.0; TSH 1.94 04/04/2022: Magnesium 1.9 04/24/2022: BUN 16; Creatinine, Ser 0.84; Potassium 4.5; Sodium 140 07/26/2022: ALT 7  Recent Lipid Panel    Component Value Date/Time   CHOL 192 07/26/2022 1147   TRIG 35 07/26/2022 1147   HDL 76 07/26/2022 1147   CHOLHDL 2.5 07/26/2022 1147   CHOLHDL 3 11/09/2021 0938   VLDL 8.4 11/09/2021 0938   LDLCALC 109 (H) 07/26/2022 1147   LDLCALC 105 (H) 05/12/2020 1448   LDLDIRECT 126.6 07/18/2010 1045      Physical Exam:    VS:  There were no vitals taken for this visit.    Wt Readings from Last 3 Encounters:  07/11/22 168 lb 12.8 oz (76.6 kg)  06/28/22 166 lb 12.8 oz (75.7 kg)  05/17/22 165 lb (74.8 kg)     GEN:  Well nourished, well developed in no acute  distress HEENT: Normal NECK: No JVD; No carotid bruits CARDIAC: RRR, no murmurs, rubs, gallops RESPIRATORY:  Clear to auscultation without rales, wheezing or rhonchi  ABDOMEN: Soft, non-tender, non-distended MUSCULOSKELETAL:  No edema; No deformity  SKIN: Warm and dry NEUROLOGIC:  Alert and oriented x 3 PSYCHIATRIC:  Normal affect   ASSESSMENT:    No diagnosis found.  PLAN:    In order of problems listed above:  #Palpitations: #SVT: Cardiac monitor 02/2022 with 1674 runs of SVT and was started on low dose metop 12.'5mg'$  XL daily as could not toelrate higher doses due to fatigue. Currently, palpitations are well controlled. -Continue metop 12.'5mg'$  XL daily  #HTN: Well controlled -Continue amlodipine '10mg'$  daily -Continue hydralazine '50mg'$  TID -Continue losartan '100mg'$  daily -Continue lasix '20mg'$  daily  #OSA: -Has dentures which has helped  #Mild AS: TTE 04/2021 with mean gardient 50mHg, AVA 1.4cm2.  -Continue serial monitoring with next 04/2023  #HLD: Intolerant of statins. More open to PCSK9i -Continue Nexletol '180mg'$  daily   Medication Adjustments/Labs and Tests Ordered: Current medicines are reviewed at length with the patient today.  Concerns regarding medicines are outlined above.  No orders of the defined types were placed in this encounter.  No orders of the defined types were placed in this encounter.   There are no Patient Instructions on file for this visit.    Signed, HFreada Bergeron MD  08/01/2022 2:01 PM    [facility]

## 2022-08-05 ENCOUNTER — Other Ambulatory Visit: Payer: Self-pay | Admitting: *Deleted

## 2022-08-05 MED ORDER — PRAZOSIN HCL 5 MG PO CAPS
ORAL_CAPSULE | ORAL | 1 refills | Status: DC
Start: 2022-08-05 — End: 2023-02-24

## 2022-08-05 NOTE — Telephone Encounter (Signed)
Ok to refill 

## 2022-08-05 NOTE — Addendum Note (Signed)
Addended by: Agnes Lawrence on: 08/05/2022 10:52 AM   Modules accepted: Orders

## 2022-08-06 DIAGNOSIS — M25512 Pain in left shoulder: Secondary | ICD-10-CM | POA: Diagnosis not present

## 2022-08-07 ENCOUNTER — Ambulatory Visit (INDEPENDENT_AMBULATORY_CARE_PROVIDER_SITE_OTHER): Payer: PPO | Admitting: Nurse Practitioner

## 2022-08-07 ENCOUNTER — Ambulatory Visit: Payer: PPO | Attending: Cardiology | Admitting: Cardiology

## 2022-08-07 ENCOUNTER — Other Ambulatory Visit (INDEPENDENT_AMBULATORY_CARE_PROVIDER_SITE_OTHER): Payer: PPO

## 2022-08-07 ENCOUNTER — Other Ambulatory Visit: Payer: Self-pay | Admitting: Nurse Practitioner

## 2022-08-07 ENCOUNTER — Encounter: Payer: Self-pay | Admitting: Cardiology

## 2022-08-07 ENCOUNTER — Encounter: Payer: Self-pay | Admitting: Nurse Practitioner

## 2022-08-07 VITALS — BP 116/60 | HR 57 | Ht 62.0 in | Wt 165.4 lb

## 2022-08-07 VITALS — BP 114/60 | HR 65 | Ht 62.0 in | Wt 165.0 lb

## 2022-08-07 DIAGNOSIS — G4733 Obstructive sleep apnea (adult) (pediatric): Secondary | ICD-10-CM | POA: Diagnosis not present

## 2022-08-07 DIAGNOSIS — R002 Palpitations: Secondary | ICD-10-CM | POA: Diagnosis not present

## 2022-08-07 DIAGNOSIS — I350 Nonrheumatic aortic (valve) stenosis: Secondary | ICD-10-CM | POA: Diagnosis not present

## 2022-08-07 DIAGNOSIS — E785 Hyperlipidemia, unspecified: Secondary | ICD-10-CM

## 2022-08-07 DIAGNOSIS — I10 Essential (primary) hypertension: Secondary | ICD-10-CM | POA: Diagnosis not present

## 2022-08-07 DIAGNOSIS — I77810 Thoracic aortic ectasia: Secondary | ICD-10-CM | POA: Diagnosis not present

## 2022-08-07 DIAGNOSIS — A09 Infectious gastroenteritis and colitis, unspecified: Secondary | ICD-10-CM

## 2022-08-07 DIAGNOSIS — I77819 Aortic ectasia, unspecified site: Secondary | ICD-10-CM | POA: Diagnosis not present

## 2022-08-07 LAB — CBC WITH DIFFERENTIAL/PLATELET
Basophils Absolute: 0 10*3/uL (ref 0.0–0.1)
Basophils Relative: 1 % (ref 0.0–3.0)
Eosinophils Absolute: 0 10*3/uL (ref 0.0–0.7)
Eosinophils Relative: 1 % (ref 0.0–5.0)
HCT: 33.3 % — ABNORMAL LOW (ref 36.0–46.0)
Hemoglobin: 11.4 g/dL — ABNORMAL LOW (ref 12.0–15.0)
Lymphocytes Relative: 28.2 % (ref 12.0–46.0)
Lymphs Abs: 1.4 10*3/uL (ref 0.7–4.0)
MCHC: 34.2 g/dL (ref 30.0–36.0)
MCV: 88.9 fl (ref 78.0–100.0)
Monocytes Absolute: 0.5 10*3/uL (ref 0.1–1.0)
Monocytes Relative: 9.5 % (ref 3.0–12.0)
Neutro Abs: 3 10*3/uL (ref 1.4–7.7)
Neutrophils Relative %: 60.3 % (ref 43.0–77.0)
Platelets: 216 10*3/uL (ref 150.0–400.0)
RBC: 3.75 Mil/uL — ABNORMAL LOW (ref 3.87–5.11)
RDW: 13.9 % (ref 11.5–15.5)
WBC: 4.9 10*3/uL (ref 4.0–10.5)

## 2022-08-07 LAB — COMPREHENSIVE METABOLIC PANEL
ALT: 7 U/L (ref 0–35)
AST: 12 U/L (ref 0–37)
Albumin: 3.9 g/dL (ref 3.5–5.2)
Alkaline Phosphatase: 31 U/L — ABNORMAL LOW (ref 39–117)
BUN: 15 mg/dL (ref 6–23)
CO2: 29 mEq/L (ref 19–32)
Calcium: 9.2 mg/dL (ref 8.4–10.5)
Chloride: 105 mEq/L (ref 96–112)
Creatinine, Ser: 0.8 mg/dL (ref 0.40–1.20)
GFR: 68.47 mL/min (ref >60.00)
Glucose, Bld: 132 mg/dL — ABNORMAL HIGH (ref 70–99)
Potassium: 3.8 mEq/L (ref 3.5–5.1)
Sodium: 139 mEq/L (ref 135–145)
Total Bilirubin: 0.3 mg/dL (ref 0.2–1.2)
Total Protein: 6.1 g/dL (ref 6.0–8.3)

## 2022-08-07 NOTE — Patient Instructions (Signed)
Medication Instructions:   Your physician recommends that you continue on your current medications as directed. Please refer to the Current Medication list given to you today.  *If you need a refill on your cardiac medications before your next appointment, please call your pharmacy*   Testing/Procedures:  Your physician has requested that you have an echocardiogram. Echocardiography is a painless test that uses sound waves to create images of your heart. It provides your doctor with information about the size and shape of your heart and how well your heart's chambers and valves are working. This procedure takes approximately one hour. There are no restrictions for this procedure.   Follow-Up: At [facility], you and your health needs are our priority.  As part of our continuing mission to provide you with exceptional heart care, we have created designated Provider Care Teams.  These Care Teams include your primary Cardiologist (physician) and Advanced Practice Providers (APPs -  Physician Assistants and Nurse Practitioners) who all work together to provide you with the care you need, when you need it.  We recommend signing up for the patient portal called "MyChart".  Sign up information is provided on this After Visit Summary.  MyChart is used to connect with patients for Virtual Visits (Telemedicine).  Patients are able to view lab/test results, encounter notes, upcoming appointments, etc.  Non-urgent messages can be sent to your provider as well.   To learn more about what you can do with MyChart, go to [email or web address].    Your next appointment:   6 month(s)  The format for your next appointment:   In Person  Provider:   Freada Bergeron, MD      Important Information About Sugar

## 2022-08-07 NOTE — Progress Notes (Signed)
08/07/2022 Brianna Mccarty [id number] 1941/02/02   CHIEF COMPLAINT: Diarrhea,  HISTORY OF PRESENT ILLNESS: Brianna Mccarty. Mccarty is an 81 year old female with a past medical history of asthma, OSA uses dental device, hypertension, hypercholesterolemia, mild aortic stenosis, SVT, ascending aortic dilatation, malignant melanoma 2006, interstitial cystitis, pancreatic cysts per MRI, GERD and diverticulosis.  Past right and left hip replacement surgery. She has known by Dr. Fuller Plan.  She presents today self-referred for further evaluation regarding nausea, generalized abdominal pain and diarrhea.  She started having mild nausea without vomiting a few months ago but did not really pay much attention to it.  Approximately 3 weeks ago, she started having generalized central abdominal pain and nonbloody watery diarrhea.  She reported having up to 10 episodes of diarrhea daily.  She took Imodium once which resulted in feeling constipation and plugged up so she did not take any further doses. She was seen by her PCP Dr. Loralyn Freshwater on 07/11/2022 who prescribed Cipro 500 mg twice daily and Metronidazole 500 mg twice daily for 7 days for possible diverticulitis. Since then, she is having less nausea, abdominal pain and diarrhea.  Diarrhea decreased to 5 episodes daily and today she passed a softly formed bowel movement.  No rectal bleeding or black stools.  She had mild centralized abdominal discomfort earlier today, no abdominal pain at this time.  No fevers.  She has intentionally lost 20 pounds over the past year by diet changes.  She reports taking several courses of antibiotics for an ear infection, strep throat and sinus infection April and May 2023.  She takes a probiotic daily for many years.  She started taking Benefiber 2 weeks ago.  She avoids gluten and fried foods.  Labs 03/20/2022 showed a mild hemoglobin 11.8 and hematocrit 34.9.  Her most recent colonoscopy was 02/07/2015 which showed mild diverticulosis  to the sigmoid colon otherwise was normal.  No further colonoscopies were recommended due to age.  Her paternal uncle died in his 82s from colon cancer, father died at the age of 85 from pancreatic cancer and her brother died from biliary cancer in his late 1s.  She has a history of multiple pancreatic cysts which have been monitored by MRI.  Her most recent abdominal MRI was 04/06/2022 which showed multiple benign-appearing cysts scattered throughout the pancreas, 1 cyst arising off the body the pancreas has increased in size compared with previous exam measuring 1.5 cm on today's study versus 1.1 cm previously.  The remaining cysts were unchanged.  She is under a lot of stress.  Her adult daughter is in poor health and lives with her.  She also tends to her elderly sister who lives in assisted living.      Latest Ref Rng & Units 03/20/2022    3:32 PM 10/21/2021    8:51 AM 08/02/2021    3:17 AM  CBC  WBC 4.0 - 10.5 K/uL 8.5  7.3  10.5   Hemoglobin 12.0 - 15.0 g/dL 11.8  12.3  10.9   Hematocrit 36.0 - 46.0 % 34.9  37.9  32.9   Platelets 150.0 - 400.0 K/uL 221.0  244  206        Latest Ref Rng & Units 07/26/2022   11:47 AM 04/24/2022    7:24 AM 04/04/2022    4:17 PM  CMP  Glucose 70 - 99 mg/dL  94  89   BUN 8 - 27 mg/dL  16  16   Creatinine 0.57 -  1.00 mg/dL  0.84  0.74   Sodium 134 - 144 mmol/L  140  137   Potassium 3.5 - 5.2 mmol/L  4.5  4.0   Chloride 96 - 106 mmol/L  105  101   CO2 20 - 29 mmol/L  25  24   Calcium 8.7 - 10.3 mg/dL  9.2  8.9   Total Protein 6.0 - 8.5 g/dL 6.3  5.7    Total Bilirubin 0.0 - 1.2 mg/dL 0.3  0.3    Alkaline Phos 44 - 121 IU/L 36  39    AST 0 - 40 IU/L 12  14    ALT 0 - 32 IU/L 7  9     TSH 1.94.   IMAGE STUDIES:  Abdominal MRI MRCP with and without contrast 04/08/2022: 1. Multiple benign appearing cysts are again noted scattered throughout the pancreas. A cyst arising off the body of pancreas has increased in size compared with previous exam  measuring 1.5 cm on today's study versus 1.1 cm previously. The remaining cysts are unchanged in the interval. Although this has a benign, nonaggressive appearance, according to consensus guidelines follow-up imaging in 12 months with repeat pancreas protocol MRI is advised to reassess the enlarging cyst. Consensus guidelines also state that for patients 80 years or older the decision to pursue imaging follow-up should be anchored to a patient's overall health and preferences; such workup is only advised if the patient is a surgical candidate. This recommendation follows ACR consensus guidelines: Management of Incidental Pancreatic Cysts: A White Paper of the ACR Incidental Findings Committee. J Am Coll Radiol 2633;35:456-256. 2. Status post cholecystectomy.  Abdominal MRI 04/17/2020: 1. 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 No interval change from MRI 11/20/2018. As patient is now 81 years old, recommend 1 additional follow-up MRI in 24 months from current scan. This recommendation follows ACR consensus guidelines: Management of Incidental Pancreatic Cysts: A White Paper of the ACR Incidental Findings Committee. J Am Coll Radiol 3893;73:428-768. 2. Benign cysts of the kidneys (Bosniak 1).  MRI abdomen w / wo contrast 11/20/2018: 1. There are numerous cystic pancreatic lesions, some of which are minimally complicated. No solid components or suspicious enhancement demonstrated. Largest lesion in the pancreatic tail measures up to 19 mm in diameter. These are not clearly seen on previous noncontrast CT from 2012 and may be postinflammatory or reflect indolent cystic neoplasms. Per consensus guidelines, follow-up MRI in 6 months recommended to assess stability. This recommendation follows ACR consensus guidelines: Management of Incidental Pancreatic Cysts: A  White Paper of the ACR Incidental Findings Committee. Col[place] 1157;26:203-559. 2. No acute abdominal findings. 3. Small gallbladder polyps  GI PROCEDURES:  Colonoscopy 02/07/2015: 1. Mild diverticulosis was noted in the sigmoid colon 2. The examination was otherwise normal No further screening colonoscopies recommended due to age  Past Medical History:  Diagnosis Date   Abnormal EKG    Normal LV function in the past   Allergic rhinitis    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   Echocardiogram 6/22: 40 mm   Asthma    Bronchitis, mucopurulent recurrent (HCC)    Carotid artery disease (HCC)    a. mild by carotid duplex.   Cervical dysplasia 1971   Coronary artery disease    Diverticulosis of colon    DJD (degenerative joint disease)    Family history of cancer of extrahepatic bile  ducts 09/05/2020   Family history of colon cancer 09/05/2020   Family history of pancreatic cancer 09/05/2020   GERD (gastroesophageal reflux disease)    Headache(784.0)    History of kidney stones    Hypercholesterolemia    Hypertension    Interstitial cystitis    sees urologist   Lichen sclerosus    Malignant melanoma ([place]) 2006   sees Dr. [place] Crane in dermatology   Migraines    Mild aortic stenosis    Mitral valve disease    Question mitral valve prolapse in the past, no prolapse by echo 2009   Murmur 10/20/2015   SEES DR NELSON   OSA (obstructive sleep apnea)    USES DENTAL DEVICE   Osteoporosis    on fosomax > 5 years, stopped 11/2015   Pneumonia    SVT (supraventricular tachycardia) 02/15/2022   Monitor 02/2022: NSR, avg HR 61; 2 runs of NSVT (4 beats); several runs of Supraventricular Tachycardia (longest 3"11'); no AFib   Thyroid cyst    1 x 1.1 thyroid cyst noted on carotid Doppler, January, 2012   Venous insufficiency    Past Surgical History:  Procedure Laterality Date   BREAST BIOPSY Left 11/25/2011   U/S core, benign performed at [facility], [place]  2013   Breast Bx-Benign   CARDIAC CATHETERIZATION     CATARACT EXTRACTION, BILATERAL     CHOLECYSTECTOMY N/A 12/22/2019   Procedure: LAPAROSCOPIC CHOLECYSTECTOMY;  Surgeon: Ileana Roup, MD;  Location: [place];  Service: General;  Laterality: N/A;   cystoscopy and basket stone removal right ureter  02/2006   Dr. Robbi Garter SURGERY  12/22/2019   [place]   left total hip replacement  2004   Dr. Gladstone Lighter   melanoma removed from medial rleft knee area  2006   Dr. [place] Crane   NASAL SEPTUM SURGERY     TOTAL HIP ARTHROPLASTY Right 08/01/2021   Procedure: TOTAL HIP ARTHROPLASTY ANTERIOR APPROACH;  Surgeon: Gaynelle Arabian, MD;  Location: [facility];  Service: Orthopedics;  Laterality: Right;   Social History: She is married.  She is retired.  She has 1 son and 1 daughter.  Non-smoker.  No alcohol use.  No drug use.  Family History: family history includes Cancer (age of onset: 53) in her brother; Colon cancer in her paternal uncle; Diabetes in her brother, brother, and mother; Heart disease in her brother and mother; Hypertension in her brother, brother, sister, and sister; Lung cancer in her paternal uncle; Pancreatic cancer (age of onset: 38) in her father.  Allergies  Allergen Reactions   Celebrex [Celecoxib] Diarrhea   Cephalexin     Reaction was a high fever   Hydrochlorothiazide     hyponatremia   Penicillins Hives and Swelling    Swelling of arms & face Has patient had a PCN reaction causing immediate rash, facial/tongue/throat swelling, SOB or lightheadedness with hypotension: Yes Has patient had a PCN reaction causing severe rash involving mucus membranes or skin necrosis: No Has patient had a PCN reaction that required hospitalization: No Has patient had a PCN reaction occurring within the last 10 years: No If all of the above answers are "NO", then may proceed with Cephalosporin use.     Phenobarbital Hives   Statins     Muscle pain   Passion Fruit Flavor Diarrhea and Rash   Tape Rash    MEDICAL TAPE  Outpatient Encounter Medications as of 08/07/2022  Medication Sig   acyclovir (ZOVIRAX) 400 MG tablet Take 1 tablet (400 mg total) by mouth 5 (five) times daily as needed (fever blisters).   albuterol (PROAIR HFA) 108 (90 Base) MCG/ACT inhaler Inhale 2 puffs into the lungs every 6 (six) hours as needed for wheezing.   albuterol (PROVENTIL) (2.5 MG/3ML) 0.083% nebulizer solution Take 3 mLs (2.5 mg total) by nebulization every 6 (six) hours as needed.   alendronate (FOSAMAX) 70 MG tablet TAKE 1 TABLET EVERY 7 (SEVEN) DAYS. TAKE WITH A FULL GLASS OF WATER ON AN EMPTY STOMACH.   amLODipine (NORVASC) 10 MG tablet Take 1 tablet (10 mg total) by mouth daily.   azelastine (ASTELIN) 0.1 % nasal spray Instill 2 sprays into each nostril two times a day as needed for allergies   Bempedoic Acid (NEXLETOL) 180 MG TABS Take 1 tablet by mouth daily.   benzonatate (TESSALON) 100 MG capsule Take 1-2 capsules (100-200 mg total) by mouth 3 (three) times daily as needed for cough.   betamethasone dipropionate 0.05 % cream Apply 1 application topically 2 (two) times daily as needed (scaly skin).   budesonide-formoterol (SYMBICORT) 160-4.5 MCG/ACT inhaler Inhale 2 puffs into the lungs 2 (two) times daily.   Cholecalciferol 125 MCG (5000 UT) TABS Take 1 tablet by mouth daily.   desmopressin (DDAVP) 0.2 MG tablet Take 0.2 mg by mouth at bedtime.   EPINEPHrine 0.3 mg/0.3 mL IJ SOAJ injection Inject 0.3 mg into the muscle as needed for anaphylaxis.   ezetimibe (ZETIA) 10 MG tablet Take 1 tablet (10 mg total) by mouth daily.   fexofenadine (ALLEGRA) 180 MG tablet Take 180 mg by mouth daily.   furosemide (LASIX) 20 MG tablet Take 1 tablet (20 mg total) by mouth daily as needed for edema.   hydrALAZINE (APRESOLINE) 50 MG tablet TAKE 1 TAB 3 TIMES A DAY AND TAKE EXTRA TAB IF SBP>160   hydrOXYzine  (ATARAX/VISTARIL) 10 MG tablet Take 10 mg by mouth at bedtime.    losartan (COZAAR) 100 MG tablet TAKE 1 TABLET BY MOUTH EVERY DAY   METAMUCIL FIBER PO Take 1 capsule by mouth once a week.   metoprolol tartrate (LOPRESSOR) 25 MG tablet Take 12.5 mg by mouth daily. Take half a tablet daily   montelukast (SINGULAIR) 10 MG tablet Take 10 mg by mouth at bedtime.   omeprazole (PRILOSEC) 40 MG capsule TAKE 1 CAPSULE BY MOUTH EVERY DAY   pentosan polysulfate (ELMIRON) 100 MG capsule Take 100 mg by mouth daily.   polyethylene glycol (MIRALAX / GLYCOLAX) 17 g packet Take 17 g by mouth daily as needed for moderate constipation.   prazosin (MINIPRESS) 5 MG capsule *NEED OFFICE VISIT* TAKE 1 CAPSULE BY MOUTH TWICE A DAY   Probiotic Product (PROBIOTIC PO) Take 1 capsule by mouth daily.    No facility-administered encounter medications on file as of 08/07/2022.     REVIEW OF SYSTEMS:  Gen: + Fatigue. Denies fever, sweats or chills. + Intentional weight loss.  CV: Denies chest pain, palpitations or edema. Resp: Denies cough, shortness of breath of hemoptysis.  GI: See HPI. GU : + Interstitial cystitis, urine leakage.  MS: + Arthritis, back pain and bilateral knee pain R > L. Derm: Denies rash, itchiness, skin lesions or unhealing ulcers. Psych: Denies depression, anxiety or memory loss. Heme: Denies bruising, easy bleeding. Neuro:  Denies headaches, dizziness or paresthesias. Endo:  Denies any problems with DM, thyroid or adrenal function.  PHYSICAL EXAM:  BP 114/60   Pulse 65   Ht '5\' 2"'$  (1.575 m)   Wt 165 lb (74.8 kg)   BMI 30.18 kg/m   General: 81 year old female in no acute distress. Head: Normocephalic and atraumatic. Eyes:  Sclerae non-icteric, conjunctive pink. Ears: Normal auditory acuity. Mouth: Dentition intact. No ulcers or lesions.  Neck: Supple, no lymphadenopathy or thyromegaly.  Lungs: Clear bilaterally to auscultation without wheezes, crackles or rhonchi. Heart: Regular rate  and rhythm. 2/6 systolic murmur. No rub or gallop appreciated.  Abdomen: Soft, nontender, non distended. No masses. No hepatosplenomegaly. Normoactive bowel sounds x 4 quadrants.  Rectal: Deferred. Musculoskeletal: Symmetrical with no gross deformities. Skin: Warm and dry. No rash or lesions on visible extremities. Extremities: No edema. Neurological: Alert oriented x 4, no focal deficits.  Psychological:  Alert and cooperative. Normal mood and affect.  ASSESSMENT AND PLAN:  57) 81 year old female with nausea, generalized central abdominal pain and diarrhea, likely infectious diarrhea. Diarrhea typically does not occur with acute diverticulitis, however, her symptoms significantly improved after she took Cipro and Flagyl '500mg'$  po bid x 7 days then added Benefiber. 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 She passed a softly formed stool earlier today. Negative abdominal exam today. Hopefully she is starting to turn the corner.  Colonoscopy 02/2015 showed mild diverticulosis to the sigmoid colon, no polyps. -CBC and CMP -GI pathogen panel to be completed if loose stools recur -Continue Benefiber as tolerated for now -Keep well-hydrated, bland diet as tolerated -Patient to contact our office if diarrhea recurs  2) History of pancreatic cysts. Her most recent abdominal MRI 04/06/2022 showed multiple benign-appearing cysts scattered throughout the pancreas, 1 cyst arising off the body the pancreas has increased in size compared with previous exam measuring 1.5 cm on today's study versus 1.1 cm previously.  The remaining cysts were unchanged.  Father died from pancreatic cancer and brother died from biliary cancer.  She wishes to continue surveillance MRI imaging. -Dr. Fuller Plan to verify if repeat abdominal MRI/MRCP to be done in 6 months versus 12 months  3) GERD, stable   4) Mild AS  -Echo ordered by cardiology    CC:  Caprice Red, MD

## 2022-08-07 NOTE — Progress Notes (Signed)
Cardiology Office Note:    Date:  08/07/2022   ID:  Brianna Mccarty, DOB 1941-08-23, MRN [id number]  PCP:  Farrel Conners, MD   [facility] Providers Cardiologist:  Freada Bergeron, MD {   Referring MD: Caren Macadam, MD    History of Present Illness:    Brianna Mccarty is a 81 y.o. female with a hx of HTN, HLD, OSA and mild AS who was previously followed by Dr. Meda Coffee who now returns to clinic for follow-up.  TTE 04/2021 with LVEF 60-65%, no RWMA, mild concentric LVH, grade 2 DDAS, mild MR, mild AS, mild dilation of ascending aorta. Cardiac monitor that was obtained for palpitations in 01/2022 showed frequent SVT (1674 episodes) with longest lasting 35mn. She was recommended for metop but developed fatigue and this was decreased to 12.'5mg'$  daily.  Was last seen in clinic by EAmbrose Pancoast NP where her fatigue and palpitations had improved. She was willing to discuss PCSK9i at that time.   Today, the patient states that she had been struggling with her cholesterol medication due to intolerance of statins. Generally, she is compliant and does tolerate her regimen of Zetia and Bempedoic acid. She did not want to pursue injection therapy but may be open to it if we were able to get her on inclisiran.  In clinic her blood pressure is well controlled at 116/60.  She has not had time to walk on the treadmill as she usually does due to familial life stressors. However, she does want to return to her exercise routines soon.  She denies any chest pain, shortness of breath, or peripheral edema. No lightheadedness, headaches, syncope, orthopnea, or PND.  We reviewed the results of her prior echo and monitor results at length.  Also, she is struggling with some gastrointestinal issues. She is following with her GI.  Past Medical History:  Diagnosis Date   Abnormal EKG    Normal LV function in the past   Allergic rhinitis    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    Echocardiogram 6/22: 40 mm   Asthma    Bronchitis, mucopurulent recurrent (HCC)    Carotid artery disease (HCC)    a. mild by carotid duplex.   Cervical dysplasia 1971   Coronary artery disease    Diverticulosis of colon    DJD (degenerative joint disease)    Family history of cancer of extrahepatic bile ducts 09/05/2020   Family history of colon cancer 09/05/2020   Family history of pancreatic cancer 09/05/2020   GERD (gastroesophageal reflux disease)    Headache(784.0)    History of kidney stones    Hypercholesterolemia    Hypertension    Interstitial cystitis    sees urologist   Lichen sclerosus    Malignant melanoma ([facility] 2006   sees Dr. HN[place] Cranein dermatology   Migraines    Mild aortic stenosis    Mitral valve disease    Question mitral valve prolapse in the past, no prolapse by echo 2009   Murmur 10/20/2015   SEES DR NELSON   OSA (obstructive sleep apnea)    USES DENTAL DEVICE   Osteoporosis    on fosomax > 5 years, stopped 11/2015   Pneumonia    SVT (supraventricular tachycardia) 02/15/2022   Monitor 02/2022: NSR, avg HR 61; 2 runs of NSVT (4 beats); several runs of Supraventricular Tachycardia (longest 3"11'); no AFib   Thyroid cyst    1 x 1.1 thyroid cyst noted on carotid Doppler, January,  2012   Venous insufficiency     Past Surgical History:  Procedure Laterality Date   BREAST BIOPSY Left 11/25/2011   U/S core, benign performed at [facility], LEFT BREAT MARKER IN   BREAST CYST ASPIRATION     BREAST SURGERY  2013   Breast Bx-Benign   CARDIAC CATHETERIZATION     CATARACT EXTRACTION, BILATERAL     CHOLECYSTECTOMY N/A 12/22/2019   Procedure: LAPAROSCOPIC CHOLECYSTECTOMY;  Surgeon: Ileana Roup, MD;  Location: [place];  Service: General;  Laterality: N/A;   cystoscopy and basket stone removal right ureter  02/2006   Dr. Robbi Garter SURGERY  12/22/2019   [place]   left total hip replacement  2004   Dr. Gladstone Lighter    melanoma removed from medial rleft knee area  2006   Dr. [place] Crane   NASAL SEPTUM SURGERY     TOTAL HIP ARTHROPLASTY Right 08/01/2021   Procedure: [place];  Surgeon: Gaynelle Arabian, MD;  Location: [facility];  Service: Orthopedics;  Laterality: Right;    Current Medications: Current Meds  Medication Sig   acyclovir (ZOVIRAX) 400 MG tablet Take 1 tablet (400 mg total) by mouth 5 (five) times daily as needed (fever blisters).   albuterol (PROAIR HFA) 108 (90 Base) MCG/ACT inhaler Inhale 2 puffs into the lungs every 6 (six) hours as needed for wheezing.   albuterol (PROVENTIL) (2.5 MG/3ML) 0.083% nebulizer solution Take 3 mLs (2.5 mg total) by nebulization every 6 (six) hours as needed.   alendronate (FOSAMAX) 70 MG tablet TAKE 1 TABLET EVERY 7 (SEVEN) DAYS. TAKE WITH A FULL GLASS OF WATER ON AN EMPTY STOMACH.   amLODipine (NORVASC) 10 MG tablet Take 1 tablet (10 mg total) by mouth daily.   azelastine (ASTELIN) 0.1 % nasal spray Instill 2 sprays into each nostril two times a day as needed for allergies   Bempedoic Acid (NEXLETOL) 180 MG TABS Take 1 tablet by mouth daily.   benzonatate (TESSALON) 100 MG capsule Take 1-2 capsules (100-200 mg total) by mouth 3 (three) times daily as needed for cough.   betamethasone dipropionate 0.05 % cream Apply 1 application topically 2 (two) times daily as needed (scaly skin).   budesonide-formoterol (SYMBICORT) 160-4.5 MCG/ACT inhaler Inhale 2 puffs into the lungs 2 (two) times daily.   Cholecalciferol 125 MCG (5000 UT) TABS Take 1 tablet by mouth daily.   desmopressin (DDAVP) 0.2 MG tablet Take 0.2 mg by mouth at bedtime.   EPINEPHrine 0.3 mg/0.3 mL IJ SOAJ injection Inject 0.3 mg into the muscle as needed for anaphylaxis.   ezetimibe (ZETIA) 10 MG tablet Take 1 tablet (10 mg total) by mouth daily.   fexofenadine (ALLEGRA) 180 MG tablet Take 180 mg by mouth daily.   furosemide (LASIX) 20 MG tablet Take 1 tablet (20 mg total) by  mouth daily as needed for edema.   hydrALAZINE (APRESOLINE) 50 MG tablet TAKE 1 TAB 3 TIMES A DAY AND TAKE EXTRA TAB IF SBP>160   hydrOXYzine (ATARAX/VISTARIL) 10 MG tablet Take 10 mg by mouth at bedtime.    losartan (COZAAR) 100 MG tablet TAKE 1 TABLET BY MOUTH EVERY DAY   METAMUCIL FIBER PO Take 1 capsule by mouth once a week.   metoprolol tartrate (LOPRESSOR) 25 MG tablet Take 12.5 mg by mouth daily. Take half a tablet daily   montelukast (SINGULAIR) 10 MG tablet Take 10 mg by mouth at bedtime.   omeprazole (PRILOSEC) 40 MG capsule TAKE  1 CAPSULE BY MOUTH EVERY DAY   pentosan polysulfate (ELMIRON) 100 MG capsule Take 100 mg by mouth daily.   polyethylene glycol (MIRALAX / GLYCOLAX) 17 g packet Take 17 g by mouth daily as needed for moderate constipation.   prazosin (MINIPRESS) 5 MG capsule *NEED OFFICE VISIT* TAKE 1 CAPSULE BY MOUTH TWICE A DAY   Probiotic Product (PROBIOTIC PO) Take 1 capsule by mouth daily.      Allergies:   Celebrex [celecoxib], Cephalexin, Hydrochlorothiazide, Penicillins, Phenobarbital, Statins, Passion fruit flavor, and Tape   Social History   Socioeconomic History   Marital status: Married    Spouse name: Edison Nasuti   Number of children: 2   Years of education: Not on file   Highest education level: Not on file  Occupational History   Occupation: retired    Fish farm manager: RETIRED  Tobacco Use   Smoking status: Never   Smokeless tobacco: Never  Vaping Use   Vaping Use: Never used  Substance and Sexual Activity   Alcohol use: Yes    Alcohol/week: 1.0 standard drink of alcohol    Types: 1 Standard drinks or equivalent per week    Comment:  OCC WINE   Drug use: No   Sexual activity: Not Currently    Birth control/protection: Post-menopausal    Comment: 1st intercourse 34 yo-1 partner  Other Topics Concern   Not on file  Social History Narrative   Updated 12/13/2019   Work or School: none      Home Situation: lives with husband      Spiritual Beliefs:  christian      Lifestyle: regular exercise; healthy diet      12/13/2019: Uses treadmill 3/x weekly.    Looks after sister who has been having memory decline, as well  as husband with minor memory decline.    Enjoys writing poetry, reading, going to church, travel   Social Determinants of Health   Financial Resource Strain: Low Risk  (04/15/2022)   Overall Financial Resource Strain (CARDIA)    Difficulty of Paying Living Expenses: Not hard at all  Food Insecurity: No Food Insecurity (04/15/2022)   Hunger Vital Sign    Worried About Running Out of Food in the Last Year: Never true    Ran Out of Food in the Last Year: Never true  Transportation Needs: No Transportation Needs (04/15/2022)   PRAPARE - Hydrologist (Medical): No    Lack of Transportation (Non-Medical): No  Physical Activity: Inactive (04/15/2022)   Exercise Vital Sign    Days of Exercise per Week: 0 days    Minutes of Exercise per Session: 0 min  Stress: No Stress Concern Present (04/15/2022)   [place]    Feeling of Stress : Not at all  Social Connections: Moderately Integrated (03/27/2021)   Social Connection and Isolation Panel [NHANES]    Frequency of Communication with Friends and Family: More than three times a week    Frequency of Social Gatherings with Friends and Family: More than three times a week    Attends Religious Services: More than 4 times per year    Active Member of [employer] or Organizations: No    Attends Music therapist: Never    Marital Status: Married     Family History: The patient's family history includes Cancer (age of onset: 40) in her brother; Colon cancer in her paternal uncle; Diabetes in her brother, brother, and mother; Heart  disease in her brother and mother; Hypertension in her brother, brother, sister, and sister; Lung cancer in her paternal uncle; Pancreatic cancer (age of onset:  50) in her father.  ROS:   Please see the history of present illness.    Review of Systems  Constitutional:  Negative for chills and fever.  HENT:  Negative for hearing loss.   Eyes:  Negative for blurred vision.  Respiratory:  Negative for shortness of breath.   Cardiovascular:  Negative for chest pain, palpitations, orthopnea, claudication, leg swelling and PND.  Gastrointestinal:  Negative for melena, nausea and vomiting.  Genitourinary:  Negative for dysuria.  Musculoskeletal:  Positive for back pain, joint pain and myalgias.  Neurological:  Negative for dizziness and loss of consciousness.  Psychiatric/Behavioral:  Negative for substance abuse.     EKGs/Labs/Other Studies Reviewed:    The following studies were reviewed today:  CTA Chest/Aorta  04/24/2022: FINDINGS: Cardiovascular: Atherosclerosis of thoracic aorta is noted without aneurysm or dissection. Borderline cardiomegaly. No pericardial effusion.   Mediastinum/Nodes: No enlarged mediastinal, hilar, or axillary lymph nodes. Thyroid gland, trachea, and esophagus demonstrate no significant findings.   Lungs/Pleura: No pneumothorax or pleural effusion is noted. Stable 5 mm nodule is noted in right upper lobe best seen on image number 22 of series 5; this can be considered benign at this point with no further follow-up required. Stable 4 mm nodule is noted in right middle lobe which can be considered benign at this point.   Upper Abdomen: No acute abnormality.   Musculoskeletal: No chest wall abnormality. No acute or significant osseous findings.   Review of the MIP images confirms the above findings.   IMPRESSION: No evidence of thoracic aortic dissection or aneurysm.   Stable right lung nodules are noted which can be considered benign.   Aortic Atherosclerosis (ICD10-I70.0).  Cardiac Monitor 02/2022: Patch wear time was 14 days Predominant rhythm was NSR with average HR 61bpm (ranging from  40-218bpm) There were 2 runs of nonsustained VT with longest lasting 4 beats There were 1674 runs of SVT with longest lasting 11mn and 11 sec There were occasional SVE including couplets and triplets Rare VE (<1%) No Afib or significant pauses   Patch Wear Time:  14 days and 0 hours (2023-03-15T09:54:44-0400 to 2023-03-29T09:54:48-0400)   Patient had a min HR of 40 bpm, max HR of 218 bpm, and avg HR of 61 bpm. Predominant underlying rhythm was Sinus Rhythm. 2 Ventricular Tachycardia runs occurred, the run with the fastest interval lasting 4 beats with a max rate of 158 bpm, the longest  lasting 4 beats with an avg rate of 113 bpm. 1674 Supraventricular Tachycardia runs occurred, the run with the fastest interval lasting 8 beats with a max rate of 218 bpm, the longest lasting 3 mins 11 secs with an avg rate of 104 bpm. Isolated SVEs were  occasional (2.3%, 28550), SVE Couplets were occasional (1.0%, 6221), and SVE Triplets were occasional (1.1%, 4286). Isolated VEs were rare (<1.0%, 3110), VE Couplets were rare (<1.0%, 155), and VE Triplets were rare (<1.0%, 23). Ventricular Bigeminy and  Trigeminy were present.   Myoview 06/2021: The left ventricular ejection fraction is hyperdynamic (>65%). Nuclear stress EF: 74%. There was no ST segment deviation noted during stress. The study is normal. This is a low risk study.   1. Fixed apical inferior perfusion defect with normal wall motion, consistent with artifact 2. Low risk study  TTE 04/23/21: IMPRESSIONS   1. Left ventricular ejection fraction, by estimation,  is 60 to 65%. The  left ventricle has normal function. The left ventricle has no regional  wall motion abnormalities. There is mild concentric left ventricular  hypertrophy. Left ventricular diastolic  parameters are consistent with Grade II diastolic dysfunction  (pseudonormalization). Elevated left ventricular end-diastolic pressure.   2. Right ventricular systolic function is  normal. The right ventricular  size is normal. There is normal pulmonary artery systolic pressure.   3. Left atrial size was severely dilated.   4. Right atrial size was mildly dilated.   5. The mitral valve is abnormal. Mild mitral valve regurgitation. No  evidence of mitral stenosis. Severe mitral annular calcification.   6. The aortic valve is calcified. There is moderate calcification of the  aortic valve. There is moderate thickening of the aortic valve. Aortic  valve regurgitation is trivial. Mild aortic valve stenosis.   7. Aortic dilatation noted. There is mild dilatation of the ascending  aorta, measuring 40 mm.   8. The inferior vena cava is normal in size with greater than 50%  respiratory variability, suggesting right atrial pressure of 3 mmHg.  Echo 06/2018 Study Conclusions - Left ventricle: The cavity size was normal. Wall thickness was    normal. Systolic function was normal. The estimated ejection    fraction was in the range of 55% to 60%. Wall motion was normal;    there were no regional wall motion abnormalities. Doppler    parameters are consistent with abnormal left ventricular    relaxation (grade 1 diastolic dysfunction). Doppler parameters    are consistent with elevated mean left atrial filling pressure.  - Aortic valve: There was mild stenosis.  - Mitral valve: Severely calcified annulus. Mildly thickened    leaflets .  - Left atrium: The atrium was severely dilated.  - Atrial septum: No defect or patent foramen ovale was identified.   EKG:  EKG is personally reviewed. 08/07/2022:  EKG was not ordered. 04/04/2022 Rebekah Chesterfield., NP):  sinus bradycardia with left axis deviation with heart rate of 57 and no acute changes. 03/14/2021: NSR with HR 65.  Recent Labs: 03/20/2022: Hemoglobin 11.8; Platelets 221.0; TSH 1.94 04/04/2022: Magnesium 1.9 04/24/2022: BUN 16; Creatinine, Ser 0.84; Potassium 4.5; Sodium 140 07/26/2022: ALT 7   Recent Lipid Panel     Component Value Date/Time   CHOL 192 07/26/2022 1147   TRIG 35 07/26/2022 1147   HDL 76 07/26/2022 1147   CHOLHDL 2.5 07/26/2022 1147   CHOLHDL 3 11/09/2021 0938   VLDL 8.4 11/09/2021 0938   LDLCALC 109 (H) 07/26/2022 1147   LDLCALC 105 (H) 05/12/2020 1448   LDLDIRECT 126.6 07/18/2010 1045      Physical Exam:    VS:  BP 116/60   Pulse (!) 57   Ht '5\' 2"'$  (1.575 m)   Wt 165 lb 6.4 oz (75 kg)   SpO2 97%   BMI 30.25 kg/m     Wt Readings from Last 3 Encounters:  08/07/22 165 lb 6.4 oz (75 kg)  07/11/22 168 lb 12.8 oz (76.6 kg)  06/28/22 166 lb 12.8 oz (75.7 kg)     GEN:  Well nourished, well developed in no acute distress HEENT: Normal NECK: No JVD; No carotid bruits CARDIAC: RRR, no murmurs, rubs, gallops RESPIRATORY:  Clear to auscultation without rales, wheezing or rhonchi  ABDOMEN: Soft, non-tender, non-distended MUSCULOSKELETAL:  No edema; No deformity  SKIN: Warm and dry NEUROLOGIC:  Alert and oriented x 3 PSYCHIATRIC:  Normal affect   ASSESSMENT:  1. Palpitations   2. Ascending aorta dilation (HCC)   3. Mild ascending aorta dilatation (HCC)   4. Mild aortic stenosis   5. Dilation of aorta (HCC)   6. Hyperlipidemia, unspecified hyperlipidemia type   7. Essential hypertension   8. OSA (obstructive sleep apnea)     PLAN:    In order of problems listed above:  #Palpitations: #SVT: Cardiac monitor 02/2022 with 1674 runs of SVT and was started on low dose metop 12.'5mg'$  XL daily as could not toelrate higher doses due to fatigue. Currently, palpitations are well controlled. -Continue metop 12.'5mg'$  XL daily  #HTN: Well controlled and at goal <130/90. -Continue amlodipine '10mg'$  daily -Continue hydralazine '50mg'$  TID -Continue losartan '100mg'$  daily -Continue lasix '20mg'$  daily  #OSA: -Has dentures which has helped  #Mild AS: #Ascending aorta dilation. TTE 04/2021 with mean gardient 18mHg, AVA 1.4cm2. Aorta 476m-Continue serial monitoring yearly due to  dilated aorta  #HLD: Intolerant of statins. More open to idea of inclisiran. Will look into coverage -Continue Nexletol '180mg'$  daily -Continue zetia '10mg'$  daily  Follow-up:  6 months.  Medication Adjustments/Labs and Tests Ordered: Current medicines are reviewed at length with the patient today.  Concerns regarding medicines are outlined above.   Orders Placed This Encounter  Procedures   ECHOCARDIOGRAM COMPLETE   No orders of the defined types were placed in this encounter.  Patient Instructions  Medication Instructions:   Your physician recommends that you continue on your current medications as directed. Please refer to the Current Medication list given to you today.  *If you need a refill on your cardiac medications before your next appointment, please call your pharmacy*   Testing/Procedures:  Your physician has requested that you have an echocardiogram. Echocardiography is a painless test that uses sound waves to create images of your heart. It provides your doctor with information about the size and shape of your heart and how well your heart's chambers and valves are working. This procedure takes approximately one hour. There are no restrictions for this procedure.   Follow-Up: [facility] your [facility] needs are our [facility].  As part of our continuing mission to provide you with exceptional heart care, we have created designated Provider Care Teams.  These Care Teams include your primary Cardiologist (physician) and Advanced Practice Providers (APPs -  Physician Assistants and Nurse Practitioners) who all work together to provide you with the care you need, when you need it.  We recommend signing up for the patient portal called "MyChart".  Sign up information is provided on this After Visit Summary.  MyChart is used to connect with patients for Virtual Visits (Telemedicine).  Patients are able to view lab/test results, encounter notes, upcoming  appointments, etc.  Non-urgent messages can be sent to your provider as well.   To learn more about what you can do with MyChart, go to [email or web address]   Your next appointment:   6 month(s)  The format for your next appointment:   In Person  Provider:   HeFreada BergeronMD      Important Information About Sugar        I,Mathew Stumpf,acting as a scribe for HeFreada BergeronMD.,have documented all relevant documentation on the behalf of HeFreada BergeronMD,as directed by  HeFreada BergeronMD while in the presence of HeFreada BergeronMD.  I, HeFreada BergeronMD, have reviewed all documentation for this visit. The documentation on 08/07/22 for the exam, diagnosis,  procedures, and orders are all accurate and complete.    Signed, Freada Bergeron, MD  08/07/2022 1:02 PM    [place]

## 2022-08-07 NOTE — Patient Instructions (Addendum)
Your provider has requested that you go to the basement level for lab work before leaving today. Press "B" on the elevator. The lab is located at the first door on the left as you exit the elevator.  Complete the GI pathogen panel stool test next time you pass a loose stool  Continue bland diet   Continue Benefiber 1 tablespoon once daily   Contact our office if your diarrhea recurs or if your abdominal pain worsens   The [facility] providers would like to encourage you to use [facility] to communicate with providers for non-urgent requests or questions.  Due to long hold times on the telephone, sending your provider a message by [facility] may be a faster and more efficient way to get a response.  Please allow 48 business hours for a response.  Please remember that this is for non-urgent requests.   Due to recent changes in healthcare laws, you may see the results of your imaging and laboratory studies on MyChart before your provider has had a chance to review them.  We understand that in some cases there may be results that are confusing or concerning to you. Not all laboratory results come back in the same time frame and the provider may be waiting for multiple results in order to interpret others.  Please give [place] 48 hours in order for your provider to thoroughly review all the results before contacting the office for clarification of your results.

## 2022-08-08 DIAGNOSIS — H35341 Macular cyst, hole, or pseudohole, right eye: Secondary | ICD-10-CM | POA: Diagnosis not present

## 2022-08-08 DIAGNOSIS — H35373 Puckering of macula, bilateral: Secondary | ICD-10-CM | POA: Diagnosis not present

## 2022-08-08 DIAGNOSIS — H40013 Open angle with borderline findings, low risk, bilateral: Secondary | ICD-10-CM | POA: Diagnosis not present

## 2022-08-09 ENCOUNTER — Other Ambulatory Visit: Payer: PPO

## 2022-08-09 DIAGNOSIS — J3089 Other allergic rhinitis: Secondary | ICD-10-CM | POA: Diagnosis not present

## 2022-08-09 DIAGNOSIS — J301 Allergic rhinitis due to pollen: Secondary | ICD-10-CM | POA: Diagnosis not present

## 2022-08-09 DIAGNOSIS — A09 Infectious gastroenteritis and colitis, unspecified: Secondary | ICD-10-CM

## 2022-08-12 ENCOUNTER — Telehealth: Payer: Self-pay | Admitting: Family Medicine

## 2022-08-12 LAB — GASTROINTESTINAL PATHOGEN PNL
CampyloBacter Group: NOT DETECTED
Norovirus GI/GII: NOT DETECTED
Rotavirus A: NOT DETECTED
Salmonella species: NOT DETECTED
Shiga Toxin 1: NOT DETECTED
Shiga Toxin 2: NOT DETECTED
Shigella Species: NOT DETECTED
Vibrio Group: NOT DETECTED
Yersinia enterocolitica: NOT DETECTED

## 2022-08-12 NOTE — Telephone Encounter (Signed)
Patient informed of the message below.

## 2022-08-12 NOTE — Telephone Encounter (Signed)
Pt is calling and would like joanne to return her call concerning a conversation they had regarding image test that she had a slight blockage

## 2022-08-12 NOTE — Telephone Encounter (Signed)
Spoke with the patient for more information.  Patient stated she was seen by a PA at the GI office and told her she recalls being told by me that she had a restriction or blockage and cannot recall the location.  She stated the PA looked at her records, could not find this and requested she contact our office to let her know what test revealed this.  After reviewing the chart, I see a phone note from 07/18/2022 in which Dr Legrand Como wanted to know how she was doing, stated the x-ray reveals a small loop of bowel and she was concerned for a small bowel obstruction.  Message sent to PCP as I could not locate x-ray in which this was indicated.

## 2022-08-12 NOTE — Telephone Encounter (Signed)
Yes I had done the x-ray but it was just a dilated loop of bowel. If she is doing well, then she does not need any further work up at this time. I read the GI note also.

## 2022-08-14 DIAGNOSIS — J3089 Other allergic rhinitis: Secondary | ICD-10-CM | POA: Diagnosis not present

## 2022-08-14 DIAGNOSIS — J301 Allergic rhinitis due to pollen: Secondary | ICD-10-CM | POA: Diagnosis not present

## 2022-08-16 DIAGNOSIS — J301 Allergic rhinitis due to pollen: Secondary | ICD-10-CM | POA: Diagnosis not present

## 2022-08-16 DIAGNOSIS — J3081 Allergic rhinitis due to animal (cat) (dog) hair and dander: Secondary | ICD-10-CM | POA: Diagnosis not present

## 2022-08-16 DIAGNOSIS — J3089 Other allergic rhinitis: Secondary | ICD-10-CM | POA: Diagnosis not present

## 2022-08-20 ENCOUNTER — Other Ambulatory Visit: Payer: Self-pay

## 2022-08-20 DIAGNOSIS — A09 Infectious gastroenteritis and colitis, unspecified: Secondary | ICD-10-CM

## 2022-08-22 ENCOUNTER — Ambulatory Visit (HOSPITAL_COMMUNITY): Payer: PPO | Attending: Cardiology

## 2022-08-22 DIAGNOSIS — I350 Nonrheumatic aortic (valve) stenosis: Secondary | ICD-10-CM

## 2022-08-22 DIAGNOSIS — I77810 Thoracic aortic ectasia: Secondary | ICD-10-CM

## 2022-08-22 DIAGNOSIS — I77819 Aortic ectasia, unspecified site: Secondary | ICD-10-CM

## 2022-08-22 LAB — ECHOCARDIOGRAM COMPLETE
AR max vel: 1.69 cm2
AV Area VTI: 1.71 cm2
AV Area mean vel: 1.63 cm2
AV Mean grad: 18.4 mmHg
AV Peak grad: 31.8 mmHg
Ao pk vel: 2.82 m/s
Area-P 1/2: 2.6 cm2
MV M vel: 5.62 m/s
MV Peak grad: 126.3 mmHg
Radius: 0.5 cm
S' Lateral: 3.1 cm

## 2022-08-23 ENCOUNTER — Telehealth: Payer: Self-pay | Admitting: *Deleted

## 2022-08-23 DIAGNOSIS — I77810 Thoracic aortic ectasia: Secondary | ICD-10-CM

## 2022-08-23 DIAGNOSIS — I340 Nonrheumatic mitral (valve) insufficiency: Secondary | ICD-10-CM

## 2022-08-23 DIAGNOSIS — I350 Nonrheumatic aortic (valve) stenosis: Secondary | ICD-10-CM

## 2022-08-23 DIAGNOSIS — M25512 Pain in left shoulder: Secondary | ICD-10-CM | POA: Diagnosis not present

## 2022-08-23 DIAGNOSIS — I071 Rheumatic tricuspid insufficiency: Secondary | ICD-10-CM

## 2022-08-23 DIAGNOSIS — J301 Allergic rhinitis due to pollen: Secondary | ICD-10-CM | POA: Diagnosis not present

## 2022-08-23 DIAGNOSIS — I77819 Aortic ectasia, unspecified site: Secondary | ICD-10-CM

## 2022-08-23 DIAGNOSIS — J3089 Other allergic rhinitis: Secondary | ICD-10-CM | POA: Diagnosis not present

## 2022-08-23 NOTE — Telephone Encounter (Signed)
The patient has been notified of the result and verbalized understanding.  All questions (if any) were answered.  Pt aware that I will go ahead and place the order for repeat echo in one year in the system and send a message to our [facility] Scheduler to call her back to arrange this appt.   Pt verbalized understanding and agrees with this plan.

## 2022-08-23 NOTE — Telephone Encounter (Signed)
-----   Message from Freada Bergeron, MD sent at 08/22/2022  8:39 PM EDT ----- Her echo shows normal pumping function. She continues to have mild narrowing of her aortic valve. She also has mild to moderate leakiness of the mitral valve and moderate leakiness of the tricuspid valve. Her aorta also remains mildly dilated. We will continue to monitor this yearly with repeat ultrasounds. This should not be causing symptoms but we just want to keep a close eye on things going forward.

## 2022-08-28 DIAGNOSIS — J3089 Other allergic rhinitis: Secondary | ICD-10-CM | POA: Diagnosis not present

## 2022-08-28 DIAGNOSIS — J301 Allergic rhinitis due to pollen: Secondary | ICD-10-CM | POA: Diagnosis not present

## 2022-08-28 DIAGNOSIS — J3081 Allergic rhinitis due to animal (cat) (dog) hair and dander: Secondary | ICD-10-CM | POA: Diagnosis not present

## 2022-09-02 ENCOUNTER — Telehealth: Payer: Self-pay | Admitting: *Deleted

## 2022-09-02 ENCOUNTER — Encounter: Payer: Self-pay | Admitting: *Deleted

## 2022-09-02 NOTE — Patient Instructions (Signed)
Visit Information  Thank you for taking time to visit with me today. Please don't hesitate to contact me if I can be of assistance to you.   Following are the goals we discussed today:   Goals Addressed               This Visit's Progress     COMPLETED: No needs (pt-stated)        Care Coordination Interventions: Reviewed medications with patient and discussed adherence with no needed refills Reviewed scheduled/upcoming provider appointments including pending appointments Screening for signs and symptoms of depression related to chronic disease state  Assessed social determinant of health barriers         Please call the care guide team at [phone] if you need to cancel or reschedule your appointment.   If you are experiencing a Mental Health or [place] or need someone to talk to, please call the Suicide and Crisis Lifeline: 988  The patient verbalized understanding of instructions, educational materials, and care plan provided today and DECLINED offer to receive copy of patient instructions, educational materials, and care plan.   No further follow up required: No needs  Raina Mina, RN Care Management Coordinator [place] Office [phone]

## 2022-09-02 NOTE — Patient Outreach (Signed)
  Care Coordination   Initial Visit Note   09/02/2022 Name: Brianna Mccarty MRN: [id number] DOB: 1941-04-26  Brianna Mccarty is a 81 y.o. year old female who sees Brianna Conners, MD for primary care. I spoke with  Brianna Mccarty by phone today.  What matters to the patients health and wellness today?  No needs    Goals Addressed               This Visit's Progress     COMPLETED: No needs (pt-stated)        Care Coordination Interventions: Reviewed medications with patient and discussed adherence with no needed refills Reviewed scheduled/upcoming provider appointments including pending appointments Screening for signs and symptoms of depression related to chronic disease state  Assessed social determinant of health barriers         SDOH assessments and interventions completed:  Yes  SDOH Interventions Today    Flowsheet Row Most Recent Value  SDOH Interventions   Food Insecurity Interventions Intervention Not Indicated  Housing Interventions Intervention Not Indicated  Transportation Interventions Intervention Not Indicated  Utilities Interventions Intervention Not Indicated        Care Coordination Interventions Activated:  Yes  Care Coordination Interventions:  Yes, provided   Follow up plan: No further intervention required.   Encounter Outcome:  Pt. Visit Completed   Brianna Mina, RN Care Management Coordinator [place] Office [phone]

## 2022-09-03 DIAGNOSIS — M48061 Spinal stenosis, lumbar region without neurogenic claudication: Secondary | ICD-10-CM | POA: Diagnosis not present

## 2022-09-03 DIAGNOSIS — J3089 Other allergic rhinitis: Secondary | ICD-10-CM | POA: Diagnosis not present

## 2022-09-03 DIAGNOSIS — J3081 Allergic rhinitis due to animal (cat) (dog) hair and dander: Secondary | ICD-10-CM | POA: Diagnosis not present

## 2022-09-03 DIAGNOSIS — Z6830 Body mass index (BMI) 30.0-30.9, adult: Secondary | ICD-10-CM | POA: Diagnosis not present

## 2022-09-03 DIAGNOSIS — J301 Allergic rhinitis due to pollen: Secondary | ICD-10-CM | POA: Diagnosis not present

## 2022-09-09 ENCOUNTER — Other Ambulatory Visit: Payer: Self-pay | Admitting: *Deleted

## 2022-09-09 MED ORDER — OMEPRAZOLE 40 MG PO CPDR: 40.0000 mg | DELAYED_RELEASE_CAPSULE | Freq: Every day | ORAL | 0 refills | Status: DC

## 2022-09-11 ENCOUNTER — Other Ambulatory Visit: Payer: Self-pay | Admitting: *Deleted

## 2022-09-11 DIAGNOSIS — J3081 Allergic rhinitis due to animal (cat) (dog) hair and dander: Secondary | ICD-10-CM | POA: Diagnosis not present

## 2022-09-11 DIAGNOSIS — J3089 Other allergic rhinitis: Secondary | ICD-10-CM | POA: Diagnosis not present

## 2022-09-11 DIAGNOSIS — J301 Allergic rhinitis due to pollen: Secondary | ICD-10-CM | POA: Diagnosis not present

## 2022-09-11 MED ORDER — LOSARTAN POTASSIUM 100 MG PO TABS
100.0000 mg | ORAL_TABLET | Freq: Every day | ORAL | 1 refills | Status: DC
Start: 2022-09-11 — End: 2023-03-07

## 2022-09-13 ENCOUNTER — Other Ambulatory Visit: Payer: Self-pay

## 2022-09-13 MED ORDER — HYDRALAZINE HCL 50 MG PO TABS: ORAL_TABLET | ORAL | 3 refills | Status: DC

## 2022-09-16 IMAGING — CR DG CHEST 2V
2 series · 2 of 2 positions shown · non-contrast
Comparison: 08/14/2020.

CLINICAL DATA: Chest pain and shortness of breath.

EXAM:
CHEST - 2 VIEW

[chest pa]
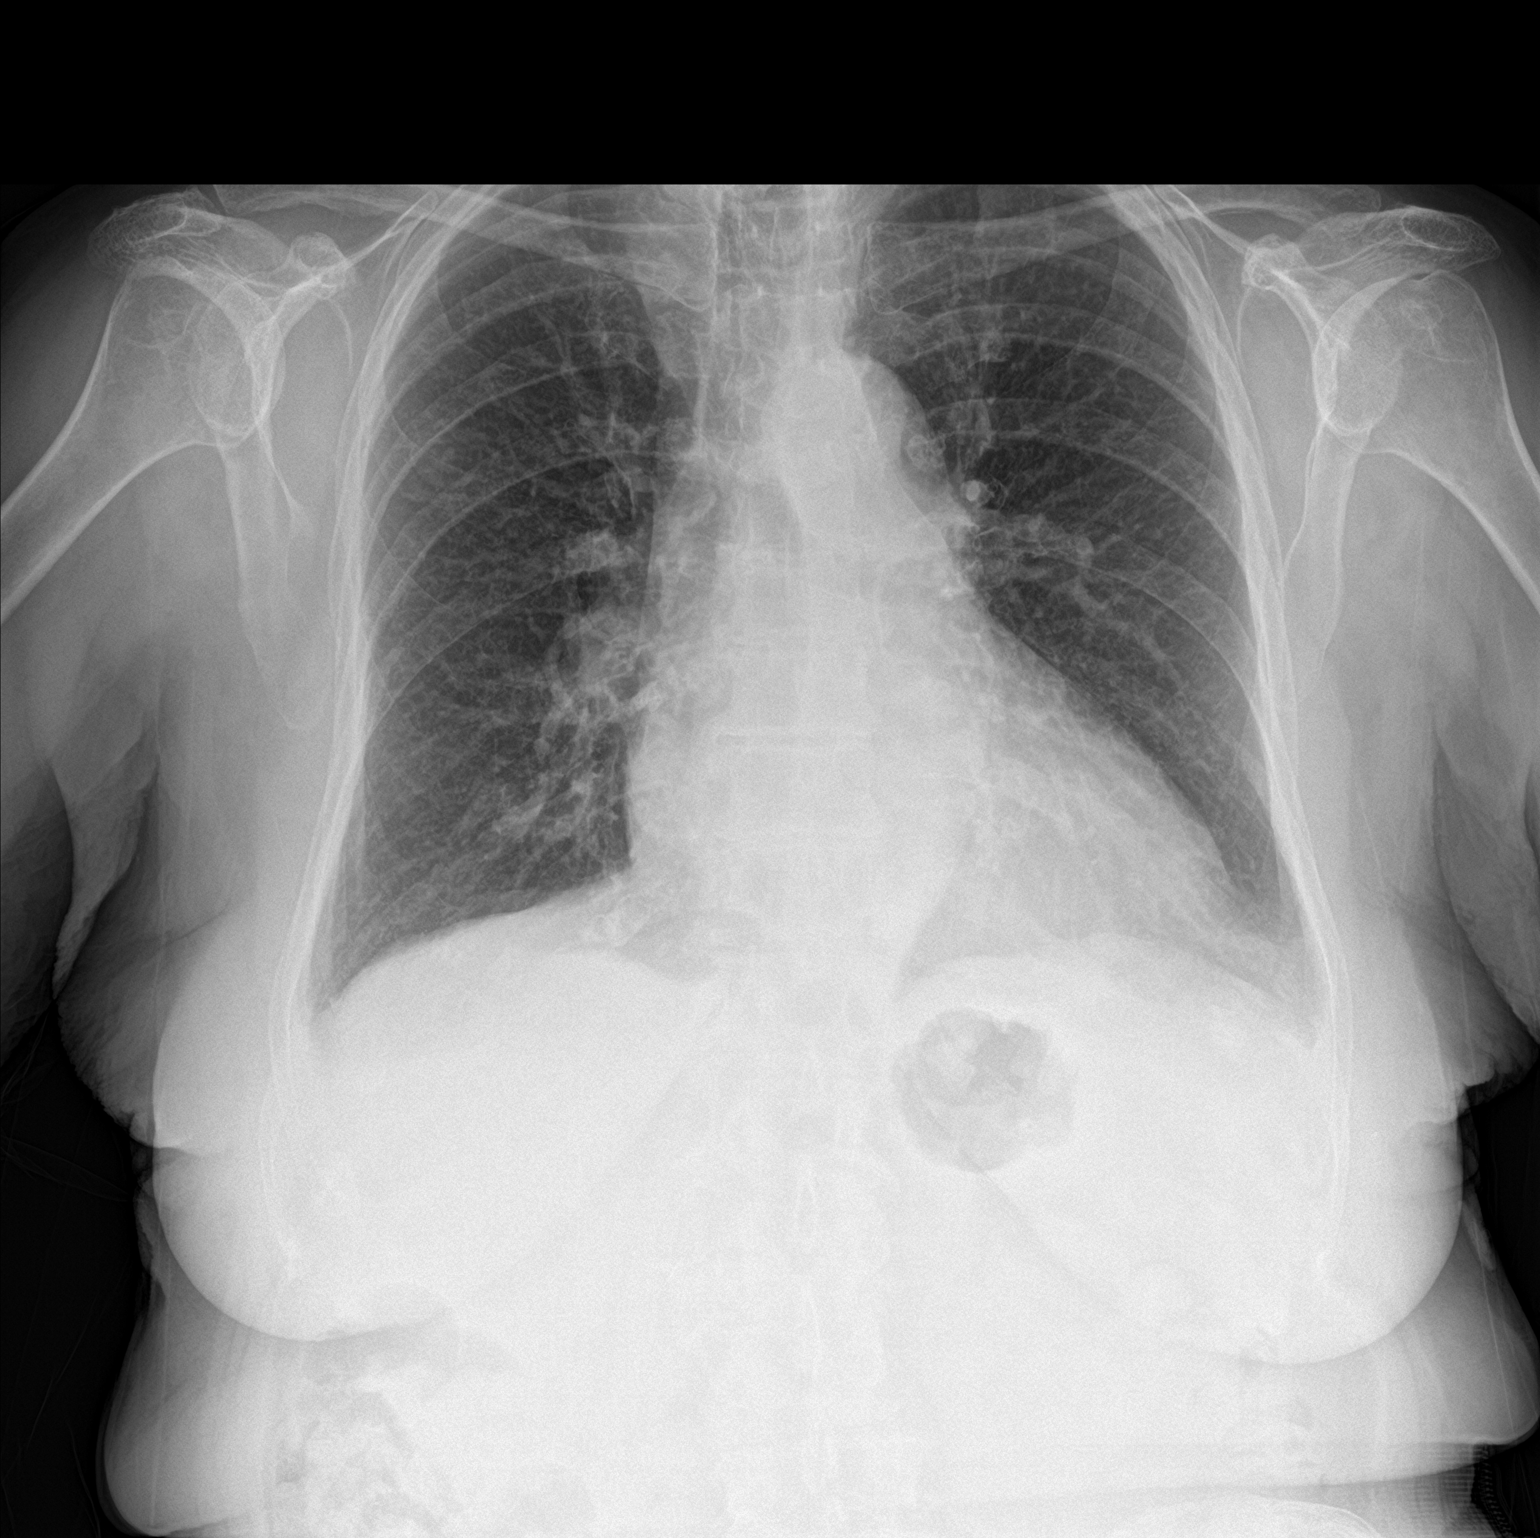

[chest lat]
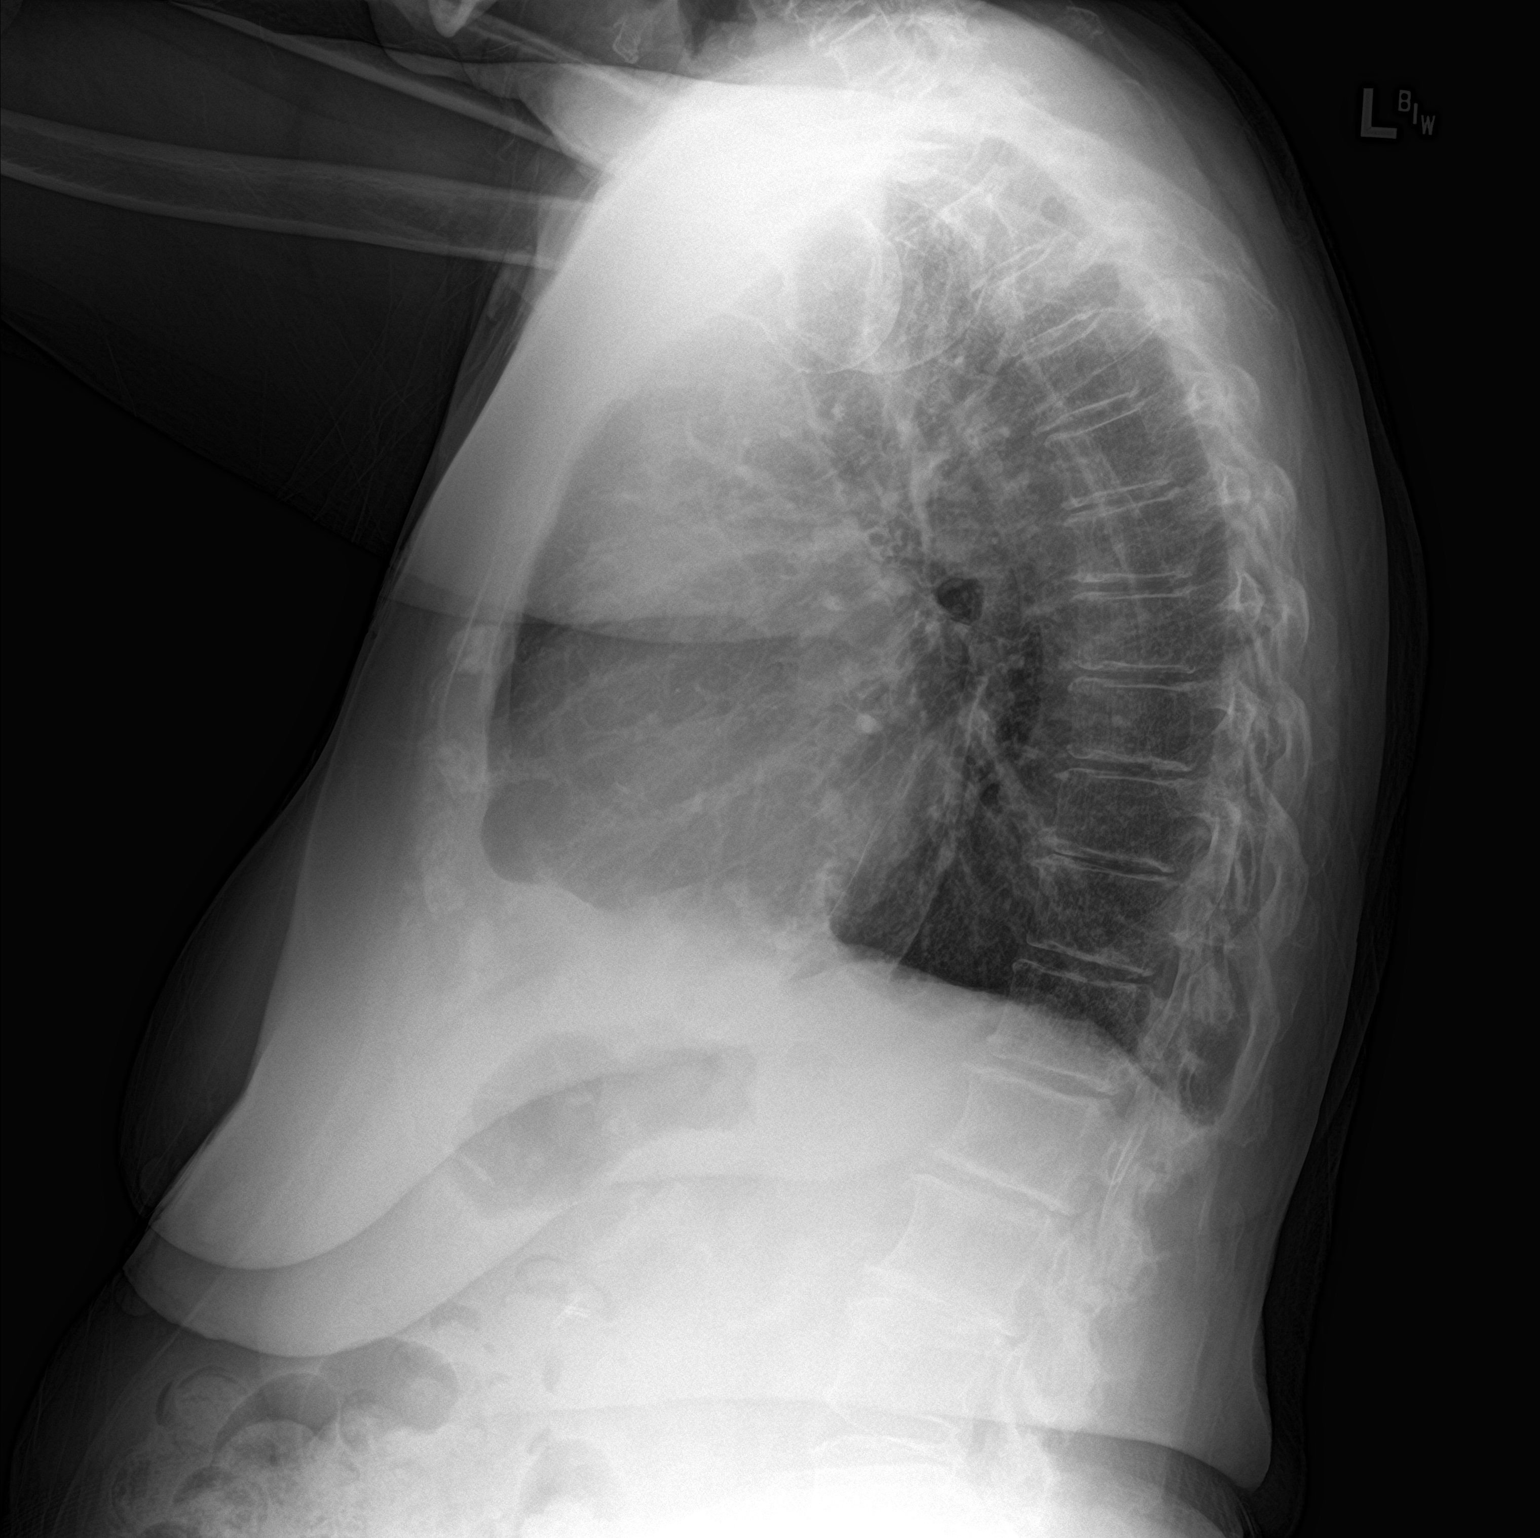

[2 of 2 positions shown; findings below may reference images not displayed]

FINDINGS: Mild enlargement of the cardiac silhouette, stable. No mediastinal
or hilar masses. No evidence of adenopathy.

Lungs are hyperexpanded, but clear.

No pleural effusion or pneumothorax.

Skeletal structures are intact.
IMPRESSION: No acute cardiopulmonary disease.

## 2022-09-19 DIAGNOSIS — J301 Allergic rhinitis due to pollen: Secondary | ICD-10-CM | POA: Diagnosis not present

## 2022-09-19 DIAGNOSIS — J3081 Allergic rhinitis due to animal (cat) (dog) hair and dander: Secondary | ICD-10-CM | POA: Diagnosis not present

## 2022-09-19 DIAGNOSIS — J3089 Other allergic rhinitis: Secondary | ICD-10-CM | POA: Diagnosis not present

## 2022-09-20 ENCOUNTER — Ambulatory Visit: Payer: PPO | Attending: Cardiovascular Disease | Admitting: Student

## 2022-09-20 DIAGNOSIS — E7800 Pure hypercholesterolemia, unspecified: Secondary | ICD-10-CM | POA: Diagnosis not present

## 2022-09-20 NOTE — Progress Notes (Unsigned)
Patient ID: Brianna Mccarty                 DOB: 23-Nov-1940                    MRN: [id number]      HPI: Brianna Mccarty is a 81 y.o. female patient referred to lipid clinic by Dr.Pemberton. PMH is significant for hypertension, HDL, CVD, venous insufficiency, Asthma, allergy, osteoporosis.   Today patient is in to get her lipid medication optimized. Recent LDLc is elevated. Patient has stopped taking Nexletol as the grant expired she was not able to afford the co- pay. She has not been taking only ezetimibe since September. Patient reports taking various statins in the past which was giving her excruciating pain. Does not recall the name of the all statins that she tried but she had tried various does of each. We discuss various other lipid lowering drugs (PCSK9i, Leqvio, Nexletol). Patient does not have good part D plan but has good part B plan. And does not like self injection so prefers Leqvio.     Current Medications: ezetimibe 10 mg  Intolerances: pravastatin 20 mg Simvastatin 20 mg- excruciating pain  Risk Factors: family hx of premature CVD and MI, age, hypertension, CVD LDL goal: <70 mg/dl   Diet: generally eats health balanced diet, no red meat only chicken,fish which is grilled and baked. Does not eat sweets and very little bread or carb containing food  Exercise: treadmill 30 min 3-4 times week   Family History: mother- heart diseases,diabetes, father- pancreatic cancer, T2DM Sister and brother- heart diseases,diabetes  Social History:  Alcohol -none Tobacco - Never  Labs: Lipid Panel     Component Value Date/Time   CHOL 192 07/26/2022 1147   TRIG 35 07/26/2022 1147   HDL 76 07/26/2022 1147   CHOLHDL 2.5 07/26/2022 1147   CHOLHDL 3 11/09/2021 0938   VLDL 8.4 11/09/2021 0938   LDLCALC 109 (H) 07/26/2022 1147   Triana 105 (H) 05/12/2020 1448   LDLDIRECT 126.6 07/18/2010 1045   LABVLDL 7 07/26/2022 1147    Past Medical History:  Diagnosis Date   Abnormal EKG     Normal LV function in the past   Allergic rhinitis    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 mild by carotid duplex.   Cervical dysplasia 1971   Coronary artery disease    Diverticulosis of colon    DJD (degenerative joint disease)    Family history of cancer of extrahepatic bile ducts 09/05/2020   Family history of colon cancer 09/05/2020   Family history of pancreatic cancer 09/05/2020   GERD (gastroesophageal reflux disease)    Headache(784.0)    History of kidney stones    Hypercholesterolemia    Hypertension    Interstitial cystitis    sees urologist   Lichen sclerosus    Malignant melanoma ([place]) 2006   sees Dr. [place] Crane in dermatology   Migraines    Mild aortic stenosis    Mitral valve disease    Question mitral valve prolapse in the past, no prolapse by echo 2009   Murmur 10/20/2015   SEES DR NELSON   OSA (obstructive sleep apnea)    USES DENTAL DEVICE   Osteoporosis    on fosomax > 5 years, stopped 11/2015   Pneumonia    SVT (  supraventricular tachycardia) 02/15/2022   Monitor 02/2022: NSR, avg HR 61; 2 runs of NSVT (4 beats); several runs of Supraventricular Tachycardia (longest 3"11'); no AFib   Thyroid cyst    1 x 1.1 thyroid cyst noted on carotid Doppler, January, 2012   Venous insufficiency     Current Outpatient Medications on File Prior to Visit  Medication Sig Dispense Refill   acyclovir (ZOVIRAX) 400 MG tablet Take 1 tablet (400 mg total) by mouth 5 (five) times daily as needed (fever blisters). 30 tablet 2   albuterol (PROAIR HFA) 108 (90 Base) MCG/ACT inhaler Inhale 2 puffs into the lungs every 6 (six) hours as needed for wheezing. 3 Inhaler 3   albuterol (PROVENTIL) (2.5 MG/3ML) 0.083% nebulizer solution Take 3 mLs (2.5 mg total) by nebulization every 6 (six) hours as needed. 360 mL 3   alendronate (FOSAMAX) 70 MG tablet TAKE 1 TABLET  EVERY 7 (SEVEN) DAYS. TAKE WITH A FULL GLASS OF WATER ON AN EMPTY STOMACH. 12 tablet 3   amLODipine (NORVASC) 10 MG tablet Take 1 tablet (10 mg total) by mouth daily. 90 tablet 1   azelastine (ASTELIN) 0.1 % nasal spray Instill 2 sprays into each nostril two times a day as needed for allergies 30 mL 0   Bempedoic Acid (NEXLETOL) 180 MG TABS Take 1 tablet by mouth daily. 90 tablet 3   benzonatate (TESSALON) 100 MG capsule Take 1-2 capsules (100-200 mg total) by mouth 3 (three) times daily as needed for cough. 60 capsule 0   betamethasone dipropionate 0.05 % cream Apply 1 application topically 2 (two) times daily as needed (scaly skin).     budesonide-formoterol (SYMBICORT) 160-4.5 MCG/ACT inhaler Inhale 2 puffs into the lungs 2 (two) times daily. 3 each 3   Cholecalciferol 125 MCG (5000 UT) TABS Take 1 tablet by mouth daily.     desmopressin (DDAVP) 0.2 MG tablet Take 0.2 mg by mouth at bedtime.     EPINEPHrine 0.3 mg/0.3 mL IJ SOAJ injection Inject 0.3 mg into the muscle as needed for anaphylaxis.     ezetimibe (ZETIA) 10 MG tablet Take 1 tablet (10 mg total) by mouth daily. 90 tablet 3   fexofenadine (ALLEGRA) 180 MG tablet Take 180 mg by mouth daily.     furosemide (LASIX) 20 MG tablet Take 1 tablet (20 mg total) by mouth daily as needed for edema. 30 tablet 3   hydrALAZINE (APRESOLINE) 50 MG tablet TAKE 1 TAB 3 TIMES A DAY AND TAKE EXTRA TAB IF SBP>160 315 tablet 3   hydrOXYzine (ATARAX/VISTARIL) 10 MG tablet Take 10 mg by mouth at bedtime.      losartan (COZAAR) 100 MG tablet Take 1 tablet (100 mg total) by mouth daily. 90 tablet 1   METAMUCIL FIBER PO Take 1 capsule by mouth once a week.     metoprolol tartrate (LOPRESSOR) 25 MG tablet Take 12.5 mg by mouth daily. Take half a tablet daily     montelukast (SINGULAIR) 10 MG tablet Take 10 mg by mouth at bedtime.     omeprazole (PRILOSEC) 40 MG capsule Take 1 capsule (40 mg total) by mouth daily. 90 capsule 0   pentosan polysulfate (ELMIRON)  100 MG capsule Take 100 mg by mouth daily.     prazosin (MINIPRESS) 5 MG capsule *NEED OFFICE VISIT* TAKE 1 CAPSULE BY MOUTH TWICE A DAY 180 capsule 1   Probiotic Product (PROBIOTIC PO) Take 1 capsule by mouth daily.      No current facility-administered medications  on file prior to visit.    Allergies  Allergen Reactions   Celebrex [Celecoxib] Diarrhea   Cephalexin     Reaction was a high fever   Hydrochlorothiazide     hyponatremia   Penicillins Hives and Swelling    Swelling of arms & face Has patient had a PCN reaction causing immediate rash, facial/tongue/throat swelling, SOB or lightheadedness with hypotension: Yes Has patient had a PCN reaction causing severe rash involving mucus membranes or skin necrosis: No Has patient had a PCN reaction that required hospitalization: No Has patient had a PCN reaction occurring within the last 10 years: No If all of the above answers are "NO", then may proceed with Cephalosporin use.    Phenobarbital Hives   Statins     Muscle pain   Passion Fruit Flavor Diarrhea and Rash   Tape Rash    MEDICAL TAPE      Pure hypercholesterolemia Assessment/Plan LDLc elevated 109 mg/dl (07/26/2022) Currently only on ezetimibe, stopped taking Nexletol since September 2023 due to high co-pay; grant expired Patient eats generally low fat,low carb balanced diet and do regular exercise  Patient does not want any self injected medication  Plan : Leqvio start form has been submitted  Continue taking ezetimibe 10 mg daily    Thank you,  Cammy Copa, Pharm.D [facility] of [facility] [address] [place], [place] [address]  Phone: [phone]; Fax: [phone]

## 2022-09-21 DIAGNOSIS — M5451 Vertebrogenic low back pain: Secondary | ICD-10-CM | POA: Diagnosis not present

## 2022-09-22 NOTE — Assessment & Plan Note (Signed)
Assessment/Plan LDLc elevated 109 mg/dl (07/26/2022) Currently only on ezetimibe, stopped taking Nexletol since September 2023 due to high co-pay; grant expired Patient eats generally low fat,low carb balanced diet and do regular exercise  Patient does not want any self injected medication  Plan : Leqvio start form has been submitted  Continue taking ezetimibe 10 mg daily

## 2022-09-30 ENCOUNTER — Ambulatory Visit: Payer: PPO | Admitting: Nurse Practitioner

## 2022-09-30 ENCOUNTER — Telehealth: Payer: Self-pay | Admitting: Pharmacist

## 2022-09-30 DIAGNOSIS — J301 Allergic rhinitis due to pollen: Secondary | ICD-10-CM | POA: Diagnosis not present

## 2022-09-30 DIAGNOSIS — J3081 Allergic rhinitis due to animal (cat) (dog) hair and dander: Secondary | ICD-10-CM | POA: Diagnosis not present

## 2022-09-30 DIAGNOSIS — J3089 Other allergic rhinitis: Secondary | ICD-10-CM | POA: Diagnosis not present

## 2022-09-30 NOTE — Progress Notes (Signed)
Chronic Care Management Pharmacy Assistant   Name: Brianna Mccarty  MRN: [id number] DOB: 03/14/1941   09/30/22 APPOINTMENT REMINDER   Called Patient No answer, left message of appointment on 10/01/22 at 11 via telephone visit with Jeni Salles, Pharm D.   Notified to have all medications, supplements, blood pressure and/or blood sugar logs available during appointment and to return call if need to reschedule.     Care Gaps: COVID Booster - Overdue Flu Vaccine - Postponed BP- 114/60 08/07/22 AWV- 04/15/22   Star Rating Drug: Losartan 100 mg - Last filled 09/11/22 90 DS    Medications: Outpatient Encounter Medications as of 09/30/2022  Medication Sig   acyclovir (ZOVIRAX) 400 MG tablet Take 1 tablet (400 mg total) by mouth 5 (five) times daily as needed (fever blisters).   albuterol (PROAIR HFA) 108 (90 Base) MCG/ACT inhaler Inhale 2 puffs into the lungs every 6 (six) hours as needed for wheezing.   albuterol (PROVENTIL) (2.5 MG/3ML) 0.083% nebulizer solution Take 3 mLs (2.5 mg total) by nebulization every 6 (six) hours as needed.   alendronate (FOSAMAX) 70 MG tablet TAKE 1 TABLET EVERY 7 (SEVEN) DAYS. TAKE WITH A FULL GLASS OF WATER ON AN EMPTY STOMACH.   amLODipine (NORVASC) 10 MG tablet Take 1 tablet (10 mg total) by mouth daily.   azelastine (ASTELIN) 0.1 % nasal spray Instill 2 sprays into each nostril two times a day as needed for allergies   Bempedoic Acid (NEXLETOL) 180 MG TABS Take 1 tablet by mouth daily.   benzonatate (TESSALON) 100 MG capsule Take 1-2 capsules (100-200 mg total) by mouth 3 (three) times daily as needed for cough.   betamethasone dipropionate 0.05 % cream Apply 1 application topically 2 (two) times daily as needed (scaly skin).   budesonide-formoterol (SYMBICORT) 160-4.5 MCG/ACT inhaler Inhale 2 puffs into the lungs 2 (two) times daily.   Cholecalciferol 125 MCG (5000 UT) TABS Take 1 tablet by mouth daily.   desmopressin (DDAVP) 0.2 MG tablet Take  0.2 mg by mouth at bedtime.   EPINEPHrine 0.3 mg/0.3 mL IJ SOAJ injection Inject 0.3 mg into the muscle as needed for anaphylaxis.   ezetimibe (ZETIA) 10 MG tablet Take 1 tablet (10 mg total) by mouth daily.   fexofenadine (ALLEGRA) 180 MG tablet Take 180 mg by mouth daily.   furosemide (LASIX) 20 MG tablet Take 1 tablet (20 mg total) by mouth daily as needed for edema.   hydrALAZINE (APRESOLINE) 50 MG tablet TAKE 1 TAB 3 TIMES A DAY AND TAKE EXTRA TAB IF SBP>160   hydrOXYzine (ATARAX/VISTARIL) 10 MG tablet Take 10 mg by mouth at bedtime.    losartan (COZAAR) 100 MG tablet Take 1 tablet (100 mg total) by mouth daily.   METAMUCIL FIBER PO Take 1 capsule by mouth once a week.   metoprolol tartrate (LOPRESSOR) 25 MG tablet Take 12.5 mg by mouth daily. Take half a tablet daily   montelukast (SINGULAIR) 10 MG tablet Take 10 mg by mouth at bedtime.   omeprazole (PRILOSEC) 40 MG capsule Take 1 capsule (40 mg total) by mouth daily.   pentosan polysulfate (ELMIRON) 100 MG capsule Take 100 mg by mouth daily.   prazosin (MINIPRESS) 5 MG capsule *NEED OFFICE VISIT* TAKE 1 CAPSULE BY MOUTH TWICE A DAY   Probiotic Product (PROBIOTIC PO) Take 1 capsule by mouth daily.    No facility-administered encounter medications on file as of 09/30/2022.      [place] Clinical Pharmacist Assistant [phone]

## 2022-09-30 NOTE — Progress Notes (Deleted)
     09/30/2022 Brianna Mccarty [id number] 09/16/1941   Chief Complaint:  History of Present Illness: : Brianna Mccarty. [place] is an 81 year old female with a past medical history of asthma, OSA uses dental device, hypertension, hypercholesterolemia, mild aortic stenosis, SVT, ascending aortic dilatation, malignant melanoma 2006, interstitial cystitis, pancreatic cysts per MRI, GERD and diverticulosis.  Past right and left hip replacement surgery. She has known by Dr. Fuller Plan.     GI pathogen panel 08/09/2022 was negative.  Follow up labs due today     Current Medications, Allergies, Past Medical History, Past Surgical History, Family History and Social History were reviewed in [employer] record.   Review of Systems:   Constitutional: Negative for fever, sweats, chills or weight loss.  Respiratory: Negative for shortness of breath.   Cardiovascular: Negative for chest pain, palpitations and leg swelling.  Gastrointestinal: See HPI.  Musculoskeletal: Negative for back pain or muscle aches.  Neurological: Negative for dizziness, headaches or paresthesias.    Physical Exam: There were no vitals taken for this visit. General: Well developed, w   ***female in no acute distress. Head: Normocephalic and atraumatic. Eyes: No scleral icterus. Conjunctiva pink . Ears: Normal auditory acuity. Mouth: Dentition intact. No ulcers or lesions.  Lungs: Clear throughout to auscultation. Heart: Regular rate and rhythm, no murmur. Abdomen: Soft, nontender and nondistended. No masses or hepatomegaly. Normal bowel sounds x 4 quadrants.  Rectal: *** Musculoskeletal: Symmetrical with no gross deformities. Extremities: No edema. Neurological: Alert oriented x 4. No focal deficits.  Psychological: Alert and cooperative. Normal mood and affect  Assessment and Recommendations: ***

## 2022-10-01 ENCOUNTER — Ambulatory Visit (INDEPENDENT_AMBULATORY_CARE_PROVIDER_SITE_OTHER): Payer: PPO | Admitting: Pharmacist

## 2022-10-01 DIAGNOSIS — I10 Essential (primary) hypertension: Secondary | ICD-10-CM

## 2022-10-01 DIAGNOSIS — Z6831 Body mass index (BMI) 31.0-31.9, adult: Secondary | ICD-10-CM | POA: Diagnosis not present

## 2022-10-01 DIAGNOSIS — J4530 Mild persistent asthma, uncomplicated: Secondary | ICD-10-CM

## 2022-10-01 DIAGNOSIS — M48061 Spinal stenosis, lumbar region without neurogenic claudication: Secondary | ICD-10-CM | POA: Diagnosis not present

## 2022-10-01 DIAGNOSIS — M810 Age-related osteoporosis without current pathological fracture: Secondary | ICD-10-CM

## 2022-10-01 NOTE — Addendum Note (Signed)
Addended by: Farrel Conners on: 10/01/2022 03:34 PM   Modules accepted: Orders

## 2022-10-01 NOTE — Progress Notes (Signed)
Chronic Care Management Pharmacy Note  10/01/2022 Name:  KATHYRN WARMUTH MRN:  [id number] DOB:  1941/04/22  Summary: BP at goal < 140/90 per most home readings Pt is overdue for DEXA scan  Recommendations/Changes made from today's visit: -Recommended calling insurance company about coverage of Symbicort generic before switching to -Recommend repeat DEXA scan and vitamin D level  Plan: Patient to follow up pending change in inhaler BP assessment in 3 months Follow up in 6 months  Subjective: ANTONIETTE PEAKE is an 81 y.o. year old female who is a primary patient of Legrand Como, Royston Cowper, MD.  The CCM team was consulted for assistance with disease management and care coordination needs.    Engaged with patient by telephone for follow up visit in response to provider referral for pharmacy case management and/or care coordination services.   Consent to Services:  The patient was given information about Chronic Care Management services, agreed to services, and gave verbal consent prior to initiation of services.  Please see initial visit note for detailed documentation.   Patient Care Team: Farrel Conners, MD as PCP - General (Family Medicine) Freada Bergeron, MD as PCP - Cardiology (Cardiology) Dorothy Spark, MD as Consulting Physician (Cardiology) Ladene Artist, MD as Consulting Physician (Gastroenterology) Festus Aloe, MD as Consulting Physician (Urology) [name], Darrick Grinder, MD as Consulting Physician (Allergy and Immunology) Monna Fam, MD as Consulting Physician (Ophthalmology) [name], MD (Dermatology) [name], MD as Consulting Physician (Orthopedic Surgery) Viona Gilmore, [facility] as Pharmacist (Pharmacist)  Recent office visits: 07/11/22 Loralyn Freshwater, MD: Patient presented for abdominal pain. Prescribed Cipro, Flagyl and Zofran.   06/28/22 Loralyn Freshwater, MD: Patient presented to establish care. Follow up in 9 months.  04/15/22  [name], LPN - Patient presented for Medicare Annual Wellness exam. Patient Voiced Goal of Losing weight and regular exercise.   Recent consult visits: 09/21/22 [facility]): Patient presented for pain in lumbar spine. Unable to access notes.  09/20/22 Cammy Copa , [facility] (cardiology): Patient presented for hyperlipidemia. Plan to start [place]. Continue taking Zetia 10 mg daily.   08/07/22 Carl Best, NP (gastroenterology): Patient presented for diarrhea. Plan for repeat labs in 6 weeks.  08/07/22 Gwyndolyn Kaufman, MD (cardiology): Patient presented for follow up.   07/02/22 [name], MD (Allergy): Patient presented for Allergic rhinitis due to pollen. Unable to access notes.  05/17/22 - [name], MD Patient presented to [facility] for Acute left shoulder subacromial bursitis and other concerns. Administered Corticosteroid injection. Prescribed Methocarbamol.   05/14/22 [name] Patient presented for Allergic Rhinitis and other concerns.   6/21//23 - Patient presented to [facility] for CT Angio Chest Aorta W/CM & OR   04/17/22 [name], [name]  - Patient presented for Allergic rhinitis due to pollen and other concerns. No medication Changes.   04/12/2022 [name], [name]  - Patient presented for Allergic rhinitis due to pollen and other concerns. No medication Changes.   04/06/2022 Patient presented to [place] for MR ABD W/WO CM/MRCP-GI.   04/04/2022 [name]., NP (Cardiology) - Patient presented for Palpitations and other concerns. Stopped Fluconazole 150 mg.    04/03/2022 [name] - Patient presented for Allergic rhinitis due to pollen and other concerns. No medication changes.  Hospital visits: Medication Reconciliation was completed by comparing discharge summary, patient's EMR and Pharmacy list, and upon discussion with patient.   Patient presented to [facility] ED  on  05/17/2022 due to Acute pain of left shoulder. Patient was present for 1 hour.   New?Medications Started at [facility] Discharge:?? -started  oxyCODONE-acetaminophen 5-325 MG   Medication Changes at Hospital Discharge: -Changed  none   Medications Discontinued at Hospital Discharge: -Stopped  none   Medications that remain the same after Hospital Discharge:??  -All other medications will remain the same.   Objective:  Lab Results  Component Value Date   CREATININE 0.80 08/07/2022   BUN 15 08/07/2022   GFR 68.47 08/07/2022   GFRNONAA >60 10/21/2021   GFRAA 84 07/24/2020   NA 139 08/07/2022   K 3.8 08/07/2022   CALCIUM 9.2 08/07/2022   CO2 29 08/07/2022   GLUCOSE 132 (H) 08/07/2022    Lab Results  Component Value Date/Time   HGBA1C 5.9 03/20/2022 03:32 PM   HGBA1C 5.9 11/09/2021 09:38 AM   GFR 68.47 08/07/2022 03:19 PM   GFR 43.51 (L) 03/20/2022 03:32 PM    Last diabetic Eye exam:  Lab Results  Component Value Date/Time   HMDIABEYEEXA No Retinopathy 07/10/2016 12:00 AM    Last diabetic Foot exam: No results found for: "HMDIABFOOTEX"   Lab Results  Component Value Date   CHOL 192 07/26/2022   HDL 76 07/26/2022   LDLCALC 109 (H) 07/26/2022   LDLDIRECT 126.6 07/18/2010   TRIG 35 07/26/2022   CHOLHDL 2.5 07/26/2022       Latest Ref Rng & Units 08/07/2022    3:19 PM 07/26/2022   11:47 AM 04/24/2022    7:24 AM  Hepatic Function  Total Protein 6.0 - 8.3 g/dL 6.1  6.3  5.7   Albumin 3.5 - 5.2 g/dL 3.9  4.4  4.1   AST 0 - 37 U/L _0 ALT 0 - 35 U/L _1 Alk Phosphatase 39 - 117 U/L 31  36  39   Total Bilirubin 0.2 - 1.2 mg/dL 0.3  0.3  0.3   Bilirubin, Direct 0.00 - 0.40 mg/dL  0.13      Lab Results  Component Value Date/Time   TSH 1.94 03/20/2022 03:32 PM   TSH 0.97 11/09/2021 09:38 AM       Latest Ref Rng & Units 08/07/2022    3:19 PM 03/20/2022    3:32 PM 10/21/2021    8:51 AM  CBC  WBC 4.0 - 10.5 K/uL 4.9  8.5  7.3   Hemoglobin 12.0 -  15.0 g/dL 11.4  11.8  12.3   Hematocrit 36.0 - 46.0 % 33.3  34.9  37.9   Platelets 150.0 - 400.0 K/uL 216.0  221.0  244     Lab Results  Component Value Date/Time   VD25OH 29.28 (L) 11/09/2021 09:38 AM   VD25OH 31 05/12/2020 02:48 PM   VD25OH 36.7 05/31/2019 09:52 AM    Clinical ASCVD: No  The ASCVD Risk score (Arnett DK, et al., 2019) failed to calculate for the following reasons:   The 2019 ASCVD risk score is only valid for ages 25 to 67       09/02/2022    2:15 PM 06/28/2022   12:58 PM 04/15/2022    2:04 PM  Depression screen PHQ 2/9  Decreased Interest 0 0 0  Down, Depressed, Hopeless 0 0 0  PHQ - 2 Score 0 0 0  Altered sleeping  0   Tired, decreased energy  1   Change in appetite  0   Feeling bad or  failure about yourself   0   Trouble concentrating  0   Moving slowly or fidgety/restless  0   Suicidal thoughts  0   PHQ-9 Score  1   Difficult doing work/chores  Not difficult at all      Social History   Tobacco Use  Smoking Status Never  Smokeless Tobacco Never   BP Readings from Last 3 Encounters:  08/07/22 114/60  08/07/22 116/60  07/11/22 (!) 140/70   Pulse Readings from Last 3 Encounters:  08/07/22 65  08/07/22 (!) 57  07/11/22 65   Wt Readings from Last 3 Encounters:  08/07/22 165 lb (74.8 kg)  08/07/22 165 lb 6.4 oz (75 kg)  07/11/22 168 lb 12.8 oz (76.6 kg)   BMI Readings from Last 3 Encounters:  08/07/22 30.18 kg/m  08/07/22 30.25 kg/m  07/11/22 30.87 kg/m    Assessment/Interventions: Review of patient past medical history, allergies, medications, health status, including review of consultants reports, laboratory and other test data, was performed as part of comprehensive evaluation and provision of chronic care management services.   SDOH:  (Social Determinants of Health) assessments and interventions performed: Yes  SDOH Interventions    Flowsheet Row Chronic Care Management from 10/01/2022 in [place] [facility] [place] from 09/02/2022 in [place] Management from 10/01/2021 in [place] [facility] [place] from 12/08/2018 in [place] [facility] [place] Interventions -- Intervention Not Indicated -- --  Housing Interventions -- Intervention Not Indicated -- --  Transportation Interventions -- Intervention Not Indicated Intervention Not Indicated --  Utilities Interventions -- Intervention Not Indicated -- --  Depression Interventions/Treatment  -- -- -- PHQ2-9 Score <4 Follow-up Not Indicated  Financial Strain Interventions Intervention Not Indicated -- Other (Comment)  [working on PAP] --       SDOH Screenings   Food Insecurity: No Food Insecurity (09/02/2022)  Housing: Low Risk  (09/02/2022)  Transportation Needs: No Transportation Needs (09/02/2022)  Utilities: Not At Risk (09/02/2022)  Depression (PHQ2-9): Low Risk  (09/02/2022)  Financial Resource Strain: Low Risk  (10/01/2022)  Physical Activity: Inactive (04/15/2022)  Social Connections: Moderately Integrated (03/27/2021)  Stress: No Stress Concern Present (04/15/2022)  Tobacco Use: Low Risk  (09/02/2022)   CCM Care Plan  Allergies  Allergen Reactions   Celebrex [Celecoxib] Diarrhea   Cephalexin     Reaction was a high fever   Hydrochlorothiazide     hyponatremia   Penicillins Hives and Swelling    Swelling of arms & face Has patient had a PCN reaction causing immediate rash, facial/tongue/throat swelling, SOB or lightheadedness with hypotension: Yes Has patient had a PCN reaction causing severe rash involving mucus membranes or skin necrosis: No Has patient had a PCN reaction that required hospitalization: No Has patient had a PCN reaction occurring within the last 10 years: No If all of the above answers are "NO", then may proceed with Cephalosporin use.    Phenobarbital Hives   Statins     Muscle pain    Passion Fruit Flavor Diarrhea and Rash   Tape Rash    MEDICAL TAPE    Medications Reviewed Today     Reviewed by Viona Gilmore, [facility] (Pharmacist) on 10/01/22 at 1107  Med List Status: <None>   Medication Order Taking? Sig Documenting Provider Last Dose Status Informant  acyclovir (ZOVIRAX) 400 MG tablet [id number] No Take 1 tablet (400 mg total) by mouth  5 (five) times daily as needed (fever blisters). [name], NP Taking Active   albuterol [facility] HFA) 108 (90 Base) MCG/ACT inhaler [id number] No Inhale 2 puffs into the lungs every 6 (six) hours as needed for wheezing. Noralee Space, MD Taking Active Self  albuterol (PROVENTIL) (2.5 MG/3ML) 0.083% nebulizer solution [id number] No Take 3 mLs (2.5 mg total) by nebulization every 6 (six) hours as needed. Noralee Space, MD Taking Active Self  alendronate (FOSAMAX) 70 MG tablet [id number] No TAKE 1 TABLET EVERY 7 (SEVEN) DAYS. TAKE WITH A FULL GLASS OF WATER ON AN EMPTY STOMACH. Caren Macadam, MD Taking Active Self           Med Note Constance Haw, Tommy Rainwater   Thu Apr 04, 2022  3:07 PM)    amLODipine (NORVASC) 10 MG tablet [id number] No Take 1 tablet (10 mg total) by mouth daily. Caren Macadam, MD Taking Active   azelastine (ASTELIN) 0.1 % nasal spray [id number] No Instill 2 sprays into each nostril two times a day as needed for allergies Scot Jun, FNP Taking Active Self  Bempedoic Acid (NEXLETOL) 180 MG TABS [id number] No Take 1 tablet by mouth daily. Freada Bergeron, MD Taking Active   Discontinued 10/01/22 1106 (Completed Course)   betamethasone dipropionate 0.05 % cream [id number] No Apply 1 application topically 2 (two) times daily as needed (scaly skin). [provider] Taking Active Self  budesonide-formoterol (SYMBICORT) 160-4.5 MCG/ACT inhaler [id number] No Inhale 2 puffs into the lungs 2 (two) times daily. Caren Macadam, MD Taking Active   Cholecalciferol 125 MCG (5000 UT) TABS [id number] No Take  1 tablet by mouth daily. [provider] Taking Active   desmopressin (DDAVP) 0.2 MG tablet [id number] No Take 0.2 mg by mouth at bedtime. [provider] Taking Active Self  EPINEPHrine 0.3 mg/0.3 mL IJ SOAJ injection [id number] No Inject 0.3 mg into the muscle as needed for anaphylaxis. [provider] Taking Active Self  ezetimibe (ZETIA) 10 MG tablet [id number] No Take 1 tablet (10 mg total) by mouth daily. Richardson Dopp T, PA-C Taking Active   fexofenadine (ALLEGRA) 180 MG tablet [id number] No Take 180 mg by mouth daily. [provider] Taking Active Self  furosemide (LASIX) 20 MG tablet [id number] No Take 1 tablet (20 mg total) by mouth daily as needed for edema. Freada Bergeron, MD Taking Active   hydrALAZINE (APRESOLINE) 50 MG tablet [id number]  TAKE 1 TAB 3 TIMES A DAY AND TAKE EXTRA TAB IF SBP>160 Freada Bergeron, MD  Active   hydrOXYzine (ATARAX/VISTARIL) 10 MG tablet [id number] No Take 10 mg by mouth at bedtime.  [provider] Taking Active Self  losartan (COZAAR) 100 MG tablet [id number]  Take 1 tablet (100 mg total) by mouth daily. Farrel Conners, MD  Active   METAMUCIL FIBER PO [id number] No Take 1 capsule by mouth once a week. [provider] Taking Active Self  metoprolol tartrate (LOPRESSOR) 25 MG tablet [id number] No Take 12.5 mg by mouth daily. Take half a tablet daily [provider] Taking Active Self  montelukast (SINGULAIR) 10 MG tablet [id number] No Take 10 mg by mouth at bedtime. [provider] Taking Active Self  omeprazole (PRILOSEC) 40 MG capsule [id number]  Take 1 capsule (40 mg total) by mouth daily. Farrel Conners, MD  Active   pentosan polysulfate (ELMIRON) 100 MG capsule [id number] No Take 100 mg by mouth daily. [provider] Taking Active Self  prazosin (  MINIPRESS) 5 MG capsule [id number] No *NEED OFFICE VISIT* TAKE 1 CAPSULE BY MOUTH TWICE A DAY Farrel Conners, MD Taking  Active   Probiotic Product (PROBIOTIC PO) [id number] No Take 1 capsule by mouth daily.  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   Genetic testing 09/13/2020   Family history of pancreatic cancer 09/05/2020   Family history of colon cancer 09/05/2020   Family history of cancer of extrahepatic bile ducts 09/05/2020   Fever 07/26/2020   History of COVID-19 07/26/2020   History of melanoma 05/12/2020   Macular degeneration 02/17/2019   OSA (obstructive sleep apnea) 02/12/2018   Prediabetes 02/12/2018   H/O cold sores 11/27/2015   Murmur 10/20/2015   Carotid artery disease (HCC)    Thyroid cyst    Venous (peripheral) insufficiency 06/02/2009   Asthma 04/27/2008   Melanoma of skin ([place]) 02/10/2008   Allergic rhinitis 02/10/2008   INTERSTITIAL CYSTITIS 02/10/2008   Pure hypercholesterolemia 02/09/2008   Essential hypertension 02/09/2008   GERD 02/09/2008   Osteoporosis 02/09/2008    Immunization History  Administered Date(s) Administered   Fluad Quad(high Dose 65+) 07/02/2019   Influenza Split 09/18/2011, 08/11/2012, 07/28/2014   Influenza Whole 07/21/2008, 08/04/2009, 08/01/2010   Influenza, High Dose Seasonal PF 07/19/2013, 07/28/2015, 10/17/2016, 08/11/2017, 09/01/2017, 08/04/2018, 09/11/2018, 09/01/2019, 06/30/2021, 08/01/2022   Influenza,inj,Quad PF,6+ Mos 07/03/2016   Moderna Covid-19 Vaccine Bivalent Booster 60yr & up 09/24/2021   PFIZER(Purple Top)SARS-COV-2 Vaccination 11/20/2019, 12/08/2019, 03/22/2020   Pneumococcal Conjugate-13 12/06/2013   Pneumococcal Polysaccharide-23 10/03/2008, 10/17/2016, 11/24/2017, 09/11/2018,  07/12/2019, 09/01/2019   Tdap 11/04/2008, 10/24/2015   Zoster Recombinat (Shingrix) 07/30/2019, 09/03/2019, 02/21/2020   Zoster, Live 11/05/2003   Patient feels like she is "falling apart" and she is going back to the back doctor later today for follow up from a scan. She has back pain and a lot of knee pain which gets worse with the change in weather.   Patient has been busy and stressed lately [place]. She has been helping with managing her doctor's appointments.  Patient is not feeling as fatigued as she was before. Patient is sleeping pretty well too overall.   Patient reports her BP this morning was 127/60 HR 65 and oxygen was 96%. Patient reports sometimes her pulse is high 50s and doesn't feel much difference. Patient denies dizziness and lightheadedness.   Patient did receive a denial letter for Symbicort for next year for assistance. She was unaware there was a generic and is going to ask her insurance company about coverage. She knows BMemory Danceis also preferred so if she needs to switch, she can.  Conditions to be addressed/monitored:  Hypertension, Hyperlipidemia, Asthma, Osteoporosis, Osteoarthritis, and Allergic Rhinitis  Conditions addressed this visit: Hypertension, asthma  Care Plan : CCM Pharmacy Care Plan  Updates made by PViona Gilmore RHawkinsvillesince 10/01/2022 12:00 AM     Problem: Problem: Hypertension, Hyperlipidemia, Asthma, Osteoporosis, Osteoarthritis, and Allergic Rhinitis      Long-Range Goal: Patient-Specific Goal   Start Date: 10/01/2021  Expected End Date: 10/01/2022  Recent Progress: On track  Priority:  High  Note:   Current Barriers:  Unable to independently monitor therapeutic efficacy  Pharmacist Clinical Goal(s):  Patient will achieve adherence to monitoring guidelines and medication adherence to achieve therapeutic efficacy through collaboration with PharmD and provider.    Interventions: 1:1 collaboration with Caren Macadam, MD regarding development and update of comprehensive plan of care as evidenced by provider attestation and co-signature Inter-disciplinary care team collaboration (see longitudinal plan of care) Comprehensive medication review performed; medication list updated in electronic medical record  Hypertension (BP goal <140/90) -Controlled -Current treatment: Amlodipine 10 mg 1 tablet daily - Appropriate, Effective, Safe, Accessible Hydralazine 50 mg 1 tablet three times daily - taking one in morning and one at night - Appropriate, Effective, Safe, Accessible Losartan 100 mg 1 tablet daily  - Appropriate, Effective, Safe, Accessible Prazosin 5 mg one in the morning and one at night - Appropriate, Effective, Query Safe, Accessible -Medications previously tried: metoprolol (fatigue) -Current home readings:  127/60 HR 65 (checking daily) -Current dietary habits: pays close attention to salt intake - eats out often but eats a lot of salads; grilled meat and not beef often; does read package labels -Current exercise habits: no changes with activity  -Denies hypotensive/hypertensive symptoms -Educated on BP goals and benefits of medications for prevention of heart attack, stroke and kidney damage; Importance of home blood pressure monitoring; Proper BP monitoring technique; Symptoms of hypotension and importance of maintaining adequate hydration; -Counseled to monitor BP at home at least weekly, document, and provide log at future appointments -Counseled on diet and exercise extensively Recommended to continue current medication  Hyperlipidemia: (LDL goal < 70) -Not ideally controlled -Current treatment: Nexletol 180 mg 1 tablet daily - Appropriate, Query effective, Safe, Accessible -Medications previously tried: statins (muscle pain) -Current dietary patterns: little beef; cooks with olive oil or vegetable oil -Current exercise habits:  not able to right now -Educated on Cholesterol goals;  Importance of limiting foods high in cholesterol; -Counseled on diet and exercise extensively Recommended to continue current medication Assessed patient finances. Patient is approved with [employer]. Cardiology is working on getting her started on Leqvio.  Swelling (Goal: minimize fluid retention) -Controlled -Current treatment  Furosemide 20 mg 1 tablet as needed - Appropriate, Effective, Safe, Accessible -Medications previously tried: none  -Recommended to continue current medication Patient only takes this 4-5 times a week.  Asthma (Goal: control symptoms) -Controlled -Current treatment  Albuterol HFA as needed - Appropriate, Effective, Safe, Accessible Albuterol nebulizer as needed - Appropriate, Effective, Safe, Accessible Symbicort 160-4.5 mcg 2 puffs twice daily - Appropriate, Effective, Safe, Accessible Montelukast 10 mg 1 tablet at bedtime - Appropriate, Effective, Safe, Accessible -Medications previously tried: unknown  -Pulmonary function testing: n/a -Patient reports consistent use of maintenance inhaler -Frequency of rescue inhaler use: hasn't used since the fall -Counseled on Proper inhaler technique; Benefits of consistent maintenance inhaler use -Counseled on diet and exercise extensively Recommended to continue current medication Assessed patient finances. Apply for Symbicort PAP.  Allergic rhinitis (Goal: minimize symptoms) -Controlled -Current treatment  Azelastine nasal spray as needed - Appropriate, Effective, Safe, Accessible Allegra 180 mg 1 tablet daily - Appropriate, Effective, Safe, Accessible Saline solution once a day - Appropriate, Effective, Safe, Accessible Hydroxyzine 10 mg once at bedtime - Appropriate, Effective, Safe, Accessible -Medications previously tried: n/a  -Recommended to continue current medication  Osteoporosis (Goal prevent fractures) -Controlled -Last DEXA Scan:  05/2020   T-Score femoral neck: -2.0  T-Score total hip: n/a  T-Score lumbar spine: 0.8  T-Score forearm radius: n/a  10-year probability of major osteoporotic fracture: 15%  10-year probability of hip fracture: 4.4% -Patient is a candidate for pharmacologic treatment due to T-Score -1.0 to -2.5 and 10-year risk of hip fracture > 3% -Current treatment  Alendronate 70 mg 1 tablet weekly - Tuesdays - Appropriate, Effective, Safe, Accessible Calcium with vitamin D (400 mg with 12.5 mcg) twice daily - Appropriate, Effective, Safe, Accessible -Medications previously tried: none  -Recommend [phone] units of vitamin D daily. Recommend 1200 mg of calcium daily from dietary and supplemental sources. Recommend weight-bearing and muscle strengthening exercises for building and maintaining bone density. -Counseled on diet and exercise extensively Recommended to continue current medication Recommended repeat vitamin D level.  GERD (Goal: minimize symptoms) -Controlled -Current treatment  Omeprazole 40 mg 1 capsule daily - Appropriate, Effective, Safe, Accessible -Medications previously tried: none  -Counseled on risks of taking PPIs long term. Recommended every other day use and can supplement with Tums if needed.  Health Maintenance -Vaccine gaps: none -Current therapy:  Metamucil once a week as needed Miralax as needed Probiotic daily Juice plus 2 daily Omegas by juice plus once daily QBC plex - takes it once a week now -Educated on Cost vs benefit of each product must be carefully weighed by individual consumer -Patient is satisfied with current therapy and denies issues -Recommended to continue current medication  Patient Goals/Self-Care Activities Patient will:  - take medications as prescribed as evidenced by patient report and record review check blood pressure at least weekly, document, and provide at future appointments target a minimum of 150 minutes of moderate intensity  exercise weekly  Follow Up Plan: Telephone follow up appointment with care management team member scheduled for: 6 months      Medication Assistance:  Symbicort obtained through [place].  Enrollment ends 11/03/22  Compliance/Adherence/Medication fill history: Care Gaps: COVID booster, RSV BP- 114/60 08/07/22   Star-Rating Drugs: Losartan 100 mg - Last filled 09/11/22 90 DS    Patient's preferred pharmacy is:  [id number] [address] [place] [address]Phone:: [phone]:: [phone]  Uses pill box? Yes - uses a tackle box for; then weekly for supplements Pt endorses 99% compliance   We discussed: Benefits of medication synchronization, packaging and delivery as well as enhanced pharmacist oversight with [employer]. Patient decided to: Continue current medication management strategy  Care Plan and Follow Up Patient Decision:  Patient agrees to Care Plan and Follow-up.  Plan: Telephone follow up appointment with care management team member scheduled for:  6 months  MJeni Salles PharmD, [phone]

## 2022-10-02 DIAGNOSIS — Z85828 Personal history of other malignant neoplasm of skin: Secondary | ICD-10-CM | POA: Diagnosis not present

## 2022-10-02 DIAGNOSIS — Z08 Encounter for follow-up examination after completed treatment for malignant neoplasm: Secondary | ICD-10-CM | POA: Diagnosis not present

## 2022-10-02 DIAGNOSIS — L821 Other seborrheic keratosis: Secondary | ICD-10-CM | POA: Diagnosis not present

## 2022-10-02 DIAGNOSIS — L218 Other seborrheic dermatitis: Secondary | ICD-10-CM | POA: Diagnosis not present

## 2022-10-02 DIAGNOSIS — Z1283 Encounter for screening for malignant neoplasm of skin: Secondary | ICD-10-CM | POA: Diagnosis not present

## 2022-10-03 DIAGNOSIS — M810 Age-related osteoporosis without current pathological fracture: Secondary | ICD-10-CM

## 2022-10-03 DIAGNOSIS — J4530 Mild persistent asthma, uncomplicated: Secondary | ICD-10-CM

## 2022-10-03 DIAGNOSIS — I10 Essential (primary) hypertension: Secondary | ICD-10-CM

## 2022-10-04 ENCOUNTER — Telehealth: Payer: Self-pay | Admitting: Pharmacist

## 2022-10-04 DIAGNOSIS — E7800 Pure hypercholesterolemia, unspecified: Secondary | ICD-10-CM

## 2022-10-04 NOTE — Telephone Encounter (Signed)
Call to discuss [facility] -insurance coverage.  As patient insurance is only paying 80% and she does not have any supplementary insurance she does not want to start Leqvio this year.  Her insurance is changing for 2024 to the better kind, will drop off the information next week some point.

## 2022-10-07 DIAGNOSIS — J3089 Other allergic rhinitis: Secondary | ICD-10-CM | POA: Diagnosis not present

## 2022-10-07 DIAGNOSIS — J3081 Allergic rhinitis due to animal (cat) (dog) hair and dander: Secondary | ICD-10-CM | POA: Diagnosis not present

## 2022-10-07 DIAGNOSIS — J301 Allergic rhinitis due to pollen: Secondary | ICD-10-CM | POA: Diagnosis not present

## 2022-10-07 MED ORDER — NEXLIZET 180-10 MG PO TABS: 1.0000 | ORAL_TABLET | Freq: Every day | ORAL | 3 refills | Status: DC

## 2022-10-07 NOTE — Telephone Encounter (Signed)
called to see if patient is willing to get enrolled in the grant for [facility] co-pay. LVM

## 2022-10-07 NOTE — Telephone Encounter (Signed)
Patient enrolled in the [facility].  CARD NO. [id number]   CARD STATUS Active   BIN [id number]   PCN [id number]   PC GROUP [id number]

## 2022-10-07 NOTE — Addendum Note (Signed)
Addended by: Anda Latina on: 10/07/2022 05:10 PM   Modules accepted: Orders

## 2022-10-09 ENCOUNTER — Other Ambulatory Visit: Payer: Self-pay | Admitting: *Deleted

## 2022-10-09 MED ORDER — ALENDRONATE SODIUM 70 MG PO TABS
ORAL_TABLET | ORAL | 3 refills | Status: DC
Start: 2022-10-09 — End: 2023-11-11

## 2022-10-14 DIAGNOSIS — M48061 Spinal stenosis, lumbar region without neurogenic claudication: Secondary | ICD-10-CM | POA: Diagnosis not present

## 2022-10-16 DIAGNOSIS — M48061 Spinal stenosis, lumbar region without neurogenic claudication: Secondary | ICD-10-CM | POA: Diagnosis not present

## 2022-10-16 DIAGNOSIS — M5416 Radiculopathy, lumbar region: Secondary | ICD-10-CM | POA: Diagnosis not present

## 2022-10-18 ENCOUNTER — Ambulatory Visit
Admission: RE | Admit: 2022-10-18 | Discharge: 2022-10-18 | Disposition: A | Discharge status: Home / Self Care | Payer: PPO | Source: Ambulatory Visit | Attending: Internal Medicine | Admitting: Internal Medicine

## 2022-10-18 VITALS — BP 140/71 | HR 70 | Temp 98.3°F | Resp 19

## 2022-10-18 DIAGNOSIS — Z1152 Encounter for screening for COVID-19: Secondary | ICD-10-CM | POA: Diagnosis not present

## 2022-10-18 DIAGNOSIS — Z7952 Long term (current) use of systemic steroids: Secondary | ICD-10-CM | POA: Diagnosis not present

## 2022-10-18 DIAGNOSIS — Z79899 Other long term (current) drug therapy: Secondary | ICD-10-CM | POA: Diagnosis not present

## 2022-10-18 DIAGNOSIS — R059 Cough, unspecified: Secondary | ICD-10-CM | POA: Diagnosis not present

## 2022-10-18 DIAGNOSIS — J069 Acute upper respiratory infection, unspecified: Secondary | ICD-10-CM | POA: Diagnosis not present

## 2022-10-18 MED ORDER — PREDNISONE 20 MG PO TABS: 40.0000 mg | ORAL_TABLET | Freq: Every day | ORAL | 0 refills | Status: AC

## 2022-10-18 MED ORDER — BENZONATATE 100 MG PO CAPS: 100.0000 mg | ORAL_CAPSULE | Freq: Three times a day (TID) | ORAL | 0 refills | Status: DC | PRN

## 2022-10-18 NOTE — ED Triage Notes (Signed)
Pt presents to uc with co of congestion and cough for four days. Pt reports she was covid neg at home. Concerned for sinus infection green mucous.

## 2022-10-18 NOTE — ED Provider Notes (Signed)
[name]    CSN: [id number] Arrival date & time: 10/18/22  1753      History   Chief Complaint Chief Complaint  Patient presents with   URI    HPI Brianna Mccarty is a 81 y.o. female.   Patient presents with nasal congestion and cough that has been present for about 2-3 days. Patient does report history of asthma.  She reports that she had feelings of shortness of breath and chest tightness yesterday but used her nebulizer treatment with improvement.  Denies chest pain, sore throat, ear pain, nausea, vomiting, diarrhea, abdominal pain.   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 mild by carotid duplex.   Cervical dysplasia 1971   Coronary artery disease    Diverticulosis of colon    DJD (degenerative joint disease)    Family history of cancer of extrahepatic bile ducts 09/05/2020   Family history of colon cancer 09/05/2020   Family history of pancreatic cancer 09/05/2020   GERD (gastroesophageal reflux disease)    Headache(784.0)    History of kidney stones    Hypercholesterolemia    Hypertension    Interstitial cystitis    sees urologist   Lichen sclerosus    Malignant melanoma ([place]) 2006   sees Dr. Nevada Crane in dermatology   Migraines    Mild aortic stenosis    Mitral valve disease    Question mitral valve prolapse in the past, no prolapse by echo 2009   Murmur 10/20/2015   SEES DR NELSON   OSA (obstructive sleep apnea)    USES DENTAL DEVICE   Osteoporosis    on fosomax > 5 years, stopped 11/2015   Pneumonia    SVT (supraventricular tachycardia) 02/15/2022   Monitor 02/2022: NSR, avg HR 61; 2 runs of NSVT (4 beats); several runs of Supraventricular Tachycardia (longest 3"11'); no AFib   Thyroid cyst    1 x  1.1 thyroid cyst noted on carotid Doppler, January, 2012   Venous insufficiency     Patient Active Problem List   Diagnosis Date Noted   Acute diverticulitis 07/11/2022   SVT (supraventricular tachycardia) 02/15/2022   Palpitations 01/15/2022   Mild aortic stenosis 01/15/2022   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   Genetic testing 09/13/2020   Family history of pancreatic cancer 09/05/2020   Family history of colon cancer 09/05/2020   Family history of cancer of extrahepatic bile ducts 09/05/2020   Fever 07/26/2020   History of COVID-19 07/26/2020   History of melanoma 05/12/2020   Macular degeneration 02/17/2019   OSA (obstructive sleep apnea) 02/12/2018   Prediabetes 02/12/2018   H/O cold sores 11/27/2015   Murmur 10/20/2015   Carotid artery disease ([place])    Thyroid cyst    Venous (peripheral) insufficiency 06/02/2009   Asthma 04/27/2008   Melanoma of skin ([place]) 02/10/2008   Allergic rhinitis 02/10/2008   INTERSTITIAL CYSTITIS 02/10/2008   Pure hypercholesterolemia 02/09/2008   Essential hypertension 02/09/2008   GERD 02/09/2008   Osteoporosis 02/09/2008    Past Surgical History:  Procedure Laterality Date   BREAST BIOPSY Left 11/25/2011  U/S core, benign performed at [facility] [place] CYST ASPIRATION     BREAST SURGERY  2013   Breast Bx-Benign   CARDIAC CATHETERIZATION     CATARACT EXTRACTION, BILATERAL     CHOLECYSTECTOMY N/A 12/22/2019   Procedure: LAPAROSCOPIC CHOLECYSTECTOMY;  Surgeon: Ileana Roup, MD;  Location: [place];  Service: General;  Laterality: N/A;   cystoscopy and basket stone removal right ureter  02/2006   Dr. Robbi Garter SURGERY  12/22/2019   [place]   left total hip replacement  2004   Dr. Gladstone Lighter   melanoma removed from medial rleft knee area  2006   Dr. Nevada Crane   NASAL  SEPTUM SURGERY     TOTAL HIP ARTHROPLASTY Right 08/01/2021   Procedure: [place];  Surgeon: Gaynelle Arabian, MD;  Location: [facility];  Service: Orthopedics;  Laterality: Right;    OB History     Gravida  2   Para  2   Term  2   Preterm      AB      Living  2      SAB      IAB      Ectopic      Multiple      Live Births               Home Medications    Prior to Admission medications   Medication Sig Start Date End Date Taking? Authorizing Provider  benzonatate (TESSALON) 100 MG capsule Take 1 capsule (100 mg total) by mouth every 8 (eight) hours as needed for cough. 10/18/22  Yes Broden Holt, Hildred Alamin E, FNP  predniSONE (DELTASONE) 20 MG tablet Take 2 tablets (40 mg total) by mouth daily for 5 days. 10/18/22 10/23/22 Yes Calissa Swenor, Michele Rockers, FNP  acyclovir (ZOVIRAX) 400 MG tablet Take 1 tablet (400 mg total) by mouth 5 (five) times daily as needed (fever blisters). 02/21/22   [name], NP  albuterol (PROAIR HFA) 108 (90 Base) MCG/ACT inhaler Inhale 2 puffs into the lungs every 6 (six) hours as needed for wheezing. 09/10/18   Noralee Space, MD  albuterol (PROVENTIL) (2.5 MG/3ML) 0.083% nebulizer solution Take 3 mLs (2.5 mg total) by nebulization every 6 (six) hours as needed. 09/10/18 07/09/29  Noralee Space, MD  alendronate (FOSAMAX) 70 MG tablet TAKE 1 TABLET EVERY 7 (SEVEN) DAYS. TAKE WITH A FULL GLASS OF WATER ON AN EMPTY STOMACH. 10/09/22   Farrel Conners, MD  amLODipine (NORVASC) 10 MG tablet Take 1 tablet (10 mg total) by mouth daily. 03/20/22   Caren Macadam, MD  azelastine (ASTELIN) 0.1 % nasal spray Instill 2 sprays into each nostril two times a day as needed for allergies 02/03/21   Scot Jun, FNP  Bempedoic Acid-Ezetimibe (NEXLIZET) 180-10 MG TABS Take 1 tablet by mouth daily. 10/07/22   Freada Bergeron, MD  betamethasone dipropionate 0.05 % cream Apply 1 application topically 2 (two) times daily as needed (scaly  skin). 03/29/21   [provider]  budesonide-formoterol (SYMBICORT) 160-4.5 MCG/ACT inhaler Inhale 2 puffs into the lungs 2 (two) times daily. 10/05/21   Caren Macadam, MD  Cholecalciferol 125 MCG (5000 UT) TABS Take 1 tablet by mouth daily.    [provider]  desmopressin (DDAVP) 0.2 MG tablet Take 0.2 mg by mouth at bedtime.    [provider]  EPINEPHrine 0.3 mg/0.3 mL IJ SOAJ  injection Inject 0.3 mg into the muscle as needed for anaphylaxis. 12/21/18   [provider]  fexofenadine (ALLEGRA) 180 MG tablet Take 180 mg by mouth daily.    [provider]  furosemide (LASIX) 20 MG tablet Take 1 tablet (20 mg total) by mouth daily as needed for edema. 12/13/21   Freada Bergeron, MD  hydrALAZINE (APRESOLINE) 50 MG tablet TAKE 1 TAB 3 TIMES A DAY AND TAKE EXTRA TAB IF SBP>160 09/13/22   Freada Bergeron, MD  hydrOXYzine (ATARAX/VISTARIL) 10 MG tablet Take 10 mg by mouth at bedtime.     [provider]  losartan (COZAAR) 100 MG tablet Take 1 tablet (100 mg total) by mouth daily. 09/11/22   Farrel Conners, MD  METAMUCIL FIBER PO Take 1 capsule by mouth once a week.    [provider]  metoprolol tartrate (LOPRESSOR) 25 MG tablet Take 12.5 mg by mouth daily. Take half a tablet daily    [provider]  montelukast (SINGULAIR) 10 MG tablet Take 10 mg by mouth at bedtime.    [provider]  omeprazole (PRILOSEC) 40 MG capsule Take 1 capsule (40 mg total) by mouth daily. 09/09/22   Farrel Conners, MD  pentosan polysulfate (ELMIRON) 100 MG capsule Take 100 mg by mouth daily.    [provider]  prazosin (MINIPRESS) 5 MG capsule *NEED OFFICE VISIT* TAKE 1 CAPSULE BY MOUTH TWICE A DAY 08/05/22   Farrel Conners, MD  Probiotic Product (PROBIOTIC PO) Take 1 capsule by mouth daily.     [provider]    Family History Family History  Problem Relation Age of Onset   Pancreatic cancer Father  83   Diabetes Mother    Heart disease Mother    Cancer Brother 49       Bile duct   Diabetes Brother    Hypertension Brother    Diabetes Brother    Heart disease Brother    Hypertension Brother    Hypertension Sister    Hypertension Sister    Colon cancer Paternal Uncle        dx 14s   Lung cancer Paternal Uncle        dx 55s    Social History Social History   Tobacco Use   Smoking status: Never   Smokeless tobacco: Never  Vaping Use   Vaping Use: Never used  Substance Use Topics   Alcohol use: Yes    Alcohol/week: 1.0 standard drink of alcohol    Types: 1 Standard drinks or equivalent per week    Comment:  OCC WINE   Drug use: No     Allergies   Celebrex [celecoxib], Cephalexin, Hydrochlorothiazide, Penicillins, Phenobarbital, Statins, Passion fruit flavor, and Tape   Review of Systems Review of Systems Per HPI  Physical Exam Triage Vital Signs ED Triage Vitals  Enc Vitals Group     BP 10/18/22 1820 (!) 140/71     Pulse Rate 10/18/22 1819 70     Resp 10/18/22 1819 19     Temp 10/18/22 1819 98.3 F (36.8 C)     Temp Source 10/18/22 1819 Oral     SpO2 10/18/22 1819 93 %     Weight --      Height --      Head Circumference --      Peak Flow --      Pain Score 10/18/22 1821 0     Pain Loc --  Pain Edu? --      Excl. in [place]? --    No data found.  Updated Vital Signs BP (!) 140/71   Pulse 70   Temp 98.3 F (36.8 C) (Oral)   Resp 19   SpO2 98%   Visual Acuity Right Eye Distance:   Left Eye Distance:   Bilateral Distance:    Right Eye Near:   Left Eye Near:    Bilateral Near:     Physical Exam Constitutional:      General: She is not in acute distress.    Appearance: Normal appearance. She is not toxic-appearing or diaphoretic.  HENT:     Head: Normocephalic and atraumatic.     Right Ear: Tympanic membrane and ear canal normal.     Left Ear: Tympanic membrane and ear canal normal.     Nose: Congestion present.     Mouth/Throat:      Mouth: Mucous membranes are moist.     Pharynx: No posterior oropharyngeal erythema.  Eyes:     Extraocular Movements: Extraocular movements intact.     Conjunctiva/sclera: Conjunctivae normal.     Pupils: Pupils are equal, round, and reactive to light.  Cardiovascular:     Rate and Rhythm: Normal rate and regular rhythm.     Pulses: Normal pulses.     Heart sounds: Normal heart sounds.  Pulmonary:     Effort: Pulmonary effort is normal. No respiratory distress.     Breath sounds: Normal breath sounds. No stridor. No wheezing, rhonchi or rales.  Abdominal:     General: Abdomen is flat. Bowel sounds are normal.     Palpations: Abdomen is soft.  Musculoskeletal:        General: Normal range of motion.     Cervical back: Normal range of motion.  Skin:    General: Skin is warm and dry.  Neurological:     General: No focal deficit present.     Mental Status: She is alert and oriented to person, place, and time. Mental status is at baseline.  Psychiatric:        Mood and Affect: Mood normal.        Behavior: Behavior normal.      UC Treatments / Results  Labs (all labs ordered are listed, but only abnormal results are displayed) Labs Reviewed  RESP PANEL BY RT-PCR (FLU A&B, COVID) ARPGX2    EKG   Radiology No results found.  Procedures Procedures (including critical care time)  Medications Ordered in UC Medications - No data to display  Initial Impression / Assessment and Plan / UC Course  I have reviewed the triage vital signs and the nursing notes.  Pertinent labs & imaging results that were available during my care of the patient were reviewed by me and considered in my medical decision making (see chart for details).     Patient presents with symptoms likely from a viral upper respiratory infection. Differential includes bacterial pneumonia, sinusitis, allergic rhinitis, COVID-19, flu, RSV. Do not suspect underlying cardiopulmonary process. Symptoms seem  unlikely related to ACS, CHF or COPD exacerbations, pneumonia, pneumothorax. Patient is nontoxic appearing and not in need of emergent medical intervention.  COVID and flu test pending.  Last GFR was over 60 so patient may qualify for Paxlovid or molnupiravir if COVID test is positive.  Although, molnupiravir will be best option given that she is taking prednisone and Paxlovid could increase concentration of prednisone.  Recommended symptom control with medications and supportive care.  I do think patient would benefit from prednisone given suspicion of asthma exacerbation noted on exam.  No obvious contraindications to prednisone on exam.  Patient has taken before and tolerated well.  Patient does have osteoporosis and it appears that she has ascending aortic dilation but low-dose and short course of prednisone should be safe as benefits outweigh risks.  Patient received steroid injection in the spine a few days but another course of prednisone should be safe given spinal injection was not systemic.  Oxygen was 97% and sustaining.  Do not think chest imaging is necessary given no adventitious lung sounds on exam, no tachypnea, no signs of respiratory compromise.  Return if symptoms fail to improve.  Patient has oxygen monitor at home and encouraged her to monitor this.  Patient given strict return and ER precautions.  Patient states understanding and is agreeable.  Discharged with PCP followup.  Final Clinical Impressions(s) / UC Diagnoses   Final diagnoses:  Viral upper respiratory tract infection with cough     Discharge Instructions      You have a viral upper respiratory infection.  I have prescribed cough medication and prednisone.  COVID and flu test are pending.  Will call if they are positive. please follow-up if symptoms persist or worsen.    ED Prescriptions     Medication Sig Dispense Auth. Provider   benzonatate (TESSALON) 100 MG capsule Take 1 capsule (100 mg total) by mouth  every 8 (eight) hours as needed for cough. 21 capsule Barrett, [place] E, [place]   predniSONE (DELTASONE) 20 MG tablet Take 2 tablets (40 mg total) by mouth daily for 5 days. 10 tablet Teodora Medici, [place]      PDMP not reviewed this encounter.   Teodora Medici, [place] 10/18/22 1924

## 2022-10-18 NOTE — Discharge Instructions (Signed)
You have a viral upper respiratory infection.  I have prescribed cough medication and prednisone.  COVID and flu test are pending.  Will call if they are positive. please follow-up if symptoms persist or worsen.

## 2022-10-19 LAB — RESP PANEL BY RT-PCR (FLU A&B, COVID) ARPGX2
Influenza A by PCR: NEGATIVE
Influenza B by PCR: NEGATIVE
SARS Coronavirus 2 by RT PCR: NEGATIVE

## 2022-11-05 DIAGNOSIS — J301 Allergic rhinitis due to pollen: Secondary | ICD-10-CM | POA: Diagnosis not present

## 2022-11-05 DIAGNOSIS — J3089 Other allergic rhinitis: Secondary | ICD-10-CM | POA: Diagnosis not present

## 2022-11-05 DIAGNOSIS — J3081 Allergic rhinitis due to animal (cat) (dog) hair and dander: Secondary | ICD-10-CM | POA: Diagnosis not present

## 2022-11-11 ENCOUNTER — Encounter: Payer: Self-pay | Admitting: Nurse Practitioner

## 2022-11-11 ENCOUNTER — Other Ambulatory Visit (INDEPENDENT_AMBULATORY_CARE_PROVIDER_SITE_OTHER): Payer: PPO

## 2022-11-11 ENCOUNTER — Ambulatory Visit (INDEPENDENT_AMBULATORY_CARE_PROVIDER_SITE_OTHER): Payer: PPO | Admitting: Nurse Practitioner

## 2022-11-11 VITALS — BP 160/80 | HR 57 | Ht 62.0 in | Wt 164.0 lb

## 2022-11-11 DIAGNOSIS — D649 Anemia, unspecified: Secondary | ICD-10-CM | POA: Diagnosis not present

## 2022-11-11 DIAGNOSIS — R194 Change in bowel habit: Secondary | ICD-10-CM

## 2022-11-11 LAB — CBC
HCT: 37.1 % (ref 36.0–46.0)
Hemoglobin: 12.3 g/dL (ref 12.0–15.0)
MCHC: 33.2 g/dL (ref 30.0–36.0)
MCV: 88.1 fl (ref 78.0–100.0)
Platelets: 196 10*3/uL (ref 150.0–400.0)
RBC: 4.21 Mil/uL (ref 3.87–5.11)
RDW: 15 % (ref 11.5–15.5)
WBC: 5.9 10*3/uL (ref 4.0–10.5)

## 2022-11-11 LAB — IBC + FERRITIN
Ferritin: 13.8 ng/mL (ref 10.0–291.0)
Iron: 63 ug/dL (ref 42–145)
Saturation Ratios: 13.6 % — ABNORMAL LOW (ref 20.0–50.0)
TIBC: 462 ug/dL — ABNORMAL HIGH (ref 250.0–450.0)
Transferrin: 330 mg/dL (ref 212.0–360.0)

## 2022-11-11 LAB — B12 AND FOLATE PANEL
Folate: >23.8 ng/mL (ref >5.9)
Vitamin B-12: >1500 pg/mL — ABNORMAL HIGH (ref 211–911)

## 2022-11-11 NOTE — Progress Notes (Signed)
11/11/2022 PARTHENA FERGESON [id number] 05/17/1941   Chief Complaint: Frequent bowel movements  History of Present Illness: Brianna Mccarty. Bonine is an 82 year old female with a past medical history of asthma, OSA uses dental device, hypertension, hypercholesterolemia, mild aortic stenosis, SVT, ascending aortic dilatation, malignant melanoma 2006, interstitial cystitis, pancreatic cysts per MRI, GERD and diverticulosis.  Past right and left hip replacement surgery. She has known by Dr. Fuller Plan.     Refer to office visit  08/07/2022 for comprehensive history review.  A GI pathogen panel was negative.  CBC showed a WBC count of 4.9.  Hemoglobin 11.4.  HCT 33.3 and MCV 88.9. Glucose 132, Alk phosphatase 31.  Bili 0.3.  AST 12.  ALT 7.  She was instructed to return to our lab in 6 weeks to have an iron panel and B12 level drawn which were not done.  She was prescribed Benefiber and as she was instructed to keep well-hydrated and to maintain a bland diet.  She presents today for further follow-up.  She reported having less diarrhea, gas and bloat.  She is passing 5-6 formed or mushy bowel movements daily with intermittent explosive loose stools.  No bloody bowel movements.  No further nausea.  No significant abdominal pain but she stated her abdomen doesn't feel right, doesn't feel normal.  She stopped taking the Benefiber because she constipation after she took it.  She uses fat-free half-and-half in her coffee each morning.  She occasionally eats cheese and cottage cheese.  She questions if her high stress level contributed to her GI symptoms.  She is taking a probiotic daily.  No GERD symptoms on Omeprazole 40 mg p.o. daily.  Last prescribed antibiotics (Cipro/Flagyl) x 7 days by her PCP for possible diverticulitis.  GI PROCEDURES:   Colonoscopy 02/07/2015: 1. Mild diverticulosis was noted in the sigmoid colon 2. The examination was otherwise normal No further screening colonoscopies recommended due to  age  Current Outpatient Medications on File Prior to Visit  Medication Sig Dispense Refill   acyclovir (ZOVIRAX) 400 MG tablet Take 1 tablet (400 mg total) by mouth 5 (five) times daily as needed (fever blisters). 30 tablet 2   albuterol (PROAIR HFA) 108 (90 Base) MCG/ACT inhaler Inhale 2 puffs into the lungs every 6 (six) hours as needed for wheezing. 3 Inhaler 3   albuterol (PROVENTIL) (2.5 MG/3ML) 0.083% nebulizer solution Take 3 mLs (2.5 mg total) by nebulization every 6 (six) hours as needed. 360 mL 3   alendronate (FOSAMAX) 70 MG tablet TAKE 1 TABLET EVERY 7 (SEVEN) DAYS. TAKE WITH A FULL GLASS OF WATER ON AN EMPTY STOMACH. 12 tablet 3   amLODipine (NORVASC) 10 MG tablet Take 1 tablet (10 mg total) by mouth daily. 90 tablet 1   azelastine (ASTELIN) 0.1 % nasal spray Instill 2 sprays into each nostril two times a day as needed for allergies 30 mL 0   Bempedoic Acid-Ezetimibe (NEXLIZET) 180-10 MG TABS Take 1 tablet by mouth daily. 90 tablet 3   betamethasone dipropionate 0.05 % cream Apply 1 application topically 2 (two) times daily as needed (scaly skin).     budesonide-formoterol (SYMBICORT) 160-4.5 MCG/ACT inhaler Inhale 2 puffs into the lungs 2 (two) times daily. 3 each 3   Cholecalciferol 125 MCG (5000 UT) TABS Take 1 tablet by mouth daily.     desmopressin (DDAVP) 0.2 MG tablet Take 0.2 mg by mouth at bedtime.     EPINEPHrine 0.3 mg/0.3 mL IJ SOAJ injection Inject  0.3 mg into the muscle as needed for anaphylaxis.     fexofenadine (ALLEGRA) 180 MG tablet Take 180 mg by mouth daily.     furosemide (LASIX) 20 MG tablet Take 1 tablet (20 mg total) by mouth daily as needed for edema. 30 tablet 3   hydrALAZINE (APRESOLINE) 50 MG tablet TAKE 1 TAB 3 TIMES A DAY AND TAKE EXTRA TAB IF SBP>160 315 tablet 3   hydrOXYzine (ATARAX/VISTARIL) 10 MG tablet Take 10 mg by mouth at bedtime.      losartan (COZAAR) 100 MG tablet Take 1 tablet (100 mg total) by mouth daily. 90 tablet 1   METAMUCIL FIBER PO  Take 1 capsule by mouth once a week.     metoprolol tartrate (LOPRESSOR) 25 MG tablet Take 12.5 mg by mouth daily. Take half a tablet daily     montelukast (SINGULAIR) 10 MG tablet Take 10 mg by mouth at bedtime.     omeprazole (PRILOSEC) 40 MG capsule Take 1 capsule (40 mg total) by mouth daily. 90 capsule 0   pentosan polysulfate (ELMIRON) 100 MG capsule Take 100 mg by mouth daily.     prazosin (MINIPRESS) 5 MG capsule *NEED OFFICE VISIT* TAKE 1 CAPSULE BY MOUTH TWICE A DAY 180 capsule 1   Probiotic Product (PROBIOTIC PO) Take 1 capsule by mouth daily.      No current facility-administered medications on file prior to visit.   Allergies  Allergen Reactions   Celebrex [Celecoxib] Diarrhea   Cephalexin     Reaction was a high fever   Hydrochlorothiazide     hyponatremia   Penicillins Hives and Swelling    Swelling of arms & face Has patient had a PCN reaction causing immediate rash, facial/tongue/throat swelling, SOB or lightheadedness with hypotension: Yes Has patient had a PCN reaction causing severe rash involving mucus membranes or skin necrosis: No Has patient had a PCN reaction that required hospitalization: No Has patient had a PCN reaction occurring within the last 10 years: No If all of the above answers are "NO", then may proceed with Cephalosporin use.    Phenobarbital Hives   Statins     Muscle pain   Passion Fruit Flavor Diarrhea and Rash   Tape Rash    MEDICAL TAPE   Current Medications, Allergies, Past Medical History, Past Surgical History, Family History and Social History were reviewed in [employer] record.  Review of Systems:   Constitutional: Negative for fever, sweats, chills or weight loss.  Respiratory: Negative for shortness of breath.   Cardiovascular: Negative for chest pain, palpitations and leg swelling.  Gastrointestinal: See HPI.  Musculoskeletal: Negative for back pain or muscle aches.  Neurological: Negative for  dizziness, headaches or paresthesias.   Physical Exam: BP (!) 160/80   Pulse (!) 57   Ht '5\' 2"'$  (1.575 m)   Wt 164 lb (74.4 kg)   BMI 30.00 kg/m  General: 82 year old female in no acute distress. Head: Normocephalic and atraumatic. Eyes: No scleral icterus. Conjunctiva pink . Ears: Normal auditory acuity. Mouth: Dentition intact. No ulcers or lesions.  Lungs: Clear throughout to auscultation. Heart: Regular rate and rhythm. Systolic murmur.  Abdomen: Soft, nontender. Mild gaseous distension. No masses or hepatomegaly. Normal bowel sounds x 4 quadrants.  Rectal: Deferred.  Musculoskeletal: Symmetrical with no gross deformities. Extremities: No edema. Neurological: Alert oriented x 4. No focal deficits.  Psychological: Alert and cooperative. 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 Abdominal pain/bloat abated. She is experiencing less diarrhea but continues to have an altered bowel pattern consisting of 5 to 6 solid/soft or explosive bowel movements daily.  GI pathogen panel was negative (did not include C. Diff PCR). Colonoscopy 02/2015 showed mild diverticulosis to the sigmoid colon, no polyps. -Keep well-hydrated, bland diet as tolerated -Check C. difficile if patient persistently passes loose to watery diarrhea -Consider SIBO testing -Consider a diagnostic colonoscopy if no improvement -Lactaid 2 tabs with each dairy product -Imodium one half tab p.o. daily, may increase to 1 tab p.o. daily.  Hold if no BM in 24 hours.   2) History of pancreatic cysts. Her most recent abdominal MRI 04/06/2022 showed multiple benign-appearing cysts scattered throughout the pancreas, 1 cyst arising off the body the pancreas has increased in size compared with previous exam measuring 1.5 cm on today's study versus 1.1 cm previously.  The remaining cysts were unchanged.  Father died from pancreatic cancer and brother died from  biliary cancer.  She wishes to continue surveillance MRI imaging. Repeat abdominal Mri in one year per Dr. Barry Dienes.   3) GERD, stable    4) Mild normocytic anemia -CBC, IBC + ferritin, B12 and folate level -Diagnostic EGD and colonoscopy if iron deficient

## 2022-11-11 NOTE — Patient Instructions (Signed)
Your provider has requested that you go to the basement level for lab work before leaving today. Press "B" on the elevator. The lab is located at the first door on the left as you exit the elevator.   Stop Benefiber.   Take Lactaid over the counter 2 tablets with each daily product.  Take half of Imodium tablet once daily as needed and stop if no bowel movement in 24 hours.  Due to recent changes in healthcare laws, you may see the results of your imaging and laboratory studies on MyChart before your provider has had a chance to review them.  We understand that in some cases there may be results that are confusing or concerning to you. Not all laboratory results come back in the same time frame and the provider may be waiting for multiple results in order to interpret others.  Please give [place] 48 hours in order for your provider to thoroughly review all the results before contacting the office for clarification of your results.   Thank you for trusting me with your gastrointestinal care!   Carl Best, CRNP

## 2022-11-13 ENCOUNTER — Other Ambulatory Visit: Payer: Self-pay

## 2022-11-13 DIAGNOSIS — A09 Infectious gastroenteritis and colitis, unspecified: Secondary | ICD-10-CM

## 2022-11-13 DIAGNOSIS — D649 Anemia, unspecified: Secondary | ICD-10-CM

## 2022-11-13 MED ORDER — NA SULFATE-K SULFATE-MG SULF 17.5-3.13-1.6 GM/177ML PO SOLN: ORAL | 0 refills | Status: DC

## 2022-11-14 ENCOUNTER — Ambulatory Visit: Payer: PPO | Admitting: Family Medicine

## 2022-11-14 DIAGNOSIS — H00025 Hordeolum internum left lower eyelid: Secondary | ICD-10-CM | POA: Diagnosis not present

## 2022-11-14 DIAGNOSIS — H00035 Abscess of left lower eyelid: Secondary | ICD-10-CM | POA: Diagnosis not present

## 2022-11-20 ENCOUNTER — Other Ambulatory Visit (INDEPENDENT_AMBULATORY_CARE_PROVIDER_SITE_OTHER): Payer: PPO

## 2022-11-20 ENCOUNTER — Telehealth: Payer: Self-pay | Admitting: Family Medicine

## 2022-11-20 DIAGNOSIS — D649 Anemia, unspecified: Secondary | ICD-10-CM | POA: Diagnosis not present

## 2022-11-20 DIAGNOSIS — J301 Allergic rhinitis due to pollen: Secondary | ICD-10-CM | POA: Diagnosis not present

## 2022-11-20 DIAGNOSIS — A09 Infectious gastroenteritis and colitis, unspecified: Secondary | ICD-10-CM

## 2022-11-20 DIAGNOSIS — J3089 Other allergic rhinitis: Secondary | ICD-10-CM | POA: Diagnosis not present

## 2022-11-20 DIAGNOSIS — J3081 Allergic rhinitis due to animal (cat) (dog) hair and dander: Secondary | ICD-10-CM | POA: Diagnosis not present

## 2022-11-20 LAB — C-REACTIVE PROTEIN: CRP: <1 mg/dL (ref 0.5–20.0)

## 2022-11-20 NOTE — Telephone Encounter (Signed)
furosemide (LASIX) 20 MG tablet  [id number] [place] [address] Phone: [phone] Fax: [phone]   Patient has been out for almost a week.

## 2022-11-21 LAB — TISSUE TRANSGLUTAMINASE ABS,IGG,IGA
(tTG) Ab, IgA: <1 U/mL
(tTG) Ab, IgG: <1 U/mL

## 2022-11-21 LAB — IGA: Immunoglobulin A: 188 mg/dL (ref 70–320)

## 2022-11-21 MED ORDER — FUROSEMIDE 20 MG PO TABS: 20.0000 mg | ORAL_TABLET | Freq: Every day | ORAL | 3 refills | Status: DC | PRN

## 2022-11-21 NOTE — Addendum Note (Signed)
Addended by: Nilda Riggs on: 11/21/2022 10:34 AM   Modules accepted: Orders

## 2022-11-21 NOTE — Telephone Encounter (Signed)
Ok to refill

## 2022-11-21 NOTE — Telephone Encounter (Signed)
Rx sent 

## 2022-11-26 ENCOUNTER — Other Ambulatory Visit: Payer: Self-pay | Admitting: *Deleted

## 2022-11-26 ENCOUNTER — Other Ambulatory Visit (HOSPITAL_BASED_OUTPATIENT_CLINIC_OR_DEPARTMENT_OTHER): Payer: PPO

## 2022-11-26 MED ORDER — AMLODIPINE BESYLATE 10 MG PO TABS: 10.0000 mg | ORAL_TABLET | Freq: Every day | ORAL | 1 refills | Status: DC

## 2022-11-28 DIAGNOSIS — J3089 Other allergic rhinitis: Secondary | ICD-10-CM | POA: Diagnosis not present

## 2022-11-28 DIAGNOSIS — J301 Allergic rhinitis due to pollen: Secondary | ICD-10-CM | POA: Diagnosis not present

## 2022-11-28 DIAGNOSIS — J3081 Allergic rhinitis due to animal (cat) (dog) hair and dander: Secondary | ICD-10-CM | POA: Diagnosis not present

## 2022-12-03 DIAGNOSIS — H00024 Hordeolum internum left upper eyelid: Secondary | ICD-10-CM | POA: Diagnosis not present

## 2022-12-05 ENCOUNTER — Other Ambulatory Visit: Payer: Self-pay | Admitting: Family Medicine

## 2022-12-10 ENCOUNTER — Ambulatory Visit (INDEPENDENT_AMBULATORY_CARE_PROVIDER_SITE_OTHER)
Admission: RE | Admit: 2022-12-10 | Discharge: 2022-12-10 | Disposition: A | Discharge status: Home / Self Care | Payer: HMO | Source: Ambulatory Visit | Attending: Family Medicine | Admitting: Family Medicine

## 2022-12-10 ENCOUNTER — Encounter: Payer: Self-pay | Admitting: Gastroenterology

## 2022-12-10 DIAGNOSIS — J3081 Allergic rhinitis due to animal (cat) (dog) hair and dander: Secondary | ICD-10-CM | POA: Diagnosis not present

## 2022-12-10 DIAGNOSIS — J301 Allergic rhinitis due to pollen: Secondary | ICD-10-CM | POA: Diagnosis not present

## 2022-12-10 DIAGNOSIS — M810 Age-related osteoporosis without current pathological fracture: Secondary | ICD-10-CM | POA: Diagnosis not present

## 2022-12-10 DIAGNOSIS — J3089 Other allergic rhinitis: Secondary | ICD-10-CM | POA: Diagnosis not present

## 2022-12-12 ENCOUNTER — Telehealth: Payer: Self-pay

## 2022-12-12 NOTE — Progress Notes (Signed)
Patient ID: Brianna Mccarty, female   DOB: 1941-09-16, 82 y.o.   MRN: [id number]  Care Management & Coordination Services Pharmacy Team  Reason for Encounter: Hypertension  Contacted patient to discuss hypertension disease state. Spoke with patient on 12/17/2022     Current antihypertensive regimen:  Amlodipine 10 mg daily  Patient verbally confirms she is taking the above medications as directed. Yes  How often are you checking your Blood Pressure? 1-2x per week  she checks her blood pressure in the afternoon after taking her medication.  Current home BP readings: 120/70 at home Patient reports she has been really busy with helping get her sister placed into an [place] and some upcoming procedures she has to have done herself on tomorrow. She denies any hypotensive symptoms at this time and is in agreement with Pharmacist outreach in May.  Any readings above 180/100? No If yes any symptoms of hypertensive emergency? patient denies any symptoms of high blood pressure  What recent interventions/DTPs have been made by any provider to improve Blood Pressure control since last CPP Visit: Patient reports no changes  Any recent hospitalizations or ED visits since last visit with CPP? No  Adherence Review: Is the patient currently on ACE/ARB medication? Yes Does the patient have >5 day gap between last estimated fill dates? No  Star Rating Drugs: Losartan 100 mg - Last filled 12/07/22 90 DS   Chart Updates: Recent office visits:  None  Recent consult visits:  11/28/22 [name] - Patient presented for Allergic rhinitis due to animal hair and other concerns. No medication changes.   11/20/22 [name] - Patient presented for Allergic rhinitis due to animal hair and other concerns. No medication changes.   11/11/22 Noralyn Pick, NP Gertie Fey) - Patient presented for Anemia unspecified type and other concerns. Stopped Benzonatate.  Hospital  visits:  Medication Reconciliation was completed by comparing discharge summary, patient's EMR and Pharmacy list, and upon discussion with patient.  Patient presented for Viral Upper respiratory ract infection with cough on 10/18/22 Patient was present for 39 min.    New?Medications Started at [facility] Discharge:?? -started   predniSONE 40 m  Benzonatate 100 mg  Medication Changes at Hospital Discharge: -Changed  none  Medications Discontinued at Hospital Discharge: -Stopped  none  Medications that remain the same after Hospital Discharge:??  -All other medications will remain the same.    Medications: Outpatient Encounter Medications as of 12/12/2022  Medication Sig   acyclovir (ZOVIRAX) 400 MG tablet Take 1 tablet (400 mg total) by mouth 5 (five) times daily as needed (fever blisters).   albuterol (PROAIR HFA) 108 (90 Base) MCG/ACT inhaler Inhale 2 puffs into the lungs every 6 (six) hours as needed for wheezing.   albuterol (PROVENTIL) (2.5 MG/3ML) 0.083% nebulizer solution Take 3 mLs (2.5 mg total) by nebulization every 6 (six) hours as needed.   alendronate (FOSAMAX) 70 MG tablet TAKE 1 TABLET EVERY 7 (SEVEN) DAYS. TAKE WITH A FULL GLASS OF WATER ON AN EMPTY STOMACH.   amLODipine (NORVASC) 10 MG tablet Take 1 tablet (10 mg total) by mouth daily.   azelastine (ASTELIN) 0.1 % nasal spray Instill 2 sprays into each nostril two times a day as needed for allergies   Bempedoic Acid-Ezetimibe (NEXLIZET) 180-10 MG TABS Take 1 tablet by mouth daily.   betamethasone dipropionate 0.05 % cream Apply 1 application topically 2 (two) times daily as needed (scaly skin).   budesonide-formoterol (SYMBICORT) 160-4.5 MCG/ACT inhaler Inhale 2  puffs into the lungs 2 (two) times daily.   Cholecalciferol 125 MCG (5000 UT) TABS Take 1 tablet by mouth daily.   desmopressin (DDAVP) 0.2 MG tablet Take 0.2 mg by mouth at bedtime.   EPINEPHrine 0.3 mg/0.3 mL IJ SOAJ injection Inject 0.3 mg into the muscle as  needed for anaphylaxis.   fexofenadine (ALLEGRA) 180 MG tablet Take 180 mg by mouth daily.   furosemide (LASIX) 20 MG tablet Take 1 tablet (20 mg total) by mouth daily as needed for edema.   hydrALAZINE (APRESOLINE) 50 MG tablet TAKE 1 TAB 3 TIMES A DAY AND TAKE EXTRA TAB IF SBP>160   hydrOXYzine (ATARAX/VISTARIL) 10 MG tablet Take 10 mg by mouth at bedtime.    losartan (COZAAR) 100 MG tablet Take 1 tablet (100 mg total) by mouth daily.   METAMUCIL FIBER PO Take 1 capsule by mouth once a week.   metoprolol tartrate (LOPRESSOR) 25 MG tablet Take 12.5 mg by mouth daily. Take half a tablet daily   montelukast (SINGULAIR) 10 MG tablet Take 10 mg by mouth at bedtime.   Na Sulfate-K Sulfate-Mg Sulf 17.5-3.13-1.6 GM/177ML SOLN Take as instructed on your Colonoscopy Prep Instructions   omeprazole (PRILOSEC) 40 MG capsule TAKE 1 CAPSULE (40 MG TOTAL) BY MOUTH DAILY.   pentosan polysulfate (ELMIRON) 100 MG capsule Take 100 mg by mouth daily.   prazosin (MINIPRESS) 5 MG capsule *NEED OFFICE VISIT* TAKE 1 CAPSULE BY MOUTH TWICE A DAY   Probiotic Product (PROBIOTIC PO) Take 1 capsule by mouth daily.    No facility-administered encounter medications on file as of 12/12/2022.    Recent Office Vitals: BP Readings from Last 3 Encounters:  11/11/22 (!) 160/80  10/18/22 (!) 140/71  08/07/22 114/60   Pulse Readings from Last 3 Encounters:  11/11/22 (!) 57  10/18/22 70  08/07/22 65    Wt Readings from Last 3 Encounters:  11/11/22 164 lb (74.4 kg)  08/07/22 165 lb (74.8 kg)  08/07/22 165 lb 6.4 oz (75 kg)     Kidney Function Lab Results  Component Value Date/Time   CREATININE 0.80 08/07/2022 03:19 PM   CREATININE 0.84 04/24/2022 07:24 AM   CREATININE 0.88 05/12/2020 02:48 PM   CREATININE 1.14 (H) 12/21/2015 12:42 PM   GFR 68.47 08/07/2022 03:19 PM   GFRNONAA >60 10/21/2021 08:51 AM   GFRAA 84 07/24/2020 03:56 PM       Latest Ref Rng & Units 08/07/2022    3:19 PM 04/24/2022    7:24 AM 04/04/2022     4:17 PM  BMP  Glucose 70 - 99 mg/dL 132  94  89   BUN 6 - 23 mg/dL 15  16  16   $ Creatinine 0.40 - 1.20 mg/dL 0.80  0.84  0.74   BUN/Creat Ratio 12 - 28  19  22   $ Sodium 135 - 145 mEq/L 139  140  137   Potassium 3.5 - 5.1 mEq/L 3.8  4.5  4.0   Chloride 96 - 112 mEq/L 105  105  101   CO2 19 - 32 mEq/L 29  25  24   $ Calcium 8.4 - 10.5 mg/dL 9.2  9.2  8.9        Ned Clines CMA Clinical Pharmacist Assistant [phone]

## 2022-12-15 ENCOUNTER — Encounter: Payer: Self-pay | Admitting: Certified Registered Nurse Anesthetist

## 2022-12-16 DIAGNOSIS — J3081 Allergic rhinitis due to animal (cat) (dog) hair and dander: Secondary | ICD-10-CM | POA: Diagnosis not present

## 2022-12-16 DIAGNOSIS — J3089 Other allergic rhinitis: Secondary | ICD-10-CM | POA: Diagnosis not present

## 2022-12-16 DIAGNOSIS — J301 Allergic rhinitis due to pollen: Secondary | ICD-10-CM | POA: Diagnosis not present

## 2022-12-18 ENCOUNTER — Ambulatory Visit (AMBULATORY_SURGERY_CENTER): Payer: HMO | Admitting: Gastroenterology

## 2022-12-18 ENCOUNTER — Encounter: Payer: Self-pay | Admitting: Gastroenterology

## 2022-12-18 VITALS — BP 154/76 | HR 61 | Temp 98.0°F | Resp 13 | Ht 62.0 in | Wt 164.0 lb

## 2022-12-18 DIAGNOSIS — I2510 Atherosclerotic heart disease of native coronary artery without angina pectoris: Secondary | ICD-10-CM | POA: Diagnosis not present

## 2022-12-18 DIAGNOSIS — K3189 Other diseases of stomach and duodenum: Secondary | ICD-10-CM

## 2022-12-18 DIAGNOSIS — R194 Change in bowel habit: Secondary | ICD-10-CM | POA: Diagnosis not present

## 2022-12-18 DIAGNOSIS — K449 Diaphragmatic hernia without obstruction or gangrene: Secondary | ICD-10-CM

## 2022-12-18 DIAGNOSIS — K319 Disease of stomach and duodenum, unspecified: Secondary | ICD-10-CM | POA: Diagnosis not present

## 2022-12-18 DIAGNOSIS — D122 Benign neoplasm of ascending colon: Secondary | ICD-10-CM | POA: Diagnosis not present

## 2022-12-18 DIAGNOSIS — K635 Polyp of colon: Secondary | ICD-10-CM | POA: Diagnosis not present

## 2022-12-18 DIAGNOSIS — K5520 Angiodysplasia of colon without hemorrhage: Secondary | ICD-10-CM | POA: Diagnosis not present

## 2022-12-18 DIAGNOSIS — R197 Diarrhea, unspecified: Secondary | ICD-10-CM

## 2022-12-18 DIAGNOSIS — D509 Iron deficiency anemia, unspecified: Secondary | ICD-10-CM | POA: Diagnosis not present

## 2022-12-18 DIAGNOSIS — G4733 Obstructive sleep apnea (adult) (pediatric): Secondary | ICD-10-CM | POA: Diagnosis not present

## 2022-12-18 MED ORDER — FERROUS SULFATE 325 (65 FE) MG PO TABS: 325.0000 mg | ORAL_TABLET | Freq: Two times a day (BID) | ORAL | 2 refills | Status: DC

## 2022-12-18 MED ORDER — SODIUM CHLORIDE 0.9 % IV SOLN: 500.0000 mL | INTRAVENOUS | Status: DC

## 2022-12-18 MED ORDER — FERROUS SULFATE 325 (65 FE) MG PO TABS: 325.0000 mg | ORAL_TABLET | Freq: Two times a day (BID) | ORAL | Status: DC

## 2022-12-18 NOTE — Progress Notes (Signed)
Patient reports no changes in health or medications since office visit.

## 2022-12-18 NOTE — Op Note (Signed)
[place] Patient Name: Brianna Mccarty Procedure Date: 12/18/2022 1:37 PM MRN: [id number] Endoscopist: Ladene Artist , MD, [id number] Age: 82 Referring MD:  Date of Birth: 03-05-1941 Gender: Female Account #: [id number] Procedure:                Upper GI endoscopy Indications:              Unexplained iron deficiency anemia Medicines:                Monitored Anesthesia Care Procedure:                Pre-Anesthesia Assessment:                           - Prior to the procedure, a History and Physical                            was performed, and patient medications and                            allergies were reviewed. The patient's tolerance of                            previous anesthesia was also reviewed. The risks                            and benefits of the procedure and the sedation                            options and risks were discussed with the patient.                            All questions were answered, and informed consent                            was obtained. Prior Anticoagulants: The patient has                            taken no anticoagulant or antiplatelet agents. ASA                            Grade Assessment: III - A patient with severe                            systemic disease. After reviewing the risks and                            benefits, the patient was deemed in satisfactory                            condition to undergo the procedure.                           After obtaining informed consent, the endoscope was  passed under direct vision. Throughout the                            procedure, the patient's blood pressure, pulse, and                            oxygen saturations were monitored continuously. The                            Olympus Scope [id number] was introduced through the                            mouth, and advanced to the second part of duodenum.                            The upper GI  endoscopy was accomplished without                            difficulty. The patient tolerated the procedure                            well. Scope In: Scope Out: Findings:                 The examined esophagus was normal.                           A medium-sized hiatal hernia was present.                           Patchy mildly erythematous mucosa without bleeding                            was found in the gastric body. Biopsies were taken                            with a cold forceps for histology.                           The exam of the stomach was otherwise normal.                           Diffuse moderate mucosal changes characterized by                            black discoloration were found in the duodenal                            bulb. Biopsies were taken with a cold forceps for                            histology.                           The exam of the duodenum was otherwise normal.  Biopsies were taken with a cold forceps for                            histology. Complications:            No immediate complications. Estimated Blood Loss:     Estimated blood loss was minimal. Impression:               - Normal esophagus.                           - Medium-sized hiatal hernia.                           - Erythematous mucosa in the gastric body. Biopsied.                           - Mucosal changes in the duodenal bulb. Biopsied.                           - Otherwise normal appearing duodenum. Biopsied. Recommendation:           - Patient has a contact number available for                            emergencies. The signs and symptoms of potential                            delayed complications were discussed with the                            patient. Return to normal activities tomorrow.                            Written discharge instructions were provided to the                            patient.                           - Resume  previous diet.                           - Continue present medications.                           - FeSO4 325 mg po bid with food, #30, 2 refills                           - Await pathology results. Ladene Artist, MD 12/18/2022 2:29:18 PM This report has been signed electronically.

## 2022-12-18 NOTE — Progress Notes (Signed)
1336 Robinul 0.1 mg IV given due large amount of secretions upon assessment.  MD made aware, vss

## 2022-12-18 NOTE — Progress Notes (Signed)
Called to room to assist during endoscopic procedure.  Patient ID and intended procedure confirmed with present staff. Received instructions for my participation in the procedure from the performing physician.  

## 2022-12-18 NOTE — Op Note (Signed)
[place] Patient Name: Brianna Mccarty Procedure Date: 12/18/2022 1:38 PM MRN: [id number] Endoscopist: Ladene Artist , MD, [id number] Age: 82 Referring MD:  Date of Birth: 05-27-1941 Gender: Female Account #: [id number] Procedure:                Colonoscopy Indications:              Clinically significant diarrhea of unexplained                            origin, Iron deficiency anemia Medicines:                Monitored Anesthesia Care Procedure:                Pre-Anesthesia Assessment:                           - Prior to the procedure, a History and Physical                            was performed, and patient medications and                            allergies were reviewed. The patient's tolerance of                            previous anesthesia was also reviewed. The risks                            and benefits of the procedure and the sedation                            options and risks were discussed with the patient.                            All questions were answered, and informed consent                            was obtained. Prior Anticoagulants: The patient has                            taken no anticoagulant or antiplatelet agents. ASA                            Grade Assessment: III - A patient with severe                            systemic disease. After reviewing the risks and                            benefits, the patient was deemed in satisfactory                            condition to undergo the procedure.  After obtaining informed consent, the colonoscope                            was passed under direct vision. Throughout the                            procedure, the patient's blood pressure, pulse, and                            oxygen saturations were monitored continuously. The                            CF [id number] was introduced through the anus                            and advanced to the the  cecum, identified by                            appendiceal orifice and ileocecal valve. The                            ileocecal valve, appendiceal orifice, and rectum                            were photographed. The quality of the bowel                            preparation was adequate. The colonoscopy was                            performed without difficulty. The patient tolerated                            the procedure well. Scope In: 1:40:22 PM Scope Out: 1:56:34 PM Scope Withdrawal Time: 0 hours 13 minutes 14 seconds  Total Procedure Duration: 0 hours 16 minutes 12 seconds  Findings:                 The perianal and digital rectal examinations were                            normal.                           Two sessile polyps were found in the ascending                            colon. The polyps were 6 to 7 mm in size. These                            polyps were removed with a cold snare. Resection                            and retrieval were complete.  A single medium-sized localized angioectasia                            without bleeding was found in the ascending colon.                           Multiple small-mouthed diverticula were found in                            the left colon. There was no evidence of                            diverticular bleeding.                           Internal hemorrhoids were found during                            retroflexion. The hemorrhoids were small and Grade                            I (internal hemorrhoids that do not prolapse).                           The exam was otherwise without abnormality on                            direct and retroflexion views. Random biopsies from                            ascending, transverse, descending and sigmoid colon. Complications:            No immediate complications. Estimated blood loss:                            None. Estimated Blood Loss:     Estimated  blood loss: none. Impression:               - Two 6 to 7 mm polyps in the ascending colon,                            removed with a cold snare. Resected and retrieved.                           - A single non-bleeding colonic angioectasia.                           - Moderate diverticulosis in the left colon.                           - Internal hemorrhoids.                           - The examination was otherwise normal on direct  and retroflexion views. Biopsied. Recommendation:           - Patient has a contact number available for                            emergencies. The signs and symptoms of potential                            delayed complications were discussed with the                            patient. Return to normal activities tomorrow.                            Written discharge instructions were provided to the                            patient.                           - Resume previous diet.                           - Continue present medications.                           - Await pathology results.                           - No repeat colonoscopy due to age.                           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 Ladene Artist, MD 12/18/2022 2:21:45 PM This report has been signed electronically.

## 2022-12-18 NOTE — Patient Instructions (Signed)
Please read handouts provided. Continue present medications. Await pathology results. FeSO4 325 mg twice daily with food.   YOU HAD AN ENDOSCOPIC PROCEDURE TODAY AT [facility]:   Refer to the procedure report that was given to you for any specific questions about what was found during the examination.  If the procedure report does not answer your questions, please call your gastroenterologist to clarify.  If you requested that your care partner not be given the details of your procedure findings, then the procedure report has been included in a sealed envelope for you to review at your convenience later.  YOU SHOULD EXPECT: Some feelings of bloating in the abdomen. Passage of more gas than usual.  Walking can help get rid of the air that was put into your GI tract during the procedure and reduce the bloating. If you had a lower endoscopy (such as a colonoscopy or flexible sigmoidoscopy) you may notice spotting of blood in your stool or on the toilet paper. If you underwent a bowel prep for your procedure, you may not have a normal bowel movement for a few days.  Please Note:  You might notice some irritation and congestion in your nose or some drainage.  This is from the oxygen used during your procedure.  There is no need for concern and it should clear up in a day or so.  SYMPTOMS TO REPORT IMMEDIATELY:  Following lower endoscopy (colonoscopy or flexible sigmoidoscopy):  Excessive amounts of blood in the stool  Significant tenderness or worsening of abdominal pains  Swelling of the abdomen that is new, acute  Fever of 100F or higher  Following upper endoscopy (EGD)  Vomiting of blood or coffee ground material  New chest pain or pain under the shoulder blades  Painful or persistently difficult swallowing  New shortness of breath  Fever of 100F or higher  Black, tarry-looking stools  For urgent or emergent issues, a gastroenterologist can be reached at any hour by  calling [phone]. Do not use MyChart messaging for urgent concerns.    DIET:  We do recommend a small meal at first, but then you may proceed to your regular diet.  Drink plenty of fluids but you should avoid alcoholic beverages for 24 hours.  ACTIVITY:  You should plan to take it easy for the rest of today and you should NOT DRIVE or use heavy machinery until tomorrow (because of the sedation medicines used during the test).    FOLLOW UP: Our staff will call the number listed on your records the next business day following your procedure.  We will call around 7:15- 8:00 am to check on you and address any questions or concerns that you may have regarding the information given to you following your procedure. If we do not reach you, we will leave a message.     If any biopsies were taken you will be contacted by phone or by letter within the next 1-3 weeks.  Please call us at [phone] if you have not heard about the biopsies in 3 weeks.    SIGNATURES/CONFIDENTIALITY: You and/or your care partner have signed paperwork which will be entered into your electronic medical record.  These signatures attest to the fact that that the information above on your After Visit Summary has been reviewed and is understood.  Full responsibility of the confidentiality of this discharge information lies with you and/or your care-partner.

## 2022-12-18 NOTE — Progress Notes (Signed)
Report given to PACU, vss 

## 2022-12-19 ENCOUNTER — Telehealth: Payer: Self-pay

## 2022-12-19 NOTE — Telephone Encounter (Signed)
  Follow up Call-     12/18/2022    1:04 PM  Call back number  Post procedure Call Back phone  # [phone]  Permission to leave phone message Yes     Patient questions:  Do you have a fever, pain , or abdominal swelling? No. Pain Score  0 *  Have you tolerated food without any problems? Yes.    Have you been able to return to your normal activities? Yes.    Do you have any questions about your discharge instructions: Diet   No. Medications  No. Follow up visit  No.  Do you have questions or concerns about your Care? No.  Actions: * If pain score is 4 or above: No action needed, pain <4.

## 2022-12-24 ENCOUNTER — Other Ambulatory Visit: Payer: Self-pay | Admitting: Family Medicine

## 2022-12-25 DIAGNOSIS — J301 Allergic rhinitis due to pollen: Secondary | ICD-10-CM | POA: Diagnosis not present

## 2022-12-25 DIAGNOSIS — J3089 Other allergic rhinitis: Secondary | ICD-10-CM | POA: Diagnosis not present

## 2022-12-26 ENCOUNTER — Other Ambulatory Visit: Payer: Self-pay | Admitting: Gastroenterology

## 2022-12-30 ENCOUNTER — Telehealth: Payer: Self-pay | Admitting: Cardiology

## 2022-12-30 NOTE — Telephone Encounter (Signed)
Spoke with pt who reports this AM she woke feeling poorly.  She took her HR and it was 145 bpm.  Typically she takes her Metoprolol Tartrate 25 mg 1/2 tablet every evening.  This morning with her HR elevated she took another 1/2 tablet.  After waiting a bit longer, heart rate didn't really improve by her report and she took another 1/2 tablet (having had a whole tablet today). She reports at this time HR is 113-115 bpm and BP 131/80.  She is feeling some better now but would like to know when it would be OK to take more metoprolol if needed.  She reports HX of tachycardia but no At Fib than she is aware of.  HR feels regular, no palpitations. She reports eating/drinking OK since her colonoscopy/endoscopy on 2/14.  Has had no GI s/s.  No dehydration, n/v, or diarrhea.  Advised I will forward this information to Dr Johney Frame and her nurse for review and she will be called back with further instructions/orders.

## 2022-12-30 NOTE — Telephone Encounter (Signed)
STAT if HR is under 50 or over 120 (normal HR is 60-100 beats per minute)  What is your heart rate?  117  Do you have a log of your heart rate readings (document readings)?  2/26: 145, 117  Do you have any other symptoms?   No, but patient mentions recently having a colonoscopy.

## 2022-12-31 ENCOUNTER — Encounter: Payer: Self-pay | Admitting: Gastroenterology

## 2022-12-31 DIAGNOSIS — J3089 Other allergic rhinitis: Secondary | ICD-10-CM | POA: Diagnosis not present

## 2022-12-31 DIAGNOSIS — J3081 Allergic rhinitis due to animal (cat) (dog) hair and dander: Secondary | ICD-10-CM | POA: Diagnosis not present

## 2022-12-31 DIAGNOSIS — J301 Allergic rhinitis due to pollen: Secondary | ICD-10-CM | POA: Diagnosis not present

## 2023-01-03 ENCOUNTER — Telehealth: Payer: Self-pay | Admitting: Gastroenterology

## 2023-01-03 NOTE — Telephone Encounter (Signed)
PT recently had colonoscopy/EGD on2/14 and would like to discuss the biopsy report from [place]. She doesn't understand some thing and would like futher clarification. Please advise.

## 2023-01-03 NOTE — Telephone Encounter (Signed)
The pt has been advised of the letter and findings no further questions at this time.    ASANI UNSWORTH         12/31/2022 [place] [id number]         Dear [name],  The polyps removed from your colon were benign, however one was precancerous. This means that it had the potential to change into cancer over time had it not been removed. The colon biopsies to evaluate your diarrhea were normal.   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 The biopsies from your stomach showed a reactive gastropathy which is a benign irritation of the stomach lining.   If you develop any new rectal bleeding, abdominal pain or significant bowel habit changes, please contact our office at [phone]  Please call us if you have persistent problems or have questions about your condition that have not been fully answered at this time.  Sincerely,  Ladene Artist, MD

## 2023-01-07 DIAGNOSIS — D485 Neoplasm of uncertain behavior of skin: Secondary | ICD-10-CM | POA: Diagnosis not present

## 2023-01-07 DIAGNOSIS — Z1283 Encounter for screening for malignant neoplasm of skin: Secondary | ICD-10-CM | POA: Diagnosis not present

## 2023-01-07 DIAGNOSIS — D2261 Melanocytic nevi of right upper limb, including shoulder: Secondary | ICD-10-CM | POA: Diagnosis not present

## 2023-01-07 DIAGNOSIS — X32XXXD Exposure to sunlight, subsequent encounter: Secondary | ICD-10-CM | POA: Diagnosis not present

## 2023-01-07 DIAGNOSIS — L820 Inflamed seborrheic keratosis: Secondary | ICD-10-CM | POA: Diagnosis not present

## 2023-01-07 DIAGNOSIS — L570 Actinic keratosis: Secondary | ICD-10-CM | POA: Diagnosis not present

## 2023-01-08 DIAGNOSIS — H6123 Impacted cerumen, bilateral: Secondary | ICD-10-CM | POA: Diagnosis not present

## 2023-01-14 DIAGNOSIS — J3081 Allergic rhinitis due to animal (cat) (dog) hair and dander: Secondary | ICD-10-CM | POA: Diagnosis not present

## 2023-01-14 DIAGNOSIS — J301 Allergic rhinitis due to pollen: Secondary | ICD-10-CM | POA: Diagnosis not present

## 2023-01-14 DIAGNOSIS — J3089 Other allergic rhinitis: Secondary | ICD-10-CM | POA: Diagnosis not present

## 2023-01-21 DIAGNOSIS — J3081 Allergic rhinitis due to animal (cat) (dog) hair and dander: Secondary | ICD-10-CM | POA: Diagnosis not present

## 2023-01-21 DIAGNOSIS — J301 Allergic rhinitis due to pollen: Secondary | ICD-10-CM | POA: Diagnosis not present

## 2023-01-21 DIAGNOSIS — L570 Actinic keratosis: Secondary | ICD-10-CM | POA: Diagnosis not present

## 2023-01-21 DIAGNOSIS — J3089 Other allergic rhinitis: Secondary | ICD-10-CM | POA: Diagnosis not present

## 2023-01-21 DIAGNOSIS — X32XXXA Exposure to sunlight, initial encounter: Secondary | ICD-10-CM | POA: Diagnosis not present

## 2023-01-28 DIAGNOSIS — J3081 Allergic rhinitis due to animal (cat) (dog) hair and dander: Secondary | ICD-10-CM | POA: Diagnosis not present

## 2023-01-28 DIAGNOSIS — J3089 Other allergic rhinitis: Secondary | ICD-10-CM | POA: Diagnosis not present

## 2023-01-28 DIAGNOSIS — J301 Allergic rhinitis due to pollen: Secondary | ICD-10-CM | POA: Diagnosis not present

## 2023-01-29 ENCOUNTER — Encounter (INDEPENDENT_AMBULATORY_CARE_PROVIDER_SITE_OTHER): Payer: HMO | Admitting: Ophthalmology

## 2023-01-29 DIAGNOSIS — H43813 Vitreous degeneration, bilateral: Secondary | ICD-10-CM | POA: Diagnosis not present

## 2023-01-29 DIAGNOSIS — H35342 Macular cyst, hole, or pseudohole, left eye: Secondary | ICD-10-CM | POA: Diagnosis not present

## 2023-01-29 DIAGNOSIS — D3132 Benign neoplasm of left choroid: Secondary | ICD-10-CM | POA: Diagnosis not present

## 2023-01-29 DIAGNOSIS — I10 Essential (primary) hypertension: Secondary | ICD-10-CM

## 2023-01-29 DIAGNOSIS — H33303 Unspecified retinal break, bilateral: Secondary | ICD-10-CM | POA: Diagnosis not present

## 2023-01-29 DIAGNOSIS — H35033 Hypertensive retinopathy, bilateral: Secondary | ICD-10-CM

## 2023-01-29 DIAGNOSIS — H353132 Nonexudative age-related macular degeneration, bilateral, intermediate dry stage: Secondary | ICD-10-CM | POA: Diagnosis not present

## 2023-01-29 DIAGNOSIS — D3131 Benign neoplasm of right choroid: Secondary | ICD-10-CM | POA: Diagnosis not present

## 2023-02-04 DIAGNOSIS — J3089 Other allergic rhinitis: Secondary | ICD-10-CM | POA: Diagnosis not present

## 2023-02-04 DIAGNOSIS — J301 Allergic rhinitis due to pollen: Secondary | ICD-10-CM | POA: Diagnosis not present

## 2023-02-06 DIAGNOSIS — J301 Allergic rhinitis due to pollen: Secondary | ICD-10-CM | POA: Diagnosis not present

## 2023-02-06 DIAGNOSIS — J3089 Other allergic rhinitis: Secondary | ICD-10-CM | POA: Diagnosis not present

## 2023-02-13 ENCOUNTER — Telehealth: Payer: Self-pay | Admitting: Family Medicine

## 2023-02-13 DIAGNOSIS — J301 Allergic rhinitis due to pollen: Secondary | ICD-10-CM | POA: Diagnosis not present

## 2023-02-13 DIAGNOSIS — J3089 Other allergic rhinitis: Secondary | ICD-10-CM | POA: Diagnosis not present

## 2023-02-13 DIAGNOSIS — Z6830 Body mass index (BMI) 30.0-30.9, adult: Secondary | ICD-10-CM | POA: Diagnosis not present

## 2023-02-13 DIAGNOSIS — M48061 Spinal stenosis, lumbar region without neurogenic claudication: Secondary | ICD-10-CM | POA: Diagnosis not present

## 2023-02-13 NOTE — Telephone Encounter (Signed)
Called patient to schedule Medicare Annual Wellness Visit (AWV). Left message for patient to call back and schedule Medicare Annual Wellness Visit (AWV).  Last date of AWV: 04/15/22  Please schedule an appointment at any time with [facility].  If any questions, please contact me at [phone].  Thank you ,  Rudell Cobb AWV direct phone # [phone]

## 2023-02-19 DIAGNOSIS — J3081 Allergic rhinitis due to animal (cat) (dog) hair and dander: Secondary | ICD-10-CM | POA: Diagnosis not present

## 2023-02-19 DIAGNOSIS — J301 Allergic rhinitis due to pollen: Secondary | ICD-10-CM | POA: Diagnosis not present

## 2023-02-19 DIAGNOSIS — J3089 Other allergic rhinitis: Secondary | ICD-10-CM | POA: Diagnosis not present

## 2023-02-23 ENCOUNTER — Other Ambulatory Visit: Payer: Self-pay | Admitting: Family Medicine

## 2023-02-24 ENCOUNTER — Other Ambulatory Visit: Payer: Self-pay | Admitting: Family Medicine

## 2023-02-27 DIAGNOSIS — M5416 Radiculopathy, lumbar region: Secondary | ICD-10-CM | POA: Diagnosis not present

## 2023-02-28 ENCOUNTER — Telehealth: Payer: Self-pay | Admitting: Family Medicine

## 2023-02-28 ENCOUNTER — Other Ambulatory Visit (HOSPITAL_COMMUNITY): Payer: Self-pay | Admitting: General Surgery

## 2023-02-28 DIAGNOSIS — K862 Cyst of pancreas: Secondary | ICD-10-CM

## 2023-02-28 NOTE — Telephone Encounter (Signed)
Error. Please disregard

## 2023-02-28 NOTE — Telephone Encounter (Signed)
Prescription Request  02/28/2023  LOV: 07/11/2022  What is the name of the medication or equipment? prazosin (MINIPRESS) 5 MG capsule   Pt states she is completely out of this medication. Pt is worried because her appointment is not until 03/24/23. Pt is asking that MD please send her at least a 30 day supply until she comes in on the 20th of May.  Please advise.  Have you contacted your pharmacy to request a refill? Yes   Which pharmacy would you like this sent to?  [id number] [place], [place] - [address] [place] [address] Phone: [phone] Fax: [phone]    Patient notified that their request is being sent to the clinical staff for review and that they should receive a response within 2 business days.   Please advise at Mobile [phone] (mobile)

## 2023-03-04 ENCOUNTER — Encounter: Payer: Self-pay | Admitting: Nurse Practitioner

## 2023-03-04 ENCOUNTER — Ambulatory Visit (INDEPENDENT_AMBULATORY_CARE_PROVIDER_SITE_OTHER): Payer: HMO | Admitting: Nurse Practitioner

## 2023-03-04 VITALS — BP 130/76 | Ht 60.25 in | Wt 164.0 lb

## 2023-03-04 DIAGNOSIS — Z9189 Other specified personal risk factors, not elsewhere classified: Secondary | ICD-10-CM

## 2023-03-04 DIAGNOSIS — M810 Age-related osteoporosis without current pathological fracture: Secondary | ICD-10-CM

## 2023-03-04 DIAGNOSIS — Z780 Asymptomatic menopausal state: Secondary | ICD-10-CM

## 2023-03-04 DIAGNOSIS — L900 Lichen sclerosus et atrophicus: Secondary | ICD-10-CM

## 2023-03-04 DIAGNOSIS — B009 Herpesviral infection, unspecified: Secondary | ICD-10-CM | POA: Diagnosis not present

## 2023-03-04 DIAGNOSIS — Z01419 Encounter for gynecological examination (general) (routine) without abnormal findings: Secondary | ICD-10-CM

## 2023-03-04 NOTE — Progress Notes (Signed)
Brianna Mccarty 1941-02-06 [id number]   History:  82 y.o. G2P2002 presents for breast and pelvic exam without GYN complaints. Postmenopausal - no HRT, no bleeding. Cryosurgery for abnormal pap >40 years ago, subsequent paps normal. Lichen Sclerosus managed well with Betamethasone ointment as needed. Normal mammogram history. Osteoporosis managed by PCP, was restarted of Fosamax July 2021. HSV 1, occasional outbreaks.   Gynecologic History No LMP recorded. Patient is postmenopausal.   Contraception: post menopausal status Sexually active: No  Health maintenance Last Pap: 07/03/2015. Results were: Normal Last mammogram: 04/11/2022. Results were: Normal Last colonoscopy: 12/18/2022. Results were; Tubular adenomas, no follow up recommended d/t age Last Dexa: 12/10/2022. Stable  Past medical history, past surgical history, family history and social history were all reviewed and documented in the EPIC chart. Married. 2 children. Involved in her church. 2 great grandchildren ages 90 and 44, one on the way.   ROS:  A ROS was performed and pertinent positives and negatives are included.  Exam:  Vitals:   03/04/23 1418  BP: 130/76  Weight: 164 lb (74.4 kg)  Height: 5' 0.25" (1.53 m)     Body mass index is 31.76 kg/m.  General appearance:  Normal Thyroid:  Symmetrical, normal in size, without palpable masses or nodularity. Respiratory  Auscultation:  Clear without wheezing or rhonchi Cardiovascular  Auscultation:  Regular rate, without rubs, murmurs or gallops  Edema/varicosities:  Not grossly evident Abdominal  Soft,nontender, without masses, guarding or rebound.  Liver/spleen:  No organomegaly noted  Hernia:  None appreciated  Skin  Inspection:  Grossly normal Breasts: Examined lying and sitting.   Right: Without masses, retractions, discharge or axillary adenopathy.   Left: Without masses, retractions, discharge or axillary adenopathy. Genitourinary   Inguinal/mons:  Normal  without inguinal adenopathy  External genitalia:  Normal appearing vulva with no masses, tenderness, or lesions  BUS/Urethra/Skene's glands:  Normal  Vagina:  Normal appearing with normal color and discharge, no lesions. Atrophic changes  Cervix:  Normal appearing without discharge or lesions  Uterus:  Normal in size, shape and contour.  Midline and mobile, nontender  Adnexa/parametria:     Rt: Normal in size, without masses or tenderness.   Lt: Normal in size, without masses or tenderness.  Anus and perineum: Normal  Digital rectal exam: Deferred  Patient informed chaperone available to be present for breast and pelvic exam. Patient has requested no chaperone to be present. Patient has been advised what will be completed during breast and pelvic exam.   Patient informed chaperone available to be present for breast and pelvic exam. Patient has requested no chaperone to be present. Patient has been advised what will be completed during breast and pelvic exam.   Assessment/Plan:  82 y.o. G2P2002 for breast and pelvic exam.   Encounter for breast and pelvic examination - Education provided on SBEs, importance of preventative screenings, current guidelines, high calcium diet, regular exercise, and multivitamin daily. Labs with PCP.   Postmenopausal - no HRT, no bleeding  Age-related osteoporosis without current pathological fracture - PCP manages. Restarted on Fosamax July 2021.   HSV-1 infection - Scyclovir (ZOVIRAX) 400 MG tablet 5x per day as needed for outbreaks. Rare use. Does not need refill at this time.   Lichen sclerosus - managed well with Betamethasone ointment as needed. No refill needed today.   Screening for cervical cancer - Cryosurgery >40 years ago, subsequent paps normal. No longer screening per guidelines.   Screening for breast cancer - Normal mammogram history.  Continue  annual screenings.  Normal breast exam today.  Screening for colon cancer - 12/2022 colonoscopy for  problem. Screenings no longer recommended d/t age per GI.  Return in 1 year for breast and pelvic exam.      Olivia Mackie DNP, 2:45 PM 03/04/2023

## 2023-03-06 DIAGNOSIS — J3089 Other allergic rhinitis: Secondary | ICD-10-CM | POA: Diagnosis not present

## 2023-03-06 DIAGNOSIS — J301 Allergic rhinitis due to pollen: Secondary | ICD-10-CM | POA: Diagnosis not present

## 2023-03-06 DIAGNOSIS — J3081 Allergic rhinitis due to animal (cat) (dog) hair and dander: Secondary | ICD-10-CM | POA: Diagnosis not present

## 2023-03-07 ENCOUNTER — Other Ambulatory Visit: Payer: Self-pay | Admitting: Family Medicine

## 2023-03-11 DIAGNOSIS — J301 Allergic rhinitis due to pollen: Secondary | ICD-10-CM | POA: Diagnosis not present

## 2023-03-11 DIAGNOSIS — J3089 Other allergic rhinitis: Secondary | ICD-10-CM | POA: Diagnosis not present

## 2023-03-11 DIAGNOSIS — J3081 Allergic rhinitis due to animal (cat) (dog) hair and dander: Secondary | ICD-10-CM | POA: Diagnosis not present

## 2023-03-20 DIAGNOSIS — J3081 Allergic rhinitis due to animal (cat) (dog) hair and dander: Secondary | ICD-10-CM | POA: Diagnosis not present

## 2023-03-20 DIAGNOSIS — J3089 Other allergic rhinitis: Secondary | ICD-10-CM | POA: Diagnosis not present

## 2023-03-20 DIAGNOSIS — J301 Allergic rhinitis due to pollen: Secondary | ICD-10-CM | POA: Diagnosis not present

## 2023-03-20 NOTE — Progress Notes (Signed)
Care Management & Coordination Services Pharmacy Note  03/24/2023 Name:  Brianna Mccarty MRN:  [id number] DOB:  1941-04-13  Summary: BP at goal <140/90 LDL not at goal <100, since started nexlizet  Recommendations/Changes made from today's visit: -Continue routine monitoring of BP at home and keep a log -Need for updated lipid panel since starting Nexlizet, cardio has already ordered -Pursuing Nexlizet financial assistance through [employer]  Follow up plan: Cholesterol review in 3 months Pharmacist visit in 6-7 months   Subjective: Brianna Mccarty is an 82 y.o. year old female who is a primary patient of [name], MD.  The care coordination team was consulted for assistance with disease management and care coordination needs.    Engaged with patient by telephone for follow up visit.  Recent office visits: None  Recent consult visits: 03/04/23 Wyline Beady, NP (Gynecology) - For breast and pelvic exam. No med changes  12/18/22 Claudette Head [facility]) - Endoscopy and colonoscopy, START Iron 325mg  BID  11/28/22 Vergia Alberts - Patient presented for Allergic rhinitis due to animal hair and other concerns. No medication changes.    11/20/22 Vergia Alberts - Patient presented for Allergic rhinitis due to animal hair and other concerns. No medication changes.    11/11/22 Arnaldo Natal, NP [facility]) - Patient presented for Anemia unspecified type and other concerns. Stopped Benzonatate.  Hospital visits: 10/18/22  - For viral URI, LOS 39 min, START Prednisone and Tessalon   Objective:  Lab Results  Component Value Date   CREATININE 0.80 08/07/2022   BUN 15 08/07/2022   GFR 68.47 08/07/2022   EGFR 69 04/24/2022   GFRNONAA >60 10/21/2021   GFRAA 84 07/24/2020   NA 139 08/07/2022   K 3.8 08/07/2022   CALCIUM 9.2 08/07/2022   CO2 29 08/07/2022   GLUCOSE 132 (H) 08/07/2022   Lab Results  Component Value Date/Time   HGBA1C 5.9 03/20/2022 03:32 PM    HGBA1C 5.9 11/09/2021 09:38 AM   GFR 68.47 08/07/2022 03:19 PM   GFR 43.51 (L) 03/20/2022 03:32 PM    Last diabetic Eye exam:  Lab Results  Component Value Date/Time   HMDIABEYEEXA No Retinopathy 07/10/2016 12:00 AM    Last diabetic Foot exam: No results found for: "HMDIABFOOTEX"   Lab Results  Component Value Date   CHOL 192 07/26/2022   HDL 76 07/26/2022   LDLCALC 109 (H) 07/26/2022   LDLDIRECT 126.6 07/18/2010   TRIG 35 07/26/2022   CHOLHDL 2.5 07/26/2022       Latest Ref Rng & Units 08/07/2022    3:19 PM 07/26/2022   11:47 AM 04/24/2022    7:24 AM  Hepatic Function  Total Protein 6.0 - 8.3 g/dL 6.1  6.3  5.7   Albumin 3.5 - 5.2 g/dL 3.9  4.4  4.1   AST 0 - 37 U/L 12  12  14    ALT 0 - 35 U/L 7  7  9    Alk Phosphatase 39 - 117 U/L 31  36  39   Total Bilirubin 0.2 - 1.2 mg/dL 0.3  0.3  0.3   Bilirubin, Direct 0.00 - 0.40 mg/dL  8.29      Lab Results  Component Value Date/Time   TSH 1.94 03/20/2022 03:32 PM   TSH 0.97 11/09/2021 09:38 AM       Latest Ref Rng & Units 11/11/2022    2:38 PM 08/07/2022    3:19 PM 03/20/2022    3:32 PM  CBC  WBC 4.0 - 10.5 K/uL 5.9  4.9  8.5   Hemoglobin 12.0 - 15.0 g/dL 16.1  09.6  04.5   Hematocrit 36.0 - 46.0 % 37.1  33.3  34.9   Platelets 150.0 - 400.0 K/uL 196.0  216.0  221.0     Lab Results  Component Value Date/Time   VD25OH 29.28 (L) 11/09/2021 09:38 AM   VD25OH 31 05/12/2020 02:48 PM   VD25OH 36.7 05/31/2019 09:52 AM   VITAMINB12 >1500 (H) 11/11/2022 02:38 PM   VITAMINB12 926 (H) 03/20/2022 03:32 PM    Clinical ASCVD: No  The ASCVD Risk score (Arnett DK, et al., 2019) failed to calculate for the following reasons:   The 2019 ASCVD risk score is only valid for ages 20 to 81    DEXA: 12/10/22 - shows improvement on tx w/ fosamax and Vit D     09/02/2022    2:15 PM 06/28/2022   12:58 PM 04/15/2022    2:04 PM  Depression screen PHQ 2/9  Decreased Interest 0 0 0  Down, Depressed, Hopeless 0 0 0  PHQ - 2 Score 0 0 0   Altered sleeping  0   Tired, decreased energy  1   Change in appetite  0   Feeling bad or failure about yourself   0   Trouble concentrating  0   Moving slowly or fidgety/restless  0   Suicidal thoughts  0   PHQ-9 Score  1   Difficult doing work/chores  Not difficult at all      Social History   Tobacco Use  Smoking Status Never   Passive exposure: Never  Smokeless Tobacco Never   BP Readings from Last 3 Encounters:  03/04/23 130/76  12/18/22 (!) 154/76  11/11/22 (!) 160/80   Pulse Readings from Last 3 Encounters:  12/18/22 61  11/11/22 (!) 57  10/18/22 70   Wt Readings from Last 3 Encounters:  03/04/23 164 lb (74.4 kg)  12/18/22 164 lb (74.4 kg)  11/11/22 164 lb (74.4 kg)   BMI Readings from Last 3 Encounters:  03/04/23 31.76 kg/m  12/18/22 30.00 kg/m  11/11/22 30.00 kg/m    Allergies  Allergen Reactions   Celebrex [Celecoxib] Diarrhea   Cephalexin     Reaction was a high fever   Hydrochlorothiazide     hyponatremia   Penicillins Hives and Swelling    Swelling of arms & face Has patient had a PCN reaction causing immediate rash, facial/tongue/throat swelling, SOB or lightheadedness with hypotension: Yes Has patient had a PCN reaction causing severe rash involving mucus membranes or skin necrosis: No Has patient had a PCN reaction that required hospitalization: No Has patient had a PCN reaction occurring within the last 10 years: No If all of the above answers are "NO", then may proceed with Cephalosporin use.    Phenobarbital Hives   Statins     Muscle pain   Passion Fruit Flavor Diarrhea and Rash   Tape Rash    MEDICAL TAPE    Medications Reviewed Today     Reviewed by Sherrill Raring, RPH (Pharmacist) on 03/24/23 at 1111  Med List Status: <None>   Medication Order Taking? Sig Documenting Provider Last Dose Status Informant  acyclovir (ZOVIRAX) 400 MG tablet [id number] No Take 1 tablet (400 mg total) by mouth 5 (five) times daily as needed  (fever blisters). Olivia Mackie, NP Taking Active   albuterol [facility] HFA) 108 (90 Base) MCG/ACT inhaler [id number] No Inhale 2 puffs into  the lungs every 6 (six) hours as needed for wheezing. Michele Mcalpine, MD Taking Active Self  albuterol (PROVENTIL) (2.5 MG/3ML) 0.083% nebulizer solution [id number] No Take 3 mLs (2.5 mg total) by nebulization every 6 (six) hours as needed. Michele Mcalpine, MD Taking Active Self  alendronate (FOSAMAX) 70 MG tablet [id number] No TAKE 1 TABLET EVERY 7 (SEVEN) DAYS. TAKE WITH A FULL GLASS OF WATER ON AN EMPTY STOMACH. [name], MD Taking Active   amLODipine (NORVASC) 10 MG tablet [id number] No Take 1 tablet (10 mg total) by mouth daily. [name], MD Taking Active   azelastine (ASTELIN) 0.1 % nasal spray [id number] No Instill 2 sprays into each nostril two times a day as needed for allergies Bing Neighbors, NP Taking Active Self  Bempedoic Acid-Ezetimibe (NEXLIZET) 180-10 MG TABS [id number] No Take 1 tablet by mouth daily. Meriam Sprague, MD Taking Active   betamethasone dipropionate 0.05 % cream [id number] No Apply 1 application topically 2 (two) times daily as needed (scaly skin). [provider] Taking Active Self  budesonide-formoterol (SYMBICORT) 160-4.5 MCG/ACT inhaler [id number] No Inhale 2 puffs into the lungs 2 (two) times daily. Wynn Banker, MD Taking Active   Cholecalciferol 125 MCG (5000 UT) TABS [id number] No Take 1 tablet by mouth daily. [provider] Taking Active   desmopressin (DDAVP) 0.2 MG tablet [id number] No Take 0.2 mg by mouth at bedtime. [provider] Taking Active Self  EPINEPHrine 0.3 mg/0.3 mL IJ SOAJ injection [id number] No Inject 0.3 mg into the muscle as needed for anaphylaxis. [provider] Taking Active Self  ferrous sulfate 325 (65 FE) MG tablet [id number] No TAKE 1 TABLET BY MOUTH 2 TIMES DAILY WITH A MEAL. Meryl Dare, MD Taking Active   fexofenadine [facility])  180 MG tablet [id number] No Take 180 mg by mouth daily. [provider] Taking Active Self  furosemide (LASIX) 20 MG tablet [id number] No TAKE 1 TABLET (20 MG TOTAL) BY MOUTH DAILY AS NEEDED FOR EDEMA. [name], MD Taking Active   hydrALAZINE (APRESOLINE) 50 MG tablet [id number] No TAKE 1 TAB 3 TIMES A DAY AND TAKE EXTRA TAB IF SBP>160 Meriam Sprague, MD Taking Active   hydrOXYzine (ATARAX/VISTARIL) 10 MG tablet [id number] No Take 10 mg by mouth at bedtime.  [provider] Taking Active Self  losartan (COZAAR) 100 MG tablet [id number]  TAKE 1 TABLET BY MOUTH EVERY DAY [name], MD  Active   METAMUCIL FIBER PO [id number] No Take 1 capsule by mouth once a week. [provider] Taking Active Self  metoprolol tartrate (LOPRESSOR) 25 MG tablet [id number] No Take 12.5 mg by mouth daily. Take half a tablet daily [provider] Taking Active Self  montelukast (SINGULAIR) 10 MG tablet [id number] No Take 10 mg by mouth at bedtime. [provider] Taking Active Self  omeprazole (PRILOSEC) 40 MG capsule [id number] No TAKE 1 CAPSULE (40 MG TOTAL) BY MOUTH DAILY. [name], MD Taking Active   pentosan polysulfate (ELMIRON) 100 MG capsule [id number] No Take 100 mg by mouth daily. [provider] Taking Active Self  prazosin (MINIPRESS) 5 MG capsule [id number] No *NEED OFFICE VISIT* TAKE 1 CAPSULE BY MOUTH TWICE A DAY [name], MD Taking Active   Probiotic Product (PROBIOTIC PO) [id number] No Take 1 capsule by mouth daily.  [provider] Taking Active Self            SDOH:  (Social Determinants of Health)  assessments and interventions performed: Yes SDOH Interventions    Flowsheet Row Care Coordination from 03/24/2023 in [facility] Chronic Care Management from 10/01/2022 in [facility] [place] Telephone from 09/02/2022 in [facility] Community Care Coordination  Chronic Care Management from 10/01/2021 in [facility] [place] Clinical Support from 12/08/2018 in [facility] [place]  SDOH Interventions       Food Insecurity Interventions Intervention Not Indicated -- Intervention Not Indicated -- --  Housing Interventions Intervention Not Indicated -- Intervention Not Indicated -- --  Transportation Interventions -- -- Intervention Not Indicated Intervention Not Indicated --  Utilities Interventions -- -- Intervention Not Indicated -- --  Depression Interventions/Treatment  -- -- -- -- PHQ2-9 Score <4 Follow-up Not Indicated  Financial Strain Interventions -- Intervention Not Indicated -- Other (Comment)  [working on PAP] --       Medication Assistance: [employer] grant for [employer] pending  Medication Access: Within the past 30 days, how often has patient missed a dose of medication? None Is a pillbox or other method used to improve adherence? Yes  Factors that may affect medication adherence? financial need Are meds synced by current pharmacy? No  Are meds delivered by current pharmacy? No  Name and location of Current pharmacy:  [id number] [place], [place] - [address] [place] [address] Phone: [phone] Fax: [phone]  Does patient experience delays in picking up medications due to transportation concerns? No   Compliance/Adherence/Medication fill history: Care Gaps: COVID Vaccine Booster - last 09/24/2021  Star-Rating Drugs: Losartan 100mg  PDC 100%   Assessment/Plan Hypertension (BP goal <140/90) -Controlled -Current treatment: Amlodipine 10mg  1 qd Appropriate, Effective, Safe, Accessible Lasix 20mg  1 prn edema Appropriate, Effective, Safe, Accessible Hydralazine 50mg  1 tab TID and extra tab if SBP >160 Appropriate, Effective, Safe, Accessible Losartan 100mg  1 qd Appropriate, Effective, Safe, Accessible Metoprolol tartrate 25mg  1/2 tab qd  Appropriate, Effective, Safe, Accessible Prazosin 5mg  1 BID Appropriate, Effective, Safe, Accessible -Medications previously tried: HCTZ, Spironolactone  -Current home readings: 139/80 HR 56, USUALLY IN THE 120s/60s -Current dietary habits: mindful of salt intake -Current exercise habits: not discussed -Denies hypotensive/hypertensive symptoms -Educated on BP goals and benefits of medications for prevention of heart attack, stroke and kidney damage; Daily salt intake goal < 2300 mg; Exercise goal of 150 minutes per week; Importance of home blood pressure monitoring; Proper BP monitoring technique; -Counseled to monitor BP at home daily, document, and provide log at future appointments -Recommended to continue current medication  Hyperlipidemia: (LDL goal < 100) -Uncontrolled -Current treatment: Nexlizet 180-10mg  1 qd Appropriate, Query effective -Medications previously tried: rosuvastatin, simvastatin  -Current dietary patterns: not discussed -Current exercise habits: not discussed -Educated on Cholesterol goals;  Importance of limiting foods high in cholesterol; -Recommended to continue current medication Recommended updated lipid panel to assess nexlizet impact, already ordered by cardio Assessed patient finances. [employer] enrollment pending  Sherrill Raring Clinical Pharmacist [phone]

## 2023-03-21 ENCOUNTER — Telehealth: Payer: Self-pay

## 2023-03-21 DIAGNOSIS — N3010 Interstitial cystitis (chronic) without hematuria: Secondary | ICD-10-CM | POA: Diagnosis not present

## 2023-03-21 DIAGNOSIS — N281 Cyst of kidney, acquired: Secondary | ICD-10-CM | POA: Diagnosis not present

## 2023-03-21 NOTE — Progress Notes (Signed)
Patient ID: Brianna Mccarty, female   DOB: 02/02/1941, 82 y.o.   MRN: [id number] Care Management & Coordination Services Pharmacy Team  Reason for Encounter: Appointment Reminder  Contacted patient to confirm telephone appointment with Milas Kocher, PharmD on 03/24/23 at 11. Unsuccessful outreach. Left voicemail for patient to return call.   Hospital visits:  Medication Reconciliation was completed by comparing discharge summary, patient's EMR and Pharmacy list, and upon discussion with patient.   Patient presented for Viral Upper respiratory ract infection with cough on 10/18/22 Patient was present for 39 min.     New?Medications Started at [facility] Discharge:?? -started   predniSONE 40 m  Benzonatate 100 mg   Medication Changes at Hospital Discharge: -Changed  none   Medications Discontinued at Hospital Discharge: -Stopped  none     Star Rating Drugs:  Losartan 100 mg - Last filled 03/07/23 90 DS    Care Gaps: Covid Vaccine - Overdue AWV - 05/15/22   Pamala Duffel CMA Clinical Pharmacist Assistant [phone]

## 2023-03-24 ENCOUNTER — Ambulatory Visit: Payer: HMO

## 2023-03-24 ENCOUNTER — Encounter: Payer: Self-pay | Admitting: Family Medicine

## 2023-03-24 ENCOUNTER — Ambulatory Visit (INDEPENDENT_AMBULATORY_CARE_PROVIDER_SITE_OTHER): Payer: HMO | Admitting: Family Medicine

## 2023-03-24 ENCOUNTER — Telehealth: Payer: Self-pay | Admitting: *Deleted

## 2023-03-24 VITALS — BP 142/60 | HR 53 | Temp 98.2°F | Ht 60.25 in | Wt 165.1 lb

## 2023-03-24 DIAGNOSIS — E611 Iron deficiency: Secondary | ICD-10-CM

## 2023-03-24 DIAGNOSIS — I10 Essential (primary) hypertension: Secondary | ICD-10-CM | POA: Diagnosis not present

## 2023-03-24 DIAGNOSIS — R7303 Prediabetes: Secondary | ICD-10-CM

## 2023-03-24 DIAGNOSIS — E559 Vitamin D deficiency, unspecified: Secondary | ICD-10-CM | POA: Diagnosis not present

## 2023-03-24 LAB — HEMOGLOBIN A1C: Hgb A1c MFr Bld: 5.8 % (ref 4.6–6.5)

## 2023-03-24 MED ORDER — AMLODIPINE BESYLATE 10 MG PO TABS: 10.0000 mg | ORAL_TABLET | Freq: Every day | ORAL | 1 refills | Status: DC

## 2023-03-24 MED ORDER — PRAZOSIN HCL 5 MG PO CAPS: 5.0000 mg | ORAL_CAPSULE | Freq: Two times a day (BID) | ORAL | 1 refills | Status: DC

## 2023-03-24 NOTE — Progress Notes (Cosign Needed)
[employer] Card info for Nexlizet ID # [id number] Grp: [id number] BIN: [id number] PCN: [id number]

## 2023-03-24 NOTE — Telephone Encounter (Signed)
-----   Message from Sherrill Raring, [facility] sent at 03/24/2023  1:38 PM EDT ----- Regarding: 2300-Pharmacy Referral Pt was previously being managed by pharmacist Maddie and I have taken over her pharmacy management needs.  Looks like the referral order was never placed or fell off for pharmacy services.  Can a 2300-Pharmacy referral be placed so I can continue to assist with patient care?  Thank you! Sherrill Raring Clinical Pharmacist [phone]

## 2023-03-24 NOTE — Telephone Encounter (Signed)
Ok to place referral.

## 2023-03-24 NOTE — Assessment & Plan Note (Signed)
Current hypertension medications:       Sig   furosemide (LASIX) 20 MG tablet (Taking) TAKE 1 TABLET (20 MG TOTAL) BY MOUTH DAILY AS NEEDED FOR EDEMA.   hydrALAZINE (APRESOLINE) 50 MG tablet (Taking) TAKE 1 TAB 3 TIMES A DAY AND TAKE EXTRA TAB IF SBP>160   losartan (COZAAR) 100 MG tablet (Taking) TAKE 1 TABLET BY MOUTH EVERY DAY   metoprolol tartrate (LOPRESSOR) 25 MG tablet (Taking) Take 12.5 mg by mouth daily. Take half a tablet daily   amLODipine (NORVASC) 10 MG tablet Take 1 tablet (10 mg total) by mouth daily.   prazosin (MINIPRESS) 5 MG capsule Take 1 capsule (5 mg total) by mouth 2 (two) times daily.      BP is elevated today in office. I rechecked it and got 160/90. Pt is missing her prazosin 5 mg BID. I have refilled it for her today. She will continue to check her BP at home.

## 2023-03-24 NOTE — Patient Instructions (Signed)
Loperamide (Imodium) 1 tablet after a loose stool. May take up to 8 tablet in 24 hours.

## 2023-03-24 NOTE — Progress Notes (Signed)
Established Patient Office Visit  Subjective   Patient ID: Brianna Mccarty, female    DOB: 1941-03-11  Age: 81 y.o. MRN: [id number]  Chief Complaint  Patient presents with   Results    Patient states she had a colonoscopy with Dr Russella Dar after recurrent diarrhea, pre-cancerous polyp was found    Pt is here for follow up today.  Chronic diarrhea-- pt states she is still having diarrhea chronically or months, states they took her for a follow up colonoscopy and she did have 2 polyps, but otherwise they couldn't tell her what is causing the diarrhea.   HTN-- BP today is elevated in office. Pt reports she is out of her prazosin 5 mg BID, has been for a month, but she states her BP at home has been in the normal range. She also reduced her hydralazine to twice a day since her BP was good. I rechecked it and her BP is still elevated.we discussed putting her back on the prazosin and she is agreeable.     Current Outpatient Medications  Medication Instructions   acyclovir (ZOVIRAX) 400 mg, Oral, 5 times daily PRN   albuterol (PROAIR HFA) 108 (90 Base) MCG/ACT inhaler 2 puffs, Inhalation, Every 6 hours PRN   albuterol (PROVENTIL) 2.5 mg, Nebulization, Every 6 hours PRN   alendronate (FOSAMAX) 70 MG tablet TAKE 1 TABLET EVERY 7 (SEVEN) DAYS. TAKE WITH A FULL GLASS OF WATER ON AN EMPTY STOMACH.   amLODipine (NORVASC) 10 mg, Oral, Daily   Ascorbic Acid (VITAMIN C PO) 1,000 mg, Oral, Daily   azelastine (ASTELIN) 0.1 % nasal spray Instill 2 sprays into each nostril two times a day as needed for allergies   Bempedoic Acid-Ezetimibe (NEXLIZET) 180-10 MG TABS 1 tablet, Oral, Daily   betamethasone dipropionate 0.05 % cream 1 application , Topical, 2 times daily PRN   budesonide-formoterol (SYMBICORT) 160-4.5 MCG/ACT inhaler 2 puffs, Inhalation, 2 times daily   CALCIUM PO 1,200 mg, Oral, Daily   Cholecalciferol 125 MCG (5000 UT) TABS 1 tablet, Oral, Daily   desmopressin (DDAVP) 0.2 mg, Oral, Daily at  bedtime   EPINEPHrine (EPI-PEN) 0.3 mg, Intramuscular, As needed   ferrous sulfate 325 mg, Oral, 2 times daily with meals   fexofenadine (ALLEGRA) 180 mg, Oral, Daily   furosemide (LASIX) 20 mg, Oral, Daily PRN   hydrALAZINE (APRESOLINE) 50 MG tablet TAKE 1 TAB 3 TIMES A DAY AND TAKE EXTRA TAB IF SBP>160   hydrOXYzine (ATARAX) 10 mg, Oral, Daily at bedtime   losartan (COZAAR) 100 mg, Oral, Daily   MAGNESIUM PO 250 mg, Oral, Daily   METAMUCIL FIBER PO 1 capsule, Oral, Weekly   metoprolol tartrate (LOPRESSOR) 12.5 mg, Oral, Daily, Take half a tablet daily   montelukast (SINGULAIR) 10 mg, Oral, Daily at bedtime   Multiple Vitamin (MULTIVITAMIN PO) 1 tablet, Oral, Daily   omeprazole (PRILOSEC) 40 mg, Oral, Daily   OVER THE COUNTER MEDICATION Juice Plus-daily   OVER THE COUNTER MEDICATION [place] 3 oz once a day   pentosan polysulfate (ELMIRON) 100 mg, Oral, Daily,     prazosin (MINIPRESS) 5 mg, Oral, 2 times daily   Probiotic Product (PROBIOTIC PO) 1 capsule, Oral, Daily    Patient Active Problem List   Diagnosis Date Noted   Acute diverticulitis 07/11/2022   SVT (supraventricular tachycardia) 02/15/2022   Palpitations 01/15/2022   Mild aortic stenosis 01/15/2022   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   Genetic testing 09/13/2020   Family history of pancreatic cancer 09/05/2020   Family history of colon cancer 09/05/2020   Family history of cancer of extrahepatic bile ducts 09/05/2020   Fever 07/26/2020   History of COVID-19 07/26/2020   History of melanoma 05/12/2020   Macular degeneration 02/17/2019   OSA (obstructive sleep apnea) 02/12/2018   Prediabetes 02/12/2018   H/O cold sores 11/27/2015   Murmur 10/20/2015   Carotid artery disease (HCC)    Thyroid cyst    Venous (peripheral) insufficiency 06/02/2009   Asthma 04/27/2008   Melanoma of skin (HCC) 02/10/2008   Allergic rhinitis  02/10/2008   INTERSTITIAL CYSTITIS 02/10/2008   Pure hypercholesterolemia 02/09/2008   Essential hypertension 02/09/2008   GERD 02/09/2008   Osteoporosis 02/09/2008      Review of Systems  All other systems reviewed and are negative.     Objective:     BP (!) 142/60 (BP Location: Right Arm, Patient Position: Sitting, Cuff Size: Large)   Pulse (!) 53   Temp 98.2 F (36.8 C) (Oral)   Ht 5' 0.25" (1.53 m)   Wt 165 lb 1.6 oz (74.9 kg)   SpO2 99%   BMI 31.98 kg/m    Physical Exam Vitals reviewed.  Constitutional:      Appearance: Normal appearance. She is well-groomed and overweight.  Eyes:     Conjunctiva/sclera: Conjunctivae normal.  Cardiovascular:     Rate and Rhythm: Normal rate and regular rhythm.     Pulses: Normal pulses.     Heart sounds: S1 normal and S2 normal.  Pulmonary:     Effort: Pulmonary effort is normal.     Breath sounds: Normal breath sounds and air entry.  Musculoskeletal:     Right lower leg: No edema.     Left lower leg: No edema.  Neurological:     Mental Status: She is alert and oriented to person, place, and time. Mental status is at baseline.     Gait: Gait is intact.  Psychiatric:        Mood and Affect: Mood and affect normal.        Speech: Speech normal.        Behavior: Behavior normal.        Judgment: Judgment normal.      No results found for any visits on 03/24/23.  Last CBC Lab Results  Component Value Date   WBC 5.9 11/11/2022   HGB 12.3 11/11/2022   HCT 37.1 11/11/2022   MCV 88.1 11/11/2022   MCH 28.7 10/21/2021   RDW 15.0 11/11/2022   PLT 196.0 11/11/2022   Last metabolic panel Lab Results  Component Value Date   GLUCOSE 132 (H) 08/07/2022   NA 139 08/07/2022   K 3.8 08/07/2022   CL 105 08/07/2022   CO2 29 08/07/2022   BUN 15 08/07/2022   CREATININE 0.80 08/07/2022   EGFR 69 04/24/2022   CALCIUM 9.2 08/07/2022   PROT 6.1 08/07/2022   ALBUMIN 3.9 08/07/2022   LABGLOB 1.6 04/24/2022   AGRATIO 2.6 (H)  04/24/2022   BILITOT 0.3 08/07/2022   ALKPHOS 31 (L) 08/07/2022   AST 12 08/07/2022   ALT 7 08/07/2022   ANIONGAP 8 10/21/2021      The ASCVD Risk score (Arnett DK, et al., 2019) failed to calculate for the following reasons:   The 2019 ASCVD risk score is only valid for ages 16 to 44    Assessment & Plan:  Prediabetes  Assessment & Plan: Checking new A1C today with labs, currently not on medication, diet controlled only.   Orders: -     Hemoglobin A1c  Iron deficiency -   Taking ferrous sulfate 325 mg BID, will check new iron panel    Iron, TIBC and Ferritin Panel  Essential hypertension Assessment & Plan: Current hypertension medications:       Sig   furosemide (LASIX) 20 MG tablet (Taking) TAKE 1 TABLET (20 MG TOTAL) BY MOUTH DAILY AS NEEDED FOR EDEMA.   hydrALAZINE (APRESOLINE) 50 MG tablet (Taking) TAKE 1 TAB 3 TIMES A DAY AND TAKE EXTRA TAB IF SBP>160   losartan (COZAAR) 100 MG tablet (Taking) TAKE 1 TABLET BY MOUTH EVERY DAY   metoprolol tartrate (LOPRESSOR) 25 MG tablet (Taking) Take 12.5 mg by mouth daily. Take half a tablet daily   amLODipine (NORVASC) 10 MG tablet Take 1 tablet (10 mg total) by mouth daily.   prazosin (MINIPRESS) 5 MG capsule Take 1 capsule (5 mg total) by mouth 2 (two) times daily.      BP is elevated today in office. I rechecked it and got 160/90. Pt is missing her prazosin 5 mg BID. I have refilled it for her today. She will continue to check her BP at home.   Orders: -     amLODIPine Besylate; Take 1 tablet (10 mg total) by mouth daily.  Dispense: 90 tablet; Refill: 1 -     Prazosin HCl; Take 1 capsule (5 mg total) by mouth 2 (two) times daily.  Dispense: 180 capsule; Refill: 1  Vitamin D deficiency -     history of in the past, has not had her level checked in sometime when I reviewed her labs, will check new level today  VITAMIN D 25 Hydroxy (Vit-D Deficiency, Fractures)     Return in about 6 months (around 09/24/2023) for HTN.     [name], MD

## 2023-03-24 NOTE — Assessment & Plan Note (Signed)
Checking new A1C today with labs, currently not on medication, diet controlled only.

## 2023-03-25 LAB — VITAMIN D 25 HYDROXY (VIT D DEFICIENCY, FRACTURES): VITD: 60.29 ng/mL (ref 30.00–100.00)

## 2023-03-25 LAB — IRON,TIBC AND FERRITIN PANEL
%SAT: 23 % (calc) (ref 16–45)
Ferritin: 36 ng/mL (ref 16–288)
Iron: 95 ug/dL (ref 45–160)
TIBC: 405 mcg/dL (calc) (ref 250–450)

## 2023-03-25 NOTE — Telephone Encounter (Signed)
Referral entered as below.

## 2023-03-27 ENCOUNTER — Other Ambulatory Visit: Payer: Self-pay | Admitting: Gastroenterology

## 2023-03-27 DIAGNOSIS — J3089 Other allergic rhinitis: Secondary | ICD-10-CM | POA: Diagnosis not present

## 2023-03-27 DIAGNOSIS — J3081 Allergic rhinitis due to animal (cat) (dog) hair and dander: Secondary | ICD-10-CM | POA: Diagnosis not present

## 2023-03-27 DIAGNOSIS — J301 Allergic rhinitis due to pollen: Secondary | ICD-10-CM | POA: Diagnosis not present

## 2023-04-04 DIAGNOSIS — J3081 Allergic rhinitis due to animal (cat) (dog) hair and dander: Secondary | ICD-10-CM | POA: Diagnosis not present

## 2023-04-04 DIAGNOSIS — J3089 Other allergic rhinitis: Secondary | ICD-10-CM | POA: Diagnosis not present

## 2023-04-04 DIAGNOSIS — J301 Allergic rhinitis due to pollen: Secondary | ICD-10-CM | POA: Diagnosis not present

## 2023-04-08 DIAGNOSIS — M48061 Spinal stenosis, lumbar region without neurogenic claudication: Secondary | ICD-10-CM | POA: Diagnosis not present

## 2023-04-10 ENCOUNTER — Ambulatory Visit (HOSPITAL_COMMUNITY)
Admission: RE | Admit: 2023-04-10 | Discharge: 2023-04-10 | Disposition: A | Discharge status: Home / Self Care | Payer: HMO | Source: Ambulatory Visit | Attending: General Surgery | Admitting: General Surgery

## 2023-04-10 ENCOUNTER — Other Ambulatory Visit (HOSPITAL_COMMUNITY): Payer: Self-pay | Admitting: General Surgery

## 2023-04-10 DIAGNOSIS — R935 Abnormal findings on diagnostic imaging of other abdominal regions, including retroperitoneum: Secondary | ICD-10-CM | POA: Diagnosis not present

## 2023-04-10 DIAGNOSIS — J3089 Other allergic rhinitis: Secondary | ICD-10-CM | POA: Diagnosis not present

## 2023-04-10 DIAGNOSIS — K862 Cyst of pancreas: Secondary | ICD-10-CM | POA: Insufficient documentation

## 2023-04-10 DIAGNOSIS — J301 Allergic rhinitis due to pollen: Secondary | ICD-10-CM | POA: Diagnosis not present

## 2023-04-10 DIAGNOSIS — N281 Cyst of kidney, acquired: Secondary | ICD-10-CM | POA: Diagnosis not present

## 2023-04-10 DIAGNOSIS — K8689 Other specified diseases of pancreas: Secondary | ICD-10-CM | POA: Diagnosis not present

## 2023-04-10 MED ORDER — GADOBUTROL 1 MMOL/ML IV SOLN
7.5000 mL | Freq: Once | INTRAVENOUS | Status: AC | PRN
  Administered 2023-04-10: 7.5 mL via INTRAVENOUS

## 2023-04-14 NOTE — Progress Notes (Unsigned)
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 mild by carotid duplex.   Cervical dysplasia 1971   Coronary artery disease    Diverticulosis of colon    DJD (degenerative joint disease)    Family history of cancer of extrahepatic bile ducts 09/05/2020   Family history of colon cancer 09/05/2020   Family history of pancreatic cancer 09/05/2020   GERD (gastroesophageal reflux disease)    Headache(784.0)    History of kidney stones    Hypercholesterolemia    Hypertension    Interstitial cystitis    sees urologist   Lichen sclerosus    Malignant melanoma (HCC) 2006   sees Dr. Margo Mccarty in dermatology   Migraines    Mild aortic stenosis    Mitral valve disease    Question mitral valve prolapse in the past, no prolapse by echo 2009   Murmur 10/20/2015   SEES DR NELSON   OSA (obstructive sleep apnea)    USES DENTAL DEVICE   Osteoporosis    on fosomax > 5 years, stopped 11/2015   Pneumonia    SVT (supraventricular tachycardia) 02/15/2022   Monitor 02/2022: NSR, avg HR 61; 2 runs of NSVT (4 beats); several runs of Supraventricular Tachycardia (longest 3"11'); no AFib   Thyroid cyst    1 x 1.1 thyroid cyst noted on carotid Doppler, January, 2012   Venous insufficiency    Past Surgical History:  Procedure Laterality Date   BREAST BIOPSY Left 11/25/2011   U/S core, benign performed at [facility], LEFT BREAT MARKER IN   BREAST CYST ASPIRATION     BREAST SURGERY  2013   Breast Bx-Benign   CARDIAC CATHETERIZATION     CATARACT  EXTRACTION, BILATERAL     CHOLECYSTECTOMY N/A 12/22/2019   Procedure: LAPAROSCOPIC CHOLECYSTECTOMY;  Surgeon: Brianna Meuse, MD;  Location: [facility];  Service: General;  Laterality: N/A;   cystoscopy and basket stone removal right ureter  02/2006   Dr. Dolphus Mccarty SURGERY  12/22/2019   GYNECOLOGIC CRYOSURGERY  1971   left total hip replacement  2004   Dr. Darrelyn Mccarty   melanoma removed from medial rleft knee area  2006   Dr. Margo Mccarty   NASAL SEPTUM SURGERY     TOTAL HIP ARTHROPLASTY Right 08/01/2021   Procedure: TOTAL HIP ARTHROPLASTY ANTERIOR APPROACH;  Surgeon: Brianna Gross, MD;  Location: [facility];  Service: Orthopedics;  Laterality: Right;    Allergies  Allergies  Allergen Reactions   Celebrex [Celecoxib] Diarrhea   Cephalexin     Reaction was a high fever   Hydrochlorothiazide     hyponatremia   Penicillins Hives and Swelling    Swelling of arms & face Has patient had a PCN reaction causing immediate rash, facial/tongue/throat swelling, SOB or lightheadedness with hypotension: Yes Has patient had a PCN reaction causing severe rash involving mucus membranes or skin necrosis: No Has patient had a PCN reaction that required hospitalization: No Has patient had  a PCN reaction occurring within the last 10 years: No If all of the above answers are "NO", then may proceed with Cephalosporin use.    Phenobarbital Hives   Statins     Muscle pain   Passion Fruit Flavor Diarrhea and Rash   Tape Rash    MEDICAL TAPE     History of Present Illness    Brianna Mccarty is a 82 y.o. female with PMH of HTN, HLD ascending aorta dilation, OSA, SVT, mild carotid artery disease,  and mild AS/MR who presents today for 20-month follow-up.  She was previously followed by Dr. Delton Mccarty and is now followed by Dr. Shari Mccarty. She had nuclear stress test on 2018 that was normal following complaint of significant fatigue. 2D echo was completed 06/2018 that showed EF of 55-60%  with grade 1 DD, mild aortic stenosis with AVA 1.5cm2, mean gradient 14mm Hg and severely dilated LA.   [name] presents today for 1 year follow-up alone.  Since last being seen in the office patient reports that she has been doing well with no new cardiac complaints since her previous visit.  Her blood pressure today is well-controlled at 97/68 and heart rate is 61 bpm.  She is compliant with her medications and denies any adverse reactions.  She continues to stay active with walking on treadmill at home.  She is also conscious of her diet and rarely eats red meat..  Patient denies chest pain, palpitations, dyspnea, PND, orthopnea, nausea, vomiting, dizziness, syncope, edema, weight gain, or early satiety.  Home Medications    Current Outpatient Medications  Medication Sig Dispense Refill   acyclovir (ZOVIRAX) 400 MG tablet Take 1 tablet (400 mg total) by mouth 5 (five) times daily as needed (fever blisters). 30 tablet 2   albuterol (PROAIR HFA) 108 (90 Base) MCG/ACT inhaler Inhale 2 puffs into the lungs every 6 (six) hours as needed for wheezing. 3 Inhaler 3   albuterol (PROVENTIL) (2.5 MG/3ML) 0.083% nebulizer solution Take 3 mLs (2.5 mg total) by nebulization every 6 (six) hours as needed. 360 mL 3   alendronate (FOSAMAX) 70 MG tablet TAKE 1 TABLET EVERY 7 (SEVEN) DAYS. TAKE WITH A FULL GLASS OF WATER ON AN EMPTY STOMACH. 12 tablet 3   amLODipine (NORVASC) 10 MG tablet Take 1 tablet (10 mg total) by mouth daily. 90 tablet 1   Ascorbic Acid (VITAMIN C PO) Take 1,000 mg by mouth daily.     azelastine (ASTELIN) 0.1 % nasal spray Instill 2 sprays into each nostril two times a day as needed for allergies 30 mL 0   Bempedoic Acid-Ezetimibe (NEXLIZET) 180-10 MG TABS Take 1 tablet by mouth daily. 90 tablet 3   betamethasone dipropionate 0.05 % cream Apply 1 application topically 2 (two) times daily as needed (scaly skin).     budesonide-formoterol (SYMBICORT) 160-4.5 MCG/ACT inhaler Inhale 2 puffs  into the lungs 2 (two) times daily. 3 each 3   CALCIUM PO Take 1,200 mg by mouth daily.     Cholecalciferol 125 MCG (5000 UT) TABS Take 1 tablet by mouth daily.     desmopressin (DDAVP) 0.2 MG tablet Take 0.2 mg by mouth at bedtime.     EPINEPHrine 0.3 mg/0.3 mL IJ SOAJ injection Inject 0.3 mg into the muscle as needed for anaphylaxis.     ferrous sulfate 325 (65 FE) MG tablet TAKE 1 TABLET BY MOUTH TWICE A DAY WITH FOOD 180 tablet 0   fexofenadine (ALLEGRA) 180 MG tablet Take 180 mg  by mouth daily.     furosemide (LASIX) 20 MG tablet TAKE 1 TABLET (20 MG TOTAL) BY MOUTH DAILY AS NEEDED FOR EDEMA. 90 tablet 0   hydrALAZINE (APRESOLINE) 50 MG tablet TAKE 1 TAB 3 TIMES A DAY AND TAKE EXTRA TAB IF SBP>160 315 tablet 3   hydrOXYzine (ATARAX/VISTARIL) 10 MG tablet Take 10 mg by mouth at bedtime.      losartan (COZAAR) 100 MG tablet TAKE 1 TABLET BY MOUTH EVERY DAY 90 tablet 0   MAGNESIUM PO Take 250 mg by mouth daily.     METAMUCIL FIBER PO Take 1 capsule by mouth once a week.     metoprolol tartrate (LOPRESSOR) 25 MG tablet Take 12.5 mg by mouth daily. Take half a tablet daily     montelukast (SINGULAIR) 10 MG tablet Take 10 mg by mouth at bedtime.     Multiple Vitamin (MULTIVITAMIN PO) Take 1 tablet by mouth daily.     omeprazole (PRILOSEC) 40 MG capsule TAKE 1 CAPSULE (40 MG TOTAL) BY MOUTH DAILY. 90 capsule 0   OVER THE COUNTER MEDICATION Juice Plus-daily     OVER THE COUNTER MEDICATION Nopolea 3 oz once a day     pentosan polysulfate (ELMIRON) 100 MG capsule Take 100 mg by mouth daily.     prazosin (MINIPRESS) 5 MG capsule Take 1 capsule (5 mg total) by mouth 2 (two) times daily. 180 capsule 1   Probiotic Product (PROBIOTIC PO) Take 1 capsule by mouth daily.      No current facility-administered medications for this visit.     Review of Systems  Please Mccarty the history of present illness.    (+) Chronic back pain and knee pain   All other systems reviewed and are otherwise negative  except as noted above.  Physical Exam    Wt Readings from Last 3 Encounters:  03/24/23 165 lb 1.6 oz (74.9 kg)  03/04/23 164 lb (74.4 kg)  12/18/22 164 lb (74.4 kg)   XB:JYNWG were no vitals filed for this visit.,There is no height or weight on file to calculate BMI.  Constitutional:      Appearance: Healthy appearance. Not in distress.  Neck:     Vascular: JVD normal.  Pulmonary:     Effort: Pulmonary effort is normal.     Breath sounds: No wheezing. No rales. Diminished in the bases Cardiovascular:     Normal rate. Regular rhythm. Normal S1. Normal S2.      Murmurs: Soft systolic murmur    Peripheral edema absent.  Abdominal:     Palpations: Abdomen is soft non tender. There is no hepatomegaly.  Skin:    General: Skin is warm and dry.  Neurological:     General: No focal deficit present.     Mental Status: Alert and oriented to person, place and time.     Cranial Nerves: Cranial nerves are intact.  EKG/LABS/ Recent Cardiac Studies    ECG personally reviewed by me today -sinus rhythm with left axis deviation rate of 61 bpm with TWI in lead III no acute changes consistent with previous EKG.  Cardiac Studies & Procedures     STRESS TESTS  MYOCARDIAL PERFUSION IMAGING 06/06/2021  Narrative  The left ventricular ejection fraction is hyperdynamic (>65%).  Nuclear stress EF: 74%.  There was no ST segment deviation noted during stress.  The study is normal.  This is a low risk study.  1. Fixed apical inferior perfusion defect with normal wall motion, consistent with  artifact 2. Low risk study   ECHOCARDIOGRAM  ECHOCARDIOGRAM COMPLETE 08/22/2022  Narrative ECHOCARDIOGRAM REPORT    Patient Name:   Brianna Mccarty Date of Exam: 08/22/2022 Medical Rec #:  [id number]       Height:       62.0 in Accession #:    [id number]      Weight:       165.0 lb Date of Birth:  20-Oct-1941       BSA:          1.762 m Patient Age:    81 years        BP:           114/60  mmHg Patient Gender: F               HR:           55 bpm. Exam Location:  [address]  Procedure: 2D Echo, 3D Echo, Cardiac Doppler, Color Doppler and Strain Analysis  Indications:    I77.81 Ascending aortic dilation  History:        Patient has prior history of Echocardiogram examinations, most recent 04/23/2021. CAD, Aortic Valve Disease, Arrythmias:SVT. Palpitation, Signs/Symptoms:Murmur; Risk Factors:Hypertension, Sleep Apnea and Dyslipidemia. COVID-19. Venous insufficiency. Ascending aortic dilation. Pre-diabetes.  Sonographer:    Jorje Guild BS, RDCS Referring Phys: [id number] [facility]ER E [place]  IMPRESSIONS   1. Left ventricular ejection fraction by 3D volume is 65 %. The left ventricle has normal function. The left ventricle has no regional wall motion abnormalities. The average left ventricular global longitudinal strain is -24.7 %. The global longitudinal strain is normal. 2. Right ventricular systolic function is normal. The right ventricular size is normal. There is normal pulmonary artery systolic pressure. 3. Left atrial size was severely dilated. 4. The mitral valve is degenerative. Mild to moderate mitral valve regurgitation. No evidence of mitral stenosis. Moderate mitral annular calcification. 5. Tricuspid valve regurgitation is moderate. 6. The aortic valve is calcified. Aortic valve regurgitation is not visualized. Mild aortic valve stenosis. Aortic valve area, by VTI measures 1.71 cm. Aortic valve mean gradient measures 18.4 mmHg. Aortic valve Vmax measures 2.82 m/s. 7. There is mild dilatation of the ascending aorta, measuring 39 mm. 8. The inferior vena cava is normal in size with greater than 50% respiratory variability, suggesting right atrial pressure of 3 mmHg.  Comparison(s): 04/23/21 EF 60-65%. Mild AS mean PG, peak PG. Ascending aorta 40mm.  FINDINGS Left Ventricle: Left ventricular ejection fraction by 3D volume is 65 %. The left ventricle  has normal function. The left ventricle has no regional wall motion abnormalities. The average left ventricular global longitudinal strain is -24.7 %. The global longitudinal strain is normal. The left ventricular internal cavity size was normal in size. There is no left ventricular hypertrophy. Left ventricular diastolic function could not be evaluated due to mitral annular calcification (moderate or greater).  Right Ventricle: The right ventricular size is normal. No increase in right ventricular wall thickness. Right ventricular systolic function is normal. There is normal pulmonary artery systolic pressure. The tricuspid regurgitant velocity is 2.57 m/s, and with an assumed right atrial pressure of 3 mmHg, the estimated right ventricular systolic pressure is 29.4 mmHg.  Left Atrium: Left atrial size was severely dilated.  Right Atrium: Right atrial size was normal in size.  Pericardium: There is no evidence of pericardial effusion.  Mitral Valve: The mitral valve is degenerative in appearance. Moderate mitral annular calcification. Mild to moderate mitral valve regurgitation.  No evidence of mitral valve stenosis.  Tricuspid Valve: The tricuspid valve is normal in structure. Tricuspid valve regurgitation is moderate . No evidence of tricuspid stenosis.  Aortic Valve: The aortic valve is calcified. Aortic valve regurgitation is not visualized. Mild aortic stenosis is present. Aortic valve mean gradient measures 18.4 mmHg. Aortic valve peak gradient measures 31.8 mmHg. Aortic valve area, by VTI measures 1.71 cm.  Pulmonic Valve: The pulmonic valve was not well visualized. Pulmonic valve regurgitation is trivial. No evidence of pulmonic stenosis.  Aorta: There is mild dilatation of the ascending aorta, measuring 39 mm.  Venous: The inferior vena cava is normal in size with greater than 50% respiratory variability, suggesting right atrial pressure of 3 mmHg.  IAS/Shunts: No atrial level  shunt detected by color flow Doppler.   LEFT VENTRICLE PLAX 2D LVIDd:         5.10 cm         Diastology LVIDs:         3.10 cm         LV e' medial:    6.23 cm/s LV PW:         0.90 cm         LV E/e' medial:  14.0 LV IVS:        0.90 cm         LV e' lateral:   7.13 cm/s LVOT diam:     2.30 cm         LV E/e' lateral: 12.2 LV SV:         126 LV SV Index:   71              2D LVOT Area:     4.15 cm        Longitudinal Strain 2D Strain GLS  -25.9 % (A2C): 2D Strain GLS  -22.3 % (A3C): 2D Strain GLS  -26.1 % (A4C): 2D Strain GLS  -24.7 % Avg:  3D Volume EF LV 3D EF:    Left ventricul ar ejection fraction by 3D volume is 65 %.  3D Volume EF: 3D EF:        65 % LV EDV:       105 ml LV ESV:       37 ml LV SV:        68 ml  RIGHT VENTRICLE             IVC RV Basal diam:  3.30 cm     IVC diam: 1.50 cm RV S prime:     12.90 cm/s TAPSE (M-mode): 2.4 cm RVSP:           29.4 mmHg  LEFT ATRIUM              Index        RIGHT ATRIUM           Index LA diam:        5.90 cm  3.35 cm/m   RA Pressure: 3.00 mmHg LA Vol (A2C):   96.8 ml  54.95 ml/m  RA Area:     18.20 cm LA Vol (A4C):   114.0 ml 64.71 ml/m  RA Volume:   45.50 ml  25.83 ml/m LA Biplane Vol: 107.0 ml 60.74 ml/m AORTIC VALVE AV Area (Vmax):    1.69 cm AV Area (Vmean):   1.63 cm AV Area (VTI):     1.71 cm AV Vmax:           282.00  cm/s AV Vmean:          199.800 cm/s AV VTI:            0.738 m AV Peak Grad:      31.8 mmHg AV Mean Grad:      18.4 mmHg LVOT Vmax:         115.00 cm/s LVOT Vmean:        78.600 cm/s LVOT VTI:          0.303 m LVOT/AV VTI ratio: 0.41  AORTA Ao Root diam: 3.10 cm Ao Asc diam:  3.90 cm  MITRAL VALVE                  TRICUSPID VALVE TR Peak grad:   26.4 mmHg MV Decel Time: 292 msec       TR Vmax:        257.00 cm/s MR Peak grad:    126.3 mmHg   Estimated RAP:  3.00 mmHg MR Mean grad:    79.0 mmHg    RVSP:           29.4 mmHg MR Vmax:         562.00 cm/s MR Vmean:         419.0 cm/s   SHUNTS MR PISA:         1.57 cm     Systemic VTI:  0.30 m MR PISA Eff ROA: 9 mm        Systemic Diam: 2.30 cm MR PISA Radius:  0.50 cm MV E velocity: 87.00 cm/s MV A velocity: 124.00 cm/s MV E/A ratio:  0.70  Kardie Tobb DO Electronically signed by Thomasene Ripple DO Signature Date/Time: 08/22/2022/2:58:08 PM    Final    MONITORS  LONG TERM MONITOR (3-14 DAYS) 02/15/2022  Narrative  Patch wear time was 14 days  Predominant rhythm was NSR with average HR 61bpm (ranging from 40-218bpm)  There were 2 runs of nonsustained VT with longest lasting 4 beats  There were 1674 runs of SVT with longest lasting and 11 sec  There were occasional SVE including couplets and triplets  Rare VE (<1%)  No Afib or significant pauses   Patch Wear Time:  14 days and 0 hours (2023-03-15T09:54:44-0400 to 2023-03-29T09:54:48-0400)  Patient had a min HR of 40 bpm, max HR of 218 bpm, and avg HR of 61 bpm. Predominant underlying rhythm was Sinus Rhythm. 2 Ventricular Tachycardia runs occurred, the run with the fastest interval lasting 4 beats with a max rate of 158 bpm, the longest lasting 4 beats with an avg rate of 113 bpm. 1674 Supraventricular Tachycardia runs occurred, the run with the fastest interval lasting 8 beats with a max rate of 218 bpm, the longest lasting 3 mins 11 secs with an avg rate of 104 bpm. Isolated SVEs were occasional (2.3%, 28550), SVE Couplets were occasional (1.0%, 6221), and SVE Triplets were occasional (1.1%, 4286). Isolated VEs were rare (<1.0%, 3110), VE Couplets were rare (<1.0%, 155), and VE Triplets were rare (<1.0%, 23). Ventricular Bigeminy and Trigeminy were present.  Laurance Flatten, MD            Lab Results  Component Value Date   WBC 5.9 11/11/2022   HGB 12.3 11/11/2022   HCT 37.1 11/11/2022   MCV 88.1 11/11/2022   PLT 196.0 11/11/2022   Lab Results  Component Value Date   CREATININE 0.80 08/07/2022   BUN 15 08/07/2022    NA 139 08/07/2022   K 3.8  08/07/2022   CL 105 08/07/2022   CO2 29 08/07/2022   Lab Results  Component Value Date   ALT 7 08/07/2022   AST 12 08/07/2022   ALKPHOS 31 (L) 08/07/2022   BILITOT 0.3 08/07/2022   Lab Results  Component Value Date   CHOL 192 07/26/2022   HDL 76 07/26/2022   LDLCALC 109 (H) 07/26/2022   LDLDIRECT 126.6 07/18/2010   TRIG 35 07/26/2022   CHOLHDL 2.5 07/26/2022    Lab Results  Component Value Date   HGBA1C 5.8 03/24/2023     Assessment & Plan    1.  Palpitations: -Patient reports no recurrence of palpitations since her previous visit last year. -Continue Toprol-XL 12.5 mg daily and patient instructed to take additional 12.5 if breakthrough palpitations occur.   2.  Essential hypertension: -Blood pressure today was 97/60 -Continue amlodipine 10 mg daily, hydralazine will be changed to twice daily, losartan 20 mg daily, and Minipress 5 mg daily    3.  Pure hypercholesterolemia -Patient's last LDL was completed was improved 109 but still not at goal.  Following initiation of bempedoic acid -We discussed lifestyle strategies with increasing physical activity and adding foods rich in omega-3 in her diet. -Continue bempedoic acid 180 mg daily   4.  Mild aortic stenosis: -Patient currently denies angina, syncope, or presyncope -2D echo completed on 6/22 with EF of 60-65%, no RWMA, mild concentric LVH, grade 2 DDAS (mean 10.5 mmHg -Plan to have repeat 2D echo in October   5. Dilation of Aorta: -Patient completed cardiac CTA on 04/2022 with no significant dilation of the aorta.   -Per Dr. Cleotis Lema no further surveillance CTs necessary  Disposition: Follow-up with Brianna Sprague, MD or APP in 12 months    Medication Adjustments/Labs and Tests Ordered: Current medicines are reviewed at length with the patient today.  Concerns regarding medicines are outlined above.   Signed, Napoleon Form, Leodis Rains, NP 04/14/2023, 7:35 PM [facility]

## 2023-04-15 ENCOUNTER — Ambulatory Visit: Payer: HMO | Attending: Nurse Practitioner | Admitting: Nurse Practitioner

## 2023-04-15 ENCOUNTER — Encounter: Payer: Self-pay | Admitting: Nurse Practitioner

## 2023-04-15 VITALS — BP 97/60 | HR 61 | Ht 60.3 in | Wt 166.6 lb

## 2023-04-15 DIAGNOSIS — I350 Nonrheumatic aortic (valve) stenosis: Secondary | ICD-10-CM

## 2023-04-15 DIAGNOSIS — R002 Palpitations: Secondary | ICD-10-CM

## 2023-04-15 DIAGNOSIS — I10 Essential (primary) hypertension: Secondary | ICD-10-CM

## 2023-04-15 DIAGNOSIS — I77819 Aortic ectasia, unspecified site: Secondary | ICD-10-CM

## 2023-04-15 DIAGNOSIS — E7800 Pure hypercholesterolemia, unspecified: Secondary | ICD-10-CM | POA: Diagnosis not present

## 2023-04-15 MED ORDER — HYDRALAZINE HCL 50 MG PO TABS: 50.0000 mg | ORAL_TABLET | Freq: Three times a day (TID) | ORAL | 2 refills | Status: DC

## 2023-04-15 NOTE — Patient Instructions (Addendum)
Medication Instructions:  CHANGE Hydralazine to three times a day  *If you need a refill on your cardiac medications before your next appointment, please call your pharmacy*   Lab Work: None ordered If you have labs (blood work) drawn today and your tests are completely normal, you will receive your results only by: MyChart Message (if you have MyChart) OR A paper copy in the mail If you have any lab test that is abnormal or we need to change your treatment, we will call you to review the results.   Testing/Procedures: None ordered  Follow-Up: At [facility], you and your health needs are our priority.  As part of our continuing mission to provide you with exceptional heart care, we have created designated Provider Care Teams.  These Care Teams include your primary Cardiologist (physician) and Advanced Practice Providers (APPs -  Physician Assistants and Nurse Practitioners) who all work together to provide you with the care you need, when you need it.  We recommend signing up for the patient portal called "MyChart".  Sign up information is provided on this After Visit Summary.  MyChart is used to connect with patients for Virtual Visits (Telemedicine).  Patients are able to view lab/test results, encounter notes, upcoming appointments, etc.  Non-urgent messages can be sent to your provider as well.   To learn more about what you can do with MyChart, go to [email or web address].    Your next appointment:   12 month(s)  Provider:   Dr Verne Carrow or [facility] Provider ([address], [place], [place], [place])   Other Instructions

## 2023-04-16 DIAGNOSIS — J301 Allergic rhinitis due to pollen: Secondary | ICD-10-CM | POA: Diagnosis not present

## 2023-04-16 DIAGNOSIS — J3089 Other allergic rhinitis: Secondary | ICD-10-CM | POA: Diagnosis not present

## 2023-04-16 DIAGNOSIS — J3081 Allergic rhinitis due to animal (cat) (dog) hair and dander: Secondary | ICD-10-CM | POA: Diagnosis not present

## 2023-04-17 DIAGNOSIS — Z1231 Encounter for screening mammogram for malignant neoplasm of breast: Secondary | ICD-10-CM | POA: Diagnosis not present

## 2023-04-21 ENCOUNTER — Encounter: Payer: Self-pay | Admitting: Nurse Practitioner

## 2023-04-23 ENCOUNTER — Ambulatory Visit (INDEPENDENT_AMBULATORY_CARE_PROVIDER_SITE_OTHER): Payer: HMO

## 2023-04-23 VITALS — Ht 60.3 in | Wt 165.0 lb

## 2023-04-23 DIAGNOSIS — J3089 Other allergic rhinitis: Secondary | ICD-10-CM | POA: Diagnosis not present

## 2023-04-23 DIAGNOSIS — Z0000 Encounter for general adult medical examination without abnormal findings: Secondary | ICD-10-CM

## 2023-04-23 DIAGNOSIS — J301 Allergic rhinitis due to pollen: Secondary | ICD-10-CM | POA: Diagnosis not present

## 2023-04-23 DIAGNOSIS — J3081 Allergic rhinitis due to animal (cat) (dog) hair and dander: Secondary | ICD-10-CM | POA: Diagnosis not present

## 2023-04-23 NOTE — Patient Instructions (Addendum)
Brianna Mccarty , Thank you for taking time to come for your Medicare Wellness Visit. I appreciate your ongoing commitment to your health goals. Please review the following plan we discussed and let me know if I can assist you in the future.   These are the goals we discussed:  Goals       Lose weight (pt-stated)      Patient Stated      Increase exercise routine, and eat healthier by eating more vegetables, especially once GI symptoms improve.       Patient Stated      Lose weight       Patient Stated      04/15/2022, wants to lose weight and exercise more and eat healthy        This is a list of the screening recommended for you and due dates:  Health Maintenance  Topic Date Due   COVID-19 Vaccine (5 - 2023-24 season) 05/09/2023*   Flu Shot  06/05/2023   Mammogram  04/16/2024   Medicare Annual Wellness Visit  04/22/2024   DTaP/Tdap/Td vaccine (3 - Td or Tdap) 10/23/2025   Pneumonia Vaccine  Completed   DEXA scan (bone density measurement)  Completed   Zoster (Shingles) Vaccine  Completed   HPV Vaccine  Aged Out   Colon Cancer Screening  Discontinued  *Topic was postponed. The date shown is not the original due date.    Advanced directives: In chart  Conditions/risks identified: None  Next appointment: Follow up in one year for your annual wellness visit    Preventive Care 65 Years and Older, Female Preventive care refers to lifestyle choices and visits with your health care provider that can promote health and wellness. What does preventive care include? A yearly physical exam. This is also called an annual well check. Dental exams once or twice a year. Routine eye exams. Ask your health care provider how often you should have your eyes checked. Personal lifestyle choices, including: Daily care of your teeth and gums. Regular physical activity. Eating a healthy diet. Avoiding tobacco and drug use. Limiting alcohol use. Practicing safe sex. Taking low-dose aspirin  every day. Taking vitamin and mineral supplements as recommended by your health care provider. What happens during an annual well check? The services and screenings done by your health care provider during your annual well check will depend on your age, overall health, lifestyle risk factors, and family history of disease. Counseling  Your health care provider may ask you questions about your: Alcohol use. Tobacco use. Drug use. Emotional well-being. Home and relationship well-being. Sexual activity. Eating habits. History of falls. Memory and ability to understand (cognition). Work and work Astronomer. Reproductive health. Screening  You may have the following tests or measurements: Height, weight, and BMI. Blood pressure. Lipid and cholesterol levels. These may be checked every 5 years, or more frequently if you are over 40 years old. Skin check. Lung cancer screening. You may have this screening every year starting at age 22 if you have a 30-pack-year history of smoking and currently smoke or have quit within the past 15 years. Fecal occult blood test (FOBT) of the stool. You may have this test every year starting at age 38. Flexible sigmoidoscopy or colonoscopy. You may have a sigmoidoscopy every 5 years or a colonoscopy every 10 years starting at age 30. Hepatitis C blood test. Hepatitis B blood test. Sexually transmitted disease (STD) testing. Diabetes screening. This is done by checking your blood sugar (glucose) after  you have not eaten for a while (fasting). You may have this done every 1-3 years. Bone density scan. This is done to screen for osteoporosis. You may have this done starting at age 36. Mammogram. This may be done every 1-2 years. Talk to your health care provider about how often you should have regular mammograms. Talk with your health care provider about your test results, treatment options, and if necessary, the need for more tests. Vaccines  Your health  care provider may recommend certain vaccines, such as: Influenza vaccine. This is recommended every year. Tetanus, diphtheria, and acellular pertussis (Tdap, Td) vaccine. You may need a Td booster every 10 years. Zoster vaccine. You may need this after age 6. Pneumococcal 13-valent conjugate (PCV13) vaccine. One dose is recommended after age 33. Pneumococcal polysaccharide (PPSV23) vaccine. One dose is recommended after age 48. Talk to your health care provider about which screenings and vaccines you need and how often you need them. This information is not intended to replace advice given to you by your health care provider. Make sure you discuss any questions you have with your health care provider. Document Released: 11/17/2015 Document Revised: 07/10/2016 Document Reviewed: 08/22/2015 Elsevier Interactive Patient Education  2017 [employer].  Fall Prevention in the Home Falls can cause injuries. They can happen to people of all ages. There are many things you can do to make your home safe and to help prevent falls. What can I do on the outside of my home? Regularly fix the edges of walkways and driveways and fix any cracks. Remove anything that might make you trip as you walk through a door, such as a raised step or threshold. Trim any bushes or trees on the path to your home. Use bright outdoor lighting. Clear any walking paths of anything that might make someone trip, such as rocks or tools. Regularly check to see if handrails are loose or broken. Make sure that both sides of any steps have handrails. Any raised decks and porches should have guardrails on the edges. Have any leaves, snow, or ice cleared regularly. Use sand or salt on walking paths during winter. Clean up any spills in your garage right away. This includes oil or grease spills. What can I do in the bathroom? Use night lights. Install grab bars by the toilet and in the tub and shower. Do not use towel bars as grab  bars. Use non-skid mats or decals in the tub or shower. If you need to sit down in the shower, use a plastic, non-slip stool. Keep the floor dry. Clean up any water that spills on the floor as soon as it happens. Remove soap buildup in the tub or shower regularly. Attach bath mats securely with double-sided non-slip rug tape. Do not have throw rugs and other things on the floor that can make you trip. What can I do in the bedroom? Use night lights. Make sure that you have a light by your bed that is easy to reach. Do not use any sheets or blankets that are too big for your bed. They should not hang down onto the floor. Have a firm chair that has side arms. You can use this for support while you get dressed. Do not have throw rugs and other things on the floor that can make you trip. What can I do in the kitchen? Clean up any spills right away. Avoid walking on wet floors. Keep items that you use a lot in easy-to-reach places. If you  need to reach something above you, use a strong step stool that has a grab bar. Keep electrical cords out of the way. Do not use floor polish or wax that makes floors slippery. If you must use wax, use non-skid floor wax. Do not have throw rugs and other things on the floor that can make you trip. What can I do with my stairs? Do not leave any items on the stairs. Make sure that there are handrails on both sides of the stairs and use them. Fix handrails that are broken or loose. Make sure that handrails are as long as the stairways. Check any carpeting to make sure that it is firmly attached to the stairs. Fix any carpet that is loose or worn. Avoid having throw rugs at the top or bottom of the stairs. If you do have throw rugs, attach them to the floor with carpet tape. Make sure that you have a light switch at the top of the stairs and the bottom of the stairs. If you do not have them, ask someone to add them for you. What else can I do to help prevent  falls? Wear shoes that: Do not have high heels. Have rubber bottoms. Are comfortable and fit you well. Are closed at the toe. Do not wear sandals. If you use a stepladder: Make sure that it is fully opened. Do not climb a closed stepladder. Make sure that both sides of the stepladder are locked into place. Ask someone to hold it for you, if possible. Clearly mark and make sure that you can see: Any grab bars or handrails. First and last steps. Where the edge of each step is. Use tools that help you move around (mobility aids) if they are needed. These include: Canes. Walkers. Scooters. Crutches. Turn on the lights when you go into a dark area. Replace any light bulbs as soon as they burn out. Set up your furniture so you have a clear path. Avoid moving your furniture around. If any of your floors are uneven, fix them. If there are any pets around you, be aware of where they are. Review your medicines with your doctor. Some medicines can make you feel dizzy. This can increase your chance of falling. Ask your doctor what other things that you can do to help prevent falls. This information is not intended to replace advice given to you by your health care provider. Make sure you discuss any questions you have with your health care provider. Document Released: 08/17/2009 Document Revised: 03/28/2016 Document Reviewed: 11/25/2014 Elsevier Interactive Patient Education  2017 [employer].

## 2023-04-23 NOTE — Progress Notes (Signed)
Subjective:   Brianna Mccarty is a 82 y.o. female who presents for Medicare Annual (Subsequent) preventive examination.  Visit Complete: Virtual  I connected with  [name] on 04/23/23 by a audio enabled telemedicine application and verified that I am speaking with the correct person using two identifiers.  Patient Location: Home  Provider Location: Home Office  I discussed the limitations of evaluation and management by telemedicine. The patient expressed understanding and agreed to proceed.  Patient Medicare AWV questionnaire was completed by the patient on ; I have confirmed that all information answered by patient is correct and no changes since this date.  Review of Systems          Objective:    Today's Vitals   04/23/23 1134  Weight: 165 lb (74.8 kg)  Height: 5' 0.3" (1.532 m)   Body mass index is 31.9 kg/m.     04/23/2023   11:44 AM 05/17/2022    4:01 PM 04/15/2022    2:03 PM 08/01/2021   10:49 AM 07/18/2021    1:29 PM 03/27/2021    1:16 PM 12/22/2019    6:56 AM  Advanced Directives  Does Patient Have a Medical Advance Directive? Yes Yes Yes Yes Yes Yes Yes  Type of Estate agent of [place];Living will Healthcare Power of [place];Living will Healthcare Power of [place];Living will Healthcare Power of [place];Living will Healthcare Power of [place];Living will Healthcare Power of Attorney   Does patient want to make changes to medical advance directive? No - Patient declined   No - Patient declined   No - Patient declined  Copy of Healthcare Power of Attorney in Chart? Yes - validated most recent copy scanned in chart (See row information)  Yes - validated most recent copy scanned in chart (See row information) Yes - validated most recent copy scanned in chart (See row information)  Yes - validated most recent copy scanned in chart (See row information)     Current Medications (verified) Outpatient Encounter Medications as of  04/23/2023  Medication Sig   acyclovir (ZOVIRAX) 400 MG tablet Take 1 tablet (400 mg total) by mouth 5 (five) times daily as needed (fever blisters).   albuterol (PROAIR HFA) 108 (90 Base) MCG/ACT inhaler Inhale 2 puffs into the lungs every 6 (six) hours as needed for wheezing.   albuterol (PROVENTIL) (2.5 MG/3ML) 0.083% nebulizer solution Take 3 mLs (2.5 mg total) by nebulization every 6 (six) hours as needed.   alendronate (FOSAMAX) 70 MG tablet TAKE 1 TABLET EVERY 7 (SEVEN) DAYS. TAKE WITH A FULL GLASS OF WATER ON AN EMPTY STOMACH.   amLODipine (NORVASC) 10 MG tablet Take 1 tablet (10 mg total) by mouth daily.   Ascorbic Acid (VITAMIN C PO) Take 1,000 mg by mouth daily.   azelastine (ASTELIN) 0.1 % nasal spray Instill 2 sprays into each nostril two times a day as needed for allergies   Bempedoic Acid-Ezetimibe (NEXLIZET) 180-10 MG TABS Take 1 tablet by mouth daily.   betamethasone dipropionate 0.05 % cream Apply 1 application topically 2 (two) times daily as needed (scaly skin).   budesonide-formoterol (SYMBICORT) 160-4.5 MCG/ACT inhaler Inhale 2 puffs into the lungs 2 (two) times daily.   CALCIUM PO Take 1,200 mg by mouth daily.   Cholecalciferol 125 MCG (5000 UT) TABS Take 1 tablet by mouth daily.   desmopressin (DDAVP) 0.2 MG tablet Take 0.2 mg by mouth at bedtime.   EPINEPHrine 0.3 mg/0.3 mL IJ SOAJ injection Inject 0.3 mg into  the muscle as needed for anaphylaxis.   ferrous sulfate 325 (65 FE) MG tablet TAKE 1 TABLET BY MOUTH TWICE A DAY WITH FOOD   fexofenadine (ALLEGRA) 180 MG tablet Take 180 mg by mouth daily.   furosemide (LASIX) 20 MG tablet TAKE 1 TABLET (20 MG TOTAL) BY MOUTH DAILY AS NEEDED FOR EDEMA.   hydrALAZINE (APRESOLINE) 50 MG tablet Take 1 tablet (50 mg total) by mouth 3 (three) times daily.   hydrOXYzine (ATARAX/VISTARIL) 10 MG tablet Take 10 mg by mouth at bedtime.    losartan (COZAAR) 100 MG tablet TAKE 1 TABLET BY MOUTH EVERY DAY   MAGNESIUM PO Take 250 mg by mouth  daily.   METAMUCIL FIBER PO Take 1 capsule by mouth once a week.   metoprolol tartrate (LOPRESSOR) 25 MG tablet Take 12.5 mg by mouth daily. Take half a tablet daily   montelukast (SINGULAIR) 10 MG tablet Take 10 mg by mouth at bedtime.   Multiple Vitamin (MULTIVITAMIN PO) Take 1 tablet by mouth daily.   omeprazole (PRILOSEC) 40 MG capsule TAKE 1 CAPSULE (40 MG TOTAL) BY MOUTH DAILY.   OVER THE COUNTER MEDICATION Juice Plus-daily   OVER THE COUNTER MEDICATION [place]op[place]a 3 oz once a day   pentosan polysulfate (ELMIRON) 100 MG capsule Take 100 mg by mouth daily.   prazosin (MINIPRESS) 5 MG capsule Take 1 capsule (5 mg total) by mouth 2 (two) times daily.   Probiotic Product (PROBIOTIC PO) Take 1 capsule by mouth daily.    No facility-administered encounter medications on file as of 04/23/2023.    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 mild by carotid duplex.   Cervical dysplasia 1971   Coronary artery disease    Diverticulosis of colon    DJD (degenerative joint disease)    Family history of cancer of extrahepatic bile ducts 09/05/2020   Family history of colon cancer 09/05/2020   Family history of pancreatic cancer 09/05/2020   GERD (gastroesophageal reflux disease)    Headache(784.0)    History of kidney stones    Hypercholesterolemia    Hypertension    Interstitial cystitis    sees urologist   Lichen sclerosus    Malignant melanoma (HCC) 2006   sees Dr. Margo Aye in dermatology   Migraines    Mild aortic stenosis    Mitral valve disease    Question mitral valve prolapse in the past, no prolapse  by echo 2009   Murmur 10/20/2015   SEES DR NELSON   OSA (obstructive sleep apnea)    USES DENTAL DEVICE   Osteoporosis    on fosomax > 5 years, stopped 11/2015   Pneumonia    SVT (supraventricular tachycardia) 02/15/2022   Monitor 02/2022: NSR, avg HR 61; 2 runs of NSVT (4 beats); several runs of Supraventricular Tachycardia (longest 3"11'); no AFib   Thyroid cyst    1 x 1.1 thyroid cyst noted on carotid Doppler, January, 2012   Venous insufficiency    Past Surgical History:  Procedure Laterality Date   BREAST BIOPSY Left 11/25/2011   U/S core, benign performed at [facility], LEFT BREAT  MARKER IN   BREAST CYST ASPIRATION     BREAST SURGERY  2013   Breast Bx-Benign   CARDIAC CATHETERIZATION     CATARACT EXTRACTION, BILATERAL     CHOLECYSTECTOMY N/A 12/22/2019   Procedure: LAPAROSCOPIC CHOLECYSTECTOMY;  Surgeon: Andria Meuse, MD;  Location: [facility];  Service: General;  Laterality: N/A;   cystoscopy and basket stone removal right ureter  02/2006   Dr. Dolphus Jenny SURGERY  12/22/2019   GYNECOLOGIC CRYOSURGERY  1971   left total hip replacement  2004   Dr. Darrelyn Hillock   melanoma removed from medial rleft knee area  2006   Dr. Margo Aye   NASAL SEPTUM SURGERY     TOTAL HIP ARTHROPLASTY Right 08/01/2021   Procedure: TOTAL HIP ARTHROPLASTY ANTERIOR APPROACH;  Surgeon: Ollen Gross, MD;  Location: [facility];  Service: Orthopedics;  Laterality: Right;   Family History  Problem Relation Age of Onset   Pancreatic cancer Father 33   Diabetes Mother    Heart disease Mother    Cancer Brother 70       Bile duct   Diabetes Brother    Hypertension Brother    Diabetes Brother    Heart disease Brother    Hypertension Brother    Hypertension Sister    Hypertension Sister    Colon cancer Paternal Uncle        dx 29s   Lung cancer Paternal Uncle        dx 78s   Social History   Socioeconomic History   Marital status: Married    Spouse name: Gerilyn Pilgrim   Number of  children: 2   Years of education: Not on file   Highest education level: Not on file  Occupational History   Occupation: retired    Associate Professor: RETIRED  Tobacco Use   Smoking status: Never    Passive exposure: Never   Smokeless tobacco: Never  Vaping Use   Vaping Use: Never used  Substance and Sexual Activity   Alcohol use: Yes    Alcohol/week: 1.0 standard drink of alcohol    Types: 1 Standard drinks or equivalent per week    Comment:  OCC WINE   Drug use: No   Sexual activity: Not Currently    Partners: Male    Birth control/protection: Post-menopausal    Comment: 1st intercourse 62 yo-1 partner  Other Topics Concern   Not on file  Social History Narrative   Updated 12/13/2019   Work or School: none      Home Situation: lives with husband      Spiritual Beliefs: christian      Lifestyle: regular exercise; healthy diet      12/13/2019: Uses treadmill 3/x weekly.    Looks after sister who has been having memory decline, as well  as husband with minor memory decline.    Enjoys writing poetry, reading, going to church, travel   Social Determinants of Health   Financial Resource Strain: Low Risk  (04/23/2023)   Overall Financial Resource Strain (CARDIA)    Difficulty of Paying Living Expenses: Not hard at all  Food Insecurity: No Food Insecurity (04/23/2023)   Hunger Vital Sign    Worried About Running Out of Food in the Last Year: Never true    Ran Out of Food in the Last Year: Never true  Transportation Needs: No Transportation Needs (04/23/2023)   PRAPARE - Administrator, Civil Service (Medical): No    Lack of Transportation (  Non-Medical): No  Physical Activity: Insufficiently Active (04/23/2023)   Exercise Vital Sign    Days of Exercise per Week: 2 days    Minutes of Exercise per Session: 30 min  Stress: No Stress Concern Present (04/23/2023)   [employer] of Occupational Health - Occupational Stress Questionnaire    Feeling of Stress : Not at all   Social Connections: Socially Integrated (04/23/2023)   Social Connection and Isolation Panel [NHANES]    Frequency of Communication with Friends and Family: More than three times a week    Frequency of Social Gatherings with Friends and Family: More than three times a week    Attends Religious Services: More than 4 times per year    Active Member of [employer] or Organizations: Yes    Attends Engineer, structural: More than 4 times per year    Marital Status: Married    Tobacco Counseling Counseling given: Not Answered   Clinical Intake:  Pre-visit preparation completed: No  Pain : No/denies pain     BMI - recorded: 31.9 Nutritional Status: BMI > 30  Obese Nutritional Risks: None Diabetes: No  How often do you need to have someone help you when you read instructions, pamphlets, or other written materials from your doctor or pharmacy?: 1 - Never  Interpreter Needed?: No  Information entered by :: Theresa Mulligan LPN   Activities of Daily Living    04/23/2023   11:41 AM  In your present state of health, do you have any difficulty performing the following activities:  Hearing? 0  Vision? 0  Difficulty concentrating or making decisions? 0  Walking or climbing stairs? 0  Dressing or bathing? 0  Doing errands, shopping? 0  Preparing Food and eating ? N  Using the Toilet? N  In the past six months, have you accidently leaked urine? Y  Comment Wears pads. Followed by medical attention  Do you have problems with loss of bowel control? Y  Comment Wears pads. Followed by medical attention  Managing your Medications? N  Managing your Finances? N  Housekeeping or managing your Housekeeping? N    Patient Care Team: [name], MD as PCP - General (Family Medicine) Meriam Sprague, MD as PCP - Cardiology (Cardiology) Lars Masson, MD as Consulting Physician (Cardiology) Meryl Dare, MD as Consulting Physician (Gastroenterology) Jerilee Field, MD as Consulting Physician (Urology) Irena Cords, Enzo Montgomery, MD as Consulting Physician (Allergy and Immunology) Mateo Flow, MD as Consulting Physician (Ophthalmology) Nita Sells, MD (Dermatology) Ranee Gosselin, MD as Consulting Physician (Orthopedic Surgery) [place], Milas Kocher, [facility] (Inactive) (Pharmacist)  Indicate any recent Medical Services you may have received from other than [facility] providers in the past year (date may be approximate).     Assessment:   This is a routine wellness examination for [place].  Hearing/Vision screen Hearing Screening - Comments:: Denies hearing difficulties   Vision Screening - Comments:: Wears reading glasses - up to date with routine eye exams with  [facility]  Dietary issues and exercise activities discussed:     Goals Addressed               This Visit's Progress     Lose weight (pt-stated)         Depression Screen    04/23/2023   11:40 AM 09/02/2022    2:15 PM 06/28/2022   12:58 PM 04/15/2022    2:04 PM 03/20/2022    2:40 PM 02/26/2022    4:45 PM 03/27/2021  1:14 PM  PHQ 2/9 Scores  PHQ - 2 Score 0 0 0 0 3 0 0  PHQ- 9 Score   1  6 2      Fall Risk    04/23/2023   11:43 AM 03/04/2023    2:20 PM 04/15/2022    2:04 PM 02/26/2022    4:45 PM 03/27/2021    1:18 PM  Fall Risk   Falls in the past year? 0 0 0 0 1  Number falls in past yr: 0 0 0 0 1  Injury with Fall? 0 0 0 0 0  Risk for fall due to : No Fall Risks No Fall Risks Medication side effect No Fall Risks Impaired vision  Follow up Falls prevention discussed  Falls evaluation completed;Education provided;Falls prevention discussed Falls evaluation completed Falls prevention discussed    MEDICARE RISK AT HOME:  Medicare Risk at Home - 04/23/23 1147     Any stairs in or around the home? Yes    If so, are there any without handrails? No    Home free of loose throw rugs in walkways, pet beds, electrical cords, etc? Yes    Adequate lighting in your home to reduce  risk of falls? Yes    Life alert? No    Use of a cane, walker or w/c? No    Grab bars in the bathroom? Yes    Shower chair or bench in shower? No    Elevated toilet seat or a handicapped toilet? No             TIMED UP AND GO:  Was the test performed?  No    Cognitive Function:        04/23/2023   11:44 AM 04/15/2022    2:08 PM 04/15/2022    2:06 PM 03/27/2021    1:24 PM 12/13/2019   10:55 AM  6CIT Screen  What Year? 0 points 0 points 0 points 0 points 0 points  What month? 0 points 0 points 0 points 0 points 0 points  What time? 0 points 0 points 0 points 0 points 0 points  Count back from 20 0 points 0 points 0 points 0 points 0 points  Months in reverse 0 points 0 points 0 points 0 points 0 points  Repeat phrase 0 points 0 points 0 points 0 points 0 points  Total Score 0 points 0 points 0 points 0 points 0 points    Immunizations Immunization History  Administered Date(s) Administered   Fluad Quad(high Dose 65+) 07/02/2019   Influenza Split 09/18/2011, 08/11/2012, 07/28/2014   Influenza Whole 07/21/2008, 08/04/2009, 08/01/2010   Influenza, High Dose Seasonal PF 07/19/2013, 07/28/2015, 10/17/2016, 08/11/2017, 09/01/2017, 08/04/2018, 09/11/2018, 09/01/2019, 06/30/2021, 08/01/2022   Influenza,inj,Quad PF,6+ Mos 07/03/2016   Moderna Covid-19 Vaccine Bivalent Booster 68yrs & up 09/24/2021   PFIZER(Purple Top)SARS-COV-2 Vaccination 11/20/2019, 12/08/2019, 03/22/2020   Pneumococcal Conjugate-13 12/06/2013   Pneumococcal Polysaccharide-23 10/03/2008, 10/17/2016, 11/24/2017, 09/11/2018, 07/12/2019, 09/01/2019   Tdap 11/04/2008, 10/24/2015   Zoster Recombinat (Shingrix) 07/30/2019, 09/03/2019, 02/21/2020   Zoster, Live 11/05/2003    TDAP status: Up to date  Flu Vaccine status: Up to date  Pneumococcal vaccine status: Up to date  Covid-19 vaccine status: Completed vaccines  Qualifies for Shingles Vaccine? Yes   Zostavax completed Yes   Shingrix Completed?:  Yes  Screening Tests Health Maintenance  Topic Date Due   COVID-19 Vaccine (5 - 2023-24 season) 05/09/2023 (Originally 07/05/2022)   INFLUENZA VACCINE  06/05/2023   MAMMOGRAM  04/16/2024  Medicare Annual Wellness (AWV)  04/22/2024   DTaP/Tdap/Td (3 - Td or Tdap) 10/23/2025   Pneumonia Vaccine 18+ Years old  Completed   DEXA SCAN  Completed   Zoster Vaccines- Shingrix  Completed   HPV VACCINES  Aged Out   Colonoscopy  Discontinued    Health Maintenance  There are no preventive care reminders to display for this patient.   Colorectal cancer screening: No longer required.   Mammogram status: No longer required due to Age.  Bone Density status: Completed 12/10/22. Results reflect: Bone density results: OSTEOPOROSIS. Repeat every   years.  Lung Cancer Screening: (Low Dose CT Chest recommended if Age 37-80 years, 20 pack-year currently smoking OR have quit w/in 15years.)  qualify.     Additional Screening:  Hepatitis C Screening: does not qualify; Completed   Vision Screening: Recommended annual ophthalmology exams for early detection of glaucoma and other disorders of the eye. Is the patient up to date with their annual eye exam?  Yes  Who is the provider or what is the name of the office in which the patient attends annual eye exams? [facility] If pt is not established with a provider, would they like to be referred to a provider to establish care? No .   Dental Screening: Recommended annual dental exams for proper oral hygiene    Community Resource Referral / Chronic Care Management:  CRR required this visit?  No   CCM required this visit?  No     Plan:     I have personally reviewed and noted the following in the patient's chart:   Medical and social history Use of alcohol, tobacco or illicit drugs  Current medications and supplements including opioid prescriptions. Patient is not currently taking opioid prescriptions. Functional ability and  status Nutritional status Physical activity Advanced directives List of other physicians Hospitalizations, surgeries, and ER visits in previous 12 months Vitals Screenings to include cognitive, depression, and falls Referrals and appointments  In addition, I have reviewed and discussed with patient certain preventive protocols, quality metrics, and best practice recommendations. A written personalized care plan for preventive services as well as general preventive health recommendations were provided to patient.     Tillie Rung, LPN   1/61/0960   After Visit Summary: (MyChart) Due to this being a telephonic visit, the after visit summary with patients personalized plan was offered to patient via MyChart   Nurse Notes: None

## 2023-04-29 ENCOUNTER — Telehealth: Payer: Self-pay | Admitting: Pharmacist

## 2023-04-29 ENCOUNTER — Telehealth: Payer: Self-pay

## 2023-04-29 NOTE — Progress Notes (Unsigned)
Reviewed patient in light of [facility] exit. Previously assisted with enrollment for [employer].   Elnita Maxwell, do you want to look this patient up in [facility] Provider Portal to have her enrollment dates? Can add to Medication Assistance Program Active FYI.   Looks like this was documented 5/20:    [facility] Card info for Nexlizet ID # [id number] Grp: [id number] BIN: [id number] PCN: [id number]

## 2023-04-29 NOTE — Progress Notes (Unsigned)
Reviewed patient in light of [facility] exit. Previously assisted with enrollment for [employer].    Brianna Maxwell, do you want to look this patient up in [facility] Provider Portal to have her enrollment dates? Can add to Medication Assistance Program Active FYI.    Looks like this was documented 5/20:      [facility] Card info for Nexlizet ID # [id number] Grp: [id number] BIN: [id number]

## 2023-04-30 DIAGNOSIS — J301 Allergic rhinitis due to pollen: Secondary | ICD-10-CM | POA: Diagnosis not present

## 2023-04-30 DIAGNOSIS — J3089 Other allergic rhinitis: Secondary | ICD-10-CM | POA: Diagnosis not present

## 2023-05-07 DIAGNOSIS — J301 Allergic rhinitis due to pollen: Secondary | ICD-10-CM | POA: Diagnosis not present

## 2023-05-07 DIAGNOSIS — J3089 Other allergic rhinitis: Secondary | ICD-10-CM | POA: Diagnosis not present

## 2023-05-07 DIAGNOSIS — J3081 Allergic rhinitis due to animal (cat) (dog) hair and dander: Secondary | ICD-10-CM | POA: Diagnosis not present

## 2023-05-13 DIAGNOSIS — Z08 Encounter for follow-up examination after completed treatment for malignant neoplasm: Secondary | ICD-10-CM | POA: Diagnosis not present

## 2023-05-13 DIAGNOSIS — L570 Actinic keratosis: Secondary | ICD-10-CM | POA: Diagnosis not present

## 2023-05-13 DIAGNOSIS — Z1283 Encounter for screening for malignant neoplasm of skin: Secondary | ICD-10-CM | POA: Diagnosis not present

## 2023-05-13 DIAGNOSIS — X32XXXD Exposure to sunlight, subsequent encounter: Secondary | ICD-10-CM | POA: Diagnosis not present

## 2023-05-13 DIAGNOSIS — Z85820 Personal history of malignant melanoma of skin: Secondary | ICD-10-CM | POA: Diagnosis not present

## 2023-05-14 DIAGNOSIS — J301 Allergic rhinitis due to pollen: Secondary | ICD-10-CM | POA: Diagnosis not present

## 2023-05-14 DIAGNOSIS — J3089 Other allergic rhinitis: Secondary | ICD-10-CM | POA: Diagnosis not present

## 2023-05-15 ENCOUNTER — Other Ambulatory Visit: Payer: Self-pay | Admitting: Family Medicine

## 2023-05-19 ENCOUNTER — Other Ambulatory Visit: Payer: Self-pay | Admitting: Physician Assistant

## 2023-05-19 ENCOUNTER — Other Ambulatory Visit: Payer: Self-pay | Admitting: Family Medicine

## 2023-05-20 DIAGNOSIS — J3081 Allergic rhinitis due to animal (cat) (dog) hair and dander: Secondary | ICD-10-CM | POA: Diagnosis not present

## 2023-05-20 DIAGNOSIS — J3089 Other allergic rhinitis: Secondary | ICD-10-CM | POA: Diagnosis not present

## 2023-05-20 DIAGNOSIS — J301 Allergic rhinitis due to pollen: Secondary | ICD-10-CM | POA: Diagnosis not present

## 2023-05-28 DIAGNOSIS — J3081 Allergic rhinitis due to animal (cat) (dog) hair and dander: Secondary | ICD-10-CM | POA: Diagnosis not present

## 2023-05-28 DIAGNOSIS — J3089 Other allergic rhinitis: Secondary | ICD-10-CM | POA: Diagnosis not present

## 2023-05-28 DIAGNOSIS — J301 Allergic rhinitis due to pollen: Secondary | ICD-10-CM | POA: Diagnosis not present

## 2023-06-09 DIAGNOSIS — M25562 Pain in left knee: Secondary | ICD-10-CM | POA: Diagnosis not present

## 2023-06-09 DIAGNOSIS — M25561 Pain in right knee: Secondary | ICD-10-CM | POA: Diagnosis not present

## 2023-06-12 ENCOUNTER — Telehealth: Payer: Self-pay | Admitting: Cardiology

## 2023-06-12 NOTE — Telephone Encounter (Signed)
Spoke with the patient who states that her heart rate has come down and is staying in the 60s. She is feeling good with no symptoms. Advised to continue to monitor and take extra 1/2 tablet if needed. If heart rate becomes consistently elevated she will let us know.  Patient previously followed by Dr. Shari Prows and has been set up to see Dr. Clifton James in the future. Will make him aware for any further recommendations.

## 2023-06-12 NOTE — Telephone Encounter (Signed)
  Pt c/o of Chest Pain: STAT if active CP, including tightness, pressure, jaw pain, radiating pain to shoulder/upper arm/back, CP unrelieved by Nitro. Symptoms reported of SOB, nausea, vomiting, sweating.  1. Are you having CP right now? No   2. Are you experiencing any other symptoms (ex. SOB, nausea, vomiting, sweating)?   No  3. Is your CP continuous or coming and going?   Coming and going  4. Have you taken Nitroglycerin?   No  5. How long have you been experiencing CP?  Started Wednesday when she woke up  6. If NO CP at time of call then end call with telling Pt to call back or call 911 if Chest pain returns prior to return call from triage team.   Patient stated she had shots in her knee on Monday and started having chest pains yesterday.  Patient stated she took additional metoprolol tartrate (LOPRESSOR) 25 MG tablet  medication and the pains went away.  Patient stated her HR was 117 which went down to 113.  BP 137/90.

## 2023-06-12 NOTE — Telephone Encounter (Signed)
Patient is calling to verify how much of the toprol xl can she takes. Patient is concerned that tonight her HR will go back up and wants to know what she should do if her hr goes back up.

## 2023-06-12 NOTE — Telephone Encounter (Signed)
Spoke with the patient who states that she had injections in both of her knees on Monday. Yesterday she started to have increased heart rate. She states that she does have some chest pressure with her increased heart rate, however this is not new for her. She takes Toprol XL 12.5 mg daily. She took an extra 1/2 tablet yesterday and again an extra 1/2 tablet today. She reports heart rate is still elevated this morning at 120. She denies any shortness of breath or dizziness. She does not have any palpitations either. I advised her to give the medication some time to kick in and to rest. She is a previous patient of Dr. Shari Prows who is supposed to be following up with Dr. Clifton James. I will make him aware for any further suggestions. She will let us know if extra dose of metoprolol does not help and/or she develops any symptoms.

## 2023-06-17 ENCOUNTER — Encounter (HOSPITAL_COMMUNITY): Payer: Self-pay

## 2023-06-17 ENCOUNTER — Emergency Department (HOSPITAL_COMMUNITY): Payer: HMO

## 2023-06-17 ENCOUNTER — Emergency Department (HOSPITAL_COMMUNITY)
Admission: EM | Admit: 2023-06-17 | Discharge: 2023-06-18 | Disposition: A | Discharge status: Home / Self Care | Payer: HMO | Attending: Emergency Medicine | Admitting: Emergency Medicine

## 2023-06-17 ENCOUNTER — Other Ambulatory Visit: Payer: Self-pay

## 2023-06-17 ENCOUNTER — Other Ambulatory Visit: Payer: Self-pay | Admitting: Family Medicine

## 2023-06-17 ENCOUNTER — Ambulatory Visit (INDEPENDENT_AMBULATORY_CARE_PROVIDER_SITE_OTHER)
Admission: EM | Admit: 2023-06-17 | Discharge: 2023-06-17 | Disposition: A | Discharge status: Home / Self Care | Payer: HMO | Source: Home / Self Care

## 2023-06-17 DIAGNOSIS — J811 Chronic pulmonary edema: Secondary | ICD-10-CM | POA: Diagnosis not present

## 2023-06-17 DIAGNOSIS — J45909 Unspecified asthma, uncomplicated: Secondary | ICD-10-CM | POA: Diagnosis not present

## 2023-06-17 DIAGNOSIS — R0789 Other chest pain: Secondary | ICD-10-CM | POA: Diagnosis not present

## 2023-06-17 DIAGNOSIS — I5030 Unspecified diastolic (congestive) heart failure: Secondary | ICD-10-CM | POA: Diagnosis not present

## 2023-06-17 DIAGNOSIS — I2510 Atherosclerotic heart disease of native coronary artery without angina pectoris: Secondary | ICD-10-CM | POA: Diagnosis not present

## 2023-06-17 DIAGNOSIS — R072 Precordial pain: Secondary | ICD-10-CM | POA: Insufficient documentation

## 2023-06-17 DIAGNOSIS — R0602 Shortness of breath: Secondary | ICD-10-CM | POA: Diagnosis not present

## 2023-06-17 DIAGNOSIS — R0989 Other specified symptoms and signs involving the circulatory and respiratory systems: Secondary | ICD-10-CM | POA: Diagnosis not present

## 2023-06-17 DIAGNOSIS — I10 Essential (primary) hypertension: Secondary | ICD-10-CM | POA: Insufficient documentation

## 2023-06-17 DIAGNOSIS — I700 Atherosclerosis of aorta: Secondary | ICD-10-CM | POA: Diagnosis not present

## 2023-06-17 DIAGNOSIS — Z1152 Encounter for screening for COVID-19: Secondary | ICD-10-CM | POA: Diagnosis not present

## 2023-06-17 DIAGNOSIS — I3481 Nonrheumatic mitral (valve) annulus calcification: Secondary | ICD-10-CM | POA: Diagnosis not present

## 2023-06-17 DIAGNOSIS — I110 Hypertensive heart disease with heart failure: Secondary | ICD-10-CM | POA: Diagnosis not present

## 2023-06-17 DIAGNOSIS — I959 Hypotension, unspecified: Secondary | ICD-10-CM | POA: Diagnosis not present

## 2023-06-17 DIAGNOSIS — I517 Cardiomegaly: Secondary | ICD-10-CM | POA: Diagnosis not present

## 2023-06-17 DIAGNOSIS — R918 Other nonspecific abnormal finding of lung field: Secondary | ICD-10-CM | POA: Diagnosis not present

## 2023-06-17 DIAGNOSIS — R079 Chest pain, unspecified: Secondary | ICD-10-CM | POA: Diagnosis not present

## 2023-06-17 LAB — BASIC METABOLIC PANEL
Anion gap: 11 (ref 5–15)
BUN: 20 mg/dL (ref 8–23)
CO2: 26 mmol/L (ref 22–32)
Calcium: 9.6 mg/dL (ref 8.9–10.3)
Chloride: 99 mmol/L (ref 98–111)
Creatinine, Ser: 1.09 mg/dL — ABNORMAL HIGH (ref 0.44–1.00)
GFR, Estimated: 50 mL/min — ABNORMAL LOW (ref >60)
Glucose, Bld: 111 mg/dL — ABNORMAL HIGH (ref 70–99)
Potassium: 4.3 mmol/L (ref 3.5–5.1)
Sodium: 136 mmol/L (ref 135–145)

## 2023-06-17 LAB — CBC
HCT: 35.4 % — ABNORMAL LOW (ref 36.0–46.0)
Hemoglobin: 12 g/dL (ref 12.0–15.0)
MCH: 30.5 pg (ref 26.0–34.0)
MCHC: 33.9 g/dL (ref 30.0–36.0)
MCV: 90.1 fL (ref 80.0–100.0)
Platelets: 236 10*3/uL (ref 150–400)
RBC: 3.93 MIL/uL (ref 3.87–5.11)
RDW: 12.8 % (ref 11.5–15.5)
WBC: 9.3 10*3/uL (ref 4.0–10.5)
nRBC: 0 % (ref 0.0–0.2)

## 2023-06-17 LAB — CBG MONITORING, ED: Glucose-Capillary: 102 mg/dL — ABNORMAL HIGH (ref 70–99)

## 2023-06-17 LAB — TROPONIN I (HIGH SENSITIVITY): Troponin I (High Sensitivity): 4 ng/L (ref <18)

## 2023-06-17 MED ORDER — ASPIRIN 81 MG PO CHEW
324.0000 mg | CHEWABLE_TABLET | Freq: Once | ORAL | Status: AC
  Administered 2023-06-17: 324 mg via ORAL

## 2023-06-17 NOTE — ED Triage Notes (Signed)
"  About a week ago yesterday I had 2 injections, the next night this threw me into Tachycardia". Following this "and Asthma Exacerbation". I have been following this closely with Cardiology (when the tachycardia was present). "Now having chest pain/pressure/sob" with the Asthma Exacerbation. No fever.

## 2023-06-17 NOTE — ED Provider Notes (Signed)
[name]    CSN: [id number] Arrival date & time: 06/17/23  1904      History   Chief Complaint Chief Complaint  Patient presents with   Chest Pain    HPI Brianna Mccarty is a 82 y.o. female.   Patient here today for evaluation of chest discomfort and pressure/ tightness she reports substernally.  She states she has had some shortness of breath but not always associated with chest discomfort.  She does have known asthma.  She states a week ago she did have 2 injections that threw her into tachycardia.  She is not currently having tachycardia.  She has not had fever.  She denies any lightheadedness.  The history is provided by the patient.  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 mild by carotid duplex.   Cervical dysplasia 1971   Coronary artery disease    Diverticulosis of colon    DJD (degenerative joint disease)    Family history of cancer of extrahepatic bile ducts 09/05/2020   Family history of colon cancer 09/05/2020   Family history of pancreatic cancer 09/05/2020   GERD (gastroesophageal reflux disease)    Headache(784.0)    History of kidney stones    Hypercholesterolemia    Hypertension    Interstitial cystitis    sees urologist   Lichen sclerosus    Malignant melanoma (HCC) 2006   sees Dr. Margo Aye in dermatology   Migraines    Mild aortic stenosis    Mitral valve disease    Question mitral valve prolapse in the past, no prolapse by echo 2009   Murmur 10/20/2015   SEES DR NELSON   OSA (obstructive sleep apnea)    USES DENTAL DEVICE   Osteoporosis    on fosomax > 5 years, stopped 11/2015    Pneumonia    SVT (supraventricular tachycardia) 02/15/2022   Monitor 02/2022: NSR, avg HR 61; 2 runs of NSVT (4 beats); several runs of Supraventricular Tachycardia (longest 3"11'); no AFib   Thyroid cyst    1 x 1.1 thyroid cyst noted on carotid Doppler, January, 2012   Venous insufficiency     Patient Active Problem List   Diagnosis Date Noted   Acute diverticulitis 07/11/2022   SVT (supraventricular tachycardia) 02/15/2022   Palpitations 01/15/2022   Mild aortic stenosis 01/15/2022   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   Genetic testing 09/13/2020   Family history of pancreatic cancer 09/05/2020   Family history of colon cancer 09/05/2020   Family history of cancer of extrahepatic bile ducts 09/05/2020   Fever 07/26/2020   History of COVID-19 07/26/2020   History of melanoma 05/12/2020   Macular degeneration 02/17/2019   OSA (obstructive sleep apnea) 02/12/2018   Prediabetes 02/12/2018   H/O cold sores 11/27/2015   Murmur 10/20/2015   Carotid artery disease (HCC)    Thyroid cyst    Venous (peripheral) insufficiency 06/02/2009   Asthma 04/27/2008   Melanoma of skin (HCC) 02/10/2008   Allergic rhinitis  02/10/2008   INTERSTITIAL CYSTITIS 02/10/2008   Pure hypercholesterolemia 02/09/2008   Essential hypertension 02/09/2008   GERD 02/09/2008   Osteoporosis 02/09/2008    Past Surgical History:  Procedure Laterality Date   BREAST BIOPSY Left 11/25/2011   U/S core, benign performed at [facility], LEFT BREAT MARKER IN   BREAST CYST ASPIRATION     BREAST SURGERY  2013   Breast Bx-Benign   CARDIAC CATHETERIZATION     CATARACT EXTRACTION, BILATERAL     CHOLECYSTECTOMY N/A 12/22/2019   Procedure: LAPAROSCOPIC CHOLECYSTECTOMY;  Surgeon: Andria Meuse, MD;  Location: [facility];  Service: General;  Laterality: N/A;   cystoscopy and basket stone removal right ureter   02/2006   Dr. Dolphus Jenny SURGERY  12/22/2019   GYNECOLOGIC CRYOSURGERY  1971   left total hip replacement  2004   Dr. Darrelyn Hillock   melanoma removed from medial rleft knee area  2006   Dr. Margo Aye   NASAL SEPTUM SURGERY     TOTAL HIP ARTHROPLASTY Right 08/01/2021   Procedure: TOTAL HIP ARTHROPLASTY ANTERIOR APPROACH;  Surgeon: Ollen Gross, MD;  Location: [facility];  Service: Orthopedics;  Laterality: Right;    OB History     Gravida  2   Para  2   Term  2   Preterm      AB      Living  2      SAB      IAB      Ectopic      Multiple      Live Births               Home Medications    Prior to Admission medications   Medication Sig Start Date End Date Taking? Authorizing Provider  albuterol (PROAIR HFA) 108 (90 Base) MCG/ACT inhaler Inhale 2 puffs into the lungs every 6 (six) hours as needed for wheezing. Patient taking differently: Inhale 2 puffs into the lungs every 6 (six) hours as needed for wheezing. Last dose: 4pm today. 09/10/18  Yes Michele Mcalpine, MD  alendronate (FOSAMAX) 70 MG tablet TAKE 1 TABLET EVERY 7 (SEVEN) DAYS. TAKE WITH A FULL GLASS OF WATER ON AN EMPTY STOMACH. 10/09/22  Yes [name], MD  amLODipine (NORVASC) 10 MG tablet Take 1 tablet (10 mg total) by mouth daily. 03/24/23  Yes [name], MD  Ascorbic Acid (VITAMIN C PO) Take 1,000 mg by mouth daily.   Yes [provider]  azelastine (ASTELIN) 0.1 % nasal spray Instill 2 sprays into each nostril two times a day as needed for allergies 02/03/21  Yes Bing Neighbors, NP  Bempedoic Acid-Ezetimibe (NEXLIZET) 180-10 MG TABS Take 1 tablet by mouth daily. 10/07/22  Yes Meriam Sprague, MD  CALCIUM PO Take 1,200 mg by mouth daily.   Yes [provider]  Cholecalciferol 125 MCG (5000 UT) TABS Take 1 tablet by mouth daily.   Yes [provider]  desmopressin (DDAVP) 0.2 MG tablet Take 0.2 mg by mouth at bedtime.   Yes [provider]   ferrous sulfate 325 (65 FE) MG tablet TAKE 1 TABLET BY MOUTH TWICE A DAY WITH FOOD 03/27/23  Yes Meryl Dare, MD  furosemide (LASIX) 20 MG tablet TAKE 1 TABLET (20 MG TOTAL) BY MOUTH DAILY AS NEEDED FOR EDEMA. 05/15/23  Yes [name], MD  hydrALAZINE (APRESOLINE) 50 MG tablet Take 1 tablet (50 mg total) by mouth 3 (three) times daily. 04/15/23  Yes Gaston Islam., NP  hydrOXYzine (ATARAX/VISTARIL) 10 MG tablet Take 10 mg by mouth at bedtime.    Yes [provider]  losartan (COZAAR) 100 MG tablet TAKE 1 TABLET BY MOUTH EVERY DAY 05/19/23  Yes [name], MD  MAGNESIUM PO Take 250 mg by mouth daily.   Yes [provider]  metoprolol succinate (TOPROL-XL) 25 MG 24 hr tablet TAKE 1 TABLET (25 MG TOTAL) BY MOUTH DAILY. 05/19/23  Yes Meriam Sprague, MD  metoprolol tartrate (LOPRESSOR) 25 MG tablet Take 12.5 mg by mouth daily. Take half a tablet daily   Yes [provider]  montelukast (SINGULAIR) 10 MG tablet Take 10 mg by mouth at bedtime.   Yes [provider]  Multiple Vitamin (MULTIVITAMIN PO) Take 1 tablet by mouth daily.   Yes [provider]  omeprazole (PRILOSEC) 40 MG capsule TAKE 1 CAPSULE (40 MG TOTAL) BY MOUTH DAILY. 06/17/23  Yes [name], MD  prazosin (MINIPRESS) 5 MG capsule Take 1 capsule (5 mg total) by mouth 2 (two) times daily. 03/24/23  Yes [name], MD  acyclovir (ZOVIRAX) 400 MG tablet Take 1 tablet (400 mg total) by mouth 5 (five) times daily as needed (fever blisters). 02/21/22   Wyline Beady A, NP  albuterol (PROVENTIL) (2.5 MG/3ML) 0.083% nebulizer solution Take 3 mLs (2.5 mg total) by nebulization every 6 (six) hours as needed. 09/10/18 07/09/29  Michele Mcalpine, MD  betamethasone dipropionate 0.05 % cream Apply 1 application topically 2 (two) times daily as needed (scaly skin). 03/29/21   [provider]  budesonide-formoterol (SYMBICORT) 160-4.5 MCG/ACT inhaler Inhale 2 puffs into  the lungs 2 (two) times daily. 10/05/21   Wynn Banker, MD  EPINEPHrine 0.3 mg/0.3 mL IJ SOAJ injection Inject 0.3 mg into the muscle as needed for anaphylaxis. 12/21/18   [provider]  fexofenadine (ALLEGRA) 180 MG tablet Take 180 mg by mouth daily.    [provider]  METAMUCIL FIBER PO Take 1 capsule by mouth once a week.    [provider]  OVER THE COUNTER MEDICATION Juice Plus-daily    [provider]  OVER THE COUNTER MEDICATION Nopolea 3 oz once a day    [provider]  pentosan polysulfate (ELMIRON) 100 MG capsule Take 100 mg by mouth daily.    [provider]  Probiotic Product (PROBIOTIC PO) Take 1 capsule by mouth daily.     [provider]    Family History Family History  Problem Relation Age of Onset   Pancreatic cancer Father 72   Diabetes Mother    Heart disease Mother    Cancer Brother 90       Bile duct   Diabetes Brother    Hypertension Brother    Diabetes Brother    Heart disease Brother    Hypertension Brother    Hypertension Sister    Hypertension Sister    Colon cancer Paternal Uncle        dx 76s   Lung cancer Paternal Uncle        dx 37s    Social History Social History   Tobacco Use   Smoking status: Never    Passive exposure: Never   Smokeless tobacco: Never  Vaping Use   Vaping status: Never Used  Substance Use Topics   Alcohol use: Yes    Alcohol/week: 1.0 standard drink of alcohol    Types: 1 Standard drinks or equivalent per week  Comment:  OCC WINE   Drug use: No     Allergies   Celebrex [celecoxib], Cephalexin, Hydrochlorothiazide, Penicillin g benzathine, Penicillins, Phenobarbital, Statins, Passion fruit flavor, and Tape   Review of Systems Review of Systems  Constitutional:  Negative for chills and fever.  Eyes:  Negative for discharge and redness.  Respiratory:  Positive for chest tightness and shortness of breath.   Cardiovascular:  Positive for  chest pain.  Gastrointestinal:  Negative for nausea and vomiting.  Neurological:  Negative for light-headedness.     Physical Exam Triage Vital Signs ED Triage Vitals  Encounter Vitals Group     BP 06/17/23 1912 (!) 148/68     Systolic BP Percentile --      Diastolic BP Percentile --      Pulse Rate 06/17/23 1912 62     Resp 06/17/23 1912 (!) 24     Temp 06/17/23 1912 98 F (36.7 C)     Temp Source 06/17/23 1912 Oral     SpO2 06/17/23 1912 96 %     Weight 06/17/23 1915 164 lb 14.5 oz (74.8 kg)     Height 06/17/23 1915 5' 0.3" (1.532 m)     Head Circumference --      Peak Flow --      Pain Score 06/17/23 1914 4     Pain Loc --      Pain Education --      Exclude from Growth Chart --    No data found.  Updated Vital Signs BP (!) 144/68 (BP Location: Right Arm)   Pulse 62   Temp 98 F (36.7 C) (Oral)   Resp (!) 22   Ht 5' 0.3" (1.532 m)   Wt 164 lb 14.5 oz (74.8 kg)   SpO2 96%   BMI 31.89 kg/m      Physical Exam Vitals and nursing note reviewed.  Constitutional:      General: She is not in acute distress.    Appearance: Normal appearance. She is not ill-appearing.  HENT:     Head: Normocephalic and atraumatic.  Eyes:     Conjunctiva/sclera: Conjunctivae normal.  Cardiovascular:     Rate and Rhythm: Bradycardia present.     Heart sounds: Murmur heard.  Pulmonary:     Effort: Pulmonary effort is normal. No respiratory distress.     Breath sounds: Wheezing (rare scattered wheeze) present. No rhonchi or rales.  Neurological:     Mental Status: She is alert.  Psychiatric:        Mood and Affect: Mood normal.        Behavior: Behavior normal.        Thought Content: Thought content normal.      UC Treatments / Results  Labs (all labs ordered are listed, but only abnormal results are displayed) Labs Reviewed - No data to display  EKG   Radiology No results found.  Procedures Procedures (including critical care time)  Medications Ordered in  UC Medications  aspirin chewable tablet 324 mg (324 mg Oral Given 06/17/23 1938)    Initial Impression / Assessment and Plan / UC Course  I have reviewed the triage vital signs and the nursing notes.  Pertinent labs & imaging results that were available during my care of the patient were reviewed by me and considered in my medical decision making (see chart for details).    Questionable EKG changes compared to June 2024 EKG.  With symptoms including chest tightness and pressure recommended further  evaluation in the emergency room for cardiac rule out.  Patient is agreeable to same and will be transported via EMS.  Final Clinical Impressions(s) / UC Diagnoses   Final diagnoses:  None   Discharge Instructions   None    ED Prescriptions   None    PDMP not reviewed this encounter.   Tomi Bamberger, PA-C 06/17/23 1950

## 2023-06-17 NOTE — ED Provider Triage Note (Signed)
Emergency Medicine Provider Triage Evaluation Note  ROIZY DEXHEIMER , a 82 y.o. female  was evaluated in triage.  Pt complains of chest pain localized to the middle of the chest for the past 3 days.  Review of Systems  Positive: CP Negative: SOB, weakness  Physical Exam  BP 132/60 (BP Location: Right Arm)   Pulse (!) 55   Temp 98.2 F (36.8 C) (Oral)   Resp 14   SpO2 98%  Gen:   Awake, no distress   Resp:  Normal effort  MSK:   Moves extremities without difficulty  Other:    Medical Decision Making  Medically screening exam initiated at 8:45 PM.  Appropriate orders placed.  LEANDER FAVRET was informed that the remainder of the evaluation will be completed by another provider, this initial triage assessment does not replace that evaluation, and the importance of remaining in the ED until their evaluation is complete.    Maxwell Marion, PA-C 06/17/23 2048

## 2023-06-17 NOTE — ED Notes (Signed)
Patient is being discharged from the Urgent Care and sent to the Emergency Department via EMS . Per R. Reggie Pile, patient is in need of higher level of care due to Chest Pain, EKG Changes. Patient is aware and verbalizes understanding of plan of care.  Vitals:   06/17/23 1912 06/17/23 1917  BP: (!) 148/68 (!) 144/68  Pulse: 62   Resp: (!) 24 (!) 22  Temp: 98 F (36.7 C)   SpO2: 96% 96%

## 2023-06-17 NOTE — ED Triage Notes (Signed)
Pt bib ems from UC for chest tightness since Saturday. Pt has hx of asthma, taking home neb treatments with some relief up until today. Pt sent from UC due to the chest pain. UC gave 324 ASA prior to EMS arrival. Pt currently denies any chest tightness, lung sounds clear.

## 2023-06-17 NOTE — ED Notes (Signed)
Called EMS at [phone] for EMS Transport (Emergent) per Provider.

## 2023-06-17 NOTE — ED Notes (Signed)
Patient is being discharged from the Urgent Care and sent to the Emergency Department via EMS . Per RM, patient is in need of higher level of care due to CP/SOB. Patient is aware and verbalizes understanding of plan of care.  Vitals:   06/17/23 1912 06/17/23 1917  BP: (!) 148/68 (!) 144/68  Pulse: 62   Resp: (!) 24 (!) 22  Temp: 98 F (36.7 C)   SpO2: 96% 96%

## 2023-06-18 ENCOUNTER — Other Ambulatory Visit: Payer: Self-pay

## 2023-06-18 ENCOUNTER — Emergency Department (HOSPITAL_COMMUNITY): Payer: HMO

## 2023-06-18 DIAGNOSIS — R079 Chest pain, unspecified: Secondary | ICD-10-CM | POA: Diagnosis not present

## 2023-06-18 DIAGNOSIS — I700 Atherosclerosis of aorta: Secondary | ICD-10-CM | POA: Diagnosis not present

## 2023-06-18 DIAGNOSIS — R0602 Shortness of breath: Secondary | ICD-10-CM | POA: Diagnosis not present

## 2023-06-18 DIAGNOSIS — R918 Other nonspecific abnormal finding of lung field: Secondary | ICD-10-CM | POA: Diagnosis not present

## 2023-06-18 LAB — BRAIN NATRIURETIC PEPTIDE: B Natriuretic Peptide: 240 pg/mL — ABNORMAL HIGH (ref 0.0–100.0)

## 2023-06-18 LAB — RESP PANEL BY RT-PCR (RSV, FLU A&B, COVID)  RVPGX2
Influenza A by PCR: NEGATIVE
Influenza B by PCR: NEGATIVE
Resp Syncytial Virus by PCR: NEGATIVE
SARS Coronavirus 2 by RT PCR: NEGATIVE

## 2023-06-18 MED ORDER — FUROSEMIDE 10 MG/ML IJ SOLN
40.0000 mg | Freq: Once | INTRAMUSCULAR | Status: AC
  Administered 2023-06-18: 40 mg via INTRAVENOUS
  Filled 2023-06-18: qty 4

## 2023-06-18 MED ORDER — IOHEXOL 350 MG/ML SOLN
75.0000 mL | Freq: Once | INTRAVENOUS | Status: AC | PRN
  Administered 2023-06-18: 75 mL via INTRAVENOUS

## 2023-06-18 NOTE — ED Provider Notes (Signed)
[place] [facility]GENC[facility] DEPARTMENT AT [facility] Provider Note   CSN: [id number] Arrival date & time: 06/17/23  2027     History  Chief Complaint  Patient presents with   Chest Pain    Brianna Mccarty is a 82 y.o. female.  HPI   Patient with medical history including hypertension, SVT, CAD, ascending aortic dilation, asthma, presenting with complaints of chest pressure.  Patient states that over the weekend she was having some wheezing, and chest pressure, she states that she took albuterol without much relief, she states that her wheezing felt worse than usual, and felt the pressure was deep inside her chest, she denies pleuritic chest pain, shortness of breath, she denies any chest pain with exertion, denies any worsening peripheral edema, or orthopnea.  No associated fevers chills general body aches, she does endorse that her husband was active with COVID she has had a slight nonproductive cough, states that she went to urgent care and she was referred here for further assessment, no history of PEs or DVTs currently not on oral birth control no recent surgeries no long immobilizations.  Followed by cardiology, had stress test as well as echo back in 2019, stress test was unremarkable, echo revealed ejection fraction 55% with stage I diastolic heart failure, she is currently on 40 of Lasix 3 times as needed.  Home Medications Prior to Admission medications   Medication Sig Start Date End Date Taking? Authorizing Provider  acyclovir (ZOVIRAX) 400 MG tablet Take 1 tablet (400 mg total) by mouth 5 (five) times daily as needed (fever blisters). 02/21/22   Olivia Mackie, NP  albuterol (PROAIR HFA) 108 (90 Base) MCG/ACT inhaler Inhale 2 puffs into the lungs every 6 (six) hours as needed for wheezing. Patient taking differently: Inhale 2 puffs into the lungs every 6 (six) hours as needed for wheezing. Last dose: 4pm today. 09/10/18   Michele Mcalpine, MD  albuterol (PROVENTIL) (2.5  MG/3ML) 0.083% nebulizer solution Take 3 mLs (2.5 mg total) by nebulization every 6 (six) hours as needed. 09/10/18 07/09/29  Michele Mcalpine, MD  alendronate (FOSAMAX) 70 MG tablet TAKE 1 TABLET EVERY 7 (SEVEN) DAYS. TAKE WITH A FULL GLASS OF WATER ON AN EMPTY STOMACH. 10/09/22   [name], MD  amLODipine (NORVASC) 10 MG tablet Take 1 tablet (10 mg total) by mouth daily. 03/24/23   [name], MD  Ascorbic Acid (VITAMIN C PO) Take 1,000 mg by mouth daily.    [provider]  azelastine (ASTELIN) 0.1 % nasal spray Instill 2 sprays into each nostril two times a day as needed for allergies 02/03/21   Bing Neighbors, NP  Bempedoic Acid-Ezetimibe (NEXLIZET) 180-10 MG TABS Take 1 tablet by mouth daily. 10/07/22   Meriam Sprague, MD  betamethasone dipropionate 0.05 % cream Apply 1 application topically 2 (two) times daily as needed (scaly skin). 03/29/21   [provider]  budesonide-formoterol (SYMBICORT) 160-4.5 MCG/ACT inhaler Inhale 2 puffs into the lungs 2 (two) times daily. 10/05/21   Wynn Banker, MD  CALCIUM PO Take 1,200 mg by mouth daily.    [provider]  Cholecalciferol 125 MCG (5000 UT) TABS Take 1 tablet by mouth daily.    [provider]  desmopressin (DDAVP) 0.2 MG tablet Take 0.2 mg by mouth at bedtime.    [provider]  EPINEPHrine 0.3 mg/0.3 mL IJ SOAJ injection Inject 0.3 mg into the muscle as needed for anaphylaxis. 12/21/18  [provider]  ferrous sulfate 325 (65 FE) MG tablet TAKE 1 TABLET BY MOUTH TWICE A DAY WITH FOOD 03/27/23   Meryl Dare, MD  fexofenadine (ALLEGRA) 180 MG tablet Take 180 mg by mouth daily.    [provider]  furosemide (LASIX) 20 MG tablet TAKE 1 TABLET (20 MG TOTAL) BY MOUTH DAILY AS NEEDED FOR EDEMA. 05/15/23   [name], MD  hydrALAZINE (APRESOLINE) 50 MG tablet Take 1 tablet (50 mg total) by mouth 3 (three) times daily. 04/15/23   Gaston Islam., NP   hydrOXYzine (ATARAX/VISTARIL) 10 MG tablet Take 10 mg by mouth at bedtime.     [provider]  losartan (COZAAR) 100 MG tablet TAKE 1 TABLET BY MOUTH EVERY DAY 05/19/23   [name], MD  MAGNESIUM PO Take 250 mg by mouth daily.    [provider]  METAMUCIL FIBER PO Take 1 capsule by mouth once a week.    [provider]  metoprolol succinate (TOPROL-XL) 25 MG 24 hr tablet TAKE 1 TABLET (25 MG TOTAL) BY MOUTH DAILY. 05/19/23   Meriam Sprague, MD  metoprolol tartrate (LOPRESSOR) 25 MG tablet Take 12.5 mg by mouth daily. Take half a tablet daily    [provider]  montelukast (SINGULAIR) 10 MG tablet Take 10 mg by mouth at bedtime.    [provider]  Multiple Vitamin (MULTIVITAMIN PO) Take 1 tablet by mouth daily.    [provider]  omeprazole (PRILOSEC) 40 MG capsule TAKE 1 CAPSULE (40 MG TOTAL) BY MOUTH DAILY. 06/17/23   [name], MD  OVER THE COUNTER MEDICATION Juice Plus-daily    [provider]  OVER THE COUNTER MEDICATION Nopolea 3 oz once a day    [provider]  pentosan polysulfate (ELMIRON) 100 MG capsule Take 100 mg by mouth daily.    [provider]  prazosin (MINIPRESS) 5 MG capsule Take 1 capsule (5 mg total) by mouth 2 (two) times daily. 03/24/23   [name], MD  Probiotic Product (PROBIOTIC PO) Take 1 capsule by mouth daily.     [provider]      Allergies    Celebrex [celecoxib], Cephalexin, Hydrochlorothiazide, Penicillin g benzathine, Penicillins, Phenobarbital, Statins, Passion fruit flavor, and Tape    Review of Systems   Review of Systems  Constitutional:  Negative for chills and fever.  Respiratory:  Positive for cough and chest tightness. Negative for shortness of breath.   Cardiovascular:  Negative for chest pain.  Gastrointestinal:  Negative for abdominal pain.  Neurological:  Negative for headaches.    Physical Exam Updated Vital  Signs BP (!) 143/82 (BP Location: Left Arm)   Pulse 98   Temp 97.9 F (36.6 C) (Oral)   Resp 16   SpO2 97%  Physical Exam Vitals and nursing note reviewed.  Constitutional:      General: She is not in acute distress.    Appearance: She is not ill-appearing.  HENT:     Head: Normocephalic and atraumatic.     Nose: No congestion.  Eyes:     Conjunctiva/sclera: Conjunctivae normal.  Cardiovascular:     Rate and Rhythm: Regular rhythm. Tachycardia present.     Pulses: Normal pulses.     Heart sounds: No murmur heard.    No friction rub. No gallop.  Pulmonary:     Effort: No respiratory distress.     Breath sounds: No wheezing, rhonchi or rales.  Musculoskeletal:     Right lower leg: No edema.     Left lower leg: No edema.     Comments: No unilateral leg swelling no calf tenderness no palpable cords.  Skin:    General: Skin is warm and dry.  Neurological:     Mental Status: She is alert.  Psychiatric:        Mood and Affect: Mood normal.     ED Results / Procedures / Treatments   Labs (all labs ordered are listed, but only abnormal results are displayed) Labs Reviewed  BASIC METABOLIC PANEL - Abnormal; Notable for the following components:      Result Value   Glucose, Bld 111 (*)    Creatinine, Ser 1.09 (*)    GFR, Estimated 50 (*)    All other components within normal limits  CBC - Abnormal; Notable for the following components:   HCT 35.4 (*)    All other components within normal limits  BRAIN NATRIURETIC PEPTIDE - Abnormal; Notable for the following components:   B Natriuretic Peptide 240.0 (*)    All other components within normal limits  CBG MONITORING, ED - Abnormal; Notable for the following components:   Glucose-Capillary 102 (*)    All other components within normal limits  RESP PANEL BY RT-PCR (RSV, FLU A&B, COVID)  RVPGX2  TROPONIN I (HIGH SENSITIVITY)  TROPONIN I (HIGH SENSITIVITY)    EKG None  Radiology CT Angio Chest PE W and/or Wo  Contrast  Result Date: 06/18/2023 CLINICAL DATA:  Shortness of breath and chest pain EXAM: CT ANGIOGRAPHY CHEST WITH CONTRAST TECHNIQUE: Multidetector CT imaging of the chest was performed using the standard protocol during bolus administration of intravenous contrast. Multiplanar CT image reconstructions and MIPs were obtained to evaluate the vascular anatomy. RADIATION DOSE REDUCTION: This exam was performed according to the departmental dose-optimization program which includes automated exposure control, adjustment of the mA and/or kV according to patient size and/or use of iterative reconstruction technique. CONTRAST:  75mL OMNIPAQUE IOHEXOL 350 MG/ML SOLN COMPARISON:  Chest x-ray from earlier in the same day. CT from 04/24/2022 FINDINGS: Cardiovascular: Atherosclerotic calcifications of the thoracic aorta are noted. No aneurysmal dilatation or dissection is noted. Mild cardiac enlargement is seen. Postsurgical changes are noted in the mitral valve. The pulmonary artery shows a normal branching pattern bilaterally. No filling defect to suggest pulmonary embolism is noted. Mediastinum/Nodes: Thoracic inlet is within normal limits. No hilar or mediastinal adenopathy is noted. The esophagus as visualized is within normal limits. Lungs/Pleura: Lungs are well aerated bilaterally. A few scattered small nodules are seen bilaterally. The largest of these measures 4 mm in dimension. This is stable in appearance from prior exam. No new nodules are seen. No effusion is noted. Upper Abdomen: Visualized upper abdomen shows no acute abnormality. Musculoskeletal: Degenerative changes of the thoracic spine are noted. Review of the MIP images confirms the above findings. IMPRESSION: No evidence of pulmonary emboli. Small pulmonary nodules stable from the prior exam. No further follow-up is recommended. No acute abnormality noted. Aortic Atherosclerosis (ICD10-I70.0). Electronically Signed   By: Alcide Clever M.D.   On:  06/18/2023 04:02   DG Chest Portable 1 View  Result Date: 06/17/2023 CLINICAL DATA:  Chest pain EXAM: PORTABLE CHEST 1 VIEW COMPARISON:  03/03/2022 FINDINGS: Cardiac enlargement with mild vascular congestion. Calcification in the mitral valve annulus. Aortic calcification. No airspace disease or consolidation in the lungs. No pleural effusions. No pneumothorax. Mediastinal contours appear intact. IMPRESSION: Cardiac enlargement with mild  vascular congestion. No edema or consolidation. Electronically Signed   By: Burman Nieves M.D.   On: 06/17/2023 21:30    Procedures Procedures    Medications Ordered in ED Medications  furosemide (LASIX) injection 40 mg (40 mg Intravenous Given 06/18/23 0337)  iohexol (OMNIPAQUE) 350 MG/ML injection 75 mL (75 mLs Intravenous Contrast Given 06/18/23 0354)    ED Course/ Medical Decision Making/ A&P                                 Medical Decision Making Amount and/or Complexity of Data Reviewed Labs: ordered. Radiology: ordered.  Risk Prescription drug management.   This patient presents to the ED for concern of chest pressure, this involves an extensive number of treatment options, and is a complaint that carries with it a high risk of complications and morbidity.  The differential diagnosis includes ACS, PE, pneumonia, URI, CHF    Additional history obtained:  Additional history obtained from husband at bedside External records from outside source obtained and reviewed including radiology notes   Co morbidities that complicate the patient evaluation  CAD, SVT,  Social Determinants of Health:  geriatric    Lab Tests:  I Ordered, and personally interpreted labs.  The pertinent results include: CBC is unremarkable, BMP reveals a glucose of 111, creatinine 1.09, GFR 50, BNP slightly elevated 240, negative delta troponin   Imaging Studies ordered:  I ordered imaging studies including chest x-ray, CT of chest I independently  visualized and interpreted imaging which showed chest x-ray shows mild vascular congestion, CTA negative acute findings I agree with the radiologist interpretation   Cardiac Monitoring:  The patient was maintained on a cardiac monitor.  I personally viewed and interpreted the cardiac monitored which showed an underlying rhythm of: Without signs of ischemia   Medicines ordered and prescription drug management:  I ordered medication including Lasix I have reviewed the patients home medicines and have made adjustments as needed  Critical Interventions:  N/A   Reevaluation:  Presents with chest pressure, triage obtained basic lab workup imaging, notable congestion seen on chest x-ray, she is slightly tachycardic, presentation is concerning for possible PE, will provide her with Lasix, and sent her for CT of chest for further assessment.  Patient was reassessed resting comfortably not endorsing any wheezing, chest tightness, states she is ready for discharge.  Consultations Obtained:  N/A    Test Considered:  N/A    Rule out I have low suspicion for ACS as history is atypical, EKG was sinus rhythm without signs of ischemia, patient has negative delta troponin.  Low suspicion for PE CTA is negative.  Doubt patient needs to be admitted for CHF exacerbation she does not appear to be volume overloaded my exam, no lower limb edema, no Rales, no effusion seen on chest x-ray, vital signs reassuring.  Low suspicion for AAA or aortic dissection as history is atypical, patient has low risk factors.  Low suspicion for systemic infection as patient is nontoxic-appearing, vital signs reassuring, no obvious source infection noted on exam.     Dispostion and problem list  After consideration of the diagnostic results and the patients response to treatment, I feel that the patent would benefit from discharge.  Chest tightness-since resolved, suspect multifactorial, possible URI, with  worsening CHF, patient has taken 40 of Lasix yesterday and received additional dose of Lasix today, will have her follow-up with her cardiologist for further evaluation,  likely needs repeat echocardiogram.            Final Clinical Impression(s) / ED Diagnoses Final diagnoses:  Precordial pain  Pulmonary vascular congestion    Rx / DC Orders ED Discharge Orders     None         Carroll Sage, PA-C 06/18/23 0502    Gilda Crease, MD 06/20/23 [phone]

## 2023-06-18 NOTE — Discharge Instructions (Signed)
Lab workup imaging shows she has some fluid within your lungs, you may take additional dose of Lasix tomorrow if you feel that you have fluid on you.  Please continue all your home medications.  I would like you follow-up with your cardiologist for further evaluation as I would like you to have an echocardiogram.  Come back to the emergency department if you develop chest pain, shortness of breath, severe abdominal pain, uncontrolled nausea, vomiting, diarrhea.

## 2023-06-19 ENCOUNTER — Telehealth: Payer: Self-pay

## 2023-06-19 ENCOUNTER — Other Ambulatory Visit (HOSPITAL_COMMUNITY): Payer: Self-pay

## 2023-06-19 NOTE — Telephone Encounter (Addendum)
Pharmacy Patient Advocate Encounter   Received notification from CoverMyMeds that prior authorization for NEXLIZET  is required/requested.   Insurance verification completed.   The patient is insured through [employer]/ [employer] .   Per test claim: PA required; PA submitted to [employer]/ [employer] via EMAIL/FAX Key/confirmation #/EOC [id number]   Status is pending   (PLEASE NOTE THIS DETERMINATION WILL COME IN VIA ONBASE/FAX)

## 2023-06-20 ENCOUNTER — Other Ambulatory Visit (HOSPITAL_COMMUNITY): Payer: Self-pay

## 2023-06-20 ENCOUNTER — Other Ambulatory Visit: Payer: Self-pay | Admitting: Family Medicine

## 2023-06-20 ENCOUNTER — Telehealth: Payer: Self-pay | Admitting: Cardiology

## 2023-06-20 NOTE — Telephone Encounter (Signed)
Appt made with Jari Favre PA for 8/.23/24 at 2:45 pm  Pt aware  May order echo at the time of visit if needs to be done earlier than the October time cy

## 2023-06-20 NOTE — Telephone Encounter (Signed)
Pharmacy Patient Advocate Encounter  Received notification from [employer]/ [employer] that Prior Authorization for Nexlizet 180-10MG  tablets has been APPROVED from 06/19/23 to 12/20/23. Ran test claim, Copay is $200.00. This test claim was processed through [facility]- copay amounts may vary at other pharmacies due to pharmacy/plan contracts, or as the patient moves through the different stages of their insurance plan.   PA #/Case ID/Reference #: [id number]

## 2023-06-20 NOTE — Telephone Encounter (Signed)
New Message:     Patient said she went by ambulance to [facility] ER on 06-17-23(Tuesday) for chest pains. She had changes in her EKG and fluid in her lungs. She was told to contact her Cardiologist to see if her Echo needed to be moved up and if she needed to see her Cardiologist.. She said shed not called until today, because she had no energy and sleeping a lot.

## 2023-06-20 NOTE — Telephone Encounter (Signed)
Spoke with pt re recent ED visit and wanted to make sure did not need to have echo done earlier or have a f/u appt. Per pt is noting some fatigue no other symptoms Pt will continue to monitor Also pt is going to review the list of Card and believes she wants to f/u with Dr Jacques Navy  at [facility] location Pt believes sister sees Dr Jacques Navy  Will forward to Robin Searing NP for review and recommendations ./cy

## 2023-06-23 ENCOUNTER — Telehealth: Payer: Self-pay | Admitting: Cardiology

## 2023-06-23 MED ORDER — OMEPRAZOLE 40 MG PO CPDR: 40.0000 mg | DELAYED_RELEASE_CAPSULE | Freq: Every day | ORAL | 0 refills | Status: DC

## 2023-06-23 NOTE — Telephone Encounter (Signed)
Pt will continue monitoring for now. Asymptomatic. Advised to call office back if HRs 120 or greater, and/or symptoms begin. Aware that it may be time for another monitor to determine rhythm and then plan of care based on findings. But aware provider will determine at OV Friday. Patient verbalized understanding and agreeable to plan.

## 2023-06-23 NOTE — Telephone Encounter (Signed)
STAT if HR is under 50 or over 120 (normal HR is 60-100 beats per minute)  What is your heart rate? HR 103, BP 148/86  Do you have a log of your heart rate readings (document readings)? 99  Do you have any other symptoms? Patient states she is having no other symptoms.  She took a metoprolol at 11am this morning. She states her BP was 117/66 with a 67 hr after she took the medication.

## 2023-06-25 DIAGNOSIS — J3081 Allergic rhinitis due to animal (cat) (dog) hair and dander: Secondary | ICD-10-CM | POA: Diagnosis not present

## 2023-06-25 DIAGNOSIS — J301 Allergic rhinitis due to pollen: Secondary | ICD-10-CM | POA: Diagnosis not present

## 2023-06-25 DIAGNOSIS — J3089 Other allergic rhinitis: Secondary | ICD-10-CM | POA: Diagnosis not present

## 2023-06-27 ENCOUNTER — Encounter: Payer: Self-pay | Admitting: Physician Assistant

## 2023-06-27 ENCOUNTER — Ambulatory Visit: Payer: HMO | Attending: Physician Assistant | Admitting: Physician Assistant

## 2023-06-27 VITALS — BP 121/50 | HR 60 | Ht 61.0 in | Wt 167.8 lb

## 2023-06-27 DIAGNOSIS — R002 Palpitations: Secondary | ICD-10-CM

## 2023-06-27 DIAGNOSIS — I77810 Thoracic aortic ectasia: Secondary | ICD-10-CM

## 2023-06-27 DIAGNOSIS — I340 Nonrheumatic mitral (valve) insufficiency: Secondary | ICD-10-CM

## 2023-06-27 DIAGNOSIS — I10 Essential (primary) hypertension: Secondary | ICD-10-CM | POA: Diagnosis not present

## 2023-06-27 DIAGNOSIS — I071 Rheumatic tricuspid insufficiency: Secondary | ICD-10-CM | POA: Diagnosis not present

## 2023-06-27 DIAGNOSIS — E7800 Pure hypercholesterolemia, unspecified: Secondary | ICD-10-CM | POA: Diagnosis not present

## 2023-06-27 DIAGNOSIS — I77819 Aortic ectasia, unspecified site: Secondary | ICD-10-CM | POA: Diagnosis not present

## 2023-06-27 DIAGNOSIS — I350 Nonrheumatic aortic (valve) stenosis: Secondary | ICD-10-CM

## 2023-06-27 NOTE — Progress Notes (Signed)
Cardiology Office Note:  .   Date:  06/27/2023  ID:  DENESHIA HICE, DOB 1941/10/15, MRN [id number] PCP: [name], MD  [facility] Providers Cardiologist:  None {   History of Present Illness: .   Brianna Mccarty is a 82 y.o. 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  Previously followed by Dr. Delton See and then Dr. Shari Prows.  She had nuclear stress 2018 that was normal following complaint of significant fatigue.  2D echo was completed 06/2018 that showed LVEF 55 to 60% with grade 1 DD, mild aortic stenosis with AVA 1.5 cm, mean gradient 14 mmHg and severely dilated LA.  Patient presented to see Robin Searing, NP on 04/15/2023.  She did not have any cardiac complaints at that time.  Blood pressure was well-controlled.  Compliant with medications and denied any adverse reactions.  Walking on a treadmill at home and staying active.  Conscious of her diet and rarely eats red meat.  Denied chest pain, palpitations, dyspnea, PND, orthopnea, nausea, vomiting, dizziness, syncope, edema, weight gain or early satiety.  She has asthma which she was treating. Injections in both her knees, trouble walking, triggered her tachycardia/ breathing treatments were working Sun/Monday with tightness in her chest. Pressure in her chest and wheezing. EKG immediately and there were changes and called EMS to [facility].   Today, she tells me she is experienced some tachycardia (palpitations) and also notices her heart rate is in the low 50s a lot in the morning and at nighttime.  Asymptomatic from this.  She has 1 cup of caffeine in the morning and 1 cup of decaf coffee.  She does not drink further caffeinated beverages throughout the day.  Tries to stay hydrated and encouraged 64 ounces of water.  She was recently in the ED, records reviewed.  She was worried  about the finding on her chest x-ray which showed mild vascular congestion.  She is not having any shortness of breath or lower extremity edema.  Euvolemic on exam.  Tolerating medications without significant side effects.  Would not make any changes today.  Discussed changing her albuterol to Xopenex for better control of her tachycardia.  She will bring this up to her PCP at next visit.  Reports no shortness of breath nor dyspnea on exertion. Reports no chest pain, pressure, or tightness. No edema, orthopnea, PND.   ROS: Pertinent ROS in HPI  Studies Reviewed: .       Echocardiogram 08/22/2022 IMPRESSIONS     1. Left ventricular ejection fraction by 3D volume is 65 %. The left  ventricle has normal function. The left ventricle has no regional wall  motion abnormalities. The average left ventricular global longitudinal  strain is -24.7 %. The global longitudinal   strain is normal.   2. Right ventricular systolic function is normal. The right ventricular  size is normal. There is normal pulmonary artery systolic pressure.   3. Left atrial size was severely dilated.   4. The mitral valve is degenerative. Mild to moderate mitral valve  regurgitation. No evidence of mitral stenosis. Moderate mitral annular  calcification.   5. Tricuspid valve regurgitation is moderate.   6. The aortic valve is calcified. Aortic valve regurgitation is not  visualized. Mild aortic valve stenosis. Aortic valve area, by VTI measures  1.71 cm. Aortic valve mean gradient measures 18.4 mmHg. Aortic valve Vmax  measures 2.82 m/s.   7. There is mild dilatation of the ascending aorta, measuring 39 mm.   8. The inferior vena cava is normal in size with greater than 50%  respiratory variability, suggesting right atrial pressure of 3 mmHg.   Comparison(s): 04/23/21 EF 60-65%. Mild AS mean PG, peak PG.  Ascending aorta 40mm.   FINDINGS   Left Ventricle: Left ventricular ejection fraction by 3D  volume is 65 %.  The left ventricle has normal function. The left ventricle has no regional  wall motion abnormalities. The average left ventricular global  longitudinal strain is -24.7 %. The global   longitudinal strain is normal. The left ventricular internal cavity size  was normal in size. There is no left ventricular hypertrophy. Left  ventricular diastolic function could not be evaluated due to mitral  annular calcification (moderate or greater).   Right Ventricle: The right ventricular size is normal. No increase in  right ventricular wall thickness. Right ventricular systolic function is  normal. There is normal pulmonary artery systolic pressure. The tricuspid  regurgitant velocity is 2.57 m/s, and   with an assumed right atrial pressure of 3 mmHg, the estimated right  ventricular systolic pressure is 29.4 mmHg.   Left Atrium: Left atrial size was severely dilated.   Right Atrium: Right atrial size was normal in size.   Pericardium: There is no evidence of pericardial effusion.   Mitral Valve: The mitral valve is degenerative in appearance. Moderate  mitral annular calcification. Mild to moderate mitral valve regurgitation.  No evidence of mitral valve stenosis.   Tricuspid Valve: The tricuspid valve is normal in structure. Tricuspid  valve regurgitation is moderate . No evidence of tricuspid stenosis.   Aortic Valve: The aortic valve is calcified. Aortic valve regurgitation is  not visualized. Mild aortic stenosis is present. Aortic valve mean  gradient measures 18.4 mmHg. Aortic valve peak gradient measures 31.8  mmHg. Aortic valve area, by VTI measures  1.71 cm.   Pulmonic Valve: The pulmonic valve was not well visualized. Pulmonic valve  regurgitation is trivial. No evidence of pulmonic stenosis.   Aorta: There is mild dilatation of the ascending aorta, measuring 39 mm.   Venous: The inferior vena cava is normal in size with greater than 50%  respiratory  variability, suggesting right atrial pressure of 3 mmHg.   IAS/Shunts: No atrial level shunt detected by color flow Doppler.       Physical Exam:   VS:  BP (!) 121/50   Pulse 60   Ht 5\' 1"  (1.549 m)   Wt 167 lb 12.8 oz (76.1 kg)   SpO2 98%   BMI 31.71 kg/m    Wt Readings from Last 3 Encounters:  06/27/23 167 lb 12.8 oz (76.1 kg)  06/17/23 164 lb 14.5 oz (74.8 kg)  04/23/23 165 lb (74.8 kg)    GEN: Well nourished, well developed in no acute distress NECK: No JVD; No carotid bruits CARDIAC: RRR, no murmurs, rubs, gallops RESPIRATORY:  Clear to auscultation without rales, wheezing or rhonchi  ABDOMEN: Soft, non-tender, non-distended EXTREMITIES:  No edema; No deformity   ASSESSMENT AND PLAN: .   1.  Palpitations -not as often -Her baseline heart rate usually is in the 60s -Would continue current medication regimen which includes amlodipine 10 mg daily, Nexlizet 180-10 mg daily, Lasix 20 mg as needed, hydralazine 50 mg 3 times a day, losartan 100 mg daily, metoprolol succinate 25 mg daily  2.  Essential hypertension -well controlled  today -Continue current medication regimen -Continue heart healthy, low-sodium diet -Continue cardiovascular exercises most days of the week  3.  Pure hypercholesterolemia -Most recent labs show LDL 109, HDL 76, triglycerides 35, total cholesterol 192.  -She will be due for an updated lipid panel and follow-up with her primary care -Would recommend LDL less than 70 due to her carotid arteries and triglycerides less than 150 (at goal)  4.  Mild aortic stenosis -update echo -Asymptomatic at this time  5.  Dilation of the aorta -update echo  6. Steroid injection -She had plans to have this done, luckily any changes to her blood glucose or her blood pressure will be temporary      Dispo: Plan to follow-up with Dr. Jacques Navy in 6 months to establish care  Signed, Sharlene Dory, PA-C

## 2023-06-27 NOTE — Patient Instructions (Addendum)
Medication Instructions:  Your physician recommends that you continue on your current medications as directed. Please refer to the Current Medication list given to you today. *If you need a refill on your cardiac medications before your next appointment, please call your pharmacy*   Lab Work: COMPLETE A LIPID PANEL WITH YOUR PRIMARY CARE PROVIDER IN [place]  If you have labs (blood work) drawn today and your tests are completely normal, you will receive your results only by: MyChart Message (if you have MyChart) OR A paper copy in the mail If you have any lab test that is abnormal or we need to change your treatment, we will call you to review the results.   Testing/Procedures: Your physician has requested that you have an echocardiogram. Echocardiography is a painless test that uses sound waves to create images of your heart. It provides your doctor with information about the size and shape of your heart and how well your heart's chambers and valves are working. This procedure takes approximately one hour. There are no restrictions for this procedure. Please do NOT wear cologne, perfume, aftershave, or lotions (deodorant is allowed). Please arrive 15 minutes prior to your appointment time.   Follow-Up: At [facility], you and your health needs are our priority.  As part of our continuing mission to provide you with exceptional heart care, we have created designated Provider Care Teams.  These Care Teams include your primary Cardiologist (physician) and Advanced Practice Providers (APPs -  Physician Assistants and Nurse Practitioners) who all work together to provide you with the care you need, when you need it.  We recommend signing up for the patient portal called "MyChart".  Sign up information is provided on this After Visit Summary.  MyChart is used to connect with patients for Virtual Visits (Telemedicine).  Patients are able to view lab/test results, encounter notes, upcoming  appointments, etc.  Non-urgent messages can be sent to your provider as well.   To learn more about what you can do with MyChart, go to [email or web address].    Your next appointment:   6 month(s)  Provider:   Weston Brass, MD  Other Instructions

## 2023-06-30 DIAGNOSIS — M5416 Radiculopathy, lumbar region: Secondary | ICD-10-CM | POA: Diagnosis not present

## 2023-07-01 DIAGNOSIS — J3089 Other allergic rhinitis: Secondary | ICD-10-CM | POA: Diagnosis not present

## 2023-07-01 DIAGNOSIS — K219 Gastro-esophageal reflux disease without esophagitis: Secondary | ICD-10-CM | POA: Diagnosis not present

## 2023-07-01 DIAGNOSIS — J301 Allergic rhinitis due to pollen: Secondary | ICD-10-CM | POA: Diagnosis not present

## 2023-07-01 DIAGNOSIS — H1045 Other chronic allergic conjunctivitis: Secondary | ICD-10-CM | POA: Diagnosis not present

## 2023-07-01 DIAGNOSIS — J4540 Moderate persistent asthma, uncomplicated: Secondary | ICD-10-CM | POA: Diagnosis not present

## 2023-07-02 DIAGNOSIS — J3089 Other allergic rhinitis: Secondary | ICD-10-CM | POA: Diagnosis not present

## 2023-07-02 DIAGNOSIS — J3081 Allergic rhinitis due to animal (cat) (dog) hair and dander: Secondary | ICD-10-CM | POA: Diagnosis not present

## 2023-07-02 DIAGNOSIS — J301 Allergic rhinitis due to pollen: Secondary | ICD-10-CM | POA: Diagnosis not present

## 2023-07-15 DIAGNOSIS — J3081 Allergic rhinitis due to animal (cat) (dog) hair and dander: Secondary | ICD-10-CM | POA: Diagnosis not present

## 2023-07-15 DIAGNOSIS — J301 Allergic rhinitis due to pollen: Secondary | ICD-10-CM | POA: Diagnosis not present

## 2023-07-15 DIAGNOSIS — J3089 Other allergic rhinitis: Secondary | ICD-10-CM | POA: Diagnosis not present

## 2023-07-17 ENCOUNTER — Ambulatory Visit (HOSPITAL_COMMUNITY): Payer: HMO | Attending: Physician Assistant

## 2023-07-17 DIAGNOSIS — R002 Palpitations: Secondary | ICD-10-CM | POA: Diagnosis not present

## 2023-07-17 DIAGNOSIS — I10 Essential (primary) hypertension: Secondary | ICD-10-CM | POA: Insufficient documentation

## 2023-07-17 DIAGNOSIS — I77819 Aortic ectasia, unspecified site: Secondary | ICD-10-CM | POA: Insufficient documentation

## 2023-07-17 DIAGNOSIS — I77810 Thoracic aortic ectasia: Secondary | ICD-10-CM | POA: Diagnosis not present

## 2023-07-17 DIAGNOSIS — I350 Nonrheumatic aortic (valve) stenosis: Secondary | ICD-10-CM | POA: Insufficient documentation

## 2023-07-17 DIAGNOSIS — I071 Rheumatic tricuspid insufficiency: Secondary | ICD-10-CM | POA: Diagnosis not present

## 2023-07-17 DIAGNOSIS — E7800 Pure hypercholesterolemia, unspecified: Secondary | ICD-10-CM | POA: Insufficient documentation

## 2023-07-17 DIAGNOSIS — I340 Nonrheumatic mitral (valve) insufficiency: Secondary | ICD-10-CM | POA: Diagnosis not present

## 2023-07-20 LAB — ECHOCARDIOGRAM COMPLETE
AR max vel: 0.95 cm2
AV Area VTI: 0.88 cm2
AV Area mean vel: 0.96 cm2
AV Mean grad: 21 mmHg
AV Peak grad: 35 mmHg
Ao pk vel: 2.96 m/s
Area-P 1/2: 3.28 cm2
S' Lateral: 3 cm

## 2023-07-23 ENCOUNTER — Telehealth: Payer: Self-pay | Admitting: Physician Assistant

## 2023-07-23 DIAGNOSIS — I350 Nonrheumatic aortic (valve) stenosis: Secondary | ICD-10-CM

## 2023-07-23 NOTE — Telephone Encounter (Signed)
Discussed echo results with patient and recommendation to have repeat echo prior to appt with Dr. Jacques Navy in February 2025.   Echo ordered, scheduler to call patient to schedule appt.  Patient verbalized understanding and expressed appreciation for call.

## 2023-07-23 NOTE — Telephone Encounter (Signed)
-----   Message from Sharlene Dory sent at 07/23/2023  8:00 AM EDT ----- Can we order echo right before she sees Dr. Jacques Navy in Feb?  Thanks! Sharlene Dory, PA-C ----- Message ----- From: Parke Poisson, MD Sent: 07/21/2023   8:59 AM EDT To: Sharlene Dory, PA-C  No TEE yet. We are moving away from that as the first test of choice to confirm AS, often a AV cal score will do the trick but prob not needed yet.   I"ll see her, lets get that echo right before I see her. GA ----- Message ----- From: Bunnie Domino Sent: 07/21/2023   8:41 AM EDT To: Parke Poisson, MD; [address] Triage  Ms. Pooley,   Your heart pump function is normal. Mild to moderate leak of your mitral valve.  You had moderate calcification of your aortic valve and moderate narrowing of your aortic valve.  Would definitely recommend repeat echocardiogram in 6 months for further evaluation. Normal aortic measurements.  I will also message Dr. Jacques Navy to see if TEE would be indicated in this case with AVA 0.88.  If you have questions please let me know.   Sharlene Dory, PA-C

## 2023-07-24 DIAGNOSIS — M5416 Radiculopathy, lumbar region: Secondary | ICD-10-CM | POA: Diagnosis not present

## 2023-07-28 DIAGNOSIS — J301 Allergic rhinitis due to pollen: Secondary | ICD-10-CM | POA: Diagnosis not present

## 2023-07-28 DIAGNOSIS — J3081 Allergic rhinitis due to animal (cat) (dog) hair and dander: Secondary | ICD-10-CM | POA: Diagnosis not present

## 2023-07-28 DIAGNOSIS — J3089 Other allergic rhinitis: Secondary | ICD-10-CM | POA: Diagnosis not present

## 2023-07-29 ENCOUNTER — Other Ambulatory Visit (HOSPITAL_COMMUNITY): Payer: Self-pay

## 2023-08-05 ENCOUNTER — Encounter (INDEPENDENT_AMBULATORY_CARE_PROVIDER_SITE_OTHER): Payer: Self-pay | Admitting: Otolaryngology

## 2023-08-05 ENCOUNTER — Ambulatory Visit (INDEPENDENT_AMBULATORY_CARE_PROVIDER_SITE_OTHER): Payer: HMO | Admitting: Otolaryngology

## 2023-08-05 VITALS — Ht 60.0 in | Wt 165.0 lb

## 2023-08-05 DIAGNOSIS — H6123 Impacted cerumen, bilateral: Secondary | ICD-10-CM | POA: Diagnosis not present

## 2023-08-05 NOTE — Progress Notes (Signed)
Patient ID: Brianna Mccarty, female   DOB: 11-Mar-1941, 82 y.o.   MRN: [id number]  Recurrent cerumen impaction  Procedure: Bilateral recurrent cerumen disimpaction.    Indication: Cerumen impaction, resulting in ear discomfort and conductive hearing loss.    Description: The patient is placed supine on the operating table.  Under the operating microscope, the right ear canal is examined and is noted to be completely impacted with cerumen.  The cerumen is carefully removed with a combination of suction catheters, cerumen curette, and alligator forceps.  After the cerumen removal, the ear canal and tympanic membrane are noted to be normal.  No middle ear effusion is noted.  The same procedure is then repeated on the left side without exception. The patient tolerated the procedure well.  Follow-up care:  The patient is instructed not to use Q-tips to clean the ear canals.  The patient will follow up in 6 months.

## 2023-08-11 DIAGNOSIS — M170 Bilateral primary osteoarthritis of knee: Secondary | ICD-10-CM | POA: Diagnosis not present

## 2023-08-13 ENCOUNTER — Other Ambulatory Visit: Payer: Self-pay | Admitting: Family Medicine

## 2023-08-13 DIAGNOSIS — J3089 Other allergic rhinitis: Secondary | ICD-10-CM | POA: Diagnosis not present

## 2023-08-13 DIAGNOSIS — J3081 Allergic rhinitis due to animal (cat) (dog) hair and dander: Secondary | ICD-10-CM | POA: Diagnosis not present

## 2023-08-13 DIAGNOSIS — J301 Allergic rhinitis due to pollen: Secondary | ICD-10-CM | POA: Diagnosis not present

## 2023-08-14 DIAGNOSIS — H33313 Horseshoe tear of retina without detachment, bilateral: Secondary | ICD-10-CM | POA: Diagnosis not present

## 2023-08-14 DIAGNOSIS — H3554 Dystrophies primarily involving the retinal pigment epithelium: Secondary | ICD-10-CM | POA: Diagnosis not present

## 2023-08-14 DIAGNOSIS — H35373 Puckering of macula, bilateral: Secondary | ICD-10-CM | POA: Diagnosis not present

## 2023-08-14 DIAGNOSIS — H524 Presbyopia: Secondary | ICD-10-CM | POA: Diagnosis not present

## 2023-08-14 DIAGNOSIS — H40013 Open angle with borderline findings, low risk, bilateral: Secondary | ICD-10-CM | POA: Diagnosis not present

## 2023-08-14 DIAGNOSIS — H35341 Macular cyst, hole, or pseudohole, right eye: Secondary | ICD-10-CM | POA: Diagnosis not present

## 2023-08-18 DIAGNOSIS — M170 Bilateral primary osteoarthritis of knee: Secondary | ICD-10-CM | POA: Diagnosis not present

## 2023-08-18 MED ORDER — OMEPRAZOLE 40 MG PO CPDR: 40.0000 mg | DELAYED_RELEASE_CAPSULE | Freq: Every day | ORAL | 0 refills | Status: DC

## 2023-08-19 ENCOUNTER — Other Ambulatory Visit: Payer: Self-pay | Admitting: Family Medicine

## 2023-08-22 ENCOUNTER — Other Ambulatory Visit (HOSPITAL_COMMUNITY): Payer: PPO

## 2023-08-22 DIAGNOSIS — J3089 Other allergic rhinitis: Secondary | ICD-10-CM | POA: Diagnosis not present

## 2023-08-22 DIAGNOSIS — J301 Allergic rhinitis due to pollen: Secondary | ICD-10-CM | POA: Diagnosis not present

## 2023-08-22 DIAGNOSIS — J3081 Allergic rhinitis due to animal (cat) (dog) hair and dander: Secondary | ICD-10-CM | POA: Diagnosis not present

## 2023-08-25 DIAGNOSIS — M170 Bilateral primary osteoarthritis of knee: Secondary | ICD-10-CM | POA: Diagnosis not present

## 2023-09-05 ENCOUNTER — Other Ambulatory Visit: Payer: Self-pay

## 2023-09-05 MED ORDER — LOSARTAN POTASSIUM 100 MG PO TABS: 100.0000 mg | ORAL_TABLET | Freq: Every day | ORAL | 0 refills | Status: DC

## 2023-09-10 DIAGNOSIS — J3089 Other allergic rhinitis: Secondary | ICD-10-CM | POA: Diagnosis not present

## 2023-09-10 DIAGNOSIS — J3081 Allergic rhinitis due to animal (cat) (dog) hair and dander: Secondary | ICD-10-CM | POA: Diagnosis not present

## 2023-09-10 DIAGNOSIS — J301 Allergic rhinitis due to pollen: Secondary | ICD-10-CM | POA: Diagnosis not present

## 2023-09-18 DIAGNOSIS — M5451 Vertebrogenic low back pain: Secondary | ICD-10-CM | POA: Diagnosis not present

## 2023-09-19 DIAGNOSIS — J3089 Other allergic rhinitis: Secondary | ICD-10-CM | POA: Diagnosis not present

## 2023-09-19 DIAGNOSIS — J3081 Allergic rhinitis due to animal (cat) (dog) hair and dander: Secondary | ICD-10-CM | POA: Diagnosis not present

## 2023-09-19 DIAGNOSIS — J301 Allergic rhinitis due to pollen: Secondary | ICD-10-CM | POA: Diagnosis not present

## 2023-09-26 DIAGNOSIS — J3089 Other allergic rhinitis: Secondary | ICD-10-CM | POA: Diagnosis not present

## 2023-09-26 DIAGNOSIS — J301 Allergic rhinitis due to pollen: Secondary | ICD-10-CM | POA: Diagnosis not present

## 2023-09-26 DIAGNOSIS — J3081 Allergic rhinitis due to animal (cat) (dog) hair and dander: Secondary | ICD-10-CM | POA: Diagnosis not present

## 2023-09-30 DIAGNOSIS — J301 Allergic rhinitis due to pollen: Secondary | ICD-10-CM | POA: Diagnosis not present

## 2023-09-30 DIAGNOSIS — J3081 Allergic rhinitis due to animal (cat) (dog) hair and dander: Secondary | ICD-10-CM | POA: Diagnosis not present

## 2023-09-30 DIAGNOSIS — J3089 Other allergic rhinitis: Secondary | ICD-10-CM | POA: Diagnosis not present

## 2023-10-04 ENCOUNTER — Other Ambulatory Visit: Payer: Self-pay | Admitting: Family Medicine

## 2023-10-04 DIAGNOSIS — I10 Essential (primary) hypertension: Secondary | ICD-10-CM

## 2023-10-06 DIAGNOSIS — M25562 Pain in left knee: Secondary | ICD-10-CM | POA: Diagnosis not present

## 2023-10-06 DIAGNOSIS — J301 Allergic rhinitis due to pollen: Secondary | ICD-10-CM | POA: Diagnosis not present

## 2023-10-06 DIAGNOSIS — M25561 Pain in right knee: Secondary | ICD-10-CM | POA: Diagnosis not present

## 2023-10-06 DIAGNOSIS — J3081 Allergic rhinitis due to animal (cat) (dog) hair and dander: Secondary | ICD-10-CM | POA: Diagnosis not present

## 2023-10-06 DIAGNOSIS — J3089 Other allergic rhinitis: Secondary | ICD-10-CM | POA: Diagnosis not present

## 2023-10-09 DIAGNOSIS — J3089 Other allergic rhinitis: Secondary | ICD-10-CM | POA: Diagnosis not present

## 2023-10-09 DIAGNOSIS — J301 Allergic rhinitis due to pollen: Secondary | ICD-10-CM | POA: Diagnosis not present

## 2023-10-13 ENCOUNTER — Other Ambulatory Visit: Payer: Self-pay

## 2023-10-13 MED ORDER — NEXLIZET 180-10 MG PO TABS: 1.0000 | ORAL_TABLET | Freq: Every day | ORAL | 2 refills | Status: DC

## 2023-10-14 ENCOUNTER — Ambulatory Visit: Payer: HMO | Admitting: Family Medicine

## 2023-10-14 DIAGNOSIS — J3089 Other allergic rhinitis: Secondary | ICD-10-CM | POA: Diagnosis not present

## 2023-10-14 DIAGNOSIS — J301 Allergic rhinitis due to pollen: Secondary | ICD-10-CM | POA: Diagnosis not present

## 2023-10-14 DIAGNOSIS — J3081 Allergic rhinitis due to animal (cat) (dog) hair and dander: Secondary | ICD-10-CM | POA: Diagnosis not present

## 2023-10-22 ENCOUNTER — Ambulatory Visit
Admission: EM | Admit: 2023-10-22 | Discharge: 2023-10-22 | Disposition: A | Discharge status: Home / Self Care | Payer: HMO | Attending: Internal Medicine | Admitting: Internal Medicine

## 2023-10-22 ENCOUNTER — Encounter: Payer: Self-pay | Admitting: *Deleted

## 2023-10-22 ENCOUNTER — Other Ambulatory Visit: Payer: Self-pay

## 2023-10-22 DIAGNOSIS — B349 Viral infection, unspecified: Secondary | ICD-10-CM | POA: Diagnosis not present

## 2023-10-22 MED ORDER — BENZONATATE 100 MG PO CAPS: 100.0000 mg | ORAL_CAPSULE | Freq: Three times a day (TID) | ORAL | 0 refills | Status: DC

## 2023-10-22 NOTE — ED Provider Notes (Signed)
[name]    CSN: [id number] Arrival date & time: 10/22/23  1250      History   Chief Complaint Chief Complaint  Patient presents with   Cough    HPI Brianna Mccarty is a 82 y.o. female.    Cough scratchy throat, runny nose, and cough since yesterday.  Had scant yellow sputum this morning. Denies fever, chills, sweats, body aches, fatigue, earache, headache.  Denies recent travel.  Denies known contacts with illness.  Denies chest pain, shortness of breath, nausea, vomiting, diarrhea.  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 mild by carotid duplex.   Cervical dysplasia 1971   Coronary artery disease    Diverticulosis of colon    DJD (degenerative joint disease)    Family history of cancer of extrahepatic bile ducts 09/05/2020   Family history of colon cancer 09/05/2020   Family history of pancreatic cancer 09/05/2020   GERD (gastroesophageal reflux disease)    Headache(784.0)    History of kidney stones    Hypercholesterolemia    Hypertension    Interstitial cystitis    sees urologist   Lichen sclerosus    Malignant melanoma (HCC) 2006   sees Dr. Margo Aye in dermatology   Migraines    Mild aortic stenosis    Mitral valve disease    Question mitral valve prolapse in the past, no prolapse by echo 2009   Murmur 10/20/2015   SEES DR NELSON   OSA (obstructive sleep apnea)    USES DENTAL DEVICE   Osteoporosis    on fosomax > 5 years, stopped 11/2015   Pneumonia    SVT (supraventricular tachycardia) (HCC) 02/15/2022   Monitor 02/2022: NSR, avg HR 61; 2 runs of NSVT (4 beats); several runs of Supraventricular Tachycardia (longest 3"11'); no AFib   Thyroid cyst    1 x 1.1 thyroid cyst noted on carotid Doppler, January, 2012    Venous insufficiency     Patient Active Problem List   Diagnosis Date Noted   Impacted cerumen of both ears 08/05/2023   Acute diverticulitis 07/11/2022   SVT (supraventricular tachycardia) (HCC) 02/15/2022   Palpitations 01/15/2022   Mild aortic stenosis 01/15/2022   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   Genetic testing 09/13/2020   Family history of pancreatic cancer 09/05/2020   Family history of colon cancer 09/05/2020   Family history of cancer of extrahepatic bile ducts 09/05/2020   Fever 07/26/2020   History of COVID-19 07/26/2020   History of melanoma 05/12/2020   Macular degeneration 02/17/2019   OSA (obstructive sleep apnea) 02/12/2018   Prediabetes 02/12/2018   H/O cold sores 11/27/2015   Murmur 10/20/2015   Carotid artery disease (HCC)    Thyroid cyst    Venous (peripheral) insufficiency 06/02/2009   Asthma 04/27/2008   Melanoma of skin (HCC) 02/10/2008   Allergic rhinitis 02/10/2008   INTERSTITIAL CYSTITIS 02/10/2008   Pure hypercholesterolemia 02/09/2008   Essential hypertension 02/09/2008   GERD 02/09/2008   Osteoporosis 02/09/2008    Past Surgical History:  Procedure Laterality Date   BREAST BIOPSY Left 11/25/2011   U/S core, benign performed  at [facility], LEFT BREAT MARKER IN   BREAST CYST ASPIRATION     BREAST SURGERY  2013   Breast Bx-Benign   CARDIAC CATHETERIZATION     CATARACT EXTRACTION, BILATERAL     CHOLECYSTECTOMY N/A 12/22/2019   Procedure: LAPAROSCOPIC CHOLECYSTECTOMY;  Surgeon: Andria Meuse, MD;  Location: [facility];  Service: General;  Laterality: N/A;   cystoscopy and basket stone removal right ureter  02/2006   Dr. Dolphus Jenny SURGERY  12/22/2019   GYNECOLOGIC CRYOSURGERY  1971   left total hip replacement  2004   Dr. Darrelyn Hillock   melanoma removed from medial rleft knee area  2006   Dr. Margo Aye   NASAL SEPTUM  SURGERY     TOTAL HIP ARTHROPLASTY Right 08/01/2021   Procedure: TOTAL HIP ARTHROPLASTY ANTERIOR APPROACH;  Surgeon: Ollen Gross, MD;  Location: [facility];  Service: Orthopedics;  Laterality: Right;    OB History     Gravida  2   Para  2   Term  2   Preterm      AB      Living  2      SAB      IAB      Ectopic      Multiple      Live Births               Home Medications    Prior to Admission medications   Medication Sig Start Date End Date Taking? Authorizing Provider  acyclovir (ZOVIRAX) 400 MG tablet Take 1 tablet (400 mg total) by mouth 5 (five) times daily as needed (fever blisters). 02/21/22  Yes Wyline Beady A, NP  albuterol (PROAIR HFA) 108 (90 Base) MCG/ACT inhaler Inhale 2 puffs into the lungs every 6 (six) hours as needed for wheezing. 09/10/18  Yes Michele Mcalpine, MD  albuterol (PROVENTIL) (2.5 MG/3ML) 0.083% nebulizer solution Take 3 mLs (2.5 mg total) by nebulization every 6 (six) hours as needed. 09/10/18 07/09/29 Yes Michele Mcalpine, MD  alendronate (FOSAMAX) 70 MG tablet TAKE 1 TABLET EVERY 7 (SEVEN) DAYS. TAKE WITH A FULL GLASS OF WATER ON AN EMPTY STOMACH. 10/09/22  Yes [name], MD  amLODipine (NORVASC) 10 MG tablet TAKE 1 TABLET BY MOUTH EVERY DAY 10/06/23  Yes [name], MD  Ascorbic Acid (VITAMIN C PO) Take 1,000 mg by mouth daily.   Yes [provider]  azelastine (ASTELIN) 0.1 % nasal spray Instill 2 sprays into each nostril two times a day as needed for allergies 02/03/21  Yes Bing Neighbors, NP  Bempedoic Acid-Ezetimibe (NEXLIZET) 180-10 MG TABS Take 1 tablet by mouth daily. 10/13/23  Yes Conte, Tessa N, PA-C  budesonide-formoterol (SYMBICORT) 160-4.5 MCG/ACT inhaler Inhale 2 puffs into the lungs 2 (two) times daily. 10/05/21  Yes Wynn Banker, MD  ADVAIR [facility] 115-21 MCG/ACT inhaler Inhale 2 puffs into the lungs 2 (two) times daily.   Yes [provider]  betamethasone dipropionate 0.05 % cream Apply 1  application topically 2 (two) times daily as needed (scaly skin). Patient not taking: Reported on 10/22/2023 03/29/21   [provider]  CALCIUM PO Take 1,200 mg by mouth daily.   Yes [provider]  Cholecalciferol 125 MCG (5000 UT) TABS Take 1 tablet by mouth daily.   Yes [provider]  desmopressin (DDAVP) 0.2 MG tablet Take 0.2 mg by mouth at bedtime.   Yes [provider]  EPINEPHrine 0.3 mg/0.3 mL IJ  SOAJ injection Inject 0.3 mg into the muscle as needed for anaphylaxis. 12/21/18  Yes [provider]  ferrous sulfate 325 (65 FE) MG tablet TAKE 1 TABLET BY MOUTH TWICE A DAY WITH FOOD 03/27/23  Yes Meryl Dare, MD  fexofenadine (ALLEGRA) 180 MG tablet Take 180 mg by mouth daily.   Yes [provider]  furosemide (LASIX) 20 MG tablet TAKE 1 TABLET (20 MG TOTAL) BY MOUTH DAILY AS NEEDED FOR EDEMA. 05/15/23  Yes [name], MD  hydrALAZINE (APRESOLINE) 50 MG tablet Take 1 tablet (50 mg total) by mouth 3 (three) times daily. 04/15/23  Yes Gaston Islam., NP  hydrOXYzine (ATARAX/VISTARIL) 10 MG tablet Take 10 mg by mouth at bedtime.    Yes [provider]  losartan (COZAAR) 100 MG tablet Take 1 tablet (100 mg total) by mouth daily. 09/05/23  Yes [name], MD  MAGNESIUM PO Take 250 mg by mouth daily.   Yes [provider]  METAMUCIL FIBER PO Take 1 capsule by mouth once a week.   Yes [provider]  metoprolol succinate (TOPROL-XL) 25 MG 24 hr tablet TAKE 1 TABLET (25 MG TOTAL) BY MOUTH DAILY. 05/19/23  Yes Meriam Sprague, MD  montelukast (SINGULAIR) 10 MG tablet Take 10 mg by mouth at bedtime.   Yes [provider]  Multiple Vitamin (MULTIVITAMIN PO) Take 1 tablet by mouth daily.   Yes [provider]  omeprazole (PRILOSEC) 40 MG capsule Take 1 capsule (40 mg total) by mouth daily. 08/18/23  Yes [name], MD  OVER THE COUNTER MEDICATION Juice Plus-daily   Yes  [provider]  OVER THE COUNTER MEDICATION Nopolea 3 oz once a day   Yes [provider]  pentosan polysulfate (ELMIRON) 100 MG capsule Take 100 mg by mouth daily.   Yes [provider]  prazosin (MINIPRESS) 5 MG capsule TAKE 1 CAPSULE BY MOUTH 2 TIMES DAILY. 10/06/23  Yes [name], MD  Probiotic Product (PROBIOTIC PO) Take 1 capsule by mouth daily.    Yes [provider]    Family History Family History  Problem Relation Age of Onset   Pancreatic cancer Father 34   Diabetes Mother    Heart disease Mother    Cancer Brother 24       Bile duct   Diabetes Brother    Hypertension Brother    Diabetes Brother    Heart disease Brother    Hypertension Brother    Hypertension Sister    Hypertension Sister    Colon cancer Paternal Uncle        dx 63s   Lung cancer Paternal Uncle        dx 47s    Social History Social History   Tobacco Use   Smoking status: Never    Passive exposure: Never   Smokeless tobacco: Never  Vaping Use   Vaping status: Never Used  Substance Use Topics   Alcohol use: Yes    Alcohol/week: 1.0 standard drink of alcohol    Types: 1 Standard drinks or equivalent per week    Comment:  OCC WINE   Drug use: No     Allergies   Celebrex [celecoxib], Cephalexin, Hydrochlorothiazide, Penicillin g benzathine, Penicillins, Phenobarbital, Statins, Passion fruit flavoring agent (non-screening), and Tape   Review of Systems Review of Systems  Respiratory:  Positive for cough.      Physical Exam Triage Vital Signs ED Triage Vitals  Encounter Vitals  Group     BP 10/22/23 1404 (!) 114/57     Systolic BP Percentile --      Diastolic BP Percentile --      Pulse Rate 10/22/23 1404 (!) 58     Resp 10/22/23 1404 18     Temp --      Temp Source 10/22/23 1404 Oral     SpO2 10/22/23 1404 97 %     Weight --      Height --      Head Circumference --      Peak Flow --      Pain Score 10/22/23 1359 0     Pain Loc  --      Pain Education --      Exclude from Growth Chart --    No data found.  Updated Vital Signs BP (!) 114/57 (BP Location: Right Arm)   Pulse (!) 58   Resp 18   SpO2 97%   Visual Acuity Right Eye Distance:   Left Eye Distance:   Bilateral Distance:    Right Eye Near:   Left Eye Near:    Bilateral Near:     Physical Exam Vitals and nursing note reviewed.  HENT:     Head: Atraumatic.     Right Ear: Tympanic membrane and ear canal normal.     Left Ear: Tympanic membrane and ear canal normal.     Nose: No rhinorrhea.     Mouth/Throat:     Mouth: Mucous membranes are moist.     Pharynx: No oropharyngeal exudate or posterior oropharyngeal erythema.  Eyes:     Conjunctiva/sclera: Conjunctivae normal.  Cardiovascular:     Rate and Rhythm: Normal rate and regular rhythm.     Heart sounds: Normal heart sounds.  Pulmonary:     Effort: Pulmonary effort is normal. No respiratory distress.     Breath sounds: Normal breath sounds. No wheezing, rhonchi or rales.  Musculoskeletal:     Cervical back: Neck supple.  Lymphadenopathy:     Cervical: No cervical adenopathy.  Neurological:     Mental Status: She is alert.  Psychiatric:        Mood and Affect: Mood normal.      UC Treatments / Results  Labs (all labs ordered are listed, but only abnormal results are displayed) Labs Reviewed - No data to display  EKG   Radiology No results found.  Procedures Procedures (including critical care time)  Medications Ordered in UC Medications - No data to display  Initial Impression / Assessment and Plan / UC Course  I have reviewed the triage vital signs and the nursing notes.  Pertinent labs & imaging results that were available during my care of the patient were reviewed by me and considered in my medical decision making (see chart for details).     82 year old female with scratchy throat cough since yesterday,'s reports scant yellow sputum this morning.  Denies  fever or chills, vital signs are stable.  Lungs clear to auscultation, no evidence of bacterial infection.  Discussed with patient likely viral illness will Rx Tessalon for cough, home care warning signs and follow-up reviewed with patient. Final Clinical Impressions(s) / UC Diagnoses   Final diagnoses:  None   Discharge Instructions   None    ED Prescriptions   None    PDMP not reviewed this encounter.   Meliton Rattan, [place] 10/22/23 1414

## 2023-10-22 NOTE — ED Triage Notes (Signed)
Pt reports cough since yesterday with chest congestion and runny nose. Denies known fever

## 2023-10-23 ENCOUNTER — Ambulatory Visit: Payer: Self-pay

## 2023-10-30 ENCOUNTER — Other Ambulatory Visit: Payer: Self-pay | Admitting: Nurse Practitioner

## 2023-10-30 DIAGNOSIS — B009 Herpesviral infection, unspecified: Secondary | ICD-10-CM

## 2023-10-30 NOTE — Telephone Encounter (Signed)
Med refill request: acyclovir 400 mg Last AEX: 03/04/23 TW Next AEX: none scheduled Last MMG (if hormonal med) n/a Refill authorized: acyclovir 400 mg #30.  Sent to provider for review.

## 2023-11-03 DIAGNOSIS — J301 Allergic rhinitis due to pollen: Secondary | ICD-10-CM | POA: Diagnosis not present

## 2023-11-03 DIAGNOSIS — J3089 Other allergic rhinitis: Secondary | ICD-10-CM | POA: Diagnosis not present

## 2023-11-10 DIAGNOSIS — J3081 Allergic rhinitis due to animal (cat) (dog) hair and dander: Secondary | ICD-10-CM | POA: Diagnosis not present

## 2023-11-10 DIAGNOSIS — J3089 Other allergic rhinitis: Secondary | ICD-10-CM | POA: Diagnosis not present

## 2023-11-10 DIAGNOSIS — J301 Allergic rhinitis due to pollen: Secondary | ICD-10-CM | POA: Diagnosis not present

## 2023-11-11 ENCOUNTER — Telehealth: Payer: Self-pay | Admitting: *Deleted

## 2023-11-11 ENCOUNTER — Encounter: Payer: Self-pay | Admitting: Family Medicine

## 2023-11-11 ENCOUNTER — Ambulatory Visit: Payer: HMO | Admitting: Family Medicine

## 2023-11-11 VITALS — BP 112/60 | HR 50 | Temp 98.0°F | Ht 60.0 in | Wt 162.5 lb

## 2023-11-11 DIAGNOSIS — M810 Age-related osteoporosis without current pathological fracture: Secondary | ICD-10-CM | POA: Diagnosis not present

## 2023-11-11 DIAGNOSIS — K219 Gastro-esophageal reflux disease without esophagitis: Secondary | ICD-10-CM | POA: Diagnosis not present

## 2023-11-11 DIAGNOSIS — R350 Frequency of micturition: Secondary | ICD-10-CM | POA: Diagnosis not present

## 2023-11-11 DIAGNOSIS — I10 Essential (primary) hypertension: Secondary | ICD-10-CM | POA: Diagnosis not present

## 2023-11-11 DIAGNOSIS — J4530 Mild persistent asthma, uncomplicated: Secondary | ICD-10-CM

## 2023-11-11 DIAGNOSIS — E7800 Pure hypercholesterolemia, unspecified: Secondary | ICD-10-CM | POA: Diagnosis not present

## 2023-11-11 MED ORDER — OMEPRAZOLE 40 MG PO CPDR: 40.0000 mg | DELAYED_RELEASE_CAPSULE | Freq: Every day | ORAL | 1 refills | Status: DC

## 2023-11-11 MED ORDER — AMLODIPINE BESYLATE 10 MG PO TABS: 10.0000 mg | ORAL_TABLET | Freq: Every day | ORAL | 1 refills | Status: DC

## 2023-11-11 MED ORDER — FUROSEMIDE 20 MG PO TABS: 20.0000 mg | ORAL_TABLET | Freq: Every day | ORAL | 1 refills | Status: AC | PRN

## 2023-11-11 MED ORDER — METHYLPREDNISOLONE 4 MG PO TBPK: ORAL_TABLET | ORAL | 0 refills | Status: DC

## 2023-11-11 MED ORDER — ALENDRONATE SODIUM 70 MG PO TABS: ORAL_TABLET | ORAL | 3 refills | Status: AC

## 2023-11-11 NOTE — Progress Notes (Signed)
 Established Patient Office Visit  Subjective   Patient ID: Brianna Mccarty, female    DOB: [birth year]  Age: 83 y.o. MRN: [id number]  Chief Complaint  Patient presents with   Cough    Productive with clear sputum x2 weeks   Follow-up    Patient presents for ER follow up, states she was told there was congestion around her lungs/blood vessels and advised to follow up with PCP   Toe Pain    Patient complains of a corn or callous between the right great-second toe x2 weeks    Pt is here for follow up today and has new issues  Cough-- pt states she went to UC for treatment and was told she had a viral bronchitis. States she was given benzonatate  but they were not very effective. She reports her symptoms have persisted since 12/18, states that the sputum is getting more clear, no fever or chills, states that she is feeling some better, some sx have improved.   Right toe pain-- pt reports that for the past 2 weeks she has a spot on her right second toe. States that her toes are rubbing together in the middle and it is  getting more sore/painful.  HTN -- BP in office performed and is well controlled. She  reports no side effects to the medications, no chest pain, SOB, dizziness or headaches. She has a BP cuff at home and is checking BP regularly, reports they are in the normal range.        Current Outpatient Medications  Medication Instructions   acyclovir  (ZOVIRAX ) 400 mg, Oral, 5 times daily PRN   ADVAIR HFA 115-21 MCG/ACT inhaler 2 puffs, 2 times daily   albuterol  (PROAIR  HFA) 108 (90 Base) MCG/ACT inhaler 2 puffs, Inhalation, Every 6 hours PRN   albuterol  (PROVENTIL ) 2.5 mg, Nebulization, Every 6 hours PRN   alendronate  (FOSAMAX ) 70 MG tablet TAKE 1 TABLET EVERY 7 (SEVEN) DAYS. TAKE WITH A FULL GLASS OF WATER  ON AN EMPTY STOMACH.   amLODipine  (NORVASC ) 10 mg, Oral, Daily   Ascorbic Acid (VITAMIN C PO) 1,000 mg, Daily   azelastine  (ASTELIN ) 0.1 % nasal spray Instill 2 sprays  into each nostril two times a day as needed for allergies   Bempedoic Acid -Ezetimibe  (NEXLIZET ) 180-10 MG TABS 1 tablet, Oral, Daily   budesonide -formoterol  (SYMBICORT ) 160-4.5 MCG/ACT inhaler 2 puffs, Inhalation, 2 times daily   CALCIUM  PO 1,200 mg, Daily   Cholecalciferol 125 MCG (5000 UT) TABS 1 tablet, Daily   desmopressin  (DDAVP ) 0.2 mg, Daily at bedtime   EPINEPHrine  (EPI-PEN) 0.3 mg, As needed   ferrous sulfate  325 mg, Oral, 2 times daily with meals   fexofenadine (ALLEGRA) 180 mg, Daily   furosemide  (LASIX ) 20 mg, Oral, Daily PRN   hydrOXYzine  (ATARAX ) 10 mg, Daily at bedtime   losartan  (COZAAR ) 100 mg, Oral, Daily   MAGNESIUM  PO 250 mg, Daily   METAMUCIL FIBER PO 1 capsule, Weekly   methylPREDNISolone  (MEDROL  DOSEPAK) 4 MG TBPK tablet Take package as directed   metoprolol  succinate (TOPROL -XL) 25 mg, Oral, Daily   montelukast  (SINGULAIR ) 10 mg, Daily at bedtime   Multiple Vitamin (MULTIVITAMIN PO) 1 tablet, Daily   omeprazole  (PRILOSEC) 40 mg, Oral, Daily   OVER THE COUNTER MEDICATION Juice Plus-daily   OVER THE COUNTER MEDICATION [place] 3 oz once a day   pentosan polysulfate (ELMIRON ) 100 mg, Daily   prazosin  (MINIPRESS ) 5 mg, Oral, 2 times daily   Probiotic Product (PROBIOTIC PO) 1 capsule, Daily  Patient Active Problem List   Diagnosis Date Noted   Impacted cerumen of both ears 08/05/2023   Acute diverticulitis 07/11/2022   SVT (supraventricular tachycardia) (HCC) 02/15/2022   Palpitations 01/15/2022   Mild aortic stenosis 01/15/2022   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   Genetic testing 09/13/2020   Family history of pancreatic cancer 09/05/2020   Family history of colon cancer 09/05/2020   Family history of cancer of extrahepatic bile ducts 09/05/2020   Fever 07/26/2020   History of COVID-19 07/26/2020   History of melanoma 05/12/2020   Macular degeneration  02/17/2019   OSA (obstructive sleep apnea) 02/12/2018   Prediabetes 02/12/2018   H/O cold sores 11/27/2015   Murmur 10/20/2015   Carotid artery disease (HCC)    Thyroid  cyst    Venous (peripheral) insufficiency 06/02/2009   Asthma 04/27/2008   Melanoma of skin (HCC) 02/10/2008   Allergic rhinitis 02/10/2008   INTERSTITIAL CYSTITIS 02/10/2008   Pure hypercholesterolemia 02/09/2008   Essential hypertension 02/09/2008   GERD 02/09/2008   Osteoporosis 02/09/2008      Review of Systems  All other systems reviewed and are negative.     Objective:     BP 112/60   Pulse (!) 50   Temp 98 F (36.7 C) (Oral)   Ht 5' (1.524 m)   Wt 162 lb 8 oz (73.7 kg)   SpO2 99%   BMI 31.74 kg/m    Physical Exam Vitals reviewed.  Constitutional:      Appearance: Normal appearance. She is well-groomed and normal weight.  Eyes:     Conjunctiva/sclera: Conjunctivae normal.  Cardiovascular:     Rate and Rhythm: Normal rate and regular rhythm.     Pulses: Normal pulses.     Heart sounds: S1 normal and S2 normal.  Pulmonary:     Effort: Pulmonary effort is normal.     Breath sounds: Normal breath sounds and air entry.  Musculoskeletal:     Right lower leg: No edema.     Left lower leg: No edema.  Neurological:     Mental Status: She is alert and oriented to person, place, and time. Mental status is at baseline.     Gait: Gait is intact.  Psychiatric:        Mood and Affect: Mood and affect normal.        Speech: Speech normal.        Behavior: Behavior normal.        Judgment: Judgment normal.      The ASCVD Risk score (Arnett DK, et al., 2019) failed to calculate for the following reasons:   The 2019 ASCVD risk score is only valid for ages 19 to 37    Assessment & Plan:  Osteoporosis, unspecified osteoporosis type, unspecified pathological fracture presence Assessment & Plan: Doing well on fosamax  70 mg weekly, will continue this medication as prescribed.   Orders: -      Alendronate  Sodium; TAKE 1 TABLET EVERY 7 (SEVEN) DAYS. TAKE WITH A FULL GLASS OF WATER  ON AN EMPTY STOMACH.  Dispense: 12 tablet; Refill: 3  Essential hypertension Assessment & Plan: Current hypertension medications:       Sig   losartan  (COZAAR ) 100 MG tablet (Taking) Take 1 tablet (100 mg total) by mouth daily.   metoprolol  succinate (TOPROL -XL) 25 MG 24 hr tablet (Taking) TAKE 1 TABLET (25 MG TOTAL) BY MOUTH DAILY.   prazosin  (MINIPRESS )  5 MG capsule (Taking) TAKE 1 CAPSULE BY MOUTH 2 TIMES DAILY.   amLODipine  (NORVASC ) 10 MG tablet Take 1 tablet (10 mg total) by mouth daily.   furosemide  (LASIX ) 20 MG tablet Take 1 tablet (20 mg total) by mouth daily as needed for edema.      Chronic, stable, well controlled, continue the above medications as prescribed, checking new CMP  Orders: -     amLODIPine  Besylate; Take 1 tablet (10 mg total) by mouth daily.  Dispense: 90 tablet; Refill: 1 -     Furosemide ; Take 1 tablet (20 mg total) by mouth daily as needed for edema.  Dispense: 90 tablet; Refill: 1 -     Comprehensive metabolic panel; Future  Gastroesophageal reflux disease without esophagitis Assessment & Plan: On PPI, chronic and stable, she needs refills of her omeprazole  today. Orders placed.  Orders: -     Omeprazole ; Take 1 capsule (40 mg total) by mouth daily.  Dispense: 90 capsule; Refill: 1  Pure hypercholesterolemia Assessment & Plan: Pt has statin myopathy, is getting surveillance yearly with lipid testing, orders placed.   Orders: -     Lipid panel; Future  Mild persistent asthma without complication Assessment & Plan: Has had a prolonged URI but seems to be slowly improving, lung exam is benign, however she continues to have persistent coughing and symptoms, will treat with steroid taper, she will continue her home inhalers as well, do not think abx are needed at this time.   Orders: -     methylPREDNISolone ; Take package as directed  Dispense: 21 each; Refill:  0  Urinary frequency -     Urinalysis, Routine w reflex microscopic     Return in about 6 months (around 05/10/2024) for annual physical exam.    Heron CHRISTELLA Sharper, MD

## 2023-11-11 NOTE — Telephone Encounter (Signed)
 Patient came in today for a visit with PCP and stated she was receiving assistance for Nexletol  (unsure of spelling or name as Rx is not on the med list) as she is intolerant to statins.  Stated the pharmacist told her the Rx would cost over $100 and she cannot afford this.  Message sent to [facility] for assistance.

## 2023-11-11 NOTE — Telephone Encounter (Signed)
 Spoke with the patient and informed her of the message below.

## 2023-11-11 NOTE — Telephone Encounter (Signed)
 FYI: Renewed patient's [employer] that makes the Nexlizet no charge for the patient at her pharmacy. Tried to contact patient, had to leave a voicemail. Also sent a MyChart message.  Billing Info Listed Below for Reference:

## 2023-11-12 LAB — URINALYSIS, ROUTINE W REFLEX MICROSCOPIC
Bilirubin Urine: NEGATIVE
Hgb urine dipstick: NEGATIVE
Ketones, ur: NEGATIVE
Leukocytes,Ua: NEGATIVE
Nitrite: NEGATIVE
RBC / HPF: NONE SEEN (ref 0–?)
Specific Gravity, Urine: 1.015 (ref 1.000–1.030)
Total Protein, Urine: NEGATIVE
Urine Glucose: NEGATIVE
Urobilinogen, UA: 0.2 (ref 0.0–1.0)
WBC, UA: NONE SEEN (ref 0–?)
pH: 6 (ref 5.0–8.0)

## 2023-11-12 LAB — LIPID PANEL
Cholesterol: 251 mg/dL — ABNORMAL HIGH (ref 0–200)
HDL: 68.8 mg/dL (ref >39.00)
LDL Cholesterol: 166 mg/dL — ABNORMAL HIGH (ref 0–99)
NonHDL: 182.23
Total CHOL/HDL Ratio: 4
Triglycerides: 81 mg/dL (ref 0.0–149.0)
VLDL: 16.2 mg/dL (ref 0.0–40.0)

## 2023-11-12 LAB — COMPREHENSIVE METABOLIC PANEL
ALT: 12 U/L (ref 0–35)
AST: 19 U/L (ref 0–37)
Albumin: 4.1 g/dL (ref 3.5–5.2)
Alkaline Phosphatase: 52 U/L (ref 39–117)
BUN: 18 mg/dL (ref 6–23)
CO2: 27 meq/L (ref 19–32)
Calcium: 9.6 mg/dL (ref 8.4–10.5)
Chloride: 105 meq/L (ref 96–112)
Creatinine, Ser: 0.69 mg/dL (ref 0.40–1.20)
GFR: 79.94 mL/min (ref >60.00)
Glucose, Bld: 124 mg/dL — ABNORMAL HIGH (ref 70–99)
Potassium: 4 meq/L (ref 3.5–5.1)
Sodium: 139 meq/L (ref 135–145)
Total Bilirubin: 0.5 mg/dL (ref 0.2–1.2)
Total Protein: 6.2 g/dL (ref 6.0–8.3)

## 2023-11-13 ENCOUNTER — Encounter: Payer: Self-pay | Admitting: Family Medicine

## 2023-11-13 DIAGNOSIS — J4541 Moderate persistent asthma with (acute) exacerbation: Secondary | ICD-10-CM

## 2023-11-13 NOTE — Assessment & Plan Note (Signed)
 Doing well on fosamax 70 mg weekly, will continue this medication as prescribed.

## 2023-11-13 NOTE — Assessment & Plan Note (Signed)
 Has had a prolonged URI but seems to be slowly improving, lung exam is benign, however she continues to have persistent coughing and symptoms, will treat with steroid taper, she will continue her home inhalers as well, do not think abx are needed at this time.

## 2023-11-13 NOTE — Assessment & Plan Note (Signed)
 Pt has statin myopathy, is getting surveillance yearly with lipid testing, orders placed.

## 2023-11-13 NOTE — Assessment & Plan Note (Signed)
 Current hypertension medications:       Sig   losartan  (COZAAR ) 100 MG tablet (Taking) Take 1 tablet (100 mg total) by mouth daily.   metoprolol  succinate (TOPROL -XL) 25 MG 24 hr tablet (Taking) TAKE 1 TABLET (25 MG TOTAL) BY MOUTH DAILY.   prazosin  (MINIPRESS ) 5 MG capsule (Taking) TAKE 1 CAPSULE BY MOUTH 2 TIMES DAILY.   amLODipine  (NORVASC ) 10 MG tablet Take 1 tablet (10 mg total) by mouth daily.   furosemide  (LASIX ) 20 MG tablet Take 1 tablet (20 mg total) by mouth daily as needed for edema.      Chronic, stable, well controlled, continue the above medications as prescribed, checking new CMP

## 2023-11-13 NOTE — Assessment & Plan Note (Signed)
 On PPI, chronic and stable, she needs refills of her omeprazole today. Orders placed.

## 2023-11-17 ENCOUNTER — Telehealth: Payer: Self-pay

## 2023-11-17 MED ORDER — AZITHROMYCIN 250 MG PO TABS: ORAL_TABLET | ORAL | 0 refills | Status: AC

## 2023-11-17 NOTE — Telephone Encounter (Signed)
 Answered in my chart, ok to close

## 2023-11-17 NOTE — Telephone Encounter (Signed)
 Copied from CRM [id number]. Topic: Clinical - Medication Question >> Nov 17, 2023  8:28 AM Antonio DEL wrote: Reason for CRM: Patient was seen last week for URI, was prescribed prednisone  but is worried after reading about some of the side effects because she has tachycardia as well as asthma. She is still having the terrible cough and now yellow mucus is producing out of her nose, she sent MyChart message on Thursday. she is wanting to know if the prednisone  is okay for her to take. She would like a call back. Also would like to know if cough medicine can be called in for her.

## 2023-11-17 NOTE — Telephone Encounter (Signed)
 Noted.

## 2023-11-21 DIAGNOSIS — J3089 Other allergic rhinitis: Secondary | ICD-10-CM | POA: Diagnosis not present

## 2023-11-21 DIAGNOSIS — J301 Allergic rhinitis due to pollen: Secondary | ICD-10-CM | POA: Diagnosis not present

## 2023-11-21 DIAGNOSIS — J3081 Allergic rhinitis due to animal (cat) (dog) hair and dander: Secondary | ICD-10-CM | POA: Diagnosis not present

## 2023-11-28 DIAGNOSIS — J3089 Other allergic rhinitis: Secondary | ICD-10-CM | POA: Diagnosis not present

## 2023-11-28 DIAGNOSIS — J3081 Allergic rhinitis due to animal (cat) (dog) hair and dander: Secondary | ICD-10-CM | POA: Diagnosis not present

## 2023-11-28 DIAGNOSIS — J301 Allergic rhinitis due to pollen: Secondary | ICD-10-CM | POA: Diagnosis not present

## 2023-12-03 DIAGNOSIS — J3081 Allergic rhinitis due to animal (cat) (dog) hair and dander: Secondary | ICD-10-CM | POA: Diagnosis not present

## 2023-12-03 DIAGNOSIS — J301 Allergic rhinitis due to pollen: Secondary | ICD-10-CM | POA: Diagnosis not present

## 2023-12-03 DIAGNOSIS — J3089 Other allergic rhinitis: Secondary | ICD-10-CM | POA: Diagnosis not present

## 2023-12-10 DIAGNOSIS — J3089 Other allergic rhinitis: Secondary | ICD-10-CM | POA: Diagnosis not present

## 2023-12-10 DIAGNOSIS — J3081 Allergic rhinitis due to animal (cat) (dog) hair and dander: Secondary | ICD-10-CM | POA: Diagnosis not present

## 2023-12-10 DIAGNOSIS — J301 Allergic rhinitis due to pollen: Secondary | ICD-10-CM | POA: Diagnosis not present

## 2023-12-15 ENCOUNTER — Ambulatory Visit (HOSPITAL_COMMUNITY): Payer: HMO | Attending: Physician Assistant

## 2023-12-15 DIAGNOSIS — I350 Nonrheumatic aortic (valve) stenosis: Secondary | ICD-10-CM | POA: Diagnosis not present

## 2023-12-15 LAB — ECHOCARDIOGRAM COMPLETE
AR max vel: 1.17 cm2
AV Area VTI: 1 cm2
AV Area mean vel: 0.97 cm2
AV Mean grad: 20.8 mm[Hg]
AV Peak grad: 36.8 mm[Hg]
Ao pk vel: 3.03 m/s
Area-P 1/2: 4.21 cm2
MV M vel: 5.3 m/s
MV Peak grad: 112.4 mm[Hg]
Radius: 0.7 cm
S' Lateral: 3.1 cm

## 2023-12-17 ENCOUNTER — Encounter: Payer: Self-pay | Admitting: Physician Assistant

## 2023-12-19 DIAGNOSIS — J3089 Other allergic rhinitis: Secondary | ICD-10-CM | POA: Diagnosis not present

## 2023-12-19 DIAGNOSIS — J301 Allergic rhinitis due to pollen: Secondary | ICD-10-CM | POA: Diagnosis not present

## 2023-12-26 DIAGNOSIS — J3081 Allergic rhinitis due to animal (cat) (dog) hair and dander: Secondary | ICD-10-CM | POA: Diagnosis not present

## 2023-12-26 DIAGNOSIS — J3089 Other allergic rhinitis: Secondary | ICD-10-CM | POA: Diagnosis not present

## 2023-12-26 DIAGNOSIS — J301 Allergic rhinitis due to pollen: Secondary | ICD-10-CM | POA: Diagnosis not present

## 2023-12-29 ENCOUNTER — Encounter: Payer: Self-pay | Admitting: *Deleted

## 2023-12-29 ENCOUNTER — Ambulatory Visit: Payer: HMO | Attending: Internal Medicine | Admitting: Internal Medicine

## 2023-12-29 VITALS — BP 134/60 | HR 50 | Ht 61.0 in | Wt 163.2 lb

## 2023-12-29 DIAGNOSIS — R002 Palpitations: Secondary | ICD-10-CM | POA: Diagnosis not present

## 2023-12-29 DIAGNOSIS — I350 Nonrheumatic aortic (valve) stenosis: Secondary | ICD-10-CM

## 2023-12-29 DIAGNOSIS — R001 Bradycardia, unspecified: Secondary | ICD-10-CM | POA: Diagnosis not present

## 2023-12-29 DIAGNOSIS — I10 Essential (primary) hypertension: Secondary | ICD-10-CM | POA: Diagnosis not present

## 2023-12-29 DIAGNOSIS — E785 Hyperlipidemia, unspecified: Secondary | ICD-10-CM | POA: Diagnosis not present

## 2023-12-29 DIAGNOSIS — I340 Nonrheumatic mitral (valve) insufficiency: Secondary | ICD-10-CM | POA: Diagnosis not present

## 2023-12-29 DIAGNOSIS — I471 Supraventricular tachycardia, unspecified: Secondary | ICD-10-CM

## 2023-12-29 NOTE — Addendum Note (Signed)
 Addended by: Morrell Riddle on: 12/29/2023 03:24 PM   Modules accepted: Orders

## 2023-12-29 NOTE — Progress Notes (Unsigned)
Patient enrolled for [employer]/ [employer] to ship a 30 day cardiac event monitor to her address on file. 

## 2023-12-29 NOTE — Patient Instructions (Addendum)
 Medication Instructions:  No changes *If you need a refill on your cardiac medications before your next appointment, please call your pharmacy*  Lab Work: None  Testing/Procedures: Your physician has requested that you have an echocardiogram in about one year, will be done in February 2026. Echocardiography is a painless test that uses sound waves to create images of your heart. It provides your doctor with information about the size and shape of your heart and how well your heart's chambers and valves are working. This procedure takes approximately one hour. There are no restrictions for this procedure. Please do NOT wear cologne, perfume, aftershave, or lotions (deodorant is allowed). Please arrive 15 minutes prior to your appointment time. This will take place at [address]   [employer] Cardiac Event Monitor Instructions  Your physician has requested you wear your cardiac event monitor for 30 days. [employer] may call or text to confirm a shipping address. The monitor will be sent to a land address via UPS. [employer] will not ship a monitor to a PO BOX. It typically takes 3-5 days to receive your monitor after it has been enrolled. [employer] will assist with USPS tracking if your package is delayed. The telephone number for [employer] is [phone]. Once you have received your monitor, please review the enclosed instructions. Instruction tutorials can also be viewed under help and settings on the enclosed cell phone. Your monitor has already been registered assigning a specific monitor serial # to you.  Billing and Self Pay Discount Information  [employer] has been provided the insurance information we had on file for you.  If your insurance has been updated, please call [employer] at [phone] to provide them with your updated insurance information.   [employer] offers a discounted Self Pay option for patients who have insurance that does not cover their cardiac  event monitor or patients without insurance.  The discounted cost of a Self Pay Cardiac Event Monitor would be $225.00 , if the patient contacts [employer] at [phone] within 7 days of applying the monitor to make payment arrangements.  If the patient does not contact [employer] within 7 days of applying the monitor, the cost of the cardiac event monitor will be $350.00.  Applying the monitor  Remove cell phone from case and turn it on. The cell phone works as IT consultant and needs to be within [employer] of you at all times. The cell phone will need to be charged on a daily basis. We recommend you plug the cell phone into the enclosed charger at your bedside table every night.  Monitor batteries: You will receive two monitor batteries labelled #1 and #2. These are your recorders. Plug battery #2 onto the second connection on the enclosed charger. Keep one battery on the charger at all times. This will keep the monitor battery deactivated. It will also keep it fully charged for when you need to switch your monitor batteries. A small light will be blinking on the battery emblem when it is charging. The light on the battery emblem will remain on when the battery is fully charged.  Open package of a Monitor strip. Insert battery #1 into black hood on strip and gently squeeze monitor battery onto connection as indicated in instruction booklet. Set aside while preparing skin.  Choose location for your strip, vertical or horizontal, as indicated in the instruction booklet. Shave to remove all hair from location. There cannot be any lotions, oils, powders, or colognes on skin where monitor is  to be applied. Wipe skin clean with enclosed Saline wipe. Dry skin completely.  Peel paper labeled #1 off the back of the Monitor strip exposing the adhesive. Place the monitor on the chest in the vertical or horizontal position shown in the instruction booklet. One arrow on the monitor strip must  be pointing upward. Carefully remove paper labeled #2, attaching remainder of strip to your skin. Try not to create any folds or wrinkles in the strip as you apply it.  Firmly press and release the circle in the center of the monitor battery. You will hear a small beep. This is turning the monitor battery on. The heart emblem on the monitor battery will light up every 5 seconds if the monitor battery in turned on and connected to the patient securely. Do not push and hold the circle down as this turns the monitor battery off. The cell phone will locate the monitor battery. A screen will appear on the cell phone checking the connection of your monitor strip. This may read poor connection initially but change to good connection within the next minute. Once your monitor accepts the connection you will hear a series of 3 beeps followed by a climbing crescendo of beeps. A screen will appear on the cell phone showing the two monitor strip placement options. Touch the picture that demonstrates where you applied the monitor strip.  Your monitor strip and battery are waterproof. You are able to shower, bathe, or swim with the monitor on. They just ask you do not submerge deeper than 3 feet underwater. We recommend removing the monitor if you are swimming in a lake, river, or ocean.  Your monitor battery will need to be switched to a fully charged monitor battery approximately once a week. The cell phone will alert you of an action which needs to be made.  On the cell phone, tap for details to reveal connection status, monitor battery status, and cell phone battery status. The green dots indicates your monitor is in good status. A red dot indicates there is something that needs your attention.  To record a symptom, click the circle on the monitor battery. In 30-60 seconds a list of symptoms will appear on the cell phone. Select your symptom and tap save. Your monitor will record a sustained or  significant arrhythmia regardless of you clicking the button. Some patients do not feel the heart rhythm irregularities. [employer] will notify us of any serious or critical events.  Refer to instruction booklet for instructions on switching batteries, changing strips, the Do not disturb or Pause features, or any additional questions.  Call [employer] at [phone], to confirm your monitor is transmitting and record your baseline. They will answer any questions you may have regarding the monitor instructions at that time.  Returning the monitor to [employer]  Place all equipment back into blue box. Peel off strip of paper to expose adhesive and close box securely. There is a prepaid UPS shipping label on this box. Drop in a UPS drop box, or at a UPS facility like [employer]. You may also contact [employer] to arrange UPS to pick up monitor package at your home.    Follow-Up: At [facility], you and your health needs are our priority.  As part of our continuing mission to provide you with exceptional heart care, we have created designated Provider Care Teams.  These Care Teams include your primary Cardiologist (physician) and Advanced Practice Providers (APPs -  Physician Assistants and Nurse Practitioners) who  all work together to provide you with the care you need, when you need it.   Your next appointment:   6 month(s)  Provider:   Parke Poisson, MD

## 2023-12-29 NOTE — Progress Notes (Signed)
 Cardiology Office Note:  .   Date:  12/29/2023  ID:  Brianna Mccarty, DOB 21-Aug-1941, MRN [id number] PCP: [name], MD  [facility] Providers Cardiologist:  None    History of Present Illness: .   Brianna Mccarty is a 83 y.o. female.  Discussed the use of AI scribe software for clinical note transcription with the patient, who gave verbal consent to proceed.  History of Present Illness   The patient, with a past medical history of hypertension, hyperlipidemia, sleep apnea, SVT, mild carotid artery disease, moderate AS, and moderate MR, presents for a follow-up visit. The patient has been experiencing episodes of tachycardia and bradycardia several times a month, which have been managed with metoprolol succinate 25 mg daily. The patient also reports a sensation in the chest, which is described as different from previous palpitations and occurs less than once a month. The sensation is not associated with any discomfort and lasts only for seconds. The patient also reports a slow heart rate, particularly in the mornings, with readings as low as 43 bpm. However, the patient does not experience any associated symptoms such as dizziness or lightheadedness. The patient's hypertension is managed with Lasix 20 mg as needed, amlodipine 10 mg daily, losartan 100 mg daily. The patient's cholesterol is managed with a combination of bempedoic acid and ezetimibe. Also takes prazosin for BP. no longer on hydralazine.        ROS: negative except per HPI above.  Studies Reviewed: Marland Kitchen   EKG Interpretation Date/Time:  Monday December 29 2023 13:18:54 EST Ventricular Rate:  49 PR Interval:  158 QRS Duration:  102 QT Interval:  500 QTC Calculation: 451 R Axis:   -40  Text Interpretation: Sinus bradycardia Left axis deviation When compared with ECG of 18-Jun-2023 02:48, Since last tracing rate slower Confirmed by Weston Brass ([id number]) on 12/29/2023 1:45:37 PM    Results    DIAGNOSTIC Nuclear stress test: Normal (2018) Echocardiogram: Grossly normal findings, mild aortic valve stenosis, severe dilation of left atrium (2019) Heart monitor: Mostly normal rhythm, two runs of short non-sustained ventricular tachycardia, 1600 episodes of supraventricular tachycardia, no atrial fibrillation (01/2022) Echocardiogram: Moderate mitral valve regurgitation, moderate aortic valve stenosis, severe calcification of mitral valve, calcification of aortic valve (12/2023)     Risk Assessment/Calculations:             Physical Exam:   VS:  BP 134/60 (BP Location: Right Arm, Patient Position: Sitting, Cuff Size: Large)   Pulse (!) 50   Ht 5\' 1"  (1.549 m)   Wt 163 lb 3.2 oz (74 kg)   SpO2 96%   BMI 30.84 kg/m    Wt Readings from Last 3 Encounters:  12/29/23 163 lb 3.2 oz (74 kg)  11/11/23 162 lb 8 oz (73.7 kg)  08/05/23 165 lb (74.8 kg)     Physical Exam   VITALS: P- 49 CHEST: Lungs clear to auscultation bilaterally. CARDIOVASCULAR: 2/6 systolic murmur at right upper sternal border, 2/6 mitral valve regurgitation murmur at apex. Bradycardia with heart rate of 49 bpm.     GEN: Well nourished, well developed in no acute distress NECK: No JVD; No carotid bruits  RESPIRATORY:  Clear to auscultation without rales, wheezing or rhonchi  ABDOMEN: Soft, non-tender, non-distended EXTREMITIES:  No edema; No deformity   ASSESSMENT AND PLAN: .    Assessment & Plan Essential hypertension  SVT (supraventricular tachycardia) (HCC)  Palpitations  Bradycardia  Nonrheumatic aortic valve stenosis  Moderate mitral regurgitation  Hyperlipidemia, unspecified hyperlipidemia type   Assessment and Plan    Palpitations Infrequent episodes of palpitations, possibly related to SVT. No symptoms of lightheadedness, dizziness, or chest pain. -Order 30-day heart monitor to further evaluate palpitations and bradycardia. -Continue Metoprolol 25mg  daily. -Advise patient to contact  office if palpitations last longer than usual or if associated with symptoms.  Bradycardia Asymptomatic bradycardia with heart rate in the 40s. No symptoms of lightheadedness, dizziness, or chest pain. -Continue current medications and monitor with 30-day heart monitor. -Advise patient to contact office if symptoms develop.  Mitral Valve Regurgitation and Aortic Stenosis Moderate mitral valve regurgitation and moderate aortic stenosis on recent echocardiogram. No symptoms of heart failure. -Repeat echocardiogram in 1 year. -Follow-up in 6 months.  Hypertension Managed with Amlodipine 10mg  daily, Losartan 100mg  daily, and prazosin 5 mg bid. -Continue current medications. -Advise patient to monitor blood pressure at home.  Hyperlipidemia Managed with Bempedoic acid/Ezetimibe 180mg /10mg  daily. -Continue current medication. Need to review lipid therapy with PCP or in more detail at follow up as we did not address this in detail today.   Fluid Retention Managed with Furosemide 20mg  as needed, now takes daily. -Continue current medication. -Advise patient to monitor for signs of fluid retention.

## 2024-01-01 DIAGNOSIS — J3081 Allergic rhinitis due to animal (cat) (dog) hair and dander: Secondary | ICD-10-CM | POA: Diagnosis not present

## 2024-01-01 DIAGNOSIS — J301 Allergic rhinitis due to pollen: Secondary | ICD-10-CM | POA: Diagnosis not present

## 2024-01-01 DIAGNOSIS — J3089 Other allergic rhinitis: Secondary | ICD-10-CM | POA: Diagnosis not present

## 2024-01-05 DIAGNOSIS — R002 Palpitations: Secondary | ICD-10-CM | POA: Diagnosis not present

## 2024-01-05 DIAGNOSIS — R001 Bradycardia, unspecified: Secondary | ICD-10-CM | POA: Diagnosis not present

## 2024-01-08 DIAGNOSIS — J301 Allergic rhinitis due to pollen: Secondary | ICD-10-CM | POA: Diagnosis not present

## 2024-01-08 DIAGNOSIS — J3081 Allergic rhinitis due to animal (cat) (dog) hair and dander: Secondary | ICD-10-CM | POA: Diagnosis not present

## 2024-01-08 DIAGNOSIS — J3089 Other allergic rhinitis: Secondary | ICD-10-CM | POA: Diagnosis not present

## 2024-01-12 ENCOUNTER — Telehealth: Payer: Self-pay | Admitting: *Deleted

## 2024-01-12 NOTE — Telephone Encounter (Signed)
   Cardiac Monitor Alert  Date of alert:  01/09/2024  Patient Name: Brianna Mccarty  DOB: [birth year]  MRN: [id number]   [facility] Cardiologist: Parke Poisson, MD  [facility] EP:  None    Monitor Information: Cardiac Event Monitor [[employer]]  Reason:  auto trigger from the patient Ordering provider:  acharya   Alert Accelerated junctional rhythm per dr Rennis Golden (dod) This is the 1st alert for this rhythm.   Next Cardiology Appointment   Date:  none  Provider:  acharya  The patient was contacted today.  She is symptomatic.  She reports the following symptoms: see below Arrhythmia, symptoms and history reviewed with patient.   Spoke with pt, she reports Friday she triggered the monitor due to feeling "different" in her chest. She has a hard time describing the feeling she has. She was otherwise okay with no other symptoms reports at the time of triggering the monitor. Plan:  message forwarded to provider and strips placed in provider mailbox for review. No changes in medications at this time.   Deliah Goody, RN  01/12/2024 10:00 AM

## 2024-01-15 ENCOUNTER — Telehealth: Payer: Self-pay

## 2024-01-15 NOTE — Telephone Encounter (Signed)
 Attempted to call pt. No answer, left message for pt to return call. DOD, Dr. Antoine Poche, made aware of this alert.  No orders given at this time.

## 2024-01-22 DIAGNOSIS — J3081 Allergic rhinitis due to animal (cat) (dog) hair and dander: Secondary | ICD-10-CM | POA: Diagnosis not present

## 2024-01-22 DIAGNOSIS — J301 Allergic rhinitis due to pollen: Secondary | ICD-10-CM | POA: Diagnosis not present

## 2024-01-22 DIAGNOSIS — J3089 Other allergic rhinitis: Secondary | ICD-10-CM | POA: Diagnosis not present

## 2024-01-26 ENCOUNTER — Encounter (INDEPENDENT_AMBULATORY_CARE_PROVIDER_SITE_OTHER): Payer: HMO | Admitting: Ophthalmology

## 2024-01-26 ENCOUNTER — Telehealth: Payer: Self-pay

## 2024-01-26 DIAGNOSIS — H353122 Nonexudative age-related macular degeneration, left eye, intermediate dry stage: Secondary | ICD-10-CM

## 2024-01-26 DIAGNOSIS — I10 Essential (primary) hypertension: Secondary | ICD-10-CM

## 2024-01-26 DIAGNOSIS — H43813 Vitreous degeneration, bilateral: Secondary | ICD-10-CM

## 2024-01-26 DIAGNOSIS — H33303 Unspecified retinal break, bilateral: Secondary | ICD-10-CM

## 2024-01-26 DIAGNOSIS — H35033 Hypertensive retinopathy, bilateral: Secondary | ICD-10-CM

## 2024-01-26 DIAGNOSIS — D3131 Benign neoplasm of right choroid: Secondary | ICD-10-CM | POA: Diagnosis not present

## 2024-01-26 DIAGNOSIS — H35341 Macular cyst, hole, or pseudohole, right eye: Secondary | ICD-10-CM | POA: Diagnosis not present

## 2024-01-26 DIAGNOSIS — D3132 Benign neoplasm of left choroid: Secondary | ICD-10-CM

## 2024-01-26 NOTE — Telephone Encounter (Signed)
   Cardiac Monitor Alert  Date of alert:  01/26/2024   Patient Name: Brianna Mccarty  DOB: 05-17-1941  MRN: [id number]   [facility] Cardiologist: Parke Poisson, MD  [facility] EP:  None    Monitor Information: Cardiac Event Monitor [[employer]]  Reason:  Palpitations & Bradycardia Ordering provider:  Dr. Jacques Navy   Alert Idioventricular/Accelerated Idioventricular Rhythm - HR: 50 and 52 This is the 3rd alert for this rhythm.   Next Cardiology Appointment   Date:  N/A  Provider:  N/A  The patient was contacted today.  She is asymptomatic. Arrhythmia, symptoms and history reviewed with DOD Dr. Servando Salina .   Plan:  No changes at this time   Other: Called and spoke to patient Patient states:   -was sleeping last night, does not recall waking with discomfort   -she continues to have episode of tachycardia during the day, occurs once a week   -when tachycardia occurs she takes and additional Metoprolol 25mg   Informed patient  RN will follow up after speaking to DOD   Attempted to call patient, no answer, left detailed message informing patient no changes to medications at this time, continue monitor symptoms at home.   Marilynn Rail, RN  01/26/2024 9:28 AM

## 2024-02-02 ENCOUNTER — Telehealth: Payer: Self-pay | Admitting: Internal Medicine

## 2024-02-02 NOTE — Telephone Encounter (Signed)
 Patient was returning phone call

## 2024-02-02 NOTE — Telephone Encounter (Signed)
"  Serious Event" from [employer] (from [employer]) printed and also saved into pt's chart under MEDIA tab; Alert for Sinus Bradycardia (BPM=49), Accelerated Idioventricular Rhythm w/PVC's.  Alert for 02/02/24 at 03:40 am. 02/03/2024 is the last day of her 30 Day monitor.   Spoke to pt who states she was sleeping at the time.  She states her HR is frequently low, 43-49, once she becomes more active, it goes up to 50-52, and then a bit higher after she is more active; it is generally on the lower end.  She c/o "always feeling tired from allergies and such, but very tired this morning".    She would like to know if it is better to take Metoprolol at bedtime or in the mornings.   6 mo f/u appt made for end of August 2025 with Dr. Jacques Navy.

## 2024-02-03 ENCOUNTER — Telehealth: Payer: Self-pay

## 2024-02-03 NOTE — Telephone Encounter (Signed)
 Called patient and informed her that I had just called to check on her and she states she is fine and has a follow up appt on Friday already scheduled.

## 2024-02-03 NOTE — Telephone Encounter (Signed)
 Patient is returning call. Please advise?

## 2024-02-03 NOTE — Telephone Encounter (Signed)
 Called and left a VMM for patient for a callback. Explained that her heart monitor she is wearing showed a heart rate less than 60 this morning at 02:30 am. Asked if she could let us know if she was having any symptoms at that time.Report to be shown to Dr [place] DOD.

## 2024-02-03 NOTE — Progress Notes (Unsigned)
 Cardiology Clinic Note   Patient Name: Brianna Mccarty Date of Encounter: 02/06/2024  Primary Care Provider:  [name], MD Primary Cardiologist:  Parke Poisson, MD  Patient Profile    Brianna Mccarty 83 year old female presents the clinic today for evaluation of her low heart rate.  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 mild by carotid duplex.   Cervical dysplasia 1971   Coronary artery disease    Diverticulosis of colon    DJD (degenerative joint disease)    Family history of cancer of extrahepatic bile ducts 09/05/2020   Family history of colon cancer 09/05/2020   Family history of pancreatic cancer 09/05/2020   GERD (gastroesophageal reflux disease)    Headache(784.0)    History of kidney stones    Hypercholesterolemia    Hypertension    Interstitial cystitis    sees urologist   Lichen sclerosus    Malignant melanoma (HCC) 2006   sees Dr. Margo Aye in dermatology   Migraines    Mild aortic stenosis    Mitral valve disease    Question mitral valve prolapse in the past, no prolapse by echo 2009   Murmur 10/20/2015   SEES DR NELSON   OSA (obstructive sleep apnea)    USES DENTAL DEVICE   Osteoporosis    on fosomax > 5 years, stopped 11/2015   Pneumonia    SVT (supraventricular tachycardia) (HCC) 02/15/2022   Monitor 02/2022: NSR, avg HR 61; 2 runs of NSVT (4 beats); several runs of Supraventricular Tachycardia (longest 3"11'); no AFib   Thyroid cyst    1 x 1.1 thyroid cyst noted on carotid Doppler, January, 2012   Venous insufficiency    Past Surgical History:  Procedure Laterality Date   BREAST BIOPSY Left 11/25/2011   U/S core, benign performed at [facility], LEFT BREAT MARKER IN   BREAST CYST  ASPIRATION     BREAST SURGERY  2013   Breast Bx-Benign   CARDIAC CATHETERIZATION     CATARACT EXTRACTION, BILATERAL     CHOLECYSTECTOMY N/A 12/22/2019   Procedure: LAPAROSCOPIC CHOLECYSTECTOMY;  Surgeon: Andria Meuse, MD;  Location: [facility];  Service: General;  Laterality: N/A;   cystoscopy and basket stone removal right ureter  02/2006   Dr. Dolphus Jenny SURGERY  12/22/2019   GYNECOLOGIC CRYOSURGERY  1971   left total hip replacement  2004   Dr. Darrelyn Hillock   melanoma removed from medial rleft knee area  2006   Dr. Margo Aye   NASAL SEPTUM SURGERY     TOTAL HIP ARTHROPLASTY Right 08/01/2021   Procedure: TOTAL HIP ARTHROPLASTY ANTERIOR APPROACH;  Surgeon: Ollen Gross, MD;  Location: [facility];  Service: Orthopedics;  Laterality: Right;    Allergies  Allergies  Allergen Reactions   Celebrex [Celecoxib] Diarrhea   Cephalexin     Reaction was a high fever  Other Reaction(s): Unknown   Hydrochlorothiazide     hyponatremia  Other Reaction(s): Unknown   Penicillin G Benzathine     Other Reaction(s): Unknown   Penicillins Hives and Swelling    Swelling of arms & face Has patient had a PCN reaction causing  immediate rash, facial/tongue/throat swelling, SOB or lightheadedness with hypotension: Yes Has patient had a PCN reaction causing severe rash involving mucus membranes or skin necrosis: No Has patient had a PCN reaction that required hospitalization: No Has patient had a PCN reaction occurring within the last 10 years: No If all of the above answers are "NO", then may proceed with Cephalosporin use.    Phenobarbital Hives    Other Reaction(s): Unknown   Statins     Muscle pain   Passion Fruit Flavoring Agent (Non-Screening) Diarrhea and Rash   Tape Rash    MEDICAL TAPE    History of Present Illness    Brianna Mccarty has a PMH of essential hypertension, SVT, palpitations, bradycardia, aortic valve stenosis, mitral valve regurgitation,  hyperlipidemia, and hypertension.  She was seen in follow-up by Dr. Jacques Navy 12/29/2023.  She reported that she had been experiencing episodes of tachycardia and bradycardia several times a month.  She was continued on metoprolol succinate 25 mg daily.  She reported a sensation in her chest that she described as different from previous palpitations.  This would occur less than once per month.  The sensation was not associated with discomfort and would last for seconds.  She also reported slow heart rates in the mornings with readings as low as 43.  She denied lightheadedness and dizziness.  Cardiac event monitor was ordered.  Echocardiogram was ordered  Alert received from [employer].  Alert and indicated sinus bradycardia 49 bpm, accelerated intraventricular rhythm with PVCs on the last day of her cardiac event monitor.  Patient was contacted.  She reported that she was sleeping at that time.  She noted frequent heart rates in the 43-49 range.  Her heart rate increases when she is more physically active to the 50-52 range.  She did note feeling tired from allergies.  Cardiac event monitor 01/06/2022 showed predominantly normal sinus rhythm with average heart rate of 61 bpm.  She had 2 runs of NSVT, 1674 runs of SVT with the longest lasting 3 minutes and 11 seconds, occasional SVE and no atrial fibrillation or significant pauses.  She presents to the clinic today for evaluation and states she is having some left neck and shoulder pain.  We reviewed her recent alert from the [employer].  She reports that she routinely has heart rates in the 40s and 50s.  She is not symptomatic with this.  Her blood pressure today is 134/72.  Her pulse is 61.  We reviewed her recent echocardiogram.  We will keep her follow-up with Dr. Jacques Navy in 6 months..  Today she denies chest pain, shortness of breath, lower extremity edema, fatigue, palpitations, melena, hematuria, hemoptysis, diaphoresis, weakness,  presyncope, syncope, orthopnea, and PND.    Home Medications    Prior to Admission medications   Medication Sig Start Date End Date Taking? Authorizing Provider  acyclovir (ZOVIRAX) 400 MG tablet TAKE 1 TABLET (400 MG TOTAL) BY MOUTH 5 (FIVE) TIMES DAILY AS NEEDED (FEVER BLISTERS). 10/30/23   Chrzanowski, Clearnce Hasten B, NP  ADVAIR HFA 829-56 MCG/ACT inhaler Inhale 2 puffs into the lungs 2 (two) times daily.    [provider]  albuterol (PROAIR HFA) 108 (90 Base) MCG/ACT inhaler Inhale 2 puffs into the lungs every 6 (six) hours as needed for wheezing. 09/10/18   Michele Mcalpine, MD  albuterol (PROVENTIL) (2.5 MG/3ML) 0.083% nebulizer solution Take 3 mLs (2.5 mg total) by nebulization every 6 (six) hours as needed. 09/10/18 07/09/29  Alroy Dust  M, MD  alendronate (FOSAMAX) 70 MG tablet TAKE 1 TABLET EVERY 7 (SEVEN) DAYS. TAKE WITH A FULL GLASS OF WATER ON AN EMPTY STOMACH. 11/11/23   [name], MD  amLODipine (NORVASC) 10 MG tablet Take 1 tablet (10 mg total) by mouth daily. 11/11/23   [name], MD  Ascorbic Acid (VITAMIN C PO) Take 1,000 mg by mouth daily.    [provider]  azelastine (ASTELIN) 0.1 % nasal spray Instill 2 sprays into each nostril two times a day as needed for allergies 02/03/21   Bing Neighbors, NP  Bempedoic Acid-Ezetimibe (NEXLIZET) 180-10 MG TABS Take 1 tablet by mouth daily. 10/13/23   Sharlene Dory, PA-C  budesonide-formoterol (SYMBICORT) 160-4.5 MCG/ACT inhaler Inhale 2 puffs into the lungs 2 (two) times daily. 10/05/21   Wynn Banker, MD  CALCIUM PO Take 1,200 mg by mouth daily.    [provider]  Cholecalciferol 125 MCG (5000 UT) TABS Take 1 tablet by mouth daily.    [provider]  desmopressin (DDAVP) 0.2 MG tablet Take 0.2 mg by mouth at bedtime.    [provider]  EPINEPHrine 0.3 mg/0.3 mL IJ SOAJ injection Inject 0.3 mg into the muscle as needed for anaphylaxis. 12/21/18   [provider]   ferrous sulfate 325 (65 FE) MG tablet TAKE 1 TABLET BY MOUTH TWICE A DAY WITH FOOD Patient taking differently: Take 325 mg by mouth daily with breakfast. 03/27/23   Meryl Dare, MD  fexofenadine (ALLEGRA) 180 MG tablet Take 180 mg by mouth daily.    [provider]  furosemide (LASIX) 20 MG tablet Take 1 tablet (20 mg total) by mouth daily as needed for edema. 11/11/23   [name], MD  hydrOXYzine (ATARAX/VISTARIL) 10 MG tablet Take 10 mg by mouth at bedtime.     [provider]  losartan (COZAAR) 100 MG tablet Take 1 tablet (100 mg total) by mouth daily. 09/05/23   [name], MD  MAGNESIUM PO Take 250 mg by mouth daily.    [provider]  METAMUCIL FIBER PO Take 1 capsule by mouth daily.    [provider]  methylPREDNISolone (MEDROL DOSEPAK) 4 MG TBPK tablet Take package as directed Patient not taking: Reported on 12/29/2023 11/11/23   [name], MD  metoprolol succinate (TOPROL-XL) 25 MG 24 hr tablet TAKE 1 TABLET (25 MG TOTAL) BY MOUTH DAILY. 05/19/23   Meriam Sprague, MD  montelukast (SINGULAIR) 10 MG tablet Take 10 mg by mouth at bedtime.    [provider]  Multiple Vitamin (MULTIVITAMIN PO) Take 1 tablet by mouth daily.    [provider]  omeprazole (PRILOSEC) 40 MG capsule Take 1 capsule (40 mg total) by mouth daily. 11/11/23   [name], MD  OVER THE COUNTER MEDICATION Juice Plus-daily    [provider]  OVER THE COUNTER MEDICATION Nopolea 3 oz once a day    [provider]  pentosan polysulfate (ELMIRON) 100 MG capsule Take 100 mg by mouth daily. Patient not taking: Reported on 12/29/2023    [provider]  prazosin (MINIPRESS) 5 MG capsule TAKE 1 CAPSULE BY MOUTH 2 TIMES DAILY. 10/06/23   [name], MD  Probiotic Product (PROBIOTIC PO) Take 1 capsule by mouth daily.     [provider]    Family History    Family History  Problem Relation Age  of Onset   Pancreatic cancer Father 30  Diabetes Mother    Heart disease Mother    Cancer Brother 69       Bile duct   Diabetes Brother    Hypertension Brother    Diabetes Brother    Heart disease Brother    Hypertension Brother    Hypertension Sister    Hypertension Sister    Colon cancer Paternal Uncle        dx 59s   Lung cancer Paternal Uncle        dx 51s   She indicated that her mother is deceased. She indicated that her father is deceased. She indicated that only one of her two sisters is alive. She indicated that only one of her three brothers is alive. She indicated that her maternal grandmother is deceased. She indicated that her maternal grandfather is deceased. She indicated that her paternal grandmother is deceased. She indicated that her paternal grandfather is deceased. She indicated that both of her paternal uncles are deceased.  Social History    Social History   Socioeconomic History   Marital status: Married    Spouse name: Gerilyn Pilgrim   Number of children: 2   Years of education: Not on file   Highest education level: Not on file  Occupational History   Occupation: retired    Associate Professor: RETIRED  Tobacco Use   Smoking status: Never    Passive exposure: Never   Smokeless tobacco: Never  Vaping Use   Vaping status: Never Used  Substance and Sexual Activity   Alcohol use: Yes    Alcohol/week: 1.0 standard drink of alcohol    Types: 1 Standard drinks or equivalent per week    Comment:  OCC WINE   Drug use: No   Sexual activity: Not Currently    Partners: Male    Birth control/protection: Post-menopausal    Comment: 1st intercourse 51 yo-1 partner  Other Topics Concern   Not on file  Social History Narrative   Updated 12/13/2019   Work or School: none      Home Situation: lives with husband      Spiritual Beliefs: christian      Lifestyle: regular exercise; healthy diet      12/13/2019: Uses treadmill 3/x weekly.    Looks after sister who has been  having memory decline, as well  as husband with minor memory decline.    Enjoys writing poetry, reading, going to church, travel   Social Drivers of Corporate investment banker Strain: Low Risk  (04/23/2023)   Overall Financial Resource Strain (CARDIA)    Difficulty of Paying Living Expenses: Not hard at all  Food Insecurity: No Food Insecurity (04/23/2023)   Hunger Vital Sign    Worried About Running Out of Food in the Last Year: Never true    Ran Out of Food in the Last Year: Never true  Transportation Needs: No Transportation Needs (04/23/2023)   PRAPARE - Administrator, Civil Service (Medical): No    Lack of Transportation (Non-Medical): No  Physical Activity: Insufficiently Active (04/23/2023)   Exercise Vital Sign    Days of Exercise per Week: 2 days    Minutes of Exercise per Session: 30 min  Stress: No Stress Concern Present (04/23/2023)   [employer] of Occupational Health - Occupational Stress Questionnaire    Feeling of Stress : Not at all  Social Connections: Socially Integrated (04/23/2023)   Social Connection and Isolation Panel [NHANES]    Frequency of Communication with Friends and Family:  More than three times a week    Frequency of Social Gatherings with Friends and Family: More than three times a week    Attends Religious Services: More than 4 times per year    Active Member of [employer] or Organizations: Yes    Attends Engineer, structural: More than 4 times per year    Marital Status: Married  Catering manager Violence: Not At Risk (04/23/2023)   Humiliation, Afraid, Rape, and Kick questionnaire    Fear of Current or Ex-Partner: No    Emotionally Abused: No    Physically Abused: No    Sexually Abused: No     Review of Systems    General:  No chills, fever, night sweats or weight changes.  Cardiovascular:  No chest pain, dyspnea on exertion, edema, orthopnea, palpitations, paroxysmal nocturnal dyspnea. Dermatological: No rash,  lesions/masses Respiratory: No cough, dyspnea Urologic: No hematuria, dysuria Abdominal:   No nausea, vomiting, diarrhea, bright red blood per rectum, melena, or hematemesis Neurologic:  No visual changes, wkns, changes in mental status. All other systems reviewed and are otherwise negative except as noted above.  Physical Exam    VS:  BP 134/72   Pulse 61   Ht 5\' 1"  (1.549 m)   Wt 164 lb (74.4 kg)   SpO2 96%   BMI 30.99 kg/m  , BMI Body mass index is 30.99 kg/m. GEN: Well nourished, well developed, in no acute distress. HEENT: normal. Neck: Supple, no JVD, carotid bruits, or masses. Cardiac: RRR, 3/6 systolic murmur heard along right sternal border and left sternal border, rubs, or gallops. No clubbing, cyanosis, edema.  Radials/DP/PT 2+ and equal bilaterally.  Respiratory:  Respirations regular and unlabored, clear to auscultation bilaterally. GI: Soft, nontender, nondistended, BS + x 4. MS: no deformity or atrophy. Skin: warm and dry, no rash. Neuro:  Strength and sensation are intact. Psych: Normal affect.  Accessory Clinical Findings    Recent Labs: 06/17/2023: B Natriuretic Peptide 240.0; Hemoglobin 12.0; Platelets 236 11/11/2023: ALT 12; BUN 18; Creatinine, Ser 0.69; Potassium 4.0; Sodium 139   Recent Lipid Panel    Component Value Date/Time   CHOL 251 (H) 11/11/2023 1508   CHOL 192 07/26/2022 1147   TRIG 81.0 11/11/2023 1508   HDL 68.80 11/11/2023 1508   HDL 76 07/26/2022 1147   CHOLHDL 4 11/11/2023 1508   VLDL 16.2 11/11/2023 1508   LDLCALC 166 (H) 11/11/2023 1508   LDLCALC 109 (H) 07/26/2022 1147   LDLCALC 105 (H) 05/12/2020 1448   LDLDIRECT 126.6 07/18/2010 1045         ECG personally reviewed by me today-none today.     Cardiac event monitor 01/16/2022  Patch wear time was 14 days Predominant rhythm was NSR with average HR 61bpm (ranging from 40-218bpm) There were 2 runs of nonsustained VT with longest lasting 4 beats There were 1674 runs of SVT  with longest lasting and 11 sec There were occasional SVE including couplets and triplets Rare VE (<1%) No Afib or significant pauses  Echocardiogram 12/15/2023  IMPRESSIONS     1. Left ventricular ejection fraction, by estimation, is 60 to 65%. The  left ventricle has normal function. The left ventricle has no regional  wall motion abnormalities. Left ventricular diastolic parameters are  indeterminate. The average left  ventricular global longitudinal strain is -18.7 %. The global longitudinal  strain is normal.   2. Right ventricular systolic function is normal. The right ventricular  size is normal. There is normal  pulmonary artery systolic pressure.   3. Left atrial size was severely dilated.   4. The mitral valve is degenerative. Moderate mitral valve regurgitation.  No evidence of mitral stenosis. Severe mitral annular calcification.   5. Gradients similar to TTE done 07/17/23 . The aortic valve is tricuspid.  There is moderate calcification of the aortic valve. There is moderate  thickening of the aortic valve. Aortic valve regurgitation is not  visualized. Moderate aortic valve  stenosis.   6. Aortic dilatation noted. There is mild dilatation of the ascending  aorta, measuring 38 mm.   7. The inferior vena cava is normal in size with greater than 50%  respiratory variability, suggesting right atrial pressure of 3 mmHg.   FINDINGS   Left Ventricle: Left ventricular ejection fraction, by estimation, is 60  to 65%. The left ventricle has normal function. The left ventricle has no  regional wall motion abnormalities. The average left ventricular global  longitudinal strain is -18.7 %.  The global longitudinal strain is normal. The left ventricular internal  cavity size was normal in size. There is no left ventricular hypertrophy.  Left ventricular diastolic parameters are indeterminate.   Right Ventricle: The right ventricular size is normal. No increase in  right  ventricular wall thickness. Right ventricular systolic function is  normal. There is normal pulmonary artery systolic pressure. The tricuspid  regurgitant velocity is 2.67 m/s, and   with an assumed right atrial pressure of 3 mmHg, the estimated right  ventricular systolic pressure is 31.5 mmHg.   Left Atrium: Left atrial size was severely dilated.   Right Atrium: Right atrial size was normal in size.   Pericardium: There is no evidence of pericardial effusion.   Mitral Valve: The mitral valve is degenerative in appearance. There is  moderate thickening of the mitral valve leaflet(s). There is moderate  calcification of the mitral valve leaflet(s). Severe mitral annular  calcification. Moderate mitral valve  regurgitation. No evidence of mitral valve stenosis.   Tricuspid Valve: The tricuspid valve is normal in structure. Tricuspid  valve regurgitation is mild . No evidence of tricuspid stenosis.   Aortic Valve: Gradients similar to TTE done 07/17/23. The aortic valve is  tricuspid. There is moderate calcification of the aortic valve. There is  moderate thickening of the aortic valve. Aortic valve regurgitation is not  visualized. Moderate aortic  stenosis is present. Aortic valve mean gradient measures 20.8 mmHg. Aortic  valve peak gradient measures 36.8 mmHg. Aortic valve area, by VTI measures  1.00 cm.   Pulmonic Valve: The pulmonic valve was normal in structure. Pulmonic valve  regurgitation is mild. No evidence of pulmonic stenosis.   Aorta: Aortic dilatation noted. There is mild dilatation of the ascending  aorta, measuring 38 mm.   Venous: The inferior vena cava is normal in size with greater than 50%  respiratory variability, suggesting right atrial pressure of 3 mmHg.   IAS/Shunts: No atrial level shunt detected by color flow Doppler.     Assessment & Plan   1.  Palpitations- Denies recent palpitations, ligtheadedness, presyncope and syncope.  Notes that she has  an odd type sensation in her chest that she feels is related to her valvular defect.  She felt it felt different.  Prior  cardiac event monitor which showed predominantly normal sinus rhythm with an average heart rate of 61 bpm.  She did have 2 runs of nonsustained VT with the longest lasting 4 beats and 1674 runs of SVT with the longest  lasting 3 minutes and 11 seconds.  No atrial fibrillation or pauses were noted.Await results of cardiac event monitor. Continue metoprolol Continue to monitor.  Bradycardia-heart rate today 61 bpm.  Asymptomatic with low heart rates.  These have been ongoing.  Denies episodes of lightheadedness, presyncope or syncope. Continue metoprolol Continue to monitor  Essential hypertension-BP today . Maintain blood pressure log Continue amlodipine, losartan, metoprolol  Mitral valve regurgitation, aortic stenosis-no increased DOE or activity intolerance.  She was noted to have moderate aortic stenosis and moderate mitral valve regurgitation on echocardiogram 12/15/2023 Plan for repeat echocardiogram 2/26  Disposition: Follow-up with Dr. Jacques Navy or me in 4-6 months.   Thomasene Ripple. Tehani Mersman NP-C     02/06/2024, 3:35 PM [facility] [address] Office [phone] Fax [phone]    I spent 14 minutes examining this patient, reviewing medications, and using patient centered shared decision making involving their cardiac care.   I spent  20 minutes reviewing past medical history,  medications, and prior cardiac tests.

## 2024-02-04 ENCOUNTER — Ambulatory Visit (INDEPENDENT_AMBULATORY_CARE_PROVIDER_SITE_OTHER): Payer: HMO

## 2024-02-04 DIAGNOSIS — J3089 Other allergic rhinitis: Secondary | ICD-10-CM | POA: Diagnosis not present

## 2024-02-04 DIAGNOSIS — J301 Allergic rhinitis due to pollen: Secondary | ICD-10-CM | POA: Diagnosis not present

## 2024-02-06 ENCOUNTER — Encounter: Payer: Self-pay | Admitting: General Practice

## 2024-02-06 ENCOUNTER — Ambulatory Visit: Attending: General Practice | Admitting: General Practice

## 2024-02-06 VITALS — BP 134/72 | HR 61 | Ht 61.0 in | Wt 164.0 lb

## 2024-02-06 DIAGNOSIS — I340 Nonrheumatic mitral (valve) insufficiency: Secondary | ICD-10-CM

## 2024-02-06 DIAGNOSIS — M542 Cervicalgia: Secondary | ICD-10-CM | POA: Diagnosis not present

## 2024-02-06 DIAGNOSIS — I350 Nonrheumatic aortic (valve) stenosis: Secondary | ICD-10-CM | POA: Diagnosis not present

## 2024-02-06 DIAGNOSIS — M170 Bilateral primary osteoarthritis of knee: Secondary | ICD-10-CM | POA: Diagnosis not present

## 2024-02-06 DIAGNOSIS — I10 Essential (primary) hypertension: Secondary | ICD-10-CM

## 2024-02-06 DIAGNOSIS — R002 Palpitations: Secondary | ICD-10-CM | POA: Diagnosis not present

## 2024-02-06 DIAGNOSIS — R001 Bradycardia, unspecified: Secondary | ICD-10-CM | POA: Diagnosis not present

## 2024-02-06 NOTE — Patient Instructions (Signed)
 Medication Instructions:  The current medical regimen is effective;  continue present plan and medications as directed. Please refer to the Current Medication list given to you today.  *If you need a refill on your cardiac medications before your next appointment, please call your pharmacy*  Lab Work:    Testing/Procedures: NONE     NONE  Other Instructions PLEASE READ AND FOLLOW ATTACHED  SALTY 6  MAINTAIN PHYSICAL ACTIVITY  Follow-Up: At [facility], you and your health needs are our priority.  As part of our continuing mission to provide you with exceptional heart care, our providers are all part of one team.  This team includes your primary Cardiologist (physician) and Advanced Practice Providers or APPs (Physician Assistants and Nurse Practitioners) who all work together to provide you with the care you need, when you need it.  Your next appointment:   4-6 month(s)  Provider:   Parke Poisson, MD or Edd Fabian, NP                 1st Floor: - Lobby - Registration  - Pharmacy  - Lab - Cafe  2nd Floor: - PV Lab - Diagnostic Testing (echo, CT, nuclear med)  3rd Floor: - Vacant  4th Floor: - TCTS (cardiothoracic surgery) - AFib Clinic - Structural Heart Clinic - Vascular Surgery  - Vascular Ultrasound  5th Floor: - HeartCare Cardiology (general and EP) - Clinical Pharmacy for coumadin, hypertension, lipid, weight-loss medications, and med management appointments    Valet parking services will be available as well.

## 2024-02-08 ENCOUNTER — Other Ambulatory Visit: Payer: Self-pay | Admitting: Family Medicine

## 2024-02-08 DIAGNOSIS — K219 Gastro-esophageal reflux disease without esophagitis: Secondary | ICD-10-CM

## 2024-02-09 NOTE — Telephone Encounter (Signed)
 Patient had OV 4/4 with NP Cleaver  Concerns addressed at visit  No further nursing outreach needed at this time

## 2024-02-12 ENCOUNTER — Telehealth: Payer: Self-pay | Admitting: Pharmacy Technician

## 2024-02-12 ENCOUNTER — Other Ambulatory Visit (HOSPITAL_COMMUNITY): Payer: Self-pay

## 2024-02-12 ENCOUNTER — Ambulatory Visit: Attending: Internal Medicine

## 2024-02-12 DIAGNOSIS — J3081 Allergic rhinitis due to animal (cat) (dog) hair and dander: Secondary | ICD-10-CM | POA: Diagnosis not present

## 2024-02-12 DIAGNOSIS — R001 Bradycardia, unspecified: Secondary | ICD-10-CM

## 2024-02-12 DIAGNOSIS — J3089 Other allergic rhinitis: Secondary | ICD-10-CM | POA: Diagnosis not present

## 2024-02-12 DIAGNOSIS — J301 Allergic rhinitis due to pollen: Secondary | ICD-10-CM | POA: Diagnosis not present

## 2024-02-12 DIAGNOSIS — R002 Palpitations: Secondary | ICD-10-CM

## 2024-02-12 NOTE — Telephone Encounter (Signed)
 Pharmacy Patient Advocate Encounter   Received notification from CoverMyMeds that prior authorization for Nexlizet is required/requested.   Insurance verification completed.   The patient is insured through [facility] ADVANTAGE/RX ADVANCE .   Per test claim: PA required; PA submitted to above mentioned insurance via CoverMyMeds Key/confirmation #/EOC [id number] Status is pending

## 2024-02-13 NOTE — Telephone Encounter (Signed)
 Pharmacy Patient Advocate Encounter  Received notification from [facility] ADVANTAGE/RX ADVANCE that Prior Authorization for nexlizet has been APPROVED from 02/13/24 to 08/14/24   PA #/Case ID/Reference #: [id number]

## 2024-02-19 ENCOUNTER — Ambulatory Visit: Payer: Self-pay

## 2024-02-19 DIAGNOSIS — J3089 Other allergic rhinitis: Secondary | ICD-10-CM | POA: Diagnosis not present

## 2024-02-19 DIAGNOSIS — J301 Allergic rhinitis due to pollen: Secondary | ICD-10-CM | POA: Diagnosis not present

## 2024-02-19 DIAGNOSIS — J3081 Allergic rhinitis due to animal (cat) (dog) hair and dander: Secondary | ICD-10-CM | POA: Diagnosis not present

## 2024-02-19 NOTE — Telephone Encounter (Signed)
  Chief Complaint: neck pain Symptoms: severe neck pain and left shoulder pain Frequency: 2 weeks Pertinent Negatives: Patient denies numbness, weakness, fever Disposition: [] ED /[] Urgent Care (no appt availability in office) / [x] Appointment(In office/virtual)/ []  [facility]/ [] Home Care/ [x] Refused Recommended Disposition /[] [facility]/ []  Follow-up with PCP Additional Notes: Patient reports she was seen at the orthopedic UC last week and was told she has severe arthritis and is having severe neck pain and left shoulder pain. Patient reports she was prescribed 2 tylenol every 4 hours but it is not helping with the pain. She does have a referral to a specialist but is unable to get in until the end of May and was advised to call PCP to see if she can get some pain management in the meantime. Patient would like to avoid appt if possible since she has already been seen for this issue. Advised patient would request medication but we may need to call back to schedule appt. Patient advised to call back with worsening symptoms. Patient verbalized understanding.    Copied from CRM [id number]. Topic: Clinical - Red Word Triage >> Feb 19, 2024  9:35 AM Orien Bird wrote: [facility] that prompted transfer to Nurse Triage: Serve pain in neck and shoulder she stated she has serve arthritis Reason for Disposition  Neck pain is a chronic symptom (recurrent or ongoing AND present > 4 weeks)  Answer Assessment - Initial Assessment Questions 1. ONSET: "When did the pain begin?"      A few weeks worsening 2. LOCATION: "Where does it hurt?"      Neck and left shoulder 3. PATTERN "Does the pain come and go, or has it been constant since it started?"      constant 4. SEVERITY: "How bad is the pain?"  (Scale 1-10; or mild, moderate, severe)   - NO PAIN (0): no pain or only slight stiffness    - MILD (1-3): doesn't interfere with normal activities    - MODERATE (4-7): interferes with normal  activities or awakens from sleep    - SEVERE (8-10):  excruciating pain, unable to do any normal activities      severe 5. RADIATION: "Does the pain go anywhere else, shoot into your arms?"     Left shoulder 6. CORD SYMPTOMS: "Any weakness or numbness of the arms or legs?"     denies 7. CAUSE: "What do you think is causing the neck pain?"     Severe arthritis 8. NECK OVERUSE: "Any recent activities that involved turning or twisting the neck?"     denies 9. OTHER SYMPTOMS: "Do you have any other symptoms?" (e.g., headache, fever, chest pain, difficulty breathing, neck swelling)     denies  Protocols used: Neck Pain or Stiffness-A-AH

## 2024-02-23 NOTE — Telephone Encounter (Signed)
 Ok to close

## 2024-02-27 ENCOUNTER — Ambulatory Visit: Payer: Self-pay

## 2024-02-27 ENCOUNTER — Other Ambulatory Visit: Payer: Self-pay

## 2024-02-27 ENCOUNTER — Encounter (HOSPITAL_COMMUNITY): Payer: Self-pay

## 2024-02-27 ENCOUNTER — Ambulatory Visit: Admitting: Family Medicine

## 2024-02-27 ENCOUNTER — Encounter: Payer: Self-pay | Admitting: *Deleted

## 2024-02-27 ENCOUNTER — Ambulatory Visit
Admission: EM | Admit: 2024-02-27 | Discharge: 2024-02-27 | Disposition: A | Discharge status: Home / Self Care | Attending: Family Medicine | Admitting: Family Medicine

## 2024-02-27 ENCOUNTER — Emergency Department (HOSPITAL_COMMUNITY)
Admission: EM | Admit: 2024-02-27 | Discharge: 2024-02-27 | Disposition: A | Discharge status: Home / Self Care | Attending: Emergency Medicine | Admitting: Emergency Medicine

## 2024-02-27 ENCOUNTER — Emergency Department (HOSPITAL_COMMUNITY)

## 2024-02-27 DIAGNOSIS — J4521 Mild intermittent asthma with (acute) exacerbation: Secondary | ICD-10-CM | POA: Insufficient documentation

## 2024-02-27 DIAGNOSIS — R0602 Shortness of breath: Secondary | ICD-10-CM | POA: Diagnosis not present

## 2024-02-27 DIAGNOSIS — R059 Cough, unspecified: Secondary | ICD-10-CM | POA: Diagnosis not present

## 2024-02-27 LAB — CBC WITH DIFFERENTIAL/PLATELET
Abs Immature Granulocytes: 0.04 10*3/uL (ref 0.00–0.07)
Basophils Absolute: 0 10*3/uL (ref 0.0–0.1)
Basophils Relative: 0 %
Eosinophils Absolute: 0 10*3/uL (ref 0.0–0.5)
Eosinophils Relative: 0 %
HCT: 31.1 % — ABNORMAL LOW (ref 36.0–46.0)
Hemoglobin: 10.1 g/dL — ABNORMAL LOW (ref 12.0–15.0)
Immature Granulocytes: 1 %
Lymphocytes Relative: 5 %
Lymphs Abs: 0.4 10*3/uL — ABNORMAL LOW (ref 0.7–4.0)
MCH: 30 pg (ref 26.0–34.0)
MCHC: 32.5 g/dL (ref 30.0–36.0)
MCV: 92.3 fL (ref 80.0–100.0)
Monocytes Absolute: 0.4 10*3/uL (ref 0.1–1.0)
Monocytes Relative: 5 %
Neutro Abs: 7.1 10*3/uL (ref 1.7–7.7)
Neutrophils Relative %: 89 %
Platelets: 158 10*3/uL (ref 150–400)
RBC: 3.37 MIL/uL — ABNORMAL LOW (ref 3.87–5.11)
RDW: 14.3 % (ref 11.5–15.5)
WBC: 8 10*3/uL (ref 4.0–10.5)
nRBC: 0 % (ref 0.0–0.2)

## 2024-02-27 LAB — COMPREHENSIVE METABOLIC PANEL WITH GFR
ALT: 25 U/L (ref 0–44)
AST: 21 U/L (ref 15–41)
Albumin: 3.6 g/dL (ref 3.5–5.0)
Alkaline Phosphatase: 53 U/L (ref 38–126)
Anion gap: 7 (ref 5–15)
BUN: 16 mg/dL (ref 8–23)
CO2: 25 mmol/L (ref 22–32)
Calcium: 8.6 mg/dL — ABNORMAL LOW (ref 8.9–10.3)
Chloride: 102 mmol/L (ref 98–111)
Creatinine, Ser: 0.79 mg/dL (ref 0.44–1.00)
GFR, Estimated: >60 mL/min (ref >60)
Glucose, Bld: 139 mg/dL — ABNORMAL HIGH (ref 70–99)
Potassium: 4.3 mmol/L (ref 3.5–5.1)
Sodium: 134 mmol/L — ABNORMAL LOW (ref 135–145)
Total Bilirubin: 0.9 mg/dL (ref 0.0–1.2)
Total Protein: 6 g/dL — ABNORMAL LOW (ref 6.5–8.1)

## 2024-02-27 LAB — TROPONIN I (HIGH SENSITIVITY): Troponin I (High Sensitivity): 5 ng/L (ref <18)

## 2024-02-27 LAB — POC SARS CORONAVIRUS 2 AG -  ED: SARS Coronavirus 2 Ag: NEGATIVE

## 2024-02-27 MED ORDER — DEXAMETHASONE SODIUM PHOSPHATE 10 MG/ML IJ SOLN
6.0000 mg | Freq: Once | INTRAMUSCULAR | Status: AC
  Administered 2024-02-27: 6 mg via INTRAVENOUS
  Filled 2024-02-27: qty 1

## 2024-02-27 MED ORDER — PREDNISONE 10 MG PO TABS: 15.0000 mg | ORAL_TABLET | Freq: Every day | ORAL | 0 refills | Status: DC

## 2024-02-27 MED ORDER — MAGNESIUM SULFATE 2 GM/50ML IV SOLN
2.0000 g | Freq: Once | INTRAVENOUS | Status: AC
  Administered 2024-02-27: 2 g via INTRAVENOUS
  Filled 2024-02-27: qty 50

## 2024-02-27 MED ORDER — IPRATROPIUM-ALBUTEROL 0.5-2.5 (3) MG/3ML IN SOLN
3.0000 mL | Freq: Once | RESPIRATORY_TRACT | Status: AC
  Administered 2024-02-27: 3 mL via RESPIRATORY_TRACT
  Filled 2024-02-27: qty 3

## 2024-02-27 MED ORDER — IPRATROPIUM-ALBUTEROL 0.5-2.5 (3) MG/3ML IN SOLN
3.0000 mL | Freq: Once | RESPIRATORY_TRACT | Status: AC
  Administered 2024-02-27: 3 mL via RESPIRATORY_TRACT

## 2024-02-27 MED ORDER — DOXYCYCLINE HYCLATE 100 MG PO CAPS: 100.0000 mg | ORAL_CAPSULE | Freq: Two times a day (BID) | ORAL | 0 refills | Status: DC

## 2024-02-27 MED ORDER — BENZONATATE 200 MG PO CAPS: 200.0000 mg | ORAL_CAPSULE | Freq: Three times a day (TID) | ORAL | 0 refills | Status: DC | PRN

## 2024-02-27 MED ORDER — IPRATROPIUM-ALBUTEROL 0.5-2.5 (3) MG/3ML IN SOLN: 3.0000 mL | Freq: Four times a day (QID) | RESPIRATORY_TRACT | 0 refills | Status: AC | PRN

## 2024-02-27 MED ORDER — ALBUTEROL SULFATE HFA 108 (90 BASE) MCG/ACT IN AERS
2.0000 | INHALATION_SPRAY | RESPIRATORY_TRACT | Status: DC | PRN
  Administered 2024-02-27: 2 via RESPIRATORY_TRACT
  Filled 2024-02-27: qty 6.7

## 2024-02-27 NOTE — Telephone Encounter (Signed)
 Chief Complaint: cough Symptoms: cough Frequency: x 3 days Pertinent Negatives: Patient denies fever Disposition: [] ED /[] Urgent Care (no appt availability in office) / [x] Appointment(In office/virtual)/ []  [facility]/ [] Home Care/ [] Refused Recommended Disposition /[] [facility]/ []  Follow-up with PCP Additional Notes: pt states that her allergies got bad on Tuesday and cough developed and was producing yellow phlegm and now has progressed to brown.   Copied from CRM [id number]. Topic: Appointments - Appointment Scheduling >> Feb 27, 2024  8:08 AM Ovid Blow wrote: Patient/patient representative is calling to schedule an appointment.patient is very congested and doing breathing treatments, coughing up brown mucus and shortness of breath Reason for Disposition  [1] Continuous (nonstop) coughing interferes with work or school AND [2] no improvement using cough treatment per Care Advice  Answer Assessment - Initial Assessment Questions 1. ONSET: "When did the cough begin?"      tuesday 2. SEVERITY: "How bad is the cough today?"      bad 3. SPUTUM: "Describe the color of your sputum" (none, dry cough; clear, white, yellow, green)     brown 4. HEMOPTYSIS: "Are you coughing up any blood?" If so ask: "How much?" (flecks, streaks, tablespoons, etc.)     no 5. DIFFICULTY BREATHING: "Are you having difficulty breathing?" If Yes, ask: "How bad is it?" (e.g., mild, moderate, severe)    - MILD: No SOB at rest, mild SOB with walking, speaks normally in sentences, can lie down, no retractions, pulse < 100.    - MODERATE: SOB at rest, SOB with minimal exertion and prefers to sit, cannot lie down flat, speaks in phrases, mild retractions, audible wheezing, pulse 100-120.    - SEVERE: Very SOB at rest, speaks in single words, struggling to breathe, sitting hunched forward, retractions, pulse > 120      mild 6. FEVER: "Do you have a fever?" If Yes, ask: "What is your temperature, how was  it measured, and when did it start?"     no 7. CARDIAC HISTORY: "Do you have any history of heart disease?" (e.g., heart attack, congestive heart failure)      tachy 8. LUNG HISTORY: "Do you have any history of lung disease?"  (e.g., pulmonary embolus, asthma, emphysema)     asthma  10. OTHER SYMPTOMS: "Do you have any other symptoms?" (e.g., runny nose, wheezing, chest pain)       wheezing  Protocols used: Cough - Acute Productive-A-AH

## 2024-02-27 NOTE — ED Triage Notes (Signed)
 Pt reports hx of asthma. Started having cough on Tuesday- states "allergies are real bad". She reports symptoms have gotten worse and her chest "feels tight". States she did an albuterol  breathing treatment pta. She has an appointment with her PCP today but didn't want to wait to be seen. She also finished a prednisone  taper yesterday that was prescribed by her orthopedic doctor. States prednisone  at higher doses "puts me in to tachycardia" so they treated her with a lower dose. Frequent dry cough noted during triage. Speaking full sentences.

## 2024-02-27 NOTE — ED Provider Notes (Signed)
 [facility] AT [facility] Provider Note   CSN: [id number] Arrival date & time: 02/27/24  1954     History Chief Complaint  Patient presents with   Shortness of Breath    HPI Brianna Mccarty is a 83 y.o. female presenting for chief complaint of SOB. 3 days in nature Went to UC and got a treatment and felt better but comes in for further management.  Has been on steroid burst initially but gets SVT with bursts. Patient's recorded medical, surgical, social, medication list and allergies were reviewed in the Snapshot window as part of the initial history.   Review of Systems   Review of Systems  Constitutional:  Positive for fatigue. Negative for chills and fever.  HENT:  Negative for ear pain and sore throat.   Eyes:  Negative for pain and visual disturbance.  Respiratory:  Positive for shortness of breath and wheezing. Negative for cough.   Cardiovascular:  Negative for chest pain and palpitations.  Gastrointestinal:  Negative for abdominal pain and vomiting.  Genitourinary:  Negative for dysuria and hematuria.  Musculoskeletal:  Negative for arthralgias and back pain.  Skin:  Negative for color change and rash.  Neurological:  Negative for seizures and syncope.  All other systems reviewed and are negative.   Physical Exam Updated Vital Signs BP (!) 143/67 (BP Location: Left Arm)   Pulse 76   Temp 99.7 F (37.6 C) (Oral)   Resp (!) 22   Ht 5\' 1"  (1.549 m)   Wt 72.6 kg   SpO2 96%   BMI 30.23 kg/m  Physical Exam Vitals and nursing note reviewed.  Constitutional:      General: She is not in acute distress.    Appearance: She is well-developed.  HENT:     Head: Normocephalic and atraumatic.  Eyes:     Conjunctiva/sclera: Conjunctivae normal.  Cardiovascular:     Rate and Rhythm: Normal rate and regular rhythm.     Heart sounds: No murmur heard. Pulmonary:     Effort: Pulmonary effort is normal. No respiratory distress.     Breath  sounds: Normal breath sounds.  Abdominal:     General: There is no distension.     Palpations: Abdomen is soft.     Tenderness: There is no abdominal tenderness. There is no right CVA tenderness or left CVA tenderness.  Musculoskeletal:        General: No swelling or tenderness. Normal range of motion.     Cervical back: Neck supple.  Skin:    General: Skin is warm and dry.  Neurological:     General: No focal deficit present.     Mental Status: She is alert and oriented to person, place, and time. Mental status is at baseline.     Cranial Nerves: No cranial nerve deficit.      ED Course/ Medical Decision Making/ A&P    Procedures Procedures   Medications Ordered in ED Medications  albuterol  (VENTOLIN  HFA) 108 (90 Base) MCG/ACT inhaler 2 puff (2 puffs Inhalation Given 02/27/24 2044)  magnesium  sulfate IVPB 2 g 50 mL (2 g Intravenous New Bag/Given 02/27/24 2202)  ipratropium-albuterol  (DUONEB) 0.5-2.5 (3) MG/3ML nebulizer solution 3 mL (3 mLs Nebulization Given 02/27/24 2203)  dexamethasone  (DECADRON ) injection 6 mg (6 mg Intravenous Given 02/27/24 2159)   Medical Decision Making:   Brianna Mccarty is a 83 y.o. female with a history of asthma, who presented to the ED today with acute SOB. Relatively  acute onset in the setting of recent URI. On my initial exam, the pt was SOB and tachypneic. Audible wheezing and grossly decreased breath sounds appreciated.  Reviewed and confirmed nursing documentation for past medical history, family history, social history.    Initial Assessment:   With the patient's presentation of SOB in the above setting, most likely diagnosis is Asthma Exacerbation. Other diagnoses were considered including (but not limited to) CAP, PE, ACS, viral infection, PTX. These are considered less likely due to history of present illness and physical exam findings.   This is most consistent with an acute life/limb threatening illness complicated by underlying chronic  conditions.  Initial Plan:  Empiric treatment of patient's symptoms with immediate initiation of inhaled bronchodilators and IV steroids. Given advanced nature of patient's presentation, will proceed with IV magnesium  as a rescue therapy.   Evaluation for ACS with EKG  Evaluation for infectious versus intrathoracic abnormality with chest x-ray  Screening labs including CBC and Metabolic panel to evaluate for infectious or metabolic etiology of disease.  Patient's Wells score is low and patient does not warrant further objective evaluation for PE based on consistency of presentation of alternative diagnosis.  Objective evaluation as below reviewed   Initial Study Results:   Laboratory  All laboratory results reviewed without evidence of clinically relevant pathology.    EKG EKG was reviewed independently. Rate, rhythm, axis, intervals all examined and without medically relevant abnormality. ST segments without concerns for elevations.    Radiology:  All images reviewed independently. Agree with radiology report at this time.   DG Chest 2 View Result Date: 02/27/2024 CLINICAL DATA:  Cough and shortness of breath since Tuesday EXAM: CHEST - 2 VIEW COMPARISON:  06/17/2023 FINDINGS: Frontal and lateral views of the chest demonstrates stable enlargement of the cardiac silhouette. No acute airspace disease, effusion, or pneumothorax. No acute bony abnormalities. IMPRESSION: 1. Stable chest, no acute process. Electronically Signed   By: Brianna Mccarty M.D.   On: 02/27/2024 20:20    Final Assessment and Plan:   After initiation of medical therapies, patient is grossly improved and no longer in acute distress.    Disposition:  I have considered need for hospitalization, however, considering all of the above, I believe this patient is stable for discharge at this time.  Patient/family educated about specific return precautions for given chief complaint and symptoms.  Patient/family educated about  follow-up with PCP.     Patient/family expressed understanding of return precautions and need for follow-up. Patient spoken to regarding all imaging and laboratory results and appropriate follow up for these results. All education provided in verbal form with additional information in written form. Time was allowed for answering of patient questions. Patient discharged.    Emergency Department Medication Summary:   Medications  albuterol  (VENTOLIN  HFA) 108 (90 Base) MCG/ACT inhaler 2 puff (2 puffs Inhalation Given 02/27/24 2044)  magnesium  sulfate IVPB 2 g 50 mL (2 g Intravenous New Bag/Given 02/27/24 2202)  ipratropium-albuterol  (DUONEB) 0.5-2.5 (3) MG/3ML nebulizer solution 3 mL (3 mLs Nebulization Given 02/27/24 2203)  dexamethasone  (DECADRON ) injection 6 mg (6 mg Intravenous Given 02/27/24 2159)     Clinical Impression:  1. Mild intermittent asthma with exacerbation      Discharge   Final Clinical Impression(s) / ED Diagnoses Final diagnoses:  Mild intermittent asthma with exacerbation    Rx / DC Orders ED Discharge Orders     None         Onetha Bile, MD 02/27/24 2300

## 2024-02-27 NOTE — ED Provider Notes (Signed)
 [name]    CSN: [id number] Arrival date & time: 02/27/24  0955      History   Chief Complaint Chief Complaint  Patient presents with   Cough   Asthma    HPI Brianna Mccarty is a 83 y.o. female.    Cough Associated symptoms: rhinorrhea, shortness of breath and wheezing   Asthma Associated symptoms include shortness of breath.  Patient is here for cough, wheezing and sob.  She has h/o asthma and allergies, and usually has problems this time of year.  She started with cough and wheezing about 4 days ago.  Started taking her albuterol  neb every 4 hrs that day.  She actually improved over the next several days, but then worse again last night.  She woke up this morning coughing up green/brown sputum, along with worsening cough and wheezing.  She is unable to tolerate high dose steroids as it "puts me in tachycardia".  She just finished a low dose steroid from her orthopedist for chronic neck pain.  She could tolerate 10mg  daily, but 10mg  bid caused problems.  She also cannot tolerate steroid injections.  No fevers/chills.  No n/v.  Mild runny nose, congestion.        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 mild by carotid duplex.   Cervical dysplasia 1971   Coronary artery disease    Diverticulosis of colon    DJD (degenerative joint disease)    Family history of cancer of extrahepatic bile ducts 09/05/2020   Family history of colon cancer 09/05/2020   Family history of pancreatic cancer 09/05/2020   GERD (gastroesophageal reflux disease)    Headache(784.0)    History of kidney stones    Hypercholesterolemia    Hypertension    Interstitial cystitis    sees urologist   Lichen sclerosus    Malignant melanoma (HCC)  2006   sees Dr. Del Favia in dermatology   Migraines    Mild aortic stenosis    Mitral valve disease    Question mitral valve prolapse in the past, no prolapse by echo 2009   Murmur 10/20/2015   SEES DR NELSON   OSA (obstructive sleep apnea)    USES DENTAL DEVICE   Osteoporosis    on fosomax > 5 years, stopped 11/2015   Pneumonia    SVT (supraventricular tachycardia) (HCC) 02/15/2022   Monitor 02/2022: NSR, avg HR 61; 2 runs of NSVT (4 beats); several runs of Supraventricular Tachycardia (longest 3"11'); no AFib   Thyroid  cyst    1 x 1.1 thyroid  cyst noted on carotid Doppler, January, 2012   Venous insufficiency     Patient Active Problem List   Diagnosis Date Noted   Impacted cerumen of both ears 08/05/2023   Acute diverticulitis 07/11/2022   SVT (supraventricular tachycardia) (HCC) 02/15/2022   Palpitations 01/15/2022   Mild aortic stenosis 01/15/2022   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   Genetic testing 09/13/2020   Family history of pancreatic cancer 09/05/2020   Family history of colon cancer 09/05/2020   Family history of cancer of extrahepatic bile ducts 09/05/2020   Fever  07/26/2020   History of COVID-19 07/26/2020   History of melanoma 05/12/2020   Macular degeneration 02/17/2019   OSA (obstructive sleep apnea) 02/12/2018   Prediabetes 02/12/2018   H/O cold sores 11/27/2015   Murmur 10/20/2015   Carotid artery disease (HCC)    Thyroid  cyst    Venous (peripheral) insufficiency 06/02/2009   Asthma 04/27/2008   Melanoma of skin (HCC) 02/10/2008   Allergic rhinitis 02/10/2008   INTERSTITIAL CYSTITIS 02/10/2008   Pure hypercholesterolemia 02/09/2008   Essential hypertension 02/09/2008   GERD 02/09/2008   Osteoporosis 02/09/2008    Past Surgical History:  Procedure Laterality Date   BREAST BIOPSY Left 11/25/2011   U/S core, benign performed at [facility], LEFT BREAT MARKER IN    BREAST CYST ASPIRATION     BREAST SURGERY  2013   Breast Bx-Benign   CARDIAC CATHETERIZATION     CATARACT EXTRACTION, BILATERAL     CHOLECYSTECTOMY N/A 12/22/2019   Procedure: LAPAROSCOPIC CHOLECYSTECTOMY;  Surgeon: Melvenia Stabs, MD;  Location: [facility];  Service: General;  Laterality: N/A;   cystoscopy and basket stone removal right ureter  02/2006   Dr. Daren Eck SURGERY  12/22/2019   GYNECOLOGIC CRYOSURGERY  1971   left total hip replacement  2004   Dr. Dante Dyer   melanoma removed from medial rleft knee area  2006   Dr. Del Favia   NASAL SEPTUM SURGERY     TOTAL HIP ARTHROPLASTY Right 08/01/2021   Procedure: TOTAL HIP ARTHROPLASTY ANTERIOR APPROACH;  Surgeon: Liliane Rei, MD;  Location: [facility];  Service: Orthopedics;  Laterality: Right;    OB History     Gravida  2   Para  2   Term  2   Preterm      AB      Living  2      SAB      IAB      Ectopic      Multiple      Live Births               Home Medications    Prior to Admission medications   Medication Sig Start Date End Date Taking? Authorizing Provider  acyclovir  (ZOVIRAX ) 400 MG tablet TAKE 1 TABLET (400 MG TOTAL) BY MOUTH 5 (FIVE) TIMES DAILY AS NEEDED (FEVER BLISTERS). 10/30/23  Yes Chrzanowski, Jami B, NP  ADVAIR HFA 115-21 MCG/ACT inhaler Inhale 2 puffs into the lungs 2 (two) times daily.   Yes [provider]  albuterol  (PROAIR  HFA) 108 (90 Base) MCG/ACT inhaler Inhale 2 puffs into the lungs every 6 (six) hours as needed for wheezing. 09/10/18  Yes Lorraine Roses, MD  albuterol  (PROVENTIL ) (2.5 MG/3ML) 0.083% nebulizer solution Take 3 mLs (2.5 mg total) by nebulization every 6 (six) hours as needed. 09/10/18 07/09/29 Yes Lorraine Roses, MD  alendronate  (FOSAMAX ) 70 MG tablet TAKE 1 TABLET EVERY 7 (SEVEN) DAYS. TAKE WITH A FULL GLASS OF WATER  ON AN EMPTY STOMACH. 11/11/23  Yes Aida House, MD  amLODipine  (NORVASC ) 10 MG tablet Take 1 tablet (10 mg  total) by mouth daily. 11/11/23  Yes Aida House, MD  Ascorbic Acid (VITAMIN C) 1000 MG tablet Take 1,000 mg by mouth daily.   Yes [provider]  azelastine  (ASTELIN ) 0.1 % nasal spray Instill 2 sprays into each nostril two times a day as needed for allergies 02/03/21  Yes Buena Carmine, NP  Bempedoic Acid -Ezetimibe  (NEXLIZET ) 180-10 MG TABS Take 1 tablet  by mouth daily. 10/13/23  Yes Conte, Tessa N, PA-C  budesonide -formoterol  (SYMBICORT ) 160-4.5 MCG/ACT inhaler Inhale 2 puffs into the lungs 2 (two) times daily. 10/05/21  Yes Koberlein, Junell C, MD  CALCIUM  PO Take 1,200 mg by mouth daily.   Yes [provider]  Cholecalciferol 125 MCG (5000 UT) TABS Take 1 tablet by mouth daily.   Yes [provider]  desmopressin  (DDAVP ) 0.2 MG tablet Take 0.2 mg by mouth at bedtime.   Yes [provider]  EPINEPHrine  0.3 mg/0.3 mL IJ SOAJ injection Inject 0.3 mg into the muscle as needed for anaphylaxis. 12/21/18  Yes [provider]  fexofenadine (ALLEGRA) 180 MG tablet Take 180 mg by mouth daily.   Yes [provider]  fluticasone (CUTIVATE) 0.05 % cream Apply 1 Application topically 2 (two) times daily. 12/29/23  Yes [provider]  FLUZONE HIGH-DOSE 0.5 ML injection Inject 0.5 mLs into the muscle once. 09/03/23  Yes [provider]  furosemide  (LASIX ) 20 MG tablet Take 1 tablet (20 mg total) by mouth daily as needed for edema. 11/11/23  Yes Aida House, MD  hydrOXYzine  (ATARAX /VISTARIL ) 10 MG tablet Take 10 mg by mouth at bedtime.    Yes [provider]  ketoconazole (NIZORAL) 2 % cream Apply 1 Application topically 2 (two) times daily.   Yes [provider]  losartan  (COZAAR ) 100 MG tablet Take 1 tablet (100 mg total) by mouth daily. 09/05/23  Yes Aida House, MD  MAGNESIUM  PO Take 250 mg by mouth daily.   Yes [provider]  METAMUCIL FIBER PO Take 1 capsule by mouth daily.   Yes [provider]  metoprolol  succinate (TOPROL -XL) 25 MG 24 hr tablet TAKE 1 TABLET (25 MG TOTAL) BY MOUTH DAILY. 05/19/23  Yes Sonny Dust, MD  montelukast  (SINGULAIR ) 10 MG tablet Take 10 mg by mouth at bedtime.   Yes [provider]  Multiple Vitamin (MULTIVITAMIN PO) Take 1 tablet by mouth daily.   Yes [provider]  omeprazole  (PRILOSEC) 40 MG capsule TAKE 1 CAPSULE (40 MG TOTAL) BY MOUTH DAILY. 02/09/24  Yes Aida House, MD  OVER THE COUNTER MEDICATION Juice Plus-daily   Yes [provider]  OVER THE COUNTER MEDICATION Nopolea 3 oz once a day   Yes [provider]  prazosin  (MINIPRESS ) 5 MG capsule TAKE 1 CAPSULE BY MOUTH 2 TIMES DAILY. 10/06/23  Yes Aida House, MD  Probiotic Product (PROBIOTIC PO) Take 1 capsule by mouth daily.    Yes [provider]  vitamin B-12 (CYANOCOBALAMIN ) 100 MCG tablet Take 450 mcg by mouth daily.   Yes [provider]  Vitamin D , Ergocalciferol , (DRISDOL) 1.25 MG (50000 UNIT) CAPS capsule Take 50,000 Units by mouth every 7 (seven) days.   Yes [provider]  Ascorbic Acid (VITAMIN C PO) Take 1,000 mg by mouth daily.    [provider]  azithromycin  (ZITHROMAX ) 250 MG tablet Take 250 mg by mouth daily. Patient not taking: Reported on 02/27/2024    [provider]  ferrous sulfate  325 (65 FE) MG tablet TAKE 1 TABLET BY MOUTH TWICE A DAY WITH FOOD Patient not taking: Reported on 02/06/2024 03/27/23   Asencion Blacksmith, MD  methylPREDNISolone  (MEDROL  DOSEPAK) 4 MG TBPK tablet Take package as directed Patient not taking: Reported on 02/06/2024 11/11/23   Aida House, MD  pentosan polysulfate (ELMIRON ) 100 MG capsule Take 100 mg by mouth daily. Patient not taking: Reported on  02/06/2024    [provider]  predniSONE  (DELTASONE ) 10 MG tablet Take 10 mg by mouth as directed.    [provider]    Family History Family History  Problem Relation Age of  Onset   Pancreatic cancer Father 83   Diabetes Mother    Heart disease Mother    Cancer Brother 53       Bile duct   Diabetes Brother    Hypertension Brother    Diabetes Brother    Heart disease Brother    Hypertension Brother    Hypertension Sister    Hypertension Sister    Colon cancer Paternal Uncle        dx 55s   Lung cancer Paternal Uncle        dx 33s    Social History Social History   Tobacco Use   Smoking status: Never    Passive exposure: Never   Smokeless tobacco: Never  Vaping Use   Vaping status: Never Used  Substance Use Topics   Alcohol use: Yes    Alcohol/week: 1.0 standard drink of alcohol    Types: 1 Standard drinks or equivalent per week    Comment:  OCC WINE   Drug use: No     Allergies   Celebrex [celecoxib], Cephalexin, Hydrochlorothiazide , Penicillin g benzathine, Penicillins, Phenobarbital, Statins, Passion fruit flavoring agent (non-screening), and Tape   Review of Systems Review of Systems  Constitutional: Negative.   HENT:  Positive for congestion and rhinorrhea.   Respiratory:  Positive for cough, shortness of breath and wheezing.   Gastrointestinal: Negative.   Genitourinary: Negative.   Musculoskeletal: Negative.   Psychiatric/Behavioral: Negative.       Physical Exam Triage Vital Signs ED Triage Vitals  Encounter Vitals Group     BP 02/27/24 1008 123/74     Systolic BP Percentile --      Diastolic BP Percentile --      Pulse Rate 02/27/24 1008 62     Resp 02/27/24 1008 20     Temp 02/27/24 1008 98.6 F (37 C)     Temp Source 02/27/24 1008 Oral     SpO2 02/27/24 1008 96 %     Weight --      Height --      Head Circumference --      Peak Flow --      Pain Score 02/27/24 1005 4     Pain Loc --      Pain Education --      Exclude from Growth Chart --    No data found.  Updated Vital Signs BP 123/74 (BP Location: Left Arm)   Pulse 62   Temp 98.6 F (37 C) (Oral)   Resp 20   SpO2 96%   Visual Acuity Right  Eye Distance:   Left Eye Distance:   Bilateral Distance:    Right Eye Near:   Left Eye Near:    Bilateral Near:     Physical Exam Constitutional:      General: She is not in acute distress.    Appearance: Normal appearance. She is normal weight. She is not ill-appearing or toxic-appearing.  HENT:     Nose: Nose normal.     Mouth/Throat:     Mouth: Mucous membranes are moist.  Cardiovascular:     Rate and Rhythm: Normal rate and regular rhythm.  Pulmonary:     Effort: Pulmonary effort is normal.     Breath sounds: Wheezing present.  Comments: Mild wheezes noted at the bases, otherwise clear Musculoskeletal:     Cervical back: Normal range of motion and neck supple.  Skin:    General: Skin is warm.  Neurological:     General: No focal deficit present.     Mental Status: She is alert.  Psychiatric:        Mood and Affect: Mood normal.      UC Treatments / Results  Labs (all labs ordered are listed, but only abnormal results are displayed) Labs Reviewed - No data to display  EKG   Radiology No results found.  Procedures Procedures (including critical care time)  Medications Ordered in UC Medications  ipratropium-albuterol  (DUONEB) 0.5-2.5 (3) MG/3ML nebulizer solution 3 mL (has no administration in time range)   After neb patient felt improved;  mild wheezing at the left lower base  Initial Impression / Assessment and Plan / UC Course  I have reviewed the triage vital signs and the nursing notes.  Pertinent labs & imaging results that were available during my care of the patient were reviewed by me and considered in my medical decision making (see chart for details).   Final Clinical Impressions(s) / UC Diagnoses   Final diagnoses:  Mild intermittent asthma with acute exacerbation     Discharge Instructions      You were seen today for asthma exacerbation.  Your covid swab is negative today.  I have given you a neb treatment today.  I have sent  out several medications to your pharmacy, including a medication for cough, an antibiotic, steroid and nebulizer solution. Please try taking 1.5 tabs of the prednisone .  If this causes tachycardia, then drop to 1 tab daily.  I have sent out a different nebulizer solution.  Please use this every 6hrs as needed for the next several days instead of you albuterol  nebulizer.  If you have worsening symptoms then please go to the ER for further evaluation.      ED Prescriptions     Medication Sig Dispense Auth. Provider   benzonatate  (TESSALON ) 200 MG capsule Take 1 capsule (200 mg total) by mouth 3 (three) times daily as needed for cough. 21 capsule Jeannine Pennisi, MD   doxycycline  (VIBRAMYCIN ) 100 MG capsule Take 1 capsule (100 mg total) by mouth 2 (two) times daily. 20 capsule Shanel Prazak, MD   ipratropium-albuterol  (DUONEB) 0.5-2.5 (3) MG/3ML SOLN Take 3 mLs by nebulization every 6 (six) hours as needed. 120 mL Xareni Kelch, MD   predniSONE  (DELTASONE ) 10 MG tablet Take 1.5 tablets (15 mg total) by mouth daily with breakfast for 10 days. 15 tablet Lesle Ras, MD      PDMP not reviewed this encounter.   Lesle Ras, MD 02/27/24 1048

## 2024-02-27 NOTE — ED Triage Notes (Signed)
 Pt reports with shob and cough since Tuesday. Pt has been taking nebulizer txs and inhalers with no relief.

## 2024-02-27 NOTE — Discharge Instructions (Addendum)
 You were seen today for asthma exacerbation.  Your covid swab is negative today.  I have given you a neb treatment today.  I have sent out several medications to your pharmacy, including a medication for cough, an antibiotic, steroid and nebulizer solution. Please try taking 1.5 tabs of the prednisone .  If this causes tachycardia, then drop to 1 tab daily.  I have sent out a different nebulizer solution.  Please use this every 6hrs as needed for the next several days instead of you albuterol  nebulizer.  If you have worsening symptoms then please go to the ER for further evaluation.

## 2024-03-02 ENCOUNTER — Telehealth: Payer: Self-pay

## 2024-03-02 ENCOUNTER — Other Ambulatory Visit (HOSPITAL_COMMUNITY): Payer: Self-pay

## 2024-03-02 NOTE — Telephone Encounter (Signed)
 Pharmacy Patient Advocate Encounter   Received notification from CoverMyMeds that prior authorization for NEXLIZET  is required/requested.   Insurance verification completed.   The patient is insured through [facility] [employer] .   Per test claim: The current 30 day co-pay is, $161.21.  No PA needed at this time. Patient may be eligible for a Medicare prescription Payment plan. The patient will need to reach out to their insurance company to enrol in the payment plan to spread out their payments throughout the year, If available. This test claim was processed through [facility]- copay amounts may vary at other pharmacies due to pharmacy/plan contracts, or as the patient moves through the different stages of their insurance plan.

## 2024-03-03 ENCOUNTER — Other Ambulatory Visit: Payer: Self-pay

## 2024-03-03 ENCOUNTER — Emergency Department (HOSPITAL_COMMUNITY)

## 2024-03-03 ENCOUNTER — Other Ambulatory Visit (HOSPITAL_COMMUNITY): Payer: Self-pay

## 2024-03-03 ENCOUNTER — Inpatient Hospital Stay (HOSPITAL_COMMUNITY)
Admission: EM | Admit: 2024-03-03 | Discharge: 2024-03-06 | DRG: 308 | Disposition: A | Discharge status: Home / Self Care | Attending: Internal Medicine | Admitting: Internal Medicine

## 2024-03-03 ENCOUNTER — Telehealth (HOSPITAL_COMMUNITY): Payer: Self-pay | Admitting: Pharmacy Technician

## 2024-03-03 DIAGNOSIS — I5033 Acute on chronic diastolic (congestive) heart failure: Secondary | ICD-10-CM | POA: Diagnosis not present

## 2024-03-03 DIAGNOSIS — Z7951 Long term (current) use of inhaled steroids: Secondary | ICD-10-CM

## 2024-03-03 DIAGNOSIS — E7800 Pure hypercholesterolemia, unspecified: Secondary | ICD-10-CM | POA: Diagnosis present

## 2024-03-03 DIAGNOSIS — I350 Nonrheumatic aortic (valve) stenosis: Secondary | ICD-10-CM | POA: Diagnosis not present

## 2024-03-03 DIAGNOSIS — Z8249 Family history of ischemic heart disease and other diseases of the circulatory system: Secondary | ICD-10-CM

## 2024-03-03 DIAGNOSIS — I499 Cardiac arrhythmia, unspecified: Secondary | ICD-10-CM | POA: Diagnosis not present

## 2024-03-03 DIAGNOSIS — R9389 Abnormal findings on diagnostic imaging of other specified body structures: Secondary | ICD-10-CM | POA: Diagnosis present

## 2024-03-03 DIAGNOSIS — J45909 Unspecified asthma, uncomplicated: Secondary | ICD-10-CM | POA: Diagnosis present

## 2024-03-03 DIAGNOSIS — Z91048 Other nonmedicinal substance allergy status: Secondary | ICD-10-CM

## 2024-03-03 DIAGNOSIS — I080 Rheumatic disorders of both mitral and aortic valves: Secondary | ICD-10-CM | POA: Diagnosis present

## 2024-03-03 DIAGNOSIS — I340 Nonrheumatic mitral (valve) insufficiency: Secondary | ICD-10-CM | POA: Diagnosis not present

## 2024-03-03 DIAGNOSIS — R079 Chest pain, unspecified: Secondary | ICD-10-CM | POA: Diagnosis not present

## 2024-03-03 DIAGNOSIS — I10 Essential (primary) hypertension: Secondary | ICD-10-CM | POA: Diagnosis not present

## 2024-03-03 DIAGNOSIS — I2510 Atherosclerotic heart disease of native coronary artery without angina pectoris: Secondary | ICD-10-CM | POA: Diagnosis present

## 2024-03-03 DIAGNOSIS — Z9109 Other allergy status, other than to drugs and biological substances: Secondary | ICD-10-CM

## 2024-03-03 DIAGNOSIS — R001 Bradycardia, unspecified: Secondary | ICD-10-CM | POA: Diagnosis not present

## 2024-03-03 DIAGNOSIS — Z888 Allergy status to other drugs, medicaments and biological substances status: Secondary | ICD-10-CM

## 2024-03-03 DIAGNOSIS — I472 Ventricular tachycardia, unspecified: Secondary | ICD-10-CM | POA: Diagnosis present

## 2024-03-03 DIAGNOSIS — M810 Age-related osteoporosis without current pathological fracture: Secondary | ICD-10-CM | POA: Diagnosis present

## 2024-03-03 DIAGNOSIS — Z800 Family history of malignant neoplasm of digestive organs: Secondary | ICD-10-CM

## 2024-03-03 DIAGNOSIS — Z87442 Personal history of urinary calculi: Secondary | ICD-10-CM

## 2024-03-03 DIAGNOSIS — Z85820 Personal history of malignant melanoma of skin: Secondary | ICD-10-CM

## 2024-03-03 DIAGNOSIS — R002 Palpitations: Principal | ICD-10-CM | POA: Diagnosis present

## 2024-03-03 DIAGNOSIS — J4530 Mild persistent asthma, uncomplicated: Secondary | ICD-10-CM | POA: Diagnosis not present

## 2024-03-03 DIAGNOSIS — I471 Supraventricular tachycardia, unspecified: Secondary | ICD-10-CM | POA: Diagnosis present

## 2024-03-03 DIAGNOSIS — I110 Hypertensive heart disease with heart failure: Secondary | ICD-10-CM | POA: Diagnosis present

## 2024-03-03 DIAGNOSIS — Z833 Family history of diabetes mellitus: Secondary | ICD-10-CM

## 2024-03-03 DIAGNOSIS — I4891 Unspecified atrial fibrillation: Secondary | ICD-10-CM | POA: Diagnosis present

## 2024-03-03 DIAGNOSIS — E871 Hypo-osmolality and hyponatremia: Secondary | ICD-10-CM | POA: Diagnosis not present

## 2024-03-03 DIAGNOSIS — R918 Other nonspecific abnormal finding of lung field: Secondary | ICD-10-CM | POA: Diagnosis not present

## 2024-03-03 DIAGNOSIS — Z96643 Presence of artificial hip joint, bilateral: Secondary | ICD-10-CM | POA: Diagnosis present

## 2024-03-03 DIAGNOSIS — I484 Atypical atrial flutter: Secondary | ICD-10-CM | POA: Diagnosis present

## 2024-03-03 DIAGNOSIS — G4730 Sleep apnea, unspecified: Secondary | ICD-10-CM | POA: Diagnosis not present

## 2024-03-03 DIAGNOSIS — Z87410 Personal history of cervical dysplasia: Secondary | ICD-10-CM

## 2024-03-03 DIAGNOSIS — G4733 Obstructive sleep apnea (adult) (pediatric): Secondary | ICD-10-CM | POA: Diagnosis present

## 2024-03-03 DIAGNOSIS — I4892 Unspecified atrial flutter: Principal | ICD-10-CM | POA: Diagnosis present

## 2024-03-03 DIAGNOSIS — J45901 Unspecified asthma with (acute) exacerbation: Secondary | ICD-10-CM | POA: Diagnosis present

## 2024-03-03 DIAGNOSIS — I493 Ventricular premature depolarization: Secondary | ICD-10-CM | POA: Diagnosis present

## 2024-03-03 DIAGNOSIS — I872 Venous insufficiency (chronic) (peripheral): Secondary | ICD-10-CM | POA: Diagnosis present

## 2024-03-03 DIAGNOSIS — K219 Gastro-esophageal reflux disease without esophagitis: Secondary | ICD-10-CM | POA: Diagnosis present

## 2024-03-03 DIAGNOSIS — Z880 Allergy status to penicillin: Secondary | ICD-10-CM

## 2024-03-03 DIAGNOSIS — Z7983 Long term (current) use of bisphosphonates: Secondary | ICD-10-CM

## 2024-03-03 DIAGNOSIS — I517 Cardiomegaly: Secondary | ICD-10-CM | POA: Diagnosis not present

## 2024-03-03 DIAGNOSIS — N3010 Interstitial cystitis (chronic) without hematuria: Secondary | ICD-10-CM | POA: Diagnosis present

## 2024-03-03 DIAGNOSIS — R000 Tachycardia, unspecified: Secondary | ICD-10-CM | POA: Diagnosis not present

## 2024-03-03 DIAGNOSIS — Z79899 Other long term (current) drug therapy: Secondary | ICD-10-CM

## 2024-03-03 DIAGNOSIS — Z1152 Encounter for screening for COVID-19: Secondary | ICD-10-CM

## 2024-03-03 DIAGNOSIS — Z801 Family history of malignant neoplasm of trachea, bronchus and lung: Secondary | ICD-10-CM

## 2024-03-03 DIAGNOSIS — G43909 Migraine, unspecified, not intractable, without status migrainosus: Secondary | ICD-10-CM | POA: Diagnosis present

## 2024-03-03 LAB — CBC
HCT: 34.3 % — ABNORMAL LOW (ref 36.0–46.0)
Hemoglobin: 11.1 g/dL — ABNORMAL LOW (ref 12.0–15.0)
MCH: 29.5 pg (ref 26.0–34.0)
MCHC: 32.4 g/dL (ref 30.0–36.0)
MCV: 91.2 fL (ref 80.0–100.0)
Platelets: 197 10*3/uL (ref 150–400)
RBC: 3.76 MIL/uL — ABNORMAL LOW (ref 3.87–5.11)
RDW: 13.5 % (ref 11.5–15.5)
WBC: 6.9 10*3/uL (ref 4.0–10.5)
nRBC: 0 % (ref 0.0–0.2)

## 2024-03-03 LAB — RESPIRATORY PANEL BY PCR

## 2024-03-03 LAB — URINALYSIS, ROUTINE W REFLEX MICROSCOPIC
Bilirubin Urine: NEGATIVE
Glucose, UA: NEGATIVE mg/dL
Hgb urine dipstick: NEGATIVE
Ketones, ur: NEGATIVE mg/dL
Leukocytes,Ua: NEGATIVE
Nitrite: NEGATIVE
Protein, ur: NEGATIVE mg/dL
Specific Gravity, Urine: 1.012 (ref 1.005–1.030)
pH: 7 (ref 5.0–8.0)

## 2024-03-03 LAB — BASIC METABOLIC PANEL WITH GFR
Anion gap: 11 (ref 5–15)
BUN: 19 mg/dL (ref 8–23)
CO2: 23 mmol/L (ref 22–32)
Calcium: 9.4 mg/dL (ref 8.9–10.3)
Chloride: 98 mmol/L (ref 98–111)
Creatinine, Ser: 0.68 mg/dL (ref 0.44–1.00)
GFR, Estimated: >60 mL/min (ref >60)
Glucose, Bld: 147 mg/dL — ABNORMAL HIGH (ref 70–99)
Potassium: 4 mmol/L (ref 3.5–5.1)
Sodium: 132 mmol/L — ABNORMAL LOW (ref 135–145)

## 2024-03-03 LAB — BRAIN NATRIURETIC PEPTIDE: B Natriuretic Peptide: 247.5 pg/mL — ABNORMAL HIGH (ref 0.0–100.0)

## 2024-03-03 LAB — PROCALCITONIN: Procalcitonin: <0.1 ng/mL

## 2024-03-03 LAB — D-DIMER, QUANTITATIVE: D-Dimer, Quant: 0.54 ug{FEU}/mL — ABNORMAL HIGH (ref 0.00–0.50)

## 2024-03-03 LAB — PROTIME-INR
INR: 1.1 (ref 0.8–1.2)
Prothrombin Time: 14.1 s (ref 11.4–15.2)

## 2024-03-03 LAB — TROPONIN I (HIGH SENSITIVITY)
Troponin I (High Sensitivity): 12 ng/L (ref <18)
Troponin I (High Sensitivity): 17 ng/L (ref <18)

## 2024-03-03 LAB — TSH: TSH: 1.047 u[IU]/mL (ref 0.350–4.500)

## 2024-03-03 LAB — SARS CORONAVIRUS 2 BY RT PCR: SARS Coronavirus 2 by RT PCR: NEGATIVE

## 2024-03-03 LAB — MAGNESIUM: Magnesium: 1.6 mg/dL — ABNORMAL LOW (ref 1.7–2.4)

## 2024-03-03 MED ORDER — LOSARTAN POTASSIUM 50 MG PO TABS
100.0000 mg | ORAL_TABLET | Freq: Every day | ORAL | Status: DC
  Administered 2024-03-03 – 2024-03-06 (×4): 100 mg via ORAL
  Filled 2024-03-03 (×4): qty 2

## 2024-03-03 MED ORDER — DOXYCYCLINE HYCLATE 100 MG PO CAPS
100.0000 mg | ORAL_CAPSULE | Freq: Two times a day (BID) | ORAL | Status: DC
Start: 2024-03-03 — End: 2024-03-03

## 2024-03-03 MED ORDER — DILTIAZEM HCL ER COATED BEADS 180 MG PO CP24
180.0000 mg | ORAL_CAPSULE | Freq: Every day | ORAL | Status: DC
  Administered 2024-03-04 – 2024-03-06 (×3): 180 mg via ORAL
  Filled 2024-03-03 (×3): qty 1

## 2024-03-03 MED ORDER — ACETAMINOPHEN 325 MG PO TABS: 650.0000 mg | ORAL_TABLET | Freq: Four times a day (QID) | ORAL | Status: DC | PRN

## 2024-03-03 MED ORDER — HYDRALAZINE HCL 50 MG PO TABS: 50.0000 mg | ORAL_TABLET | Freq: Every day | ORAL | Status: DC | PRN

## 2024-03-03 MED ORDER — MAGNESIUM OXIDE -MG SUPPLEMENT 400 (240 MG) MG PO TABS
800.0000 mg | ORAL_TABLET | Freq: Once | ORAL | Status: AC
  Administered 2024-03-03: 800 mg via ORAL
  Filled 2024-03-03: qty 2

## 2024-03-03 MED ORDER — ONDANSETRON HCL 4 MG/2ML IJ SOLN
4.0000 mg | Freq: Four times a day (QID) | INTRAMUSCULAR | Status: DC | PRN
  Administered 2024-03-04: 4 mg via INTRAVENOUS
  Filled 2024-03-03: qty 2

## 2024-03-03 MED ORDER — APIXABAN 5 MG PO TABS
5.0000 mg | ORAL_TABLET | Freq: Once | ORAL | Status: AC
  Administered 2024-03-03: 5 mg via ORAL
  Filled 2024-03-03: qty 1

## 2024-03-03 MED ORDER — DILTIAZEM HCL ER COATED BEADS 120 MG PO CP24
120.0000 mg | ORAL_CAPSULE | Freq: Once | ORAL | Status: AC
  Administered 2024-03-03: 120 mg via ORAL
  Filled 2024-03-03 (×2): qty 1

## 2024-03-03 MED ORDER — DILTIAZEM HCL 30 MG PO TABS
30.0000 mg | ORAL_TABLET | Freq: Once | ORAL | Status: AC
  Administered 2024-03-03: 30 mg via ORAL
  Filled 2024-03-03: qty 1

## 2024-03-03 MED ORDER — LORATADINE 10 MG PO TABS
10.0000 mg | ORAL_TABLET | Freq: Every day | ORAL | Status: DC
  Administered 2024-03-03 – 2024-03-06 (×4): 10 mg via ORAL
  Filled 2024-03-03 (×4): qty 1

## 2024-03-03 MED ORDER — FUROSEMIDE 20 MG PO TABS: 20.0000 mg | ORAL_TABLET | Freq: Every day | ORAL | Status: DC | PRN

## 2024-03-03 MED ORDER — ACETAMINOPHEN 650 MG RE SUPP
650.0000 mg | Freq: Four times a day (QID) | RECTAL | Status: DC | PRN
Start: 2024-03-03 — End: 2024-03-06

## 2024-03-03 MED ORDER — DILTIAZEM HCL-DEXTROSE 125-5 MG/125ML-% IV SOLN (PREMIX)
5.0000 mg/h | INTRAVENOUS | Status: DC
Start: 2024-03-03 — End: 2024-03-03
  Filled 2024-03-03: qty 125

## 2024-03-03 MED ORDER — PREDNISONE 10 MG PO TABS
10.0000 mg | ORAL_TABLET | Freq: Every day | ORAL | Status: DC
  Administered 2024-03-03 – 2024-03-04 (×2): 10 mg via ORAL
  Filled 2024-03-03 (×2): qty 1

## 2024-03-03 MED ORDER — MOMETASONE FURO-FORMOTEROL FUM 200-5 MCG/ACT IN AERO
2.0000 | INHALATION_SPRAY | Freq: Two times a day (BID) | RESPIRATORY_TRACT | Status: DC
  Administered 2024-03-03 – 2024-03-06 (×5): 2 via RESPIRATORY_TRACT
  Filled 2024-03-03: qty 8.8

## 2024-03-03 MED ORDER — MAGNESIUM SULFATE 2 GM/50ML IV SOLN
2.0000 g | Freq: Once | INTRAVENOUS | Status: AC
  Administered 2024-03-03: 2 g via INTRAVENOUS
  Filled 2024-03-03: qty 50

## 2024-03-03 MED ORDER — SODIUM CHLORIDE 0.9% FLUSH
3.0000 mL | Freq: Two times a day (BID) | INTRAVENOUS | Status: DC
Start: 2024-03-03 — End: 2024-03-06
  Administered 2024-03-03 – 2024-03-05 (×6): 3 mL via INTRAVENOUS

## 2024-03-03 MED ORDER — APIXABAN 5 MG PO TABS
5.0000 mg | ORAL_TABLET | Freq: Two times a day (BID) | ORAL | Status: DC
  Administered 2024-03-03 – 2024-03-06 (×6): 5 mg via ORAL
  Filled 2024-03-03 (×6): qty 1

## 2024-03-03 MED ORDER — METOPROLOL TARTRATE 5 MG/5ML IV SOLN
5.0000 mg | INTRAVENOUS | Status: AC | PRN
  Administered 2024-03-03 – 2024-03-04 (×3): 5 mg via INTRAVENOUS
  Filled 2024-03-03 (×3): qty 5

## 2024-03-03 MED ORDER — DOXYCYCLINE HYCLATE 100 MG PO TABS
100.0000 mg | ORAL_TABLET | Freq: Two times a day (BID) | ORAL | Status: DC
Start: 2024-03-03 — End: 2024-03-04
  Administered 2024-03-03 – 2024-03-04 (×2): 100 mg via ORAL
  Filled 2024-03-03 (×2): qty 1

## 2024-03-03 MED ORDER — ALBUTEROL SULFATE (2.5 MG/3ML) 0.083% IN NEBU: 2.5000 mg | INHALATION_SOLUTION | Freq: Four times a day (QID) | RESPIRATORY_TRACT | Status: DC | PRN

## 2024-03-03 MED ORDER — ONDANSETRON HCL 4 MG PO TABS: 4.0000 mg | ORAL_TABLET | Freq: Four times a day (QID) | ORAL | Status: DC | PRN

## 2024-03-03 NOTE — Consult Note (Addendum)
 As below, patient seen and examined.  Briefly she is an 83 year old female with past medical history of hypertension, palpitations, bradycardia, moderate aortic stenosis, mitral regurgitation for evaluation of new onset atrial fibrillation/flutter.  Patient has had intermittent elevated heart rate and palpitations in the past.  Over the past week she developed an asthma exacerbation treated with breathing treatments and prednisone .  Yesterday she developed elevated heart rate/palpitations described as her heart racing.  She took additional metoprolol  but her symptoms did not improve.  She presented to the emergency room and was found to be in atrial flutter.  Cardiology now asked to evaluate.  She otherwise denies typical dyspnea on exertion, orthopnea, PND, pedal edema, exertional chest pain and there is no history of syncope. Note echocardiogram February 2025 showed normal LV function, severe left atrial enlargement, moderate mitral regurgitation, moderate aortic stenosis with mean gradient 21 mmHg and aortic valve area 1 cm.  Monitor April 2025 showed sinus rhythm with minimum heart rate of 38 at 4:33 AM, occasional PVC, PACs.  Electrocardiogram shows atrial flutter.  Chest x-ray with possible infectious bronchiolitis.  Creatinine 0.68, troponins negative, hemoglobin 11.1, normal TSH.  1 newly diagnosed atypical atrial flutter-patient presented with atrial flutter.  Embolic risk factors include age greater than 82, female sex and hypertension.  CHA2DS2-VASc is 4.  Will treat with apixaban 5 mg twice daily.  She has been treated with Cardizem with mildly elevated rate.  Would give an additional 30 mg of regular Cardizem today and increase dose to 180 mg beginning tomorrow morning.  She has had intermittent palpitations recently and therefore I cannot exclude duration of atrial flutter longer than 24 hours.  She feels much better now that her rate is controlled.  Will reassess tomorrow morning.  If she is  asymptomatic and rate is controlled will discharge and plan outpatient cardioversion 3 weeks after she has been therapeutically anticoagulated.  Otherwise would need TEE guided cardioversion.  Can consider addition of antiarrhythmic versus ablation if she has recurrences in the future.  2 moderate aortic stenosis-plan is follow-up echocardiogram February 2026.  This will also reassess mitral regurgitation.  3 history of palpitations-I am discontinuing metoprolol  particularly in light of history of severe asthma.  We are instead treating with Cardizem.  Will follow and adjust as needed.  4 hypertension-follow blood pressure with above medication adjustments.  5 history of bradycardia-will need to follow closely with addition of Cardizem.  As above metoprolol  has been discontinued.  6 asthma-Per primary service.  Brianna Angel, MD       Cardiology Consultation   Patient ID: Brianna Mccarty MRN: [id number]; DOB: Mar 02, 1941  Admit date: 03/03/2024 Date of Consult: 03/03/2024  PCP:  Brianna House, MD   [facility] Providers Cardiologist:  Brianna Herrlich, MD     Patient Profile:   Brianna Mccarty is a 83 y.o. female with a hx of HTN, SVT, palpitations, bradycardia, aortic valve stenosis, mitral valve regurgitation, HLD who is being seen 03/03/2024 for the evaluation of atrial flutter at the request of Brianna Mccarty.  History of Present Illness:   Brianna Mccarty is an 83 year old female with above medical history who is followed by Brianna Mccarty. Per chart review, patient previously underwent nuclear stress test in 06/2021 that was a normal,low rist study. There was a fixed apical inferior perfusion defect with normal wall motion, consistent with artifact. She wore a cardiac monitor in 01/2022 for evaluation of palpitations that showed predominantly normal sinus rhythm  with average HR 61 BPM, 2 runs of NSVT with longest lasting 4 beats, 1674 runs of SVT with the longest lasting 3 min.  She was started on metoprolol .   Patient has been undergoing yearly echos for monitoring of mitral valve regurgitation and aortic stenosis. Most recent echocardiogram from 12/15/23 showed EF 60-65%, no regional wall motion abnormalities, normal RV systolic function, normal PA systolic pressure, moderate MR, moderate aortic valve stenosis.   She was seen by Brianna Mccarty as an outpatient on 12/29/23. At that time, patient reported having episodes of both tachycardia and bradycardia. She remained on metoprolol  succinate 25 mg daily. She wore a cardia event monitor that showed predominantly sinus rhythm, average HR 55 BPM. There was 5% PAC burden, <1% PVC burden. Intermittently noted to have idioventricular rhythm. Patient was called by RN with each notification from [facility] and patient reported being asymptomatic.   Patient was seen in clinic on 02/06/24. There had been an alert received from [employer], Alert and indicated sinus bradycardia 49 bpm, accelerated intraventricular rhythm with PVCs on the last day of her cardiac event monitor. Patient reported that she routinely has HR in the 40s and 50s. She was not symptomatic at these HR. She remained on metoprolol .  Patient presented to the ED on 4/25 complaining of shortness of breath and cough. EKG showed sinus rhythm with PACs, LAFT, HR 73 BPM. CXR showed no acute process. Suspected that she had an asthma exacerbation related to a recent URI. Treated with bronchodilators, IV steroids. Had improvement in symptoms and was discharged from the ED  Patient presented to the ED on 4/30 for evaluation of palpitations, racing heart beat that had been going on for approx 4 hours. EMS found HR in the 80s-160s. EKG showed atrial flutter with variable conduction, HR 101 BPM, LAFB. Labs significant for Na 132, K 4.0, creatinine 0.68, WBC 6.9, hemoglobin 11.1, platelets 197. hsTn negative x2. BNP 247. Patient was given a dose of PO diltiazem and was started  on PO eliquis 5 mg BID.   On interview, patient reports that she has been having episodes of tachycardia for years. She also has episodes of bradycardia with HR into the 40s-50s. When her HR is low, she is asymptomatic. When she has episodes of tachycardia, she has a mild fluttering feeling in her chest. Last week, she started to notice that her asthma symptoms were worsening. She usually has more issues with her asthma in spring and fall. Her symptoms were well managed with her inhalers at home until Friday 4/25. On Friday, she work up with more significant wheezing and shortness of breath. She went to an urgent care and was started on low dose PO prednisone . She continued to have symptoms so she went to the ED and received IV steroids. She did not take any PO prednisone  on Saturday but resumed on Sunday. Yesterday, she was overall feeling well all day. She took her evening metoprolol  as usual and her HR was well controlled around dinner time. Around 8 PM, she started to have palpitations and noticed that her HR was into the 170s. She felt like her HR was beating out of her chest so she came to the ED. She denies alcohol or caffeine use yesterday. Since coming to the hospital, she has been feeling well. No chest pain, shortness of breath. 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 mild by carotid duplex.   Cervical dysplasia 1971   Coronary artery disease    Diverticulosis of colon    DJD (degenerative joint disease)    Family history of cancer of extrahepatic bile ducts 09/05/2020   Family history of colon cancer 09/05/2020   Family history of pancreatic cancer 09/05/2020   GERD (gastroesophageal reflux disease)     Headache(784.0)    History of kidney stones    Hypercholesterolemia    Hypertension    Interstitial cystitis    sees urologist   Lichen sclerosus    Malignant melanoma (HCC) 2006   sees Dr. Del Favia in dermatology   Migraines    Mild aortic stenosis    Mitral valve disease    Question mitral valve prolapse in the past, no prolapse by echo 2009   Murmur 10/20/2015   SEES DR NELSON   OSA (obstructive sleep apnea)    USES DENTAL DEVICE   Osteoporosis    on fosomax > 5 years, stopped 11/2015   Pneumonia    SVT (supraventricular tachycardia) (HCC) 02/15/2022   Monitor 02/2022: NSR, avg HR 61; 2 runs of NSVT (4 beats); several runs of Supraventricular Tachycardia (longest 3"11'); no AFib   Thyroid  cyst    1 x 1.1 thyroid  cyst noted on carotid Doppler, January, 2012   Venous insufficiency     Past Surgical History:  Procedure Laterality Date   BREAST BIOPSY Left 11/25/2011   U/S core, benign performed at [facility], LEFT BREAT MARKER IN   BREAST CYST ASPIRATION     BREAST SURGERY  2013   Breast Bx-Benign   CARDIAC CATHETERIZATION     CATARACT EXTRACTION, BILATERAL     CHOLECYSTECTOMY N/A 12/22/2019   Procedure: LAPAROSCOPIC CHOLECYSTECTOMY;  Surgeon: Melvenia Stabs, MD;  Location: [facility];  Service: General;  Laterality: N/A;   cystoscopy and basket stone removal right ureter  02/2006   Dr. Daren Eck SURGERY  12/22/2019   GYNECOLOGIC CRYOSURGERY  1971   left total hip replacement  2004   Dr. Dante Dyer   melanoma removed from medial rleft knee area  2006   Dr. Del Favia   NASAL SEPTUM SURGERY     TOTAL HIP ARTHROPLASTY Right 08/01/2021   Procedure: TOTAL HIP ARTHROPLASTY ANTERIOR APPROACH;  Surgeon: Liliane Rei, MD;  Location: [facility];  Service: Orthopedics;  Laterality: Right;     Home Medications:  Prior to Admission medications   Medication Sig Start Date End Date Taking? Authorizing Provider  ADVAIR HFA 115-21 MCG/ACT inhaler Inhale 2 puffs into the  lungs 2 (two) times daily.   Yes [provider]  albuterol  (PROAIR  HFA) 108 (90 Base) MCG/ACT inhaler Inhale 2 puffs into the lungs every 6 (six) hours as needed for wheezing. 09/10/18  Yes Lorraine Roses, MD  alendronate  (FOSAMAX ) 70 MG tablet TAKE 1 TABLET EVERY 7 (SEVEN) DAYS. TAKE WITH A FULL GLASS OF WATER  ON AN EMPTY STOMACH. 11/11/23  Yes Brianna House, MD  amLODipine  (NORVASC ) 10 MG tablet Take 1 tablet (10 mg total) by mouth daily. 11/11/23  Yes Brianna House, MD  Ascorbic Acid (VITAMIN C) 1000 MG tablet Take 1,000 mg by mouth daily.   Yes [provider]  azelastine  (ASTELIN ) 0.1 %  nasal spray Instill 2 sprays into each nostril two times a day as needed for allergies 02/03/21  Yes Buena Carmine, NP  Bempedoic Acid -Ezetimibe  (NEXLIZET ) 180-10 MG TABS Take 1 tablet by mouth daily. 10/13/23  Yes Von Grumbling, PA-C  benzonatate  (TESSALON ) 200 MG capsule Take 1 capsule (200 mg total) by mouth 3 (three) times daily as needed for cough. 02/27/24  Yes Piontek, Cleveland Dales, MD  CALCIUM  PO Take 1,200 mg by mouth daily.   Yes [provider]  Cholecalciferol 125 MCG (5000 UT) TABS Take 1 tablet by mouth daily.   Yes [provider]  desmopressin  (DDAVP ) 0.2 MG tablet Take 0.2 mg by mouth at bedtime.   Yes [provider]  doxycycline  (VIBRAMYCIN ) 100 MG capsule Take 1 capsule (100 mg total) by mouth 2 (two) times daily. 02/27/24  Yes Piontek, Erin, MD  EPINEPHrine  0.3 mg/0.3 mL IJ SOAJ injection Inject 0.3 mg into the muscle as needed for anaphylaxis. 12/21/18  Yes [provider]  fexofenadine (ALLEGRA) 180 MG tablet Take 180 mg by mouth daily.   Yes [provider]  fluticasone (CUTIVATE) 0.05 % cream Apply 1 Application topically 2 (two) times daily. 12/29/23  Yes [provider]  furosemide  (LASIX ) 20 MG tablet Take 1 tablet (20 mg total) by mouth daily as needed for edema. 11/11/23  Yes Brianna House, MD  hydrALAZINE   (APRESOLINE ) 50 MG tablet Take 50 mg by mouth daily as needed (for sBP of 150 or greater).   Yes [provider]  hydrOXYzine  (ATARAX /VISTARIL ) 10 MG tablet Take 10 mg by mouth at bedtime.    Yes [provider]  ipratropium-albuterol  (DUONEB) 0.5-2.5 (3) MG/3ML SOLN Take 3 mLs by nebulization every 6 (six) hours as needed. 02/27/24  Yes Piontek, Erin, MD  ketoconazole (NIZORAL) 2 % cream Apply 1 Application topically 2 (two) times daily.   Yes [provider]  losartan  (COZAAR ) 100 MG tablet Take 1 tablet (100 mg total) by mouth daily. 09/05/23  Yes Brianna House, MD  MAGNESIUM  PO Take 250 mg by mouth daily.   Yes [provider]  METAMUCIL FIBER PO Take 1 capsule by mouth daily.   Yes [provider]  metoprolol  succinate (TOPROL -XL) 25 MG 24 hr tablet TAKE 1 TABLET (25 MG TOTAL) BY MOUTH DAILY. 05/19/23  Yes Sonny Dust, MD  Multiple Vitamin (MULTIVITAMIN PO) Take 1 tablet by mouth daily.   Yes [provider]  omeprazole  (PRILOSEC) 40 MG capsule TAKE 1 CAPSULE (40 MG TOTAL) BY MOUTH DAILY. 02/09/24  Yes Brianna House, MD  OVER THE COUNTER MEDICATION Juice Plus-daily   Yes [provider]  OVER THE COUNTER MEDICATION Nopolea 3 oz once a day   Yes [provider]  prazosin  (MINIPRESS ) 5 MG capsule TAKE 1 CAPSULE BY MOUTH 2 TIMES DAILY. 10/06/23  Yes Brianna House, MD  predniSONE  (DELTASONE ) 10 MG tablet Take 1.5 tablets (15 mg total) by mouth daily with breakfast for 10 days. Patient taking differently: Take 10 mg by mouth daily with breakfast. 02/27/24 03/08/24 Yes Piontek, Erin, MD  Probiotic Product (PROBIOTIC PO) Take 1 capsule by mouth daily.    Yes [provider]  vitamin B-12 (CYANOCOBALAMIN ) 100 MCG tablet Take 100 mcg by mouth daily.   Yes [provider]    Inpatient Medications: Scheduled Meds:  apixaban  5 mg Oral BID   loratadine   10 mg Oral Daily   losartan   100 mg Oral  Daily  mometasone -formoterol   2 puff Inhalation BID   predniSONE   10 mg Oral Q breakfast   sodium chloride  flush  3 mL Intravenous Q12H   Continuous Infusions:  PRN Meds: acetaminophen  **OR** acetaminophen , albuterol , metoprolol  tartrate, ondansetron  **OR** ondansetron  (ZOFRAN ) IV  Allergies:    Allergies  Allergen Reactions   Celebrex [Celecoxib] Diarrhea   Cephalexin     Reaction was a high fever  Other Reaction(s): Unknown   Hydrochlorothiazide      hyponatremia  Other Reaction(s): Unknown   Penicillin G Benzathine     Other Reaction(s): Unknown  Other Reaction(s): Not available  penicillin G benzathine   Penicillins Hives and Swelling    Swelling of arms & face  Has patient had a PCN reaction causing immediate rash, facial/tongue/throat swelling, SOB or lightheadedness with hypotension: Yes  Has patient had a PCN reaction causing severe rash involving mucus membranes or skin necrosis: No  Has patient had a PCN reaction that required hospitalization: No  Has patient had a PCN reaction occurring within the last 10 years: No  If all of the above answers are "NO", then may proceed with Cephalosporin use.  Other Reaction(s): Not available   Phenobarbital Hives    Other Reaction(s): Unknown   Statins     Muscle pain   Passion Fruit Flavoring Agent (Non-Screening) Diarrhea and Rash   Tape Rash    MEDICAL TAPE    Social History:   Social History   Socioeconomic History   Marital status: Married    Spouse name: Derwood Flor   Number of children: 2   Years of education: Not on file   Highest education level: Not on file  Occupational History   Occupation: retired    Associate Professor: RETIRED  Tobacco Use   Smoking status: Never    Passive exposure: Never   Smokeless tobacco: Never  Vaping Use   Vaping status: Never Used  Substance and Sexual Activity   Alcohol use: Yes    Alcohol/week: 1.0 standard drink of alcohol    Types: 1 Standard drinks or equivalent per week     Comment:  OCC WINE   Drug use: No   Sexual activity: Not Currently    Partners: Male    Birth control/protection: Post-menopausal    Comment: 1st intercourse 63 yo-1 partner  Other Topics Concern   Not on file  Social History Narrative   Updated 12/13/2019   Work or School: none      Home Situation: lives with husband      Spiritual Beliefs: christian      Lifestyle: regular exercise; healthy diet      12/13/2019: Uses treadmill 3/x weekly.    Looks after sister who has been having memory decline, as well  as husband with minor memory decline.    Enjoys writing poetry, reading, going to church, travel   Social Drivers of Corporate investment banker Strain: Low Risk  (04/23/2023)   Overall Financial Resource Strain (CARDIA)    Difficulty of Paying Living Expenses: Not hard at all  Food Insecurity: No Food Insecurity (03/03/2024)   Hunger Vital Sign    Worried About Running Out of Food in the Last Year: Never true    Ran Out of Food in the Last Year: Never true  Transportation Needs: No Transportation Needs (04/23/2023)   PRAPARE - Administrator, Civil Service (Medical): No    Lack of Transportation (Non-Medical): No  Physical Activity: Insufficiently Active (04/23/2023)   Exercise Vital Sign  Days of Exercise per Week: 2 days    Minutes of Exercise per Session: 30 min  Stress: No Stress Concern Present (04/23/2023)   [employer] of Occupational Health - Occupational Stress Questionnaire    Feeling of Stress : Not at all  Social Connections: Socially Integrated (04/23/2023)   Social Connection and Isolation Panel [NHANES]    Frequency of Communication with Friends and Family: More than three times a week    Frequency of Social Gatherings with Friends and Family: More than three times a week    Attends Religious Services: More than 4 times per year    Active Member of [employer] or Organizations: Yes    Attends Engineer, structural: More than 4 times  per year    Marital Status: Married  Catering manager Violence: Not At Risk (04/23/2023)   Humiliation, Afraid, Rape, and Kick questionnaire    Fear of Current or Ex-Partner: No    Emotionally Abused: No    Physically Abused: No    Sexually Abused: No    Family History:    Family History  Problem Relation Age of Onset   Pancreatic cancer Father 2   Diabetes Mother    Heart disease Mother    Cancer Brother 67       Bile duct   Diabetes Brother    Hypertension Brother    Diabetes Brother    Heart disease Brother    Hypertension Brother    Hypertension Sister    Hypertension Sister    Colon cancer Paternal Uncle        dx 41s   Lung cancer Paternal Uncle        dx 62s     ROS:  Please see the history of present illness.   All other ROS reviewed and negative.     Physical Exam/Data:   Vitals:   03/03/24 0800 03/03/24 1104 03/03/24 1110 03/03/24 1144  BP: (!) 129/92 116/84  117/78  Pulse: 72 88  96  Resp: 19 13  18   Temp:   98.2 F (36.8 C) 97.9 F (36.6 C)  TempSrc:    Oral  SpO2: 96% 97%  96%  Weight:    71 kg  Height:    5\' 1"  (1.549 m)   No intake or output data in the 24 hours ending 03/03/24 1334    03/03/2024   11:44 AM 02/27/2024    7:59 PM 02/06/2024    2:59 PM  Last 3 Weights  Weight (lbs) 156 lb 8.4 oz 160 lb 164 lb  Weight (kg) 71 kg 72.576 kg 74.39 kg     Body mass index is 29.58 kg/m.  General:  Well nourished, well developed, in no acute distress. Sitting comfortably in the bed  HEENT: normal Neck: no JVD Vascular: Radial pulses 2+ bilaterally Cardiac:  normal S1, S2; irregular rate and rhythm. Grade 2/6 systolic murmur  Lungs:  clear to auscultation bilaterally, no wheezing, rhonchi or rales. Normal WOB on room air  Abd: soft, nontender   Ext: no edema in BLE  Musculoskeletal:  No deformities Skin: warm and dry  Neuro:  CNs 2-12 intact, no focal abnormalities noted Psych:  Normal affect   EKG:  The EKG was personally reviewed and  demonstrates:  atrial flutter with variable AV block, HR 101 BPM, LAFB Telemetry:  Telemetry was personally reviewed and demonstrates:  Atrial flutter with variable conduction, HR in the 100s-120s   Relevant CV Studies: Cardiac Studies & Procedures  ______________________________________________________________________________________________   STRESS TESTS  MYOCARDIAL PERFUSION IMAGING 06/06/2021  Narrative  The left ventricular ejection fraction is hyperdynamic (>65%).  Nuclear stress EF: 74%.  There was no ST segment deviation noted during stress.  The study is normal.  This is a low risk study.  1. Fixed apical inferior perfusion defect with normal wall motion, consistent with artifact 2. Low risk study   ECHOCARDIOGRAM  ECHOCARDIOGRAM COMPLETE 12/15/2023  Narrative ECHOCARDIOGRAM REPORT    Patient Name:   JIADA GIRGENTI Date of Exam: 12/15/2023 Medical Rec #:  [id number]       Height:       60.0 in Accession #:    [id number]      Weight:       162.5 lb Date of Birth:  08/20/1941       BSA:          1.709 m Patient Age:    83 years        BP:           140/64 mmHg Patient Gender: F               HR:           51 bpm. Exam Location:  [address]  Procedure: 2D Echo, 3D Echo, Cardiac Doppler, Color Doppler and Strain Analysis  Indications:    I35.0 Aortic Stenosis  History:        Patient has prior history of Echocardiogram examinations, most recent 07/17/2023. Carotid Disease, Arrythmias:SVT, Signs/Symptoms:Murmur and Palpitations; Risk Factors:Sleep Apnea, Hypertension and HLD.  Sonographer:    Juventino Oppenheim RCS Referring Phys: 7 TESSA N CONTE  IMPRESSIONS   1. Left ventricular ejection fraction, by estimation, is 60 to 65%. The left ventricle has normal function. The left ventricle has no regional wall motion abnormalities. Left ventricular diastolic parameters are indeterminate. The average left ventricular global longitudinal strain is -18.7 %. The  global longitudinal strain is normal. 2. Right ventricular systolic function is normal. The right ventricular size is normal. There is normal pulmonary artery systolic pressure. 3. Left atrial size was severely dilated. 4. The mitral valve is degenerative. Moderate mitral valve regurgitation. No evidence of mitral stenosis. Severe mitral annular calcification. 5. Gradients similar to TTE done 07/17/23 . The aortic valve is tricuspid. There is moderate calcification of the aortic valve. There is moderate thickening of the aortic valve. Aortic valve regurgitation is not visualized. Moderate aortic valve stenosis. 6. Aortic dilatation noted. There is mild dilatation of the ascending aorta, measuring 38 mm. 7. The inferior vena cava is normal in size with greater than 50% respiratory variability, suggesting right atrial pressure of 3 mmHg.  FINDINGS Left Ventricle: Left ventricular ejection fraction, by estimation, is 60 to 65%. The left ventricle has normal function. The left ventricle has no regional wall motion abnormalities. The average left ventricular global longitudinal strain is -18.7 %. The global longitudinal strain is normal. The left ventricular internal cavity size was normal in size. There is no left ventricular hypertrophy. Left ventricular diastolic parameters are indeterminate.  Right Ventricle: The right ventricular size is normal. No increase in right ventricular wall thickness. Right ventricular systolic function is normal. There is normal pulmonary artery systolic pressure. The tricuspid regurgitant velocity is 2.67 m/s, and with an assumed right atrial pressure of 3 mmHg, the estimated right ventricular systolic pressure is 31.5 mmHg.  Left Atrium: Left atrial size was severely dilated.  Right Atrium: Right atrial size was normal in size.  Pericardium:  There is no evidence of pericardial effusion.  Mitral Valve: The mitral valve is degenerative in appearance. There is moderate  thickening of the mitral valve leaflet(s). There is moderate calcification of the mitral valve leaflet(s). Severe mitral annular calcification. Moderate mitral valve regurgitation. No evidence of mitral valve stenosis.  Tricuspid Valve: The tricuspid valve is normal in structure. Tricuspid valve regurgitation is mild . No evidence of tricuspid stenosis.  Aortic Valve: Gradients similar to TTE done 07/17/23. The aortic valve is tricuspid. There is moderate calcification of the aortic valve. There is moderate thickening of the aortic valve. Aortic valve regurgitation is not visualized. Moderate aortic stenosis is present. Aortic valve mean gradient measures 20.8 mmHg. Aortic valve peak gradient measures 36.8 mmHg. Aortic valve area, by VTI measures 1.00 cm.  Pulmonic Valve: The pulmonic valve was normal in structure. Pulmonic valve regurgitation is mild. No evidence of pulmonic stenosis.  Aorta: Aortic dilatation noted. There is mild dilatation of the ascending aorta, measuring 38 mm.  Venous: The inferior vena cava is normal in size with greater than 50% respiratory variability, suggesting right atrial pressure of 3 mmHg.  IAS/Shunts: No atrial level shunt detected by color flow Doppler.   LEFT VENTRICLE PLAX 2D LVIDd:         5.00 cm   Diastology LVIDs:         3.10 cm   LV e' medial:    6.31 cm/s LV PW:         0.80 cm   LV E/e' medial:  21.4 LV IVS:        0.80 cm   LV e' lateral:   7.62 cm/s LVOT diam:     1.90 cm   LV E/e' lateral: 17.7 LV SV:         81 LV SV Index:   47        2D Longitudinal Strain LVOT Area:     2.84 cm  2D Strain GLS (A2C):   -15.6 % 2D Strain GLS (A3C):   -18.0 % 2D Strain GLS (A4C):   -22.5 % 2D Strain GLS Avg:     -18.7 %  3D Volume EF: 3D EF:        64 % LV EDV:       125 ml LV ESV:       45 ml LV SV:        80 ml  RIGHT VENTRICLE RV Basal diam:  3.60 cm RV S prime:     11.70 cm/s TAPSE (M-mode): 2.5 cm RVSP:           31.5 mmHg  LEFT ATRIUM               Index        RIGHT ATRIUM           Index LA diam:        6.30 cm  3.69 cm/m   RA Pressure: 3.00 mmHg LA Vol (A2C):   106.0 ml 62.03 ml/m  RA Area:     20.60 cm LA Vol (A4C):   109.0 ml 63.78 ml/m  RA Volume:   58.50 ml  34.23 ml/m LA Biplane Vol: 108.0 ml 63.20 ml/m AORTIC VALVE AV Area (Vmax):    1.17 cm AV Area (Vmean):   0.97 cm AV Area (VTI):     1.00 cm AV Vmax:           303.20 cm/s AV Vmean:  212.800 cm/s AV VTI:            0.808 m AV Peak Grad:      36.8 mmHg AV Mean Grad:      20.8 mmHg LVOT Vmax:         125.00 cm/s LVOT Vmean:        72.500 cm/s LVOT VTI:          0.285 m LVOT/AV VTI ratio: 0.35  AORTA Ao Root diam: 2.70 cm Ao Asc diam:  3.80 cm  MITRAL VALVE                  TRICUSPID VALVE MV Area (PHT): 4.21 cm       TR Peak grad:   28.5 mmHg MV Decel Time: 180 msec       TR Vmax:        267.00 cm/s MR Peak grad:    112.4 mmHg   Estimated RAP:  3.00 mmHg MR Mean grad:    76.0 mmHg    RVSP:           31.5 mmHg MR Vmax:         530.00 cm/s MR Vmean:        407.0 cm/s   SHUNTS MR PISA:         3.08 cm     Systemic VTI:  0.29 m MR PISA Eff ROA: 18 mm       Systemic Diam: 1.90 cm MR PISA Radius:  0.70 cm MV E velocity: 135.00 cm/s MV A velocity: 73.90 cm/s MV E/A ratio:  1.83  Janelle Mediate MD Electronically signed by Janelle Mediate MD Signature Date/Time: 12/15/2023/2:39:36 PM    Final    MONITORS  CARDIAC EVENT MONITOR 02/12/2024  Narrative   Intermittently noted to have idioventricular rhythm, patient called by RN with each notification from [facility], and patient reports being asymptomatic.  * The predominant rhythm was Sinus Rhythm. * The Maximum Heart Rate recorded was 125 BPM, 04: 04 PM 3/ 6, the Minimum Heart Rate recorded was 38 BPM, 04: 33 AM 3/ 5 and the Average Heart Rate was 55 BPM. * There were 6, 548 VE beats with a burden of < 1 % . There was 1 occurrence of Ventricular Tachycardia with the longest  episode 2s, 05: 04 AM 3/ 8 and fastest episode 139 BPM, 05: 04 AM 3/ 8. * There were 107, 060 SVE beats with a burden of 5 % . * There were 2 manually detected events.       ______________________________________________________________________________________________       Laboratory Data:  High Sensitivity Troponin:   Recent Labs  Lab 02/27/24 2103 03/03/24 0335 03/03/24 0607  TROPONINIHS 5 12 17      Chemistry Recent Labs  Lab 02/27/24 2103 03/03/24 0335  NA 134* 132*  K 4.3 4.0  CL 102 98  CO2 25 23  GLUCOSE 139* 147*  BUN 16 19  CREATININE 0.79 0.68  CALCIUM  8.6* 9.4  MG  --  1.6*  GFRNONAA >60 >60  ANIONGAP 7 11    Recent Labs  Lab 02/27/24 2103  PROT 6.0*  ALBUMIN 3.6  AST 21  ALT 25  ALKPHOS 53  BILITOT 0.9   Lipids No results for input(s): "CHOL", "TRIG", "HDL", "LABVLDL", "LDLCALC", "CHOLHDL" in the last 168 hours.  Hematology Recent Labs  Lab 02/27/24 2103 03/03/24 0335  WBC 8.0 6.9  RBC 3.37* 3.76*  HGB 10.1* 11.1*  HCT 31.1* 34.3*  MCV 92.3 91.2  MCH 30.0 29.5  MCHC 32.5 32.4  RDW 14.3 13.5  PLT 158 197   Thyroid   Recent Labs  Lab 03/03/24 0335  TSH 1.047    BNP Recent Labs  Lab 03/03/24 0335  BNP 247.5*    DDimer  Recent Labs  Lab 03/03/24 0607  DDIMER 0.54*     Radiology/Studies:  DG Chest Port 1 View Result Date: 03/03/2024 CLINICAL DATA:  Chest pain EXAM: PORTABLE CHEST 1 VIEW COMPARISON:  02/27/2024 FINDINGS: Lungs are symmetrically well expanded. Bibasilar peribronchial infiltrates have developed, suggestive of infectious bronchiolitis or aspiration in the acute setting. No pneumothorax. Trace bilateral pleural effusion. Stable cardiomegaly. Pulmonary vascularity is. No acute bone abnormality. IMPRESSION: 1. Interval development of bibasilar peribronchial infiltrates, suggestive of infectious bronchiolitis or aspiration in the acute setting. 2. Stable cardiomegaly Electronically Signed   By: Worthy Heads M.D.    On: 03/03/2024 05:15     Assessment and Plan:   Newly Diagnosed Atrial Flutter, with RVR  SVT Bradycardia  - Previous cardiac monitors have shown episodes of SVT, PACs. Recent cardiac monitor from 02/2024 showed sinus rhythm with avg HR 55 BPM, 5% PAC burden. Patient was intermittently noted to have idioventricular rhythm, was asymptomatic - Most recent echocardiogram from 12/2023 showed EF 60-65%, no regional wall motion abnormalities, normal RV systolic function, moderate MR, severe left atrial dilation  - Patient presented to the ED early this AM complaining of palpitations and elevated HR. Found to be in newly diagnosed atrial flutter with RVR. She was given PO cardizem CD 120 mg with improvement in HR  - TSH within normal limits  - Now, HR in the 100s-120s  - Give an extra 30 mg of cardizem now - Tomorrow AM, start cardizem CD 180 mg daily  - If HR well controlled, plan outpatient cardioversion in 3 weeks  - Continue eliquis 5 mg BID   Moderate MR Moderate AS  - Noted on echocardiogram from 12/2023  - Continue to monitor with annual echocardiogram    Otherwise per primary  - Asthma  - Abnormal cxr   Risk Assessment/Risk Scores:    CHA2DS2-VASc Score = 4  This indicates a 4.8% annual risk of stroke. The patient's score is based upon: CHF History: 0 HTN History: 1 Diabetes History: 0 Stroke History: 0 Vascular Disease History: 0 Age Score: 2 Gender Score: 1     For questions or updates, please contact [facility] Please consult [email or web address] for contact info under    Signed, Debria Fang, PA-C  03/03/2024 1:34 PM

## 2024-03-03 NOTE — ED Notes (Signed)
Pt ambulated to restroom with no assistance. ?

## 2024-03-03 NOTE — ED Provider Notes (Signed)
 [facility] DEPT [facility] Emergency Department Provider Note MRN:  [id number]  Arrival date & time: 03/03/24     Chief Complaint   Palpitations   History of Present Illness   Brianna Mccarty is a 83 y.o. year-old female with a history of hypertension presenting to the ED with chief complaint of potation's.  Worsening palpitations this evening with chest tightness, here for evaluation.  Has had some elevated heart rates recently and is awaiting results of a 30-day monitor.  Has also had some shortness of breath recently that she attributed to asthma.  Currently without shortness of breath, still with some chest tightness.  Denies leg pain or swelling.  Review of Systems  A thorough review of systems was obtained and all systems are negative except as noted in the HPI and PMH.   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 mild by carotid duplex.   Cervical dysplasia 1971   Coronary artery disease    Diverticulosis of colon    DJD (degenerative joint disease)    Family history of cancer of extrahepatic bile ducts 09/05/2020   Family history of colon cancer 09/05/2020   Family history of pancreatic cancer 09/05/2020   GERD (gastroesophageal reflux disease)    Headache(784.0)    History of kidney stones    Hypercholesterolemia    Hypertension    Interstitial cystitis    sees urologist   Lichen sclerosus    Malignant melanoma (HCC) 2006   sees Dr. Del Favia in dermatology   Migraines    Mild aortic stenosis    Mitral valve disease    Question mitral valve prolapse in the past, no prolapse by echo 2009   Murmur 10/20/2015   SEES DR NELSON   OSA (obstructive sleep apnea)    USES DENTAL DEVICE   Osteoporosis     on fosomax > 5 years, stopped 11/2015   Pneumonia    SVT (supraventricular tachycardia) (HCC) 02/15/2022   Monitor 02/2022: NSR, avg HR 61; 2 runs of NSVT (4 beats); several runs of Supraventricular Tachycardia (longest 3"11'); no AFib   Thyroid  cyst    1 x 1.1 thyroid  cyst noted on carotid Doppler, January, 2012   Venous insufficiency     Past Surgical History:  Procedure Laterality Date   BREAST BIOPSY Left 11/25/2011   U/S core, benign performed at [facility], LEFT BREAT MARKER IN   BREAST CYST ASPIRATION     BREAST SURGERY  2013   Breast Bx-Benign   CARDIAC CATHETERIZATION     CATARACT EXTRACTION, BILATERAL     CHOLECYSTECTOMY N/A 12/22/2019   Procedure: LAPAROSCOPIC CHOLECYSTECTOMY;  Surgeon: Melvenia Stabs, MD;  Location: [facility];  Service: General;  Laterality: N/A;   cystoscopy and basket stone removal right ureter  02/2006   Dr. Daren Eck SURGERY  12/22/2019   GYNECOLOGIC CRYOSURGERY  1971   left total hip replacement  2004   Dr. Dante Dyer   melanoma removed from medial rleft knee area  2006   Dr. Del Favia   NASAL SEPTUM SURGERY     TOTAL HIP ARTHROPLASTY Right 08/01/2021   Procedure: TOTAL HIP ARTHROPLASTY ANTERIOR APPROACH;  Surgeon:  Liliane Rei, MD;  Location: [facility];  Service: Orthopedics;  Laterality: Right;    Family History  Problem Relation Age of Onset   Pancreatic cancer Father 69   Diabetes Mother    Heart disease Mother    Cancer Brother 26       Bile duct   Diabetes Brother    Hypertension Brother    Diabetes Brother    Heart disease Brother    Hypertension Brother    Hypertension Sister    Hypertension Sister    Colon cancer Paternal Uncle        dx 23s   Lung cancer Paternal Uncle        dx 68s    Social History   Socioeconomic History   Marital status: Married    Spouse name: Derwood Flor   Number of children: 2   Years of education: Not on file   Highest education level: Not on file  Occupational History    Occupation: retired    Associate Professor: RETIRED  Tobacco Use   Smoking status: Never    Passive exposure: Never   Smokeless tobacco: Never  Vaping Use   Vaping status: Never Used  Substance and Sexual Activity   Alcohol use: Yes    Alcohol/week: 1.0 standard drink of alcohol    Types: 1 Standard drinks or equivalent per week    Comment:  OCC WINE   Drug use: No   Sexual activity: Not Currently    Partners: Male    Birth control/protection: Post-menopausal    Comment: 1st intercourse 74 yo-1 partner  Other Topics Concern   Not on file  Social History Narrative   Updated 12/13/2019   Work or School: none      Home Situation: lives with husband      Spiritual Beliefs: christian      Lifestyle: regular exercise; healthy diet      12/13/2019: Uses treadmill 3/x weekly.    Looks after sister who has been having memory decline, as well  as husband with minor memory decline.    Enjoys writing poetry, reading, going to church, travel   Social Drivers of Corporate investment banker Strain: Low Risk  (04/23/2023)   Overall Financial Resource Strain (CARDIA)    Difficulty of Paying Living Expenses: Not hard at all  Food Insecurity: No Food Insecurity (04/23/2023)   Hunger Vital Sign    Worried About Running Out of Food in the Last Year: Never true    Ran Out of Food in the Last Year: Never true  Transportation Needs: No Transportation Needs (04/23/2023)   PRAPARE - Administrator, Civil Service (Medical): No    Lack of Transportation (Non-Medical): No  Physical Activity: Insufficiently Active (04/23/2023)   Exercise Vital Sign    Days of Exercise per Week: 2 days    Minutes of Exercise per Session: 30 min  Stress: No Stress Concern Present (04/23/2023)   [employer] of Occupational Health - Occupational Stress Questionnaire    Feeling of Stress : Not at all  Social Connections: Socially Integrated (04/23/2023)   Social Connection and Isolation Panel [NHANES]    Frequency  of Communication with Friends and Family: More than three times a week    Frequency of Social Gatherings with Friends and Family: More than three times a week    Attends Religious Services: More than 4 times per year    Active Member of [employer] or Organizations: Yes    Attends [employer]  or Organization Meetings: More than 4 times per year    Marital Status: Married  Catering manager Violence: Not At Risk (04/23/2023)   Humiliation, Afraid, Rape, and Kick questionnaire    Fear of Current or Ex-Partner: No    Emotionally Abused: No    Physically Abused: No    Sexually Abused: No     Physical Exam   Vitals:   03/03/24 0415 03/03/24 0447  BP: 119/82 (!) 128/116  Pulse: 65 72  Resp: (!) 22 17  Temp:    SpO2: 98% 98%    CONSTITUTIONAL: Well-appearing, NAD NEURO/PSYCH:  Alert and oriented x 3, no focal deficits EYES:  eyes equal and reactive ENT/NECK:  no LAD, no JVD CARDIO: Regular rate, well-perfused, normal S1 and S2 PULM:  CTAB no wheezing or rhonchi GI/GU:  non-distended, non-tender MSK/SPINE:  No gross deformities, no edema SKIN:  no rash, atraumatic   *Additional and/or pertinent findings included in MDM below  Diagnostic and Interventional Summary    EKG Interpretation Date/Time:    Ventricular Rate:    PR Interval:    QRS Duration:    QT Interval:    QTC Calculation:   R Axis:      Text Interpretation:         Labs Reviewed  MAGNESIUM  - Abnormal; Notable for the following components:      Result Value   Magnesium  1.6 (*)    All other components within normal limits  CBC - Abnormal; Notable for the following components:   RBC 3.76 (*)    Hemoglobin 11.1 (*)    HCT 34.3 (*)    All other components within normal limits  BASIC METABOLIC PANEL WITH GFR - Abnormal; Notable for the following components:   Sodium 132 (*)    Glucose, Bld 147 (*)    All other components within normal limits  PROTIME-INR  D-DIMER, QUANTITATIVE  TROPONIN I (HIGH SENSITIVITY)    DG  Chest Port 1 View    (Results Pending)    Medications  diltiazem (CARDIZEM CD) 24 hr capsule 120 mg (has no administration in time range)  apixaban (ELIQUIS) tablet 5 mg (has no administration in time range)     Procedures  /  Critical Care .Critical Care  Performed by: Edson Graces, MD Authorized by: Edson Graces, MD   Critical care provider statement:    Critical care time (minutes):  35   Critical care was necessary to treat or prevent imminent or life-threatening deterioration of the following conditions: A-fib with RVR.   Critical care was time spent personally by me on the following activities:  Development of treatment plan with patient or surrogate, discussions with consultants, evaluation of patient's response to treatment, examination of patient, ordering and review of laboratory studies, ordering and review of radiographic studies, ordering and performing treatments and interventions, pulse oximetry, re-evaluation of patient's condition and review of old charts   ED Course and Medical Decision Making  Initial Impression and Ddx Patient in new onset A-fib, sounds that she has had some SVT episodes in the past but never A-fib.  She is not anticoagulated.  There is not a clear cut point at which the A-fib started and so I do not feel she is a good cardioversion candidate at this time.  Rates between 90 and 110 currently, no acute distress.  Still having some mild chest discomfort.  She has had a recent emergency department visit for shortness of breath which raises concern for underlying  cause of new onset A-fib such as PE.  Awaiting labs, D-dimer, troponin, electrolytes.  Past medical/surgical history that increases complexity of ED encounter: History of SVT  Interpretation of Diagnostics I personally reviewed the EKG and my interpretation is as follows: A-fib  No significant blood count or electrolyte disturbance.  D-dimer negative when age-adjusted, first troponin  negative  Patient Reassessment and Ultimate Disposition/Management     On reassessment patient still having some chest tightness, heart rates not really responding to oral diltiazem, still with heart rate of 130.  Will start Dilt drip and request hospitalist admission.  Patient management required discussion with the following services or consulting groups:  Hospitalist Service  Complexity of Problems Addressed Acute illness or injury that poses threat of life of bodily function  Additional Data Reviewed and Analyzed Further history obtained from: Further history from spouse/family member  Additional Factors Impacting ED Encounter Risk Consideration of hospitalization  Merrick Abe. Harless Lien, MD [facility] Emergency Medicine [facility] [email or web address]  Final Clinical Impressions(s) / ED Diagnoses     ICD-10-CM   1. Palpitations  R00.2     2. Atrial fibrillation with rapid ventricular response (HCC)  I48.91       ED Discharge Orders     None        Discharge Instructions Discussed with and Provided to Patient:   Discharge Instructions   None      Edson Graces, MD 03/03/24 [phone]

## 2024-03-03 NOTE — H&P (Addendum)
 History and Physical    Patient: Brianna Mccarty ZOX:[id number] DOB: 03/18/1941 DOA: 03/03/2024 DOS: the patient was seen and examined on 03/03/2024 PCP: Aida House, MD  Patient coming from: Home  Chief Complaint:  Chief Complaint  Patient presents with   Palpitations   HPI: Brianna Mccarty is a 83 y.o. female with medical history significant of hypertension, hyperlipidemia, SVT, carotid artery disease, interstitial cystitis, and OSA presents with complaints of palpitations  Asthma symptoms began last Tuesday with severe dyspnea. She has been using home breathing treatments, which initially provided relief. By Friday morning, she developed a productive cough with greenish-brown sputum, and the treatments became ineffective. She sought care at urgent care, received a stronger breathing treatment, and was prescribed prednisone  and doxycycline . Despite these interventions, she continues to experience significant fatigue and lack of energy.  She has a history of tachycardia. Last night, while watching TV, her heart rate increased to 148 bpm, accompanied by discomfort in the left upper chest. Despite taking her evening dose of metoprolol , she took an additional one and a half doses, but noted her heart rate increased to 178 bpm. She has been wearing a heart monitor, which she turned in about three weeks ago, and is awaiting the report. She describes a sensation of her chest 'jumping out of itself' and experiences shortness of breath during these episodes. No recent leg swelling.  She reports taking an additional dose of her metoprolol  last night after talking to the cardiology monitor without improvement in her symptoms.  Records note she previously wore a event monitor 01/16/2022 which noted mostly normal sinus rhythm with 2 runs of nonsustained VT lasting 4 beats, 1674 runs of SVT with the last morning 3 minutes and 11 seconds, occasional SVE, and no atrial fibrillation or significant pauses  were reported.   Point-of-care COVID antigen screening was negative when checked on 4/25.  In the ED patient was noted to be in atrial flutter and 101 bpm with all other vital signs maintained.  Labs significant for hemoglobin 11.1, sodium 132, glucose 147, magnesium  1.6, D-dimer 0.54, and troponin 12.  Chest x-ray revealed interval development of bibasilar peribronchial infiltrates suggestive of an infectious bronchiolitis or aspiration in acute setting with stable cardiomegaly.  Patient having given diltiazem 120 mg p.o. x 1 dose, metoprolol  5 mg IV x 1 dose.   Review of Systems: As mentioned in the history of present illness. All other systems reviewed and are negative. 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 mild by carotid duplex.   Cervical dysplasia 1971   Coronary artery disease    Diverticulosis of colon    DJD (degenerative joint disease)    Family history of cancer of extrahepatic bile ducts 09/05/2020   Family history of colon cancer 09/05/2020   Family history of pancreatic cancer 09/05/2020   GERD (gastroesophageal reflux disease)    Headache(784.0)    History of kidney stones    Hypercholesterolemia    Hypertension    Interstitial cystitis    sees urologist   Lichen sclerosus    Malignant melanoma (HCC) 2006   sees Dr. Del Favia in dermatology   Migraines    Mild aortic stenosis  Mitral valve disease    Question mitral valve prolapse in the past, no prolapse by echo 2009   Murmur 10/20/2015   SEES DR NELSON   OSA (obstructive sleep apnea)    USES DENTAL DEVICE   Osteoporosis    on fosomax > 5 years, stopped 11/2015   Pneumonia    SVT (supraventricular tachycardia) (HCC) 02/15/2022   Monitor 02/2022: NSR, avg HR 61; 2 runs of NSVT (4  beats); several runs of Supraventricular Tachycardia (longest 3"11'); no AFib   Thyroid  cyst    1 x 1.1 thyroid  cyst noted on carotid Doppler, January, 2012   Venous insufficiency    Past Surgical History:  Procedure Laterality Date   BREAST BIOPSY Left 11/25/2011   U/S core, benign performed at [facility], LEFT BREAT MARKER IN   BREAST CYST ASPIRATION     BREAST SURGERY  2013   Breast Bx-Benign   CARDIAC CATHETERIZATION     CATARACT EXTRACTION, BILATERAL     CHOLECYSTECTOMY N/A 12/22/2019   Procedure: LAPAROSCOPIC CHOLECYSTECTOMY;  Surgeon: Melvenia Stabs, MD;  Location:  SURGERY CENTER;  Service: General;  Laterality: N/A;   cystoscopy and basket stone removal right ureter  02/2006   Dr. Daren Eck SURGERY  12/22/2019   GYNECOLOGIC CRYOSURGERY  1971   left total hip replacement  2004   Dr. Dante Dyer   melanoma removed from medial rleft knee area  2006   Dr. Del Favia   NASAL SEPTUM SURGERY     TOTAL HIP ARTHROPLASTY Right 08/01/2021   Procedure: TOTAL HIP ARTHROPLASTY ANTERIOR APPROACH;  Surgeon: Liliane Rei, MD;  Location: [facility];  Service: Orthopedics;  Laterality: Right;   Social History:  reports that she has never smoked. She has never been exposed to tobacco smoke. She has never used smokeless tobacco. She reports current alcohol use of about 1.0 standard drink of alcohol per week. She reports that she does not use drugs.  Allergies  Allergen Reactions   Celebrex [Celecoxib] Diarrhea   Cephalexin     Reaction was a high fever  Other Reaction(s): Unknown   Hydrochlorothiazide      hyponatremia  Other Reaction(s): Unknown   Penicillin G Benzathine     Other Reaction(s): Unknown  Other Reaction(s): Not available  penicillin G benzathine   Penicillins Hives and Swelling    Swelling of arms & face  Has patient had a PCN reaction causing immediate rash, facial/tongue/throat swelling, SOB or lightheadedness with hypotension: Yes  Has patient had a  PCN reaction causing severe rash involving mucus membranes or skin necrosis: No  Has patient had a PCN reaction that required hospitalization: No  Has patient had a PCN reaction occurring within the last 10 years: No  If all of the above answers are "NO", then may proceed with Cephalosporin use.  Other Reaction(s): Not available   Phenobarbital Hives    Other Reaction(s): Unknown   Statins     Muscle pain   Passion Fruit Flavoring Agent (Non-Screening) Diarrhea and Rash   Tape Rash    MEDICAL TAPE    Family History  Problem Relation Age of Onset   Pancreatic cancer Father 25   Diabetes Mother    Heart disease Mother    Cancer Brother 89       Bile duct   Diabetes Brother    Hypertension Brother    Diabetes Brother    Heart disease Brother    Hypertension Brother    Hypertension Sister    Hypertension  Sister    Colon cancer Paternal Uncle        dx 52s   Lung cancer Paternal Uncle        dx 39s    Prior to Admission medications   Medication Sig Start Date End Date Taking? Authorizing Provider  ADVAIR HFA 115-21 MCG/ACT inhaler Inhale 2 puffs into the lungs 2 (two) times daily.   Yes [provider]  albuterol  (PROAIR  HFA) 108 (90 Base) MCG/ACT inhaler Inhale 2 puffs into the lungs every 6 (six) hours as needed for wheezing. 09/10/18  Yes Lorraine Roses, MD  alendronate  (FOSAMAX ) 70 MG tablet TAKE 1 TABLET EVERY 7 (SEVEN) DAYS. TAKE WITH A FULL GLASS OF WATER  ON AN EMPTY STOMACH. 11/11/23  Yes Aida House, MD  amLODipine  (NORVASC ) 10 MG tablet Take 1 tablet (10 mg total) by mouth daily. 11/11/23  Yes Aida House, MD  Ascorbic Acid (VITAMIN C) 1000 MG tablet Take 1,000 mg by mouth daily.   Yes [provider]  azelastine  (ASTELIN ) 0.1 % nasal spray Instill 2 sprays into each nostril two times a day as needed for allergies 02/03/21  Yes Buena Carmine, NP  Bempedoic Acid -Ezetimibe  (NEXLIZET ) 180-10 MG TABS Take 1 tablet by mouth daily. 10/13/23   Yes Conte, Tessa N, PA-C  benzonatate  (TESSALON ) 200 MG capsule Take 1 capsule (200 mg total) by mouth 3 (three) times daily as needed for cough. 02/27/24  Yes Piontek, Cleveland Dales, MD  CALCIUM  PO Take 1,200 mg by mouth daily.   Yes [provider]  Cholecalciferol 125 MCG (5000 UT) TABS Take 1 tablet by mouth daily.   Yes [provider]  desmopressin  (DDAVP ) 0.2 MG tablet Take 0.2 mg by mouth at bedtime.   Yes [provider]  doxycycline  (VIBRAMYCIN ) 100 MG capsule Take 1 capsule (100 mg total) by mouth 2 (two) times daily. 02/27/24  Yes Piontek, Erin, MD  EPINEPHrine  0.3 mg/0.3 mL IJ SOAJ injection Inject 0.3 mg into the muscle as needed for anaphylaxis. 12/21/18  Yes [provider]  fexofenadine (ALLEGRA) 180 MG tablet Take 180 mg by mouth daily.   Yes [provider]  fluticasone (CUTIVATE) 0.05 % cream Apply 1 Application topically 2 (two) times daily. 12/29/23  Yes [provider]  furosemide  (LASIX ) 20 MG tablet Take 1 tablet (20 mg total) by mouth daily as needed for edema. 11/11/23  Yes Aida House, MD  hydrALAZINE  (APRESOLINE ) 50 MG tablet Take 50 mg by mouth daily as needed (for sBP of 150 or greater).   Yes [provider]  hydrOXYzine  (ATARAX /VISTARIL ) 10 MG tablet Take 10 mg by mouth at bedtime.    Yes [provider]  ipratropium-albuterol  (DUONEB) 0.5-2.5 (3) MG/3ML SOLN Take 3 mLs by nebulization every 6 (six) hours as needed. 02/27/24  Yes Piontek, Erin, MD  ketoconazole (NIZORAL) 2 % cream Apply 1 Application topically 2 (two) times daily.   Yes [provider]  losartan  (COZAAR ) 100 MG tablet Take 1 tablet (100 mg total) by mouth daily. 09/05/23  Yes Aida House, MD  MAGNESIUM  PO Take 250 mg by mouth daily.   Yes [provider]  METAMUCIL FIBER PO Take 1 capsule by mouth daily.   Yes [provider]  metoprolol  succinate (TOPROL -XL) 25 MG 24 hr tablet TAKE 1 TABLET (25 MG TOTAL)  BY MOUTH DAILY. 05/19/23  Yes Sonny Dust, MD  Multiple Vitamin (MULTIVITAMIN PO) Take 1 tablet by mouth daily.   Yes  [provider]  omeprazole  (PRILOSEC) 40 MG capsule TAKE 1 CAPSULE (40 MG TOTAL) BY MOUTH DAILY. 02/09/24  Yes Aida House, MD  OVER THE COUNTER MEDICATION Juice Plus-daily   Yes [provider]  OVER THE COUNTER MEDICATION Nopolea 3 oz once a day   Yes [provider]  prazosin  (MINIPRESS ) 5 MG capsule TAKE 1 CAPSULE BY MOUTH 2 TIMES DAILY. 10/06/23  Yes Aida House, MD  predniSONE  (DELTASONE ) 10 MG tablet Take 1.5 tablets (15 mg total) by mouth daily with breakfast for 10 days. Patient taking differently: Take 10 mg by mouth daily with breakfast. 02/27/24 03/08/24 Yes Piontek, Erin, MD  Probiotic Product (PROBIOTIC PO) Take 1 capsule by mouth daily.    Yes [provider]  vitamin B-12 (CYANOCOBALAMIN ) 100 MCG tablet Take 100 mcg by mouth daily.   Yes [provider]  montelukast  (SINGULAIR ) 10 MG tablet Take 10 mg by mouth at bedtime. Patient not taking: Reported on 03/03/2024    [provider]    Physical Exam: Vitals:   03/03/24 0530 03/03/24 0533 03/03/24 0630 03/03/24 0643  BP: 107/88 107/88 119/73   Pulse: 82  (!) 101   Resp: 18  (!) 21   Temp:    97.9 F (36.6 C)  TempSrc:    Oral  SpO2: 98%  100%     Constitutional: Elderly female currently in no acute distress Eyes: PERRL, lids and conjunctivae normal ENMT: Mucous membranes are moist. Normal dentition.  Neck: normal, supple, no masses, no JVD Respiratory: clear to auscultation bilaterally, no wheezing, no crackles. Normal respiratory effort. No accessory muscle use.  Cardiovascular: Irregular irregular. No extremity edema.   Abdomen: no tenderness, no masses palpated. No hepatosplenomegaly. Bowel sounds positive.  Musculoskeletal: no clubbing / cyanosis. No joint deformity upper and lower extremities. Good ROM, no contractures. Normal  muscle tone.  Skin: no rashes, lesions, ulcers. No induration Neurologic: CN 2-12 grossly intact.  Strength 5/5 in all 4.  Psychiatric: Normal judgment and insight. Alert and oriented x 3. Normal mood.   Data Reviewed:  EKG noted atrial flutter at 97 bpm with a 3:1 AV block.  Reviewed labs, imaging, and pertinent records as documented Assessment and Plan:  New onset atrial flutter Patient presented with complaints of worsening palpitations and chest tightness.  Found to be in atrial fibrillation/flutter.  Records note previous Holter monitor from 01/2022 did not reveal any episodes of atrial fibrillation.  High-sensitivity troponin negative and D-dimer just mildly elevated at 0.54. CHA2DS2-VASc score equal to 4.  Patient was started on Cardizem and given Eliquis. - Admit to a cardiac telemetry bed - Goal potassium at least 4 and magnesium  at least 2 - Check TSH(within normal limits) and BNP(245) - Cardizem - Continue Eliquis - Cardiology consulted, will follow-up for any further recommendations  Asthma, without acute exacerbation Abnormal chest x-ray She reports having continued productive cough.  Records note point-of-care COVID-19 testing was negative until 5 days ago when seen at urgent care.  Patient was started on 10-day course of doxycycline  with prednisone .  Chest x-ray revealed interval development of bibasilar peribronchial infiltrates suggestive of an infectious bronchiolitis or aspiration in acute setting with stable cardiomegaly.   - Check complete respiratory virus panel(negative) - Add on procalcitonin - Continue doxycycline  and prednisone  to complete course - Breathing treatments as needed  Aortic stenosis Records note patient just recently had echocardiogram 12/2023 which noted preserved function with EF noted to be 60 to 65% with moderate aortic  stenosis. - Follow-up echocardiogram planned for 2026.  Essential hypertension Blood pressures currently maintained - Continue  losartan   - Discontinue metoprolol   Hyperlipidemia Patient is on Nexlizet  and currently in need of refill. - Follow-up with primary   DVT prophylaxis: Eliquis Advance Care Planning:   Code Status: Full Code   Consults: Cardiology  Family Communication: None  Severity of Illness: The appropriate patient status for this patient is OBSERVATION. Observation status is judged to be reasonable and necessary in order to provide the required intensity of service to ensure the patient's safety. The patient's presenting symptoms, physical exam findings, and initial radiographic and laboratory data in the context of their medical condition is felt to place them at decreased risk for further clinical deterioration. Furthermore, it is anticipated that the patient will be medically stable for discharge from the hospital within 2 midnights of admission.   Author: Lena Qualia, MD 03/03/2024 7:25 AM  For on call review [email or web address].

## 2024-03-03 NOTE — ED Triage Notes (Signed)
 Pt BIB [facility] from home d/t concerns for racing heart /palpitations for approx 4 hours. EMS found pt to be in A fib RVR initial rates 80s-160s. Given 25 mg Cardizem in route. Took 324 ASA PTA. Pt denies cp/sob/dizziness.

## 2024-03-03 NOTE — Telephone Encounter (Signed)

## 2024-03-03 NOTE — Discharge Instructions (Signed)

## 2024-03-04 DIAGNOSIS — I5033 Acute on chronic diastolic (congestive) heart failure: Secondary | ICD-10-CM

## 2024-03-04 DIAGNOSIS — I10 Essential (primary) hypertension: Secondary | ICD-10-CM | POA: Diagnosis not present

## 2024-03-04 DIAGNOSIS — I4892 Unspecified atrial flutter: Secondary | ICD-10-CM | POA: Diagnosis not present

## 2024-03-04 DIAGNOSIS — J4530 Mild persistent asthma, uncomplicated: Secondary | ICD-10-CM | POA: Diagnosis not present

## 2024-03-04 DIAGNOSIS — E7800 Pure hypercholesterolemia, unspecified: Secondary | ICD-10-CM | POA: Diagnosis not present

## 2024-03-04 DIAGNOSIS — I484 Atypical atrial flutter: Secondary | ICD-10-CM | POA: Diagnosis not present

## 2024-03-04 LAB — BASIC METABOLIC PANEL WITH GFR
Anion gap: 10 (ref 5–15)
BUN: 11 mg/dL (ref 8–23)
CO2: 24 mmol/L (ref 22–32)
Calcium: 9 mg/dL (ref 8.9–10.3)
Chloride: 102 mmol/L (ref 98–111)
Creatinine, Ser: 0.71 mg/dL (ref 0.44–1.00)
GFR, Estimated: >60 mL/min (ref >60)
Glucose, Bld: 109 mg/dL — ABNORMAL HIGH (ref 70–99)
Potassium: 4.2 mmol/L (ref 3.5–5.1)
Sodium: 136 mmol/L (ref 135–145)

## 2024-03-04 LAB — CBC
HCT: 36.6 % (ref 36.0–46.0)
Hemoglobin: 12.3 g/dL (ref 12.0–15.0)
MCH: 29.9 pg (ref 26.0–34.0)
MCHC: 33.6 g/dL (ref 30.0–36.0)
MCV: 89.1 fL (ref 80.0–100.0)
Platelets: 232 10*3/uL (ref 150–400)
RBC: 4.11 MIL/uL (ref 3.87–5.11)
RDW: 13.2 % (ref 11.5–15.5)
WBC: 10 10*3/uL (ref 4.0–10.5)
nRBC: 0 % (ref 0.0–0.2)

## 2024-03-04 MED ORDER — RISAQUAD PO CAPS
1.0000 | ORAL_CAPSULE | Freq: Every day | ORAL | Status: DC
  Administered 2024-03-04 – 2024-03-06 (×3): 1 via ORAL
  Filled 2024-03-04 (×3): qty 1

## 2024-03-04 NOTE — Progress Notes (Signed)
 Rounding Note    Patient Name: Brianna Mccarty Date of Encounter: 03/04/2024  [facility] Cardiologist: Euell Herrlich, MD   Subjective   No CP; mild dyspnea  Inpatient Medications    Scheduled Meds:  apixaban   5 mg Oral BID   diltiazem   180 mg Oral Daily   doxycycline   100 mg Oral Q12H   loratadine   10 mg Oral Daily   losartan   100 mg Oral Daily   mometasone -formoterol   2 puff Inhalation BID   predniSONE   10 mg Oral Q breakfast   sodium chloride  flush  3 mL Intravenous Q12H   Continuous Infusions:  PRN Meds: acetaminophen  **OR** acetaminophen , albuterol , furosemide , hydrALAZINE , metoprolol  tartrate, ondansetron  **OR** ondansetron  (ZOFRAN ) IV   Vital Signs    Vitals:   03/03/24 2003 03/04/24 0024 03/04/24 0526 03/04/24 0740  BP: (!) 133/96 (!) 126/97 128/75 (!) 122/100  Pulse: 86 96 (!) 105 97  Resp: 18 16 19 16   Temp: 97.9 F (36.6 C) 97.7 F (36.5 C) 97.7 F (36.5 C) (!) 97.5 F (36.4 C)  TempSrc: Oral Oral Oral Oral  SpO2: 96%  96% 97%  Weight:   72.2 kg   Height:        Intake/Output Summary (Last 24 hours) at 03/04/2024 0800 Last data filed at 03/04/2024 0500 Gross per 24 hour  Intake 300 ml  Output 1300 ml  Net -1000 ml      03/04/2024    5:26 AM 03/03/2024   11:44 AM 02/27/2024    7:59 PM  Last 3 Weights  Weight (lbs) 159 lb 3.2 oz 156 lb 8.4 oz 160 lb  Weight (kg) 72.213 kg 71 kg 72.576 kg      Telemetry    Atrial fibrillation rate controlled - Personally Reviewed   Physical Exam   GEN: No acute distress.   Neck: No JVD Cardiac: irregular, 2/6 systolic murmur Respiratory: Clear to auscultation bilaterally. GI: Soft, nontender, non-distended  MS: No edema Neuro:  Nonfocal  Psych: Normal affect   Labs    High Sensitivity Troponin:   Recent Labs  Lab 02/27/24 2103 03/03/24 0335 03/03/24 0607  TROPONINIHS 5 12 17      Chemistry Recent Labs  Lab 02/27/24 2103 03/03/24 0335 03/04/24 0236  NA 134* 132* 136  K 4.3  4.0 4.2  CL 102 98 102  CO2 25 23 24   GLUCOSE 139* 147* 109*  BUN 16 19 11   CREATININE 0.79 0.68 0.71  CALCIUM  8.6* 9.4 9.0  MG  --  1.6*  --   PROT 6.0*  --   --   ALBUMIN 3.6  --   --   AST 21  --   --   ALT 25  --   --   ALKPHOS 53  --   --   BILITOT 0.9  --   --   GFRNONAA >60 >60 >60  ANIONGAP 7 11 10      Hematology Recent Labs  Lab 02/27/24 2103 03/03/24 0335 03/04/24 0236  WBC 8.0 6.9 10.0  RBC 3.37* 3.76* 4.11  HGB 10.1* 11.1* 12.3  HCT 31.1* 34.3* 36.6  MCV 92.3 91.2 89.1  MCH 30.0 29.5 29.9  MCHC 32.5 32.4 33.6  RDW 14.3 13.5 13.2  PLT 158 197 232   Thyroid   Recent Labs  Lab 03/03/24 0335  TSH 1.047    BNP Recent Labs  Lab 03/03/24 0335  BNP 247.5*    DDimer  Recent Labs  Lab 03/03/24 [phone]  DDIMER 0.54*     Radiology    DG Chest Port 1 View Result Date: 03/03/2024 CLINICAL DATA:  Chest pain EXAM: PORTABLE CHEST 1 VIEW COMPARISON:  02/27/2024 FINDINGS: Lungs are symmetrically well expanded. Bibasilar peribronchial infiltrates have developed, suggestive of infectious bronchiolitis or aspiration in the acute setting. No pneumothorax. Trace bilateral pleural effusion. Stable cardiomegaly. Pulmonary vascularity is. No acute bone abnormality. IMPRESSION: 1. Interval development of bibasilar peribronchial infiltrates, suggestive of infectious bronchiolitis or aspiration in the acute setting. 2. Stable cardiomegaly Electronically Signed   By: Worthy Heads M.D.   On: 03/03/2024 05:15      Patient Profile     83 y.o. female with past medical history of hypertension, palpitations, bradycardia, moderate aortic stenosis, mitral regurgitation for evaluation of new onset atrial fibrillation/flutter. Echocardiogram February 2025 showed normal LV function, severe left atrial enlargement, moderate mitral regurgitation, moderate aortic stenosis with mean gradient 21 mmHg and aortic valve area 1 cm. Monitor April 2025 showed sinus rhythm with minimum heart rate  of 38 at 4:33 AM, occasional PVC, PACs.  Assessment & Plan    1 newly diagnosed atrial flutter-patient remains in atrial flutter this morning.  Continue apixaban .  Continue low-dose Cardizem .  Metoprolol  was discontinued previously due to potential exacerbation of asthma.  She continues with mild dyspnea this morning.  I think we should proceed with TEE guided cardioversion (she is unclear as to exact timing of onset of her symptoms).  Will keep n.p.o. and hopefully perform later today or tomorrow morning.  2 aortic stenosis-moderate on most recent echocardiogram.  She also has moderate mitral regurgitation.  Needs follow-up echocardiogram February 2026.  3 hypertension-patient's blood pressure is controlled.  Continue present medical regimen.  4 history of bradycardia-continue to follow on telemetry.  Heart rate is controlled at present.  5 history of asthma-managed by primary care.  For questions or updates, please contact [facility] Please consult [email or web address] for contact info under        Signed, [name], MD  03/04/2024, 8:00 AM

## 2024-03-04 NOTE — Care Management Obs Status (Signed)
 MEDICARE OBSERVATION STATUS NOTIFICATION   Patient Details  Name: Brianna Mccarty MRN: [id number] Date of Birth: October 30, 1941   Medicare Observation Status Notification Given:  Yes    Felix Host 03/04/2024, 2:35 PM

## 2024-03-04 NOTE — TOC CM/SW Note (Addendum)
 Transition of Care [facility]) - Inpatient Brief Assessment   Patient Details  Name: Brianna Mccarty MRN: [id number] Date of Birth: [birth year]  [facility] CM/SW Contact:    Jennett Model, RN Phone Number: 03/04/2024, 3:07 PM   Clinical Narrative: From home with spouse, has PCP and insurance on file, states has no HH services in place at this time, but she goes to [facility] for therapy for her back and she plans to continue this, has no DME at home.  States family member will transport them home at [employer] and family is support system, states gets medications from [facility] on [address]  Pta self ambulatory .  Patient gives this NCM permission to speak with her spouse and family.   Transition of Care Asessment: Insurance and Status: Insurance coverage has been reviewed Patient has primary care physician: Yes Home environment has been reviewed: lives with spouse and her daughter Prior level of function:: indep Prior/Current Home Services: Current home services (goes to [facility] for therapy for her back) Social Drivers of Health Review: SDOH reviewed no interventions necessary Readmission risk has been reviewed: Yes Transition of care needs: transition of care needs identified, TOC will continue to follow

## 2024-03-04 NOTE — Assessment & Plan Note (Addendum)
Continue blood pressure monitoring.  Continue with losartan.

## 2024-03-04 NOTE — Assessment & Plan Note (Addendum)
 Hyponatremia.,   Stable renal function with serum cr at 0,92 with K at 4,3 and serum bicarbonate at 25  Na 138 Mg 2.1   Continue follow up on renal function and electrolytes, keep K at 4 and Mg at 2.

## 2024-03-04 NOTE — Assessment & Plan Note (Signed)
 Continue with statin therapy.  ?

## 2024-03-04 NOTE — Hospital Course (Addendum)
 Brianna Mccarty was admitted to the hospital with the working diagnosis of atrial fibrillation with rapid ventricular response.   83 yo female with the past medical history of hypertension, hyperlipidemia, SVT, and carotid artery disease who presented with palpitations.  Patient was diagnosed with asthma exacerbation has outpatient and was treated with bronchodilator therapy, steroids and antibiotics. On the day of admission while sitting she had severe palpitations and chest discomfort. Her heart rate was 178 bpm, despite taking oral metoprolol . Patient called EMS and she was found in atrial fibrillation with RVR, 80 to 160 bpm, she received full dose aspirin  and diltiazem  in route to the hospital.  On her initial physical examination her blood pressure was 107/88, HR 101 and RR 21, 02 saturation 98%  Lungs with no wheezing or rales, heart with S1 and S2 present irregularly irregular with no gallops, rubs or murmurs, abdomen with no distention and no lower extremity edema.   Na 132, K 4,0 Cl 98, bicarbonate 23 glucose 147 bun 19 cr 0,68  Mg 1,6  BNP 247  High sensitive troponin 12 and 17  Wbc 6,9 hgb 11.1 plt 197  TSH 1,047  Sars covid 19 negative Respiratory viral panel negative  Urine analysis SG 1.012, negative protein, negative hgb and negative leukocytes.   Chest radiograph with hypoinflation, positive cardiomegaly with bilateral central interstitial infiltrates, no effusions.  EKG 101 bpm, left axis deviation, normal intervals, atrial flutter with variable block, rhythm with no significant ST segment or T wave changes.   Patient was placed on diltiazem  and apixaban  Cardiology was consulted and recommendation for direct current cardioversion.  05/02 successful direct current cardioversion, converting back to sinus rhythm.  05/03 patient continue on sinus rhythm, plan to continue diltiazem  for rate control and anticoagulation with apixaban  Follow up as outpatient.

## 2024-03-04 NOTE — Progress Notes (Signed)
 Informed Consent   Shared Decision Making/Informed Consent{    The risks [stroke, cardiac arrhythmias rarely resulting in the need for a temporary or permanent pacemaker, skin irritation or burns, esophageal damage, perforation (1:10,000 risk), bleeding, pharyngeal hematoma as well as other potential complications associated with conscious sedation including aspiration, arrhythmia, respiratory failure and death], benefits (treatment guidance, restoration of normal sinus rhythm, diagnostic support) and alternatives of a transesophageal echocardiogram guided cardioversion were discussed in detail with Brianna Mccarty and she is willing to proceed.      Pt ate today will plan tomorrow.

## 2024-03-04 NOTE — Assessment & Plan Note (Addendum)
 Patient has converted to sinus rhythm, plan to continue diltiazem  for rate control.    Continue anticoagulation with apixaban .  Continue telemetry monitoring

## 2024-03-04 NOTE — Plan of Care (Signed)
   Problem: Education: Goal: Knowledge of General Education information will improve Description Including pain rating scale, medication(s)/side effects and non-pharmacologic comfort measures Outcome: Progressing

## 2024-03-04 NOTE — Assessment & Plan Note (Signed)
 No signs of acute exacerbation, will continue as needed bronchodilator therapy.

## 2024-03-04 NOTE — Assessment & Plan Note (Addendum)
 Echocardiogram with preserved LV systolic function with EF 60 to 65%, RV systolic function preserved, LA with severe dilatation, moderate mitral regurgitation, moderate aortic valve stenosis. RVSP 31.5 mmHg.   Patient had one low dose of oral furosemide  20 mg with improvement in her volume status.   Plan to continue losartan  for blood pressure control.  Diltiazem  for atrial fibrillation rate control.  Continue as needed furosemide .  Outpatient follow up with cardiology for valvular heart disease.

## 2024-03-04 NOTE — Progress Notes (Signed)
 Progress Note   Patient: Brianna Mccarty ZOX:[id number] DOB: 1941-10-01 DOA: 03/03/2024     0 DOS: the patient was seen and examined on 03/04/2024   Brief hospital course: Mrs. Roose was admitted to the hospital with the working diagnosis of atrial fibrillation with rapid ventricular response.   83 yo female with the past medical history of hypertension, hyperlipidemia, SVT, and carotid artery disease who presented with palpitations.  Patient was diagnosed with asthma exacerbation has outpatient and was treated with bronchodilator therapy, steroids and antibiotics. On the day of admission while sitting she had severe palpitations and chest discomfort. Her heart rate was 178 bpm, despite taking oral metoprolol . Patient called EMS and she was found in atrial fibrillation with RVR, 80 to 160 bpm, she received full dose aspirin  and diltiazem  in route to the hospital.  On her initial physical examination her blood pressure was 107/88, HR 101 and RR 21, 02 saturation 98%  Lungs with no wheezing or rales, heart with S1 and S2 present irregularly irregular with no gallops, rubs or murmurs, abdomen with no distention and no lower extremity edema.   Na 132, K 4,0 Cl 98, bicarbonate 23 glucose 147 bun 19 cr 0,68  Mg 1,6  BNP 247  High sensitive troponin 12 and 17  Wbc 6,9 hgb 11.1 plt 197  TSH 1,047  Sars covid 19 negative Respiratory viral panel negative  Urine analysis SG 1.012, negative protein, negative hgb and negative leukocytes.   Chest radiograph with hypoinflation, positive cardiomegaly with bilateral central interstitial infiltrates, no effusions.  EKG 101 bpm, left axis deviation, normal intervals, atrial fibrillation rhythm with no significant ST segment or T wave changes.   Patient was placed on diltiazem  and apixaban  Cardiology was consulted and recommendation for direct current cardioversion.   Assessment and Plan: * Atrial flutter (HCC) Continue RVR with rate up to 130 bpm.  Blood  pressure has been stable 120 mmHg systolic.   Plan to continue AV blockade with diltiazem , if persistent RVR to consider transition to IV infusion.  Continue anticoagulation with apixaban .  Plan for direct current cardioversion today. (TEE)   Acute on chronic diastolic CHF (congestive heart failure) (HCC) Echocardiogram with preserved LV systolic function with EF 60 to 65%, RV systolic function preserved, LA with severe dilatation, moderate mitral regurgitation, moderate aortic valve stenosis. RVSP 31.5   Improved volume status. Plan to continue with oral furosemide   Continue blood pressure monitoring, continue with losartan .   Asthma No signs of acute exacerbation, will continue as needed bronchodilator therapy  Oxymetry monitoring Stop systemic corticosteroids.   Essential hypertension Continue blood pressure monitoring  Continue with losartan    Pure hypercholesterolemia Continue with statin therapy   Hypomagnesemia Hyponatremia.,   Patient had 4 g mag sulfate on admission.  Renal function with stable serum cr at 0,71 K is 4,2 and serum bicarbonate 24  Na 136   Follow up renal function and electrolytes in am, keep K above 4 and Mg above 2       Subjective: Patient with nausea this morning improved with antiemetics, no chest pain or dyspnea, at the time of my examination no palpitations   Physical Exam: Vitals:   03/04/24 0526 03/04/24 0740 03/04/24 0844 03/04/24 1110  BP: 128/75 (!) 122/100  (!) 127/101  Pulse: (!) 105 97  (!) 58  Resp: 19 16  19   Temp: 97.7 F (36.5 C) (!) 97.5 F (36.4 C)  98.2 F (36.8 C)  TempSrc: Oral Oral  Oral  SpO2: 96% 97% 96% 95%  Weight: 72.2 kg     Height:       Neurology awake and alert ENT with mild pallor with no icterus Cardiovascular with S1 and S2 present, irregularly irregular with no gallops, rubs or murmurs Respiratory with no wheezing or rhonchi, no rales Abdomen with no distention  No lower extremity edema  Data  Reviewed:    Family Communication: no family at the bedside   Disposition: Status is: Observation The patient will require care spanning > 2 midnights and should be moved to inpatient because: atrial fibrillation note controlled.   Planned Discharge Destination: Home     Author: Albertus Alt, MD 03/04/2024 2:06 PM  For on call review [email or web address].

## 2024-03-05 ENCOUNTER — Other Ambulatory Visit: Payer: Self-pay

## 2024-03-05 ENCOUNTER — Encounter (HOSPITAL_COMMUNITY)
Admission: EM | Disposition: A | Discharge status: Home / Self Care | Payer: Self-pay | Source: Home / Self Care | Attending: Internal Medicine

## 2024-03-05 ENCOUNTER — Observation Stay (HOSPITAL_COMMUNITY)

## 2024-03-05 ENCOUNTER — Observation Stay (HOSPITAL_COMMUNITY): Admitting: Anesthesiology

## 2024-03-05 DIAGNOSIS — J4530 Mild persistent asthma, uncomplicated: Secondary | ICD-10-CM | POA: Diagnosis not present

## 2024-03-05 DIAGNOSIS — N3010 Interstitial cystitis (chronic) without hematuria: Secondary | ICD-10-CM | POA: Diagnosis not present

## 2024-03-05 DIAGNOSIS — M810 Age-related osteoporosis without current pathological fracture: Secondary | ICD-10-CM | POA: Diagnosis not present

## 2024-03-05 DIAGNOSIS — I4891 Unspecified atrial fibrillation: Secondary | ICD-10-CM | POA: Diagnosis not present

## 2024-03-05 DIAGNOSIS — I340 Nonrheumatic mitral (valve) insufficiency: Secondary | ICD-10-CM

## 2024-03-05 DIAGNOSIS — I080 Rheumatic disorders of both mitral and aortic valves: Secondary | ICD-10-CM | POA: Diagnosis not present

## 2024-03-05 DIAGNOSIS — G4730 Sleep apnea, unspecified: Secondary | ICD-10-CM

## 2024-03-05 DIAGNOSIS — E7800 Pure hypercholesterolemia, unspecified: Secondary | ICD-10-CM | POA: Diagnosis not present

## 2024-03-05 DIAGNOSIS — I471 Supraventricular tachycardia, unspecified: Secondary | ICD-10-CM | POA: Diagnosis not present

## 2024-03-05 DIAGNOSIS — I493 Ventricular premature depolarization: Secondary | ICD-10-CM | POA: Diagnosis not present

## 2024-03-05 DIAGNOSIS — Z1152 Encounter for screening for COVID-19: Secondary | ICD-10-CM | POA: Diagnosis not present

## 2024-03-05 DIAGNOSIS — E871 Hypo-osmolality and hyponatremia: Secondary | ICD-10-CM | POA: Diagnosis not present

## 2024-03-05 DIAGNOSIS — Z79899 Other long term (current) drug therapy: Secondary | ICD-10-CM | POA: Diagnosis not present

## 2024-03-05 DIAGNOSIS — Z800 Family history of malignant neoplasm of digestive organs: Secondary | ICD-10-CM | POA: Diagnosis not present

## 2024-03-05 DIAGNOSIS — I5033 Acute on chronic diastolic (congestive) heart failure: Secondary | ICD-10-CM | POA: Diagnosis not present

## 2024-03-05 DIAGNOSIS — I2510 Atherosclerotic heart disease of native coronary artery without angina pectoris: Secondary | ICD-10-CM

## 2024-03-05 DIAGNOSIS — K219 Gastro-esophageal reflux disease without esophagitis: Secondary | ICD-10-CM | POA: Diagnosis not present

## 2024-03-05 DIAGNOSIS — I350 Nonrheumatic aortic (valve) stenosis: Secondary | ICD-10-CM

## 2024-03-05 DIAGNOSIS — J45909 Unspecified asthma, uncomplicated: Secondary | ICD-10-CM | POA: Diagnosis not present

## 2024-03-05 DIAGNOSIS — Z801 Family history of malignant neoplasm of trachea, bronchus and lung: Secondary | ICD-10-CM | POA: Diagnosis not present

## 2024-03-05 DIAGNOSIS — J45901 Unspecified asthma with (acute) exacerbation: Secondary | ICD-10-CM | POA: Diagnosis not present

## 2024-03-05 DIAGNOSIS — I472 Ventricular tachycardia, unspecified: Secondary | ICD-10-CM | POA: Diagnosis not present

## 2024-03-05 DIAGNOSIS — I10 Essential (primary) hypertension: Secondary | ICD-10-CM | POA: Diagnosis not present

## 2024-03-05 DIAGNOSIS — Z7983 Long term (current) use of bisphosphonates: Secondary | ICD-10-CM | POA: Diagnosis not present

## 2024-03-05 DIAGNOSIS — Z7951 Long term (current) use of inhaled steroids: Secondary | ICD-10-CM | POA: Diagnosis not present

## 2024-03-05 DIAGNOSIS — I110 Hypertensive heart disease with heart failure: Secondary | ICD-10-CM | POA: Diagnosis not present

## 2024-03-05 DIAGNOSIS — I4892 Unspecified atrial flutter: Secondary | ICD-10-CM | POA: Diagnosis not present

## 2024-03-05 DIAGNOSIS — Z85820 Personal history of malignant melanoma of skin: Secondary | ICD-10-CM | POA: Diagnosis not present

## 2024-03-05 DIAGNOSIS — I484 Atypical atrial flutter: Secondary | ICD-10-CM | POA: Diagnosis not present

## 2024-03-05 HISTORY — PX: CARDIOVERSION: EP1203

## 2024-03-05 HISTORY — PX: TRANSESOPHAGEAL ECHOCARDIOGRAM (CATH LAB): EP1270

## 2024-03-05 LAB — BASIC METABOLIC PANEL WITH GFR
Anion gap: 7 (ref 5–15)
BUN: 19 mg/dL (ref 8–23)
CO2: 25 mmol/L (ref 22–32)
Calcium: 8.8 mg/dL — ABNORMAL LOW (ref 8.9–10.3)
Chloride: 106 mmol/L (ref 98–111)
Creatinine, Ser: 0.92 mg/dL (ref 0.44–1.00)
GFR, Estimated: >60 mL/min (ref >60)
Glucose, Bld: 120 mg/dL — ABNORMAL HIGH (ref 70–99)
Potassium: 4.3 mmol/L (ref 3.5–5.1)
Sodium: 138 mmol/L (ref 135–145)

## 2024-03-05 LAB — MAGNESIUM: Magnesium: 2.1 mg/dL (ref 1.7–2.4)

## 2024-03-05 SURGERY — TRANSESOPHAGEAL ECHOCARDIOGRAM (TEE) (CATHLAB)
Anesthesia: Monitor Anesthesia Care

## 2024-03-05 MED ORDER — LIDOCAINE 2% (20 MG/ML) 5 ML SYRINGE
INTRAMUSCULAR | Status: DC | PRN
Start: 2024-03-05 — End: 2024-03-05
  Administered 2024-03-05: 100 mg via INTRAVENOUS

## 2024-03-05 MED ORDER — APIXABAN 5 MG PO TABS
ORAL_TABLET | ORAL | Status: AC
  Filled 2024-03-05: qty 1

## 2024-03-05 MED ORDER — PHENYLEPHRINE HCL-NACL 20-0.9 MG/250ML-% IV SOLN
INTRAVENOUS | Status: DC | PRN
  Administered 2024-03-05: 20 ug/min via INTRAVENOUS

## 2024-03-05 MED ORDER — MELATONIN 3 MG PO TABS
3.0000 mg | ORAL_TABLET | Freq: Every day | ORAL | Status: DC
  Administered 2024-03-05: 3 mg via ORAL
  Filled 2024-03-05: qty 1

## 2024-03-05 MED ORDER — SODIUM CHLORIDE 0.9 % IV SOLN: INTRAVENOUS | Status: DC

## 2024-03-05 MED ORDER — PROPOFOL 10 MG/ML IV BOLUS
INTRAVENOUS | Status: DC | PRN
  Administered 2024-03-05: 40 mg via INTRAVENOUS
  Administered 2024-03-05 (×3): 30 mg via INTRAVENOUS
  Administered 2024-03-05: 40 mg via INTRAVENOUS

## 2024-03-05 MED ORDER — PROPOFOL 500 MG/50ML IV EMUL
INTRAVENOUS | Status: DC | PRN
  Administered 2024-03-05: 75 ug/kg/min via INTRAVENOUS

## 2024-03-05 SURGICAL SUPPLY — 1 items: PAD DEFIB RADIO PHYSIO CONN (PAD) ×1 IMPLANT

## 2024-03-05 NOTE — Progress Notes (Signed)
 Echocardiogram Echocardiogram Transesophageal has been performed.  Brianna Mccarty 03/05/2024, 11:36 AM

## 2024-03-05 NOTE — Progress Notes (Addendum)
 Progress Note   Patient: Brianna Mccarty ZOX:[id number] DOB: 08/09/1941 DOA: 03/03/2024     0 DOS: the patient was seen and examined on 03/05/2024   Brief hospital course: Brianna Mccarty was admitted to the hospital with the working diagnosis of atrial fibrillation with rapid ventricular response.   83 yo female with the past medical history of hypertension, hyperlipidemia, SVT, and carotid artery disease who presented with palpitations.  Patient was diagnosed with asthma exacerbation has outpatient and was treated with bronchodilator therapy, steroids and antibiotics. On the day of admission while sitting she had severe palpitations and chest discomfort. Her heart rate was 178 bpm, despite taking oral metoprolol . Patient called EMS and she was found in atrial fibrillation with RVR, 80 to 160 bpm, she received full dose aspirin  and diltiazem  in route to the hospital.  On her initial physical examination her blood pressure was 107/88, HR 101 and RR 21, 02 saturation 98%  Lungs with no wheezing or rales, heart with S1 and S2 present irregularly irregular with no gallops, rubs or murmurs, abdomen with no distention and no lower extremity edema.   Na 132, K 4,0 Cl 98, bicarbonate 23 glucose 147 bun 19 cr 0,68  Mg 1,6  BNP 247  High sensitive troponin 12 and 17  Wbc 6,9 hgb 11.1 plt 197  TSH 1,047  Sars covid 19 negative Respiratory viral panel negative  Urine analysis SG 1.012, negative protein, negative hgb and negative leukocytes.   Chest radiograph with hypoinflation, positive cardiomegaly with bilateral central interstitial infiltrates, no effusions.  EKG 101 bpm, left axis deviation, normal intervals, atrial flutter with variable block, rhythm with no significant ST segment or T wave changes.   Patient was placed on diltiazem  and apixaban  Cardiology was consulted and recommendation for direct current cardioversion.  05/02 successful direct current cardioversion, converting back to sinus  rhythm.   Assessment and Plan: * Atrial flutter (HCC) Patient has converted to sinus rhythm, plan to continue diltiazem  for rate control.    Continue anticoagulation with apixaban .  Continue telemetry monitoring   Acute on chronic diastolic CHF (congestive heart failure) (HCC) Echocardiogram with preserved LV systolic function with EF 60 to 65%, RV systolic function preserved, LA with severe dilatation, moderate mitral regurgitation, moderate aortic valve stenosis. RVSP 31.5   Improved volume status.  Continue blood pressure monitoring, continue with losartan .   Asthma No signs of acute exacerbation, will continue as needed bronchodilator therapy  Oxymetry monitoring Stop systemic corticosteroids.   Essential hypertension Continue blood pressure monitoring  Continue with losartan    Pure hypercholesterolemia Continue with statin therapy   Hypomagnesemia Hyponatremia.,   Stable renal function with serum cr at 0,92 with K at 4,3 and serum bicarbonate at 25  Na 138 Mg 2.1   Continue follow up on renal function and electrolytes, keep K at 4 and Mg at 2.     Subjective: Patient with no chest pain or dyspnea, no palpitation, no nausea or vomiting. Had cardioversion this morning   Physical Exam: Vitals:   03/05/24 1142 03/05/24 1145 03/05/24 1150 03/05/24 1200  BP: 108/60 (!) 111/59 120/71 117/63  Pulse:  (!) 52 (!) 55 (!) 49  Resp:  18 16 16   Temp: 97.8 F (36.6 C)   97.6 F (36.4 C)  TempSrc: Temporal   Temporal  SpO2:  96% 94% 94%  Weight:      Height:       Neurology awake and alert ENT with mild pallor Cardiovascular with S1 and  S2, regular with no rubs, no gallops,positive systolic murmur at the apex Respiratory with no rales or wheezing, no rhonchi  Abdomen with no distention  No lower extremity edema  Data Reviewed:    Family Communication: I spoke with patient's family at the bedside, we talked in detail about patient's condition, plan of care and  prognosis and all questions were addressed.   Disposition: Status is: Observation The patient remains OBS appropriate and will d/c before 2 midnights.  Planned Discharge Destination: Home     Author: Albertus Alt, MD 03/05/2024 2:56 PM  For on call review [email or web address].

## 2024-03-05 NOTE — H&P (View-Only) (Signed)
 Rounding Note    Patient Name: Brianna Mccarty Date of Encounter: 03/05/2024  [facility] Cardiologist: Euell Herrlich, MD   Subjective   Mild chest tightness earlier; none at present; no dyspnea  Inpatient Medications    Scheduled Meds:  acidophilus  1 capsule Oral Daily   apixaban   5 mg Oral BID   diltiazem   180 mg Oral Daily   loratadine   10 mg Oral Daily   losartan   100 mg Oral Daily   mometasone -formoterol   2 puff Inhalation BID   sodium chloride  flush  3 mL Intravenous Q12H   Continuous Infusions:  sodium chloride  20 mL/hr at 03/05/24 0436   PRN Meds: acetaminophen  **OR** acetaminophen , albuterol , furosemide , hydrALAZINE , ondansetron  **OR** ondansetron  (ZOFRAN ) IV   Vital Signs    Vitals:   03/05/24 0036 03/05/24 0516 03/05/24 0540 03/05/24 0729  BP: (!) 115/97 (!) 148/131 (!) 121/90   Pulse: 100     Resp: 18 18    Temp: 98.1 F (36.7 C) 98.1 F (36.7 C)    TempSrc: Oral Oral    SpO2: 94% 98%  98%  Weight:  71.9 kg    Height:        Intake/Output Summary (Last 24 hours) at 03/05/2024 0821 Last data filed at 03/04/2024 2107 Gross per 24 hour  Intake 600 ml  Output --  Net 600 ml      03/05/2024    5:16 AM 03/04/2024    5:26 AM 03/03/2024   11:44 AM  Last 3 Weights  Weight (lbs) 158 lb 8 oz 159 lb 3.2 oz 156 lb 8.4 oz  Weight (kg) 71.895 kg 72.213 kg 71 kg      Telemetry    Atrial fibrillation rate controlled - Personally Reviewed   Physical Exam   GEN: NAD Neck: supple Cardiac: irregular, 2/6 systolic murmur, no rub Respiratory: CTA GI: Soft, NT/ND  MS: No edema Neuro:  Grossly intact Psych: Normal affect   Labs    High Sensitivity Troponin:   Recent Labs  Lab 02/27/24 2103 03/03/24 0335 03/03/24 0607  TROPONINIHS 5 12 17      Chemistry Recent Labs  Lab 02/27/24 2103 03/03/24 0335 03/04/24 0236 03/05/24 0259  NA 134* 132* 136 138  K 4.3 4.0 4.2 4.3  CL 102 98 102 106  CO2 25 23 24 25   GLUCOSE 139* 147* 109*  120*  BUN 16 19 11 19   CREATININE 0.79 0.68 0.71 0.92  CALCIUM  8.6* 9.4 9.0 8.8*  MG  --  1.6*  --  2.1  PROT 6.0*  --   --   --   ALBUMIN 3.6  --   --   --   AST 21  --   --   --   ALT 25  --   --   --   ALKPHOS 53  --   --   --   BILITOT 0.9  --   --   --   GFRNONAA >60 >60 >60 >60  ANIONGAP 7 11 10 7      Hematology Recent Labs  Lab 02/27/24 2103 03/03/24 0335 03/04/24 0236  WBC 8.0 6.9 10.0  RBC 3.37* 3.76* 4.11  HGB 10.1* 11.1* 12.3  HCT 31.1* 34.3* 36.6  MCV 92.3 91.2 89.1  MCH 30.0 29.5 29.9  MCHC 32.5 32.4 33.6  RDW 14.3 13.5 13.2  PLT 158 197 232   Thyroid   Recent Labs  Lab 03/03/24 0335  TSH 1.047    BNP  Recent Labs  Lab 03/03/24 0335  BNP 247.5*    DDimer  Recent Labs  Lab 03/03/24 0607  DDIMER 0.54*     Radiology    No results found.     Patient Profile     83 y.o. female with past medical history of hypertension, palpitations, bradycardia, moderate aortic stenosis, mitral regurgitation for evaluation of new onset atrial fibrillation/flutter. Echocardiogram February 2025 showed normal LV function, severe left atrial enlargement, moderate mitral regurgitation, moderate aortic stenosis with mean gradient 21 mmHg and aortic valve area 1 cm. Monitor April 2025 showed sinus rhythm with minimum heart rate of 38 at 4:33 AM, occasional PVC, PACs.  Assessment & Plan    1 newly diagnosed atrial flutter-patient remains in atrial flutter.  Continue Cardizem  for rate control.  Metoprolol  discontinued previously secondary to potentially exacerbating her asthma.  Continue apixaban .  Plan is to proceed with TEE guided cardioversion today.    2 aortic stenosis/mitral regurgitation-follow-up echocardiogram February 2026.  3 hypertension-patient's blood pressure is controlled.  Continue present medical regimen.  4 history of bradycardia-continue to follow on telemetry.  Heart rate is controlled at present.  5 history of asthma-managed by primary  care.  If sinus reestablished could potentially discharge later today with outpatient follow-up.  For questions or updates, please contact [facility] Please consult [email or web address] for contact info under        Signed, [name], MD  03/05/2024, 8:21 AM

## 2024-03-05 NOTE — Transfer of Care (Signed)
 Immediate Anesthesia Transfer of Care Note  Patient: Brianna Mccarty  Procedure(s) Performed: TRANSESOPHAGEAL ECHOCARDIOGRAM CARDIOVERSION  Patient Location: PACU and Cath Lab  Anesthesia Type:MAC  Level of Consciousness: awake, oriented, and patient cooperative  Airway & Oxygen  Therapy: Patient Spontanous Breathing  Post-op Assessment: Report given to RN and Post -op Vital signs reviewed and stable  Post vital signs: Reviewed and stable  Last Vitals:  Vitals Value Taken Time  BP    Temp    Pulse    Resp    SpO2      Last Pain:  Vitals:   03/05/24 0920  TempSrc:   PainSc: 0-No pain         Complications: No notable events documented.

## 2024-03-05 NOTE — Progress Notes (Addendum)
 Rounding Note    Patient Name: Brianna Mccarty Date of Encounter: 03/05/2024  [facility] Cardiologist: Euell Herrlich, MD   Subjective   Mild chest tightness earlier; none at present; no dyspnea  Inpatient Medications    Scheduled Meds:  acidophilus  1 capsule Oral Daily   apixaban   5 mg Oral BID   diltiazem   180 mg Oral Daily   loratadine   10 mg Oral Daily   losartan   100 mg Oral Daily   mometasone -formoterol   2 puff Inhalation BID   sodium chloride  flush  3 mL Intravenous Q12H   Continuous Infusions:  sodium chloride  20 mL/hr at 03/05/24 0436   PRN Meds: acetaminophen  **OR** acetaminophen , albuterol , furosemide , hydrALAZINE , ondansetron  **OR** ondansetron  (ZOFRAN ) IV   Vital Signs    Vitals:   03/05/24 0036 03/05/24 0516 03/05/24 0540 03/05/24 0729  BP: (!) 115/97 (!) 148/131 (!) 121/90   Pulse: 100     Resp: 18 18    Temp: 98.1 F (36.7 C) 98.1 F (36.7 C)    TempSrc: Oral Oral    SpO2: 94% 98%  98%  Weight:  71.9 kg    Height:        Intake/Output Summary (Last 24 hours) at 03/05/2024 0821 Last data filed at 03/04/2024 2107 Gross per 24 hour  Intake 600 ml  Output --  Net 600 ml      03/05/2024    5:16 AM 03/04/2024    5:26 AM 03/03/2024   11:44 AM  Last 3 Weights  Weight (lbs) 158 lb 8 oz 159 lb 3.2 oz 156 lb 8.4 oz  Weight (kg) 71.895 kg 72.213 kg 71 kg      Telemetry    Atrial fibrillation rate controlled - Personally Reviewed   Physical Exam   GEN: NAD Neck: supple Cardiac: irregular, 2/6 systolic murmur, no rub Respiratory: CTA GI: Soft, NT/ND  MS: No edema Neuro:  Grossly intact Psych: Normal affect   Labs    High Sensitivity Troponin:   Recent Labs  Lab 02/27/24 2103 03/03/24 0335 03/03/24 0607  TROPONINIHS 5 12 17      Chemistry Recent Labs  Lab 02/27/24 2103 03/03/24 0335 03/04/24 0236 03/05/24 0259  NA 134* 132* 136 138  K 4.3 4.0 4.2 4.3  CL 102 98 102 106  CO2 25 23 24 25   GLUCOSE 139* 147* 109*  120*  BUN 16 19 11 19   CREATININE 0.79 0.68 0.71 0.92  CALCIUM  8.6* 9.4 9.0 8.8*  MG  --  1.6*  --  2.1  PROT 6.0*  --   --   --   ALBUMIN 3.6  --   --   --   AST 21  --   --   --   ALT 25  --   --   --   ALKPHOS 53  --   --   --   BILITOT 0.9  --   --   --   GFRNONAA >60 >60 >60 >60  ANIONGAP 7 11 10 7      Hematology Recent Labs  Lab 02/27/24 2103 03/03/24 0335 03/04/24 0236  WBC 8.0 6.9 10.0  RBC 3.37* 3.76* 4.11  HGB 10.1* 11.1* 12.3  HCT 31.1* 34.3* 36.6  MCV 92.3 91.2 89.1  MCH 30.0 29.5 29.9  MCHC 32.5 32.4 33.6  RDW 14.3 13.5 13.2  PLT 158 197 232   Thyroid   Recent Labs  Lab 03/03/24 0335  TSH 1.047    BNP  Recent Labs  Lab 03/03/24 0335  BNP 247.5*    DDimer  Recent Labs  Lab 03/03/24 0607  DDIMER 0.54*     Radiology    No results found.     Patient Profile     83 y.o. female with past medical history of hypertension, palpitations, bradycardia, moderate aortic stenosis, mitral regurgitation for evaluation of new onset atrial fibrillation/flutter. Echocardiogram February 2025 showed normal LV function, severe left atrial enlargement, moderate mitral regurgitation, moderate aortic stenosis with mean gradient 21 mmHg and aortic valve area 1 cm. Monitor April 2025 showed sinus rhythm with minimum heart rate of 38 at 4:33 AM, occasional PVC, PACs.  Assessment & Plan    1 newly diagnosed atrial flutter-patient remains in atrial flutter.  Continue Cardizem  for rate control.  Metoprolol  discontinued previously secondary to potentially exacerbating her asthma.  Continue apixaban .  Plan is to proceed with TEE guided cardioversion today.    2 aortic stenosis/mitral regurgitation-follow-up echocardiogram February 2026.  3 hypertension-patient's blood pressure is controlled.  Continue present medical regimen.  4 history of bradycardia-continue to follow on telemetry.  Heart rate is controlled at present.  5 history of asthma-managed by primary  care.  If sinus reestablished could potentially discharge later today with outpatient follow-up.  For questions or updates, please contact [facility] Please consult [email or web address] for contact info under        Signed, [name], MD  03/05/2024, 8:21 AM

## 2024-03-05 NOTE — Anesthesia Preprocedure Evaluation (Addendum)
 Anesthesia Evaluation  Patient identified by MRN, date of birth, ID band Patient awake    Reviewed: Allergy & Precautions, NPO status , Patient's Chart, lab work & pertinent test results  Airway Mallampati: III  TM Distance: >3 FB Neck ROM: Full    Dental no notable dental hx.    Pulmonary asthma , sleep apnea    Pulmonary exam normal        Cardiovascular hypertension, Pt. on medications + CAD and +CHF  + dysrhythmias Atrial Fibrillation + Valvular Problems/Murmurs AS and MR  Rhythm:Irregular Rate:Tachycardia     Neuro/Psych  Headaches  negative psych ROS   GI/Hepatic negative GI ROS, Neg liver ROS,,,  Endo/Other  negative endocrine ROS    Renal/GU negative Renal ROS     Musculoskeletal  (+) Arthritis ,    Abdominal   Peds  Hematology negative hematology ROS (+)   Anesthesia Other Findings A-fib  Reproductive/Obstetrics                             Anesthesia Physical Anesthesia Plan  ASA: 4  Anesthesia Plan: MAC   Post-op Pain Management:    Induction:   PONV Risk Score and Plan: 2 and Propofol  infusion and Treatment may vary due to age or medical condition  Airway Management Planned: Nasal Cannula  Additional Equipment:   Intra-op Plan:   Post-operative Plan:   Informed Consent: I have reviewed the patients History and Physical, chart, labs and discussed the procedure including the risks, benefits and alternatives for the proposed anesthesia with the patient or authorized representative who has indicated his/her understanding and acceptance.     Dental advisory given  Plan Discussed with: CRNA  Anesthesia Plan Comments:        Anesthesia Quick Evaluation

## 2024-03-05 NOTE — Interval H&P Note (Signed)
 History and Physical Interval Note:  03/05/2024 9:23 AM  Brianna Mccarty  has presented today for surgery, with the diagnosis of afib.  The various methods of treatment have been discussed with the patient and family. After consideration of risks, benefits and other options for treatment, the patient has consented to  Procedure(s): TRANSESOPHAGEAL ECHOCARDIOGRAM (N/A) CARDIOVERSION (N/A) as a surgical intervention.  The patient's history has been reviewed, patient examined, no change in status, stable for surgery.  I have reviewed the patient's chart and labs.  Questions were answered to the patient's satisfaction.     Deon Ivey A Tevis Dunavan

## 2024-03-05 NOTE — CV Procedure (Signed)
 INDICATIONS: afib  PROCEDURE:   Informed consent was obtained prior to the procedure. The risks, benefits and alternatives for the procedure were discussed and the patient comprehended these risks.  Risks include, but are not limited to, cough, sore throat, vomiting, nausea, somnolence, esophageal and stomach trauma or perforation, bleeding, low blood pressure, aspiration, pneumonia, infection, trauma to the teeth and death.    After a procedural time-out, the oropharynx was anesthetized with 20% benzocaine spray.   During this procedure the patient was administered propofol  per anesthesia.  The patient's heart rate, blood pressure, and oxygen  saturation were monitored continuously during the procedure. The period of conscious sedation was 25 minutes, of which I was present face-to-face 100% of this time.  The transesophageal probe was inserted in the esophagus and stomach without difficulty and multiple views were obtained.  The patient was kept under observation until the patient left the procedure room.  The patient left the procedure room in stable condition.   Agitated microbubble saline contrast was administered.  COMPLICATIONS:    There were no immediate complications.  FINDINGS:   FORMAL ECHOCARDIOGRAM REPORT PENDING Normal biventricular function. Moderate MR, degenerative MV Moderate AS, MG 21 mmHg No LAA thrombus No PFO  Procedure: Electrical Cardioversion Indications:  Atrial Fibrillation  Procedure Details:  Consent: Risks of procedure as well as the alternatives and risks of each were explained to the (patient/caregiver).  Consent for procedure obtained.  Time Out: Verified patient identification, verified procedure, site/side was marked, verified correct patient position, special equipment/implants available, medications/allergies/relevent history reviewed, required imaging and test results available. PERFORMED.  Patient placed on cardiac monitor, pulse oximetry,  supplemental oxygen  as necessary.  Sedation given:  propofol  per anesthesia Pacer pads placed anterior and posterior chest.  Cardioverted 1 time(s).  Cardioversion with synchronized biphasic 200J shock.  Evaluation: Findings: Post procedure EKG shows: NSR. She did have some brief bradycardia with some junctional beats and atrial runs, but stabilized to SR. Complications: None Patient did tolerate procedure well.  Time Spent Directly with the Patient:  45 minutes   Coreon Simkins A Alyssandra Hulsebus 03/05/2024, 11:34 AM

## 2024-03-06 ENCOUNTER — Other Ambulatory Visit (HOSPITAL_COMMUNITY): Payer: Self-pay

## 2024-03-06 ENCOUNTER — Encounter (HOSPITAL_COMMUNITY): Payer: Self-pay | Admitting: Internal Medicine

## 2024-03-06 DIAGNOSIS — I4892 Unspecified atrial flutter: Secondary | ICD-10-CM | POA: Diagnosis not present

## 2024-03-06 DIAGNOSIS — I10 Essential (primary) hypertension: Secondary | ICD-10-CM | POA: Diagnosis not present

## 2024-03-06 DIAGNOSIS — J4530 Mild persistent asthma, uncomplicated: Secondary | ICD-10-CM | POA: Diagnosis not present

## 2024-03-06 DIAGNOSIS — I5033 Acute on chronic diastolic (congestive) heart failure: Secondary | ICD-10-CM | POA: Diagnosis not present

## 2024-03-06 DIAGNOSIS — I4891 Unspecified atrial fibrillation: Secondary | ICD-10-CM

## 2024-03-06 LAB — BASIC METABOLIC PANEL WITH GFR
Anion gap: 8 (ref 5–15)
BUN: 17 mg/dL (ref 8–23)
CO2: 26 mmol/L (ref 22–32)
Calcium: 9.1 mg/dL (ref 8.9–10.3)
Chloride: 104 mmol/L (ref 98–111)
Creatinine, Ser: 0.79 mg/dL (ref 0.44–1.00)
GFR, Estimated: >60 mL/min (ref >60)
Glucose, Bld: 100 mg/dL — ABNORMAL HIGH (ref 70–99)
Potassium: 4.2 mmol/L (ref 3.5–5.1)
Sodium: 138 mmol/L (ref 135–145)

## 2024-03-06 LAB — ECHO TEE
AR max vel: 0.74 cm2
AV Area VTI: 0.7 cm2
AV Area mean vel: 0.6 cm2
AV Mean grad: 21 mmHg
AV Peak grad: 33.2 mmHg
Ao pk vel: 2.88 m/s

## 2024-03-06 LAB — MAGNESIUM: Magnesium: 2 mg/dL (ref 1.7–2.4)

## 2024-03-06 MED ORDER — APIXABAN 5 MG PO TABS
5.0000 mg | ORAL_TABLET | Freq: Two times a day (BID) | ORAL | 0 refills | Status: DC
  Filled 2024-03-06: qty 60, 30d supply, fill #0

## 2024-03-06 MED ORDER — DILTIAZEM HCL ER COATED BEADS 180 MG PO CP24
180.0000 mg | ORAL_CAPSULE | Freq: Every day | ORAL | 0 refills | Status: DC
  Filled 2024-03-06: qty 30, 30d supply, fill #0

## 2024-03-06 NOTE — Discharge Summary (Addendum)
 Physician Discharge Summary   Patient: Brianna Mccarty MRN: [id number] DOB: [birth year]  Admit date:     03/03/2024  Discharge date: 03/06/24  Discharge Physician: Curlee Doss Hiromi Knodel   PCP: Aida House, MD   Recommendations at discharge:    Patient has been placed on diltiazem  for rate control atrial fibrillation and apixaban  for anticoagulation. Hold aspirin  to reduce risk of bleeding Discontinue amlodipine   Follow up renal function and electrolytes in 7 to 10 days  Follow up with with Dr Bambi Lever in 7 to 10 days  Follow up with Cardiology as scheduled.   Discharge Diagnoses: Principal Problem:   Atrial flutter (HCC) Active Problems:   Acute on chronic diastolic CHF (congestive heart failure) (HCC)   Asthma   Essential hypertension   Pure hypercholesterolemia   Hypomagnesemia   Atrial fibrillation (HCC)  Resolved Problems:   * No resolved hospital problems. [facility] Course: Mrs. Maccarone was admitted to the hospital with the working diagnosis of atrial fibrillation with rapid ventricular response.   83 yo female with the past medical history of hypertension, hyperlipidemia, SVT, and carotid artery disease who presented with palpitations.  Patient was diagnosed with asthma exacerbation has outpatient and was treated with bronchodilator therapy, steroids and antibiotics. On the day of admission while sitting she had severe palpitations and chest discomfort. Her heart rate was 178 bpm, despite taking oral metoprolol . Patient called EMS and she was found in atrial fibrillation with RVR, 80 to 160 bpm, she received full dose aspirin  and diltiazem  in route to the hospital.  On her initial physical examination her blood pressure was 107/88, HR 101 and RR 21, 02 saturation 98%  Lungs with no wheezing or rales, heart with S1 and S2 present irregularly irregular with no gallops, rubs or murmurs, abdomen with no distention and no lower extremity edema.   Na 132, K 4,0 Cl 98,  bicarbonate 23 glucose 147 bun 19 cr 0,68  Mg 1,6  BNP 247  High sensitive troponin 12 and 17  Wbc 6,9 hgb 11.1 plt 197  TSH 1,047  Sars covid 19 negative Respiratory viral panel negative  Urine analysis SG 1.012, negative protein, negative hgb and negative leukocytes.   Chest radiograph with hypoinflation, positive cardiomegaly with bilateral central interstitial infiltrates, no effusions.  EKG 101 bpm, left axis deviation, normal intervals, atrial flutter with variable block, rhythm with no significant ST segment or T wave changes.   Patient was placed on diltiazem  and apixaban  Cardiology was consulted and recommendation for direct current cardioversion.  05/02 successful direct current cardioversion, converting back to sinus rhythm.  05/03 patient continue on sinus rhythm, plan to continue diltiazem  for rate control and anticoagulation with apixaban  Follow up as outpatient.   Assessment and Plan: * Atrial flutter [facility]) Patient has converted to sinus rhythm after direct current cardioversion.  Plan to continue diltiazem  for rate control and for anticoagulation apixaban .  Follow up with cardiology as outpatient.   Acute on chronic diastolic CHF (congestive heart failure) (HCC) Echocardiogram with preserved LV systolic function with EF 60 to 65%, RV systolic function preserved, LA with severe dilatation, moderate mitral regurgitation, moderate aortic valve stenosis. RVSP 31.5 mmHg.   Patient had one low dose of oral furosemide  20 mg with improvement in her volume status.   Plan to continue losartan  for blood pressure control.  Diltiazem  for atrial fibrillation rate control.  Continue as needed furosemide .  Outpatient follow up with cardiology for valvular heart disease.   Asthma No  signs of acute exacerbation, will continue as needed bronchodilator therapy.    Essential hypertension Continue blood pressure control with losartan .  Discontinue amlodipine   Patient taking  hydralazine  as needed and daily prazosin .   Pure hypercholesterolemia Continue with statin therapy   Hypomagnesemia Hyponatremia.,   Stable renal function with serum cr at 0,92 with K at 4,3 and serum bicarbonate at 25  Na 138 Mg 2.1    Consultants: cardiology and EP  Procedures performed: direct current cardioversion   Disposition: Home Diet recommendation:  Cardiac diet DISCHARGE MEDICATION: Allergies as of 03/06/2024       Reactions   Celebrex [celecoxib] Diarrhea   Cephalexin    Reaction was a high fever Other Reaction(s): Unknown   Hydrochlorothiazide     hyponatremia Other Reaction(s): Unknown   Penicillin G Benzathine    Other Reaction(s): Unknown Other Reaction(s): Not available penicillin G benzathine   Penicillins Hives, Swelling   Swelling of arms & face Has patient had a PCN reaction causing immediate rash, facial/tongue/throat swelling, SOB or lightheadedness with hypotension: Yes Has patient had a PCN reaction causing severe rash involving mucus membranes or skin necrosis: No Has patient had a PCN reaction that required hospitalization: No Has patient had a PCN reaction occurring within the last 10 years: No If all of the above answers are "NO", then may proceed with Cephalosporin use. Other Reaction(s): Not available   Phenobarbital Hives   Other Reaction(s): Unknown   Statins    Muscle pain   Passion Fruit Flavoring Agent (non-screening) Diarrhea, Rash   Tape Rash   MEDICAL TAPE        Medication List     STOP taking these medications    amLODipine  10 MG tablet Commonly known as: NORVASC    aspirin  EC 81 MG tablet   doxycycline  100 MG capsule Commonly known as: VIBRAMYCIN    predniSONE  10 MG tablet Commonly known as: DELTASONE        TAKE these medications    Advair HFA 115-21 MCG/ACT inhaler Generic drug: fluticasone-salmeterol Inhale 2 puffs into the lungs 2 (two) times daily.   albuterol  108 (90 Base) MCG/ACT inhaler Commonly  known as: ProAir  HFA Inhale 2 puffs into the lungs every 6 (six) hours as needed for wheezing.   alendronate  70 MG tablet Commonly known as: FOSAMAX  TAKE 1 TABLET EVERY 7 (SEVEN) DAYS. TAKE WITH A FULL GLASS OF WATER  ON AN EMPTY STOMACH.   apixaban  5 MG Tabs tablet Commonly known as: ELIQUIS  Take 1 tablet (5 mg total) by mouth 2 (two) times daily.   azelastine  0.1 % nasal spray Commonly known as: ASTELIN  Instill 2 sprays into each nostril two times a day as needed for allergies   benzonatate  200 MG capsule Commonly known as: TESSALON  Take 1 capsule (200 mg total) by mouth 3 (three) times daily as needed for cough.   CALCIUM  PO Take 1,200 mg by mouth daily.   Cholecalciferol 125 MCG (5000 UT) Tabs Take 1 tablet by mouth daily.   desmopressin  0.2 MG tablet Commonly known as: DDAVP  Take 0.2 mg by mouth at bedtime.   diltiazem  180 MG 24 hr capsule Commonly known as: CARDIZEM  CD Take 1 capsule (180 mg total) by mouth daily. Start taking on: Mar 07, 2024   EPINEPHrine  0.3 mg/0.3 mL Soaj injection Commonly known as: EPI-PEN Inject 0.3 mg into the muscle as needed for anaphylaxis.   fexofenadine 180 MG tablet Commonly known as: ALLEGRA Take 180 mg by mouth daily.   fluticasone 0.05 % cream  Commonly known as: CUTIVATE Apply 1 Application topically 2 (two) times daily.   furosemide  20 MG tablet Commonly known as: LASIX  Take 1 tablet (20 mg total) by mouth daily as needed for edema.   hydrALAZINE  50 MG tablet Commonly known as: APRESOLINE  Take 50 mg by mouth daily as needed (for sBP of 150 or greater).   hydrOXYzine  10 MG tablet Commonly known as: ATARAX  Take 10 mg by mouth at bedtime.   ipratropium-albuterol  0.5-2.5 (3) MG/3ML Soln Commonly known as: DUONEB Take 3 mLs by nebulization every 6 (six) hours as needed.   ketoconazole 2 % cream Commonly known as: NIZORAL Apply 1 Application topically 2 (two) times daily.   losartan  100 MG tablet Commonly known as:  COZAAR  Take 1 tablet (100 mg total) by mouth daily.   MAGNESIUM  PO Take 250 mg by mouth daily.   METAMUCIL FIBER PO Take 1 capsule by mouth daily.   MULTIVITAMIN PO Take 1 tablet by mouth daily.   Nexlizet  180-10 MG Tabs Generic drug: Bempedoic Acid -Ezetimibe  Take 1 tablet by mouth daily.   omeprazole  40 MG capsule Commonly known as: PRILOSEC TAKE 1 CAPSULE (40 MG TOTAL) BY MOUTH DAILY.   OVER THE COUNTER MEDICATION Juice Plus-daily   OVER THE COUNTER MEDICATION Nopolea 3 oz once a day   prazosin  5 MG capsule Commonly known as: MINIPRESS  TAKE 1 CAPSULE BY MOUTH 2 TIMES DAILY.   PROBIOTIC PO Take 1 capsule by mouth daily.   vitamin B-12 100 MCG tablet Commonly known as: CYANOCOBALAMIN  Take 100 mcg by mouth daily.   vitamin C 1000 MG tablet Take 1,000 mg by mouth daily.        Discharge Exam: Filed Weights   03/04/24 0526 03/05/24 0516 03/06/24 0435  Weight: 72.2 kg 71.9 kg 72.2 kg   BP 130/64 (BP Location: Right Arm)   Pulse 61   Temp 98.6 F (37 C) (Oral)   Resp 16   Ht 5\' 1"  (1.549 m)   Wt 72.2 kg   SpO2 97%   BMI 30.06 kg/m   Patient is feeling better, with no chest pain, no dyspnea, no PND or orthopnea, no lower extremity edema   Neurology awake and alert ENT with no pallor Cardiovascular with S1 and S2 present and regular with no gallops, rubs or murmurs Respiratory with no rales or wheezing, no rhonchi  Abdomen with no distention  No lower extremity edema    Condition at discharge: stable  The results of significant diagnostics from this hospitalization (including imaging, microbiology, ancillary and laboratory) are listed below for reference.   Imaging Studies: ECHO TEE Result Date: 03/06/2024    TRANSESOPHOGEAL ECHO REPORT   Patient Name:   FANTASHIA JOCHIM Date of Exam: 03/05/2024 Medical Rec #:  [id number]       Height:       61.0 in Accession #:    [id number]      Weight:       158.5 lb Date of Birth:  [birth year]       BSA:           1.711 m Patient Age:    83 years        BP:           134/83 mmHg Patient Gender: F               HR:           133 bpm. Exam Location:  Inpatient Procedure: 3D Echo, Transesophageal Echo, Cardiac  Doppler, Color Doppler and            Saline Contrast Bubble Study (Both Spectral and Color Flow Doppler            were utilized during procedure). Indications:     Atrial Flutter  History:         Patient has prior history of Echocardiogram examinations, most                  recent 12/15/2023. CHF, CAD, Arrythmias:Atrial Flutter and                  Tachycardia; Risk Factors:Hypertension and Sleep Apnea.  Sonographer:     Terrilee Few RCS Referring Phys:  [id number] Darline Eis HALEY Diagnosing Phys: Grady Lawman MD PROCEDURE: After discussion of the risks and benefits of a TEE, an informed consent was obtained from the patient. The transesophogeal probe was passed without difficulty through the esophogus of the patient. Imaged were obtained with the patient in a left lateral decubitus position. Sedation performed by different physician. The patient was monitored while under deep sedation. Anesthestetic sedation was provided intravenously by Anesthesiology: 398.28mg  of Propofol , 100mg  of Lidocaine . The patient developed no complications during the procedure. A successful direct current cardioversion was performed at 200 joules with 1 attempt.  IMPRESSIONS  1. Left ventricular ejection fraction, by estimation, is 55 to 60%. The left ventricle has normal function.  2. Right ventricular systolic function is mildly reduced. The right ventricular size is normal.  3. Left atrial size was severely dilated. No left atrial/left atrial appendage thrombus was detected. The LAA emptying velocity was 39 cm/s.  4. Right atrial size was mildly dilated.  5. A small pericardial effusion is present.  6. The mitral valve is degenerative. Moderate mitral valve regurgitation. No evidence of mitral stenosis.  7. Tricuspid valve regurgitation  is mild to moderate.  8. The aortic valve is abnormal. There is moderate calcification of the aortic valve. Aortic valve regurgitation is not visualized. Moderate aortic valve stenosis. Aortic valve area, by VTI measures 0.70 cm. Aortic valve mean gradient measures 21.0 mmHg. Aortic valve Vmax measures 2.88 m/s.  9. There is mild (Grade II) plaque involving the aortic arch and descending aorta. 10. Agitated saline contrast bubble study was negative, with no evidence of any interatrial shunt. 11. 3D performed of the LAA and demonstrates no LAA thrombus. Conclusion(s)/Recommendation(s): No LA/LAA thrombus identified. Successful cardioversion performed with restoration of normal sinus rhythm. FINDINGS  Left Ventricle: Left ventricular ejection fraction, by estimation, is 55 to 60%. The left ventricle has normal function. The left ventricular internal cavity size was normal in size. Right Ventricle: The right ventricular size is normal. No increase in right ventricular wall thickness. Right ventricular systolic function is mildly reduced. Left Atrium: Left atrial size was severely dilated. No left atrial/left atrial appendage thrombus was detected. The LAA emptying velocity was 39 cm/s. Right Atrium: Right atrial size was mildly dilated. Pericardium: A small pericardial effusion is present. Mitral Valve: The mitral valve is degenerative in appearance. Mild mitral annular calcification. Moderate mitral valve regurgitation. No evidence of mitral valve stenosis. Tricuspid Valve: The tricuspid valve is grossly normal. Tricuspid valve regurgitation is mild to moderate. Aortic Valve: The aortic valve is abnormal. There is moderate calcification of the aortic valve. Aortic valve regurgitation is not visualized. Moderate aortic stenosis is present. Aortic valve mean gradient measures 21.0 mmHg. Aortic valve peak gradient measures 33.2 mmHg. Aortic valve area, by VTI measures 0.70  cm. Pulmonic Valve: The pulmonic valve was  normal in structure. Pulmonic valve regurgitation is trivial. Aorta: The aortic root is normal in size and structure. There is mild (Grade II) plaque involving the aortic arch and descending aorta. IAS/Shunts: No atrial level shunt detected by color flow Doppler. Agitated saline contrast was given intravenously to evaluate for intracardiac shunting. Agitated saline contrast bubble study was negative, with no evidence of any interatrial shunt. Additional Comments: 3D was performed not requiring image post processing on an independent workstation and was normal. Spectral Doppler performed. LEFT VENTRICLE PLAX 2D LVOT diam:     2.10 cm LV SV:         36 LV SV Index:   21 LVOT Area:     3.46 cm  AORTIC VALVE AV Area (Vmax):    0.74 cm AV Area (Vmean):   0.60 cm AV Area (VTI):     0.70 cm AV Vmax:           288.00 cm/s AV Vmean:          219.000 cm/s AV VTI:            0.514 m AV Peak Grad:      33.2 mmHg AV Mean Grad:      21.0 mmHg LVOT Vmax:         61.90 cm/s LVOT Vmean:        38.000 cm/s LVOT VTI:          0.104 m LVOT/AV VTI ratio: 0.20  AORTA Ao Root diam: 3.00 cm Ao Asc diam:  3.70 cm  SHUNTS Systemic VTI:  0.10 m Systemic Diam: 2.10 cm Grady Lawman MD Electronically signed by Grady Lawman MD Signature Date/Time: 03/06/2024/5:41:12 AM    Final    EP STUDY Result Date: 03/05/2024 See surgical note for result.  CARDIAC EVENT MONITOR Result Date: 03/03/2024   Intermittently noted to have idioventricular rhythm, patient called by RN with each notification from [facility], and patient reports being asymptomatic. * The predominant rhythm was Sinus Rhythm. * The Maximum Heart Rate recorded was 125 BPM, 04: 04 PM 3/ 6, the Minimum Heart Rate recorded was 38 BPM, 04: 33 AM 3/ 5 and the Average Heart Rate was 55 BPM. * There were 6, 548 VE beats with a burden of < 1 % . There was 1 occurrence of Ventricular Tachycardia with the longest episode 2s, 05: 04 AM 3/ 8 and fastest episode 139 BPM, 05: 04 AM  3/ 8. * There were 107, 060 SVE beats with a burden of 5 % . * There were 2 manually detected events.   DG Chest Port 1 View Result Date: 03/03/2024 CLINICAL DATA:  Chest pain EXAM: PORTABLE CHEST 1 VIEW COMPARISON:  02/27/2024 FINDINGS: Lungs are symmetrically well expanded. Bibasilar peribronchial infiltrates have developed, suggestive of infectious bronchiolitis or aspiration in the acute setting. No pneumothorax. Trace bilateral pleural effusion. Stable cardiomegaly. Pulmonary vascularity is. No acute bone abnormality. IMPRESSION: 1. Interval development of bibasilar peribronchial infiltrates, suggestive of infectious bronchiolitis or aspiration in the acute setting. 2. Stable cardiomegaly Electronically Signed   By: Worthy Heads M.D.   On: 03/03/2024 05:15   DG Chest 2 View Result Date: 02/27/2024 CLINICAL DATA:  Cough and shortness of breath since Tuesday EXAM: CHEST - 2 VIEW COMPARISON:  06/17/2023 FINDINGS: Frontal and lateral views of the chest demonstrates stable enlargement of the cardiac silhouette. No acute airspace disease, effusion, or pneumothorax. No acute bony abnormalities. IMPRESSION: 1. Stable  chest, no acute process. Electronically Signed   By: Bobbye Burrow M.D.   On: 02/27/2024 20:20    Microbiology: Results for orders placed or performed during the hospital encounter of 03/03/24  SARS Coronavirus 2 by RT PCR (hospital order, performed in [facility] hospital lab) *cepheid single result test* Urine, Clean Catch     Status: None   Collection Time: 03/03/24  7:44 AM   Specimen: Urine, Clean Catch; Nasal Swab  Result Value Ref Range Status   SARS Coronavirus 2 by RT PCR NEGATIVE NEGATIVE Final    Comment: Performed at [facility], [address], [place], [place] [address]  Respiratory (~20 pathogens) panel by PCR     Status: None   Collection Time: 03/03/24  7:44 AM   Specimen: Urine, Clean Catch; Respiratory  Result Value Ref Range Status   Adenovirus NOT  DETECTED NOT DETECTED Final   Coronavirus 229E NOT DETECTED NOT DETECTED Final    Comment: (NOTE) The Coronavirus on the Respiratory Panel, DOES NOT test for the novel  Coronavirus (2019 nCoV)    Coronavirus HKU1 NOT DETECTED NOT DETECTED Final   Coronavirus NL63 NOT DETECTED NOT DETECTED Final   Coronavirus OC43 NOT DETECTED NOT DETECTED Final   Metapneumovirus NOT DETECTED NOT DETECTED Final   Rhinovirus / Enterovirus NOT DETECTED NOT DETECTED Final   Influenza A NOT DETECTED NOT DETECTED Final   Influenza B NOT DETECTED NOT DETECTED Final   Parainfluenza Virus 1 NOT DETECTED NOT DETECTED Final   Parainfluenza Virus 2 NOT DETECTED NOT DETECTED Final   Parainfluenza Virus 3 NOT DETECTED NOT DETECTED Final   Parainfluenza Virus 4 NOT DETECTED NOT DETECTED Final   Respiratory Syncytial Virus NOT DETECTED NOT DETECTED Final   Bordetella pertussis NOT DETECTED NOT DETECTED Final   Bordetella Parapertussis NOT DETECTED NOT DETECTED Final   Chlamydophila pneumoniae NOT DETECTED NOT DETECTED Final   Mycoplasma pneumoniae NOT DETECTED NOT DETECTED Final    Comment: Performed at [facility], [address], [place], [place] [address]    Labs: CBC: Recent Labs  Lab 03/03/24 0335 03/04/24 0236  WBC 6.9 10.0  HGB 11.1* 12.3  HCT 34.3* 36.6  MCV 91.2 89.1  PLT 197 232   Basic Metabolic Panel: Recent Labs  Lab 03/03/24 0335 03/04/24 0236 03/05/24 0259  NA 132* 136 138  K 4.0 4.2 4.3  CL 98 102 106  CO2 23 24 25   GLUCOSE 147* 109* 120*  BUN 19 11 19   CREATININE 0.68 0.71 0.92  CALCIUM  9.4 9.0 8.8*  MG 1.6*  --  2.1   Liver Function Tests: No results for input(s): "AST", "ALT", "ALKPHOS", "BILITOT", "PROT", "ALBUMIN" in the last 168 hours. CBG: No results for input(s): "GLUCAP" in the last 168 hours.  Discharge time spent: greater than 30 minutes.  Signed: Albertus Alt, MD [facility] 03/06/2024

## 2024-03-06 NOTE — Progress Notes (Signed)
 Progress Note  Patient Name: Brianna Mccarty Date of Encounter: 03/06/2024  Primary Cardiologist:   Euell Herrlich, MD   Subjective   No chest pain.  No SOB.  No palps.   Inpatient Medications    Scheduled Meds:  acidophilus  1 capsule Oral Daily   apixaban   5 mg Oral BID   diltiazem   180 mg Oral Daily   loratadine   10 mg Oral Daily   losartan   100 mg Oral Daily   melatonin  3 mg Oral QHS   mometasone -formoterol   2 puff Inhalation BID   sodium chloride  flush  3 mL Intravenous Q12H   Continuous Infusions:  PRN Meds: acetaminophen  **OR** acetaminophen , albuterol , furosemide , hydrALAZINE , ondansetron  **OR** ondansetron  (ZOFRAN ) IV   Vital Signs    Vitals:   03/05/24 1553 03/05/24 2000 03/06/24 0009 03/06/24 0435  BP: 117/71 (!) 102/53 (!) 121/54 135/64  Pulse:  (!) 59    Resp: 18 18 17 18   Temp: 98 F (36.7 C) 98.4 F (36.9 C) 98.1 F (36.7 C) 98.6 F (37 C)  TempSrc: Oral Oral Oral Oral  SpO2: 99% 97% 94% 99%  Weight:    72.2 kg  Height:        Intake/Output Summary (Last 24 hours) at 03/06/2024 0903 Last data filed at 03/05/2024 2300 Gross per 24 hour  Intake 1280 ml  Output 100 ml  Net 1180 ml   Filed Weights   03/04/24 0526 03/05/24 0516 03/06/24 0435  Weight: 72.2 kg 71.9 kg 72.2 kg    Telemetry    NSR - Personally Reviewed  ECG    NA - Personally Reviewed  Physical Exam   GEN: No acute distress.   Neck: No  JVD Cardiac: RRR, 2/6 apical systolic murmur, positive diastolic murmurs, rubs, or gallops.  Respiratory: Clear  to auscultation bilaterally. GI: Soft, nontender, non-distended  MS: No  edema; No deformity. Neuro:  Nonfocal  Psych: Normal affect   Labs    Chemistry Recent Labs  Lab 03/03/24 0335 03/04/24 0236 03/05/24 0259  NA 132* 136 138  K 4.0 4.2 4.3  CL 98 102 106  CO2 23 24 25   GLUCOSE 147* 109* 120*  BUN 19 11 19   CREATININE 0.68 0.71 0.92  CALCIUM  9.4 9.0 8.8*  GFRNONAA >60 >60 >60  ANIONGAP 11 10 7       Hematology Recent Labs  Lab 03/03/24 0335 03/04/24 0236  WBC 6.9 10.0  RBC 3.76* 4.11  HGB 11.1* 12.3  HCT 34.3* 36.6  MCV 91.2 89.1  MCH 29.5 29.9  MCHC 32.4 33.6  RDW 13.5 13.2  PLT 197 232    Cardiac EnzymesNo results for input(s): "TROPONINI" in the last 168 hours. No results for input(s): "TROPIPOC" in the last 168 hours.   BNP Recent Labs  Lab 03/03/24 0335  BNP 247.5*     DDimer  Recent Labs  Lab 03/03/24 1610  DDIMER 0.54*     Radiology    ECHO TEE Result Date: 03/06/2024    TRANSESOPHOGEAL ECHO REPORT   Patient Name:   Brianna Mccarty Date of Exam: 03/05/2024 Medical Rec #:  [id number]       Height:       61.0 in Accession #:    [id number]      Weight:       158.5 lb Date of Birth:  [birth year]       BSA:          1.711 m Patient  Age:    83 years        BP:           134/83 mmHg Patient Gender: F               HR:           133 bpm. Exam Location:  Inpatient Procedure: 3D Echo, Transesophageal Echo, Cardiac Doppler, Color Doppler and            Saline Contrast Bubble Study (Both Spectral and Color Flow Doppler            were utilized during procedure). Indications:     Atrial Flutter  History:         Patient has prior history of Echocardiogram examinations, most                  recent 12/15/2023. CHF, CAD, Arrythmias:Atrial Flutter and                  Tachycardia; Risk Factors:Hypertension and Sleep Apnea.  Sonographer:     Terrilee Few RCS Referring Phys:  [id number] Darline Eis HALEY Diagnosing Phys: Grady Lawman MD PROCEDURE: After discussion of the risks and benefits of a TEE, an informed consent was obtained from the patient. The transesophogeal probe was passed without difficulty through the esophogus of the patient. Imaged were obtained with the patient in a left lateral decubitus position. Sedation performed by different physician. The patient was monitored while under deep sedation. Anesthestetic sedation was provided intravenously by Anesthesiology: 398.28mg  of  Propofol , 100mg  of Lidocaine . The patient developed no complications during the procedure. A successful direct current cardioversion was performed at 200 joules with 1 attempt.  IMPRESSIONS  1. Left ventricular ejection fraction, by estimation, is 55 to 60%. The left ventricle has normal function.  2. Right ventricular systolic function is mildly reduced. The right ventricular size is normal.  3. Left atrial size was severely dilated. No left atrial/left atrial appendage thrombus was detected. The LAA emptying velocity was 39 cm/s.  4. Right atrial size was mildly dilated.  5. A small pericardial effusion is present.  6. The mitral valve is degenerative. Moderate mitral valve regurgitation. No evidence of mitral stenosis.  7. Tricuspid valve regurgitation is mild to moderate.  8. The aortic valve is abnormal. There is moderate calcification of the aortic valve. Aortic valve regurgitation is not visualized. Moderate aortic valve stenosis. Aortic valve area, by VTI measures 0.70 cm. Aortic valve mean gradient measures 21.0 mmHg. Aortic valve Vmax measures 2.88 m/s.  9. There is mild (Grade II) plaque involving the aortic arch and descending aorta. 10. Agitated saline contrast bubble study was negative, with no evidence of any interatrial shunt. 11. 3D performed of the LAA and demonstrates no LAA thrombus. Conclusion(s)/Recommendation(s): No LA/LAA thrombus identified. Successful cardioversion performed with restoration of normal sinus rhythm. FINDINGS  Left Ventricle: Left ventricular ejection fraction, by estimation, is 55 to 60%. The left ventricle has normal function. The left ventricular internal cavity size was normal in size. Right Ventricle: The right ventricular size is normal. No increase in right ventricular wall thickness. Right ventricular systolic function is mildly reduced. Left Atrium: Left atrial size was severely dilated. No left atrial/left atrial appendage thrombus was detected. The LAA emptying  velocity was 39 cm/s. Right Atrium: Right atrial size was mildly dilated. Pericardium: A small pericardial effusion is present. Mitral Valve: The mitral valve is degenerative in appearance. Mild mitral annular calcification. Moderate mitral valve regurgitation. No  evidence of mitral valve stenosis. Tricuspid Valve: The tricuspid valve is grossly normal. Tricuspid valve regurgitation is mild to moderate. Aortic Valve: The aortic valve is abnormal. There is moderate calcification of the aortic valve. Aortic valve regurgitation is not visualized. Moderate aortic stenosis is present. Aortic valve mean gradient measures 21.0 mmHg. Aortic valve peak gradient measures 33.2 mmHg. Aortic valve area, by VTI measures 0.70 cm. Pulmonic Valve: The pulmonic valve was normal in structure. Pulmonic valve regurgitation is trivial. Aorta: The aortic root is normal in size and structure. There is mild (Grade II) plaque involving the aortic arch and descending aorta. IAS/Shunts: No atrial level shunt detected by color flow Doppler. Agitated saline contrast was given intravenously to evaluate for intracardiac shunting. Agitated saline contrast bubble study was negative, with no evidence of any interatrial shunt. Additional Comments: 3D was performed not requiring image post processing on an independent workstation and was normal. Spectral Doppler performed. LEFT VENTRICLE PLAX 2D LVOT diam:     2.10 cm LV SV:         36 LV SV Index:   21 LVOT Area:     3.46 cm  AORTIC VALVE AV Area (Vmax):    0.74 cm AV Area (Vmean):   0.60 cm AV Area (VTI):     0.70 cm AV Vmax:           288.00 cm/s AV Vmean:          219.000 cm/s AV VTI:            0.514 m AV Peak Grad:      33.2 mmHg AV Mean Grad:      21.0 mmHg LVOT Vmax:         61.90 cm/s LVOT Vmean:        38.000 cm/s LVOT VTI:          0.104 m LVOT/AV VTI ratio: 0.20  AORTA Ao Root diam: 3.00 cm Ao Asc diam:  3.70 cm  SHUNTS Systemic VTI:  0.10 m Systemic Diam: 2.10 cm Grady Lawman MD  Electronically signed by Grady Lawman MD Signature Date/Time: 03/06/2024/5:41:12 AM    Final    EP STUDY Result Date: 03/05/2024 See surgical note for result.   Cardiac Studies   NA/see above.   Patient Profile     83 y.o. female  with past medical history of hypertension, palpitations, bradycardia, moderate aortic stenosis, mitral regurgitation for evaluation of new onset atrial fibrillation/flutter. Echocardiogram February 2025 showed normal LV function, severe left atrial enlargement, moderate mitral regurgitation, moderate aortic stenosis with mean gradient 21 mmHg and aortic valve area 1 cm. Monitor April 2025 showed sinus rhythm with minimum heart rate of 38 at 4:33 AM, occasional PVC, PACs.   Assessment & Plan    Atrial flutter:  New diagnosis.  TEE/DCCV yesterday.  NL LV/RV function.  Cardioverted.  Maintaining NSR.    Meds as on MAR.    Aortic stenosis/mitral regurgitation:  Follow clinically.   See results above.     Hypertension:  Controlled.  No change in therapy.   Has follow up in May in our office.    For questions or updates, please contact [facility] Please consult [email or web address] for contact info under Cardiology/STEMI.   Signed, Eilleen Grates, MD  03/06/2024, 9:03 AM

## 2024-03-06 NOTE — Progress Notes (Signed)
 Nsg Discharge Note  Admit Date:  03/03/2024 Discharge date: 03/06/2024   Brianna Mccarty to be D/C'd Home per MD order.  AVS completed.  Patient/caregiver able to verbalize understanding.  Discharge Medication: Allergies as of 03/06/2024       Reactions   Celebrex [celecoxib] Diarrhea   Cephalexin    Reaction was a high fever Other Reaction(s): Unknown   Hydrochlorothiazide     hyponatremia Other Reaction(s): Unknown   Penicillin G Benzathine    Other Reaction(s): Unknown Other Reaction(s): Not available penicillin G benzathine   Penicillins Hives, Swelling   Swelling of arms & face Has patient had a PCN reaction causing immediate rash, facial/tongue/throat swelling, SOB or lightheadedness with hypotension: Yes Has patient had a PCN reaction causing severe rash involving mucus membranes or skin necrosis: No Has patient had a PCN reaction that required hospitalization: No Has patient had a PCN reaction occurring within the last 10 years: No If all of the above answers are "NO", then may proceed with Cephalosporin use. Other Reaction(s): Not available   Phenobarbital Hives   Other Reaction(s): Unknown   Statins    Muscle pain   Passion Fruit Flavoring Agent (non-screening) Diarrhea, Rash   Tape Rash   MEDICAL TAPE        Medication List     STOP taking these medications    amLODipine  10 MG tablet Commonly known as: NORVASC    aspirin  EC 81 MG tablet   doxycycline  100 MG capsule Commonly known as: VIBRAMYCIN    predniSONE  10 MG tablet Commonly known as: DELTASONE        TAKE these medications    Advair HFA 115-21 MCG/ACT inhaler Generic drug: fluticasone-salmeterol Inhale 2 puffs into the lungs 2 (two) times daily.   albuterol  108 (90 Base) MCG/ACT inhaler Commonly known as: ProAir  HFA Inhale 2 puffs into the lungs every 6 (six) hours as needed for wheezing.   alendronate  70 MG tablet Commonly known as: FOSAMAX  TAKE 1 TABLET EVERY 7 (SEVEN) DAYS. TAKE WITH  A FULL GLASS OF WATER  ON AN EMPTY STOMACH.   azelastine  0.1 % nasal spray Commonly known as: ASTELIN  Instill 2 sprays into each nostril two times a day as needed for allergies   benzonatate  200 MG capsule Commonly known as: TESSALON  Take 1 capsule (200 mg total) by mouth 3 (three) times daily as needed for cough.   CALCIUM  PO Take 1,200 mg by mouth daily.   Cholecalciferol 125 MCG (5000 UT) Tabs Take 1 tablet by mouth daily.   desmopressin  0.2 MG tablet Commonly known as: DDAVP  Take 0.2 mg by mouth at bedtime.   diltiazem  180 MG 24 hr capsule Commonly known as: CARDIZEM  CD Take 1 capsule (180 mg total) by mouth daily. Start taking on: Mar 07, 2024   Eliquis  5 MG Tabs tablet Generic drug: apixaban  Take 1 tablet (5 mg total) by mouth 2 (two) times daily.   EPINEPHrine  0.3 mg/0.3 mL Soaj injection Commonly known as: EPI-PEN Inject 0.3 mg into the muscle as needed for anaphylaxis.   fexofenadine 180 MG tablet Commonly known as: ALLEGRA Take 180 mg by mouth daily.   fluticasone 0.05 % cream Commonly known as: CUTIVATE Apply 1 Application topically 2 (two) times daily.   furosemide  20 MG tablet Commonly known as: LASIX  Take 1 tablet (20 mg total) by mouth daily as needed for edema.   hydrALAZINE  50 MG tablet Commonly known as: APRESOLINE  Take 50 mg by mouth daily as needed (for sBP of 150 or greater).  hydrOXYzine  10 MG tablet Commonly known as: ATARAX  Take 10 mg by mouth at bedtime.   ipratropium-albuterol  0.5-2.5 (3) MG/3ML Soln Commonly known as: DUONEB Take 3 mLs by nebulization every 6 (six) hours as needed.   ketoconazole 2 % cream Commonly known as: NIZORAL Apply 1 Application topically 2 (two) times daily.   losartan  100 MG tablet Commonly known as: COZAAR  Take 1 tablet (100 mg total) by mouth daily.   MAGNESIUM  PO Take 250 mg by mouth daily.   METAMUCIL FIBER PO Take 1 capsule by mouth daily.   MULTIVITAMIN PO Take 1 tablet by mouth daily.    Nexlizet  180-10 MG Tabs Generic drug: Bempedoic Acid -Ezetimibe  Take 1 tablet by mouth daily.   omeprazole  40 MG capsule Commonly known as: PRILOSEC TAKE 1 CAPSULE (40 MG TOTAL) BY MOUTH DAILY.   OVER THE COUNTER MEDICATION Juice Plus-daily   OVER THE COUNTER MEDICATION Nopolea 3 oz once a day   prazosin  5 MG capsule Commonly known as: MINIPRESS  TAKE 1 CAPSULE BY MOUTH 2 TIMES DAILY.   PROBIOTIC PO Take 1 capsule by mouth daily.   vitamin B-12 100 MCG tablet Commonly known as: CYANOCOBALAMIN  Take 100 mcg by mouth daily.   vitamin C 1000 MG tablet Take 1,000 mg by mouth daily.        Discharge Assessment: Vitals:   03/06/24 0435 03/06/24 0815  BP: 135/64 130/64  Pulse:  61  Resp: 18 16  Temp: 98.6 F (37 C) 98.6 F (37 C)  SpO2: 99% 97%   Skin clean, dry and intact without evidence of skin break down, no evidence of skin tears noted. IV catheter discontinued intact. Site without signs and symptoms of complications - no redness or edema noted at insertion site, patient denies c/o pain - only slight tenderness at site.  Dressing with slight pressure applied.  D/c Instructions-Education: Discharge instructions given to patient/family with verbalized understanding. D/c education completed with patient/family including follow up instructions, medication list, d/c activities limitations if indicated, with other d/c instructions as indicated by MD - patient able to verbalize understanding, all questions fully answered. Patient instructed to return to ED, call 911, or call MD for any changes in condition.  Patient escorted via WC, and D/C home via private auto.  Latisha Lasch, Sable Cram, RN 03/06/2024 12:31 PM

## 2024-03-06 NOTE — Plan of Care (Signed)
  Problem: Clinical Measurements: Goal: Cardiovascular complication will be avoided Outcome: Progressing   Problem: Nutrition: Goal: Adequate nutrition will be maintained Outcome: Progressing   

## 2024-03-06 NOTE — Plan of Care (Signed)
  Problem: Health Behavior/Discharge Planning: Goal: Ability to manage health-related needs will improve Outcome: Adequate for Discharge   

## 2024-03-08 DIAGNOSIS — M7918 Myalgia, other site: Secondary | ICD-10-CM | POA: Diagnosis not present

## 2024-03-08 DIAGNOSIS — M47812 Spondylosis without myelopathy or radiculopathy, cervical region: Secondary | ICD-10-CM | POA: Diagnosis not present

## 2024-03-08 NOTE — Anesthesia Postprocedure Evaluation (Signed)
 Anesthesia Post Note  Patient: Brianna Mccarty  Procedure(s) Performed: TRANSESOPHAGEAL ECHOCARDIOGRAM CARDIOVERSION     Patient location during evaluation: Cath Lab Anesthesia Type: MAC Level of consciousness: awake Pain management: pain level controlled Vital Signs Assessment: post-procedure vital signs reviewed and stable Respiratory status: spontaneous breathing, nonlabored ventilation and respiratory function stable Cardiovascular status: blood pressure returned to baseline and stable Postop Assessment: no apparent nausea or vomiting Anesthetic complications: no   No notable events documented.  Last Vitals:  Vitals:   03/06/24 0435 03/06/24 0815  BP: 135/64 130/64  Pulse:  61  Resp: 18 16  Temp: 37 C 37 C  SpO2: 99% 97%    Last Pain:  Vitals:   03/06/24 0815  TempSrc: Oral  PainSc: 0-No pain                 Marcelles Clinard P Elijah Phommachanh

## 2024-03-09 DIAGNOSIS — L820 Inflamed seborrheic keratosis: Secondary | ICD-10-CM | POA: Diagnosis not present

## 2024-03-09 DIAGNOSIS — L218 Other seborrheic dermatitis: Secondary | ICD-10-CM | POA: Diagnosis not present

## 2024-03-09 DIAGNOSIS — L304 Erythema intertrigo: Secondary | ICD-10-CM | POA: Diagnosis not present

## 2024-03-10 ENCOUNTER — Ambulatory Visit (INDEPENDENT_AMBULATORY_CARE_PROVIDER_SITE_OTHER): Payer: HMO | Admitting: Nurse Practitioner

## 2024-03-10 VITALS — BP 126/72 | HR 67 | Ht 60.0 in | Wt 159.0 lb

## 2024-03-10 DIAGNOSIS — M810 Age-related osteoporosis without current pathological fracture: Secondary | ICD-10-CM | POA: Diagnosis not present

## 2024-03-10 DIAGNOSIS — B009 Herpesviral infection, unspecified: Secondary | ICD-10-CM

## 2024-03-10 DIAGNOSIS — Z01419 Encounter for gynecological examination (general) (routine) without abnormal findings: Secondary | ICD-10-CM

## 2024-03-10 DIAGNOSIS — Z9189 Other specified personal risk factors, not elsewhere classified: Secondary | ICD-10-CM

## 2024-03-10 DIAGNOSIS — L900 Lichen sclerosus et atrophicus: Secondary | ICD-10-CM | POA: Diagnosis not present

## 2024-03-10 DIAGNOSIS — L309 Dermatitis, unspecified: Secondary | ICD-10-CM | POA: Diagnosis not present

## 2024-03-10 DIAGNOSIS — Z780 Asymptomatic menopausal state: Secondary | ICD-10-CM | POA: Diagnosis not present

## 2024-03-10 MED ORDER — BETAMETHASONE VALERATE 0.1 % EX OINT: 1.0000 | TOPICAL_OINTMENT | Freq: Two times a day (BID) | CUTANEOUS | 0 refills | Status: DC

## 2024-03-10 NOTE — Progress Notes (Signed)
 Brianna Mccarty 16-Dec-1940 [id number]   History:  83 y.o. G2P2002 presents for breast and pelvic exam without GYN complaints. Postmenopausal - no HRT, no bleeding. Cryosurgery for abnormal pap >40 years ago, subsequent paps normal. H/O lichen Sclerosus, has not needed creams in a few years. Osteoporosis managed by PCP, was restarted of Fosamax  July 2021. HSV 1, occasional outbreaks. Has area in upper gluteal fold that is red. Has tried antifungals with no relief. Recent cardioversion for a fib, on Eliquis .   Gynecologic History No LMP recorded. Patient is postmenopausal.   Contraception: post menopausal status Sexually active: No  Health maintenance Last Pap: 07/03/2015. Results were: Normal Last mammogram: 04/17/2023. Results were: Normal Last colonoscopy: 12/18/2022. Results were; Tubular adenomas, no follow up recommended d/t age Last Dexa: 12/10/2022. T-score -1.3  Past medical history, past surgical history, family history and social history were all reviewed and documented in the EPIC chart. Married. 2 children. Involved in her church. 2 great grandchildren ages 62, 40 and 1.   ROS:  A ROS was performed and pertinent positives and negatives are included.  Exam:  Vitals:   03/10/24 1504  BP: 126/72  Pulse: 67  SpO2: 100%  Weight: 159 lb (72.1 kg)  Height: 5' (1.524 m)    Body mass index is 31.05 kg/m.  General appearance:  Normal Thyroid :  Symmetrical, normal in size, without palpable masses or nodularity. Respiratory  Auscultation:  Clear without wheezing or rhonchi Cardiovascular  Auscultation:  Regular rate, without rubs, murmurs or gallops  Edema/varicosities:  Not grossly evident Abdominal  Soft,nontender, without masses, guarding or rebound.  Liver/spleen:  No organomegaly noted  Hernia:  None appreciated  Skin  Inspection:  Grossly normal Breasts: Examined lying and sitting.   Right: Without masses, retractions, discharge or axillary adenopathy.   Left: Without  masses, retractions, discharge or axillary adenopathy. Pelvic: External genitalia:  no lesions              Urethra:  normal appearing urethra with no masses, tenderness or lesions              Bartholins and Skenes: normal                 Vagina: normal appearing vagina with normal color and discharge, no lesions. Atrophic changes              Cervix: no lesions Bimanual Exam:  Uterus:  no masses or tenderness              Adnexa: no mass, fullness, tenderness              Rectovaginal: Deferred              Anus:  normal, no lesions  Doria Garden, NP student present as chaperone.  Assessment/Plan:  83 y.o. [id number] for breast and pelvic exam.   Encounter for breast and pelvic examination - Education provided on SBEs, importance of preventative screenings, current guidelines, high calcium  diet, regular exercise, and multivitamin daily. Labs with PCP.   Postmenopausal - no HRT, no bleeding  Age-related osteoporosis without current pathological fracture - 12/2022 T-score -1.3, PCP manages. Restarted on Fosamax  July 2021.   HSV-1 infection - rare outbreaks. Does not need refills at this time.   Lichen sclerosus - Stable. Has not needed cream for a few years.   Dermatitis - Plan: betamethasone valerate ointment (VALISONE) 0.1 % BID x 7-1 days. Apply thin later twice daily.   Screening for cervical cancer -  Cryosurgery >40 years ago, subsequent paps normal. No longer screening per guidelines.   Screening for breast cancer - Normal mammogram history.  Continue annual screenings.  Normal breast exam today.  Screening for colon cancer - 12/2022 colonoscopy for problem. Screenings no longer recommended d/t age per GI.  Return in about 1 year (around 03/10/2025) for B&P (high risk).     Andee Bamberger DNP, 3:43 PM 03/10/2024

## 2024-03-11 ENCOUNTER — Telehealth: Payer: Self-pay | Admitting: *Deleted

## 2024-03-11 DIAGNOSIS — J301 Allergic rhinitis due to pollen: Secondary | ICD-10-CM | POA: Diagnosis not present

## 2024-03-11 DIAGNOSIS — J3089 Other allergic rhinitis: Secondary | ICD-10-CM | POA: Diagnosis not present

## 2024-03-11 DIAGNOSIS — J3081 Allergic rhinitis due to animal (cat) (dog) hair and dander: Secondary | ICD-10-CM | POA: Diagnosis not present

## 2024-03-11 NOTE — Telephone Encounter (Signed)
 Copied from CRM [id number]. Topic: Clinical - Medication Question >> Mar 11, 2024 10:01 AM Marlan Silva wrote: Reason for CRM: Patient is requesting for Pharmacist to call her at the number file.

## 2024-03-11 NOTE — Telephone Encounter (Signed)
Attempted to contact patient.  Had to leave a voicemail.

## 2024-03-11 NOTE — Progress Notes (Signed)
   03/11/2024  Patient ID: Brianna Mccarty, female   DOB: Nov 29, 1940, 83 y.o.   MRN: [id number]  Patient returned my call. States she is having issues with refilling her nexlizet . Chart review shows it needed a prior auth last month which probably caused some delays.  Contacted patient pharmacy, confirmed medication goes through for no charge and patient is aware she can pick up at her earliest convenience.  Carnell Christian, PharmD Clinical Pharmacist [phone]

## 2024-03-12 ENCOUNTER — Telehealth: Payer: Self-pay | Admitting: Internal Medicine

## 2024-03-12 NOTE — Telephone Encounter (Signed)
 Pt c/o BP issue: STAT if pt c/o blurred vision, one-sided weakness or slurred speech.  STAT if BP is GREATER than 180/120 TODAY.  STAT if BP is LESS than 90/60 and SYMPTOMATIC TODAY  1. What is your BP concern?   Patient is concerned her BP has been trending high  2. Have you taken any BP medication today?  Yes - prazosin  (MINIPRESS ) 5 MG capsule   3. What are your last 5 BP readings?  Not available  4. Are you having any other symptoms (ex. Dizziness, headache, blurred vision, passed out)?  Patient stated yesterday she felt lightheaded but did not pass out  Patient stated she was in the hospital for 3 days last week for afib.  Patient stated she is concerned her BP has been trending high and wants to know if she should take extra BP medication.

## 2024-03-12 NOTE — Telephone Encounter (Signed)
 Spoke to patient she stated she was recently in [facility] hospital with afib.Stated she is concerned her B/P has been elevated.This morning 148/84 pulse 80.At present 136/67 pulse 54.She feels tired,no energy.Medications reviewed and are listed correctly in chart. Advised to continue same medications.Appointment scheduled with Dr.Acharya 5/13 at 10:20 am.Advised to monitor B/P daily and bring readings to appointment.I will make Dr.Acharya aware.

## 2024-03-13 ENCOUNTER — Other Ambulatory Visit: Payer: Self-pay | Admitting: Family Medicine

## 2024-03-16 ENCOUNTER — Inpatient Hospital Stay: Admitting: Family Medicine

## 2024-03-16 ENCOUNTER — Ambulatory Visit: Attending: Internal Medicine | Admitting: Internal Medicine

## 2024-03-16 ENCOUNTER — Encounter: Payer: Self-pay | Admitting: Internal Medicine

## 2024-03-16 VITALS — BP 146/72 | HR 56 | Ht 60.0 in | Wt 162.0 lb

## 2024-03-16 DIAGNOSIS — I340 Nonrheumatic mitral (valve) insufficiency: Secondary | ICD-10-CM | POA: Diagnosis not present

## 2024-03-16 DIAGNOSIS — I10 Essential (primary) hypertension: Secondary | ICD-10-CM | POA: Diagnosis not present

## 2024-03-16 DIAGNOSIS — D6869 Other thrombophilia: Secondary | ICD-10-CM | POA: Diagnosis not present

## 2024-03-16 DIAGNOSIS — I5033 Acute on chronic diastolic (congestive) heart failure: Secondary | ICD-10-CM | POA: Diagnosis not present

## 2024-03-16 DIAGNOSIS — I480 Paroxysmal atrial fibrillation: Secondary | ICD-10-CM

## 2024-03-16 DIAGNOSIS — I6523 Occlusion and stenosis of bilateral carotid arteries: Secondary | ICD-10-CM

## 2024-03-16 DIAGNOSIS — R5383 Other fatigue: Secondary | ICD-10-CM | POA: Diagnosis not present

## 2024-03-16 DIAGNOSIS — I350 Nonrheumatic aortic (valve) stenosis: Secondary | ICD-10-CM | POA: Diagnosis not present

## 2024-03-16 NOTE — Patient Instructions (Addendum)
 Medication Instructions:  Please remember to take your Hydralazine  50 mg as needed once daily for a Blood pressure GREATER than 160/100  Lab Work: None  Follow-Up: At [facility], you and your health needs are our priority.  As part of our continuing mission to provide you with exceptional heart care, our providers are all part of one team.  This team includes your primary Cardiologist (physician) and Advanced Practice Providers or APPs (Physician Assistants and Nurse Practitioners) who all work together to provide you with the care you need, when you need it.  Your next appointment:   3 month(s) follow up visit on 06/24/2024 at 1:20 pm  Provider:   Dr. Gayatri Acharya  Other Instructions Please call us  or send a MyChart message with any Cardiology related questions/concerns.  [phone].  Thank you!

## 2024-03-16 NOTE — Progress Notes (Signed)
 Cardiology Office Note:  .   Date:  03/16/2024  ID:  Brianna Mccarty, DOB Oct 13, 1941, MRN [id number] PCP: Aida House, MD  [facility] Providers Cardiologist:  Euell Herrlich, MD    History of Present Illness: .   Brianna Mccarty is a 83 y.o. female.  Discussed the use of AI scribe software for clinical note transcription with the patient, who gave verbal consent to proceed.  History of Present Illness Brianna Mccarty "Brianna Mccarty" is an 83 year old female with atrial fibrillation and hypertension who presents with fatigue and elevated blood pressure.  She experiences persistent fatigue and low energy levels, with occasional transient improvement. Shortness of breath occurs when walking across the room, with oxygen  levels ranging from 92 to 97%. Last Wednesday, she experienced weakness and low energy at a drugstore, leading to a near-syncope, which resolved with rest. Her blood pressure has been slightly elevated, with a noticeable sensation when it is high. It does not drop except at nighttime. She was previously on amlodipine , which was replaced by diltiazem .  She has atrial fibrillation, previously treated with cardioversion, and is currently on diltiazem  180 mg daily, apixaban  5 mg twice daily, losartan  100 mg daily, and prazosin  5 mg twice daily. Furosemide  is taken as needed for swelling. She has not used her treadmill for a couple of months due to symptoms but plans to resume exercise. She monitors her fluid status and reports no significant fluid retention, taking a diuretic as needed. She recently resumed Nexlizant for cholesterol management after a two-month gap due to difficulty getting the medication from the pharmacy.    ROS: negative except per HPI above.  Studies Reviewed: Aaron Aas   EKG Interpretation Date/Time:  Tuesday Mar 16 2024 10:29:45 EDT Ventricular Rate:  56 PR Interval:  166 QRS Duration:  94 QT Interval:  462 QTC Calculation: 445 R Axis:   -65  Text  Interpretation: Sinus bradycardia Left axis deviation Low voltage QRS Cannot rule out Anterior infarct (cited on or before 05-Mar-2024) When compared with ECG of 05-Mar-2024 11:52, Premature atrial complexes are no longer Present Confirmed by Grady Lawman ([id number]) on 03/16/2024 11:20:56 AM    Results DIAGNOSTIC EKG: Normal TEE: Moderate mitral regurgitation, moderate aortic stenosis Risk Assessment/Calculations:    CHA2DS2-VASc Score = 4   This indicates a 4.8% annual risk of stroke. The patient's score is based upon: CHF History: 0 HTN History: 1 Diabetes History: 0 Stroke History: 0 Vascular Disease History: 0 Age Score: 2 Gender Score: 1      Physical Exam:   VS:  BP (!) 146/72 (BP Location: Left Arm, Patient Position: Sitting, Cuff Size: Large)   Pulse (!) 56   Ht 5' (1.524 m)   Wt 162 lb (73.5 kg)   SpO2 97%   BMI 31.64 kg/m    Wt Readings from Last 3 Encounters:  03/23/24 159 lb 9.6 oz (72.4 kg)  03/16/24 162 lb (73.5 kg)  03/10/24 159 lb (72.1 kg)     Physical Exam GENERAL: Alert, cooperative, well developed, no acute distress. HEENT: Normocephalic, normal oropharynx, moist mucous membranes. CHEST: Clear to auscultation bilaterally, no wheezes, rhonchi, or crackles. CARDIOVASCULAR: Normal heart rate and rhythm, S1 and S2 normal 2/6 systolic murmur at RUSB and apex ABDOMEN: Soft, non-tender, non-distended, without organomegaly, normal bowel sounds. EXTREMITIES: No cyanosis or edema. NEUROLOGICAL: Cranial nerves grossly intact, moves all extremities without gross motor or sensory deficit.   ASSESSMENT AND PLAN: .    Assessment and  Plan Assessment & Plan Atrial fibrillation IVCD, asymptomatic Secondary hypercoagulable state AFib cardioverted, maintained in sinus rhythm. Occasional bradycardia possibly due to diltiazem . - Continue diltiazem  180 mg daily. - Monitor for exercise intolerance on treadmill. Adjust diltiazem  if symptoms occur. - Consider  electrophysiologist referral if syncope or arrhythmia recur in setting of conduction system disease. -refill eliquis  5 mg BID for a year  Hypertension Slightly elevated blood pressure. Current regimen includes losartan  and prazosin  and diltiazem . Amlodipine  discontinued for diltiazem . Caution against aggressive management due to age and conduction system disease - Continue losartan  100 mg daily and prazosin  5 mg twice daily. - Use hydralazine  50 mg if BP exceeds 160/100 mmHg. - Regularly monitor blood pressure and report significant changes.  Moderate mitral valve regurgitation and aortic valve stenosis Well-managed with no significant echocardiogram changes. Fatigue and dyspnea not valve-related. - Repeat echocardiogram in one year unless symptoms worsen.  Fatigue and exercise intolerance Persistent fatigue and exercise intolerance, improved post-discharge. Symptoms possibly due to decreased activity. No fluid retention. - Resume treadmill exercise gradually, starting with 5 minutes and increasing by 1 minute daily as tolerated. - Monitor for exercise intolerance symptoms, such as inability to increase heart rate. - Ensure safety measures during exercise, such as having a chair nearby.      Grady Lawman, MD, FACC

## 2024-03-22 ENCOUNTER — Other Ambulatory Visit: Payer: Self-pay | Admitting: General Surgery

## 2024-03-22 DIAGNOSIS — K862 Cyst of pancreas: Secondary | ICD-10-CM

## 2024-03-23 ENCOUNTER — Encounter: Payer: Self-pay | Admitting: Family Medicine

## 2024-03-23 ENCOUNTER — Ambulatory Visit (INDEPENDENT_AMBULATORY_CARE_PROVIDER_SITE_OTHER): Admitting: Family Medicine

## 2024-03-23 VITALS — BP 118/62 | HR 60 | Temp 98.0°F | Ht 60.0 in | Wt 159.6 lb

## 2024-03-23 DIAGNOSIS — I10 Essential (primary) hypertension: Secondary | ICD-10-CM

## 2024-03-23 DIAGNOSIS — J3089 Other allergic rhinitis: Secondary | ICD-10-CM | POA: Diagnosis not present

## 2024-03-23 DIAGNOSIS — I4811 Longstanding persistent atrial fibrillation: Secondary | ICD-10-CM | POA: Diagnosis not present

## 2024-03-23 DIAGNOSIS — J3081 Allergic rhinitis due to animal (cat) (dog) hair and dander: Secondary | ICD-10-CM | POA: Diagnosis not present

## 2024-03-23 DIAGNOSIS — J301 Allergic rhinitis due to pollen: Secondary | ICD-10-CM | POA: Diagnosis not present

## 2024-03-23 MED ORDER — APIXABAN 5 MG PO TABS: 5.0000 mg | ORAL_TABLET | Freq: Two times a day (BID) | ORAL | 1 refills | Status: DC

## 2024-03-23 MED ORDER — DILTIAZEM HCL ER COATED BEADS 180 MG PO CP24: 180.0000 mg | ORAL_CAPSULE | Freq: Every day | ORAL | 1 refills | Status: DC

## 2024-03-23 MED ORDER — PRAZOSIN HCL 5 MG PO CAPS: 5.0000 mg | ORAL_CAPSULE | Freq: Two times a day (BID) | ORAL | 1 refills | Status: DC

## 2024-03-23 NOTE — Progress Notes (Signed)
 Established Patient Office Visit  Subjective   Patient ID: Brianna Mccarty, female    DOB: 02/08/1941  Age: 83 y.o. MRN: [id number]  Chief Complaint  Patient presents with   Hospitalization Follow-up    Pt is here for hospital follow up. She reports that she was diagnosed with atrial fibrillation and was placed on cardizem  for rate control and the Eliquis  for stroke risk reduction. She reports she had gone to the ER on 4/30 due to the palpitations and chest discomfort. She was admitted until 03/06/24. She reports that she had an ECHO and she also has moderate AS, dilated LA, mildly reduced RV function. I spent 30 minutes reviewing the documentation, labs, ECHO report. She denies any SOB, no heart palpitations or chest pain at this time. Has already followed up  with the cardiologist. I performed a med reconciliation and updated the list  HTN -- BP in office performed and is well controlled. She  reports no side effects to the medications, no chest pain, SOB, dizziness or headaches. She has a BP cuff at home and is checking BP regularly, reports they are in the normal range.        Current Outpatient Medications  Medication Instructions   ADVAIR HFA 115-21 MCG/ACT inhaler 2 puffs, 2 times daily   albuterol  (PROAIR  HFA) 108 (90 Base) MCG/ACT inhaler 2 puffs, Inhalation, Every 6 hours PRN   alendronate  (FOSAMAX ) 70 MG tablet TAKE 1 TABLET EVERY 7 (SEVEN) DAYS. TAKE WITH A FULL GLASS OF WATER  ON AN EMPTY STOMACH.   apixaban  (ELIQUIS ) 5 mg, Oral, 2 times daily   azelastine  (ASTELIN ) 0.1 % nasal spray Instill 2 sprays into each nostril two times a day as needed for allergies   Bempedoic Acid -Ezetimibe  (NEXLIZET ) 180-10 MG TABS 1 tablet, Oral, Daily   betamethasone  valerate ointment (VALISONE ) 0.1 % 1 Application, Topical, 2 times daily   CALCIUM  PO 1,200 mg, Daily   Cholecalciferol 125 MCG (5000 UT) TABS 1 tablet, Daily   desmopressin  (DDAVP ) 0.2 mg, Daily at bedtime   diltiazem  (CARDIZEM   CD) 180 mg, Oral, Daily   EPINEPHrine  (EPI-PEN) 0.3 mg, As needed   fexofenadine (ALLEGRA) 180 mg, Daily   furosemide  (LASIX ) 20 mg, Oral, Daily PRN   hydrALAZINE  (APRESOLINE ) 50 mg, As needed   hydrOXYzine  (ATARAX ) 10 mg, Daily at bedtime   ipratropium-albuterol  (DUONEB) 0.5-2.5 (3) MG/3ML SOLN 3 mLs, Nebulization, Every 6 hours PRN   ketoconazole (NIZORAL) 2 % cream 1 Application, 2 times daily   losartan  (COZAAR ) 100 mg, Oral, Daily   MAGNESIUM  PO 250 mg, Daily   METAMUCIL FIBER PO 1 capsule, Daily   montelukast  (SINGULAIR ) 10 mg, Daily at bedtime   Multiple Vitamin (MULTIVITAMIN PO) 1 tablet, Daily   omeprazole  (PRILOSEC) 40 mg, Oral, Daily   OVER THE COUNTER MEDICATION Juice Plus-daily   OVER THE COUNTER MEDICATION [place] 3 oz once a day   prazosin  (MINIPRESS ) 5 mg, Oral, 2 times daily   Probiotic Product (PROBIOTIC PO) 1 capsule, Daily   vitamin B-12 (CYANOCOBALAMIN ) 100 mcg, Daily   vitamin C 1,000 mg, Daily    Patient Active Problem List   Diagnosis Date Noted   Atrial fibrillation (HCC) 03/05/2024   Acute on chronic diastolic CHF (congestive heart failure) (HCC) 03/04/2024   Hypomagnesemia 03/04/2024   Atrial flutter (HCC) 03/03/2024   Abnormal chest x-ray 03/03/2024   Impacted cerumen of both ears 08/05/2023   Acute diverticulitis 07/11/2022   SVT (supraventricular tachycardia) (HCC) 02/15/2022   Palpitations  01/15/2022   Aortic stenosis 01/15/2022   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   Genetic testing 09/13/2020   Family history of pancreatic cancer 09/05/2020   Family history of colon cancer 09/05/2020   Family history of cancer of extrahepatic bile ducts 09/05/2020   Fever 07/26/2020   History of COVID-19 07/26/2020   History of melanoma 05/12/2020   Macular degeneration 02/17/2019   OSA (obstructive sleep apnea) 02/12/2018   Prediabetes 02/12/2018   H/O cold sores  11/27/2015   Murmur 10/20/2015   Carotid artery disease (HCC)    Thyroid  cyst    Venous (peripheral) insufficiency 06/02/2009   Asthma 04/27/2008   Melanoma of skin (HCC) 02/10/2008   Allergic rhinitis 02/10/2008   INTERSTITIAL CYSTITIS 02/10/2008   Pure hypercholesterolemia 02/09/2008   Essential hypertension 02/09/2008   GERD 02/09/2008   Osteoporosis 02/09/2008      Review of Systems  All other systems reviewed and are negative.     Objective:     BP 118/62   Pulse 60   Temp 98 F (36.7 C) (Oral)   Ht 5' (1.524 m)   Wt 159 lb 9.6 oz (72.4 kg)   SpO2 96%   BMI 31.17 kg/m    Physical Exam Vitals reviewed.  Constitutional:      Appearance: Normal appearance. She is well-groomed and normal weight.  Eyes:     Conjunctiva/sclera: Conjunctivae normal.  Cardiovascular:     Rate and Rhythm: Normal rate and regular rhythm.     Pulses: Normal pulses.     Heart sounds: S1 normal and S2 normal.  Pulmonary:     Effort: Pulmonary effort is normal.     Breath sounds: Normal breath sounds and air entry.  Musculoskeletal:     Right lower leg: No edema.     Left lower leg: No edema.  Neurological:     Mental Status: She is alert and oriented to person, place, and time. Mental status is at baseline.     Gait: Gait is intact.  Psychiatric:        Mood and Affect: Mood and affect normal.        Speech: Speech normal.        Behavior: Behavior normal.        Judgment: Judgment normal.      No results found for any visits on 03/23/24.    The ASCVD Risk score (Arnett DK, et al., 2019) failed to calculate for the following reasons:   The 2019 ASCVD risk score is only valid for ages 23 to 6    Assessment & Plan:  Longstanding persistent atrial fibrillation Brianna Mccarty) Assessment & Plan: New diagnosis. I have refilled her eliquis  and cardizem  for rate control. HR today is irregular but well controlled. She has already followed up with the cardiologist on 5/13, note  reviewed with patient.   Orders: -     Apixaban ; Take 1 tablet (5 mg total) by mouth 2 (two) times daily.  Dispense: 180 tablet; Refill: 1 -     dilTIAZem  HCl ER Coated Beads; Take 1 capsule (180 mg total) by mouth daily.  Dispense: 90 capsule; Refill: 1  Essential hypertension Assessment & Plan: Current hypertension medications:       Sig   furosemide  (LASIX ) 20 MG tablet (Taking As Needed) Take 1 tablet (20 mg total) by mouth daily as needed for edema.   hydrALAZINE  (APRESOLINE ) 50 MG  tablet (Taking As Needed) Take 50 mg by mouth as needed.   losartan  (COZAAR ) 100 MG tablet (Taking) TAKE 1 TABLET BY MOUTH EVERY DAY   diltiazem  (CARDIZEM  CD) 180 MG 24 hr capsule Take 1 capsule (180 mg total) by mouth daily.   prazosin  (MINIPRESS ) 5 MG capsule Take 1 capsule (5 mg total) by mouth 2 (two) times daily.      Chronic, stable, well controlled, continue the above medications as prescribed, refilled patient's prazosin  today.   Orders: -     Prazosin  HCl; Take 1 capsule (5 mg total) by mouth 2 (two) times daily.  Dispense: 180 capsule; Refill: 1     Return in about 3 months (around 06/28/2024) for follow up, end of august or early september.    Aida House, MD

## 2024-03-25 ENCOUNTER — Ambulatory Visit: Admitting: General Practice

## 2024-03-27 NOTE — Assessment & Plan Note (Signed)
 Current hypertension medications:       Sig   furosemide  (LASIX ) 20 MG tablet (Taking As Needed) Take 1 tablet (20 mg total) by mouth daily as needed for edema.   hydrALAZINE  (APRESOLINE ) 50 MG tablet (Taking As Needed) Take 50 mg by mouth as needed.   losartan  (COZAAR ) 100 MG tablet (Taking) TAKE 1 TABLET BY MOUTH EVERY DAY   diltiazem  (CARDIZEM  CD) 180 MG 24 hr capsule Take 1 capsule (180 mg total) by mouth daily.   prazosin  (MINIPRESS ) 5 MG capsule Take 1 capsule (5 mg total) by mouth 2 (two) times daily.      Chronic, stable, well controlled, continue the above medications as prescribed, refilled patient's prazosin  today.

## 2024-03-27 NOTE — Assessment & Plan Note (Addendum)
 New diagnosis. I have refilled her eliquis  and cardizem  for rate control. HR today is irregular but well controlled. She has already followed up with the cardiologist on 5/13, note reviewed with patient.

## 2024-04-01 DIAGNOSIS — J3081 Allergic rhinitis due to animal (cat) (dog) hair and dander: Secondary | ICD-10-CM | POA: Diagnosis not present

## 2024-04-01 DIAGNOSIS — J301 Allergic rhinitis due to pollen: Secondary | ICD-10-CM | POA: Diagnosis not present

## 2024-04-01 DIAGNOSIS — J3089 Other allergic rhinitis: Secondary | ICD-10-CM | POA: Diagnosis not present

## 2024-04-13 ENCOUNTER — Ambulatory Visit (INDEPENDENT_AMBULATORY_CARE_PROVIDER_SITE_OTHER): Admitting: Otolaryngology

## 2024-04-13 VITALS — HR 66 | Ht 60.0 in | Wt 160.0 lb

## 2024-04-13 DIAGNOSIS — H6123 Impacted cerumen, bilateral: Secondary | ICD-10-CM

## 2024-04-14 DIAGNOSIS — J3081 Allergic rhinitis due to animal (cat) (dog) hair and dander: Secondary | ICD-10-CM | POA: Diagnosis not present

## 2024-04-14 DIAGNOSIS — J301 Allergic rhinitis due to pollen: Secondary | ICD-10-CM | POA: Diagnosis not present

## 2024-04-14 DIAGNOSIS — J3089 Other allergic rhinitis: Secondary | ICD-10-CM | POA: Diagnosis not present

## 2024-04-14 NOTE — Progress Notes (Signed)
 Patient ID: Brianna Mccarty, female   DOB: 1940-11-20, 83 y.o.   MRN: [id number]  Procedure: Bilateral cerumen disimpaction.   Indication: Cerumen impaction, resulting in ear discomfort and conductive hearing loss.   Description: The patient is placed supine on the operating table. Under the operating microscope, the right ear canal is examined and is noted to be impacted with cerumen. The cerumen is carefully removed with a combination of suction catheters, cerumen curette, and alligator forceps. After the cerumen removal, the ear canal and tympanic membrane are noted to be normal. No middle ear effusion is noted. The same procedure is then repeated on the left side without exception. The patient tolerated the procedure well.  Follow-up care:  The patient will follow up in 6 months.

## 2024-04-19 DIAGNOSIS — M170 Bilateral primary osteoarthritis of knee: Secondary | ICD-10-CM | POA: Diagnosis not present

## 2024-04-21 DIAGNOSIS — J3089 Other allergic rhinitis: Secondary | ICD-10-CM | POA: Diagnosis not present

## 2024-04-21 DIAGNOSIS — J3081 Allergic rhinitis due to animal (cat) (dog) hair and dander: Secondary | ICD-10-CM | POA: Diagnosis not present

## 2024-04-21 DIAGNOSIS — J301 Allergic rhinitis due to pollen: Secondary | ICD-10-CM | POA: Diagnosis not present

## 2024-04-22 DIAGNOSIS — Z1231 Encounter for screening mammogram for malignant neoplasm of breast: Secondary | ICD-10-CM | POA: Diagnosis not present

## 2024-04-22 LAB — HM MAMMOGRAPHY

## 2024-04-26 DIAGNOSIS — M170 Bilateral primary osteoarthritis of knee: Secondary | ICD-10-CM | POA: Diagnosis not present

## 2024-04-27 ENCOUNTER — Ambulatory Visit
Admission: RE | Admit: 2024-04-27 | Discharge: 2024-04-27 | Disposition: A | Discharge status: Home / Self Care | Source: Ambulatory Visit | Attending: General Surgery | Admitting: General Surgery

## 2024-04-27 DIAGNOSIS — K862 Cyst of pancreas: Secondary | ICD-10-CM | POA: Diagnosis not present

## 2024-04-27 DIAGNOSIS — N281 Cyst of kidney, acquired: Secondary | ICD-10-CM | POA: Diagnosis not present

## 2024-04-27 DIAGNOSIS — D7389 Other diseases of spleen: Secondary | ICD-10-CM | POA: Diagnosis not present

## 2024-04-27 MED ORDER — GADOPICLENOL 0.5 MMOL/ML IV SOLN
7.0000 mL | Freq: Once | INTRAVENOUS | Status: AC | PRN
  Administered 2024-04-27: 7 mL via INTRAVENOUS

## 2024-05-03 DIAGNOSIS — M170 Bilateral primary osteoarthritis of knee: Secondary | ICD-10-CM | POA: Diagnosis not present

## 2024-05-04 ENCOUNTER — Ambulatory Visit (INDEPENDENT_AMBULATORY_CARE_PROVIDER_SITE_OTHER): Admitting: Family Medicine

## 2024-05-04 ENCOUNTER — Encounter: Payer: Self-pay | Admitting: Family Medicine

## 2024-05-04 ENCOUNTER — Encounter: Payer: HMO | Admitting: Family Medicine

## 2024-05-04 VITALS — BP 122/63 | HR 57

## 2024-05-04 DIAGNOSIS — Z0000 Encounter for general adult medical examination without abnormal findings: Secondary | ICD-10-CM

## 2024-05-04 DIAGNOSIS — R7303 Prediabetes: Secondary | ICD-10-CM

## 2024-05-04 NOTE — Progress Notes (Signed)
 Subjective:   Brianna Mccarty is a 83 y.o. female who presents for Medicare Annual (Subsequent) preventive examination.  Visit Complete: Virtual I connected with  Cathlean VEAR Christmas on 05/04/24 by a video and audio enabled telemedicine application and verified that I am speaking with the correct person using two identifiers.  Patient Location: Home  Provider Location: Office/Clinic  I discussed the limitations of evaluation and management by telemedicine. The patient expressed understanding and agreed to proceed.  Vital Signs: Because this visit was a virtual/telehealth visit, some criteria may be missing or patient reported. Any vitals not documented were not able to be obtained and vitals that have been documented are patient reported.  Patient Medicare AWV questionnaire was completed by the patient on 05/04/2024; I have confirmed that all information answered by patient is correct and no changes since this date.        Objective:    Today's Vitals   05/04/24 1455  BP: 122/63  Pulse: (!) 57   There is no height or weight on file to calculate BMI.     05/04/2024    3:20 PM 03/03/2024    2:54 AM 02/27/2024    8:03 PM 04/23/2023   11:44 AM 05/17/2022    4:01 PM 04/15/2022    2:03 PM 08/01/2021   10:49 AM  Advanced Directives  Does Patient Have a Medical Advance Directive? Yes No No Yes Yes Yes Yes  Type of Clinical research associate of [place];Living will Healthcare Power of [place];Living will Healthcare Power of [place];Living will Healthcare Power of [place];Living will  Does patient want to make changes to medical advance directive? No - Patient declined   No - Patient declined   No - Patient declined  Copy of Healthcare Power of Attorney in Chart? Yes - validated most recent copy scanned in chart (See row information)   Yes - validated most recent copy scanned in chart (See row information)  Yes - validated most recent copy scanned in  chart (See row information) Yes - validated most recent copy scanned in chart (See row information)  Would patient like information on creating a medical advance directive?  No - Patient declined No - Patient declined        Current Medications (verified) Outpatient Encounter Medications as of 05/04/2024  Medication Sig   ADVAIR HFA 115-21 MCG/ACT inhaler Inhale 2 puffs into the lungs 2 (two) times daily.   albuterol  (PROAIR  HFA) 108 (90 Base) MCG/ACT inhaler Inhale 2 puffs into the lungs every 6 (six) hours as needed for wheezing.   alendronate  (FOSAMAX ) 70 MG tablet TAKE 1 TABLET EVERY 7 (SEVEN) DAYS. TAKE WITH A FULL GLASS OF WATER  ON AN EMPTY STOMACH.   apixaban  (ELIQUIS ) 5 MG TABS tablet Take 1 tablet (5 mg total) by mouth 2 (two) times daily.   Ascorbic Acid (VITAMIN C) 1000 MG tablet Take 1,000 mg by mouth daily.   azelastine  (ASTELIN ) 0.1 % nasal spray Instill 2 sprays into each nostril two times a day as needed for allergies   Bempedoic Acid -Ezetimibe  (NEXLIZET ) 180-10 MG TABS Take 1 tablet by mouth daily.   CALCIUM  PO Take 1,200 mg by mouth daily.   Cholecalciferol 125 MCG (5000 UT) TABS Take 1 tablet by mouth daily.   desmopressin  (DDAVP ) 0.2 MG tablet Take 0.2 mg by mouth at bedtime.   diltiazem  (CARDIZEM  CD) 180 MG 24 hr capsule Take 1 capsule (180 mg total) by mouth daily.  EPINEPHrine  0.3 mg/0.3 mL IJ SOAJ injection Inject 0.3 mg into the muscle as needed for anaphylaxis.   fexofenadine (ALLEGRA) 180 MG tablet Take 180 mg by mouth daily.   furosemide  (LASIX ) 20 MG tablet Take 1 tablet (20 mg total) by mouth daily as needed for edema.   hydrALAZINE  (APRESOLINE ) 50 MG tablet Take 50 mg by mouth as needed.   hydrOXYzine  (ATARAX /VISTARIL ) 10 MG tablet Take 10 mg by mouth at bedtime.    ipratropium-albuterol  (DUONEB) 0.5-2.5 (3) MG/3ML SOLN Take 3 mLs by nebulization every 6 (six) hours as needed.   ketoconazole (NIZORAL) 2 % cream Apply 1 Application topically 2 (two) times daily.    losartan  (COZAAR ) 100 MG tablet TAKE 1 TABLET BY MOUTH EVERY DAY   MAGNESIUM  PO Take 250 mg by mouth daily.   METAMUCIL FIBER PO Take 1 capsule by mouth daily.   montelukast  (SINGULAIR ) 10 MG tablet Take 10 mg by mouth at bedtime.   Multiple Vitamin (MULTIVITAMIN PO) Take 1 tablet by mouth daily.   omeprazole  (PRILOSEC) 40 MG capsule TAKE 1 CAPSULE (40 MG TOTAL) BY MOUTH DAILY.   OVER THE COUNTER MEDICATION Juice Plus-daily   OVER THE COUNTER MEDICATION Nopolea 3 oz once a day   prazosin  (MINIPRESS ) 5 MG capsule Take 1 capsule (5 mg total) by mouth 2 (two) times daily.   Probiotic Product (PROBIOTIC PO) Take 1 capsule by mouth daily.    vitamin B-12 (CYANOCOBALAMIN ) 100 MCG tablet Take 100 mcg by mouth daily.   [DISCONTINUED] betamethasone  valerate ointment (VALISONE ) 0.1 % Apply 1 Application topically 2 (two) times daily.   No facility-administered encounter medications on file as of 05/04/2024.    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 mild by carotid duplex.   Cervical dysplasia 1971   Coronary artery disease    Diverticulosis of colon    DJD (degenerative joint disease)    Family history of cancer of extrahepatic bile ducts 09/05/2020   Family history of colon cancer 09/05/2020   Family history of pancreatic cancer 09/05/2020   GERD (gastroesophageal reflux disease)    Headache(784.0)    History of kidney stones    Hypercholesterolemia    Hypertension    Interstitial cystitis    sees urologist   Lichen sclerosus    Malignant melanoma  (HCC) 2006   sees Dr. Shona in dermatology   Migraines    Mild aortic stenosis    Mitral valve disease    Question mitral valve prolapse in the past, no prolapse by echo 2009   Murmur 10/20/2015   SEES DR NELSON   OSA (obstructive sleep apnea)    USES DENTAL DEVICE   Osteoporosis    on fosomax > 5 years, stopped 11/2015   Pneumonia    SVT (supraventricular tachycardia) (HCC) 02/15/2022   Monitor 02/2022: NSR, avg HR 61; 2 runs of NSVT (4 beats); several runs of Supraventricular Tachycardia (longest 311'); no AFib   Thyroid  cyst    1 x 1.1 thyroid  cyst noted on carotid Doppler, January, 2012   Venous insufficiency  Past Surgical History:  Procedure Laterality Date   BREAST BIOPSY Left 11/25/2011   U/S core, benign performed at [facility], LEFT BREAT MARKER IN   BREAST CYST ASPIRATION     BREAST SURGERY  2013   Breast Bx-Benign   CARDIAC CATHETERIZATION     CARDIOVERSION N/A 03/05/2024   Procedure: CARDIOVERSION;  Surgeon: Loni Soyla LABOR, MD;  Location: [facility] INVASIVE CV LAB;  Service: Cardiovascular;  Laterality: N/A;   CATARACT EXTRACTION, BILATERAL     CHOLECYSTECTOMY N/A 12/22/2019   Procedure: LAPAROSCOPIC CHOLECYSTECTOMY;  Surgeon: Teresa Lonni HERO, MD;  Location: [facility];  Service: General;  Laterality: N/A;   cystoscopy and basket stone removal right ureter  02/2006   Dr. Andra   GALLBLADDER SURGERY  12/22/2019   GYNECOLOGIC CRYOSURGERY  1971   left total hip replacement  2004   Dr. Heide   melanoma removed from medial rleft knee area  2006   Dr. Shona   NASAL SEPTUM SURGERY     TOTAL HIP ARTHROPLASTY Right 08/01/2021   Procedure: TOTAL HIP ARTHROPLASTY ANTERIOR APPROACH;  Surgeon: Melodi Lerner, MD;  Location: [facility];  Service: Orthopedics;  Laterality: Right;   TRANSESOPHAGEAL ECHOCARDIOGRAM (CATH LAB) N/A 03/05/2024   Procedure: TRANSESOPHAGEAL ECHOCARDIOGRAM;  Surgeon: Loni Soyla LABOR, MD;  Location: [facility] INVASIVE CV LAB;  Service:  Cardiovascular;  Laterality: N/A;   Family History  Problem Relation Age of Onset   Pancreatic cancer Father 46   Diabetes Mother    Heart disease Mother    Cancer Brother 59       Bile duct   Diabetes Brother    Hypertension Brother    Diabetes Brother    Heart disease Brother    Hypertension Brother    Hypertension Sister    Hypertension Sister    Colon cancer Paternal Uncle        dx 20s   Lung cancer Paternal Uncle        dx 66s   Social History   Socioeconomic History   Marital status: Married    Spouse name: Lang   Number of children: 2   Years of education: Not on file   Highest education level: Not on file  Occupational History   Occupation: retired    Associate Professor: RETIRED  Tobacco Use   Smoking status: Never    Passive exposure: Never   Smokeless tobacco: Never  Vaping Use   Vaping status: Never Used  Substance and Sexual Activity   Alcohol use: Yes    Alcohol/week: 1.0 standard drink of alcohol    Types: 1 Standard drinks or equivalent per week    Comment:  OCC WINE   Drug use: No   Sexual activity: Not Currently    Partners: Male    Birth control/protection: Post-menopausal    Comment: 1st intercourse 77 yo-1 partner  Other Topics Concern   Not on file  Social History Narrative   Updated 12/13/2019   Work or School: none      Home Situation: lives with husband      Spiritual Beliefs: christian      Lifestyle: regular exercise; healthy diet      12/13/2019: Uses treadmill 3/x weekly.    Looks after sister who has been having memory decline, as well  as husband with minor memory decline.    Enjoys writing poetry, reading, going to church, travel   Social Drivers of [employer]: Low Risk  (05/04/2024)   Overall Financial  Resource Strain (CARDIA)    Difficulty of Paying Living Expenses: Not hard at all  Food Insecurity: No Food Insecurity (05/04/2024)   Hunger Vital Sign    Worried About Running Out of Food in the Last Year:  Never true    Ran Out of Food in the Last Year: Never true  Transportation Needs: No Transportation Needs (05/04/2024)   PRAPARE - Administrator, Civil Service (Medical): No    Lack of Transportation (Non-Medical): No  Physical Activity: Inactive (05/04/2024)   Exercise Vital Sign    Days of Exercise per Week: 0 days    Minutes of Exercise per Session: Not on file  Stress: No Stress Concern Present (05/04/2024)   [employer] of Occupational Health - Occupational Stress Questionnaire    Feeling of Stress: Not at all  Social Connections: Socially Integrated (05/04/2024)   Social Connection and Isolation Panel    Frequency of Communication with Friends and Family: More than three times a week    Frequency of Social Gatherings with Friends and Family: Twice a week    Attends Religious Services: More than 4 times per year    Active Member of [employer] or Organizations: Yes    Attends Engineer, structural: More than 4 times per year    Marital Status: Married    Tobacco Counseling Counseling given: Not Answered Pt is not a smoker so N/A  Clinical Intake:     Pain : No/denies pain     Nutritional Risks: None Diabetes: No            Activities of Daily Living    05/04/2024    3:14 PM 03/04/2024    9:47 AM  In your present state of health, do you have any difficulty performing the following activities:  Hearing? 1   Comment hearing loss is stable   Vision? 1   Comment has macular degeneration but stable   Difficulty concentrating or making decisions? 0   Walking or climbing stairs? 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 0   Doing errands, shopping? 0 0    Patient Care Team: Ozell Heron HERO, MD as PCP - General (Family Medicine) Loni Soyla LABOR, MD as PCP - Cardiology (Cardiology) Maranda Leim DEL, MD as Consulting Physician (Cardiology) Aneita Gwendlyn DASEN, MD (Inactive) as Consulting Physician (Gastroenterology) Nieves Cough, MD as Consulting Physician (Urology) Fleeta Smock, Lamar BROCKS, MD as Consulting Physician (Allergy and Immunology) Cleatus Collar, MD as Consulting Physician (Ophthalmology) Shona Rush, MD (Dermatology) Heide Ingle, MD as Consulting Physician (Orthopedic Surgery) Lionell Jon DEL, [facility] (Pharmacist) Loni Soyla LABOR, MD as Consulting Physician (Cardiology)  Indicate any recent Medical Services you may have received from other than [facility] providers in the past year (date may be approximate).     Assessment:   This is a routine wellness examination for Shonte.  Hearing/Vision screen No results found.   Goals Addressed   None    Depression Screen    05/04/2024    2:56 PM 04/23/2023   11:40 AM 09/02/2022    2:15 PM 06/28/2022   12:58 PM 04/15/2022    2:04 PM 03/20/2022    2:40 PM 02/26/2022    4:45 PM  PHQ 2/9 Scores  PHQ - 2 Score 0 0 0 0 0 3 0  PHQ- 9 Score 3   1  6 2     Fall Risk    05/04/2024    3:03 PM 04/23/2023  11:43 AM 03/04/2023    2:20 PM 04/15/2022    2:04 PM 02/26/2022    4:45 PM  Fall Risk   Falls in the past year? 0 0 0 0 0  Number falls in past yr: 0 0 0 0 0  Injury with Fall? 0 0 0 0 0  Risk for fall due to : Orthopedic patient No Fall Risks No Fall Risks Medication side effect No Fall Risks  Follow up Falls evaluation completed Falls prevention discussed  Falls evaluation completed;Education provided;Falls prevention discussed  Falls evaluation completed      Data saved with a previous flowsheet row definition    MEDICARE RISK AT HOME: Medicare Risk at Home Any stairs in or around the home?: No If so, are there any without handrails?: No Home free of loose throw rugs in walkways, pet beds, electrical cords, etc?: No Adequate lighting in your home to reduce risk of falls?: Yes Life alert?: No Use of a cane, walker or w/c?: No Grab bars in the bathroom?: Yes Shower chair or bench in shower?: Yes Elevated toilet seat or a handicapped toilet?:  Yes  TIMED UP AND GO:  Was the test performed?  No    Cognitive Function:    10/22/2017    5:19 PM 07/05/2016    2:37 PM  MMSE - Mini Mental State Exam  Not completed: -- --        05/04/2024    3:18 PM 04/23/2023   11:44 AM 04/15/2022    2:08 PM 04/15/2022    2:06 PM 03/27/2021    1:24 PM  6CIT Screen  What Year? 0 points 0 points 0 points 0 points 0 points  What month? 0 points 0 points 0 points 0 points 0 points  What time? 0 points 0 points 0 points 0 points 0 points  Count back from 20 0 points 0 points 0 points 0 points 0 points  Months in reverse 0 points 0 points 0 points 0 points 0 points  Repeat phrase 2 points 0 points 0 points 0 points 0 points  Total Score 2 points 0 points 0 points 0 points 0 points    Immunizations Immunization History  Administered Date(s) Administered   Fluad Quad(high Dose 65+) 07/02/2019   Influenza Split 09/18/2011, 08/11/2012, 07/28/2014   Influenza Whole 07/21/2008, 08/04/2009, 08/01/2010   Influenza, High Dose Seasonal PF 07/19/2013, 07/28/2015, 10/17/2016, 08/11/2017, 09/01/2017, 08/04/2018, 09/11/2018, 09/01/2019, 06/30/2021, 08/01/2022   Influenza,inj,Quad PF,6+ Mos 07/03/2016   Influenza-Unspecified 07/06/2023   Moderna Covid-19 Vaccine Bivalent Booster 70yrs & up 09/24/2021   PFIZER(Purple Top)SARS-COV-2 Vaccination 11/20/2019, 12/08/2019, 03/22/2020   Pneumococcal Conjugate-13 12/06/2013   Pneumococcal Polysaccharide-23 10/03/2008, 10/17/2016, 11/24/2017, 09/11/2018, 07/12/2019, 09/01/2019   Tdap 11/04/2008, 10/24/2015   Zoster Recombinant(Shingrix ) 07/30/2019, 09/03/2019, 02/21/2020   Zoster, Live 11/05/2003    TDAP status: Up to date  Flu Vaccine status: Up to date  Pneumococcal vaccine status: Up to date  Covid-19 vaccine status: Completed vaccines  Qualifies for Shingles Vaccine? Yes   Zostavax completed No   Shingrix  Completed?: Yes  Screening Tests Health Maintenance  Topic Date Due   COVID-19 Vaccine (5 -  2024-25 season) 07/06/2023   MAMMOGRAM  04/16/2024   INFLUENZA VACCINE  06/04/2024   Medicare Annual Wellness (AWV)  05/04/2025   DTaP/Tdap/Td (3 - Td or Tdap) 10/23/2025   Pneumococcal Vaccine: 50+ Years  Completed   DEXA SCAN  Completed   Zoster Vaccines- Shingrix   Completed   Hepatitis B Vaccines  Aged Out  HPV VACCINES  Aged Out   Meningococcal B Vaccine  Aged Out   Colonoscopy  Discontinued    Health Maintenance  Health Maintenance Due  Topic Date Due   COVID-19 Vaccine (5 - 2024-25 season) 07/06/2023   MAMMOGRAM  04/16/2024    Colorectal cancer screening: No longer required.   Mammogram status: Completed 2-3 weeks ago. Repeat every year  Bone Density status: Completed 12/10/2022. Results reflect: Bone density results: NORMAL. Repeat every 2 years. On currently on fosamax .  Lung Cancer Screening: (Low Dose CT Chest recommended if Age 68-80 years, 20 pack-year currently smoking OR have quit w/in 15years.) does not qualify.   Lung Cancer Screening Referral: N/A  Additional Screening:  Hepatitis C Screening: does not qualify; Completed   Vision Screening: Recommended annual ophthalmology exams for early detection of glaucoma and other disorders of the eye. Is the patient up to date with their annual eye exam?  Yes  Who is the provider or what is the name of the office in which the patient attends annual eye exams? [facility] If pt is not established with a provider, would they like to be referred to a provider to establish care? N/A.   Dental Screening: Recommended annual dental exams for proper oral hygiene   Community Resource Referral / Chronic Care Management: CRR required this visit?  No   CCM required this visit?  No     Plan:     I have personally reviewed and noted the following in the patient's chart:   Medical and social history Use of alcohol, tobacco or illicit drugs  Current medications and supplements including opioid prescriptions. Patient is  not currently taking opioid prescriptions. Functional ability and status Nutritional status Physical activity Advanced directives List of other physicians Hospitalizations, surgeries, and ER visits in previous 12 months Vitals Screenings to include cognitive, depression, and falls Referrals and appointments  In addition, I have reviewed and discussed with patient certain preventive protocols, quality metrics, and best practice recommendations. A written personalized care plan for preventive services as well as general preventive health recommendations were provided to patient.     Heron CHRISTELLA Sharper, MD   05/04/2024   After Visit Summary: (MyChart) Due to this being a telephonic visit, the after visit summary with patients personalized plan was offered to patient via MyChart

## 2024-05-05 DIAGNOSIS — J3081 Allergic rhinitis due to animal (cat) (dog) hair and dander: Secondary | ICD-10-CM | POA: Diagnosis not present

## 2024-05-05 DIAGNOSIS — J301 Allergic rhinitis due to pollen: Secondary | ICD-10-CM | POA: Diagnosis not present

## 2024-05-05 DIAGNOSIS — J3089 Other allergic rhinitis: Secondary | ICD-10-CM | POA: Diagnosis not present

## 2024-05-10 ENCOUNTER — Telehealth: Payer: Self-pay | Admitting: Family Medicine

## 2024-05-10 ENCOUNTER — Other Ambulatory Visit (INDEPENDENT_AMBULATORY_CARE_PROVIDER_SITE_OTHER)

## 2024-05-10 DIAGNOSIS — R7303 Prediabetes: Secondary | ICD-10-CM

## 2024-05-10 NOTE — Telephone Encounter (Signed)
 Patient states that Dr. Ozell said that she would look into her supplements for her and let her know if they interact with any of her medications.    -Joint Motion Flex - Turmeric/Coleus Phyllistine Extract/Black Pepper Extract -VisUltra- Vit A/Vit C/Vitamin D3/Zinc/Copper -Acufeno- Vit C/Niacin/Vit B-6/Folate/Vit B-12/[place]thorne Extract/Garlic Powder/Olivive Extract/Hibiscus Powder/Buchu Concentrate/Juniper Powder/Green Tea 50% Extract

## 2024-05-11 ENCOUNTER — Ambulatory Visit: Payer: Self-pay | Admitting: General Surgery

## 2024-05-11 ENCOUNTER — Ambulatory Visit: Payer: Self-pay | Admitting: Family Medicine

## 2024-05-11 LAB — HEMOGLOBIN A1C: Hgb A1c MFr Bld: 5.9 % (ref 4.6–6.5)

## 2024-05-12 ENCOUNTER — Ambulatory Visit: Payer: Self-pay | Admitting: Family Medicine

## 2024-05-12 DIAGNOSIS — J3081 Allergic rhinitis due to animal (cat) (dog) hair and dander: Secondary | ICD-10-CM | POA: Diagnosis not present

## 2024-05-12 DIAGNOSIS — J3089 Other allergic rhinitis: Secondary | ICD-10-CM | POA: Diagnosis not present

## 2024-05-12 DIAGNOSIS — J301 Allergic rhinitis due to pollen: Secondary | ICD-10-CM | POA: Diagnosis not present

## 2024-05-13 DIAGNOSIS — N3010 Interstitial cystitis (chronic) without hematuria: Secondary | ICD-10-CM | POA: Diagnosis not present

## 2024-05-13 DIAGNOSIS — N281 Cyst of kidney, acquired: Secondary | ICD-10-CM | POA: Diagnosis not present

## 2024-05-20 DIAGNOSIS — J3081 Allergic rhinitis due to animal (cat) (dog) hair and dander: Secondary | ICD-10-CM | POA: Diagnosis not present

## 2024-05-20 DIAGNOSIS — J301 Allergic rhinitis due to pollen: Secondary | ICD-10-CM | POA: Diagnosis not present

## 2024-05-20 DIAGNOSIS — J3089 Other allergic rhinitis: Secondary | ICD-10-CM | POA: Diagnosis not present

## 2024-05-25 ENCOUNTER — Telehealth: Payer: Self-pay | Admitting: Internal Medicine

## 2024-05-25 NOTE — Telephone Encounter (Signed)
 LVMTCB 05/25/24 at 09:50 am; Called both Mobile and Home numbers.

## 2024-05-25 NOTE — Telephone Encounter (Signed)
  Per answering service:  Patient has question regarding the dosage of medication she took.

## 2024-05-27 DIAGNOSIS — J3089 Other allergic rhinitis: Secondary | ICD-10-CM | POA: Diagnosis not present

## 2024-05-27 DIAGNOSIS — J301 Allergic rhinitis due to pollen: Secondary | ICD-10-CM | POA: Diagnosis not present

## 2024-05-27 DIAGNOSIS — J3081 Allergic rhinitis due to animal (cat) (dog) hair and dander: Secondary | ICD-10-CM | POA: Diagnosis not present

## 2024-06-01 DIAGNOSIS — Z85820 Personal history of malignant melanoma of skin: Secondary | ICD-10-CM | POA: Diagnosis not present

## 2024-06-01 DIAGNOSIS — D225 Melanocytic nevi of trunk: Secondary | ICD-10-CM | POA: Diagnosis not present

## 2024-06-01 DIAGNOSIS — Z1283 Encounter for screening for malignant neoplasm of skin: Secondary | ICD-10-CM | POA: Diagnosis not present

## 2024-06-01 DIAGNOSIS — Z08 Encounter for follow-up examination after completed treatment for malignant neoplasm: Secondary | ICD-10-CM | POA: Diagnosis not present

## 2024-06-02 ENCOUNTER — Telehealth: Payer: Self-pay | Admitting: Cardiology

## 2024-06-02 DIAGNOSIS — J301 Allergic rhinitis due to pollen: Secondary | ICD-10-CM | POA: Diagnosis not present

## 2024-06-02 DIAGNOSIS — J3089 Other allergic rhinitis: Secondary | ICD-10-CM | POA: Diagnosis not present

## 2024-06-02 NOTE — Telephone Encounter (Signed)
 Telephone outpatient note  Patient ID: JOLETTA MANNER MRN: [id number]; DOB: 10/05/1941 [facility] Cardiologist:  Soyla DELENA Merck, MD       RENDI MAPEL is a 83 y.o. female with a hx of afib.  Patient had called in requesting to speak to the overnight cardiologist for HR 108, BP 154/90. Called patient at 2330.   Patient reports she was on the run all day. Fell asleep at around 9pm and woke up at 10:30pm and took her night time medications including her: diltiazem  180mg  qAM, apixaban  5mg  bid, prazosin  5mg  bid, losartan  100mg  at bedtime (patient unsure of dose)   Patient called in because her HR 154/90, HR 108, which was concerning for her. SBP normally 110/50s, HR in typically 50-60s. Able to palpate her HR, which she was able to feel in her right wrist; she reported it felt regular. Patient denied any chest pain, lightheadedness, dizziness, shortness of breath.  Recommended that a BP of 154/90, HR 108 at one instance can be normal. Recommended that as long as she did not feel any change in her symptoms including chest pain, shortness of breath, lightheadedness, dizziness, it is ok to continue to check her bp/hr regularly and keep track of them. If she were to develop new symptoms or develop a HR consistently above 110 or one that became irregular, to reach back out.   Will notify patient's cardiologist, Dr. Merck.     Current Outpatient Medications  Medication Instructions   ADVAIR HFA 115-21 MCG/ACT inhaler 2 puffs, 2 times daily   albuterol  (PROAIR  HFA) 108 (90 Base) MCG/ACT inhaler 2 puffs, Inhalation, Every 6 hours PRN   alendronate  (FOSAMAX ) 70 MG tablet TAKE 1 TABLET EVERY 7 (SEVEN) DAYS. TAKE WITH A FULL GLASS OF WATER  ON AN EMPTY STOMACH.   apixaban  (ELIQUIS ) 5 mg, Oral, 2 times daily   azelastine  (ASTELIN ) 0.1 % nasal spray Instill 2 sprays into each nostril two times a day as needed for allergies   Bempedoic Acid -Ezetimibe  (NEXLIZET ) 180-10 MG TABS 1  tablet, Oral, Daily   CALCIUM  PO 1,200 mg, Daily   Cholecalciferol 125 MCG (5000 UT) TABS 1 tablet, Daily   desmopressin  (DDAVP ) 0.2 mg, Daily at bedtime   diltiazem  (CARDIZEM  CD) 180 mg, Oral, Daily   EPINEPHrine  (EPI-PEN) 0.3 mg, As needed   fexofenadine (ALLEGRA) 180 mg, Daily   furosemide  (LASIX ) 20 mg, Oral, Daily PRN   hydrALAZINE  (APRESOLINE ) 50 mg, As needed   hydrOXYzine  (ATARAX ) 10 mg, Daily at bedtime   ipratropium-albuterol  (DUONEB) 0.5-2.5 (3) MG/3ML SOLN 3 mLs, Nebulization, Every 6 hours PRN   ketoconazole (NIZORAL) 2 % cream 1 Application, 2 times daily   losartan  (COZAAR ) 100 mg, Oral, Daily   MAGNESIUM  PO 250 mg, Daily   METAMUCIL FIBER PO 1 capsule, Daily   montelukast  (SINGULAIR ) 10 mg, Daily at bedtime   Multiple Vitamin (MULTIVITAMIN PO) 1 tablet, Daily   omeprazole  (PRILOSEC) 40 mg, Oral, Daily   OVER THE COUNTER MEDICATION Juice Plus-daily   OVER THE COUNTER MEDICATION Nopolea 3 oz once a day   prazosin  (MINIPRESS ) 5 mg, Oral, 2 times daily   Probiotic Product (PROBIOTIC PO) 1 capsule, Daily   vitamin B-12 (CYANOCOBALAMIN ) 100 mcg, Daily   vitamin C 1,000 mg, Daily   Allergies  Allergen Reactions   Celebrex [Celecoxib] Diarrhea   Cephalexin     Reaction was a high fever  Other Reaction(s): Unknown   Hydrochlorothiazide      hyponatremia  Other Reaction(s): Unknown  Penicillin G Benzathine     Other Reaction(s): Unknown  Other Reaction(s): Not available  penicillin G benzathine   Penicillins Hives and Swelling    Swelling of arms & face  Has patient had a PCN reaction causing immediate rash, facial/tongue/throat swelling, SOB or lightheadedness with hypotension: Yes  Has patient had a PCN reaction causing severe rash involving mucus membranes or skin necrosis: No  Has patient had a PCN reaction that required hospitalization: No  Has patient had a PCN reaction occurring within the last 10 years: No  If all of the above answers are NO, then  may proceed with Cephalosporin use.  Other Reaction(s): Not available   Phenobarbital Hives    Other Reaction(s): Unknown   Statins     Muscle pain   Passion Fruit Flavoring Agent (Non-Screening) Diarrhea and Rash   Tape Rash    MEDICAL TAPE     For questions or updates, please contact [facility] Please consult [email or web address] for contact info under    Earlene CHRISTELLA Cluster, MD  06/02/2024 11:35 PM

## 2024-06-03 NOTE — Telephone Encounter (Signed)
 Called and spoke to pt; she states that the medication dose questions have been resolved and also she spoke to someone on 06/02/24 regarding a high BP/HR (154/90 and HR 108). She will keep a BP log and review it with Dr. Loni on 06/24/24. No other Concerns.

## 2024-06-07 ENCOUNTER — Encounter: Payer: Self-pay | Admitting: Gastroenterology

## 2024-06-07 ENCOUNTER — Ambulatory Visit: Admitting: Gastroenterology

## 2024-06-07 VITALS — BP 110/68 | HR 68 | Ht 60.25 in | Wt 165.0 lb

## 2024-06-07 DIAGNOSIS — K219 Gastro-esophageal reflux disease without esophagitis: Secondary | ICD-10-CM

## 2024-06-07 DIAGNOSIS — K862 Cyst of pancreas: Secondary | ICD-10-CM | POA: Diagnosis not present

## 2024-06-07 DIAGNOSIS — R221 Localized swelling, mass and lump, neck: Secondary | ICD-10-CM

## 2024-06-07 DIAGNOSIS — R1314 Dysphagia, pharyngoesophageal phase: Secondary | ICD-10-CM | POA: Diagnosis not present

## 2024-06-07 MED ORDER — PANTOPRAZOLE SODIUM 40 MG PO TBEC: 40.0000 mg | DELAYED_RELEASE_TABLET | Freq: Every day | ORAL | 3 refills | Status: AC

## 2024-06-07 NOTE — Patient Instructions (Signed)
 Start Pantoprazole  40 mg po daily 1 tablet 30-45 minutes before breakfast  Continue GERD diet Dysphagia precautions given   You have been scheduled for a thyroid  ultrasound at [facility] (1st floor of hospital) on 06/23/24 at 11:00am. Please arrive 30 minutes prior to your appointment for registration. Make certain not to have anything to eat or drink 6 hours prior to your appointment. Should you need to reschedule your appointment, please contact radiology at [phone]. This test typically takes about 30 minutes to perform.  You have been scheduled for a Barium Esophogram at [facility] (1st floor of the hospital) on 06/23/24 at 11:00am. Please arrive 30 minutes prior to your appointment for registration. Make certain not to have anything to eat or drink 3 hours prior to your test. If you need to reschedule for any reason, please contact radiology at [phone] to do so. __________________________________________________________________ A barium swallow is an examination that concentrates on views of the esophagus. This tends to be a double contrast exam (barium and two liquids which, when combined, create a gas to distend the wall of the oesophagus) or single contrast (non-ionic iodine  based). The study is usually tailored to your symptoms so a good history is essential. Attention is paid during the study to the form, structure and configuration of the esophagus, looking for functional disorders (such as aspiration, dysphagia, achalasia, motility and reflux) EXAMINATION You may be asked to change into a gown, depending on the type of swallow being performed. A radiologist and radiographer will perform the procedure. The radiologist will advise you of the type of contrast selected for your procedure and direct you during the exam. You will be asked to stand, sit or lie in several different positions and to hold a small amount of fluid in your mouth before being asked to swallow  while the imaging is performed .In some instances you may be asked to swallow barium coated marshmallows to assess the motility of a solid food bolus. The exam can be recorded as a digital or video fluoroscopy procedure. POST PROCEDURE It will take 1-2 days for the barium to pass through your system. To facilitate this, it is important, unless otherwise directed, to increase your fluids for the next 24-48hrs and to resume your normal diet.  This test typically takes about 30 minutes to perform. __________________________________________________________________________________   _______________________________________________________  If your blood pressure at your visit was 140/90 or greater, please contact your primary care physician to follow up on this.  _______________________________________________________  If you are age 84 or older, your body mass index should be between 23-30. Your Body mass index is 31.96 kg/m. If this is out of the aforementioned range listed, please consider follow up with your Primary Care Provider.  If you are age 45 or younger, your body mass index should be between 19-25. Your Body mass index is 31.96 kg/m. If this is out of the aformentioned range listed, please consider follow up with your Primary Care Provider.   ________________________________________________________  The [facility] providers would like to encourage you to use MYCHART to communicate with providers for non-urgent requests or questions.  Due to long hold times on the telephone, sending your provider a message by [facility] may be a faster and more efficient way to get a response.  Please allow 48 business hours for a response.  Please remember that this is for non-urgent requests.  _______________________________________________________  [facility] is using a team-based approach to care.  Your team is made up  of your doctor and two to three APPS. Our APPS (Nurse Practitioners and  Physician Assistants) work with your physician to ensure care continuity for you. They are fully qualified to address your health concerns and develop a treatment plan. They communicate directly with your gastroenterologist to care for you. Seeing the Advanced Practice Practitioners on your physician's team can help you by facilitating care more promptly, often allowing for earlier appointments, access to diagnostic testing, procedures, and other specialty referrals.   Thank you for trusting me with your gastrointestinal care. Deanna May, NP-C

## 2024-06-07 NOTE — Progress Notes (Addendum)
 Chief Complaint: Dysphagia Primary GI Doctor:(Previously Dr. Aneita) Dr. Suzann  HPI:  Patient is a  83  year old female patient, previously known to Dr. Aneita, with past medical history of atrial fibrillation, hypertension, Moderate mitral valve regurgitation and aortic valve stenosis, GERD, thyroid  cyst, who was self referred to me for a evaluation of dysphagia.  03/06/24 ED visit for palpitations. Patient was diagnosed with asthma exacerbation has outpatient and was treated with bronchodilator therapy, steroids and antibiotics. On the day of admission while sitting she had severe palpitations and chest discomfort. Patient has converted to sinus rhythm after direct current cardioversion. Plan to continue diltiazem  for rate control and for anticoagulation apixaban .   03/16/2024 last seen by cardiology for atrial fibrillation and hypertension.  Reviewed entire note.  Interval History    Patient presents for evaluation of dysphagia. Patient has history of GERD and currently taking omeprazole  40 mg po daily.  Patient has been taking omeprazole  40 mg p.o. daily for quite some time.  Patient presents with pill dysphagia that started about 3 months ago.  She reports she will also have issues with rice getting stuck. No history of neurological issues or recent stoke.  She had dilatation back in 2008 and reports no issues since then. Patient denies nausea, vomiting, or weight loss. No blood in stools.   Patient on apixaban  5mg  twice daily for atrial fibrillation  Patient's family history includes pancreatic CA in father age 65, brother with bile duct CA at age 1.  GI PROCEDURES:  Colonoscopy/EGD  ( 12/18/22) with Dr. Aneita for diarrhea and IDA Colonoscopy - Two 6 to 7 mm polyps in the ascending colon, removed with a cold snare. Resected and retrieved. - A single non- bleeding colonic angioectasia. - Moderate diverticulosis in the left colon. - Internal hemorrhoids. - The examination was otherwise  normal on direct and retroflexion views. Biopsied. Path:  1. Surgical [P], colon, ascending, polyp (2) - TUBULAR ADENOMA (1 OF 2 FRAGMENTS) - NEGATIVE FOR HIGH-GRADE DYSPLASIA - POLYPOID COLONIC MUCOSA WITH FOCAL, SURFACE HYPERPLASTIC CHANGES AND LYMPHOID AGGREGATE (1 OF 2 FRAGMENTS) - NEGATIVE FOR HIGH-GRADE DYSPLASIA OR MALIGNANCY 2. Surgical [P], random sites colon - BENIGN COLONIC MUCOSA WITHOUT DIAGNOSTIC ABNORMALITY - NEGATIVE FOR MICROSCOPIC COLITIDES  EGD - Normal esophagus. - Medium- sized hiatal hernia. - Erythematous mucosa in the gastric body. Biopsied. - Mucosal changes in the duodenal bulb. Biopsied. - Otherwise normal appearing duodenum. Biopsied. Path:  Surgical [P], duodenal - DUODENAL MUCOSA WITH FOCAL PSEUDOMELANOSIS DUODENI - NEGATIVE FOR CELIAC CHANGE 4. Surgical [P], gastric antrum and gastric body - GASTRIC, OXYNTIC AND TRANSITIONAL TYPE MUCOSA WITH REACTIVE (CHEMICAL) GASTROPATHY - OXYNTIC MUCOSA WITH EARLY FUNDIC GLAND POLYP LIKE CHANGES/EARLY, FOCAL PROTON PUMP INHIBITOR TYPE EFFECT - NEGATIVE FOR HELICOBACTER ORGANISMS ON H&E STAIN  Colonoscopy 02/07/2015: 1. Mild diverticulosis was noted in the sigmoid colon 2. 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 mild by carotid  duplex.   Cervical dysplasia 1971   Coronary artery disease    Diverticulosis of colon    DJD (degenerative joint disease)    Family history of cancer of extrahepatic bile ducts 09/05/2020   Family  history of colon cancer 09/05/2020   Family history of pancreatic cancer 09/05/2020   GERD (gastroesophageal reflux disease)    Headache(784.0)    History of kidney stones    Hypercholesterolemia    Hypertension    Interstitial cystitis    sees urologist   Lichen sclerosus    Malignant melanoma (HCC) 2006   sees Dr. Shona in dermatology   Migraines    Mild aortic stenosis    Mitral valve disease    Question mitral valve prolapse in the past, no prolapse by echo 2009   Murmur 10/20/2015   SEES DR NELSON   OSA (obstructive sleep apnea)    USES DENTAL DEVICE   Osteoporosis    on fosomax > 5 years, stopped 11/2015   Pneumonia    SVT (supraventricular tachycardia) (HCC) 02/15/2022   Monitor 02/2022: NSR, avg HR 61; 2 runs of NSVT (4 beats); several runs of Supraventricular Tachycardia (longest 311'); no AFib   Thyroid  cyst    1 x 1.1 thyroid  cyst noted on carotid Doppler, January, 2012   Venous insufficiency     Past Surgical History:  Procedure Laterality Date   BREAST BIOPSY Left 11/25/2011   U/S core, benign performed at [facility], LEFT BREAT MARKER IN   BREAST CYST ASPIRATION     BREAST SURGERY  2013   Breast Bx-Benign   CARDIAC CATHETERIZATION     CARDIOVERSION N/A 03/05/2024   Procedure: CARDIOVERSION;  Surgeon: Loni Soyla LABOR, MD;  Location: [facility] INVASIVE CV LAB;  Service: Cardiovascular;  Laterality: N/A;   CATARACT EXTRACTION, BILATERAL     CHOLECYSTECTOMY N/A 12/22/2019   Procedure: LAPAROSCOPIC CHOLECYSTECTOMY;  Surgeon: Teresa Lonni HERO, MD;  Location: [facility] [place];  Service: General;  Laterality: N/A;   cystoscopy and basket stone removal right ureter  02/2006   Dr. Andra   GALLBLADDER SURGERY  12/22/2019   GYNECOLOGIC CRYOSURGERY  1971   left total hip replacement  2004   Dr. Heide   melanoma removed from medial rleft knee area  2006   Dr. Shona   NASAL SEPTUM SURGERY     TOTAL HIP ARTHROPLASTY Right 08/01/2021   Procedure: TOTAL HIP  ARTHROPLASTY ANTERIOR APPROACH;  Surgeon: Melodi Lerner, MD;  Location: [facility] ORS;  Service: Orthopedics;  Laterality: Right;   TRANSESOPHAGEAL ECHOCARDIOGRAM (CATH LAB) N/A 03/05/2024   Procedure: TRANSESOPHAGEAL ECHOCARDIOGRAM;  Surgeon: Loni Soyla LABOR, MD;  Location: [facility] INVASIVE CV LAB;  Service: Cardiovascular;  Laterality: N/A;    Current Outpatient Medications  Medication Sig Dispense Refill   acyclovir  (ZOVIRAX ) 400 MG tablet Take 400 mg by mouth as needed.     ADVAIR HFA 115-21 MCG/ACT inhaler Inhale 2 puffs into the lungs 2 (two) times daily.     albuterol  (PROAIR  HFA) 108 (90 Base) MCG/ACT inhaler Inhale 2 puffs into the lungs every 6 (six) hours as needed for wheezing. 3 Inhaler 3   albuterol  (PROVENTIL ) (2.5 MG/3ML) 0.083% nebulizer solution Take 2.5 mg by nebulization. Every 4-6 hours     alendronate  (FOSAMAX ) 70 MG tablet TAKE 1 TABLET EVERY 7 (SEVEN) DAYS. TAKE WITH A FULL GLASS OF WATER  ON AN EMPTY STOMACH. 12 tablet 3   apixaban  (ELIQUIS ) 5 MG TABS tablet Take 1 tablet (5 mg total) by mouth 2 (  two) times daily. 180 tablet 1   Ascorbic Acid (VITAMIN C) 1000 MG tablet Take 1,000 mg by mouth daily.     azelastine  (ASTELIN ) 0.1 % nasal spray Instill 2 sprays into each nostril two times a day as needed for allergies 30 mL 0   Bempedoic Acid -Ezetimibe  (NEXLIZET ) 180-10 MG TABS Take 1 tablet by mouth daily. 90 tablet 2   betamethasone  valerate ointment (VALISONE ) 0.1 % Apply 1 Application topically as needed.     CALCIUM  PO Take 1,200 mg by mouth daily.     Cholecalciferol 125 MCG (5000 UT) TABS Take 1 tablet by mouth daily.     clobetasol  (TEMOVATE ) 0.05 % external solution APPLY TO SCALP TWICE A DAY *AVOID FACE, GROIN, AND UNDERARMS*     desmopressin  (DDAVP ) 0.2 MG tablet Take 0.2 mg by mouth at bedtime.     diltiazem  (CARDIZEM  CD) 180 MG 24 hr capsule Take 1 capsule (180 mg total) by mouth daily. 90 capsule 1   EPINEPHrine  0.3 mg/0.3 mL IJ SOAJ injection Inject 0.3 mg into the  muscle as needed for anaphylaxis.     fexofenadine (ALLEGRA) 180 MG tablet Take 180 mg by mouth daily.     Fluocinolone Acetonide Scalp 0.01 % OIL Apply 1 Application topically once a week.     furosemide  (LASIX ) 20 MG tablet Take 1 tablet (20 mg total) by mouth daily as needed for edema. 90 tablet 1   hydrALAZINE  (APRESOLINE ) 50 MG tablet Take 50 mg by mouth as needed.     hydrOXYzine  (ATARAX /VISTARIL ) 10 MG tablet Take 10 mg by mouth at bedtime.      ipratropium-albuterol  (DUONEB) 0.5-2.5 (3) MG/3ML SOLN Take 3 mLs by nebulization every 6 (six) hours as needed. 120 mL 0   ketoconazole (NIZORAL) 2 % cream Apply 1 Application topically 2 (two) times daily.     losartan  (COZAAR ) 100 MG tablet TAKE 1 TABLET BY MOUTH EVERY DAY 90 tablet 0   MAGNESIUM  PO Take 250 mg by mouth daily.     montelukast  (SINGULAIR ) 10 MG tablet Take 10 mg by mouth at bedtime.     Multiple Vitamin (MULTIVITAMIN PO) Take 1 tablet by mouth daily.     omeprazole  (PRILOSEC) 40 MG capsule TAKE 1 CAPSULE (40 MG TOTAL) BY MOUTH DAILY. 90 capsule 0   OVER THE COUNTER MEDICATION Juice Plus-daily     OVER THE COUNTER MEDICATION Nopolea 3 oz once a day     pantoprazole  (PROTONIX ) 40 MG tablet Take 1 tablet (40 mg total) by mouth daily. 90 tablet 3   prazosin  (MINIPRESS ) 5 MG capsule Take 1 capsule (5 mg total) by mouth 2 (two) times daily. 180 capsule 1   Probiotic Product (PROBIOTIC PO) Take 1 capsule by mouth daily.      Psyllium (METAMUCIL) 0.36 g CAPS Take 1 capsule by mouth daily.     vitamin B-12 (CYANOCOBALAMIN ) 100 MCG tablet Take 100 mcg by mouth daily.     No current facility-administered medications for this visit.    Allergies as of 06/07/2024 - Review Complete 06/07/2024  Allergen Reaction Noted   Celebrex [celecoxib] Diarrhea 03/14/2021   Cephalexin  06/17/2023   Hydrochlorothiazide   11/12/2019   Penicillin g benzathine  06/17/2023   Penicillins Hives and Swelling 02/27/2024   Phenobarbital Hives 06/17/2023    Statins  07/13/2021   Passion fruit flavoring agent (non-screening) Diarrhea and Rash 07/13/2021   Tape Rash 01/10/2015    Family History  Problem Relation Age of Onset  Pancreatic cancer Father 10   Diabetes Mother    Heart disease Mother    Cancer Brother 76       Bile duct   Diabetes Brother    Hypertension Brother    Diabetes Brother    Heart disease Brother    Hypertension Brother    Hypertension Sister    Hypertension Sister    Colon cancer Paternal Uncle        dx 76s   Lung cancer Paternal Uncle        dx 87s    Review of Systems:    Constitutional: No weight loss, fever, chills, weakness or fatigue HEENT: Eyes: No change in vision               Ears, Nose, Throat:  No change in hearing or congestion Skin: No rash or itching Cardiovascular: No chest pain, chest pressure or palpitations   Respiratory: No SOB or cough Gastrointestinal: See HPI and otherwise negative Genitourinary: No dysuria or change in urinary frequency Neurological: No headache, dizziness or syncope Musculoskeletal: No new muscle or joint pain Hematologic: No bleeding or bruising Psychiatric: No history of depression or anxiety    Physical Exam:  Vital signs: BP 110/68 (BP Location: Left Arm, Patient Position: Sitting, Cuff Size: Normal)   Pulse 68   Ht 5' 0.25 (1.53 m) Comment: height measured without shoes  Wt 165 lb (74.8 kg)   BMI 31.96 kg/m   Constitutional:   Pleasant female appears to be in NAD, Well developed, Well nourished, alert and cooperative Eyes:   PEERL, EOMI. No icterus. Conjunctiva pink. Neck: palpated nodule on right side of neck Throat: Oral cavity and pharynx without inflammation, swelling or lesion.  Respiratory: Respirations even and unlabored. Lungs clear to auscultation bilaterally.   No wheezes, crackles, or rhonchi.  Cardiovascular: Normal S1, S2. Regular rate and rhythm. No peripheral edema, cyanosis or pallor.  Gastrointestinal:  Soft, nondistended,  nontender. No rebound or guarding. Normal bowel sounds. No appreciable masses or hepatomegaly. Rectal:  Not performed.  Msk:  Symmetrical without gross deformities. Without edema, no deformity or joint abnormality.  Neurologic:  Alert and  oriented x4;  grossly normal neurologically.  Skin:   Dry and intact without significant lesions or rashes.  RELEVANT LABS AND IMAGING: CBC    Latest Ref Rng & Units 03/04/2024    2:36 AM 03/03/2024    3:35 AM 02/27/2024    9:03 PM  CBC  WBC 4.0 - 10.5 K/uL 10.0  6.9  8.0   Hemoglobin 12.0 - 15.0 g/dL 87.6  88.8  89.8   Hematocrit 36.0 - 46.0 % 36.6  34.3  31.1   Platelets 150 - 400 K/uL 232  197  158      CMP     Latest Ref Rng & Units 03/06/2024   10:55 AM 03/05/2024    2:59 AM 03/04/2024    2:36 AM  CMP  Glucose 70 - 99 mg/dL 899  879  890   BUN 8 - 23 mg/dL 17  19  11    Creatinine 0.44 - 1.00 mg/dL 9.20  9.07  9.28   Sodium 135 - 145 mmol/L 138  138  136   Potassium 3.5 - 5.1 mmol/L 4.2  4.3  4.2   Chloride 98 - 111 mmol/L 104  106  102   CO2 22 - 32 mmol/L 26  25  24    Calcium  8.9 - 10.3 mg/dL 9.1  8.8  9.0  Lab Results  Component Value Date   TSH 1.047 03/03/2024  03/05/2024 echo TEE-Left ventricular ejection fraction, by estimation, is 55 to 60%.  04/27/24 MCRP Abdomen  IMPRESSION: 1.Multiple T2 hyperintense cystic lesions throughout the pancreatic parenchyma some demonstrating communication with the main duct suggestive of side branch IPMNs. No enhancing solid component or interval growth identified. Recommend imaging follow-up in 6 months with MRCP to ensure stability.   2.Multiple bilateral simple renal cortical cysts which does not require imaging follow-up (Bosniak 1,2)   3.  Punctate splenic cyst.   Assessment: Encounter Diagnoses  Name Primary?   Pharyngoesophageal dysphagia Yes   Gastroesophageal reflux disease, unspecified whether esophagitis present    Neck nodule    Pancreatic cyst        83 year old female  patient that presents with pill dysphagia with history of GERD requiring dilatation s/p esophageal stricture in 2008. Patient had EGD in February 2024 for IDA that showed medium size hiatal hernia but no narrowing of esophagus.  Anemia, resolved.  We discussed ordering barium esophagram initially to evaluate if this is truly a stricture or if it is related to motility disorder.  Patient has recent cardiac history and would have to go off of Eliquis  for procedure.  Patient agrees to plan and understands if there is a narrowing she would need to have EGD with dilatation if cleared by cardiology.  Will go ahead and switch patient from omeprazole  to pantoprazole  to see if this helps with current symptoms.  Also palpated cyst on right side of neck will also order ultrasound to evaluate.    Patient has history of pancreatic cyst suggestive of sidebranch IPMN's.  Most recent imaging showed communication with the main duct therefore recommendations were made to proceed with imaging in 6 months to ensure stable ability due to family history.    Patient up-to-date with colon screening colonoscopy with no repeat due to age.  Plan: -  Order Barium esophagram  - Order US  neck nodule ( right side) - switch omeprazole  to pantoprazole  40 mg po daily  - Reinforce GERD diet, no late meals 3-4 hours before lying down   Thank you for the courtesy of this consult. Please call me with any questions or concerns.   Emelyn Roen, FNP-C [facility] 06/07/2024, 5:08 PM  Cc: Ozell Heron HERO, MD  I have reviewed the clinic note as outlined by Cathryne Beal, NP and agree with the assessment, plan and medical decision making.  Ms. Watson presents with symptoms of GERD and dysphagia predominantly to solids.  Had a history of esophageal stricture in 2008 requiring dilation.  Given cardiac history I agree with performing a barium esophagram and neck ultrasound.  Agree with optimization of PPI and GERD lifestyle  modification.  IPMN can be followed with 56-month follow-up MRI/MRCP  Inocente Hausen, MD

## 2024-06-08 ENCOUNTER — Other Ambulatory Visit: Payer: Self-pay | Admitting: Family Medicine

## 2024-06-10 DIAGNOSIS — J3081 Allergic rhinitis due to animal (cat) (dog) hair and dander: Secondary | ICD-10-CM | POA: Diagnosis not present

## 2024-06-10 DIAGNOSIS — J3089 Other allergic rhinitis: Secondary | ICD-10-CM | POA: Diagnosis not present

## 2024-06-10 DIAGNOSIS — J301 Allergic rhinitis due to pollen: Secondary | ICD-10-CM | POA: Diagnosis not present

## 2024-06-11 DIAGNOSIS — M170 Bilateral primary osteoarthritis of knee: Secondary | ICD-10-CM | POA: Diagnosis not present

## 2024-06-15 ENCOUNTER — Telehealth: Payer: Self-pay | Admitting: *Deleted

## 2024-06-15 NOTE — Telephone Encounter (Signed)
 I have had time to review the supplements. As far as I know I do not see any interference with these supplements with her current medications. I would separate the timing of taking these supplements. For example if she take her prescription medications in the morning then she should take the supplements at night so that there is no interference with absorption. As always take with caution.

## 2024-06-15 NOTE — Telephone Encounter (Signed)
 Copied from CRM [id number]. Topic: Clinical - Medication Question >> Jun 15, 2024  9:48 AM Corin V wrote: Reason for CRM: Patient is calling again to follow up on if she can safely take the supplements for:   -Joint Motion Flex - Turmeric/Coleus Phyllistine Extract/Black Pepper Extract -VisUltra- Vit A/Vit C/Vitamin D3/Zinc/Copper -Acufeno- Vit C/Niacin/Vit B-6/Folate/Vit B-12/Hawthorne Extract/Garlic Powder/Olivive Extract/Hibiscus Powder/Buchu Concentrate/Juniper Powder/Green Tea 50% Extract  Please call back: [phone]

## 2024-06-15 NOTE — Telephone Encounter (Signed)
 Left a detailed message with the information below at the patient's cell number.

## 2024-06-16 DIAGNOSIS — J3081 Allergic rhinitis due to animal (cat) (dog) hair and dander: Secondary | ICD-10-CM | POA: Diagnosis not present

## 2024-06-16 DIAGNOSIS — J301 Allergic rhinitis due to pollen: Secondary | ICD-10-CM | POA: Diagnosis not present

## 2024-06-16 DIAGNOSIS — J3089 Other allergic rhinitis: Secondary | ICD-10-CM | POA: Diagnosis not present

## 2024-06-21 ENCOUNTER — Encounter: Payer: Self-pay | Admitting: *Deleted

## 2024-06-22 ENCOUNTER — Ambulatory Visit: Admitting: Nurse Practitioner

## 2024-06-22 ENCOUNTER — Encounter: Payer: Self-pay | Admitting: Family Medicine

## 2024-06-22 ENCOUNTER — Ambulatory Visit (INDEPENDENT_AMBULATORY_CARE_PROVIDER_SITE_OTHER): Admitting: Family Medicine

## 2024-06-22 VITALS — BP 138/80 | HR 55 | Temp 98.1°F | Ht 60.25 in | Wt 163.7 lb

## 2024-06-22 DIAGNOSIS — J209 Acute bronchitis, unspecified: Secondary | ICD-10-CM

## 2024-06-22 MED ORDER — AZITHROMYCIN 250 MG PO TABS: ORAL_TABLET | ORAL | 0 refills | Status: DC

## 2024-06-22 NOTE — Progress Notes (Signed)
 Established Patient Office Visit  Subjective   Patient ID: Brianna Mccarty, female    DOB: 05-03-1941  Age: 83 y.o. MRN: [id number]  Chief Complaint  Patient presents with   Cough    Productive with yellow sputum x1 day   Shortness of Breath    X4 days, began using home nebulizer treatments due to asthma with some relief, states oxygen  was down to 92-93%-after treatments 94%    Pt states 4 days of SOB and coughing with yellow sputum production, no sick contacts, no fever or chills. States that she usually gets sick this time of year due to allergies. Has been using her nebulizer at home and thinks that she is feeling a little better. No sore throat or ear pain. Pt is reporting some pain in her chest,     Current Outpatient Medications  Medication Instructions   acyclovir  (ZOVIRAX ) 400 mg, As needed   ADVAIR HFA 115-21 MCG/ACT inhaler 2 puffs, 2 times daily   albuterol  (PROAIR  HFA) 108 (90 Base) MCG/ACT inhaler 2 puffs, Inhalation, Every 6 hours PRN   albuterol  (PROVENTIL ) 2.5 mg   alendronate  (FOSAMAX ) 70 MG tablet TAKE 1 TABLET EVERY 7 (SEVEN) DAYS. TAKE WITH A FULL GLASS OF WATER  ON AN EMPTY STOMACH.   apixaban  (ELIQUIS ) 5 mg, Oral, 2 times daily   azelastine  (ASTELIN ) 0.1 % nasal spray Instill 2 sprays into each nostril two times a day as needed for allergies   azithromycin  (ZITHROMAX  Z-PAK) 250 MG tablet Take 2 tablets on day 1, then 1 tablet daily for 4 days.   Bempedoic Acid -Ezetimibe  (NEXLIZET ) 180-10 MG TABS 1 tablet, Oral, Daily   betamethasone  valerate ointment (VALISONE ) 0.1 % 1 Application, As needed   CALCIUM  PO 1,200 mg, Daily   Cholecalciferol 125 MCG (5000 UT) TABS 1 tablet, Daily   clobetasol  (TEMOVATE ) 0.05 % external solution APPLY TO SCALP TWICE A DAY *AVOID FACE, GROIN, AND UNDERARMS*   desmopressin  (DDAVP ) 0.2 mg, Daily at bedtime   diltiazem  (CARDIZEM  CD) 180 mg, Oral, Daily   EPINEPHrine  (EPI-PEN) 0.3 mg, As needed   fexofenadine (ALLEGRA) 180 mg, Daily    Fluocinolone Acetonide Scalp 0.01 % OIL 1 Application, Weekly   furosemide  (LASIX ) 20 mg, Oral, Daily PRN   hydrALAZINE  (APRESOLINE ) 50 mg, As needed   hydrOXYzine  (ATARAX ) 10 mg, Daily at bedtime   ipratropium-albuterol  (DUONEB) 0.5-2.5 (3) MG/3ML SOLN 3 mLs, Nebulization, Every 6 hours PRN   ketoconazole (NIZORAL) 2 % cream 1 Application, 2 times daily   losartan  (COZAAR ) 100 mg, Oral, Daily   MAGNESIUM  PO 250 mg, Daily   montelukast  (SINGULAIR ) 10 mg, Daily at bedtime   Multiple Vitamin (MULTIVITAMIN PO) 1 tablet, Daily   omeprazole  (PRILOSEC) 40 mg, Oral, Daily   OVER THE COUNTER MEDICATION Juice Plus-daily   OVER THE COUNTER MEDICATION Nopolea 3 oz once a day   pantoprazole  (PROTONIX ) 40 mg, Oral, Daily   prazosin  (MINIPRESS ) 5 mg, Oral, 2 times daily   Probiotic Product (PROBIOTIC PO) 1 capsule, Daily   Psyllium (METAMUCIL) 0.36 g CAPS 1 capsule, Daily   vitamin B-12 (CYANOCOBALAMIN ) 100 mcg, Daily   vitamin C 1,000 mg, Daily    Patient Active Problem List   Diagnosis Date Noted   Atrial fibrillation (HCC) 03/05/2024   Acute on chronic diastolic CHF (congestive heart failure) (HCC) 03/04/2024   Hypomagnesemia 03/04/2024   Atrial flutter (HCC) 03/03/2024   Abnormal chest x-ray 03/03/2024   Impacted cerumen of both ears 08/05/2023   Acute diverticulitis  07/11/2022   SVT (supraventricular tachycardia) (HCC) 02/15/2022   Palpitations 01/15/2022   Aortic stenosis 01/15/2022   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   Genetic testing 09/13/2020   Family history of pancreatic cancer 09/05/2020   Family history of colon cancer 09/05/2020   Family history of cancer of extrahepatic bile ducts 09/05/2020   Fever 07/26/2020   History of COVID-19 07/26/2020   History of melanoma 05/12/2020   Macular degeneration 02/17/2019   OSA (obstructive sleep apnea) 02/12/2018   Prediabetes 02/12/2018    H/O cold sores 11/27/2015   Murmur 10/20/2015   Carotid artery disease (HCC)    Thyroid  cyst    Venous (peripheral) insufficiency 06/02/2009   Asthma 04/27/2008   Melanoma of skin (HCC) 02/10/2008   Allergic rhinitis 02/10/2008   INTERSTITIAL CYSTITIS 02/10/2008   Pure hypercholesterolemia 02/09/2008   Essential hypertension 02/09/2008   GERD 02/09/2008   Osteoporosis 02/09/2008      Review of Systems  All other systems reviewed and are negative.     Objective:     BP 138/80   Pulse (!) 55   Temp 98.1 F (36.7 C) (Oral)   Ht 5' 0.25 (1.53 m)   Wt 163 lb 11.2 oz (74.3 kg)   SpO2 98%   BMI 31.71 kg/m    Physical Exam Vitals reviewed.  Constitutional:      Appearance: She is well-developed. She is obese.  HENT:     Mouth/Throat:     Mouth: Mucous membranes are moist.     Pharynx: No oropharyngeal exudate.  Eyes:     Pupils: Pupils are equal, round, and reactive to light.  Cardiovascular:     Rate and Rhythm: Normal rate and regular rhythm.     Heart sounds: Murmur heard.  Pulmonary:     Effort: Pulmonary effort is normal.     Breath sounds: Normal breath sounds.  Chest:     Chest wall: No tenderness.  Lymphadenopathy:     Cervical: No cervical adenopathy.  Neurological:     Mental Status: She is alert and oriented to person, place, and time.      No results found for any visits on 06/22/24.    The ASCVD Risk score (Arnett DK, et al., 2019) failed to calculate for the following reasons:   The 2019 ASCVD risk score is only valid for ages 45 to 66    Assessment & Plan:  Acute bronchitis, unspecified organism -     Azithromycin ; Take 2 tablets on day 1, then 1 tablet daily for 4 days.  Dispense: 6 each; Refill: 0   Patient does not wish to start steroids due to her history of tachycardia from steroids, will treat with z-pak and she was encouraged to continue her nebs at home. Pt was advised to RTC if sx worsen or persist.  No follow-ups on file.     Heron CHRISTELLA Sharper, MD

## 2024-06-23 ENCOUNTER — Ambulatory Visit (HOSPITAL_COMMUNITY)
Admission: RE | Admit: 2024-06-23 | Discharge: 2024-06-23 | Disposition: A | Discharge status: Home / Self Care | Source: Ambulatory Visit | Attending: Gastroenterology | Admitting: Gastroenterology

## 2024-06-23 ENCOUNTER — Other Ambulatory Visit: Payer: Self-pay | Admitting: Gastroenterology

## 2024-06-23 DIAGNOSIS — R1314 Dysphagia, pharyngoesophageal phase: Secondary | ICD-10-CM

## 2024-06-23 DIAGNOSIS — K219 Gastro-esophageal reflux disease without esophagitis: Secondary | ICD-10-CM | POA: Insufficient documentation

## 2024-06-23 DIAGNOSIS — K224 Dyskinesia of esophagus: Secondary | ICD-10-CM | POA: Diagnosis not present

## 2024-06-23 DIAGNOSIS — R221 Localized swelling, mass and lump, neck: Secondary | ICD-10-CM

## 2024-06-23 DIAGNOSIS — K449 Diaphragmatic hernia without obstruction or gangrene: Secondary | ICD-10-CM | POA: Diagnosis not present

## 2024-06-23 DIAGNOSIS — R1310 Dysphagia, unspecified: Secondary | ICD-10-CM | POA: Diagnosis not present

## 2024-06-23 DIAGNOSIS — E041 Nontoxic single thyroid nodule: Secondary | ICD-10-CM | POA: Diagnosis not present

## 2024-06-23 DIAGNOSIS — K862 Cyst of pancreas: Secondary | ICD-10-CM

## 2024-06-24 ENCOUNTER — Ambulatory Visit: Attending: Internal Medicine | Admitting: Internal Medicine

## 2024-06-24 ENCOUNTER — Encounter: Payer: Self-pay | Admitting: Internal Medicine

## 2024-06-24 VITALS — BP 120/70 | HR 62 | Ht 62.0 in | Wt 165.0 lb

## 2024-06-24 DIAGNOSIS — I340 Nonrheumatic mitral (valve) insufficiency: Secondary | ICD-10-CM | POA: Diagnosis not present

## 2024-06-24 DIAGNOSIS — I480 Paroxysmal atrial fibrillation: Secondary | ICD-10-CM

## 2024-06-24 DIAGNOSIS — I350 Nonrheumatic aortic (valve) stenosis: Secondary | ICD-10-CM

## 2024-06-24 DIAGNOSIS — J309 Allergic rhinitis, unspecified: Secondary | ICD-10-CM | POA: Diagnosis not present

## 2024-06-24 DIAGNOSIS — D6869 Other thrombophilia: Secondary | ICD-10-CM

## 2024-06-24 DIAGNOSIS — J4530 Mild persistent asthma, uncomplicated: Secondary | ICD-10-CM

## 2024-06-24 NOTE — Progress Notes (Signed)
 Cardiology Office Note:  .   Date:  06/24/2024  ID:  Brianna Mccarty, DOB [birth year], MRN [id number] PCP: Ozell Heron HERO, MD  [facility] Providers Cardiologist:  Soyla DELENA Merck, MD    History of Present Illness: .   Brianna Mccarty is a 83 y.o. female.  Discussed the use of AI scribe software for clinical note transcription with the patient, who gave verbal consent to proceed.  History of Present Illness Brianna Mccarty is an 83 year old female with asthma and atrial fibrillation who presents with bronchitis and elevated blood pressure.  She recently experienced an episode of bronchitis. She was prescribed a Z-Pak instead of her usual prednisone . The antibiotics seem to be effective, although she had a severe coughing spell this morning.  She experienced a recent single episode of elevated blood pressure, with systolic readings in the 140s and 150s, while diastolic pressure remained normal. Her heart rate increased to 108 bpm one night but normalized by the next morning. No recent changes in caffeine or medication intake were noted. Her blood pressure has since stabilized, and she has not needed to use hydralazine  which we have .  She has a history of atrial fibrillation and is currently on diltiazem , which maintains her heart rate between 47 to 54 bpm. She has only consumed two glasses of wine this year. She plans to have a glass of champagne at her grandson's wedding.  She reports difficulty exercising on the treadmill due to knee pain, not because of her heart or breathing issues. She is trying to gradually increase her activity level as she recovers from bronchitis.  She has experienced slight epistaxis over the past three weeks.  She inquires about the safety of taking collagen supplements for joint health and skin benefits.    ROS: negative except per HPI above.  Studies Reviewed: .        Results DIAGNOSTIC Echocardiogram: Mitral valve regurgitation  moderate and stable, aortic valve stenosis moderate (03/2024) Auscultation: Aortic stenosis murmur 2/6 systolic at left upper sternal border, holosystolic murmur medium quality (06/24/2024) Risk Assessment/Calculations:    CHA2DS2-VASc Score = 4   This indicates a 4.8% annual risk of stroke. The patient's score is based upon: CHF History: 0 HTN History: 1 Diabetes History: 0 Stroke History: 0 Vascular Disease History: 0 Age Score: 2 Gender Score: 1      Physical Exam:   VS:  BP 120/70   Pulse 62   Ht 5' 2 (1.575 m)   Wt 165 lb (74.8 kg)   SpO2 96%   BMI 30.18 kg/m    Wt Readings from Last 3 Encounters:  06/27/24 165 lb (74.8 kg)  06/24/24 165 lb (74.8 kg)  06/22/24 163 lb 11.2 oz (74.3 kg)     Physical Exam GENERAL: Alert, cooperative, well developed, no acute distress. HEENT: Normocephalic, normal oropharynx, moist mucous membranes. CHEST: Clear to auscultation bilaterally, no wheezes, rhonchi, or crackles. CARDIOVASCULAR: 2/6 systolic murmur at left upper sternal border, holosystolic murmur, normal heart rate and rhythm, S1 and S2 normal. ABDOMEN: Soft, non-tender, non-distended, without organomegaly, normal bowel sounds. EXTREMITIES: No cyanosis or edema. NEUROLOGICAL: Cranial nerves grossly intact, moves all extremities without gross motor or sensory deficit.   ASSESSMENT AND PLAN: .    Assessment and Plan Assessment & Plan Asthma with acute bronchitis Asthma exacerbation with acute bronchitis. Reports improvement with Z-Pak.. No fever reported. Coughing spells noted but no wheezing or adventitious lung sounds on auscultation. - Continue  Z-Pak as prescribed - Encourage rest and hydration - Avoid strenuous activity until recovery  Atrial fibrillation Secondary hypercoagulable state She has been in SR. Recent episode of elevated heart rate and blood pressure resolved without intervention. Discussed potential triggers including alcohol and dehydration. Advised  on safe alcohol consumption practices to avoid triggering AFib, emphasizing avoiding alcohol close to Eliquis  dosing and ensuring adequate hydration, especially during travel and events. - Avoid alcohol close to Eliquis  dosing. Eliquis  5 mg BID continue.  - Ensure adequate hydration, especially during travel and events  Mitral valve regurgitation, moderate, stable Mitral valve regurgitation remains moderate and stable. No significant changes on auscultation. - Schedule echocardiogram in April 2026  Aortic valve stenosis, moderate, stable Aortic valve stenosis remains moderate and stable. No significant changes on auscultation. - Schedule echocardiogram in April 2026  Hypertension Hypertension is well-controlled with current management. Recent episodic elevation in blood pressure resolved without intervention. Hydralazine  is available as needed but has not been required. Discussed maintaining moderate blood pressure to avoid triggering AFib. - Continue losartan  100 mg daily and prazosin  5 mg twice daily. - Use hydralazine  50 mg if BP exceeds 160/100 mmHg. - Regularly monitor blood pressure and report significant changes.  Allergic rhinitis Allergic rhinitis is contributing to asthma exacerbation.  Epistaxis Intermittent epistaxis likely due to dry air from air conditioning. No significant bleeding reported. - Use saline nasal spray to maintain moisture - Avoid forceful nose blowing  Knee pain Knee pain is limiting exercise ability.  Follow-Up Follow-up plan discussed to align with echocardiogram scheduling. - Schedule follow-up appointment in April 2026 - Coordinate echocardiogram timing with follow-up      Soyla Merck, MD, FACC

## 2024-06-24 NOTE — Patient Instructions (Addendum)
 Medication Instructions:  No Changes *If you need a refill on your cardiac medications before your next appointment, please call your pharmacy*  Lab Work: None   Testing/Procedures: Your physician has requested that you have an echocardiogram, to be done in April 2026, prior to your follow up visit. Echocardiography is a painless test that uses sound waves to create images of your heart. It provides your doctor with information about the size and shape of your heart and how well your heart's chambers and valves are working. This procedure takes approximately one hour. There are no restrictions for this procedure. Please do NOT wear cologne, perfume, aftershave, or lotions (deodorant is allowed). Please arrive 15 minutes prior to your appointment time.  Please note: We ask at that you not bring children with you during ultrasound (echo/ vascular) testing. Due to room size and safety concerns, children are not allowed in the ultrasound rooms during exams. Our front office staff cannot provide observation of children in our lobby area while testing is being conducted. An adult accompanying a patient to their appointment will only be allowed in the ultrasound room at the discretion of the ultrasound technician under special circumstances. We apologize for any inconvenience.   Follow-Up: At [facility], you and your health needs are our priority.  As part of our continuing mission to provide you with exceptional heart care, our providers are all part of one team.  This team includes your primary Cardiologist (physician) and Advanced Practice Providers or APPs (Physician Assistants and Nurse Practitioners) who all work together to provide you with the care you need, when you need it.  Your next appointment:   In about 8 month(s) (April 2026, AFTER your Echocardiogram) We will mail a reminder letter, please call around February 2026 for an appointment.  Provider:   Gayatri A Acharya, MD    Other Instructions Please call us  or send a MyChart message with any Cardiology related questions/concerns.  [phone].  Thank you!

## 2024-06-26 ENCOUNTER — Telehealth: Payer: Self-pay | Admitting: Internal Medicine

## 2024-06-26 NOTE — Telephone Encounter (Signed)
 Brianna Mccarty contacted our after hours line to report that her heart rate went up to 122 bpm when she checked her blood pressure.  She checked her heart rate after taking albuterol  breathing treatment.  I informed [name] that the heart rate can go up after taking a breathing treatment in the setting of atrial fibrillation.  She should only be concerned if it lasts more than 1-hour.  Her heart rate now is back to normal.

## 2024-06-27 ENCOUNTER — Emergency Department (HOSPITAL_COMMUNITY)

## 2024-06-27 ENCOUNTER — Other Ambulatory Visit: Payer: Self-pay

## 2024-06-27 ENCOUNTER — Emergency Department (HOSPITAL_COMMUNITY): Admission: EM | Admit: 2024-06-27 | Discharge: 2024-06-27 | Disposition: A | Discharge status: Home / Self Care

## 2024-06-27 ENCOUNTER — Encounter (HOSPITAL_COMMUNITY): Payer: Self-pay

## 2024-06-27 DIAGNOSIS — R000 Tachycardia, unspecified: Secondary | ICD-10-CM | POA: Diagnosis not present

## 2024-06-27 DIAGNOSIS — D649 Anemia, unspecified: Secondary | ICD-10-CM | POA: Diagnosis not present

## 2024-06-27 DIAGNOSIS — I517 Cardiomegaly: Secondary | ICD-10-CM | POA: Diagnosis not present

## 2024-06-27 DIAGNOSIS — I4891 Unspecified atrial fibrillation: Secondary | ICD-10-CM

## 2024-06-27 DIAGNOSIS — R002 Palpitations: Secondary | ICD-10-CM | POA: Insufficient documentation

## 2024-06-27 DIAGNOSIS — R911 Solitary pulmonary nodule: Secondary | ICD-10-CM | POA: Diagnosis not present

## 2024-06-27 DIAGNOSIS — R918 Other nonspecific abnormal finding of lung field: Secondary | ICD-10-CM | POA: Diagnosis not present

## 2024-06-27 DIAGNOSIS — D72819 Decreased white blood cell count, unspecified: Secondary | ICD-10-CM | POA: Insufficient documentation

## 2024-06-27 DIAGNOSIS — Z7901 Long term (current) use of anticoagulants: Secondary | ICD-10-CM | POA: Diagnosis not present

## 2024-06-27 LAB — CBC
HCT: 32.3 % — ABNORMAL LOW (ref 36.0–46.0)
Hemoglobin: 11.1 g/dL — ABNORMAL LOW (ref 12.0–15.0)
MCH: 30 pg (ref 26.0–34.0)
MCHC: 34.4 g/dL (ref 30.0–36.0)
MCV: 87.3 fL (ref 80.0–100.0)
Platelets: 202 K/uL (ref 150–400)
RBC: 3.7 MIL/uL — ABNORMAL LOW (ref 3.87–5.11)
RDW: 12.8 % (ref 11.5–15.5)
WBC: 3.8 K/uL — ABNORMAL LOW (ref 4.0–10.5)
nRBC: 0 % (ref 0.0–0.2)

## 2024-06-27 LAB — BASIC METABOLIC PANEL WITH GFR
Anion gap: 8 (ref 5–15)
BUN: 18 mg/dL (ref 8–23)
CO2: 21 mmol/L — ABNORMAL LOW (ref 22–32)
Calcium: 9.1 mg/dL (ref 8.9–10.3)
Chloride: 107 mmol/L (ref 98–111)
Creatinine, Ser: 0.68 mg/dL (ref 0.44–1.00)
GFR, Estimated: >60 mL/min (ref >60)
Glucose, Bld: 93 mg/dL (ref 70–99)
Potassium: 3.8 mmol/L (ref 3.5–5.1)
Sodium: 136 mmol/L (ref 135–145)

## 2024-06-27 LAB — MAGNESIUM: Magnesium: 1.7 mg/dL (ref 1.7–2.4)

## 2024-06-27 MED ORDER — BENZONATATE 100 MG PO CAPS: 100.0000 mg | ORAL_CAPSULE | Freq: Three times a day (TID) | ORAL | 0 refills | Status: AC

## 2024-06-27 NOTE — Discharge Instructions (Signed)
 Please follow-up with your cardiologist.  Return if no fevers, chills lightheadedness, chest pain, shortness of breath abdominal pain inability eat or drink due to nausea vomiting or any new or worsening symptoms that are concerning to you.

## 2024-06-27 NOTE — ED Provider Notes (Signed)
 [place] EMERGENCY DEPARTMENT AT [facility] Provider Note   CSN: [id number] Arrival date & time: 06/27/24  0740     Patient presents with: Palpitations and Tachycardia   Brianna Mccarty is a 83 y.o. female.   83 year old presenting emergency department for palpitations.  Reports history of A-fib on diltiazem  and Eliquis  had some bronchitis last week, has noted palpitations yesterday and today intermittently.  Heart rate will jump up to the 120s and then normalize.  No lightheadedness dizziness or chest pain.  Feels like her typical A-fib fluttering sensation.   Palpitations      Prior to Admission medications   Medication Sig Start Date End Date Taking? Authorizing Provider  acyclovir  (ZOVIRAX ) 400 MG tablet Take 400 mg by mouth as needed.    [provider]  ADVAIR [facility] 115-21 MCG/ACT inhaler Inhale 2 puffs into the lungs 2 (two) times daily.    [provider]  albuterol  (PROAIR  HFA) 108 (90 Base) MCG/ACT inhaler Inhale 2 puffs into the lungs every 6 (six) hours as needed for wheezing. 09/10/18   Christi Glendia HERO, MD  albuterol  (PROVENTIL ) (2.5 MG/3ML) 0.083% nebulizer solution Take 2.5 mg by nebulization. Every 4-6 hours    [provider]  alendronate  (FOSAMAX ) 70 MG tablet TAKE 1 TABLET EVERY 7 (SEVEN) DAYS. TAKE WITH A FULL GLASS OF WATER  ON AN EMPTY STOMACH. 11/11/23   Ozell Heron HERO, MD  apixaban  (ELIQUIS ) 5 MG TABS tablet Take 1 tablet (5 mg total) by mouth 2 (two) times daily. 03/23/24   Ozell Heron HERO, MD  Ascorbic Acid (VITAMIN C) 1000 MG tablet Take 1,000 mg by mouth daily.    [provider]  azelastine  (ASTELIN ) 0.1 % nasal spray Instill 2 sprays into each nostril two times a day as needed for allergies 02/03/21   Arloa Suzen RAMAN, NP  azithromycin  (ZITHROMAX  Z-PAK) 250 MG tablet Take 2 tablets on day 1, then 1 tablet daily for 4 days. 06/22/24   Ozell Heron HERO, MD  Bempedoic Acid -Ezetimibe  (NEXLIZET ) 180-10 MG TABS  Take 1 tablet by mouth daily. 10/13/23   Conte, Tessa N, PA-C  betamethasone  valerate ointment (VALISONE ) 0.1 % Apply 1 Application topically as needed.    [provider]  CALCIUM  PO Take 1,200 mg by mouth daily.    [provider]  Cholecalciferol 125 MCG (5000 UT) TABS Take 1 tablet by mouth daily.    [provider]  clobetasol  (TEMOVATE ) 0.05 % external solution APPLY TO SCALP TWICE A DAY *AVOID FACE, GROIN, AND UNDERARMS*    [provider]  desmopressin  (DDAVP ) 0.2 MG tablet Take 0.2 mg by mouth at bedtime.    [provider]  diltiazem  (CARDIZEM  CD) 180 MG 24 hr capsule Take 1 capsule (180 mg total) by mouth daily. 03/23/24   Ozell Heron HERO, MD  EPINEPHrine  0.3 mg/0.3 mL IJ SOAJ injection Inject 0.3 mg into the muscle as needed for anaphylaxis. 12/21/18   [provider]  fexofenadine (ALLEGRA) 180 MG tablet Take 180 mg by mouth daily.    [provider]  Fluocinolone Acetonide Scalp 0.01 % OIL Apply 1 Application topically once a week.    [provider]  furosemide  (LASIX ) 20 MG tablet Take 1 tablet (20 mg total) by mouth daily as needed for edema. 11/11/23   Ozell Heron HERO, MD  hydrALAZINE  (APRESOLINE ) 50 MG tablet Take 50 mg by mouth as needed.    [provider]  hydrOXYzine  (ATARAX /VISTARIL ) 10 MG tablet  Take 10 mg by mouth at bedtime.     [provider]  ipratropium-albuterol  (DUONEB) 0.5-2.5 (3) MG/3ML SOLN Take 3 mLs by nebulization every 6 (six) hours as needed. 02/27/24   Piontek, Rocky, MD  ketoconazole (NIZORAL) 2 % cream Apply 1 Application topically 2 (two) times daily.    [provider]  losartan  (COZAAR ) 100 MG tablet TAKE 1 TABLET BY MOUTH EVERY DAY 06/08/24   Ozell Heron HERO, MD  MAGNESIUM  PO Take 250 mg by mouth daily.    [provider]  montelukast  (SINGULAIR ) 10 MG tablet Take 10 mg by mouth at bedtime.    [provider]  Multiple Vitamin  (MULTIVITAMIN PO) Take 1 tablet by mouth daily.    [provider]  omeprazole  (PRILOSEC) 40 MG capsule TAKE 1 CAPSULE (40 MG TOTAL) BY MOUTH DAILY. 02/09/24   Ozell Heron HERO, MD  OVER THE COUNTER MEDICATION Juice Plus-daily    [provider]  OVER THE COUNTER MEDICATION Nopolea 3 oz once a day    [provider]  pantoprazole  (PROTONIX ) 40 MG tablet Take 1 tablet (40 mg total) by mouth daily. Patient not taking: Reported on 06/24/2024 06/07/24   May, Deanna J, NP  prazosin  (MINIPRESS ) 5 MG capsule Take 1 capsule (5 mg total) by mouth 2 (two) times daily. 03/23/24   Ozell Heron HERO, MD  Probiotic Product (PROBIOTIC PO) Take 1 capsule by mouth daily.     [provider]  Psyllium (METAMUCIL) 0.36 g CAPS Take 1 capsule by mouth daily.    [provider]  vitamin B-12 (CYANOCOBALAMIN ) 100 MCG tablet Take 100 mcg by mouth daily.    [provider]    Allergies: Celebrex [celecoxib], Cephalexin, Hydrochlorothiazide , Penicillin g benzathine, Penicillins, Phenobarbital, Statins, Passion fruit flavoring agent (non-screening), and Tape    Review of Systems  Cardiovascular:  Positive for palpitations.    Updated Vital Signs BP (!) 146/73   Pulse (!) 56   Temp 98.1 F (36.7 C) (Oral)   Resp 18   Ht 5' 2 (1.575 m)   Wt 74.8 kg   SpO2 100%   BMI 30.18 kg/m   Physical Exam Vitals and nursing note reviewed.  Constitutional:      General: She is not in acute distress.    Appearance: She is not toxic-appearing.  HENT:     Head: Normocephalic.     Nose: Nose normal.  Eyes:     Conjunctiva/sclera: Conjunctivae normal.  Cardiovascular:     Rate and Rhythm: Normal rate and regular rhythm.     Pulses: Normal pulses.  Pulmonary:     Effort: Pulmonary effort is normal.     Breath sounds: Normal breath sounds.  Abdominal:     General: Abdomen is flat. There is no distension.     Palpations: Abdomen is soft.     Tenderness: There is no  abdominal tenderness. There is no guarding or rebound.  Musculoskeletal:     Right lower leg: No edema.     Left lower leg: No edema.  Skin:    General: Skin is warm and dry.     Capillary Refill: Capillary refill takes less than 2 seconds.  Neurological:     Mental Status: She is alert and oriented to person, place, and time.  Psychiatric:        Mood and Affect: Mood normal.        Behavior: Behavior normal.     (all labs ordered are listed,  but only abnormal results are displayed) Labs Reviewed  CBC - Abnormal; Notable for the following components:      Result Value   WBC 3.8 (*)    RBC 3.70 (*)    Hemoglobin 11.1 (*)    HCT 32.3 (*)    All other components within normal limits  BASIC METABOLIC PANEL WITH GFR - Abnormal; Notable for the following components:   CO2 21 (*)    All other components within normal limits  MAGNESIUM     EKG: None  Radiology: CT Chest Wo Contrast Result Date: 06/27/2024 CLINICAL DATA:  Abnormal xray - lung nodule, >= 1 cm EXAM: CT CHEST WITHOUT CONTRAST TECHNIQUE: Multidetector CT imaging of the chest was performed following the standard protocol without IV contrast. RADIATION DOSE REDUCTION: This exam was performed according to the departmental dose-optimization program which includes automated exposure control, adjustment of the mA and/or kV according to patient size and/or use of iterative reconstruction technique. COMPARISON:  CTA chest dated 06/18/2023 and 04/24/2022. FINDINGS: Cardiovascular: Cardiomegaly. Dense mitral valve annular calcifications. Foci of coronary artery calcifications. Scattered aortic calcifications. Nonaneurysmal thoracic aorta. Mediastinum/Nodes: No enlarged mediastinal or axillary lymph nodes. Thyroid  gland, trachea, and esophagus demonstrate no significant findings. Lungs/Pleura: Stable 6 mm nodule at the right upper lobe and 3 mm nodule in the right middle lobe, unchanged dating back to multiple prior exams, most compatible  with benign etiologies, for which no follow-up imaging is required. No new or enlarging pulmonary nodule is identified. Linear parenchymal bands at the lung bases likely reflect atelectasis/scarring. No focal consolidation. No pleural effusion or pneumothorax. Upper Abdomen: No acute abnormality. Musculoskeletal: No acute osseous abnormality. No suspicious osseous lesion. Moderate degenerative arthropathy of the bilateral glenohumeral joints. IMPRESSION: 1. No new or enlarging pulmonary nodule is identified. Stable 6 mm nodule at the right upper lobe and 3 mm nodule in the right middle lobe, unchanged dating back to multiple prior exams and most compatible with benign etiologies, for which no follow-up imaging is required. 2. No acute intrathoracic findings. Aortic Atherosclerosis (ICD10-I70.0). Electronically Signed   By: Harrietta Sherry M.D.   On: 06/27/2024 11:50   DG Chest 2 View Result Date: 06/27/2024 CLINICAL DATA:  Pneumonia.  Tachycardia and palpitations. EXAM: CHEST - 2 VIEW COMPARISON:  03/03/2024 FINDINGS: The cardio pericardial silhouette is enlarged. Interstitial markings are diffusely coarsened with chronic features. 10 mm nodular density in the right lower lobe is new in the interval with no evidence for pulmonary nodule in this region on chest CT 06/18/2023. No acute bony abnormality. Telemetry leads overlie the chest. IMPRESSION: 10 mm nodular density in the right lower lobe is new in the interval with no evidence for pulmonary nodule in this region on chest CT 06/18/2023. CT chest without contrast recommended to further evaluate. Cardiomegaly with diffuse chronic interstitial coarsening. Electronically Signed   By: Camellia Candle M.D.   On: 06/27/2024 09:29     Procedures   Medications Ordered in the ED - No data to display  Clinical Course as of 06/27/24 1252  Sun Jun 27, 2024  0948 CBC(!) Leukopenic and anemic.  However hemoglobin appears to be similar to baseline [TY]  1247  Patient continues to be sinus rhythm on the monitor heart rate in the upper 50s low 60s.  Labs with no significant metabolic derangements.  Normal kidney function.  Magnesium  level normal.  Stable chronic anemia chest x-ray with possible new nodule, follow-up CT chest however without abnormality.  Patient is feeling well and  otherwise asymptomatic currently.  Discussed follow-up with her cardiologist.  Will discharge in stable condition. [TY]    Clinical Course User Index [TY] Neysa Caron PARAS, DO                                 Medical Decision Making Is a 83 year old female presenting emergency department for palpitations.  She is afebrile nontachycardic normotensive.  Maintaining oxygen  saturation on room air.  Does not appear to be in respiratory distress.  Clear lung sounds.  Is normal sinus rhythm on the monitor as independently interpreted by me.  EKG without ST segment changes to indicate ischemia.  She is not having current chest pain either.  Screening labs ordered to evaluate for metabolic derangements that would precipitate A-fib also chest x-ray given her complaint of cough.  Amount and/or Complexity of Data Reviewed Independent Historian:     Details: Husband notes on Eliquis  and diltiazem  External Data Reviewed:     Details: Normal EF in May of this year Labs: ordered. Decision-making details documented in ED Course.    Details: CT course Radiology: ordered and independent interpretation performed.    Details: Do not appreciate obvious pneumonia or pneumothorax on my independent review of chest x-ray ECG/medicine tests: ordered and independent interpretation performed.    Details: No STEMI  Risk Decision regarding hospitalization. Diagnosis or treatment significantly limited by social determinants of health.       Final diagnoses:  None    ED Discharge Orders     None          Neysa Caron PARAS, DO 06/27/24 1252

## 2024-06-27 NOTE — ED Triage Notes (Signed)
 Pt c.o tachycardia and palpitations since yesterday afternoon around 5 pm. Pt states she has been dealing with asthma all week with dry cough and congestion. Denies fever

## 2024-06-28 DIAGNOSIS — J301 Allergic rhinitis due to pollen: Secondary | ICD-10-CM | POA: Diagnosis not present

## 2024-06-28 DIAGNOSIS — J4540 Moderate persistent asthma, uncomplicated: Secondary | ICD-10-CM | POA: Diagnosis not present

## 2024-06-28 DIAGNOSIS — K219 Gastro-esophageal reflux disease without esophagitis: Secondary | ICD-10-CM | POA: Diagnosis not present

## 2024-06-28 DIAGNOSIS — J3081 Allergic rhinitis due to animal (cat) (dog) hair and dander: Secondary | ICD-10-CM | POA: Diagnosis not present

## 2024-06-28 DIAGNOSIS — J3089 Other allergic rhinitis: Secondary | ICD-10-CM | POA: Diagnosis not present

## 2024-06-28 DIAGNOSIS — J189 Pneumonia, unspecified organism: Secondary | ICD-10-CM | POA: Diagnosis not present

## 2024-06-29 ENCOUNTER — Telehealth: Payer: Self-pay | Admitting: Gastroenterology

## 2024-06-29 NOTE — Telephone Encounter (Signed)
 Called patient back and we discussed swallow study results. She has not tried new medication PPI therapy as she is is out of town. She will try it starting next week. We discussed dysphagia precautions and trying new PPI therapy for 2-3 weeks then update me. Pt verbalized understanding.

## 2024-06-29 NOTE — Telephone Encounter (Signed)
 Attempted to call Number on file to discuss barium swallow study results.  No answer left voicemail.

## 2024-06-29 NOTE — Telephone Encounter (Signed)
 Good Morning Brianna Mccarty,  Patient returned your call, requesting a call back to discuss results. Please advise.  Thank you!

## 2024-07-01 ENCOUNTER — Ambulatory Visit: Payer: Self-pay | Admitting: Gastroenterology

## 2024-07-01 DIAGNOSIS — Z8619 Personal history of other infectious and parasitic diseases: Secondary | ICD-10-CM

## 2024-07-02 NOTE — Progress Notes (Signed)
 Thyroid  US  is benign, no further work up is needed

## 2024-07-07 DIAGNOSIS — J3089 Other allergic rhinitis: Secondary | ICD-10-CM | POA: Diagnosis not present

## 2024-07-07 DIAGNOSIS — J3081 Allergic rhinitis due to animal (cat) (dog) hair and dander: Secondary | ICD-10-CM | POA: Diagnosis not present

## 2024-07-07 DIAGNOSIS — J301 Allergic rhinitis due to pollen: Secondary | ICD-10-CM | POA: Diagnosis not present

## 2024-07-14 DIAGNOSIS — J3081 Allergic rhinitis due to animal (cat) (dog) hair and dander: Secondary | ICD-10-CM | POA: Diagnosis not present

## 2024-07-14 DIAGNOSIS — J3089 Other allergic rhinitis: Secondary | ICD-10-CM | POA: Diagnosis not present

## 2024-07-14 DIAGNOSIS — J301 Allergic rhinitis due to pollen: Secondary | ICD-10-CM | POA: Diagnosis not present

## 2024-07-14 MED ORDER — ACYCLOVIR 400 MG PO TABS
400.0000 mg | ORAL_TABLET | Freq: Four times a day (QID) | ORAL | 2 refills | Status: AC
Start: 2024-07-14 — End: ?

## 2024-07-14 NOTE — Addendum Note (Signed)
 Addended by: Arabell Neria M on: 07/14/2024 01:54 PM   Modules accepted: Orders

## 2024-07-14 NOTE — Progress Notes (Signed)
 Script sent

## 2024-07-15 DIAGNOSIS — M7918 Myalgia, other site: Secondary | ICD-10-CM | POA: Diagnosis not present

## 2024-07-15 DIAGNOSIS — M47812 Spondylosis without myelopathy or radiculopathy, cervical region: Secondary | ICD-10-CM | POA: Diagnosis not present

## 2024-07-18 DIAGNOSIS — M62838 Other muscle spasm: Secondary | ICD-10-CM | POA: Diagnosis not present

## 2024-07-20 ENCOUNTER — Emergency Department (HOSPITAL_COMMUNITY)
Admission: EM | Admit: 2024-07-20 | Discharge: 2024-07-20 | Disposition: A | Discharge status: Home / Self Care | Attending: Emergency Medicine | Admitting: Emergency Medicine

## 2024-07-20 ENCOUNTER — Other Ambulatory Visit: Payer: Self-pay

## 2024-07-20 ENCOUNTER — Telehealth: Payer: Self-pay | Admitting: Gastroenterology

## 2024-07-20 ENCOUNTER — Encounter (HOSPITAL_COMMUNITY): Payer: Self-pay

## 2024-07-20 DIAGNOSIS — M5412 Radiculopathy, cervical region: Secondary | ICD-10-CM | POA: Diagnosis not present

## 2024-07-20 DIAGNOSIS — M542 Cervicalgia: Secondary | ICD-10-CM | POA: Diagnosis not present

## 2024-07-20 DIAGNOSIS — Z7901 Long term (current) use of anticoagulants: Secondary | ICD-10-CM | POA: Diagnosis not present

## 2024-07-20 DIAGNOSIS — Z79899 Other long term (current) drug therapy: Secondary | ICD-10-CM | POA: Insufficient documentation

## 2024-07-20 DIAGNOSIS — I10 Essential (primary) hypertension: Secondary | ICD-10-CM | POA: Diagnosis not present

## 2024-07-20 DIAGNOSIS — I4891 Unspecified atrial fibrillation: Secondary | ICD-10-CM | POA: Insufficient documentation

## 2024-07-20 MED ORDER — LIDOCAINE 5 % EX PTCH
1.0000 | MEDICATED_PATCH | CUTANEOUS | Status: DC
  Administered 2024-07-20: 1 via TRANSDERMAL
  Filled 2024-07-20: qty 1

## 2024-07-20 MED ORDER — MORPHINE SULFATE (PF) 4 MG/ML IV SOLN
4.0000 mg | Freq: Once | INTRAVENOUS | Status: AC
Start: 2024-07-20 — End: 2024-07-20
  Administered 2024-07-20: 4 mg via INTRAMUSCULAR
  Filled 2024-07-20: qty 1

## 2024-07-20 MED ORDER — OXYCODONE-ACETAMINOPHEN 5-325 MG PO TABS
1.0000 | ORAL_TABLET | Freq: Once | ORAL | Status: AC
Start: 2024-07-20 — End: 2024-07-20
  Administered 2024-07-20: 1 via ORAL
  Filled 2024-07-20: qty 1

## 2024-07-20 MED ORDER — HYDROCODONE-ACETAMINOPHEN 5-325 MG PO TABS: 1.0000 | ORAL_TABLET | ORAL | 0 refills | Status: AC | PRN

## 2024-07-20 NOTE — Telephone Encounter (Signed)
 May, Deanna J, NP sent to Mannie Corean GORMAN Manuelita thank you please note on encounter we tried       Previous Messages    ----- Message ----- From: Mannie Corean GORMAN Sent: 07/20/2024   8:53 AM EDT To: Cathryne PARAS May, NP  Patient states that she is traveling and will callback to schedule follow up.  ----- Message ----- From: May, Deanna J, NP Sent: 07/20/2024   8:37 AM EDT To: Corean GORMAN Mannie Corean- can you have this patient followup in office with me in next 1-2 mths  Thanks  Deanna, NP

## 2024-07-20 NOTE — ED Notes (Signed)
 Pt BP 182/94 as she has not taken her nightly home dose of BP meds, pt states she will take BP med when she gets home.

## 2024-07-20 NOTE — Discharge Instructions (Addendum)
 Return if any problems.

## 2024-07-20 NOTE — ED Provider Triage Note (Signed)
 Emergency Medicine Provider Triage Evaluation Note  Brianna Mccarty , a 83 y.o. female  was evaluated in triage.  Pt complains of neck left sided radiates to left arm. Feels N. No CP/SOB.  Review of Systems  Positive: Left neck and shoulder pain Negative: CP  Physical Exam  BP (!) 158/92 (BP Location: Right Arm)   Pulse 89   Temp 98.1 F (36.7 C)   Resp 17   Ht 5' 1 (1.549 m)   Wt 71.7 kg   SpO2 97%   BMI 29.85 kg/m  Gen:   Awake, no distress   Resp:  Normal effort  MSK:   Moves extremities without difficulty  Other:    Medical Decision Making  Medically screening exam initiated at 1:40 PM.  Appropriate orders placed.  SHAWNTELL DIXSON was informed that the remainder of the evaluation will be completed by another provider, this initial triage assessment does not replace that evaluation, and the importance of remaining in the ED until their evaluation is complete.     Shermon Warren SAILOR, PA-C 07/20/24 1341

## 2024-07-20 NOTE — ED Provider Notes (Signed)
 [name] Provider Note   CSN: [id number] Arrival date & time: 07/20/24  1300     Patient presents with: Neck Pain   Brianna Mccarty is a 83 y.o. female.   Patient complains of pain to her neck.  Patient reports that she has a past medical history of neck pain.  Patient saw her orthopedist on 9/11 and was given trigger point injections.  [place] the beach and had pain there.  Patient went to an urgent care and was given a prescription for a muscle relaxer.  Patient states she has not had any relief from the muscle relaxer.  Patient states in the past when she has had similar neck problems she has gone on prednisone .  Patient states that the prednisone  has caused her to have tachycardia in the past.  Patient states that she now has atrial fibrillation and is on Cardizem  and Eliquis .  Patient is reluctant to go on prednisone  without discussing it with her cardiologist.  Patient reports that the pain medication that she was given at triage has not helped.  Patient received oxycodone  at triage.  Patient denies any numbness in her arms.  Patient denies any weakness   Neck Pain      Prior to Admission medications   Medication Sig Start Date End Date Taking? Authorizing Provider  HYDROcodone -acetaminophen  (NORCO/VICODIN) 5-325 MG tablet Take 1 tablet by mouth every 4 (four) hours as needed for up to 5 days. 07/20/24 07/25/24 Yes Brianna Mccarty K, PA-C  acyclovir  (ZOVIRAX ) 400 MG tablet Take 1 tablet (400 mg total) by mouth 4 (four) times daily. As needed for outbreak. 07/14/24   Brianna Mccarty HERO, MD  ADVAIR [facility] 115-21 MCG/ACT inhaler Inhale 2 puffs into the lungs 2 (two) times daily.    [provider]  albuterol  (PROAIR  HFA) 108 (90 Base) MCG/ACT inhaler Inhale 2 puffs into the lungs every 6 (six) hours as needed for wheezing. 09/10/18   Brianna Glendia HERO, MD  albuterol  (PROVENTIL ) (2.5 MG/3ML) 0.083% nebulizer solution Take 2.5 mg by  nebulization. Every 4-6 hours    [provider]  alendronate  (FOSAMAX ) 70 MG tablet TAKE 1 TABLET EVERY 7 (SEVEN) DAYS. TAKE WITH A FULL GLASS OF WATER  ON AN EMPTY STOMACH. 11/11/23   Brianna Mccarty HERO, MD  apixaban  (ELIQUIS ) 5 MG TABS tablet Take 1 tablet (5 mg total) by mouth 2 (two) times daily. 03/23/24   Brianna Mccarty HERO, MD  Ascorbic Acid (VITAMIN C) 1000 MG tablet Take 1,000 mg by mouth daily.    [provider]  azelastine  (ASTELIN ) 0.1 % nasal spray Instill 2 sprays into each nostril two times a day as needed for allergies 02/03/21   Brianna Suzen RAMAN, NP  azithromycin  (ZITHROMAX  Z-PAK) 250 MG tablet Take 2 tablets on day 1, then 1 tablet daily for 4 days. 06/22/24   Brianna Mccarty HERO, MD  Bempedoic Acid -Ezetimibe  (NEXLIZET ) 180-10 MG TABS Take 1 tablet by mouth daily. 10/13/23   Brianna Orren SAILOR, PA-C  benzonatate  (TESSALON ) 100 MG capsule Take 1 capsule (100 mg total) by mouth every 8 (eight) hours. 06/27/24   Brianna Caron PARAS, DO  betamethasone  valerate ointment (VALISONE ) 0.1 % Apply 1 Application topically as needed.    [provider]  CALCIUM  PO Take 1,200 mg by mouth daily.    [provider]  Cholecalciferol 125 MCG (5000 UT) TABS Take 1 tablet by mouth daily.    [provider]  clobetasol  (  TEMOVATE ) 0.05 % external solution APPLY TO SCALP TWICE A DAY *AVOID FACE, GROIN, AND UNDERARMS*    [provider]  desmopressin  (DDAVP ) 0.2 MG tablet Take 0.2 mg by mouth at bedtime.    [provider]  diltiazem  (CARDIZEM  CD) 180 MG 24 hr capsule Take 1 capsule (180 mg total) by mouth daily. 03/23/24   Brianna Mccarty HERO, MD  EPINEPHrine  0.3 mg/0.3 mL IJ SOAJ injection Inject 0.3 mg into the muscle as needed for anaphylaxis. 12/21/18   [provider]  fexofenadine (ALLEGRA) 180 MG tablet Take 180 mg by mouth daily.    [provider]  Fluocinolone Acetonide Scalp 0.01 % OIL Apply 1 Application topically once a week.     [provider]  furosemide  (LASIX ) 20 MG tablet Take 1 tablet (20 mg total) by mouth daily as needed for edema. 11/11/23   Brianna Mccarty HERO, MD  hydrALAZINE  (APRESOLINE ) 50 MG tablet Take 50 mg by mouth as needed.    [provider]  hydrOXYzine  (ATARAX /VISTARIL ) 10 MG tablet Take 10 mg by mouth at bedtime.     [provider]  ipratropium-albuterol  (DUONEB) 0.5-2.5 (3) MG/3ML SOLN Take 3 mLs by nebulization every 6 (six) hours as needed. 02/27/24   Mccarty, Rocky, MD  ketoconazole (NIZORAL) 2 % cream Apply 1 Application topically 2 (two) times daily.    [provider]  losartan  (COZAAR ) 100 MG tablet TAKE 1 TABLET BY MOUTH EVERY DAY 06/08/24   Brianna Mccarty HERO, MD  MAGNESIUM  PO Take 250 mg by mouth daily.    [provider]  montelukast  (SINGULAIR ) 10 MG tablet Take 10 mg by mouth at bedtime.    [provider]  Multiple Vitamin (MULTIVITAMIN PO) Take 1 tablet by mouth daily.    [provider]  omeprazole  (PRILOSEC) 40 MG capsule TAKE 1 CAPSULE (40 MG TOTAL) BY MOUTH DAILY. 02/09/24   Brianna Mccarty HERO, MD  OVER THE COUNTER MEDICATION Juice Plus-daily    [provider]  OVER THE COUNTER MEDICATION Nopolea 3 oz once a day    [provider]  pantoprazole  (PROTONIX ) 40 MG tablet Take 1 tablet (40 mg total) by mouth daily. Patient not taking: Reported on 06/24/2024 06/07/24   Mccarty, Brianna J, NP  prazosin  (MINIPRESS ) 5 MG capsule Take 1 capsule (5 mg total) by mouth 2 (two) times daily. 03/23/24   Brianna Mccarty HERO, MD  Probiotic Product (PROBIOTIC PO) Take 1 capsule by mouth daily.     [provider]  Psyllium (METAMUCIL) 0.36 g CAPS Take 1 capsule by mouth daily.    [provider]  vitamin B-12 (CYANOCOBALAMIN ) 100 MCG tablet Take 100 mcg by mouth daily.    [provider]    Allergies: Celebrex [celecoxib], Cephalexin, Hydrochlorothiazide , Penicillin g benzathine, Penicillins,  Phenobarbital, Statins, Passion fruit flavoring agent (non-screening), and Tape    Review of Systems  Musculoskeletal:  Positive for neck pain.  All other systems reviewed and are negative.   Updated Vital Signs BP (!) 182/94 (BP Location: Left Arm)   Pulse 63   Temp 98.3 F (36.8 C) (Oral)   Resp 17   Ht 5' 1 (1.549 m)   Wt 71.7 kg   SpO2 100%   BMI 29.85 kg/m   Physical Exam Vitals and nursing note reviewed.  Constitutional:      Appearance: She is well-developed.  HENT:     Head: Normocephalic.  Neck:     Comments: Tender left trapezius area  to palpation pain with range of motion of neck, normal reflexes neurovascular neurosensory intact Cardiovascular:     Rate and Rhythm: Normal rate.  Pulmonary:     Effort: Pulmonary effort is normal.  Abdominal:     General: There is no distension.  Musculoskeletal:        General: Normal range of motion.     Cervical back: Normal range of motion. Tenderness present.  Skin:    General: Skin is warm.  Neurological:     General: No focal deficit present.     Mental Status: She is alert and oriented to person, place, and time.     (all labs ordered are listed, but only abnormal results are displayed) Labs Reviewed - No data to display  EKG: EKG Interpretation Date/Time:  Tuesday July 20 2024 13:53:05 EDT Ventricular Rate:  68 PR Interval:  154 QRS Duration:  96 QT Interval:  424 QTC Calculation: 450 R Axis:   -54  Text Interpretation: Normal sinus rhythm Incomplete right bundle branch block Left anterior fascicular block Anterior infarct , age undetermined Abnormal ECG Confirmed by Jerrol Agent ([id number]) on 07/20/2024 8:14:44 PM  Radiology: No results found.   Procedures   Medications Ordered in the ED  lidocaine  (LIDODERM ) 5 % 1 patch (1 patch Transdermal Patch Applied 07/20/24 2046)  oxyCODONE -acetaminophen  (PERCOCET/ROXICET) 5-325 MG per tablet 1 tablet (1 tablet Oral Given 07/20/24 1359)  morphine  (PF) 4  MG/ML injection 4 mg (4 mg Intramuscular Given 07/20/24 2029)                                    Medical Decision Making Patient complains of pain in her neck.  She had no relief with trigger point injections on 911.  Patient has had no relief with muscle relaxer.  She reports no relief with oxycodone  at triage  Amount and/or Complexity of Data Reviewed External Data Reviewed: notes.    Details: [facility] notes reviewed  Risk Prescription drug management. Risk Details: Patient is given a lidocaine  patch.  Patient is given 4 mg of morphine  IM.  Patient feels much improved.  Patient is not wanting to start prednisone  at this time.  Patient is given a prescription for hydrocodone .  She is advised to schedule to see her orthopedist for recheck.        Final diagnoses:  Neck pain    ED Discharge Orders          Ordered    HYDROcodone -acetaminophen  (NORCO/VICODIN) 5-325 MG tablet  Every 4 hours PRN        07/20/24 2126           An After Visit Summary was printed and given to the patient.     Flint Sonny POUR, PA-C 07/20/24 2314    Jerrol Agent, MD 07/21/24 [phone]

## 2024-07-20 NOTE — ED Triage Notes (Signed)
 Pt is here for evaluation of neck and shoulder pain for several days.  Pt was seen at [facility] in [facility] where she was prescribed muscle relaxers which did not help.  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  No recent injuries.

## 2024-07-23 DIAGNOSIS — M542 Cervicalgia: Secondary | ICD-10-CM | POA: Diagnosis not present

## 2024-07-27 DIAGNOSIS — J3089 Other allergic rhinitis: Secondary | ICD-10-CM | POA: Diagnosis not present

## 2024-07-27 DIAGNOSIS — J301 Allergic rhinitis due to pollen: Secondary | ICD-10-CM | POA: Diagnosis not present

## 2024-07-27 DIAGNOSIS — J3081 Allergic rhinitis due to animal (cat) (dog) hair and dander: Secondary | ICD-10-CM | POA: Diagnosis not present

## 2024-08-02 ENCOUNTER — Other Ambulatory Visit: Payer: Self-pay | Admitting: Family Medicine

## 2024-08-02 DIAGNOSIS — I4811 Longstanding persistent atrial fibrillation: Secondary | ICD-10-CM

## 2024-08-04 ENCOUNTER — Other Ambulatory Visit: Payer: Self-pay | Admitting: Family Medicine

## 2024-08-04 DIAGNOSIS — I4811 Longstanding persistent atrial fibrillation: Secondary | ICD-10-CM

## 2024-08-09 DIAGNOSIS — J3081 Allergic rhinitis due to animal (cat) (dog) hair and dander: Secondary | ICD-10-CM | POA: Diagnosis not present

## 2024-08-09 DIAGNOSIS — J3089 Other allergic rhinitis: Secondary | ICD-10-CM | POA: Diagnosis not present

## 2024-08-09 DIAGNOSIS — J301 Allergic rhinitis due to pollen: Secondary | ICD-10-CM | POA: Diagnosis not present

## 2024-08-17 DIAGNOSIS — H524 Presbyopia: Secondary | ICD-10-CM | POA: Diagnosis not present

## 2024-08-17 DIAGNOSIS — H35373 Puckering of macula, bilateral: Secondary | ICD-10-CM | POA: Diagnosis not present

## 2024-08-17 DIAGNOSIS — H40013 Open angle with borderline findings, low risk, bilateral: Secondary | ICD-10-CM | POA: Diagnosis not present

## 2024-08-17 DIAGNOSIS — H3554 Dystrophies primarily involving the retinal pigment epithelium: Secondary | ICD-10-CM | POA: Diagnosis not present

## 2024-08-17 DIAGNOSIS — H35341 Macular cyst, hole, or pseudohole, right eye: Secondary | ICD-10-CM | POA: Diagnosis not present

## 2024-08-19 DIAGNOSIS — J3081 Allergic rhinitis due to animal (cat) (dog) hair and dander: Secondary | ICD-10-CM | POA: Diagnosis not present

## 2024-08-19 DIAGNOSIS — J3089 Other allergic rhinitis: Secondary | ICD-10-CM | POA: Diagnosis not present

## 2024-08-19 DIAGNOSIS — J301 Allergic rhinitis due to pollen: Secondary | ICD-10-CM | POA: Diagnosis not present

## 2024-08-24 DIAGNOSIS — M542 Cervicalgia: Secondary | ICD-10-CM | POA: Diagnosis not present

## 2024-08-30 ENCOUNTER — Telehealth: Payer: Self-pay | Admitting: Pharmacy Technician

## 2024-08-30 DIAGNOSIS — E785 Hyperlipidemia, unspecified: Secondary | ICD-10-CM

## 2024-08-30 DIAGNOSIS — E7800 Pure hypercholesterolemia, unspecified: Secondary | ICD-10-CM

## 2024-08-30 NOTE — Telephone Encounter (Signed)
 S/w the patient, informed her that lab work is needed for her insurance to complete prior auth for Nexlizet .  Order placed for lipid panel. She is aware she will need to be fasting. She will come to this office for lab work. She verbalized understanding of all information.

## 2024-08-30 NOTE — Telephone Encounter (Signed)
 Please advise lab orders? Do you want CMP and lipid or just lipid?

## 2024-08-30 NOTE — Telephone Encounter (Signed)
 Hi, insurance is asking for lipid labs done in the last 120 days to finish this prior authorization for nexlizet . The last labs I see are from 11/11/23. Can she get updated labs ? Thank you

## 2024-09-08 DIAGNOSIS — E7800 Pure hypercholesterolemia, unspecified: Secondary | ICD-10-CM | POA: Diagnosis not present

## 2024-09-09 LAB — LIPID PANEL
Chol/HDL Ratio: 2.2 ratio (ref 0.0–4.4)
Cholesterol, Total: 182 mg/dL (ref 100–199)
HDL: 82 mg/dL (ref >39)
LDL Chol Calc (NIH): 92 mg/dL (ref 0–99)
Triglycerides: 39 mg/dL (ref 0–149)
VLDL Cholesterol Cal: 8 mg/dL (ref 5–40)

## 2024-09-10 DIAGNOSIS — J301 Allergic rhinitis due to pollen: Secondary | ICD-10-CM | POA: Diagnosis not present

## 2024-09-10 DIAGNOSIS — J3089 Other allergic rhinitis: Secondary | ICD-10-CM | POA: Diagnosis not present

## 2024-09-10 DIAGNOSIS — J3081 Allergic rhinitis due to animal (cat) (dog) hair and dander: Secondary | ICD-10-CM | POA: Diagnosis not present

## 2024-09-11 ENCOUNTER — Ambulatory Visit: Payer: Self-pay | Admitting: Internal Medicine

## 2024-09-13 NOTE — Telephone Encounter (Signed)
 Pharmacy Patient Advocate Encounter   Received notification from CoverMyMeds that prior authorization for NEXLIZET  is required/requested.   Insurance verification completed.   The patient is insured through [facility] ADVANTAGE/RX ADVANCE.   Per test claim: PA required; PA submitted to above mentioned insurance via Latent Key/confirmation #/EOC [id number] Status is pending

## 2024-09-13 NOTE — Telephone Encounter (Signed)
 Pharmacy Patient Advocate Encounter  Received notification from [employer] that Prior Authorization for nexlizet  has been APPROVED from 09/13/24 to 09/13/25   PA #/Case ID/Reference #: [id number]

## 2024-09-16 DIAGNOSIS — J3089 Other allergic rhinitis: Secondary | ICD-10-CM | POA: Diagnosis not present

## 2024-09-16 DIAGNOSIS — J301 Allergic rhinitis due to pollen: Secondary | ICD-10-CM | POA: Diagnosis not present

## 2024-09-16 DIAGNOSIS — J3081 Allergic rhinitis due to animal (cat) (dog) hair and dander: Secondary | ICD-10-CM | POA: Diagnosis not present

## 2024-09-23 ENCOUNTER — Ambulatory Visit (INDEPENDENT_AMBULATORY_CARE_PROVIDER_SITE_OTHER): Admitting: Gastroenterology

## 2024-09-23 ENCOUNTER — Other Ambulatory Visit: Payer: Self-pay | Admitting: General Surgery

## 2024-09-23 ENCOUNTER — Encounter: Payer: Self-pay | Admitting: Gastroenterology

## 2024-09-23 VITALS — BP 112/64 | HR 69 | Ht 60.0 in | Wt 155.0 lb

## 2024-09-23 DIAGNOSIS — K862 Cyst of pancreas: Secondary | ICD-10-CM

## 2024-09-23 DIAGNOSIS — K219 Gastro-esophageal reflux disease without esophagitis: Secondary | ICD-10-CM | POA: Diagnosis not present

## 2024-09-23 DIAGNOSIS — K449 Diaphragmatic hernia without obstruction or gangrene: Secondary | ICD-10-CM | POA: Diagnosis not present

## 2024-09-23 DIAGNOSIS — R221 Localized swelling, mass and lump, neck: Secondary | ICD-10-CM | POA: Diagnosis not present

## 2024-09-23 DIAGNOSIS — R1314 Dysphagia, pharyngoesophageal phase: Secondary | ICD-10-CM | POA: Diagnosis not present

## 2024-09-23 NOTE — Patient Instructions (Addendum)
 GERD - Continue pantoprazole  40 mg po daily  - Reinforce GERD diet, no late meals 3-4 hours before lying down   Dysphagia with pills Can coat with pudding or applesauce  _______________________________________________________  If your blood pressure at your visit was 140/90 or greater, please contact your primary care physician to follow up on this.  _______________________________________________________  If you are age 84 or older, your body mass index should be between 23-30. Your Body mass index is 30.27 kg/m. If this is out of the aforementioned range listed, please consider follow up with your Primary Care Provider.  If you are age 14 or younger, your body mass index should be between 19-25. Your Body mass index is 30.27 kg/m. If this is out of the aformentioned range listed, please consider follow up with your Primary Care Provider.   ________________________________________________________  The [place] [facility] providers would like to encourage you to use MYCHART to communicate with providers for non-urgent requests or questions.  Due to long hold times on the telephone, sending your provider a message by [facility] may be a faster and more efficient way to get a response.  Please allow 48 business hours for a response.  Please remember that this is for non-urgent requests.  _______________________________________________________  [facility] is using a team-based approach to care.  Your team is made up of your doctor and two to three APPS. Our APPS (Nurse Practitioners and Physician Assistants) work with your physician to ensure care continuity for you. They are fully qualified to address your health concerns and develop a treatment plan. They communicate directly with your gastroenterologist to care for you. Seeing the Advanced Practice Practitioners on your physician's team can help you by facilitating care more promptly, often allowing for earlier appointments, access to  diagnostic testing, procedures, and other specialty referrals.   Thank you for trusting me with your gastrointestinal care. Deanna May, FNP-C

## 2024-09-23 NOTE — Progress Notes (Addendum)
 Chief Complaint: Dysphagia Primary GI Doctor:(Previously Dr. Aneita) Dr. Suzann  HPI:  Patient is a  83  year old female patient, previously known to Dr. Aneita, with past medical history of atrial fibrillation, hypertension, Moderate mitral valve regurgitation and aortic valve stenosis, GERD, thyroid  cyst, who was self referred to me for a evaluation of dysphagia.  03/06/24 ED visit for palpitations. Patient was diagnosed with asthma exacerbation has outpatient and was treated with bronchodilator therapy, steroids and antibiotics. On the day of admission while sitting she had severe palpitations and chest discomfort. Patient has converted to sinus rhythm after direct current cardioversion. Plan to continue diltiazem  for rate control and for anticoagulation apixaban .   03/16/2024 last seen by cardiology for atrial fibrillation and hypertension.  Reviewed entire note.  Interval History Patient presents for follow-up on dysphagia . I switched her from pantoprazole  from omeprazole  40 mg which she states has helped with the reflux as well as the dysphagia. She has occasional issue  with elongated pills.  Patient on apixaban  5mg  twice daily for atrial fibrillation  Patient's family history includes pancreatic CA in father age 61, brother with bile duct CA at age 56.  GI PROCEDURES: Colonoscopy/EGD  ( 12/18/22) with Dr. Aneita for diarrhea and IDA Colonoscopy - Two 6 to 7 mm polyps in the ascending colon, removed with a cold snare. Resected and retrieved. - A single non- bleeding colonic angioectasia. - Moderate diverticulosis in the left colon. - Internal hemorrhoids. - The examination was otherwise normal on direct and retroflexion views. Biopsied. Path:  1. Surgical [P], colon, ascending, polyp (2) - TUBULAR ADENOMA (1 OF 2 FRAGMENTS) - NEGATIVE FOR HIGH-GRADE DYSPLASIA - POLYPOID COLONIC MUCOSA WITH FOCAL, SURFACE HYPERPLASTIC CHANGES AND LYMPHOID AGGREGATE (1 OF 2 FRAGMENTS) - NEGATIVE FOR  HIGH-GRADE DYSPLASIA OR MALIGNANCY 2. Surgical [P], random sites colon - BENIGN COLONIC MUCOSA WITHOUT DIAGNOSTIC ABNORMALITY - NEGATIVE FOR MICROSCOPIC COLITIDES  EGD - Normal esophagus. - Medium- sized hiatal hernia. - Erythematous mucosa in the gastric body. Biopsied. - Mucosal changes in the duodenal bulb. Biopsied. - Otherwise normal appearing duodenum. Biopsied. Path:  Surgical [P], duodenal - DUODENAL MUCOSA WITH FOCAL PSEUDOMELANOSIS DUODENI - NEGATIVE FOR CELIAC CHANGE 4. Surgical [P], gastric antrum and gastric body - GASTRIC, OXYNTIC AND TRANSITIONAL TYPE MUCOSA WITH REACTIVE (CHEMICAL) GASTROPATHY - OXYNTIC MUCOSA WITH EARLY FUNDIC GLAND POLYP LIKE CHANGES/EARLY, FOCAL PROTON PUMP INHIBITOR TYPE EFFECT - NEGATIVE FOR HELICOBACTER ORGANISMS ON H&E STAIN  Colonoscopy 02/07/2015: 1. Mild diverticulosis was noted in the sigmoid colon 2. 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 mild by carotid duplex.   Cervical dysplasia 1971   Coronary artery disease    Diverticulosis of colon    DJD (degenerative joint disease)    Family history of cancer of extrahepatic bile ducts 09/05/2020   Family history of colon cancer 09/05/2020   Family history of pancreatic cancer 09/05/2020   GERD (gastroesophageal reflux disease)  Headache(784.0)    History of kidney stones    Hypercholesterolemia    Hypertension    Interstitial cystitis    sees urologist   Lichen sclerosus    Malignant melanoma (HCC) 2006   sees Dr.  Shona in dermatology   Migraines    Mild aortic stenosis    Mitral valve disease    Question mitral valve prolapse in the past, no prolapse by echo 2009   Murmur 10/20/2015   SEES DR NELSON   OSA (obstructive sleep apnea)    USES DENTAL DEVICE   Osteoporosis    on fosomax > 5 years, stopped 11/2015   Pneumonia    SVT (supraventricular tachycardia) 02/15/2022   Monitor 02/2022: NSR, avg HR 61; 2 runs of NSVT (4 beats); several runs of Supraventricular Tachycardia (longest 311'); no AFib   Thyroid  cyst    1 x 1.1 thyroid  cyst noted on carotid Doppler, January, 2012   Venous insufficiency     Past Surgical History:  Procedure Laterality Date   BREAST BIOPSY Left 11/25/2011   U/S core, benign performed at [facility], LEFT BREAT MARKER IN   BREAST CYST ASPIRATION     BREAST SURGERY  2013   Breast Bx-Benign   CARDIAC CATHETERIZATION     CARDIOVERSION N/A 03/05/2024   Procedure: CARDIOVERSION;  Surgeon: Loni Soyla LABOR, MD;  Location: [facility] INVASIVE CV LAB;  Service: Cardiovascular;  Laterality: N/A;   CATARACT EXTRACTION, BILATERAL     CHOLECYSTECTOMY N/A 12/22/2019   Procedure: LAPAROSCOPIC CHOLECYSTECTOMY;  Surgeon: Teresa Lonni HERO, MD;  Location: [facility];  Service: General;  Laterality: N/A;   cystoscopy and basket stone removal right ureter  02/2006   Dr. Andra   GALLBLADDER SURGERY  12/22/2019   GYNECOLOGIC CRYOSURGERY  1971   left total hip replacement  2004   Dr. Heide   melanoma removed from medial rleft knee area  2006   Dr. Shona   NASAL SEPTUM SURGERY     TOTAL HIP ARTHROPLASTY Right 08/01/2021   Procedure: TOTAL HIP ARTHROPLASTY ANTERIOR APPROACH;  Surgeon: Melodi Lerner, MD;  Location: [facility] ORS;  Service: Orthopedics;  Laterality: Right;   TRANSESOPHAGEAL ECHOCARDIOGRAM (CATH LAB) N/A 03/05/2024   Procedure: TRANSESOPHAGEAL ECHOCARDIOGRAM;  Surgeon: Loni Soyla LABOR, MD;  Location: [facility] INVASIVE CV LAB;  Service: Cardiovascular;  Laterality: N/A;     Current Outpatient Medications  Medication Sig Dispense Refill   acyclovir  (ZOVIRAX ) 400 MG tablet Take 1 tablet (400 mg total) by mouth 4 (four) times daily. As needed for outbreak. 30 tablet 2   ADVAIR HFA 115-21 MCG/ACT inhaler Inhale 2 puffs into the lungs 2 (two) times daily.     albuterol  (PROAIR  HFA) 108 (90 Base) MCG/ACT inhaler Inhale 2 puffs into the lungs every 6 (six) hours as needed for wheezing. 3 Inhaler 3   albuterol  (PROVENTIL ) (2.5 MG/3ML) 0.083% nebulizer solution Take 2.5 mg by nebulization. Every 4-6 hours     alendronate  (FOSAMAX ) 70 MG tablet TAKE 1 TABLET EVERY 7 (SEVEN) DAYS. TAKE WITH A FULL GLASS OF WATER  ON AN EMPTY STOMACH. 12 tablet 3   Ascorbic Acid (VITAMIN C) 1000 MG tablet Take 1,000 mg by mouth daily.     azelastine  (ASTELIN ) 0.1 % nasal spray Instill 2 sprays into each nostril two times a day as needed for allergies 30 mL 0   Bempedoic Acid -Ezetimibe  (NEXLIZET ) 180-10 MG TABS Take 1 tablet by mouth daily. 90 tablet 2   benzonatate  (TESSALON ) 100 MG capsule Take 1 capsule (  100 mg total) by mouth every 8 (eight) hours. 21 capsule 0   betamethasone  valerate ointment (VALISONE ) 0.1 % Apply 1 Application topically as needed.     CALCIUM  PO Take 1,200 mg by mouth daily.     Cholecalciferol 125 MCG (5000 UT) TABS Take 1 tablet by mouth daily.     clobetasol  (TEMOVATE ) 0.05 % external solution APPLY TO SCALP TWICE A DAY *AVOID FACE, GROIN, AND UNDERARMS*     desmopressin  (DDAVP ) 0.2 MG tablet Take 0.2 mg by mouth at bedtime.     diltiazem  (CARDIZEM  CD) 180 MG 24 hr capsule TAKE 1 CAPSULE BY MOUTH EVERY DAY 90 capsule 1   ELIQUIS  5 MG TABS tablet TAKE 1 TABLET BY MOUTH TWICE A DAY 180 tablet 1   EPINEPHrine  0.3 mg/0.3 mL IJ SOAJ injection Inject 0.3 mg into the muscle as needed for anaphylaxis.     fexofenadine (ALLEGRA) 180 MG tablet Take 180 mg by mouth daily.     furosemide  (LASIX ) 20 MG tablet Take 1 tablet (20 mg total) by mouth daily as needed for edema. 90  tablet 1   hydrALAZINE  (APRESOLINE ) 50 MG tablet Take 50 mg by mouth as needed.     hydrOXYzine  (ATARAX /VISTARIL ) 10 MG tablet Take 10 mg by mouth at bedtime.      ipratropium-albuterol  (DUONEB) 0.5-2.5 (3) MG/3ML SOLN Take 3 mLs by nebulization every 6 (six) hours as needed. 120 mL 0   ketoconazole (NIZORAL) 2 % cream Apply 1 Application topically 2 (two) times daily.     losartan  (COZAAR ) 100 MG tablet TAKE 1 TABLET BY MOUTH EVERY DAY 90 tablet 0   MAGNESIUM  PO Take 250 mg by mouth daily.     montelukast  (SINGULAIR ) 10 MG tablet Take 10 mg by mouth at bedtime.     Multiple Vitamin (MULTIVITAMIN PO) Take 1 tablet by mouth daily.     OVER THE COUNTER MEDICATION Juice Plus-daily     OVER THE COUNTER MEDICATION Nopolea 3 oz once a day     pantoprazole  (PROTONIX ) 40 MG tablet Take 1 tablet (40 mg total) by mouth daily. 90 tablet 3   prazosin  (MINIPRESS ) 5 MG capsule Take 1 capsule (5 mg total) by mouth 2 (two) times daily. 180 capsule 1   Probiotic Product (PROBIOTIC PO) Take 1 capsule by mouth daily.      Psyllium (METAMUCIL) 0.36 g CAPS Take 1 capsule by mouth daily.     vitamin B-12 (CYANOCOBALAMIN ) 100 MCG tablet Take 100 mcg by mouth daily.     No current facility-administered medications for this visit.    Allergies as of 09/23/2024 - Review Complete 09/23/2024  Allergen Reaction Noted   Celebrex [celecoxib] Diarrhea 03/14/2021   Cephalexin  06/17/2023   Hydrochlorothiazide   11/12/2019   Penicillin g benzathine  06/17/2023   Penicillins Hives and Swelling 02/27/2024   Phenobarbital Hives 06/17/2023   Statins  07/13/2021   Passion fruit flavoring agent (non-screening) Diarrhea and Rash 07/13/2021   Tape Rash 01/10/2015    Family History  Problem Relation Age of Onset   Pancreatic cancer Father 85   Diabetes Mother    Heart disease Mother    Cancer Brother 1       Bile duct   Diabetes Brother    Hypertension Brother    Diabetes Brother    Heart disease Brother     Hypertension Brother    Hypertension Sister    Hypertension Sister    Colon cancer Paternal Uncle  dx 8s   Lung cancer Paternal Uncle        dx 45s    Review of Systems:    Constitutional: No weight loss, fever, chills, weakness or fatigue HEENT: Eyes: No change in vision               Ears, Nose, Throat:  No change in hearing or congestion Skin: No rash or itching Cardiovascular: No chest pain, chest pressure or palpitations   Respiratory: No SOB or cough Gastrointestinal: See HPI and otherwise negative Genitourinary: No dysuria or change in urinary frequency Neurological: No headache, dizziness or syncope Musculoskeletal: No new muscle or joint pain Hematologic: No bleeding or bruising Psychiatric: No history of depression or anxiety    Physical Exam:  Vital signs: BP 112/64   Pulse 69   Ht 5' (1.524 m)   Wt 155 lb (70.3 kg)   SpO2 96%   BMI 30.27 kg/m   Constitutional:   Pleasant female appears to be in NAD, Well developed, Well nourished, alert and cooperative Eyes:   PEERL, EOMI. No icterus. Conjunctiva pink. Throat: Oral cavity and pharynx without inflammation, swelling or lesion.  Respiratory: Respirations even and unlabored. Lungs clear to auscultation bilaterally.   No wheezes, crackles, or rhonchi.  Cardiovascular: Normal S1, S2. Regular rate and rhythm. No peripheral edema, cyanosis or pallor.  Gastrointestinal:  Soft, nondistended, nontender. No rebound or guarding. Normal bowel sounds. No appreciable masses or hepatomegaly. Rectal:  Not performed.  Msk:  Symmetrical without gross deformities. Without edema, no deformity or joint abnormality.  Neurologic:  Alert and  oriented x4;  grossly normal neurologically.  Skin:   Dry and intact without significant lesions or rashes.  RELEVANT LABS AND IMAGING: CBC    Latest Ref Rng & Units 06/27/2024    9:00 AM 03/04/2024    2:36 AM 03/03/2024    3:35 AM  CBC  WBC 4.0 - 10.5 K/uL 3.8  10.0  6.9   Hemoglobin  12.0 - 15.0 g/dL 88.8  87.6  88.8   Hematocrit 36.0 - 46.0 % 32.3  36.6  34.3   Platelets 150 - 400 K/uL 202  232  197      CMP     Latest Ref Rng & Units 06/27/2024    9:00 AM 03/06/2024   10:55 AM 03/05/2024    2:59 AM  CMP  Glucose 70 - 99 mg/dL 93  899  879   BUN 8 - 23 mg/dL 18  17  19    Creatinine 0.44 - 1.00 mg/dL 9.31  9.20  9.07   Sodium 135 - 145 mmol/L 136  138  138   Potassium 3.5 - 5.1 mmol/L 3.8  4.2  4.3   Chloride 98 - 111 mmol/L 107  104  106   CO2 22 - 32 mmol/L 21  26  25    Calcium  8.9 - 10.3 mg/dL 9.1  9.1  8.8      Lab Results  Component Value Date   TSH 1.047 03/03/2024  03/05/2024 echo TEE-Left ventricular ejection fraction, by estimation, is 55 to 60%.  04/27/24 MCRP Abdomen  IMPRESSION: 1.Multiple T2 hyperintense cystic lesions throughout the pancreatic parenchyma some demonstrating communication with the main duct suggestive of side branch IPMNs. No enhancing solid component or interval growth identified. Recommend imaging follow-up in 6 months with MRCP to ensure stability.   2.Multiple bilateral simple renal cortical cysts which does not require imaging follow-up (Bosniak 1,2)   3.  Punctate splenic cyst.  06/23/24 ESOPHAGUS/BARIUM SWALLOW/TABLET STUDY  IMPRESSION: 1. Mildly patulous esophagus. 2. Moderate esophageal dysmotility with tertiary contractions. 3. Small to moderate-sized hiatal hernia. 4. Large-volume gastroesophageal reflux observed the level of the upper thoracic esophagus with provocative maneuvers. 5. No evidence of an esophageal stricture.  06/30/24 US  thyroid  IMPRESSION: Small mildly heterogeneous thyroid  gland containing small sonographically low risk/benign spongiform nodules.   No lesions identified that warrant biopsy or continued imaging surveillance.   Assessment: Encounter Diagnoses  Name Primary?   Gastroesophageal reflux disease, unspecified whether esophagitis present Yes   Pharyngoesophageal dysphagia     Hiatal hernia    Neck nodule        83 year old female patient that presents with pill dysphagia with history of GERD requiring dilatation s/p esophageal stricture in 2008. Patient had EGD in February 2024 for IDA that showed medium size hiatal hernia but no narrowing of esophagus.  Anemia, resolved. Barium swallow study: Mildly patulous esophagus.Moderate esophageal dysmotility with tertiary contractions. Small to moderate-sized hiatal hernia. Large-volume gastroesophageal reflux observed the level of the upper thoracic esophagus with provocative maneuvers. No evidence of an esophageal stricture.  Also palpated cyst on right side of neck. Ordered US  thyroid : Small mildly heterogeneous thyroid  gland containing small sonographically low risk/benign spongiform nodules. Patients symptoms have improved with switching PPI therapy. I suspect she Brianna Mccarty have some presbyesophagus as well. We discussed dietary modifications.  No further interventions at this time.     Patient has history of pancreatic cyst suggestive of sidebranch IPMN's.  Most recent imaging 04/2024 showed communication with the main duct therefore recommendations were made to proceed with imaging in 6 months to ensure stable ability due to family history. She has fup MRCP with Dr. Aron ([facility]) in December.      Patient up-to-date with colon screening colonoscopy with no repeat due to age.  Plan: - Continue pantoprazole  40 mg po daily  - Reinforce GERD diet, no late meals 3-4 hours before lying down  -follow-up 6 mths with me  Thank you for the courtesy of this consult. Please call me with any questions or concerns.   Lateshia Schmoker, FNP-C [facility] 09/23/2024, 2:36 PM  Cc: Ozell Heron HERO, MD  I have reviewed the clinic note as outlined by Cathryne Beal, NP and agree with the assessment, plan and medical decision making.  Ms. Rackers presents with symptoms of GERD and dysphagia predominantly to solids.  Had a history of  esophageal stricture in 2008 requiring dilation.  No stricture on last EGD 2024.  Barium esophagram showed mildly patulous esophagus, moderate esophageal dysmotility, moderate-sized hiatal hernia and large volume GERD to the upper thoracic esophagus.  Thyroid  ultrasound with small nodules.  She notes that her symptoms of reflux have improved after switching from omeprazole  to pantoprazole .  Agree with follow-up MRCP for IPMN monitoring.  Inocente Hausen, MD

## 2024-09-24 DIAGNOSIS — J3089 Other allergic rhinitis: Secondary | ICD-10-CM | POA: Diagnosis not present

## 2024-09-24 DIAGNOSIS — J301 Allergic rhinitis due to pollen: Secondary | ICD-10-CM | POA: Diagnosis not present

## 2024-09-24 DIAGNOSIS — J3081 Allergic rhinitis due to animal (cat) (dog) hair and dander: Secondary | ICD-10-CM | POA: Diagnosis not present

## 2024-09-28 ENCOUNTER — Other Ambulatory Visit: Payer: Self-pay

## 2024-09-28 ENCOUNTER — Emergency Department (HOSPITAL_COMMUNITY)
Admission: EM | Admit: 2024-09-28 | Discharge: 2024-09-28 | Disposition: A | Discharge status: Home / Self Care | Attending: Emergency Medicine | Admitting: Emergency Medicine

## 2024-09-28 ENCOUNTER — Emergency Department (HOSPITAL_COMMUNITY)

## 2024-09-28 ENCOUNTER — Encounter (HOSPITAL_COMMUNITY): Payer: Self-pay | Admitting: *Deleted

## 2024-09-28 DIAGNOSIS — I350 Nonrheumatic aortic (valve) stenosis: Secondary | ICD-10-CM | POA: Insufficient documentation

## 2024-09-28 DIAGNOSIS — I4891 Unspecified atrial fibrillation: Secondary | ICD-10-CM | POA: Insufficient documentation

## 2024-09-28 DIAGNOSIS — I700 Atherosclerosis of aorta: Secondary | ICD-10-CM | POA: Diagnosis not present

## 2024-09-28 DIAGNOSIS — R002 Palpitations: Secondary | ICD-10-CM | POA: Diagnosis present

## 2024-09-28 DIAGNOSIS — Z79899 Other long term (current) drug therapy: Secondary | ICD-10-CM | POA: Diagnosis not present

## 2024-09-28 DIAGNOSIS — R0789 Other chest pain: Secondary | ICD-10-CM | POA: Insufficient documentation

## 2024-09-28 DIAGNOSIS — I119 Hypertensive heart disease without heart failure: Secondary | ICD-10-CM | POA: Diagnosis not present

## 2024-09-28 DIAGNOSIS — I10 Essential (primary) hypertension: Secondary | ICD-10-CM | POA: Diagnosis not present

## 2024-09-28 DIAGNOSIS — R7989 Other specified abnormal findings of blood chemistry: Secondary | ICD-10-CM | POA: Diagnosis not present

## 2024-09-28 DIAGNOSIS — R000 Tachycardia, unspecified: Secondary | ICD-10-CM | POA: Diagnosis not present

## 2024-09-28 DIAGNOSIS — Z7901 Long term (current) use of anticoagulants: Secondary | ICD-10-CM | POA: Diagnosis not present

## 2024-09-28 DIAGNOSIS — I2510 Atherosclerotic heart disease of native coronary artery without angina pectoris: Secondary | ICD-10-CM | POA: Insufficient documentation

## 2024-09-28 DIAGNOSIS — I444 Left anterior fascicular block: Secondary | ICD-10-CM | POA: Insufficient documentation

## 2024-09-28 DIAGNOSIS — E876 Hypokalemia: Secondary | ICD-10-CM | POA: Diagnosis not present

## 2024-09-28 DIAGNOSIS — G4733 Obstructive sleep apnea (adult) (pediatric): Secondary | ICD-10-CM | POA: Diagnosis not present

## 2024-09-28 DIAGNOSIS — I517 Cardiomegaly: Secondary | ICD-10-CM | POA: Diagnosis not present

## 2024-09-28 LAB — CBC WITH DIFFERENTIAL/PLATELET
Abs Immature Granulocytes: 0.01 K/uL (ref 0.00–0.07)
Basophils Absolute: 0 K/uL (ref 0.0–0.1)
Basophils Relative: 1 %
Eosinophils Absolute: 0.1 K/uL (ref 0.0–0.5)
Eosinophils Relative: 3 %
HCT: 39 % (ref 36.0–46.0)
Hemoglobin: 13 g/dL (ref 12.0–15.0)
Immature Granulocytes: 0 %
Lymphocytes Relative: 23 %
Lymphs Abs: 1.1 K/uL (ref 0.7–4.0)
MCH: 30.1 pg (ref 26.0–34.0)
MCHC: 33.3 g/dL (ref 30.0–36.0)
MCV: 90.3 fL (ref 80.0–100.0)
Monocytes Absolute: 0.6 K/uL (ref 0.1–1.0)
Monocytes Relative: 12 %
Neutro Abs: 2.9 K/uL (ref 1.7–7.7)
Neutrophils Relative %: 61 %
Platelets: 186 K/uL (ref 150–400)
RBC: 4.32 MIL/uL (ref 3.87–5.11)
RDW: 13 % (ref 11.5–15.5)
WBC: 4.8 K/uL (ref 4.0–10.5)
nRBC: 0 % (ref 0.0–0.2)

## 2024-09-28 LAB — COMPREHENSIVE METABOLIC PANEL WITH GFR
ALT: 14 U/L (ref 0–44)
AST: 24 U/L (ref 15–41)
Albumin: 4.2 g/dL (ref 3.5–5.0)
Alkaline Phosphatase: 49 U/L (ref 38–126)
Anion gap: 13 (ref 5–15)
BUN: 16 mg/dL (ref 8–23)
CO2: 21 mmol/L — ABNORMAL LOW (ref 22–32)
Calcium: 9.4 mg/dL (ref 8.9–10.3)
Chloride: 101 mmol/L (ref 98–111)
Creatinine, Ser: 0.7 mg/dL (ref 0.44–1.00)
GFR, Estimated: >60 mL/min (ref >60)
Glucose, Bld: 93 mg/dL (ref 70–99)
Potassium: 3.4 mmol/L — ABNORMAL LOW (ref 3.5–5.1)
Sodium: 135 mmol/L (ref 135–145)
Total Bilirubin: 0.9 mg/dL (ref 0.0–1.2)
Total Protein: 7.2 g/dL (ref 6.5–8.1)

## 2024-09-28 LAB — MAGNESIUM: Magnesium: 1.5 mg/dL — ABNORMAL LOW (ref 1.7–2.4)

## 2024-09-28 LAB — TROPONIN I (HIGH SENSITIVITY): Troponin I (High Sensitivity): 6 ng/L (ref <18)

## 2024-09-28 LAB — BRAIN NATRIURETIC PEPTIDE: B Natriuretic Peptide: 275.3 pg/mL — ABNORMAL HIGH (ref 0.0–100.0)

## 2024-09-28 MED ORDER — POTASSIUM CHLORIDE CRYS ER 20 MEQ PO TBCR
20.0000 meq | EXTENDED_RELEASE_TABLET | Freq: Once | ORAL | Status: AC
  Administered 2024-09-28: 20 meq via ORAL
  Filled 2024-09-28: qty 1

## 2024-09-28 MED ORDER — MAGNESIUM SULFATE 2 GM/50ML IV SOLN
2.0000 g | Freq: Once | INTRAVENOUS | Status: AC
  Administered 2024-09-28: 2 g via INTRAVENOUS
  Filled 2024-09-28: qty 50

## 2024-09-28 MED ORDER — MAGNESIUM OXIDE -MG SUPPLEMENT 400 (240 MG) MG PO TABS
400.0000 mg | ORAL_TABLET | Freq: Once | ORAL | Status: AC
  Administered 2024-09-28: 400 mg via ORAL
  Filled 2024-09-28: qty 1

## 2024-09-28 MED ORDER — DILTIAZEM HCL 25 MG/5ML IV SOLN
20.0000 mg | Freq: Once | INTRAVENOUS | Status: AC
  Administered 2024-09-28: 20 mg via INTRAVENOUS
  Filled 2024-09-28: qty 5

## 2024-09-28 NOTE — ED Provider Notes (Signed)
 [place] [facility] AT [facility] Provider Note   CSN: [id number] Arrival date & time: 09/28/24  9044     Patient presents with: Irregular Heart Beat   Brianna Mccarty is a 83 y.o. female.   HPI    83 year old female with medical history significant for HTN, CAD, HLD, GERD, mild aortic stenosis, OSA, atrial fibrillation on Eliquis  who presents emergency department sudden onset palpitations and chest tightness that came on this morning.  She feels that it is similar to prior episodes of atrial fibrillation.  She denies missing any doses of her home Eliquis .  She is compliant with Cardizem .  She endorses continued heart palpitations, some prostatitis since, denies shortness of breath, lightheadedness.  She denies any fevers or recent illness.   Prior to Admission medications   Medication Sig Start Date End Date Taking? Authorizing Provider  acyclovir  (ZOVIRAX ) 400 MG tablet Take 1 tablet (400 mg total) by mouth 4 (four) times daily. As needed for outbreak. 07/14/24   Ozell Heron HERO, MD  ADVAIR [facility] 115-21 MCG/ACT inhaler Inhale 2 puffs into the lungs 2 (two) times daily.    [provider]  albuterol  (PROAIR  HFA) 108 (90 Base) MCG/ACT inhaler Inhale 2 puffs into the lungs every 6 (six) hours as needed for wheezing. 09/10/18   Christi Glendia HERO, MD  albuterol  (PROVENTIL ) (2.5 MG/3ML) 0.083% nebulizer solution Take 2.5 mg by nebulization. Every 4-6 hours    [provider]  alendronate  (FOSAMAX ) 70 MG tablet TAKE 1 TABLET EVERY 7 (SEVEN) DAYS. TAKE WITH A FULL GLASS OF WATER  ON AN EMPTY STOMACH. 11/11/23   Ozell Heron HERO, MD  Ascorbic Acid (VITAMIN C) 1000 MG tablet Take 1,000 mg by mouth daily.    [provider]  azelastine  (ASTELIN ) 0.1 % nasal spray Instill 2 sprays into each nostril two times a day as needed for allergies 02/03/21   Arloa Suzen RAMAN, NP  Bempedoic Acid -Ezetimibe  (NEXLIZET ) 180-10 MG TABS Take 1 tablet by mouth daily.  10/13/23   Lucien Orren SAILOR, PA-C  benzonatate  (TESSALON ) 100 MG capsule Take 1 capsule (100 mg total) by mouth every 8 (eight) hours. 06/27/24   Neysa Caron PARAS, DO  betamethasone  valerate ointment (VALISONE ) 0.1 % Apply 1 Application topically as needed.    [provider]  CALCIUM  PO Take 1,200 mg by mouth daily.    [provider]  Cholecalciferol 125 MCG (5000 UT) TABS Take 1 tablet by mouth daily.    [provider]  clobetasol  (TEMOVATE ) 0.05 % external solution APPLY TO SCALP TWICE A DAY *AVOID FACE, GROIN, AND UNDERARMS*    [provider]  desmopressin  (DDAVP ) 0.2 MG tablet Take 0.2 mg by mouth at bedtime.    [provider]  diltiazem  (CARDIZEM  CD) 180 MG 24 hr capsule TAKE 1 CAPSULE BY MOUTH EVERY DAY 08/04/24   Ozell Heron HERO, MD  ELIQUIS  5 MG TABS tablet TAKE 1 TABLET BY MOUTH TWICE A DAY 08/03/24   Ozell Heron HERO, MD  EPINEPHrine  0.3 mg/0.3 mL IJ SOAJ injection Inject 0.3 mg into the muscle as needed for anaphylaxis. 12/21/18   [provider]  fexofenadine (ALLEGRA) 180 MG tablet Take 180 mg by mouth daily.    [provider]  furosemide  (LASIX ) 20 MG tablet Take 1 tablet (20 mg total) by mouth daily as needed for edema. 11/11/23   Ozell Heron HERO, MD  hydrALAZINE  (APRESOLINE ) 50 MG tablet Take 50 mg by mouth as needed.  [provider]  hydrOXYzine  (ATARAX /VISTARIL ) 10 MG tablet Take 10 mg by mouth at bedtime.     [provider]  ipratropium-albuterol  (DUONEB) 0.5-2.5 (3) MG/3ML SOLN Take 3 mLs by nebulization every 6 (six) hours as needed. 02/27/24   Piontek, Rocky, MD  ketoconazole (NIZORAL) 2 % cream Apply 1 Application topically 2 (two) times daily.    [provider]  losartan  (COZAAR ) 100 MG tablet TAKE 1 TABLET BY MOUTH EVERY DAY 08/03/24   Ozell Heron HERO, MD  MAGNESIUM  PO Take 250 mg by mouth daily.    [provider]  montelukast  (SINGULAIR ) 10 MG tablet Take 10 mg by  mouth at bedtime.    [provider]  Multiple Vitamin (MULTIVITAMIN PO) Take 1 tablet by mouth daily.    [provider]  OVER THE COUNTER MEDICATION Juice Plus-daily    [provider]  OVER THE COUNTER MEDICATION Nopolea 3 oz once a day    [provider]  pantoprazole  (PROTONIX ) 40 MG tablet Take 1 tablet (40 mg total) by mouth daily. 06/07/24   May, Deanna J, NP  prazosin  (MINIPRESS ) 5 MG capsule Take 1 capsule (5 mg total) by mouth 2 (two) times daily. 03/23/24   Ozell Heron HERO, MD  Probiotic Product (PROBIOTIC PO) Take 1 capsule by mouth daily.     [provider]  Psyllium (METAMUCIL) 0.36 g CAPS Take 1 capsule by mouth daily.    [provider]  vitamin B-12 (CYANOCOBALAMIN ) 100 MCG tablet Take 100 mcg by mouth daily.    [provider]    Allergies: Celebrex [celecoxib], Cephalexin, Hydrochlorothiazide , Penicillin g benzathine, Penicillins, Phenobarbital, Statins, Passion fruit flavoring agent (non-screening), and Tape    Review of Systems  All other systems reviewed and are negative.   Updated Vital Signs BP 95/60   Pulse 63   Temp 98.6 F (37 C) (Oral)   Resp 17   Wt 70.3 kg   SpO2 97%   BMI 30.27 kg/m   Physical Exam Vitals and nursing note reviewed.  Constitutional:      General: She is not in acute distress.    Appearance: She is well-developed.  HENT:     Head: Normocephalic and atraumatic.  Eyes:     Conjunctiva/sclera: Conjunctivae normal.  Cardiovascular:     Rate and Rhythm: Tachycardia present. Rhythm irregular.  Pulmonary:     Effort: Pulmonary effort is normal. No respiratory distress.     Breath sounds: Normal breath sounds.  Abdominal:     Palpations: Abdomen is soft.     Tenderness: There is no abdominal tenderness.  Musculoskeletal:        General: No swelling.     Cervical back: Neck supple.  Skin:    General: Skin is warm and dry.     Capillary Refill: Capillary refill takes  less than 2 seconds.  Neurological:     Mental Status: She is alert.  Psychiatric:        Mood and Affect: Mood normal.     (all labs ordered are listed, but only abnormal results are displayed) Labs Reviewed  COMPREHENSIVE METABOLIC PANEL WITH GFR - Abnormal; Notable for the following components:      Result Value   Potassium 3.4 (*)    CO2 21 (*)    All other components within normal limits  BRAIN NATRIURETIC PEPTIDE - Abnormal; Notable for the following components:   B Natriuretic Peptide 275.3 (*)    All other components within  normal limits  MAGNESIUM  - Abnormal; Notable for the following components:   Magnesium  1.5 (*)    All other components within normal limits  CBC WITH DIFFERENTIAL/PLATELET  TROPONIN I (HIGH SENSITIVITY)    EKG: EKG Interpretation Date/Time:  Tuesday September 28 2024 10:04:26 EST Ventricular Rate:  140 PR Interval:    QRS Duration:  100 QT Interval:  325 QTC Calculation: 484 R Axis:   -85  Text Interpretation: Atrial fibrillation with rapid ventricular response Left anterior fascicular block Consider anterior infarct ST depression, probably rate related Confirmed by Jerrol Agent ([id number]) on 09/28/2024 10:27:28 AM  Radiology: DG Chest Portable 1 View Result Date: 09/28/2024 EXAM: 1 VIEW(S) XRAY OF THE CHEST 09/28/2024 11:02:00 AM COMPARISON: 06/27/2024 CLINICAL HISTORY: AFIB, CHEST TIGHTNESS FINDINGS: LUNGS AND PLEURA: Stable nodular density in right lower lung, likely calcified pulmonary nodule. No pleural effusion. No pneumothorax. HEART AND MEDIASTINUM: Cardiomegaly. Aortic atherosclerosis. BONES AND SOFT TISSUES: No acute osseous abnormality. IMPRESSION: 1. Cardiomegaly. Electronically signed by: Norleen Boxer MD 09/28/2024 11:33 AM EST RP Workstation: [id number]     Procedures   Medications Ordered in the ED  magnesium  sulfate IVPB 2 g 50 mL (2 g Intravenous New Bag/Given 09/28/24 1131)  potassium chloride  SA (KLOR-CON  M) CR tablet 20 mEq  (has no administration in time range)  diltiazem  (CARDIZEM ) injection 20 mg (20 mg Intravenous Given 09/28/24 1026)  magnesium  oxide (MAG-OX) tablet 400 mg (400 mg Oral Given 09/28/24 1131)                                    Medical Decision Making Amount and/or Complexity of Data Reviewed Labs: ordered. Radiology: ordered.  Risk OTC drugs. Prescription drug management.   83 year old female with medical history significant for HTN, CAD, HLD, GERD, mild aortic stenosis, OSA, atrial fibrillation on Eliquis  who presents emergency department sudden onset palpitations and chest tightness that came on this morning.  She feels that it is similar to prior episodes of atrial fibrillation.  She denies missing any doses of her home Eliquis .  She is compliant with Cardizem .  She endorses continued heart palpitations, some prostatitis since, denies shortness of breath, lightheadedness.  She denies any fevers or recent illness.  On arrival, the patient was afebrile, tachycardic heart rate in the 140s, irregularly irregular rhythm noted on cardiac telemetry and on exam.  On EKG, the patient was confirmed to have atrial fibrillation with RVR ventricular rate 140, likely rate related ST depressions noted, no STEMI.  Patient presenting with acute onset symptomatic atrial fibrillation with rapid ventricular response.  IV access was obtained the patient was administered IV diltiazem  20 mg with improvement in heart rate to the 80s and 90s.  A chest x-ray was performed which revealed no acute cardiac or pulmonary disease.  Laboratory evaluation revealed hypomagnesemia to 1.5 which was replenished orally and IV, mild hypokalemia to 3.4 which was also replenished orally.  Cardiac troponin was normal and his CBC was low without a leukocytosis or anemia, BNP was nonspecifically moderately elevated to 75.  On repeat evaluation, following initial IV diltiazem  and magnesium  supplementation, the patient had  spontaneously converted to normal sinus rhythm.  She remains asymptomatic at this time.  Discussed continued outpatient management of her atrial fibrillation with continued oral Cardizem  and anticoagulation and advised outpatient follow-up with her PCP and cardiology.     Final diagnoses:  Atrial fibrillation with RVR (HCC)  Hypomagnesemia  Hypokalemia  ED Discharge Orders     None          Jerrol Agent, MD 09/28/24 1206

## 2024-09-28 NOTE — ED Notes (Signed)
 Pt ambulated to bathroom and back to room with standby assist. Pt tolerated well.

## 2024-09-28 NOTE — ED Triage Notes (Signed)
 BIB [facility] from home for fast HR. H/o afib. Onset this am while drinking coffee. Takes eliquis  and cardizem . Denies missing doses. Has taken today's meds. Denies pain or other sx. Endorses can feel heart race, some pressure tightness. Denies sob, NV, HA, dizziness, fever, or recent illness. Denies ETOH. H/o cardioversion. States, always in afib, but not usually this fast. EDP present on arrival. Family arriving to [facility]. PT alert, NAD, calm, interactive, resps e/u, skin W&D.

## 2024-09-28 NOTE — Discharge Instructions (Addendum)
 Please follow-up with your primary care provider and cardiology.  You were in atrial fibrillation with rapid ventricular response but this resolved after IV medication in the emergency department.  Your potassium was mildly low and was replenished orally and your magnesium  was also low and was replenished orally and IV.

## 2024-09-29 ENCOUNTER — Ambulatory Visit (HOSPITAL_COMMUNITY)
Admission: RE | Admit: 2024-09-29 | Discharge: 2024-09-29 | Disposition: A | Discharge status: Home / Self Care | Source: Ambulatory Visit | Attending: Physician Assistant | Admitting: Physician Assistant

## 2024-09-29 VITALS — BP 132/64 | HR 59 | Ht 60.0 in | Wt 159.8 lb

## 2024-09-29 DIAGNOSIS — D6869 Other thrombophilia: Secondary | ICD-10-CM | POA: Diagnosis not present

## 2024-09-29 DIAGNOSIS — I4891 Unspecified atrial fibrillation: Secondary | ICD-10-CM | POA: Diagnosis not present

## 2024-09-29 DIAGNOSIS — I480 Paroxysmal atrial fibrillation: Secondary | ICD-10-CM | POA: Diagnosis not present

## 2024-09-29 MED ORDER — DILTIAZEM HCL 30 MG PO TABS
ORAL_TABLET | ORAL | 1 refills | Status: AC
Start: 2024-09-29 — End: ?

## 2024-09-29 MED ORDER — POTASSIUM CHLORIDE CRYS ER 10 MEQ PO TBCR
10.0000 meq | EXTENDED_RELEASE_TABLET | Freq: Every day | ORAL | 3 refills | Status: AC
Start: 2024-09-29 — End: ?

## 2024-09-29 MED ORDER — MAGNESIUM OXIDE 400 MG PO TABS: 400.0000 mg | ORAL_TABLET | Freq: Every day | ORAL | 3 refills | Status: AC

## 2024-09-29 NOTE — Patient Instructions (Addendum)
 Start Magnesium  oxide 400mg  once a day  Start Potassium 10meq once a day   Cardizem  30mg  -- Take 1 tablet every 4 hours AS NEEDED for AFIB heart rate >100 as long as top BP >100.      Have labs drawn at any [facility] (orders attached) the week of December 15th

## 2024-09-29 NOTE — Progress Notes (Signed)
 Primary Care Physician: Ozell Heron HERO, MD Primary Cardiologist: Soyla DELENA Merck, MD Electrophysiologist: None  Referring Physician: ED   Brianna Mccarty is a 83 y.o. female with a history of asthma, VHD, HTN, OSA, aortic atherosclerosis, atrial fibrillation who presents for follow up in the [facility].  She was initially diagnosed with afib 03/03/24 and underwent DCCV on 03/05/24. The patient was seen at the ED 09/28/24 with chest pain and tachypalpitations. ECG showed afib with RVR. She was started in IV diltiazem  and potassium and magnesium  were repleted. She spontaneously converted to SR. Patient is on Eliquis  for stroke prevention.    Patient presents today for follow up for atrial fibrillation. She is in SR today and feels well. She drinks alcohol very infrequently and uses an oral appliance for her OSA. No bleeding issues on anticoagulation.   Today, she denies symptoms of palpitations, chest pain, shortness of breath, orthopnea, PND, lower extremity edema, dizziness, presyncope, syncope, bleeding, or neurologic sequela. The patient is tolerating medications without difficulties and is otherwise without complaint today.    Atrial Fibrillation Risk Factors:  she does have symptoms or diagnosis of sleep apnea. she does not have a history of rheumatic fever. she does not have a history of alcohol use. The patient does have a history of early familial atrial fibrillation or other arrhythmias. Brother afib.   Atrial Fibrillation Management history:  Previous antiarrhythmic drugs: none Previous cardioversions: 03/05/24 Previous ablations: none Anticoagulation history: Eliquis   ROS- All systems are reviewed and negative except as per the HPI above.  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 mild by carotid duplex.   Cervical dysplasia 1971   Coronary artery disease    Diverticulosis of colon    DJD (degenerative joint disease)    Family history of cancer of extrahepatic bile ducts 09/05/2020   Family history of colon cancer 09/05/2020   Family history of pancreatic cancer 09/05/2020   GERD (gastroesophageal reflux disease)    Headache(784.0)    History of kidney stones    Hypercholesterolemia    Hypertension    Interstitial cystitis    sees urologist   Lichen sclerosus    Malignant melanoma (HCC) 2006   sees Dr. Shona in dermatology   Migraines    Mild aortic stenosis    Mitral valve disease    Question mitral valve prolapse in the past, no prolapse by echo 2009   Murmur 10/20/2015   SEES DR NELSON   OSA (obstructive sleep apnea)    USES DENTAL DEVICE   Osteoporosis    on fosomax > 5 years, stopped 11/2015   Pneumonia    SVT (supraventricular tachycardia) 02/15/2022   Monitor 02/2022: NSR, avg HR 61; 2 runs of NSVT (4 beats); several runs of Supraventricular Tachycardia (longest 311'); no AFib   Thyroid  cyst    1 x 1.1 thyroid  cyst noted on carotid Doppler, January, 2012   Venous insufficiency     Current Outpatient Medications  Medication Sig Dispense Refill   acyclovir  (ZOVIRAX ) 400 MG tablet Take 1 tablet (400 mg total) by mouth 4 (four) times daily. As needed for outbreak. (Patient taking differently:  Take 400 mg by mouth as needed. As needed for outbreak.) 30 tablet 2   ADVAIR HFA 115-21 MCG/ACT inhaler Inhale 2 puffs into the lungs 2 (two) times daily.     albuterol  (PROAIR  HFA) 108 (90 Base) MCG/ACT inhaler Inhale 2 puffs into the lungs every 6 (six) hours as needed for wheezing. 3 Inhaler 3   albuterol  (PROVENTIL ) (2.5 MG/3ML) 0.083% nebulizer solution Take 2.5 mg by nebulization. Every 4-6 hours     alendronate  (FOSAMAX ) 70 MG tablet TAKE 1 TABLET EVERY 7  (SEVEN) DAYS. TAKE WITH A FULL GLASS OF WATER  ON AN EMPTY STOMACH. 12 tablet 3   Ascorbic Acid (VITAMIN C) 1000 MG tablet Take 1,000 mg by mouth daily.     azelastine  (ASTELIN ) 0.1 % nasal spray Instill 2 sprays into each nostril two times a day as needed for allergies 30 mL 0   Bempedoic Acid -Ezetimibe  (NEXLIZET ) 180-10 MG TABS Take 1 tablet by mouth daily. 90 tablet 2   benzonatate  (TESSALON ) 100 MG capsule Take 1 capsule (100 mg total) by mouth every 8 (eight) hours. 21 capsule 0   betamethasone  valerate ointment (VALISONE ) 0.1 % Apply 1 Application topically as needed.     CALCIUM  PO Take 1,200 mg by mouth daily.     Cholecalciferol 125 MCG (5000 UT) TABS Take 1 tablet by mouth daily.     clobetasol  (TEMOVATE ) 0.05 % external solution APPLY TO SCALP TWICE A DAY *AVOID FACE, GROIN, AND UNDERARMS*     desmopressin  (DDAVP ) 0.2 MG tablet Take 0.2 mg by mouth at bedtime.     diltiazem  (CARDIZEM  CD) 180 MG 24 hr capsule TAKE 1 CAPSULE BY MOUTH EVERY DAY 90 capsule 1   diltiazem  (CARDIZEM ) 30 MG tablet Take 1 tablet every 4 hours AS NEEDED for AFIB heart rate >100 as long as top BP >100. 45 tablet 1   ELIQUIS  5 MG TABS tablet TAKE 1 TABLET BY MOUTH TWICE A DAY 180 tablet 1   EPINEPHrine  0.3 mg/0.3 mL IJ SOAJ injection Inject 0.3 mg into the muscle as needed for anaphylaxis.     fexofenadine (ALLEGRA) 180 MG tablet Take 180 mg by mouth daily.     furosemide  (LASIX ) 20 MG tablet Take 1 tablet (20 mg total) by mouth daily as needed for edema. (Patient taking differently: Take 20 mg by mouth daily in the afternoon.) 90 tablet 1   hydrALAZINE  (APRESOLINE ) 50 MG tablet Take 50 mg by mouth as needed.     ipratropium-albuterol  (DUONEB) 0.5-2.5 (3) MG/3ML SOLN Take 3 mLs by nebulization every 6 (six) hours as needed. 120 mL 0   ketoconazole (NIZORAL) 2 % cream Apply 1 Application topically 2 (two) times daily.     losartan  (COZAAR ) 100 MG tablet TAKE 1 TABLET BY MOUTH EVERY DAY 90 tablet 0   magnesium  oxide  (MAG-OX) 400 MG tablet Take 1 tablet (400 mg total) by mouth daily. 30 tablet 3   MAGNESIUM  PO Take 250 mg by mouth daily.     montelukast  (SINGULAIR ) 10 MG tablet Take 10 mg by mouth at bedtime.     Multiple Vitamin (MULTIVITAMIN PO) Take 1 tablet by mouth daily.     OVER THE COUNTER MEDICATION Juice Plus-daily- Taking 3 capsules daily     OVER THE COUNTER MEDICATION Nopolea 3 oz once a day     pantoprazole  (PROTONIX ) 40 MG tablet Take 1 tablet (40 mg total) by mouth daily. 90 tablet 3   potassium chloride  (KLOR-CON  M) 10 MEQ tablet Take  1 tablet (10 mEq total) by mouth daily. 30 tablet 3   prazosin  (MINIPRESS ) 5 MG capsule Take 1 capsule (5 mg total) by mouth 2 (two) times daily. 180 capsule 1   Probiotic Product (PROBIOTIC PO) Take 1 capsule by mouth daily.      Psyllium (METAMUCIL) 0.36 g CAPS Take 1 capsule by mouth daily.     vitamin B-12 (CYANOCOBALAMIN ) 100 MCG tablet Take 100 mcg by mouth daily.     No current facility-administered medications for this encounter.    Physical Exam: BP 132/64   Pulse (!) 59   Ht 5' (1.524 m)   Wt 72.5 kg   BMI 31.21 kg/m   GEN: Well nourished, well developed in no acute distress CARDIAC: Regular rate and rhythm, no rubs, gallops. 2/6 systolic murmur  RESPIRATORY:  Clear to auscultation without rales, wheezing or rhonchi  ABDOMEN: Soft, non-tender, non-distended EXTREMITIES:  No edema; No deformity   Wt Readings from Last 3 Encounters:  09/29/24 72.5 kg  09/28/24 70.3 kg  09/23/24 70.3 kg     EKG Interpretation Date/Time:  Wednesday September 29 2024 09:03:07 EST Ventricular Rate:  59 PR Interval:  148 QRS Duration:  94 QT Interval:  462 QTC Calculation: 457 R Axis:   177  Text Interpretation: Sinus bradycardia with occasional Premature ventricular complexes and Premature atrial complexes Right axis deviation Low voltage QRS Cannot rule out Anterior infarct , age undetermined Abnormal ECG When compared with ECG of 28-Sep-2024 10:28,  Sinus rhythm has replaced Atrial fibrillation Confirmed by Synda Bagent ([id number]) on 09/29/2024 9:16:54 AM    Echo 12/15/23 demonstrated   1. Left ventricular ejection fraction, by estimation, is 60 to 65%. The  left ventricle has normal function. The left ventricle has no regional  wall motion abnormalities. Left ventricular diastolic parameters are  indeterminate. The average left ventricular global longitudinal strain is -18.7 %. The global longitudinal strain is normal.   2. Right ventricular systolic function is normal. The right ventricular  size is normal. There is normal pulmonary artery systolic pressure.   3. Left atrial size was severely dilated.   4. The mitral valve is degenerative. Moderate mitral valve regurgitation.  No evidence of mitral stenosis. Severe mitral annular calcification.   5. Gradients similar to TTE done 07/17/23 . The aortic valve is tricuspid.  There is moderate calcification of the aortic valve. There is moderate  thickening of the aortic valve. Aortic valve regurgitation is not  visualized. Moderate aortic valve  stenosis.   6. Aortic dilatation noted. There is mild dilatation of the ascending  aorta, measuring 38 mm.   7. The inferior vena cava is normal in size with greater than 50%  respiratory variability, suggesting right atrial pressure of 3 mmHg.    CHA2DS2-VASc Score = 5  The patient's score is based upon: CHF History: 0 HTN History: 1 Diabetes History: 0 Stroke History: 0 Vascular Disease History: 1 (aortic atherosclerosis) Age Score: 2 Gender Score: 1       ASSESSMENT AND PLAN: Paroxysmal Atrial Fibrillation (ICD10:  I48.0) The patient's CHA2DS2-VASc score is 5, indicating a 7.2% annual risk of stroke.   Patient in SR today. She has had two ED visits this year for rapid afib. We discussed rhythm control options today including AAD (flecainide, Multaq, dofetilide, amiodarone) or ablation. For now, will start diltiazem  30 mg PRN q 4 hours  for heart racing. If she has more frequent afib, she would consider starting AAD or consultation for ablation.  Continue diltiazem  180 mg daily Continue Eliquis  5 mg BID Will start magnesium  oxide 400 mg daily and KCL 10 meq daily and recheck in 2-3 weeks.  Secondary Hypercoagulable State (ICD10:  D68.69) The patient is at significant risk for stroke/thromboembolism based upon her CHA2DS2-VASc Score of 5.  Continue Apixaban  (Eliquis ). No bleeding issues.  HTN Stable on current regimen  OSA  Encouraged nightly use of oral appliance.   VHD Moderate AS Moderate MR Followed by Dr Loni   Follow up in the AF clinic in 3 months.     [facility] [address] [place], [place] [address] [phone]

## 2024-10-04 DIAGNOSIS — I4891 Unspecified atrial fibrillation: Secondary | ICD-10-CM | POA: Diagnosis not present

## 2024-10-05 ENCOUNTER — Ambulatory Visit (HOSPITAL_COMMUNITY): Payer: Self-pay | Admitting: Physician Assistant

## 2024-10-05 DIAGNOSIS — J3081 Allergic rhinitis due to animal (cat) (dog) hair and dander: Secondary | ICD-10-CM | POA: Diagnosis not present

## 2024-10-05 DIAGNOSIS — J301 Allergic rhinitis due to pollen: Secondary | ICD-10-CM | POA: Diagnosis not present

## 2024-10-05 DIAGNOSIS — J3089 Other allergic rhinitis: Secondary | ICD-10-CM | POA: Diagnosis not present

## 2024-10-05 LAB — BASIC METABOLIC PANEL WITH GFR
BUN/Creatinine Ratio: 27 (ref 12–28)
BUN: 20 mg/dL (ref 8–27)
CO2: 21 mmol/L (ref 20–29)
Calcium: 9.2 mg/dL (ref 8.7–10.3)
Chloride: 101 mmol/L (ref 96–106)
Creatinine, Ser: 0.75 mg/dL (ref 0.57–1.00)
Glucose: 90 mg/dL (ref 70–99)
Potassium: 4.4 mmol/L (ref 3.5–5.2)
Sodium: 136 mmol/L (ref 134–144)
eGFR: 78 mL/min/1.73 (ref >59)

## 2024-10-06 ENCOUNTER — Telehealth: Payer: Self-pay | Admitting: Cardiology

## 2024-10-06 NOTE — Telephone Encounter (Signed)
 Left message to check on patient. If interested in discussing AAD - will bring in for sooner appt per ricky.

## 2024-10-06 NOTE — Telephone Encounter (Signed)
 Receive outpatient call from patient with episodes of recurrent atrial fibrillation. States she woke up around 4am with elevated HRs in the 160 range. BP 153/97. She took a dose of Dilt 30mg  x1 and rates improved shortly thereafter into the 60s. Woke up again just now and HR in the 110-115 range. I advised ok to take her daily diltiazem  180mg  now and follow up on HR. Instructed I would hold on aggressively treating HR in the 100 range this morning unless she is symptomatic. Will route to Afib clinic as she would like to revisit options for afib management. She remains compliant with her Eliquis .

## 2024-10-06 NOTE — Telephone Encounter (Signed)
 Patient feels she is finally back in NSR now - HR 73 BP 136/66 currently. Just feels washed out but otherwise ok. She would prefer to discuss AAD - appt made.

## 2024-10-07 DIAGNOSIS — M5416 Radiculopathy, lumbar region: Secondary | ICD-10-CM | POA: Diagnosis not present

## 2024-10-07 DIAGNOSIS — M48061 Spinal stenosis, lumbar region without neurogenic claudication: Secondary | ICD-10-CM | POA: Diagnosis not present

## 2024-10-07 DIAGNOSIS — M542 Cervicalgia: Secondary | ICD-10-CM | POA: Diagnosis not present

## 2024-10-07 DIAGNOSIS — Z6829 Body mass index (BMI) 29.0-29.9, adult: Secondary | ICD-10-CM | POA: Diagnosis not present

## 2024-10-13 ENCOUNTER — Ambulatory Visit (INDEPENDENT_AMBULATORY_CARE_PROVIDER_SITE_OTHER): Admitting: Otolaryngology

## 2024-10-13 ENCOUNTER — Encounter (INDEPENDENT_AMBULATORY_CARE_PROVIDER_SITE_OTHER): Payer: Self-pay | Admitting: Otolaryngology

## 2024-10-13 VITALS — BP 130/68 | HR 62 | Ht 60.25 in | Wt 156.0 lb

## 2024-10-13 DIAGNOSIS — H6123 Impacted cerumen, bilateral: Secondary | ICD-10-CM

## 2024-10-13 NOTE — Progress Notes (Signed)
 Patient ID: Brianna Mccarty, female   DOB: 1940-11-20, 83 y.o.   MRN: [id number]  Procedure: Bilateral cerumen disimpaction.   Indication: Cerumen impaction, resulting in ear discomfort and conductive hearing loss.   Description: The patient is placed supine on the operating table. Under the operating microscope, the right ear canal is examined and is noted to be impacted with cerumen. The cerumen is carefully removed with a combination of suction catheters, cerumen curette, and alligator forceps. After the cerumen removal, the ear canal and tympanic membrane are noted to be normal. No middle ear effusion is noted. The same procedure is then repeated on the left side without exception. The patient tolerated the procedure well.  Follow-up care:  The patient will follow up in 6 months.

## 2024-10-15 ENCOUNTER — Ambulatory Visit (HOSPITAL_COMMUNITY): Admitting: Physician Assistant

## 2024-10-26 ENCOUNTER — Other Ambulatory Visit: Payer: Self-pay

## 2024-10-26 MED ORDER — NEXLIZET 180-10 MG PO TABS: 1.0000 | ORAL_TABLET | Freq: Every day | ORAL | 2 refills | Status: AC

## 2024-10-31 ENCOUNTER — Ambulatory Visit
Admission: RE | Admit: 2024-10-31 | Discharge: 2024-10-31 | Disposition: A | Source: Ambulatory Visit | Attending: General Surgery | Admitting: General Surgery

## 2024-10-31 DIAGNOSIS — K862 Cyst of pancreas: Secondary | ICD-10-CM

## 2024-10-31 MED ORDER — GADOPICLENOL 0.5 MMOL/ML IV SOLN
7.5000 mL | Freq: Once | INTRAVENOUS | Status: AC | PRN
  Administered 2024-10-31: 7.5 mL via INTRAVENOUS

## 2024-11-08 ENCOUNTER — Ambulatory Visit: Payer: Self-pay | Admitting: General Surgery

## 2024-11-28 ENCOUNTER — Other Ambulatory Visit: Payer: Self-pay | Admitting: Family Medicine

## 2024-11-29 ENCOUNTER — Other Ambulatory Visit: Payer: Self-pay | Admitting: Family Medicine

## 2024-11-29 DIAGNOSIS — I10 Essential (primary) hypertension: Secondary | ICD-10-CM

## 2024-12-30 ENCOUNTER — Ambulatory Visit (HOSPITAL_COMMUNITY): Admitting: Physician Assistant

## 2025-01-27 ENCOUNTER — Encounter (INDEPENDENT_AMBULATORY_CARE_PROVIDER_SITE_OTHER): Admitting: Ophthalmology

## 2025-02-04 ENCOUNTER — Other Ambulatory Visit (HOSPITAL_COMMUNITY)

## 2025-04-14 ENCOUNTER — Ambulatory Visit (INDEPENDENT_AMBULATORY_CARE_PROVIDER_SITE_OTHER): Admitting: Otolaryngology
